# Patient Record
Sex: Female | Born: 1955
Health system: Southern US, Community
[De-identification: ages and names within clinical notes are randomized; demographics above are authoritative.]

## PROBLEM LIST (undated history)

## (undated) DIAGNOSIS — R0600 Dyspnea, unspecified: Secondary | ICD-10-CM

## (undated) DIAGNOSIS — T7840XA Allergy, unspecified, initial encounter: Secondary | ICD-10-CM

## (undated) DIAGNOSIS — Z5189 Encounter for other specified aftercare: Secondary | ICD-10-CM

## (undated) DIAGNOSIS — K219 Gastro-esophageal reflux disease without esophagitis: Secondary | ICD-10-CM

## (undated) DIAGNOSIS — J189 Pneumonia, unspecified organism: Secondary | ICD-10-CM

## (undated) DIAGNOSIS — H269 Unspecified cataract: Secondary | ICD-10-CM

## (undated) DIAGNOSIS — K529 Noninfective gastroenteritis and colitis, unspecified: Secondary | ICD-10-CM

## (undated) DIAGNOSIS — Z932 Ileostomy status: Secondary | ICD-10-CM

## (undated) DIAGNOSIS — L719 Rosacea, unspecified: Secondary | ICD-10-CM

## (undated) DIAGNOSIS — E119 Type 2 diabetes mellitus without complications: Secondary | ICD-10-CM

## (undated) DIAGNOSIS — E669 Obesity, unspecified: Secondary | ICD-10-CM

## (undated) DIAGNOSIS — I340 Nonrheumatic mitral (valve) insufficiency: Secondary | ICD-10-CM

## (undated) DIAGNOSIS — G4730 Sleep apnea, unspecified: Secondary | ICD-10-CM

## (undated) DIAGNOSIS — G4733 Obstructive sleep apnea (adult) (pediatric): Secondary | ICD-10-CM

## (undated) DIAGNOSIS — D649 Anemia, unspecified: Secondary | ICD-10-CM

## (undated) DIAGNOSIS — R739 Hyperglycemia, unspecified: Secondary | ICD-10-CM

## (undated) DIAGNOSIS — K7460 Unspecified cirrhosis of liver: Secondary | ICD-10-CM

## (undated) DIAGNOSIS — M1990 Unspecified osteoarthritis, unspecified site: Secondary | ICD-10-CM

## (undated) DIAGNOSIS — I10 Essential (primary) hypertension: Secondary | ICD-10-CM

## (undated) DIAGNOSIS — K8020 Calculus of gallbladder without cholecystitis without obstruction: Secondary | ICD-10-CM

## (undated) DIAGNOSIS — K56609 Unspecified intestinal obstruction, unspecified as to partial versus complete obstruction: Secondary | ICD-10-CM

## (undated) DIAGNOSIS — N189 Chronic kidney disease, unspecified: Secondary | ICD-10-CM

## (undated) DIAGNOSIS — Z87442 Personal history of urinary calculi: Secondary | ICD-10-CM

## (undated) HISTORY — DX: Essential (primary) hypertension: I10

## (undated) HISTORY — DX: Obesity, unspecified: E66.9

## (undated) HISTORY — DX: Noninfective gastroenteritis and colitis, unspecified: K52.9

## (undated) HISTORY — DX: Allergy, unspecified, initial encounter: T78.40XA

## (undated) HISTORY — DX: Rosacea, unspecified: L71.9

## (undated) HISTORY — DX: Encounter for other specified aftercare: Z51.89

## (undated) HISTORY — PX: APPENDECTOMY: SHX54

## (undated) HISTORY — DX: Calculus of gallbladder without cholecystitis without obstruction: K80.20

## (undated) HISTORY — DX: Hyperglycemia, unspecified: R73.9

## (undated) HISTORY — DX: Unspecified intestinal obstruction, unspecified as to partial versus complete obstruction: K56.609

## (undated) HISTORY — PX: CHOLECYSTECTOMY: SHX55

## (undated) HISTORY — DX: Unspecified osteoarthritis, unspecified site: M19.90

## (undated) HISTORY — DX: Unspecified cataract: H26.9

## (undated) HISTORY — PX: SPINE SURGERY: SHX786

---

## 1991-01-24 HISTORY — PX: COLECTOMY: SHX59

## 1991-08-24 HISTORY — PX: COLONOSCOPY: SHX174

## 1999-01-11 ENCOUNTER — Other Ambulatory Visit: Admission: RE | Admit: 1999-01-11 | Discharge: 1999-01-11 | Payer: Self-pay | Admitting: Family Medicine

## 1999-02-17 ENCOUNTER — Encounter: Payer: Self-pay | Admitting: Family Medicine

## 1999-02-17 ENCOUNTER — Encounter: Admission: RE | Admit: 1999-02-17 | Discharge: 1999-02-17 | Payer: Self-pay | Admitting: Family Medicine

## 2000-08-07 ENCOUNTER — Encounter: Admission: RE | Admit: 2000-08-07 | Discharge: 2000-08-07 | Payer: Self-pay | Admitting: Family Medicine

## 2000-08-07 ENCOUNTER — Encounter: Payer: Self-pay | Admitting: Family Medicine

## 2000-09-06 ENCOUNTER — Other Ambulatory Visit: Admission: RE | Admit: 2000-09-06 | Discharge: 2000-09-06 | Payer: Self-pay | Admitting: Family Medicine

## 2003-03-12 ENCOUNTER — Other Ambulatory Visit: Admission: RE | Admit: 2003-03-12 | Discharge: 2003-03-12 | Payer: Self-pay | Admitting: Family Medicine

## 2003-06-11 ENCOUNTER — Other Ambulatory Visit: Admission: RE | Admit: 2003-06-11 | Discharge: 2003-06-11 | Payer: Self-pay | Admitting: Family Medicine

## 2004-01-22 ENCOUNTER — Ambulatory Visit: Payer: Self-pay | Admitting: Internal Medicine

## 2004-06-30 ENCOUNTER — Ambulatory Visit: Payer: Self-pay | Admitting: Family Medicine

## 2004-06-30 ENCOUNTER — Other Ambulatory Visit: Admission: RE | Admit: 2004-06-30 | Discharge: 2004-06-30 | Payer: Self-pay | Admitting: Family Medicine

## 2005-04-27 ENCOUNTER — Other Ambulatory Visit: Admission: RE | Admit: 2005-04-27 | Discharge: 2005-04-27 | Payer: Self-pay | Admitting: Family Medicine

## 2005-04-27 ENCOUNTER — Encounter (INDEPENDENT_AMBULATORY_CARE_PROVIDER_SITE_OTHER): Payer: Self-pay | Admitting: Specialist

## 2005-04-27 ENCOUNTER — Ambulatory Visit: Payer: Self-pay | Admitting: Family Medicine

## 2005-05-11 ENCOUNTER — Ambulatory Visit: Payer: Self-pay | Admitting: Family Medicine

## 2006-01-23 DIAGNOSIS — K8020 Calculus of gallbladder without cholecystitis without obstruction: Secondary | ICD-10-CM

## 2006-01-23 HISTORY — DX: Calculus of gallbladder without cholecystitis without obstruction: K80.20

## 2006-02-06 ENCOUNTER — Ambulatory Visit: Payer: Self-pay | Admitting: Family Medicine

## 2006-02-06 LAB — CONVERTED CEMR LAB
Albumin: 3.7 g/dL (ref 3.5–5.2)
BUN: 8 mg/dL (ref 6–23)
Basophils Absolute: 0.2 10*3/uL — ABNORMAL HIGH (ref 0.0–0.1)
Basophils Relative: 2 % — ABNORMAL HIGH (ref 0.0–1.0)
CO2: 26 meq/L (ref 19–32)
Calcium: 9.4 mg/dL (ref 8.4–10.5)
Chloride: 106 meq/L (ref 96–112)
Creatinine, Ser: 1.2 mg/dL (ref 0.4–1.2)
Eosinophils Relative: 5.5 % — ABNORMAL HIGH (ref 0.0–5.0)
GFR calc Af Amer: 61 mL/min
GFR calc non Af Amer: 50 mL/min
Glucose, Bld: 120 mg/dL — ABNORMAL HIGH (ref 70–99)
HCT: 41.6 % (ref 36.0–46.0)
Hemoglobin: 14.3 g/dL (ref 12.0–15.0)
Lymphocytes Relative: 17.6 % (ref 12.0–46.0)
MCHC: 34.5 g/dL (ref 30.0–36.0)
MCV: 92.5 fL (ref 78.0–100.0)
Monocytes Absolute: 0.3 10*3/uL (ref 0.2–0.7)
Monocytes Relative: 3.7 % (ref 3.0–11.0)
Neutro Abs: 5.8 10*3/uL (ref 1.4–7.7)
Neutrophils Relative %: 71.2 % (ref 43.0–77.0)
Phosphorus: 4.4 mg/dL (ref 2.3–4.6)
Platelets: 311 10*3/uL (ref 150–400)
Potassium: 4.2 meq/L (ref 3.5–5.1)
RBC: 4.5 M/uL (ref 3.87–5.11)
RDW: 12.2 % (ref 11.5–14.6)
Sodium: 139 meq/L (ref 135–145)
TSH: 1.65 microintl units/mL (ref 0.35–5.50)
WBC: 8.1 10*3/uL (ref 4.5–10.5)

## 2006-04-06 ENCOUNTER — Other Ambulatory Visit: Admission: RE | Admit: 2006-04-06 | Discharge: 2006-04-06 | Payer: Self-pay | Admitting: Family Medicine

## 2006-04-06 ENCOUNTER — Ambulatory Visit: Payer: Self-pay | Admitting: Family Medicine

## 2006-04-06 ENCOUNTER — Encounter (INDEPENDENT_AMBULATORY_CARE_PROVIDER_SITE_OTHER): Payer: Self-pay | Admitting: Specialist

## 2006-04-06 LAB — CONVERTED CEMR LAB: Pap Smear: NORMAL

## 2006-05-24 HISTORY — PX: TOTAL ABDOMINAL HYSTERECTOMY: SHX209

## 2006-06-02 ENCOUNTER — Emergency Department: Payer: Self-pay | Admitting: Emergency Medicine

## 2006-06-04 ENCOUNTER — Observation Stay: Payer: Self-pay

## 2006-06-04 ENCOUNTER — Encounter: Payer: Self-pay | Admitting: Family Medicine

## 2006-07-10 ENCOUNTER — Encounter: Payer: Self-pay | Admitting: Family Medicine

## 2006-07-12 ENCOUNTER — Telehealth: Payer: Self-pay | Admitting: Family Medicine

## 2006-07-12 DIAGNOSIS — K5190 Ulcerative colitis, unspecified, without complications: Secondary | ICD-10-CM | POA: Insufficient documentation

## 2006-08-06 ENCOUNTER — Ambulatory Visit: Payer: Self-pay | Admitting: Gastroenterology

## 2006-08-10 ENCOUNTER — Ambulatory Visit (HOSPITAL_COMMUNITY): Admission: RE | Admit: 2006-08-10 | Discharge: 2006-08-10 | Payer: Self-pay | Admitting: Gastroenterology

## 2006-11-30 ENCOUNTER — Telehealth: Payer: Self-pay | Admitting: Family Medicine

## 2006-12-12 ENCOUNTER — Encounter: Payer: Self-pay | Admitting: Family Medicine

## 2006-12-12 DIAGNOSIS — N9489 Other specified conditions associated with female genital organs and menstrual cycle: Secondary | ICD-10-CM

## 2006-12-13 ENCOUNTER — Ambulatory Visit: Payer: Self-pay | Admitting: Family Medicine

## 2006-12-13 DIAGNOSIS — G4733 Obstructive sleep apnea (adult) (pediatric): Secondary | ICD-10-CM | POA: Insufficient documentation

## 2006-12-13 DIAGNOSIS — E669 Obesity, unspecified: Secondary | ICD-10-CM

## 2007-02-18 ENCOUNTER — Ambulatory Visit: Payer: Self-pay | Admitting: Gastroenterology

## 2007-03-08 ENCOUNTER — Telehealth: Payer: Self-pay | Admitting: Family Medicine

## 2007-05-15 DIAGNOSIS — I10 Essential (primary) hypertension: Secondary | ICD-10-CM

## 2007-07-29 ENCOUNTER — Telehealth: Payer: Self-pay | Admitting: Family Medicine

## 2007-08-13 ENCOUNTER — Ambulatory Visit: Payer: Self-pay | Admitting: Gastroenterology

## 2007-08-20 ENCOUNTER — Ambulatory Visit: Payer: Self-pay | Admitting: Gastroenterology

## 2007-08-21 ENCOUNTER — Encounter: Payer: Self-pay | Admitting: Family Medicine

## 2007-09-12 ENCOUNTER — Ambulatory Visit: Payer: Self-pay | Admitting: Family Medicine

## 2007-09-12 DIAGNOSIS — L719 Rosacea, unspecified: Secondary | ICD-10-CM | POA: Insufficient documentation

## 2008-09-08 ENCOUNTER — Ambulatory Visit: Payer: Self-pay | Admitting: Family Medicine

## 2008-09-08 DIAGNOSIS — Z9189 Other specified personal risk factors, not elsewhere classified: Secondary | ICD-10-CM

## 2008-09-08 LAB — CONVERTED CEMR LAB
Bilirubin Urine: NEGATIVE
Casts: 0 /lpf
Glucose, Urine, Semiquant: NEGATIVE
Protein, U semiquant: NEGATIVE
Specific Gravity, Urine: 1.01
Urobilinogen, UA: 0.2
WBC, UA: 0 cells/hpf
Yeast, UA: 0
pH: 6

## 2008-09-09 ENCOUNTER — Encounter: Payer: Self-pay | Admitting: Family Medicine

## 2008-09-11 ENCOUNTER — Ambulatory Visit: Payer: Self-pay | Admitting: Cardiology

## 2008-09-14 ENCOUNTER — Telehealth: Payer: Self-pay | Admitting: Family Medicine

## 2008-10-12 ENCOUNTER — Ambulatory Visit: Payer: Self-pay | Admitting: Family Medicine

## 2008-10-13 LAB — CONVERTED CEMR LAB
Albumin: 3.4 g/dL — ABNORMAL LOW (ref 3.5–5.2)
Alkaline Phosphatase: 65 units/L (ref 39–117)
BUN: 9 mg/dL (ref 6–23)
Basophils Absolute: 0 10*3/uL (ref 0.0–0.1)
CO2: 27 meq/L (ref 19–32)
Calcium: 8.7 mg/dL (ref 8.4–10.5)
Cholesterol: 168 mg/dL (ref 0–200)
Eosinophils Relative: 10.4 % — ABNORMAL HIGH (ref 0.0–5.0)
GFR calc non Af Amer: 61.33 mL/min (ref >60)
Glucose, Bld: 112 mg/dL — ABNORMAL HIGH (ref 70–99)
HCT: 44 % (ref 36.0–46.0)
HDL: 35.7 mg/dL — ABNORMAL LOW (ref >39.00)
Hemoglobin: 14.8 g/dL (ref 12.0–15.0)
Lymphocytes Relative: 21.8 % (ref 12.0–46.0)
Lymphs Abs: 1.5 10*3/uL (ref 0.7–4.0)
Monocytes Relative: 7 % (ref 3.0–12.0)
Neutro Abs: 4.1 10*3/uL (ref 1.4–7.7)
Platelets: 245 10*3/uL (ref 150.0–400.0)
RDW: 12.9 % (ref 11.5–14.6)
TSH: 1.64 microintl units/mL (ref 0.35–5.50)
Total Protein: 6.7 g/dL (ref 6.0–8.3)
Triglycerides: 159 mg/dL — ABNORMAL HIGH (ref 0.0–149.0)
VLDL: 31.8 mg/dL (ref 0.0–40.0)
WBC: 6.8 10*3/uL (ref 4.5–10.5)

## 2008-10-16 ENCOUNTER — Ambulatory Visit: Payer: Self-pay | Admitting: Family Medicine

## 2008-10-29 ENCOUNTER — Encounter: Payer: Self-pay | Admitting: Family Medicine

## 2008-11-16 ENCOUNTER — Encounter: Payer: Self-pay | Admitting: Family Medicine

## 2008-11-16 LAB — HM MAMMOGRAPHY: HM Mammogram: NORMAL

## 2009-04-16 ENCOUNTER — Ambulatory Visit: Payer: Self-pay | Admitting: Family Medicine

## 2009-04-23 ENCOUNTER — Ambulatory Visit: Payer: Self-pay | Admitting: Family Medicine

## 2009-04-23 DIAGNOSIS — E1129 Type 2 diabetes mellitus with other diabetic kidney complication: Secondary | ICD-10-CM | POA: Insufficient documentation

## 2009-04-23 DIAGNOSIS — E119 Type 2 diabetes mellitus without complications: Secondary | ICD-10-CM

## 2009-05-28 ENCOUNTER — Ambulatory Visit: Payer: Self-pay | Admitting: Family Medicine

## 2009-05-28 ENCOUNTER — Encounter: Payer: Self-pay | Admitting: Family Medicine

## 2009-06-07 ENCOUNTER — Encounter: Payer: Self-pay | Admitting: Family Medicine

## 2009-07-07 ENCOUNTER — Encounter: Payer: Self-pay | Admitting: Family Medicine

## 2009-07-13 ENCOUNTER — Ambulatory Visit: Payer: Self-pay | Admitting: Family Medicine

## 2009-07-14 LAB — CONVERTED CEMR LAB
Chloride: 109 meq/L (ref 96–112)
Creatinine,U: 288 mg/dL
Glucose, Bld: 126 mg/dL — ABNORMAL HIGH (ref 70–99)
Hgb A1c MFr Bld: 6.1 % (ref 4.6–6.5)
Microalb, Ur: 1 mg/dL (ref 0.0–1.9)
Phosphorus: 4.2 mg/dL (ref 2.3–4.6)
Potassium: 4.8 meq/L (ref 3.5–5.1)
Sodium: 142 meq/L (ref 135–145)

## 2009-07-21 ENCOUNTER — Ambulatory Visit: Payer: Self-pay | Admitting: Family Medicine

## 2009-07-21 DIAGNOSIS — R490 Dysphonia: Secondary | ICD-10-CM | POA: Insufficient documentation

## 2009-07-27 ENCOUNTER — Telehealth: Payer: Self-pay | Admitting: Family Medicine

## 2009-07-30 ENCOUNTER — Telehealth: Payer: Self-pay | Admitting: Family Medicine

## 2009-09-02 ENCOUNTER — Telehealth: Payer: Self-pay | Admitting: Family Medicine

## 2009-09-02 ENCOUNTER — Encounter: Payer: Self-pay | Admitting: Family Medicine

## 2009-09-23 ENCOUNTER — Telehealth: Payer: Self-pay | Admitting: Family Medicine

## 2009-10-07 ENCOUNTER — Ambulatory Visit: Payer: Self-pay | Admitting: Family Medicine

## 2009-10-08 LAB — CONVERTED CEMR LAB
BUN: 14 mg/dL (ref 6–23)
Calcium: 9 mg/dL (ref 8.4–10.5)
Creatinine, Ser: 0.9 mg/dL (ref 0.4–1.2)
GFR calc non Af Amer: 73.71 mL/min (ref >60)
Glucose, Bld: 111 mg/dL — ABNORMAL HIGH (ref 70–99)
Phosphorus: 3.7 mg/dL (ref 2.3–4.6)

## 2009-10-14 ENCOUNTER — Ambulatory Visit: Payer: Self-pay | Admitting: Family Medicine

## 2009-10-14 DIAGNOSIS — M25569 Pain in unspecified knee: Secondary | ICD-10-CM | POA: Insufficient documentation

## 2009-11-11 ENCOUNTER — Encounter: Payer: Self-pay | Admitting: Family Medicine

## 2010-01-07 ENCOUNTER — Telehealth: Payer: Self-pay | Admitting: Family Medicine

## 2010-02-22 NOTE — Assessment & Plan Note (Signed)
Summary: ROA FOR 3 MONTH FOLLOW-UP/JRR   Vital Signs:  Patient profile:   55 year old female Height:      66 inches Weight:      239.25 pounds BMI:     38.76 Temp:     97.9 degrees F oral Pulse rate:   76 / minute Pulse rhythm:   regular BP sitting:   136 / 78  (left arm) Cuff size:   large  Vitals Entered By: Ozzie Hoyle LPN (July 22, 2438 10:27 AM) CC: follow-up visit   History of Present Illness: here for f/u of mild dm/ hyperglycemia   wt is down 5 lb has cut out a lot of sugar  changed to sugar free hard candies , no sweets at all (doing well with that )  watching portions carefully  is hard to get rid of salty and crunchy things , has switched to whole grain items and watching portions  has some work to do still   daily sugars fluctuate fasting - usually low 100s under 120  pm- pp sugars fluctuate more -- but for the most part under 140  learning what foods make it higher   bp 136/78 today is not on ace yet   this lab draw gluc 126 and AIC is 6.1  microalb neg  [facility]   has not started exercise yet  is more active in general but has not started a program  wants to walk and has a wi fit   takes hyomax -- for signs of blockage  this helps with lying down when needed  needs refil of this - and does not see GI on any particular schedule  Last Lipid ProfileCholesterol: 168 (10/12/2008 10:03:48 AM)HDL:  35.70 (10/12/2008 10:03:48 AM)LDL:  101 (10/12/2008 10:03:48 AM)Triglycerides:  Last Liver profileSGOT:  35 (10/12/2008 10:03:48 AM)SPGT:  37 (10/12/2008 10:03:48 AM)T. Bili:  1.1 (10/12/2008 10:03:48 AM)Alk Phos:  65 (10/12/2008 10:03:48 AM)  overall not bad lipids at last check  some recent hoarse voice  has gerd -- takes prilosec as needed -- not daily  occ post nasal drip  does sing in a choir - but not this season   Allergies (verified): No Known Drug Allergies  Past History:  Past Medical History: Last updated:  10/16/2008 Arthritis Gallstones hx of colitis with colectomy Hypertension Obesity repeated bowel obstruction (? poss from adhesions)- neg EGD rosacea mild hyperglycemia   Past Surgical History: Last updated: 10/16/2008 Dexa- normal 901/2001) Admit- gyn, ovarian cysts (05/2006) CT- incidenatal lesion right breast Hysterectomy (05/2006)- total for abcessed ovaries  Colectomy 1993 8/10 ct abd /pelvis -- kidney stone in L kidney, gallstones EGD 09 - normal  Family History: Last updated: 10/16/2008 Father: colon cancer, liver resection secondary to mets, chol, HTN, allergies, chol  Mother: HTN, chol, rare liver problem- elevated enzymes, died of NASH cirrhosis in 03-29-06, DM, chol  Siblings: 1 brother with allergies  Social History: Last updated: 09/12/2007 Marital Status: Married Children: 3 sons 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  Alcohol Use - no Daily Caffeine Use- one soda at lunch  Illicit Drug Use - no Patient does not get regular exercise.   Risk Factors: Exercise: no (08/13/2007)  Risk Factors: Smoking Status: never (08/13/2007)  Review of Systems General:  Denies fatigue, loss of appetite, sleep disorder, and sweats. Eyes:  Denies blurring and eye irritation. ENT:  Complains of hoarseness. CV:  Denies chest pain or discomfort and palpitations. Resp:  Denies cough and wheezing.  GI:  Denies abdominal pain, bloody stools, change in bowel habits, and indigestion. MS:  Denies joint pain and cramps. Derm:  Denies itching, lesion(s), poor wound healing, and rash. Neuro:  Denies numbness and tingling. Psych:  Denies anxiety and depression. Endo:  Denies cold intolerance, excessive thirst, excessive urination, and heat intolerance. Heme:  Denies abnormal bruising and bleeding.  Physical Exam  General:  overweight but generally well appearing  Head:  normocephalic, atraumatic, and no abnormalities observed.   Eyes:  vision grossly intact,  pupils equal, pupils round, and pupils reactive to light.   Nose:  no nasal discharge.   Mouth:  pharynx pink and moist.   mild post nasal drip Neck:  supple with full rom and no masses or thyromegally, no JVD or carotid bruit  Lungs:  Normal respiratory effort, chest expands symmetrically. Lungs are clear to auscultation, no crackles or wheezes. Heart:  Normal rate and regular rhythm. S1 and S2 normal without gallop, murmur, click, rub or other extra sounds. Abdomen:  Bowel sounds positive,abdomen soft and non-tender without masses, organomegaly or hernias noted. no renal bruits  Msk:  No deformity or scoliosis noted of thoracic or lumbar spine.  no acute joint changes  Pulses:  R and L carotid,radial,femoral,dorsalis pedis and posterior tibial pulses are full and equal bilaterally Extremities:  No clubbing, cyanosis, edema, or deformity noted with normal full range of motion of all joints.   Neurologic:  sensation intact to light touch, gait normal, and DTRs symmetrical and normal.   Skin:  Intact without suspicious lesions or rashes Cervical Nodes:  No lymphadenopathy noted Psych:  normal affect, talkative and pleasant   Diabetes Management Exam:    Foot Exam (with socks and/or shoes not present):       Sensory-Pinprick/Light touch:          Left medial foot (L-4): normal          Left dorsal foot (L-5): normal          Left lateral foot (S-1): normal          Right medial foot (L-4): normal          Right dorsal foot (L-5): normal          Right lateral foot (S-1): normal       Sensory-Monofilament:          Left foot: normal          Right foot: normal       Inspection:          Left foot: normal          Right foot: normal       Nails:          Left foot: normal          Right foot: normal   Impression & Recommendations:  Problem # 1:  DIABETES MELLITUS, TYPE II, MILD (ICD-250.00) Assessment Improved  improving with wt loss and better diet  rev sugars as well as  records from DM classes  disc plan now for exercise  will keep up the good work and re check in 3 mo with labs and f/u add ace today for renal protection Her updated medication list for this problem includes:    Lisinopril 5 Mg Tabs (Lisinopril) .Marland Kitchen... 1 by mouth once daily in am  Labs Reviewed: Creat: 1.0 (07/13/2009)     Last Eye Exam: normal (11/23/2008) Reviewed HgBA1c results: 6.1 (07/13/2009)  6.2 (04/16/2009)  Orders: Prescription Created  Electronically [phone])  Problem # 2:  HYPERTENSION (ICD-401.9) Assessment: Unchanged  still a bit high - disc minimizing otc meds that raise bp (ie : D products and nsaids)  will add ace low dose  lab and f/u in 3 mo adv to keep working on wt loss and start exercise  Her updated medication list for this problem includes:    Metoprolol Tartrate 25 Mg Tabs (Metoprolol tartrate) .Marland Kitchen... Take one by mouth daily    Lisinopril 5 Mg Tabs (Lisinopril) .Marland Kitchen... 1 by mouth once daily in am  BP today: 136/78 Prior BP: 135/80 (04/23/2009)  Labs Reviewed: K+: 4.8 (07/13/2009) Creat: : 1.0 (07/13/2009)   Chol: 168 (10/12/2008)   HDL: 35.70 (10/12/2008)   LDL: 101 (10/12/2008)   TG: 159.0 (10/12/2008)  Orders: Prescription Created Electronically [phone])  Problem # 3:  COLITIS, ULCERATIVE NOS (ICD-556.9) Assessment: Unchanged  with occ cramps and bowel obst  refil hyomax today- update if worse   Orders: Prescription Created Electronically [phone])  Problem # 4:  HOARSENESS (TIR-443.15) Assessment: New  disc diff dx of this incl post nasal drip /' voice overuse and vocal polyps  most likely rel to gerd  asked pt to inc prilosec to 20 mg daily if not imp in 2 weeks needs to call back for ENT consult for further eval   Orders: Prescription Created Electronically [phone])  Complete Medication List: 1)  Ambien 10 Mg Tabs (Zolpidem tartrate) .... 1/2 to 1 by mouth at bedtime as needed 2)  Flovent Hfa 220 Mcg/act Aero (Fluticasone propionate   hfa) .... As directed 3)  Metoprolol Tartrate 25 Mg Tabs (Metoprolol tartrate) .... Take one by mouth daily 4)  Aleve 220 Mg Tabs (Naproxen sodium) .... One by mouth once daily 5)  Hyomax-sl 0.125 Mg Subl (Hyoscyamine sulfate) .... Take as needed 1 sublingual for abdominal cramps 6)  Mucinex D 60-600 Mg Xr12h-tab (Pseudoephedrine-guaifenesin) .... Otc as directed. 7)  Glucometer  .... To check sugar once daily and as needed for dm2   250.0 8)  Glucose Test Strips  .... To check sugar once daily and as needed for dm2   250.0 9)  Cinnamon 500 Mg Tabs (Cinnamon) .... Take 2 tablets  by mouth once a day 10)  Lisinopril 5 Mg Tabs (Lisinopril) .Marland Kitchen.. 1 by mouth once daily in am 11)  Prilosec Otc 20 Mg Tbec (Omeprazole magnesium) .Marland Kitchen.. 1 by mouth once daily  Patient Instructions: 1)  start taking prilosec 20 mg every day -- and if hoarseness is not better in 2 weeks call for ENT consult  2)  start lisinopril- if cough or any other side effect- stop it and call  3)  keep working hard on diet and exercise  4)  schedule lab and follow up in 3 months -- AIC and renal 250.0 then follow up  5)  I sent px to your pharmacy Prescriptions: LISINOPRIL 5 MG TABS (LISINOPRIL) 1 by mouth once daily in am  #30 x 11   Entered and Authorized by:   Allena Earing MD   Signed by:   Allena Earing MD on 07/21/2009   Method used:   Electronically to        [facility]. # [phone]* (retail)       [address]       [place], [place]  [address]       Ph: [phone]       Fax:  [phone]   RxID:   [id number] HYOMAX-SL 0.125 MG  SUBL (HYOSCYAMINE SULFATE) take as needed 1 sublingual for abdominal cramps  #30 x 3   Entered and Authorized by:   Allena Earing MD   Signed by:   Allena Earing MD on 07/21/2009   Method used:   Electronically to        [facility]. # [id number]* (retail)       [address]       [place], [place]  [address]       Ph: [phone]       Fax:  [phone]   RxID:   [id number]   Current Allergies (reviewed today): No known allergies

## 2010-02-22 NOTE — Consult Note (Signed)
Summary: [place]   Imported By: Edmonia James 06/04/2009 12:33:06  _____________________________________________________________________  External Attachment:    Type:   Image     Comment:   External Document

## 2010-02-22 NOTE — Progress Notes (Signed)
Summary: ? About BP med  Phone Note Call from Patient Call back at [phone]   Caller: Patient Call For: Allena Earing MD Summary of Call: Patient started Lisinopril last week and is having symtoms. Patient states that she took it for 2 days and she was completely wiped out and stomach was hurting. Patient took her BP Friday night and it was 101/52. Patient stated that she started cutting the pill in half on Saturday and is doing a little better, but not a lot. Patient took her BP yesterday and it was 124/77. Patient states that she is still wiped out and stomach hurts with being on 1/2 of the pill. Please advise. Pharmacy/[facility]. Initial call taken by: Emelia Salisbury LPN,  July 27, 9572 7:34 PM  Follow-up for Phone Call        stop med for 3 days then call back with how she is feeling- then will make a plan Follow-up by: Allena Earing MD,  July 27, 2009 4:38 PM  Additional Follow-up for Phone Call Additional follow up Details #1::        Patient notified as instructed by telephone. Ozzie Hoyle LPN  July 27, 368 9:64 PM     New/Updated Medications: LISINOPRIL 5 MG TABS (LISINOPRIL) 1 by mouth once daily in am (holding for stomach ache and fatigue july 5)

## 2010-02-22 NOTE — Progress Notes (Signed)
Summary: lisinopril  Phone Note Call from Patient Call back at [phone]   Caller: Patient Call For: Allena Earing MD Summary of Call: Patient called to let you know that after stopping the Lisinopril for 3 days, she feels great and all the symptoms that she was having are gone. She is asking what she should do now?  Initial call taken by: Lacretia Nicks,  July 30, 2009 2:53 PM  Follow-up for Phone Call        I'm going to hold off on any more med until I see her for next f/u I'm glad she is feeling better  Follow-up by: Allena Earing MD,  July 30, 2009 3:17 PM  Additional Follow-up for Phone Call Additional follow up Details #1::        Patient notified as instructed by telephone. Ozzie Hoyle LPN  July 31, 9526 4:13 PM    New Allergies: ! * LISINOPRIL New Allergies: ! * LISINOPRIL

## 2010-02-22 NOTE — Miscellaneous (Signed)
Summary: Flu vaccine   Clinical Lists Changes  Observations: Added new observation of FLU VAX: Historical by [facility] (11/04/2009 15:59)      Immunization History:  Influenza Immunization History:    Influenza:  historical by [facility] (11/04/2009)

## 2010-02-22 NOTE — Assessment & Plan Note (Signed)
Summary: ROA 3 MTHS F/ U ON LABS CYD   Vital Signs:  Patient profile:   55 year old female Height:      66 inches Weight:      239.50 pounds BMI:     38.80 Temp:     98 degrees F oral Pulse rate:   72 / minute Pulse rhythm:   regular BP sitting:   128 / 82  (left arm) Cuff size:   large  Vitals Entered By: Ozzie Hoyle LPN (October 15, 7670 8:15 AM) CC: three month f/u   History of Present Illness: here for f/u of hyperglycemia and HTN and hoarseness/ gerd  has been feeling ok overall  stomach still does not feel good 30 min or so after she eats -- is a heavy feeling  no cramping or signs of obstruction  no acid reflux  still take prilosec -- but not regularly she plans to watch this  no hx of celiac or lactose intolerance and no change from diff foods   renal panel shows gluc111 AIC is 6.2 up from 6.1 fasting avg 111 and 138 2 hours pp -- overall fairly good  did not monitor eating as carefully-- is eating less sweets / none  eats too many salty snacks , crunchy  occ peanuts and cheerios together   exercise - has not done  plan would be to walk  could walk in evenings -- 7 pm - for 1/2 hour 4 days per week   wt is stable   bp is better at 128/82- staying away from decongestants  still takes aleve once per day  knees and ankles hurt with stairs  ? if aleve bothers stomach  could she try something else   hoarseness got better then worse with a cold  sounds fine today    Allergies: 1)  ! * Lisinopril  Past History:  Past Medical History: Last updated: 10/16/2008 Arthritis Gallstones hx of colitis with colectomy Hypertension Obesity repeated bowel obstruction (? poss from adhesions)- neg EGD rosacea mild hyperglycemia   Past Surgical History: Last updated: 10/16/2008 Dexa- normal 901/2001) Admit- gyn, ovarian cysts (05/2006) CT- incidenatal lesion right breast Hysterectomy (05/2006)- total for abcessed ovaries  Colectomy 1993 8/10 ct abd  /pelvis -- kidney stone in L kidney, gallstones EGD 09 - normal  Family History: Last updated: 10/16/2008 Father: colon cancer, liver resection secondary to mets, chol, HTN, allergies, chol  Mother: HTN, chol, rare liver problem- elevated enzymes, died of NASH cirrhosis in 2006/03/24, DM, chol  Siblings: 1 brother with allergies  Social History: Last updated: 09/12/2007 Marital Status: Married Children: 3 sons 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  Alcohol Use - no Daily Caffeine Use- one soda at lunch  Illicit Drug Use - no Patient does not get regular exercise.   Risk Factors: Exercise: no (08/13/2007)  Risk Factors: Smoking Status: never (08/13/2007)  Review of Systems General:  Denies fatigue, loss of appetite, and malaise. Eyes:  Denies blurring and eye irritation. ENT:  Complains of hoarseness. CV:  Denies chest pain or discomfort, lightheadness, and palpitations. Resp:  Complains of cough. GI:  Complains of indigestion; denies change in bowel habits and nausea. GU:  Denies dysuria. Derm:  Denies itching, lesion(s), poor wound healing, and rash. Neuro:  Denies numbness and tingling. Psych:  Denies anxiety and depression. Endo:  Denies cold intolerance, excessive thirst, excessive urination, and heat intolerance.  Physical Exam  General:  Well-developed,well-nourished,in no acute distress; alert,appropriate and cooperative throughout  examination Head:  normocephalic, atraumatic, and no abnormalities observed.   Eyes:  vision grossly intact, pupils equal, pupils round, and pupils reactive to light.   Mouth:  pharynx pink and moist.   Neck:  supple with full rom and no masses or thyromegally, no JVD or carotid bruit  Chest Wall:  No deformities, masses, or tenderness noted. Lungs:  Normal respiratory effort, chest expands symmetrically. Lungs are clear to auscultation, no crackles or wheezes. Heart:  Normal rate and regular rhythm. S1 and S2 normal  without gallop, murmur, click, rub or other extra sounds. Abdomen:  Bowel sounds positive,abdomen soft and non-tender without masses, organomegaly or hernias noted. no renal bruits  Msk:  some pain on full rom of knees -- no swelling or eff or crepitice Pulses:  R and L carotid,radial,femoral,dorsalis pedis and posterior tibial pulses are full and equal bilaterally Extremities:  No clubbing, cyanosis, edema, or deformity noted with normal full range of motion of all joints.   Neurologic:  sensation intact to light touch, gait normal, and DTRs symmetrical and normal.   Skin:  Intact without suspicious lesions or rashes Cervical Nodes:  No lymphadenopathy noted Psych:  normal affect, talkative and pleasant   Diabetes Management Exam:    Foot Exam (with socks and/or shoes not present):       Sensory-Pinprick/Light touch:          Left medial foot (L-4): normal          Left dorsal foot (L-5): normal          Left lateral foot (S-1): normal          Right medial foot (L-4): normal          Right dorsal foot (L-5): normal          Right lateral foot (S-1): normal       Sensory-Monofilament:          Left foot: normal          Right foot: normal       Inspection:          Left foot: normal          Right foot: normal       Nails:          Left foot: normal          Right foot: normal   Impression & Recommendations:  Problem # 1:  HOARSENESS (KPQ-244.97) Assessment Improved  this is better with prilosec - urged her to take it daily and update me   Orders: Prescription Created Electronically [id number])  Problem # 2:  DIABETES MELLITUS, TYPE II, MILD (ICD-250.00) Assessment: Unchanged  this is stable -- hyperglycemia disc healthy diet (low simple sugar/ choose complex carbs/ low sat fat) diet and exercise in detail  plan for exercise made  lab 6 mo and f/u  Labs Reviewed: Creat: 0.9 (10/07/2009)     Last Eye Exam: normal (11/23/2008) Reviewed HgBA1c results: 6.2 (10/07/2009)   6.1 (07/13/2009)  Problem # 3:  OTHER SCREENING MAMMOGRAM (ICD-V76.12) Assessment: Comment Only schedule annual mam Orders: Radiology Referral (Radiology)  Problem # 4:  HYPERTENSION (ICD-401.9) Assessment: Improved  this is improved  no change in med ace intol Her updated medication list for this problem includes:    Metoprolol Tartrate 25 Mg Tabs (Metoprolol tartrate) .Marland Kitchen... Take one by mouth daily  BP today: 128/82 Prior BP: 136/78 (07/21/2009)  Labs Reviewed: K+: 4.5 (10/07/2009) Creat: : 0.9 (10/07/2009)   Chol: 168 (  10/12/2008)   HDL: 35.70 (10/12/2008)   LDL: 101 (10/12/2008)   TG: 159.0 (10/12/2008)  Orders: Prescription Created Electronically [phone])  Problem # 5:  KNEE PAIN (ZWC-585.27) Assessment: New  poss rel to obesity - disc wt loss trial of low dose mobic instead of aleve to see if easier on GI system The following medications were removed from the medication list:    Aleve 220 Mg Tabs (Naproxen sodium) ..... One by mouth once daily Her updated medication list for this problem includes:    Mobic 7.5 Mg Tabs (Meloxicam) .Marland Kitchen... 1 by mouth once daily with food as needed pain  Orders: Prescription Created Electronically [phone])  Complete Medication List: 1)  Ambien 10 Mg Tabs (Zolpidem tartrate) .... 1/2 to 1 by mouth at bedtime as needed 2)  Flovent Hfa 220 Mcg/act Aero (Fluticasone propionate  hfa) .... 2 sprays to colostomy site once daily as directed 3)  Metoprolol Tartrate 25 Mg Tabs (Metoprolol tartrate) .... Take one by mouth daily 4)  Hyomax-sl 0.125 Mg Subl (Hyoscyamine sulfate) .... Take as needed 1 sublingual for abdominal cramps 5)  Mucinex D 60-600 Mg Xr12h-tab (Pseudoephedrine-guaifenesin) .... Otc as directed. 6)  Glucometer  .... To check sugar once daily and as needed for dm2   250.0 7)  Glucose Test Strips  .... To check sugar once daily and as needed for dm2   250.0 8)  Cinnamon 500 Mg Tabs (Cinnamon) .... Take 2 tablets  by mouth once a  day 9)  Prilosec Otc 20 Mg Tbec (Omeprazole magnesium) .Marland Kitchen.. 1 by mouth once daily 10)  Mobic 7.5 Mg Tabs (Meloxicam) .Marland Kitchen.. 1 by mouth once daily with food as needed pain  Patient Instructions: 1)  sugar and blood pressure are stable  2)  work on weight loss- cut snacks and also add 30 minutes of walking 4 days per week in evening  3)  no change in medicine except try mobic instead of aleve  4)  prilosec daily -- keep track of stomach symptoms 5)  update me if hoarseness returns  6)  get flu shot at work  7)  schedule fasting labs in 6 months lipid/ast/alt/ AIC / renal hyperglycemia and 272 and 401.1 and then f/u  Prescriptions: MOBIC 7.5 MG TABS (MELOXICAM) 1 by mouth once daily with food as needed pain  #30 x 11   Entered and Authorized by:   Allena Earing MD   Signed by:   Allena Earing MD on 10/14/2009   Method used:   Electronically to        [facility]. # [phone]* (retail)       [address]       [place], [place]  [address]       Ph: [phone]       Fax: [phone]   RxID:   [id number]   Current Allergies (reviewed today): ! * LISINOPRIL

## 2010-02-22 NOTE — Assessment & Plan Note (Signed)
Summary: FOLLOWUP AFTER LABS/RI   Vital Signs:  Patient profile:   55 year old female Height:      66 inches Weight:      244.25 pounds BMI:     39.57 Temp:     98.4 degrees F oral Pulse rate:   80 / minute Pulse rhythm:   regular BP sitting:   135 / 80  (left arm) Cuff size:   large  Vitals Entered By: Ozzie Hoyle LPN (April 23, 3152 0:08 PM)  Serial Vital Signs/Assessments:  Time      Position  BP       Pulse  Resp  Temp     By 31                  135/80                         Allena Earing MD  CC: followup after labs   History of Present Illness: here for f/u after checking AIc  is feeling ok overall   hx of hyperglycemia and AIc is 6.2  wt is up 5 lb  doing miserably with diet  eating everything and not watching sugar got very stressed at work   since getting her labs -- is better   thinks the emotional eating is something she has control over    bp up today  146/94-- re check is better after rest -- 135/80  was on wt watchers   no numbness no frequencey of urination or inc thirst   opthy in nov - Dr Precious Bard -  no probs on dilated eye exam   Allergies (verified): No Known Drug Allergies  Past History:  Past Medical History: Last updated: 10/16/2008 Arthritis Gallstones hx of colitis with colectomy Hypertension Obesity repeated bowel obstruction (? poss from adhesions)- neg EGD rosacea mild hyperglycemia   Past Surgical History: Last updated: 10/16/2008 Dexa- normal 901/2001) Admit- gyn, ovarian cysts (05/2006) CT- incidenatal lesion right breast Hysterectomy (05/2006)- total for abcessed ovaries  Colectomy 1993 8/10 ct abd /pelvis -- kidney stone in L kidney, gallstones EGD 09 - normal  Family History: Last updated: 10/16/2008 Father: colon cancer, liver resection secondary to mets, chol, HTN, allergies, chol  Mother: HTN, chol, rare liver problem- elevated enzymes, died of NASH cirrhosis in 04/09/2006, DM, chol  Siblings: 1 brother with  allergies  Social History: Last updated: 09/12/2007 Marital Status: Married Children: 3 sons 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  Alcohol Use - no Daily Caffeine Use- one soda at lunch  Illicit Drug Use - no Patient does not get regular exercise.   Risk Factors: Exercise: no (08/13/2007)  Risk Factors: Smoking Status: never (08/13/2007)  Review of Systems General:  Denies fatigue, fever, loss of appetite, and malaise. Eyes:  Denies blurring and eye irritation. CV:  Denies chest pain or discomfort, lightheadness, palpitations, and shortness of breath with exertion. Resp:  Denies cough, shortness of breath, and wheezing. GI:  Denies change in bowel habits, indigestion, and nausea. GU:  Denies urinary frequency. MS:  Denies muscle aches. Derm:  Denies itching, lesion(s), poor wound healing, and rash. Neuro:  Denies numbness and tingling. Endo:  Denies cold intolerance, excessive thirst, excessive urination, and heat intolerance. Heme:  Denies abnormal bruising and bleeding.  Physical Exam  General:  overweight but generally well appearing  Head:  normocephalic, atraumatic, and no abnormalities observed.   Eyes:  vision grossly intact, pupils  equal, pupils round, and pupils reactive to light.  no conjunctival pallor, injection or icterus  Mouth:  pharynx pink and moist.   Neck:  supple with full rom and no masses or thyromegally, no JVD or carotid bruit  Lungs:  Normal respiratory effort, chest expands symmetrically. Lungs are clear to auscultation, no crackles or wheezes. Heart:  Normal rate and regular rhythm. S1 and S2 normal without gallop, murmur, click, rub or other extra sounds. Abdomen:  Bowel sounds positive,abdomen soft and non-tender without masses, organomegaly or hernias noted. no renal bruits  Msk:  No deformity or scoliosis noted of thoracic or lumbar spine.  no acute joint changes  Pulses:  R and L carotid,radial,femoral,dorsalis pedis and  posterior tibial pulses are full and equal bilaterally Extremities:  No clubbing, cyanosis, edema, or deformity noted with normal full range of motion of all joints.   Neurologic:  sensation intact to light touch, gait normal, and DTRs symmetrical and normal.   Skin:  Intact without suspicious lesions or rashes Cervical Nodes:  No lymphadenopathy noted Axillary Nodes:  No palpable lymphadenopathy Psych:  normal affect, talkative and pleasant   Diabetes Management Exam:    Foot Exam (with socks and/or shoes not present):       Sensory-Pinprick/Light touch:          Left medial foot (L-4): normal          Left dorsal foot (L-5): normal          Left lateral foot (S-1): normal          Right medial foot (L-4): normal          Right dorsal foot (L-5): normal          Right lateral foot (S-1): normal       Sensory-Monofilament:          Left foot: normal          Right foot: normal       Inspection:          Left foot: normal          Right foot: normal       Nails:          Left foot: normal          Right foot: normal    Eye Exam:       Eye Exam done elsewhere          Date: 11/23/2008          Results: normal          Done by: Dr Chip Boer   Impression & Recommendations:  Problem # 1:  DIABETES MELLITUS, TYPE II, MILD (ICD-250.00) Assessment New disc imp of diet/exercise/wt loss handouts given from [facility] on DM  ref to Dm teaching px for equiptment to check glucose once daily f/u after labs 3 mo  opthy up to date disc foot care  will need to work on tighter bp control if not imp next time- disc goals for HTN and lipids Orders: Diabetic Clinic Referral (Diabetic)  Problem # 2:  HYPERTENSION (ICD-401.9) Assessment: Unchanged bp ok but not at goal for DM will likely add ace at next visit for this and renal protection Her updated medication list for this problem includes:    Metoprolol Tartrate 25 Mg Tabs (Metoprolol tartrate) .Marland Kitchen... Take one by mouth daily  BP today:  135/80 Prior BP: 120/82 (10/16/2008)  Labs Reviewed: K+: 4.3 (10/12/2008) Creat: : 1.0 (10/12/2008)   Chol:  168 (10/12/2008)   HDL: 35.70 (10/12/2008)   LDL: 101 (10/12/2008)   TG: 159.0 (10/12/2008)  Complete Medication List: 1)  Ambien 10 Mg Tabs (Zolpidem tartrate) .... 1/2 to 1 by mouth at bedtime prn 2)  Flovent Hfa 220 Mcg/act Aero (Fluticasone propionate  hfa) .... As directed 3)  Metoprolol Tartrate 25 Mg Tabs (Metoprolol tartrate) .... Take one by mouth daily 4)  Aleve 220 Mg Tabs (Naproxen sodium) .... One by mouth once daily 5)  Hyomax-sl 0.125 Mg Subl (Hyoscyamine sulfate) .... As needed 6)  Mucinex D 60-600 Mg Xr12h-tab (Pseudoephedrine-guaifenesin) .... Otc as directed. 7)  Glucometer  .... To check sugar once daily and as needed for dm2   250.0 8)  Glucose Test Strips  .... To check sugar once daily and as needed for dm2   250.0  Patient Instructions: 1)  we will refer you to diabetec teaching at check out  2)  hold on to your px for test equiptment until your first class  3)  please watch diet closely for simple sugars and sweet drinks  4)  eat brown high fiber items instead of white ones  5)  small carbohydrate portions with lots of green vegetables and lean protien  6)  exercise as you can  7)  schedule non fasting labs for AIC , microalbumin and renal 250.0 and then follow up  Prescriptions: GLUCOSE TEST STRIPS to check sugar once daily and as needed for DM2   250.0  #100 each x 3   Entered and Authorized by:   Allena Earing MD   Signed by:   Allena Earing MD on 04/23/2009   Method used:   Print then Give to Patient   RxID:   [id number] GLUCOMETER to check sugar once daily and as needed for DM2   250.0  #1 x 0    Entered and Authorized by:   Allena Earing MD   Signed by:   Allena Earing MD on 04/23/2009   Method used:   Print then Give to Patient   RxID:   [id number]   Current Allergies (reviewed today): No known allergies

## 2010-02-22 NOTE — Progress Notes (Signed)
Summary: Flovent inhaler  Phone Note Refill Request Call back at [phone] Message from:  Robinson on September 23, 2009 12:56 PM  Refills Requested: Medication #1:  FLOVENT HFA 220 MCG/ACT AERO as directed   Last Refilled: 08/24/2009 [facility] [address] [phone] electronically request refill for flovent. Med list said last refill was 2008 by Billie bean? Please advise.    Method Requested: Telephone to Pharmacy Initial call taken by: Ozzie Hoyle LPN,  September 23, 8099 12:57 PM  Follow-up for Phone Call        px written on EMR for call in instructions were vague -- so I changed to 1 inhal two times a day  let me know if that is not correct  Follow-up by: Allena Earing MD,  September 23, 2009 1:04 PM  Additional Follow-up for Phone Call Additional follow up Details #1::        Spoke with pt about insturctions of use and pt said the instructions are OK to be called in but [facility] had suggested pt use flovent with when itching at colostomy site. Pt does 2 sprays on skin when changes colostomy bag. Pt said she had discussed with you before. Medication phoned [place] [phone] pharmacy as instructed. Ozzie Hoyle LPN  September 24, 5850 2:56 PM     Additional Follow-up for Phone Call Additional follow up Details #2::    thank you!- I will change instructions please disregard previous px  Follow-up by: Allena Earing MD,  September 24, 2009 8:01 AM  Additional Follow-up for Phone Call Additional follow up Details #3:: Details for Additional Follow-up Action Taken: Corrected script sent to  pharmacy.           Marty Heck CMA  September 24, 2009 8:58 AM   New/Updated Medications: FLOVENT HFA 220 MCG/ACT AERO (FLUTICASONE PROPIONATE  HFA) 1 inhalation two times a day FLOVENT HFA 220 MCG/ACT AERO (FLUTICASONE PROPIONATE  HFA) 2 sprays to colostomy site once daily as directed Prescriptions: FLOVENT HFA 220 MCG/ACT AERO (FLUTICASONE PROPIONATE  HFA) 2 sprays to  colostomy site once daily as directed  #1 mdi x 11   Entered by:   Marty Heck CMA   Authorized by:   Allena Earing MD   Signed by:   Marty Heck CMA on 09/24/2009   Method used:   Electronically to        [facility]. [address] [phone]* (retail)       [place], [place]  [id number]       Ph: [phone]       Fax: [phone]   RxID:   [id number] FLOVENT HFA 220 MCG/ACT AERO (FLUTICASONE PROPIONATE  HFA) 1 inhalation two times a day  #1 mdi x 5   Entered and Authorized by:   Allena Earing MD   Signed by:   Ozzie Hoyle LPN on 76/73/4193   Method used:   Telephoned to ...       [facility] [address] [phone]* (retail)       [address] [place], [place]  [id number]       Ph: [phone]       Fax: [phone]   [place]:   [phone]

## 2010-02-22 NOTE — Progress Notes (Signed)
Summary: form for gauze  Phone Note From Pharmacy   Caller: [facility] Summary of Call: Form for gauze, for ostomy care, is on your shelf.  Please sign. Initial call taken by: Marty Heck CMA,  September 02, 2009 10:49 AM  Follow-up for Phone Call        done in IN box Follow-up by: Allena Earing MD,  September 02, 2009 12:56 PM  Additional Follow-up for Phone Call Additional follow up Details #1::        completed form faxed to [phone]. Form to be scanned.Ozzie Hoyle LPN  September 02, 9100 3:43 PM

## 2010-02-22 NOTE — Letter (Signed)
Summary: [facility]   Imported By: Edmonia James 07/16/2009 10:35:37  _____________________________________________________________________  External Attachment:    Type:   Image     Comment:   External Document

## 2010-02-22 NOTE — Letter (Signed)
Summary: [facility] for [facility] Supplies/[facility]  CMN for Ostomy Supplies/[facility]   Imported By: Edmonia James 09/09/2009 08:27:27  _____________________________________________________________________  External Attachment:    Type:   Image     Comment:   External Document

## 2010-02-22 NOTE — Letter (Signed)
Summary: [facility]  [facility]   Imported By: Edmonia James 06/11/2009 13:16:55  _____________________________________________________________________  External Attachment:    Type:   Image     Comment:   External Document

## 2010-02-24 NOTE — Progress Notes (Signed)
Summary: sinus infection   Phone Note Call from Patient Call back at Home Phone [phone] Call back at [phone]   Caller: Patient Call For: Allena Earing MD Summary of Call: Patient went to an Urgent Care a couple of weeks ago with a sinus infection. She has been taking augmentin for this and is on her 7th day now. She says that she now has a yeast infection, has alot of burning and itching. She is asking if you could call something in for this. Uses [facility] in [place].  Initial call taken by: Lacretia Nicks,  January 07, 2010 11:08 AM  Follow-up for Phone Call        px written on EMR for call in diflucan f/u if not imp Follow-up by: Allena Earing MD,  January 07, 2010 12:12 PM  Additional Follow-up for Phone Call Additional follow up Details #1::        Med sent electronically to [facility] in [place] .Patient notified as instructed by telephone. Ozzie Hoyle LPN  January 08, 7543 12:54 PM     New/Updated Medications: FLUCONAZOLE 150 MG TABS (FLUCONAZOLE) 1 by mouth once for yeast infection Prescriptions: FLUCONAZOLE 150 MG TABS (FLUCONAZOLE) 1 by mouth once for yeast infection  #1 x 0   Entered by:   Ozzie Hoyle LPN   Authorized by:   Allena Earing MD   Signed by:   Ozzie Hoyle LPN on 92/01/69   Method used:   Electronically to        [facility]. # [phone]* (retail)       [name]       [place], [place]  [address]       Ph: [phone]       Fax: [phone]   RxID:   [id number]

## 2010-03-02 ENCOUNTER — Encounter: Payer: Self-pay | Admitting: Family Medicine

## 2010-03-02 DIAGNOSIS — K8020 Calculus of gallbladder without cholecystitis without obstruction: Secondary | ICD-10-CM

## 2010-03-02 DIAGNOSIS — I10 Essential (primary) hypertension: Secondary | ICD-10-CM

## 2010-03-02 DIAGNOSIS — E669 Obesity, unspecified: Secondary | ICD-10-CM

## 2010-03-02 DIAGNOSIS — Z8719 Personal history of other diseases of the digestive system: Secondary | ICD-10-CM | POA: Insufficient documentation

## 2010-03-02 DIAGNOSIS — R739 Hyperglycemia, unspecified: Secondary | ICD-10-CM

## 2010-03-02 DIAGNOSIS — M1990 Unspecified osteoarthritis, unspecified site: Secondary | ICD-10-CM | POA: Insufficient documentation

## 2010-03-02 DIAGNOSIS — K529 Noninfective gastroenteritis and colitis, unspecified: Secondary | ICD-10-CM

## 2010-03-02 DIAGNOSIS — K56609 Unspecified intestinal obstruction, unspecified as to partial versus complete obstruction: Secondary | ICD-10-CM

## 2010-04-14 ENCOUNTER — Other Ambulatory Visit: Payer: Self-pay

## 2010-04-21 ENCOUNTER — Ambulatory Visit: Payer: Self-pay | Admitting: Family Medicine

## 2010-04-22 ENCOUNTER — Other Ambulatory Visit: Payer: Self-pay | Admitting: Family Medicine

## 2010-04-22 DIAGNOSIS — E7800 Pure hypercholesterolemia, unspecified: Secondary | ICD-10-CM

## 2010-04-22 DIAGNOSIS — I10 Essential (primary) hypertension: Secondary | ICD-10-CM

## 2010-04-25 ENCOUNTER — Other Ambulatory Visit (INDEPENDENT_AMBULATORY_CARE_PROVIDER_SITE_OTHER): Payer: BC Managed Care – PPO | Admitting: Family Medicine

## 2010-04-25 DIAGNOSIS — E785 Hyperlipidemia, unspecified: Secondary | ICD-10-CM

## 2010-04-25 DIAGNOSIS — E7800 Pure hypercholesterolemia, unspecified: Secondary | ICD-10-CM

## 2010-04-25 DIAGNOSIS — I10 Essential (primary) hypertension: Secondary | ICD-10-CM

## 2010-04-25 LAB — RENAL FUNCTION PANEL
CO2: 27 mEq/L (ref 19–32)
Calcium: 8.9 mg/dL (ref 8.4–10.5)
Chloride: 99 mEq/L (ref 96–112)
Creatinine, Ser: 0.7 mg/dL (ref 0.4–1.2)
Sodium: 138 mEq/L (ref 135–145)

## 2010-04-25 LAB — LIPID PANEL
Cholesterol: 185 mg/dL (ref 0–200)
Total CHOL/HDL Ratio: 4
VLDL: 42.2 mg/dL — ABNORMAL HIGH (ref 0.0–40.0)

## 2010-04-25 LAB — ALT: ALT: 33 U/L (ref 0–35)

## 2010-04-25 LAB — LDL CHOLESTEROL, DIRECT: Direct LDL: 110.3 mg/dL

## 2010-04-26 ENCOUNTER — Ambulatory Visit: Payer: Self-pay | Admitting: Family Medicine

## 2010-05-02 ENCOUNTER — Ambulatory Visit (INDEPENDENT_AMBULATORY_CARE_PROVIDER_SITE_OTHER): Payer: BC Managed Care – PPO | Admitting: Family Medicine

## 2010-05-02 ENCOUNTER — Encounter: Payer: Self-pay | Admitting: Family Medicine

## 2010-05-02 DIAGNOSIS — F502 Bulimia nervosa: Secondary | ICD-10-CM

## 2010-05-02 DIAGNOSIS — R7309 Other abnormal glucose: Secondary | ICD-10-CM

## 2010-05-02 DIAGNOSIS — I10 Essential (primary) hypertension: Secondary | ICD-10-CM

## 2010-05-02 DIAGNOSIS — L708 Other acne: Secondary | ICD-10-CM

## 2010-05-02 DIAGNOSIS — R4589 Other symptoms and signs involving emotional state: Secondary | ICD-10-CM

## 2010-05-02 DIAGNOSIS — F5089 Other specified eating disorder: Secondary | ICD-10-CM | POA: Insufficient documentation

## 2010-05-02 DIAGNOSIS — L719 Rosacea, unspecified: Secondary | ICD-10-CM

## 2010-05-02 DIAGNOSIS — E669 Obesity, unspecified: Secondary | ICD-10-CM

## 2010-05-02 DIAGNOSIS — L700 Acne vulgaris: Secondary | ICD-10-CM

## 2010-05-02 DIAGNOSIS — F439 Reaction to severe stress, unspecified: Secondary | ICD-10-CM | POA: Insufficient documentation

## 2010-05-02 MED ORDER — METRONIDAZOLE 1 % EX GEL
Freq: Every day | CUTANEOUS | Status: DC
Start: 2010-05-02 — End: 2011-01-02

## 2010-05-02 MED ORDER — ZOLPIDEM TARTRATE 10 MG PO TABS: 10.0000 mg | ORAL_TABLET | Freq: Every evening | ORAL | Status: DC | PRN

## 2010-05-02 NOTE — Assessment & Plan Note (Signed)
Good control- no changes Lab rev Made plan for exercise

## 2010-05-02 NOTE — Progress Notes (Signed)
Subjective:    Patient ID: Jamie [name], female    DOB: Sep 09, 1955, 55 y.o.   MRN: [id number]  HPI Here for f/u of HTN and lipids and hyperglycemia and rosacea and to check spot on her back   Has been feeling ok overall  Has had a difficult 6 months --her father died - was in hosp -- ulcerative colitis , with 2 bowel resections  That has caused emotional eating and quit caring about her diet  Is interested in counseling   HTN in fair control with 138/80 today On beta blocker  No headaches or sid eff  Hyperglycemia is a bit worse A1c is 6.4 up from 6.2 Diet  -- bad with stress  Exercise -- no exercise   Lipids are fair  HD 47 and LDL 110 Lab Results  Component Value Date   CHOL 185 04/25/2010   CHOL 168 10/12/2008   Lab Results  Component Value Date   HDL 47.10 04/25/2010   HDL 35.70* 10/12/2008   Lab Results  Component Value Date   LDLCALC 101* 10/12/2008   Lab Results  Component Value Date   TRIG 211.0* 04/25/2010   TRIG 159.0* 10/12/2008   Lab Results  Component Value Date   CHOLHDL 4 04/25/2010   CHOLHDL 5 10/12/2008    Wt is stable with bmi of 70   Mole under bra on back- ? Clogged pore   Rosacea - wants to try something for that ? Metro lotion in the past  Gets worse with ? Stress  Almost like acne Redness also   Past Medical History  Diagnosis Date  . Arthritis   . Gall stones   . Colitis     with colectomy  . HTN (hypertension)   . Obesity   . Bowel obstruction     repeated (? possible adhesions) neg EGD  . Rosacea   . Hyperglycemia     mild    Past Surgical History  Procedure Date  . Total abdominal hysterectomy 05/2006    for abscessed ovaries  . Colectomy 1993    History   Social History  . Marital Status: Married    Spouse Name: N/A    Number of Children: 3  . Years of Education: N/A   Occupational History  . Guardian Ad Litem    Social History Main Topics  . Smoking status: Never Smoker   . Smokeless tobacco: Not on file  .  Alcohol Use: No  . Drug Use: No  . Sexually Active: Not on file   Other Topics Concern  . Not on file   Social History Narrative  . No narrative on file    Family History  Problem Relation Age of Onset  . Cancer Father     colon  . Liver cancer Father     resection secondary to mets  . Hyperlipidemia Father   . Hypertension Father   . Allergies Father   . Ulcerative colitis Father   . Hypertension Mother   . Hyperlipidemia Mother   . Cirrhosis Mother     NASH  . Diabetes Mother   . Allergies Brother        Review of Systems  Constitutional: Positive for fatigue. Negative for fever and unexpected weight change.  Eyes: Negative for pain and visual disturbance.  Respiratory: Negative for cough and shortness of breath.   Cardiovascular: Negative for chest pain, palpitations and leg swelling.  Gastrointestinal: Negative for nausea, vomiting and abdominal pain.  Genitourinary:  Negative for frequency.  Skin: Negative for color change, pallor and rash.  Neurological: Negative for light-headedness, numbness and headaches.  Hematological: Negative for adenopathy. Does not bruise/bleed easily.  Psychiatric/Behavioral: Positive for dysphoric mood. The patient is nervous/anxious.        Objective:   Physical Exam  Constitutional: She appears well-developed and well-nourished.       overwt and well appearing   HENT:  Head: Normocephalic and atraumatic.  Eyes: Conjunctivae and EOM are normal. Pupils are equal, round, and reactive to light.  Neck: Normal range of motion. Neck supple. No JVD present. No thyromegaly present.  Cardiovascular: Normal rate, regular rhythm and normal heart sounds.   Pulmonary/Chest: Effort normal and breath sounds normal.  Abdominal: Soft. Bowel sounds are normal. There is no tenderness.       Colostomy intact   Musculoskeletal: She exhibits no edema and no tenderness.  Lymphadenopathy:    She has no cervical adenopathy.  Neurological: She is  alert. She has normal reflexes. Coordination normal.  Skin: Skin is warm and dry. No pallor.       2-3 mm lesion on mid back- comedone with waxy material easily expressed  Cleaned with alcohol  No signs of infection  Rosacea on face with some flushing  No acne   Psychiatric: She has a normal mood and affect.          Assessment & Plan:

## 2010-05-02 NOTE — Assessment & Plan Note (Signed)
Ref made for compulsive/emotional overeating with psychologist

## 2010-05-02 NOTE — Assessment & Plan Note (Signed)
Ref to counseling for this  Wt loss impt to health

## 2010-05-02 NOTE — Patient Instructions (Signed)
We will do counseling referral at check out  Try the metrogel on face daily for rosacea and use good sunscreen too  Work on exercise Work on low fat low sugar diet  Schedule fasting lab and follow up in 6 months

## 2010-05-02 NOTE — Assessment & Plan Note (Signed)
Worse with compulsive overeating and lack of exercise Disc this in detail  Counseling ref made

## 2010-05-02 NOTE — Assessment & Plan Note (Signed)
Grief with loss of father Overall fairly good coping skills except for eating  Ref to counselor

## 2010-05-03 DIAGNOSIS — L700 Acne vulgaris: Secondary | ICD-10-CM | POA: Insufficient documentation

## 2010-05-03 NOTE — Assessment & Plan Note (Signed)
On back - contents easily expressed and no signs of infection inst to keep clean - abx oint if necessary Alert me if problems

## 2010-05-03 NOTE — Assessment & Plan Note (Signed)
Trial of metrogel for rosacea on face Disc imp of sun protection and avoidance of hot drinks and caffiene/ spicy foods Pt will keep me updated

## 2010-05-06 ENCOUNTER — Other Ambulatory Visit: Payer: Self-pay | Admitting: Family Medicine

## 2010-05-10 ENCOUNTER — Ambulatory Visit (INDEPENDENT_AMBULATORY_CARE_PROVIDER_SITE_OTHER): Payer: BC Managed Care – PPO | Admitting: Psychology

## 2010-05-10 DIAGNOSIS — F4323 Adjustment disorder with mixed anxiety and depressed mood: Secondary | ICD-10-CM

## 2010-05-31 ENCOUNTER — Ambulatory Visit (INDEPENDENT_AMBULATORY_CARE_PROVIDER_SITE_OTHER): Payer: BC Managed Care – PPO | Admitting: Psychology

## 2010-05-31 DIAGNOSIS — F4323 Adjustment disorder with mixed anxiety and depressed mood: Secondary | ICD-10-CM

## 2010-06-07 NOTE — Assessment & Plan Note (Signed)
[place] OFFICE NOTE   NAME:Petraglia, HIRA TRENT                      MRN:          [id number]  DATE:08/06/2006                            DOB:          09/27/1955    REASON FOR CONSULTATION:  Abdominal pain.   [name] is a pleasant 55 year old white female referred through the  courtesy of Dr. Glori Bickers for evaluation.  Over the last 6 to 8 years, she  has been suffering from intermittent abdominal pain.  This consists of  moderately severe, sharp left upper quadrant pain.  With pain, she will  have nausea and occasional vomiting.  The pain may last up to 8 hours.  During these episodes, her stools from her ileostomy become watery.  She  has some abdominal distension.  In every case, symptoms have  spontaneously resolved.  She is status post total colonic resection with  an ileoanal pull-through and now has a permanent ileal pouch.  She  underwent a CT scan of the abdomen and pelvis at [facility] that demonstrated  a single enlarged portacaval lymph node and gallbladder stones.   PAST MEDICAL HISTORY:  1. Hypertension.  2. Status post hysterectomy in May of 2008.   FAMILY HISTORY:  Noncontributory.   MEDICATIONS:  Toprol-XL and a multivitamin.   ALLERGIES:  NONE.   SOCIAL HISTORY:  She neither smokes nor drinks.  She is married and  works as a Technical brewer at [employer].   REVIEW OF SYSTEMS:  Unremarkable.   PHYSICAL EXAMINATION:  VITAL SIGNS:  Pulse 80, blood pressure 142/90.  Weight 232.  HEENT:  EOMI. PERRLA. Sclerae are anicteric.  Conjunctivae are pink.  NECK:  Supple without thyromegaly, adenopathy or carotid bruits.  CHEST:  Clear to auscultation and percussion without adventitious  sounds.  CARDIAC:  Regular rhythm; normal S1 S2.  There are no murmurs, gallops  or rubs.  ABDOMEN:  An ileostomy is in place.  Bowel sounds are normoactive.  Abdomen is soft, non-tender and  non-distended.  There are no abdominal  masses, tenderness, splenic enlargement or hepatomegaly.  EXTREMITIES:  Full range of motion.  No cyanosis, clubbing or edema.  RECTAL:  Deferred.   IMPRESSION:  Periodic abdominal pain suggestive of transient small-bowel  obstruction.  I suspect she could have adhesions.  A permanent area of  stenosis is less likely.   RECOMMENDATION:  Small bowel enteroscopy.     Sandy Salaam. Deatra Ina, MD,FACG  Electronically Signed    RDK/MedQ  DD: 08/06/2006  DT: 08/07/2006  Job #: [id number]   cc:   Marne A. Glori Bickers, MD  Lucien Mons, M.D.

## 2010-06-07 NOTE — Assessment & Plan Note (Signed)
[place] OFFICE NOTE   NAME:[name], Jamie WANN                      MRN:          [id number]  DATE:02/18/2007                            DOB:          December 16, 1955    PROBLEM:  Abdominal pain.   [name] has returned for reevaluation.  She continues to complain  of intermittent sharp upper abdominal pain that happens within 20  minutes postprandially.  It tends to occur with raw vegetables.  A small  bowel enterography was entirely normal.  Symptoms occur about once a  month.  She is status post total colectomy for ulcerative colitis.   EXAM:  Pulse 88, blood pressure 120/64, weight 236 pounds.   IMPRESSION:  On the basis of her symptoms, I suspect that she developed  a partial small bowel obstruction with certain foods, secondary to  adhesions.   RECOMMENDATIONS:  NuLev 0.25 mg sublingual after the onset of any pain.     Sandy Salaam. Deatra Ina, MD,FACG  Electronically Signed    RDK/MedQ  DD: 02/18/2007  DT: 02/18/2007  Job #: [id number]   cc:   Marne A. Glori Bickers, MD

## 2010-06-07 NOTE — Letter (Signed)
August 06, 2006    [name]   RE:  Jamie [name], Jamie [name]  MRN:  [id number]  /  DOB:  [birth year]   Dear Santiago Glad:   It is my pleasure to have treated you recently as a new patient in my  office.  I appreciate your confidence and the opportunity to participate  in your care.   Since I do have a busy inpatient endoscopy schedule and office schedule,  my office hours vary weekly.  I am, however, available for emergency  calls every day through my office.  If I cannot promptly meet an urgent  office appointment, another one of our gastroenterologists will be able  to assist you.   My well-trained staff are prepared to help you at all times.  For  emergencies after office hours, a physician from our gastroenterology  section is always available through my 24-hour answering service.   While you are under my care, I encourage discussion of your questions  and concerns, and I will be happy to return your calls as soon as I am  available.   Once again, I welcome you as a new patient and I look forward to a happy  and healthy relationship.    Sincerely,      Sandy Salaam. Deatra Ina, MD,FACG  Electronically Signed   RDK/MedQ  DD: 08/06/2006  DT: 08/07/2006  Job #: [id number]

## 2010-06-07 NOTE — Letter (Signed)
August 06, 2006    McKinney A. Tower, MD  [address] [place], [place] [address]   RE:  [name], Jamie  MRN:  [id number]  /  DOB:  1955-09-27   Dear Dr. Glori Bickers:   Upon your kind referral, I had the pleasure of evaluating your patient  and I am pleased to offer my findings.  I saw [name] in the  office today.  Enclosed is a copy of my progress note that details my  findings and recommendations.   Thank you for the opportunity to participate in your patient's care.    Sincerely,      Sandy Salaam. Deatra Ina, MD,FACG  Electronically Signed    RDK/MedQ  DD: 08/06/2006  DT: 08/07/2006  Job #: [id number]

## 2010-06-21 ENCOUNTER — Ambulatory Visit (INDEPENDENT_AMBULATORY_CARE_PROVIDER_SITE_OTHER): Payer: BC Managed Care – PPO | Admitting: Psychology

## 2010-06-21 DIAGNOSIS — F4323 Adjustment disorder with mixed anxiety and depressed mood: Secondary | ICD-10-CM

## 2010-07-12 ENCOUNTER — Ambulatory Visit (INDEPENDENT_AMBULATORY_CARE_PROVIDER_SITE_OTHER): Payer: BC Managed Care – PPO | Admitting: Psychology

## 2010-07-12 DIAGNOSIS — F4323 Adjustment disorder with mixed anxiety and depressed mood: Secondary | ICD-10-CM

## 2010-09-01 ENCOUNTER — Other Ambulatory Visit: Payer: Self-pay | Admitting: Family Medicine

## 2010-09-01 MED ORDER — FLUTICASONE PROPIONATE HFA 220 MCG/ACT IN AERO: INHALATION_SPRAY | RESPIRATORY_TRACT | Status: DC

## 2010-09-01 NOTE — Telephone Encounter (Signed)
It is to colostomy site  Will send electronically

## 2010-09-01 NOTE — Telephone Encounter (Signed)
[facility] in [place] sent refill request electronically for Flovent with instructions inhale one puff twice daily as directed. The med list instructions on pt's 05/02/10 visit were 2 sprays to colostomy site. Unable to reach pt by phone.Please advise.

## 2010-09-08 ENCOUNTER — Ambulatory Visit (INDEPENDENT_AMBULATORY_CARE_PROVIDER_SITE_OTHER): Payer: BC Managed Care – PPO | Admitting: Family Medicine

## 2010-09-08 ENCOUNTER — Encounter: Payer: Self-pay | Admitting: Family Medicine

## 2010-09-08 VITALS — BP 138/80 | HR 68 | Temp 98.3°F | Wt 239.8 lb

## 2010-09-08 DIAGNOSIS — R109 Unspecified abdominal pain: Secondary | ICD-10-CM | POA: Insufficient documentation

## 2010-09-08 DIAGNOSIS — R1012 Left upper quadrant pain: Secondary | ICD-10-CM

## 2010-09-08 LAB — CBC WITH DIFFERENTIAL/PLATELET
Basophils Absolute: 0 10*3/uL (ref 0.0–0.1)
Basophils Relative: 0.4 % (ref 0.0–3.0)
Eosinophils Absolute: 0.5 10*3/uL (ref 0.0–0.7)
Lymphocytes Relative: 21.1 % (ref 12.0–46.0)
MCHC: 34.9 g/dL (ref 30.0–36.0)
Neutrophils Relative %: 65.4 % (ref 43.0–77.0)
RBC: 4.55 Mil/uL (ref 3.87–5.11)

## 2010-09-08 LAB — COMPREHENSIVE METABOLIC PANEL
AST: 41 U/L — ABNORMAL HIGH (ref 0–37)
Albumin: 3.8 g/dL (ref 3.5–5.2)
BUN: 11 mg/dL (ref 6–23)
Calcium: 8.8 mg/dL (ref 8.4–10.5)
Chloride: 103 mEq/L (ref 96–112)
Glucose, Bld: 97 mg/dL (ref 70–99)
Potassium: 4.1 mEq/L (ref 3.5–5.1)

## 2010-09-08 MED ORDER — HYOSCYAMINE SULFATE 0.125 MG SL SUBL: 0.1250 mg | SUBLINGUAL_TABLET | SUBLINGUAL | Status: DC | PRN

## 2010-09-08 NOTE — Assessment & Plan Note (Addendum)
Unclear etiology. Pt endorses LUQ pain however on exam more LLQ pain.  Not like obstruction pain she has had in past. Not consistent with MSK etiology.  Colon absent except sigmoid. Nontoxic on exam today, no fevers/chills, BM changes. Check lipase, CMP and CBC today. Trial of hyoscyamine as well as ensuring staying hydrated.  Will update her with results of blood work. If continued pain, consider referral to GI vs abd [place].

## 2010-09-08 NOTE — Patient Instructions (Addendum)
I'm not quite sure where this pain is coming from. Ensure staying well hydrated. Try hyomax for discomfort to see if will help. Blood work today. Watch for fever >101, change at ileostomy, or bowel movement change.  If that happens, let us know. If not getting better with time, let us know as well and we may send you to GI doctor for further evaluation.

## 2010-09-08 NOTE — Progress Notes (Signed)
  Subjective:    Patient ID: Jamie [name], female    DOB: November 06, 1955, 55 y.o.   MRN: [id number]  HPI CC: abd pain  2d h/o sharp pain under left ribcage, lasted a minute.  Going on intermittently throughout day.  Then yesterday started becoming dull discomfort, still left side pain under ribcage.  No association with food/drink or position changes.  Hasn't tried anything for this pain yet.  Able to sleep fine.  No fevers/chills, nausea/vomiting.  No cough.  No BM changes.  No blood in stool.  No urinary change, dysuria, urgency.  No reflux sxs.  Takes prilosec daily for reflux.  H/o ulcerative colitis with ileostomy since 1993 as well as ? bowel obstruction in past.  No colon.  Has seen GI in [place] in past Deatra Ina).  H/o gallstones, gallbladder still present.    Past Medical History  Diagnosis Date  . Arthritis   . Gall stones   . Colitis     with colectomy  . HTN (hypertension)   . Obesity   . Bowel obstruction     repeated (? possible adhesions) neg EGD  . Rosacea   . Hyperglycemia     mild   Medications and allergies reviewed and updated in chart. Review of Systems Per HPI    Objective:   Physical Exam  Nursing note and vitals reviewed. Constitutional: She appears well-developed and well-nourished. No distress.  HENT:  Head: Normocephalic and atraumatic.  Mouth/Throat: Oropharynx is clear and moist. No oropharyngeal exudate.  Eyes: Conjunctivae and EOM are normal. Pupils are equal, round, and reactive to light.  Neck: Normal range of motion. Neck supple.  Cardiovascular: Normal rate, regular rhythm, normal heart sounds and intact distal pulses.   No murmur heard. Pulmonary/Chest: Effort normal and breath sounds normal. No respiratory distress. She has no wheezes. She has no rales.  Abdominal: Soft. Bowel sounds are normal. She exhibits no distension and no mass. There is no hepatosplenomegaly. There is tenderness (mild) in the left upper quadrant and left lower quadrant.  There is no rebound, no guarding and no CVA tenderness.       Ileostomy on R side of abdomen c/d/i, no surrounding erythema or induration, no drainage.  Musculoskeletal: She exhibits no edema.  Skin: Skin is warm and dry. No rash noted.  Psychiatric: She has a normal mood and affect.          Assessment & Plan:

## 2010-10-31 ENCOUNTER — Telehealth: Payer: Self-pay | Admitting: *Deleted

## 2010-10-31 ENCOUNTER — Other Ambulatory Visit (INDEPENDENT_AMBULATORY_CARE_PROVIDER_SITE_OTHER): Payer: BC Managed Care – PPO

## 2010-10-31 DIAGNOSIS — R7309 Other abnormal glucose: Secondary | ICD-10-CM

## 2010-10-31 DIAGNOSIS — I10 Essential (primary) hypertension: Secondary | ICD-10-CM

## 2010-10-31 DIAGNOSIS — Z1231 Encounter for screening mammogram for malignant neoplasm of breast: Secondary | ICD-10-CM | POA: Insufficient documentation

## 2010-10-31 LAB — HEMOGLOBIN A1C: Hgb A1c MFr Bld: 6.1 % (ref 4.6–6.5)

## 2010-10-31 LAB — RENAL FUNCTION PANEL
Calcium: 8.4 mg/dL (ref 8.4–10.5)
GFR: 56.91 mL/min — ABNORMAL LOW (ref >60.00)
Glucose, Bld: 123 mg/dL — ABNORMAL HIGH (ref 70–99)
Phosphorus: 2.8 mg/dL (ref 2.3–4.6)
Potassium: 4.2 mEq/L (ref 3.5–5.1)
Sodium: 142 mEq/L (ref 135–145)

## 2010-10-31 NOTE — Telephone Encounter (Signed)
Pt is asking for referral for mammogram.  She normally goes to [facility] but is ok with switching to [facility].

## 2010-10-31 NOTE — Telephone Encounter (Signed)
Will order mam

## 2010-11-02 ENCOUNTER — Ambulatory Visit: Payer: BC Managed Care – PPO | Admitting: Family Medicine

## 2010-11-04 ENCOUNTER — Ambulatory Visit: Payer: BC Managed Care – PPO | Admitting: Family Medicine

## 2010-11-05 ENCOUNTER — Other Ambulatory Visit: Payer: Self-pay | Admitting: Family Medicine

## 2010-11-07 ENCOUNTER — Ambulatory Visit: Payer: BC Managed Care – PPO | Admitting: Family Medicine

## 2010-11-14 ENCOUNTER — Encounter: Payer: Self-pay | Admitting: Family Medicine

## 2010-11-14 ENCOUNTER — Ambulatory Visit (INDEPENDENT_AMBULATORY_CARE_PROVIDER_SITE_OTHER): Payer: BC Managed Care – PPO | Admitting: Family Medicine

## 2010-11-14 VITALS — BP 124/78 | HR 80 | Temp 97.7°F | Ht 66.0 in | Wt 242.0 lb

## 2010-11-14 DIAGNOSIS — R0681 Apnea, not elsewhere classified: Secondary | ICD-10-CM

## 2010-11-14 DIAGNOSIS — D2270 Melanocytic nevi of unspecified lower limb, including hip: Secondary | ICD-10-CM | POA: Insufficient documentation

## 2010-11-14 DIAGNOSIS — I10 Essential (primary) hypertension: Secondary | ICD-10-CM

## 2010-11-14 DIAGNOSIS — M542 Cervicalgia: Secondary | ICD-10-CM

## 2010-11-14 DIAGNOSIS — R7309 Other abnormal glucose: Secondary | ICD-10-CM

## 2010-11-14 DIAGNOSIS — D2370 Other benign neoplasm of skin of unspecified lower limb, including hip: Secondary | ICD-10-CM

## 2010-11-14 NOTE — Patient Instructions (Addendum)
We will do referral for neck ultrasound and also sleep disorder clinic at check out  Please schedule follow up for mole removal on your leg  Keep working on weight watchers -- and get exercise if you can  Sugar is better controlled

## 2010-11-14 NOTE — Progress Notes (Signed)
Subjective:    Patient ID: Jamie [name], female    DOB: 1955-01-26, 55 y.o.   MRN: [id number]  HPI Here for f/u of HTN and hyperglycemia and stress reaction/ overeating and a new sore spot on her ant neck Also sleep problem Also mole on her R lower medial leg  Is red around it - been there for years but more irritated   Neck area - there for months - feels it from time to time  No trouble swallowing  occ wakes up in middle of a dream - heart is racing and hard to catch her breath  Wondered if she could be having apnea  She does snore  She does not really wake up tired , however  She also had apnea event in recovery after her endoscopy   Wt is up 2 lb with bmi of 39 Did go back to weight watchers -- had lost 10 lbs (gained some back)  Does like weight watchers and meeting  Is in a better mind frame with that   HTN is in good control 124/78 No cp or sob or palpitations or ha  Hyperglycemia is imp with a1c of 6.1 Diet-- is better overall - avoiding the sweets   Stress issues - came to see Dr Rexene Edison a few times - she did not find it very helpful  In the meantime she has decided to retire- a good decision  Something to look forward to  She thinks her own attitude was a problem  She did some reflection on work and grief   Patient Active Problem List  Diagnoses  . OBESITY  . HYPERTENSION  . COLITIS, ULCERATIVE NOS  . OVARIAN MASS  . ROSACEA  . KNEE PAIN  . SLEEP DISORDER  . HOARSENESS  . HYPERGLYCEMIA  . ABDOMINAL PAIN, LEFT LOWER QUADRANT, HX OF  . Arthritis  . Gall stones  . Bowel obstruction  . Hyperglycemia  . Stress reaction, emotional  . Compulsive overeater  . [place]  . Left sided abdominal pain  . Other screening mammogram  . Apnea  . Neck pain  . Nevus of lower leg   Past Medical History  Diagnosis Date  . Arthritis   . Gall stones   . Colitis     with colectomy  . HTN (hypertension)   . Obesity   . Bowel obstruction     repeated (?  possible adhesions) neg EGD  . Rosacea   . Hyperglycemia     mild   Past Surgical History  Procedure Date  . Total abdominal hysterectomy 05/2006    for abscessed ovaries  . Colectomy 1993   History  Substance Use Topics  . Smoking status: Never Smoker   . Smokeless tobacco: Not on file  . Alcohol Use: No   Family History  Problem Relation Age of Onset  . Cancer Father     colon  . Liver cancer Father     resection secondary to mets  . Hyperlipidemia Father   . Hypertension Father   . Allergies Father   . Ulcerative colitis Father   . Hypertension Mother   . Hyperlipidemia Mother   . Cirrhosis Mother     NASH  . Diabetes Mother   . Allergies Brother    Allergies  Allergen Reactions  . Lisinopril     REACTION: felt bad/ stomach hurt/ weak and tired   Current Outpatient Prescriptions on File Prior to Visit  Medication Sig Dispense Refill  . Blood  Glucose Monitoring Suppl (GLUCOMETER ELITE CLASSIC) KIT by Does not apply route daily. Check sugar once daily and as needed for DM2 250.0       . Cinnamon ([facility] CINNAMON) 500 MG capsule Take 1,000 mg by mouth daily.        . fluticasone (FLOVENT HFA) 220 MCG/ACT inhaler 2 puffs topically to colostomy site once daily  1 Inhaler  11  . glucose blood test strip 1 each by Other route daily. Use daily and as needed for DM@@ 250.0       . meloxicam (MOBIC) 7.5 MG tablet Take 7.5 mg by mouth daily as needed. With meal for pain       . Multiple Vitamin (MULTIVITAMIN) tablet Take 1 tablet by mouth daily.        Marland Kitchen omeprazole (PRILOSEC) 20 MG capsule Take 20 mg by mouth daily.        Marland Kitchen zolpidem (AMBIEN) 10 MG tablet Take 1 tablet (10 mg total) by mouth at bedtime as needed.  30 tablet  0  . hyoscyamine (LEVSIN SL) 0.125 MG SL tablet Take 1 tablet (0.125 mg total) by mouth every 4 (four) hours as needed. For abdominal cramps  30 tablet  1  . metroNIDAZOLE (METROGEL) 1 % gel Apply topically daily.  45 g  11      Review of Systems Review  of Systems  Constitutional: Negative for fever, appetite change, fatigue and unexpected weight change.  Eyes: Negative for pain and visual disturbance.  ENT no problems swallowing / pos for discomfort in neck  Respiratory: Negative for cough and shortness of breath.  pos for gasping at night  Cardiovascular: Negative for cp or palpitations    Gastrointestinal: Negative for nausea, diarrhea and constipation.  Genitourinary: Negative for urgency and frequency.  Skin: Negative for pallor or rash  pos for lesion on leg  Neurological: Negative for weakness, light-headedness, numbness and headaches.  Hematological: Negative for adenopathy. Does not bruise/bleed easily.  Psychiatric/Behavioral: Negative for dysphoric mood. The patient is not nervous/anxious.          Objective:   Physical Exam  Constitutional: She appears well-developed and well-nourished. No distress.       Obese and well appearing   HENT:  Head: Normocephalic and atraumatic.  Right Ear: External ear normal.  Left Ear: External ear normal.  Nose: Nose normal.  Mouth/Throat: Oropharynx is clear and moist. No oropharyngeal exudate.  Eyes: Conjunctivae and EOM are normal. Pupils are equal, round, and reactive to light. Right eye exhibits no discharge. Left eye exhibits no discharge.  Neck: Normal range of motion. Neck supple. No JVD present. Carotid bruit is not present. No tracheal deviation present. Thyromegaly present.       Thyroid feels mildly prominent with some tenderness on L side  Cardiovascular: Normal rate, regular rhythm, normal heart sounds and intact distal pulses.  Exam reveals no gallop.   Pulmonary/Chest: Breath sounds normal. No respiratory distress. She has no wheezes. She has no rales.  Abdominal: Soft. Bowel sounds are normal. She exhibits no distension, no abdominal bruit and no mass. There is no tenderness.  Musculoskeletal: Normal range of motion. She exhibits no edema and no tenderness.    Lymphadenopathy:    She has no cervical adenopathy.  Neurological: She has normal reflexes. No cranial nerve deficit. She exhibits normal muscle tone. Coordination normal.       No tremor   Skin: Skin is warm and dry. No rash noted. No erythema. No pallor.  Skin lesion R medial lower leg with redness and scale  Psychiatric: She has a normal mood and affect.          Assessment & Plan:

## 2010-11-15 ENCOUNTER — Ambulatory Visit: Payer: Self-pay | Admitting: Family Medicine

## 2010-11-16 ENCOUNTER — Encounter: Payer: Self-pay | Admitting: Family Medicine

## 2010-11-17 NOTE — Assessment & Plan Note (Signed)
Discomfort in ant neck with prominent feeling thyroid  Will check thyroid ultrasound and and adv

## 2010-11-17 NOTE — Assessment & Plan Note (Signed)
Some witnessed apnea in overwt pt with snoring  Ref to sleep clinic for eval for likely sleep apnea

## 2010-11-17 NOTE — Assessment & Plan Note (Signed)
Suspicious app mole- sched f/u for removal

## 2010-11-17 NOTE — Assessment & Plan Note (Signed)
Improved Lab Results  Component Value Date   HGBA1C 6.1 10/31/2010   Disc need for low glycemic diet and exercise and to keep working on wt loss

## 2010-11-17 NOTE — Assessment & Plan Note (Signed)
Controlled fairly with metoprolol No changes  Enc to keep working on lifestyle change

## 2010-11-20 ENCOUNTER — Telehealth: Payer: Self-pay | Admitting: Family Medicine

## 2010-11-20 DIAGNOSIS — M542 Cervicalgia: Secondary | ICD-10-CM

## 2010-11-20 NOTE — Telephone Encounter (Signed)
Will ref to ENT for neck symptoms

## 2010-11-20 NOTE — Telephone Encounter (Signed)
Message copied by Abner Greenspan on Sun Nov 20, 2010 10:16 PM ------      Message from: Helene Shoe      Created: Fri Nov 18, 2010  5:20 PM      Regarding: ENT referral       Please see result note.      ----- Message -----         From: Loura Pardon, MD         Sent: 11/17/2010   3:37 PM           To: Jess Barters, LPN            Normal neck/ thyroid ultrasound- which is reassuring       If problem still bothers her I would like to refer her to ENT for further eval       Let me know please

## 2010-11-28 ENCOUNTER — Telehealth: Payer: Self-pay | Admitting: Family Medicine

## 2010-11-28 NOTE — Telephone Encounter (Signed)
Patient does not want referral to ENT at all. She will be retiring soon and said her neck isnt bothering her anymore. I will cancel the referral in Epic.

## 2010-11-28 NOTE — Telephone Encounter (Signed)
Thanks for the update

## 2010-12-13 ENCOUNTER — Institutional Professional Consult (permissible substitution): Payer: BC Managed Care – PPO | Admitting: Pulmonary Disease

## 2011-01-02 ENCOUNTER — Encounter: Payer: Self-pay | Admitting: Pulmonary Disease

## 2011-01-02 ENCOUNTER — Ambulatory Visit (INDEPENDENT_AMBULATORY_CARE_PROVIDER_SITE_OTHER): Payer: BC Managed Care – PPO | Admitting: Pulmonary Disease

## 2011-01-02 ENCOUNTER — Ambulatory Visit: Payer: Self-pay | Admitting: Family Medicine

## 2011-01-02 VITALS — BP 154/82 | HR 85 | Temp 98.6°F | Ht 66.0 in | Wt 244.0 lb

## 2011-01-02 DIAGNOSIS — G479 Sleep disorder, unspecified: Secondary | ICD-10-CM

## 2011-01-02 DIAGNOSIS — G4733 Obstructive sleep apnea (adult) (pediatric): Secondary | ICD-10-CM

## 2011-01-02 NOTE — Progress Notes (Signed)
  Subjective:    Patient ID: Jamie [name], female    DOB: April 20, 1955, 55 y.o.   MRN: [id number]  HPI The patient is a 55 year old female who been asked to see for possible obstructive sleep apnea.  She has been noted to have loud snoring, and admits to occasional choking arousals.  Her husband wears a CPAP device, and she is unsure if he is aware enough to witness an abnormal breathing pattern during sleep.  She did have a GI procedure a few years ago where she was found to have apnea after conscious sedation.  The patient feels that she is a restless sleeper, and will often move to the sofa to finish out the night if she awakens.  However, she feels that she is rested most mornings.  She denies any significant sleep pressure during the day, and is not overly sleepy in the evenings watching television.  But she admits that she stays active with knitting.  She denies any sleepiness issues with driving.  The patient states that her weight is neutral over the last 2 years, and her Epworth score is only 7 today.  Sleep Questionnaire: What time do you typically go to bed?( Between what hours) 10:30 to 11:00 pm How long does it take you to fall asleep? 10 to 15 mins How many times during the night do you wake up? 2 What time do you get out of bed to start your day? 0700 Do you drive or operate heavy machinery in your occupation? No How much has your weight changed (up or down) over the past two years? (In pounds) 10 lb (4.536 kg) Have you ever had a sleep study before? No Do you currently use CPAP? No Do you wear oxygen at any time? No     Review of Systems  Constitutional: Negative for fever and unexpected weight change.  HENT: Negative for ear pain, nosebleeds, congestion, sore throat, rhinorrhea, sneezing, trouble swallowing, dental problem, postnasal drip and sinus pressure.   Eyes: Negative for redness and itching.  Respiratory: Negative for cough, chest tightness, shortness of breath and wheezing.     Cardiovascular: Negative for palpitations and leg swelling.  Gastrointestinal: Negative for nausea and vomiting.  Genitourinary: Negative for dysuria.  Musculoskeletal: Negative for joint swelling.  Skin: Negative for rash.  Neurological: Negative for headaches.  Hematological: Does not bruise/bleed easily.  Psychiatric/Behavioral: Negative for dysphoric mood. The patient is not nervous/anxious.        Objective:   Physical Exam Constitutional:  Obese female, no acute distress  HENT:  Nares patent without discharge  Oropharynx without exudate, palate and uvula are elongated with side wall narrowing.   Eyes:  Perrla, eomi, no scleral icterus  Neck:  No JVD, no TMG  Cardiovascular:  Normal rate, regular rhythm, no rubs or gallops.  No murmurs        Intact distal pulses  Pulmonary :  Normal breath sounds, no stridor or respiratory distress   No rales, rhonchi, or wheezing  Abdominal:  Soft, nondistended, bowel sounds present.  No tenderness noted.   Musculoskeletal:  No lower extremity edema noted.  Lymph Nodes:  No cervical lymphadenopathy noted  Skin:  No cyanosis noted  Neurologic:  Alert, appropriate, moves all 4 extremities without obvious deficit.         Assessment & Plan:

## 2011-01-02 NOTE — Patient Instructions (Signed)
Will try to get home sleep testing to evaluate for possible sleep apnea Work on weight loss

## 2011-01-02 NOTE — Assessment & Plan Note (Signed)
The patient gives a history for loud snoring, occasional choking arousals, but is not overly tired in the mornings upon arising.  She also notes fairly good alertness and energy levels during the day even with periods of inactivity.  I certainly think she is at risk for sleep disordered breathing, and may be more symptomatic than she is aware.  I think it would be reasonable to do a home sleep testing for evaluation, and the patient is agreeable to this approach.  I also encouraged her to work aggressively on weight loss.

## 2011-01-03 ENCOUNTER — Encounter: Payer: Self-pay | Admitting: Family Medicine

## 2011-01-06 ENCOUNTER — Encounter: Payer: Self-pay | Admitting: *Deleted

## 2011-01-16 ENCOUNTER — Telehealth: Payer: Self-pay | Admitting: Pulmonary Disease

## 2011-01-16 NOTE — Telephone Encounter (Signed)
Lm for pt TCB

## 2011-01-16 NOTE — Telephone Encounter (Signed)
Pt needs ov with me to discuss sleep study results.

## 2011-01-23 ENCOUNTER — Ambulatory Visit (INDEPENDENT_AMBULATORY_CARE_PROVIDER_SITE_OTHER): Payer: BC Managed Care – PPO | Admitting: Pulmonary Disease

## 2011-01-23 DIAGNOSIS — G4733 Obstructive sleep apnea (adult) (pediatric): Secondary | ICD-10-CM

## 2011-01-25 NOTE — Telephone Encounter (Signed)
Pt called back and is scheduled to see [facility] 02/03/11 at 9: 45 am

## 2011-02-01 ENCOUNTER — Ambulatory Visit: Payer: BC Managed Care – PPO | Admitting: Family Medicine

## 2011-02-03 ENCOUNTER — Encounter: Payer: Self-pay | Admitting: Pulmonary Disease

## 2011-02-03 ENCOUNTER — Ambulatory Visit (INDEPENDENT_AMBULATORY_CARE_PROVIDER_SITE_OTHER): Payer: BC Managed Care – PPO | Admitting: Pulmonary Disease

## 2011-02-03 VITALS — BP 138/84 | HR 78 | Temp 97.7°F | Ht 66.0 in | Wt 247.4 lb

## 2011-02-03 DIAGNOSIS — G4733 Obstructive sleep apnea (adult) (pediatric): Secondary | ICD-10-CM

## 2011-02-03 NOTE — Progress Notes (Signed)
  Subjective:    Patient ID: Jamie [name], female    DOB: 1955/03/31, 56 y.o.   MRN: [id number]  HPI The patient comes in today for follow up of her recent home sleep testing.  She was found to have moderate to severe obstructive sleep apnea, with an AHI of 37 events per hour.  I have reviewed the study with her in detail, and answered all of her questions.   Review of Systems  Constitutional: Negative for fever and unexpected weight change.  HENT: Negative for ear pain, nosebleeds, congestion, sore throat, rhinorrhea, sneezing, trouble swallowing, dental problem, postnasal drip and sinus pressure.   Eyes: Negative for redness and itching.  Respiratory: Negative for cough, chest tightness, shortness of breath and wheezing.   Cardiovascular: Negative for palpitations and leg swelling.  Gastrointestinal: Negative for nausea and vomiting.  Genitourinary: Negative for dysuria.  Musculoskeletal: Negative for joint swelling.  Skin: Negative for rash.  Neurological: Negative for headaches.  Hematological: Does not bruise/bleed easily.  Psychiatric/Behavioral: Negative for dysphoric mood. The patient is not nervous/anxious.        Objective:   Physical Exam Obese female in no acute distress Nose without purulence or discharge noted Lower extremities without significant edema Alert and oriented, moves all 4 extremities.       Assessment & Plan:

## 2011-02-03 NOTE — Patient Instructions (Signed)
Will start on cpap.  Please call if having tolerance issues. Work on weight loss followup with me in 5 weeks.

## 2011-02-03 NOTE — Assessment & Plan Note (Signed)
The patient has moderate to severe obstructive sleep apnea by her recent sleep testing.  I have reviewed the various treatment options for this, and feel that CPAP while working on weight loss would be optimal.  Since she is not overly symptomatic during the day, and does not have significant comorbid medical issues, she could take the next 6 months to work on weight loss alone.  After a long discussion, she has decided to try the CPAP while trying to decrease her weight. I will set the patient up on cpap at a moderate pressure level to allow for desensitization, and will troubleshoot the device over the next 4-6weeks if needed.  The pt is to call me if having issues with tolerance.  Will then optimize the pressure once patient is able to wear cpap on a consistent basis.

## 2011-02-16 ENCOUNTER — Other Ambulatory Visit: Payer: Self-pay | Admitting: *Deleted

## 2011-02-16 MED ORDER — MELOXICAM 7.5 MG PO TABS: 7.5000 mg | ORAL_TABLET | Freq: Every day | ORAL | Status: DC | PRN

## 2011-02-20 ENCOUNTER — Encounter: Payer: Self-pay | Admitting: Pulmonary Disease

## 2011-03-10 ENCOUNTER — Encounter: Payer: Self-pay | Admitting: Pulmonary Disease

## 2011-03-10 ENCOUNTER — Ambulatory Visit (INDEPENDENT_AMBULATORY_CARE_PROVIDER_SITE_OTHER): Payer: BC Managed Care – PPO | Admitting: Pulmonary Disease

## 2011-03-10 VITALS — BP 126/80 | HR 77 | Temp 97.9°F | Ht 66.0 in | Wt 245.2 lb

## 2011-03-10 DIAGNOSIS — G4733 Obstructive sleep apnea (adult) (pediatric): Secondary | ICD-10-CM

## 2011-03-10 NOTE — Patient Instructions (Signed)
Will have your machine put on auto mode to optimize your pressure.  Will let you know the results.  Work on weight loss followup with me in 39mo, but call if having tolerance issues.

## 2011-03-10 NOTE — Progress Notes (Signed)
  Subjective:    Patient ID: Jamie [name], female    DOB: March 01, 1955, 56 y.o.   MRN: [id number]  HPI Patient comes in today for followup of her obstructive sleep apnea.  She was started on CPAP at the last visit with a moderate pressure, and returns to check on her progress.  She is wearing the CPAP compliance, and overall is doing fairly well with the device.  She does feel that she needs more pressure at times, and will occasionally awaken with the mask off.  Despite wearing compliantly, she has not seen a big difference in her sleep or daytime alertness, but I have reminded her that we have yet to optimize her pressure.   Review of Systems  Constitutional: Negative for fever and unexpected weight change.  HENT: Negative for ear pain, nosebleeds, congestion, sore throat, rhinorrhea, sneezing, trouble swallowing, dental problem, postnasal drip and sinus pressure.   Eyes: Negative for redness and itching.  Respiratory: Negative for cough, chest tightness, shortness of breath and wheezing.   Cardiovascular: Negative for palpitations and leg swelling.  Gastrointestinal: Negative for nausea and vomiting.  Genitourinary: Negative for dysuria.  Musculoskeletal: Negative for joint swelling.  Skin: Negative for rash.  Neurological: Negative for headaches.  Hematological: Does not bruise/bleed easily.  Psychiatric/Behavioral: Negative for dysphoric mood. The patient is not nervous/anxious.        Objective:   Physical Exam Overweight female in no acute distress no skin breakdown or pressure necrosis from the CPAP mask Extremities without edema, cyanosis The patient is alert, does not appear overly sleepy, moves all 4 extremities.       Assessment & Plan:

## 2011-03-10 NOTE — Assessment & Plan Note (Signed)
The patient is wearing CPAP compliantly, but has yet to see a big change in her sleep or daytime alertness.  I have told her that we need to optimize her pressure at this point, and we'll do this on the auto set setting.  I have also encouraged her to work aggressively on weight loss.

## 2011-03-13 ENCOUNTER — Telehealth: Payer: Self-pay | Admitting: Pulmonary Disease

## 2011-03-13 DIAGNOSIS — G4733 Obstructive sleep apnea (adult) (pediatric): Secondary | ICD-10-CM

## 2011-03-13 NOTE — Telephone Encounter (Signed)
I spoke with pt and she states she is wanting her cpap machine but back on setting of 10. Pt states the auto ramps up to high and she can't sleep on the auto mode. She states the pressure would get so high she couldn't keep the seal on mask, is hard for her to exhale, and she was having pain in her rib cage. Please advise Dr. Gwenette Greet, thanks

## 2011-03-13 NOTE — Telephone Encounter (Signed)
Right now her pressure can fluctuate between 5-20 of pressure.  The fact that she is ramping up high tells me she needs high pressures to treat her worst events.  We can change auto to 5-14 and see if she can tolerate it better.  Would like to get some idea as to her current pressure needs.  See if she is willing to do this.

## 2011-03-13 NOTE — Telephone Encounter (Signed)
Patient calling back about previous message.

## 2011-03-14 NOTE — Telephone Encounter (Signed)
Spoke with pt and notified of recs per [facility]. She is okay with pressure change to 5-14.  Will send order to [facility].

## 2011-03-16 ENCOUNTER — Ambulatory Visit: Payer: BC Managed Care – PPO | Admitting: Pulmonary Disease

## 2011-04-05 ENCOUNTER — Ambulatory Visit: Payer: Self-pay | Admitting: Orthopedic Surgery

## 2011-05-04 ENCOUNTER — Other Ambulatory Visit: Payer: Self-pay | Admitting: *Deleted

## 2011-05-04 MED ORDER — METOPROLOL TARTRATE 25 MG PO TABS: 25.0000 mg | ORAL_TABLET | Freq: Every day | ORAL | Status: DC

## 2011-05-04 NOTE — Telephone Encounter (Signed)
Patient last cpx was 05-02-2010. No other appt scheduled please advise

## 2011-05-04 NOTE — Telephone Encounter (Signed)
Please go ahead and schedule PE with labs prior Will refill electronically

## 2011-05-05 NOTE — Telephone Encounter (Signed)
Patient advised as instructed via telephone, she stated that she will call back next week to scheduled CPX with labs prior.

## 2011-05-14 ENCOUNTER — Other Ambulatory Visit: Payer: Self-pay | Admitting: Pulmonary Disease

## 2011-05-14 DIAGNOSIS — G4733 Obstructive sleep apnea (adult) (pediatric): Secondary | ICD-10-CM

## 2011-05-22 ENCOUNTER — Encounter: Payer: Self-pay | Admitting: Pulmonary Disease

## 2011-06-05 ENCOUNTER — Other Ambulatory Visit: Payer: Self-pay | Admitting: Family Medicine

## 2011-06-05 NOTE — Telephone Encounter (Signed)
Rx called to the pharmacy.

## 2011-06-05 NOTE — Telephone Encounter (Signed)
Px written for call in   

## 2011-06-06 ENCOUNTER — Telehealth: Payer: Self-pay

## 2011-06-06 NOTE — Telephone Encounter (Signed)
Received prior auth form for Zolpidem  from [facility] and is on shelf in Dr Alba Cory in box for completion and then fax back to [phone].

## 2011-06-06 NOTE — Telephone Encounter (Signed)
Jamie [name] with [facility] left v/m for PA for Zolpidem 10 mg.[employer] [phone]. pt ID# [id number]. Called [employer] requested PA form faxed to office.Spoke with Carlyon Shadow and she will fax PA form. Pts case # is [id number].

## 2011-06-06 NOTE — Telephone Encounter (Signed)
Done and in IN box 

## 2011-06-07 NOTE — Telephone Encounter (Signed)
Letter of approval received and faxed to [place] [phone]. Placed on shelf for Dr Alba Cory signature and then PA form and approval letter to go for scanning.

## 2011-06-07 NOTE — Telephone Encounter (Signed)
Completed form faxed to [employer] at [phone] and form given to Dale.

## 2011-06-09 ENCOUNTER — Telehealth: Payer: Self-pay | Admitting: *Deleted

## 2011-06-09 NOTE — Telephone Encounter (Signed)
Received fax from [facility] for ostomy supplies, spoke with patient and she does use this company.  Form in your IN box.

## 2011-06-09 NOTE — Telephone Encounter (Signed)
Completed form faxed to [phone].

## 2011-06-09 NOTE — Telephone Encounter (Signed)
Done and in IN box 

## 2011-08-29 ENCOUNTER — Other Ambulatory Visit: Payer: Self-pay | Admitting: Family Medicine

## 2011-09-07 ENCOUNTER — Ambulatory Visit: Payer: BC Managed Care – PPO | Admitting: Pulmonary Disease

## 2011-09-20 ENCOUNTER — Encounter: Payer: Self-pay | Admitting: Pulmonary Disease

## 2011-09-20 ENCOUNTER — Ambulatory Visit (INDEPENDENT_AMBULATORY_CARE_PROVIDER_SITE_OTHER): Payer: BC Managed Care – PPO | Admitting: Pulmonary Disease

## 2011-09-20 VITALS — BP 146/92 | HR 86 | Temp 98.3°F | Ht 66.0 in | Wt 245.2 lb

## 2011-09-20 DIAGNOSIS — G4733 Obstructive sleep apnea (adult) (pediatric): Secondary | ICD-10-CM

## 2011-09-20 NOTE — Patient Instructions (Addendum)
Will set your machine on fixed pressure of 14cm, and will get dme to show you how to set ramp Increase heat on humidifier if you feel you are not getting enough moisture. Work on weight loss followup with me in one year if doing well, but call me if the above changes are not helping.

## 2011-09-20 NOTE — Assessment & Plan Note (Signed)
The patient is wearing CPAP nightly, but is pulling her mask off during sleep.  I suspect this is because of inadequate pressure at times, and the patient admits that she does feel the pressure is not enough at times during the night.  She is having no issues with her mask fit, and therefore I would like to put her on a fixed pressure as a trial to see if this improves.  She is to give me feedback on this.  I've also encouraged her to work aggressively on weight loss.

## 2011-09-20 NOTE — Progress Notes (Signed)
  Subjective:    Patient ID: Jamie [name], female    DOB: February 08, 1955, 56 y.o.   MRN: [id number]  HPI The patient comes in today for followup of her known obstructive sleep apnea.  She is wearing CPAP compliantly, but feels that her pressure may be too low at times.  She is currently on the auto setting.  She does awaken on occasions with the CPAP mask lying in the floor, and is unsure why.  She is having no issues with mask comfort or fit.  When she wears cpap for a period of time, she does feel that it helps her sleep.    Review of Systems  Constitutional: Negative for fever and unexpected weight change.  HENT: Negative for ear pain, nosebleeds, congestion, sore throat, rhinorrhea, sneezing, trouble swallowing, dental problem, postnasal drip and sinus pressure.   Eyes: Negative for redness and itching.  Respiratory: Negative for cough, chest tightness, shortness of breath and wheezing.   Cardiovascular: Negative for palpitations and leg swelling.  Gastrointestinal: Negative for nausea and vomiting.  Genitourinary: Negative for dysuria.  Musculoskeletal: Negative for joint swelling.  Skin: Negative for rash.  Neurological: Negative for headaches.  Hematological: Does not bruise/bleed easily.  Psychiatric/Behavioral: Negative for dysphoric mood. The patient is not nervous/anxious.        Objective:   Physical Exam Obese female in no acute distress Nose without purulence or discharge noted No skin breakdown or pressure necrosis from the CPAP mask Lower extremities with mild edema, cyanosis Alert and oriented, moves all 4 extremities.       Assessment & Plan:

## 2011-09-24 ENCOUNTER — Telehealth: Payer: Self-pay | Admitting: Family Medicine

## 2011-09-24 DIAGNOSIS — R7309 Other abnormal glucose: Secondary | ICD-10-CM

## 2011-09-24 DIAGNOSIS — Z0000 Encounter for general adult medical examination without abnormal findings: Secondary | ICD-10-CM | POA: Insufficient documentation

## 2011-09-24 NOTE — Telephone Encounter (Signed)
Message copied by Abner Greenspan on Sun Sep 24, 2011  1:03 PM ------      Message from: Marchia Bond      Created: Mon Sep 18, 2011  9:42 AM      Regarding: Cpx labs Tues 9/3       Please order  future cpx labs for pt's upcomming lab appt.      Thanks      Aniceto Boss

## 2011-09-26 ENCOUNTER — Other Ambulatory Visit (INDEPENDENT_AMBULATORY_CARE_PROVIDER_SITE_OTHER): Payer: BC Managed Care – PPO

## 2011-09-26 DIAGNOSIS — R7309 Other abnormal glucose: Secondary | ICD-10-CM

## 2011-09-26 DIAGNOSIS — Z0000 Encounter for general adult medical examination without abnormal findings: Secondary | ICD-10-CM

## 2011-09-26 LAB — CBC WITH DIFFERENTIAL/PLATELET
Basophils Relative: 0.4 % (ref 0.0–3.0)
Eosinophils Absolute: 0.4 10*3/uL (ref 0.0–0.7)
Eosinophils Relative: 5.5 % — ABNORMAL HIGH (ref 0.0–5.0)
HCT: 44.5 % (ref 36.0–46.0)
Lymphs Abs: 1.7 10*3/uL (ref 0.7–4.0)
MCHC: 34 g/dL (ref 30.0–36.0)
MCV: 92.1 fl (ref 78.0–100.0)
Monocytes Absolute: 0.5 10*3/uL (ref 0.1–1.0)
RBC: 4.84 Mil/uL (ref 3.87–5.11)
WBC: 7.5 10*3/uL (ref 4.5–10.5)

## 2011-09-26 LAB — LIPID PANEL
Cholesterol: 178 mg/dL (ref 0–200)
HDL: 48.6 mg/dL (ref >39.00)
Triglycerides: 195 mg/dL — ABNORMAL HIGH (ref 0.0–149.0)
VLDL: 39 mg/dL (ref 0.0–40.0)

## 2011-09-26 LAB — COMPREHENSIVE METABOLIC PANEL
AST: 36 U/L (ref 0–37)
Alkaline Phosphatase: 64 U/L (ref 39–117)
BUN: 11 mg/dL (ref 6–23)
Creatinine, Ser: 1 mg/dL (ref 0.4–1.2)
Glucose, Bld: 124 mg/dL — ABNORMAL HIGH (ref 70–99)
Total Bilirubin: 1 mg/dL (ref 0.3–1.2)

## 2011-09-26 LAB — HEMOGLOBIN A1C: Hgb A1c MFr Bld: 6.1 % (ref 4.6–6.5)

## 2011-10-02 ENCOUNTER — Ambulatory Visit (INDEPENDENT_AMBULATORY_CARE_PROVIDER_SITE_OTHER): Payer: BC Managed Care – PPO | Admitting: Family Medicine

## 2011-10-02 ENCOUNTER — Encounter: Payer: Self-pay | Admitting: Family Medicine

## 2011-10-02 VITALS — BP 123/80 | HR 66 | Temp 98.0°F | Ht 66.0 in | Wt 247.5 lb

## 2011-10-02 DIAGNOSIS — E669 Obesity, unspecified: Secondary | ICD-10-CM

## 2011-10-02 DIAGNOSIS — R198 Other specified symptoms and signs involving the digestive system and abdomen: Secondary | ICD-10-CM | POA: Insufficient documentation

## 2011-10-02 DIAGNOSIS — Z23 Encounter for immunization: Secondary | ICD-10-CM

## 2011-10-02 DIAGNOSIS — R7309 Other abnormal glucose: Secondary | ICD-10-CM

## 2011-10-02 DIAGNOSIS — Z1231 Encounter for screening mammogram for malignant neoplasm of breast: Secondary | ICD-10-CM

## 2011-10-02 DIAGNOSIS — I10 Essential (primary) hypertension: Secondary | ICD-10-CM

## 2011-10-02 DIAGNOSIS — Z0000 Encounter for general adult medical examination without abnormal findings: Secondary | ICD-10-CM

## 2011-10-02 MED ORDER — METOPROLOL TARTRATE 25 MG PO TABS: 25.0000 mg | ORAL_TABLET | Freq: Every day | ORAL | Status: DC

## 2011-10-02 NOTE — Assessment & Plan Note (Signed)
bp in fair control at this time  No changes needed  Disc lifstyle change with low sodium diet and exercise   Reviewed labs with pt

## 2011-10-02 NOTE — Assessment & Plan Note (Signed)
Discussed how this problem influences overall health and the risks it imposes  Reviewed plan for weight loss with lower calorie diet (via better food choices and also portion control or program like weight watchers) and exercise building up to or more than 30 minutes 5 days per week including some aerobic activity    Disc plan for low impact exercise for knee and foot problems

## 2011-10-02 NOTE — Assessment & Plan Note (Signed)
Reviewed health habits including diet and exercise and skin cancer prevention Also reviewed health mt list, fam hx and immunizations  Rev wellness labs in detail

## 2011-10-02 NOTE — Assessment & Plan Note (Signed)
Due in dec at [facility] Pt will schedule this herself Nl breast exam  Enc self exams monthly

## 2011-10-02 NOTE — Assessment & Plan Note (Signed)
Pt has a rectal pouch constructed of small intestine after her colectomy- having problematic mucous discharge Will ref to general surg for eval

## 2011-10-02 NOTE — Assessment & Plan Note (Signed)
This is stable with a1c 6.1 Disc imp of low glycemic diet and exercise in detail

## 2011-10-02 NOTE — Patient Instructions (Addendum)
Do not forget to schedule your mammogram at [facility] for december We will refer you to gen surgeon for the problem you are having Start going to weight watchers meetings Consider low impact exercise like recumbant bike or water aerobics / swimming  Labs look pretty good

## 2011-10-02 NOTE — Progress Notes (Signed)
Subjective:    Patient ID: Jamie [name], female    DOB: 1955-12-13, 56 y.o.   MRN: [id number]  HPI Here for health maintenance exam and to review chronic medical problems    Has had a good summer overall  Feeling ok   Got started on cpap for sleep apnea  Is going well so far - increased pressure 2 years ago  Also retired from her job- happy about that   UC- has small intestine pouch Now having rectal discharge - without muscle control Tried kegel exercises  She needs to go back to GI  Did injure her leg also - knee problems -went to PT  Also has problem with plantar fasciitis -walking is problematic - tried different shoes / nsaid / stretches  Wt is up 2 lb with bmi of 39 Obese- aware she needs to loose wt Diet- same , ==still eats more than she should- lost and then gained again , following wt watchers but needs to get back to meetings  Exercise- is a challenge. Needs low impact now , has considered water aerobics   Hyperglycemia Lab Results  Component Value Date   HGBA1C 6.1 09/26/2011    This is stable  bp is stable today  No cp or palpitations or headaches or edema  No side effects to medicines  BP Readings from Last 3 Encounters:  10/02/11 134/72  09/20/11 146/92  03/10/11 126/80      Colon-- colectomy due to UC in the past   mammo 12/12 Self exam-no lumps or changes  Had total hyst  No gyn problems No hx of abnormal paps  Lab Results  Component Value Date   CHOL 178 09/26/2011   CHOL 185 04/25/2010   CHOL 168 10/12/2008   Lab Results  Component Value Date   HDL 48.60 09/26/2011   HDL 47.10 04/25/2010   HDL 35.70* 10/12/2008   Lab Results  Component Value Date   LDLCALC 90 09/26/2011   LDLCALC 101* 10/12/2008   Lab Results  Component Value Date   TRIG 195.0* 09/26/2011   TRIG 211.0* 04/25/2010   TRIG 159.0* 10/12/2008   Lab Results  Component Value Date   CHOLHDL 4 09/26/2011   CHOLHDL 4 04/25/2010   CHOLHDL 5 10/12/2008   Lab Results  Component Value  Date   LDLDIRECT 110.3 04/25/2010   cholesterol is overall improved    Review of Systems Review of Systems  Constitutional: Negative for fever, appetite change, fatigue and unexpected weight change.  Eyes: Negative for pain and visual disturbance.  Respiratory: Negative for cough and shortness of breath.   Cardiovascular: Negative for cp or palpitations    Gastrointestinal: Negative for nausea, diarrhea and constipation.  Genitourinary: Negative for urgency and frequency. pos for mucous discharge from her rectal pouch, neg for problems with her stoma/colostomy bag Skin: Negative for pallor or rash   Neurological: Negative for weakness, light-headedness, numbness and headaches.  Hematological: Negative for adenopathy. Does not bruise/bleed easily.  Psychiatric/Behavioral: Negative for dysphoric mood. The patient is not nervous/anxious.         Objective:   Physical Exam  Constitutional: She appears well-developed and well-nourished. No distress.       obese and well appearing   HENT:  Head: Normocephalic and atraumatic.  Right Ear: External ear normal.  Left Ear: External ear normal.  Nose: Nose normal.  Mouth/Throat: Oropharynx is clear and moist.  Eyes: Conjunctivae normal and EOM are normal. Pupils are equal, round, and reactive to  light. No scleral icterus.  Neck: Normal range of motion. Neck supple. No JVD present. Carotid bruit is not present. No thyromegaly present.  Cardiovascular: Normal rate, regular rhythm, normal heart sounds and intact distal pulses.  Exam reveals no gallop.   Pulmonary/Chest: Effort normal and breath sounds normal. No respiratory distress. She has no wheezes.  Abdominal: Soft. Bowel sounds are normal. She exhibits no distension, no abdominal bruit and no mass. There is no tenderness.       Colostomy/stoma appear to be normal   Genitourinary: No breast swelling, tenderness, discharge or bleeding.       Breast exam: No mass, nodules, thickening,  tenderness, bulging, retraction, inflamation, nipple discharge or skin changes noted.  No axillary or clavicular LA.  Chaperoned exam.    Musculoskeletal: Normal range of motion. She exhibits no edema and no tenderness.  Lymphadenopathy:    She has no cervical adenopathy.  Neurological: She is alert. She has normal reflexes. No cranial nerve deficit. She exhibits normal muscle tone. Coordination normal.  Skin: Skin is warm and dry. No rash noted. No erythema. No pallor.  Psychiatric: She has a normal mood and affect.          Assessment & Plan:

## 2011-10-26 ENCOUNTER — Other Ambulatory Visit: Payer: Self-pay | Admitting: Family Medicine

## 2011-10-26 NOTE — Telephone Encounter (Signed)
Px written for call in   

## 2011-10-26 NOTE — Telephone Encounter (Signed)
Called in Rx as prescribed

## 2011-10-26 NOTE — Telephone Encounter (Signed)
Ok to refill 

## 2011-11-10 ENCOUNTER — Ambulatory Visit (INDEPENDENT_AMBULATORY_CARE_PROVIDER_SITE_OTHER): Payer: BC Managed Care – PPO | Admitting: General Surgery

## 2011-11-13 ENCOUNTER — Encounter (INDEPENDENT_AMBULATORY_CARE_PROVIDER_SITE_OTHER): Payer: Self-pay | Admitting: General Surgery

## 2011-11-13 ENCOUNTER — Ambulatory Visit (INDEPENDENT_AMBULATORY_CARE_PROVIDER_SITE_OTHER): Payer: BC Managed Care – PPO | Admitting: General Surgery

## 2011-11-13 VITALS — BP 162/70 | HR 96 | Temp 97.6°F | Resp 18 | Ht 66.0 in | Wt 239.4 lb

## 2011-11-13 DIAGNOSIS — K5190 Ulcerative colitis, unspecified, without complications: Secondary | ICD-10-CM

## 2011-11-13 NOTE — Progress Notes (Signed)
Subjective:     Patient ID: Jamie [name], female   DOB: Oct 04, 1955, 56 y.o.   MRN: [id number]  HPI Pt having significant mucous per rectum s/p TAC with IPAA 20 years ago.  She has always had a small amount, but it has increased recently.  She is unable to sense it and cannot control it.  Much of it occurs at night.  She denies rectal pain and bleeding.    Review of Systems  All other systems reviewed and are negative.      Objective:   Physical Exam  Constitutional: She is oriented to person, place, and time. She appears well-developed and well-nourished. No distress.  HENT:  Head: Normocephalic and atraumatic.  Eyes: Pupils are equal, round, and reactive to light. No scleral icterus.  Cardiovascular: Normal rate and regular rhythm.   Pulmonary/Chest: Effort normal.  Abdominal: Soft. She exhibits no distension. There is no tenderness.       Ileostomy pink with pasty stool in place.    Neurological: She is alert and oriented to person, place, and time.  Skin: Skin is warm and dry. She is not diaphoretic.  Psychiatric: She has a normal mood and affect. Her behavior is normal. Judgment and thought content normal.       Assessment/Plan:     COLITIS, ULCERATIVE NOS Think mucous is likely physiologic.  Without pain and/or bleeding, think pouchitis is unlikely and therefore would not do empiric antibiotics.   Would refer to Dr. Marcello Moores (colorectal surgery) for evaluation.    She will likely scope patient to eval pouch.

## 2011-11-13 NOTE — Patient Instructions (Addendum)
Once I hear from Dr. Marcello Moores, we may call in some antibiotics.    We will make appointment with her.

## 2011-11-13 NOTE — Assessment & Plan Note (Signed)
Think mucous is likely physiologic.  Without pain and/or bleeding, think pouchitis is unlikely and therefore would not do empiric antibiotics.   Would refer to Dr. Marcello Moores (colorectal surgery) for evaluation.    She will likely scope patient to eval pouch.

## 2011-11-21 ENCOUNTER — Encounter (INDEPENDENT_AMBULATORY_CARE_PROVIDER_SITE_OTHER): Payer: Self-pay | Admitting: General Surgery

## 2011-11-21 ENCOUNTER — Encounter (INDEPENDENT_AMBULATORY_CARE_PROVIDER_SITE_OTHER): Payer: Self-pay

## 2011-11-21 ENCOUNTER — Ambulatory Visit (INDEPENDENT_AMBULATORY_CARE_PROVIDER_SITE_OTHER): Payer: BC Managed Care – PPO | Admitting: General Surgery

## 2011-11-21 VITALS — BP 138/78 | HR 76 | Temp 97.8°F | Ht 66.0 in | Wt 242.6 lb

## 2011-11-21 DIAGNOSIS — K624 Stenosis of anus and rectum: Secondary | ICD-10-CM | POA: Insufficient documentation

## 2011-11-21 NOTE — Progress Notes (Signed)
Chief Complaint  Patient presents with  . Pre-op Exam    eval rectal discharge  . Rectal Problems    HISTORY: Jamie [name] is a 56 y.o. female who presents to the office with increasing blockage of her j pouch.  She is s/p total proctocolectomy and j pouch creation with mucosectomy at [facility] in 1993.  She never had her loop ileostomy reversed because her quality of life was so much better with her ileostomy.  She states that she occasionally has mucus that passes rectally but over the past few months, she has noticed that she gets pelvic cramping and massive discharge.  She denies any fevers and only pink tinged mucus drainage with no frank bleeding.  She has done well with her ostomy and denies any pouching problems.  Past Medical History  Diagnosis Date  . Arthritis   . Gall stones   . Colitis     with colectomy  . HTN (hypertension)   . Obesity   . Bowel obstruction     repeated (? possible adhesions) neg EGD  . Rosacea   . Hyperglycemia     mild      Past Surgical History  Procedure Date  . Total abdominal hysterectomy 05/2006    for abscessed ovaries  . Colectomy 1993      Current Outpatient Prescriptions  Medication Sig Dispense Refill  . Blood Glucose Monitoring Suppl (GLUCOMETER ELITE CLASSIC) KIT by Does not apply route daily. Check sugar once daily and as needed for DM2 250.0       . Cinnamon ([facility] CINNAMON) 500 MG capsule Take 1,000 mg by mouth daily.        . fluticasone (FLOVENT HFA) 220 MCG/ACT inhaler 2 puffs topically to colostomy site once every 3 days.       Marland Kitchen glucose blood test strip 1 each by Other route daily. Use daily and as needed for DM@@ 250.0       . hyoscyamine (LEVSIN SL) 0.125 MG SL tablet Take 1 tablet (0.125 mg total) by mouth every 4 (four) hours as needed. For abdominal cramps  30 tablet  1  . metoprolol tartrate (LOPRESSOR) 25 MG tablet Take 1 tablet (25 mg total) by mouth daily.  90 tablet  3  . Multiple Vitamin (MULTIVITAMIN) tablet Take 1  tablet by mouth daily.        . naproxen sodium (ANAPROX) 220 MG tablet Take 220 mg by mouth as needed.      Marland Kitchen omeprazole (PRILOSEC) 20 MG capsule Take 20 mg by mouth daily.        Marland Kitchen zolpidem (AMBIEN) 10 MG tablet TAKE 1 TABLET BY MOUTH AT BEDTIME AS NEEDED  30 tablet  0      Allergies  Allergen Reactions  . Lisinopril     REACTION: felt bad/ stomach hurt/ weak and tired      Family History  Problem Relation Age of Onset  . Cancer Father     colon  . Liver cancer Father     resection secondary to mets  . Hyperlipidemia Father   . Hypertension Father   . Allergies Father   . Ulcerative colitis Father   . Hypertension Mother   . Hyperlipidemia Mother   . Cirrhosis Mother     NASH  . Diabetes Mother   . Allergies Brother       History   Social History  . Marital Status: Married    Spouse Name: N/A    Number  of Children: 3  . Years of Education: N/A   Occupational History  . Guardian Ad Litem    Social History Main Topics  . Smoking status: Never Smoker   . Smokeless tobacco: None  . Alcohol Use: No  . Drug Use: No  . Sexually Active: None   Other Topics Concern  . None   Social History Narrative  . None       REVIEW OF SYSTEMS - PERTINENT POSITIVES ONLY: Review of Systems - General ROS: negative for - chills or fever Hematological and Lymphatic ROS: negative for - bleeding problems or blood clots Respiratory ROS: no cough, shortness of breath, or wheezing Cardiovascular ROS: no chest pain or dyspnea on exertion Gastrointestinal ROS: no abdominal pain, change in bowel habits, or black or bloody stools Genito-Urinary ROS: no dysuria, trouble voiding, or hematuria  EXAM: Filed Vitals:   11/21/11 0942  BP: 138/78  Pulse: 76  Temp: 97.8 F (36.6 C)      Gen:  No acute distress.  Well nourished and well groomed.   Neurological: Alert and oriented to person, place, and time. Coordination normal.  Head: Normocephalic and atraumatic.  Eyes:  Conjunctivae are normal. Pupils are equal, round, and reactive to light. No scleral icterus.  Neck: Normal range of motion. Neck supple. No tracheal deviation or thyromegaly present.  No cervical lymphadenopathy. Cardiovascular: Normal rate, regular rhythm, normal heart sounds and intact distal pulses Respiratory: Effort normal.  No respiratory distress. Breath sounds normal.  No wheezes, rales or rhonchi.  GI: Soft. Bowel sounds are normal. The abdomen is soft and nontender.  There is no rebound and no guarding. There are no obvious hernias. Musculoskeletal: Normal range of motion. Extremities are nontender.  Skin: Skin is warm and dry. No rash noted. No diaphoresis. No erythema. No pallor. No clubbing, cyanosis, or edema.   Psychiatric: Normal mood and affect. Behavior is normal. Judgment and thought content normal.   Anorectal Exam: anal stricture, unable to perform DRE     ASSESSMENT AND PLAN:  Jamie [name] is a 56 y.o. female who presents to my office with an anorectal stricture.  This is most likely due to disuse and anal scarring.  She is not interested in reversing her ostomy at this time, so our goal is only to make it easier for her j pouch to empty mucus.  I would like to perform a dilation and pouchoscopy in the OR.  We have discussed the risks and benefits of this and she has agreed to proceed.   Rosario Adie, MD Colon and Rectal Surgery / [facility]      Visit Diagnoses: 1. Stricture of anal canal     Primary Care Physician: Loura Pardon, MD

## 2011-11-21 NOTE — Patient Instructions (Signed)
I believe you have an anal stricture.  I would like to perform an anal exam under anesthesia with pouchoscopy.  I will dilate your anal stricture at that time.

## 2011-11-21 NOTE — Progress Notes (Signed)
No bowel prep is needed for the planned EUA, Anal dilatation, and Pouchoscopy per Dr Marcello Moores.

## 2011-11-22 ENCOUNTER — Other Ambulatory Visit: Payer: Self-pay | Admitting: Family Medicine

## 2011-11-24 ENCOUNTER — Encounter (HOSPITAL_BASED_OUTPATIENT_CLINIC_OR_DEPARTMENT_OTHER): Payer: Self-pay | Admitting: *Deleted

## 2011-11-24 NOTE — Progress Notes (Signed)
Orders pending from Dr Debbe Bales arrive at [facility] [place] ,Ekg on arrival-Npo after Mn -will take prilosec,metoprolol with sip water only-instructed to bring CPAP and mask and ileostomy supplies.

## 2011-12-01 ENCOUNTER — Other Ambulatory Visit (INDEPENDENT_AMBULATORY_CARE_PROVIDER_SITE_OTHER): Payer: Self-pay | Admitting: General Surgery

## 2011-12-01 ENCOUNTER — Telehealth (INDEPENDENT_AMBULATORY_CARE_PROVIDER_SITE_OTHER): Payer: Self-pay | Admitting: General Surgery

## 2011-12-01 NOTE — Telephone Encounter (Signed)
Jamie [name] called to request orders placed in epic for upcoming surgery 12-06-11/ gy

## 2011-12-06 ENCOUNTER — Encounter (HOSPITAL_BASED_OUTPATIENT_CLINIC_OR_DEPARTMENT_OTHER): Payer: Self-pay | Admitting: Anesthesiology

## 2011-12-06 ENCOUNTER — Encounter (HOSPITAL_BASED_OUTPATIENT_CLINIC_OR_DEPARTMENT_OTHER): Payer: Self-pay | Admitting: *Deleted

## 2011-12-06 ENCOUNTER — Encounter (HOSPITAL_BASED_OUTPATIENT_CLINIC_OR_DEPARTMENT_OTHER)
Admission: RE | Disposition: A | Discharge status: Home / Self Care | Payer: Self-pay | Source: Ambulatory Visit | Attending: General Surgery

## 2011-12-06 ENCOUNTER — Other Ambulatory Visit: Payer: Self-pay

## 2011-12-06 ENCOUNTER — Ambulatory Visit (HOSPITAL_BASED_OUTPATIENT_CLINIC_OR_DEPARTMENT_OTHER): Payer: BC Managed Care – PPO | Admitting: Anesthesiology

## 2011-12-06 ENCOUNTER — Ambulatory Visit (HOSPITAL_BASED_OUTPATIENT_CLINIC_OR_DEPARTMENT_OTHER)
Admission: RE | Admit: 2011-12-06 | Discharge: 2011-12-06 | Disposition: A | Discharge status: Home / Self Care | Payer: BC Managed Care – PPO | Source: Ambulatory Visit | Attending: General Surgery | Admitting: General Surgery

## 2011-12-06 DIAGNOSIS — G4730 Sleep apnea, unspecified: Secondary | ICD-10-CM | POA: Insufficient documentation

## 2011-12-06 DIAGNOSIS — K9413 Enterostomy malfunction: Secondary | ICD-10-CM | POA: Insufficient documentation

## 2011-12-06 DIAGNOSIS — K5669 Other intestinal obstruction: Secondary | ICD-10-CM | POA: Insufficient documentation

## 2011-12-06 DIAGNOSIS — K624 Stenosis of anus and rectum: Secondary | ICD-10-CM | POA: Insufficient documentation

## 2011-12-06 DIAGNOSIS — K219 Gastro-esophageal reflux disease without esophagitis: Secondary | ICD-10-CM | POA: Insufficient documentation

## 2011-12-06 DIAGNOSIS — IMO0002 Reserved for concepts with insufficient information to code with codable children: Secondary | ICD-10-CM

## 2011-12-06 DIAGNOSIS — Z79899 Other long term (current) drug therapy: Secondary | ICD-10-CM | POA: Insufficient documentation

## 2011-12-06 DIAGNOSIS — I10 Essential (primary) hypertension: Secondary | ICD-10-CM | POA: Insufficient documentation

## 2011-12-06 DIAGNOSIS — K9403 Colostomy malfunction: Secondary | ICD-10-CM | POA: Insufficient documentation

## 2011-12-06 DIAGNOSIS — Y832 Surgical operation with anastomosis, bypass or graft as the cause of abnormal reaction of the patient, or of later complication, without mention of misadventure at the time of the procedure: Secondary | ICD-10-CM | POA: Insufficient documentation

## 2011-12-06 HISTORY — DX: Sleep apnea, unspecified: G47.30

## 2011-12-06 HISTORY — PX: EXAMINATION UNDER ANESTHESIA: SHX1540

## 2011-12-06 HISTORY — PX: COLOSTOMY REVISION: SHX5232

## 2011-12-06 SURGERY — EXAM UNDER ANESTHESIA
Anesthesia: General | Site: Rectum | Laterality: Right | Wound class: Clean Contaminated

## 2011-12-06 MED ORDER — OXYCODONE-ACETAMINOPHEN 5-325 MG PO TABS: 1.0000 | ORAL_TABLET | ORAL | Status: DC | PRN

## 2011-12-06 MED ORDER — PROMETHAZINE HCL 25 MG/ML IJ SOLN
6.2500 mg | INTRAMUSCULAR | Status: DC | PRN
  Filled 2011-12-06: qty 1

## 2011-12-06 MED ORDER — SODIUM CHLORIDE 0.9 % IJ SOLN
3.0000 mL | Freq: Two times a day (BID) | INTRAMUSCULAR | Status: DC
  Filled 2011-12-06: qty 3

## 2011-12-06 MED ORDER — SODIUM CHLORIDE 0.9 % IJ SOLN
3.0000 mL | INTRAMUSCULAR | Status: DC | PRN
  Filled 2011-12-06: qty 3

## 2011-12-06 MED ORDER — LACTATED RINGERS IV SOLN
INTRAVENOUS | Status: DC
  Administered 2011-12-06: 09:00:00 via INTRAVENOUS
  Filled 2011-12-06: qty 1000

## 2011-12-06 MED ORDER — ONDANSETRON HCL 4 MG/2ML IJ SOLN
INTRAMUSCULAR | Status: DC | PRN
  Administered 2011-12-06: 4 mg via INTRAVENOUS

## 2011-12-06 MED ORDER — SODIUM CHLORIDE 0.9 % IV SOLN
250.0000 mL | INTRAVENOUS | Status: DC | PRN
  Filled 2011-12-06: qty 250

## 2011-12-06 MED ORDER — ACETAMINOPHEN 650 MG RE SUPP
650.0000 mg | RECTAL | Status: DC | PRN
  Filled 2011-12-06: qty 1

## 2011-12-06 MED ORDER — ACETAMINOPHEN 325 MG PO TABS
650.0000 mg | ORAL_TABLET | ORAL | Status: DC | PRN
  Filled 2011-12-06: qty 2

## 2011-12-06 MED ORDER — MIDAZOLAM HCL 5 MG/5ML IJ SOLN
INTRAMUSCULAR | Status: DC | PRN
  Administered 2011-12-06: 2 mg via INTRAVENOUS

## 2011-12-06 MED ORDER — BUPIVACAINE-EPINEPHRINE 0.5% -1:200000 IJ SOLN
INTRAMUSCULAR | Status: DC | PRN
  Administered 2011-12-06: 10 mL

## 2011-12-06 MED ORDER — LACTATED RINGERS IV SOLN
INTRAVENOUS | Status: DC
  Filled 2011-12-06: qty 1000

## 2011-12-06 MED ORDER — LACTATED RINGERS IV SOLN
INTRAVENOUS | Status: DC | PRN
  Administered 2011-12-06 (×2): via INTRAVENOUS

## 2011-12-06 MED ORDER — LIDOCAINE HCL 1 % IJ SOLN
INTRAMUSCULAR | Status: DC | PRN
  Administered 2011-12-06: 10 mL

## 2011-12-06 MED ORDER — OXYCODONE-ACETAMINOPHEN 5-325 MG PO TABS
1.0000 | ORAL_TABLET | ORAL | Status: DC | PRN
  Administered 2011-12-06: 1 via ORAL
  Filled 2011-12-06: qty 1

## 2011-12-06 MED ORDER — KETOROLAC TROMETHAMINE 30 MG/ML IJ SOLN
INTRAMUSCULAR | Status: DC | PRN
  Administered 2011-12-06: 30 mg via INTRAVENOUS

## 2011-12-06 MED ORDER — ONDANSETRON HCL 4 MG/2ML IJ SOLN
4.0000 mg | Freq: Four times a day (QID) | INTRAMUSCULAR | Status: DC | PRN
  Filled 2011-12-06: qty 2

## 2011-12-06 MED ORDER — CEFAZOLIN SODIUM-DEXTROSE 2-3 GM-% IV SOLR
INTRAVENOUS | Status: DC | PRN
  Administered 2011-12-06: 2 g via INTRAVENOUS

## 2011-12-06 MED ORDER — LIDOCAINE HCL (CARDIAC) 20 MG/ML IV SOLN
INTRAVENOUS | Status: DC | PRN
  Administered 2011-12-06: 75 mg via INTRAVENOUS

## 2011-12-06 MED ORDER — DEXAMETHASONE SODIUM PHOSPHATE 4 MG/ML IJ SOLN
INTRAMUSCULAR | Status: DC | PRN
  Administered 2011-12-06: 10 mg via INTRAVENOUS

## 2011-12-06 MED ORDER — OXYCODONE HCL 5 MG PO TABS
5.0000 mg | ORAL_TABLET | ORAL | Status: DC | PRN
  Filled 2011-12-06: qty 2

## 2011-12-06 MED ORDER — FENTANYL CITRATE 0.05 MG/ML IJ SOLN
INTRAMUSCULAR | Status: DC | PRN
  Administered 2011-12-06: 25 ug via INTRAVENOUS
  Administered 2011-12-06: 100 ug via INTRAVENOUS
  Administered 2011-12-06 (×3): 25 ug via INTRAVENOUS
  Administered 2011-12-06 (×2): 50 ug via INTRAVENOUS

## 2011-12-06 MED ORDER — MORPHINE SULFATE 2 MG/ML IJ SOLN
1.0000 mg | INTRAMUSCULAR | Status: DC | PRN
  Filled 2011-12-06: qty 1

## 2011-12-06 MED ORDER — PROPOFOL 10 MG/ML IV BOLUS
INTRAVENOUS | Status: DC | PRN
  Administered 2011-12-06: 250 mg via INTRAVENOUS

## 2011-12-06 MED ORDER — 0.9 % SODIUM CHLORIDE (POUR BTL) OPTIME
TOPICAL | Status: DC | PRN
  Administered 2011-12-06: 500 mL

## 2011-12-06 SURGICAL SUPPLY — 31 items
CANISTER SUCTION 2500CC (MISCELLANEOUS) ×3 IMPLANT
CATH ROBINSON RED A/P 14FR (CATHETERS) ×3 IMPLANT
CATH ROBINSON RED A/P 16FR (CATHETERS) ×3 IMPLANT
CLOTH BEACON ORANGE TIMEOUT ST (SAFETY) ×3 IMPLANT
COVER TABLE BACK 60X90 (DRAPES) ×3 IMPLANT
DRAPE LAPAROTOMY TRNSV 102X78 (DRAPE) ×3 IMPLANT
ELECT BLADE 6.5 .24CM SHAFT (ELECTRODE) ×3 IMPLANT
ELECT REM PT RETURN 9FT ADLT (ELECTROSURGICAL) ×3
ELECTRODE REM PT RTRN 9FT ADLT (ELECTROSURGICAL) ×2 IMPLANT
GAUZE SPONGE 4X4 12PLY STRL LF (GAUZE/BANDAGES/DRESSINGS) ×3 IMPLANT
GLOVE BIO SURGEON STRL SZ 6.5 (GLOVE) ×9 IMPLANT
GLOVE ECLIPSE 6.0 STRL STRAW (GLOVE) ×3 IMPLANT
GOWN STRL REIN 2XL LVL4 (GOWN DISPOSABLE) ×3 IMPLANT
GOWN STRL REIN XL XLG (GOWN DISPOSABLE) ×6 IMPLANT
LUBRICANT JELLY K Y 4OZ (MISCELLANEOUS) ×3 IMPLANT
NDL SAFETY ECLIPSE 18X1.5 (NEEDLE) ×2 IMPLANT
NEEDLE HYPO 18GX1.5 SHARP (NEEDLE) ×1
NEEDLE HYPO 22GX1.5 SAFETY (NEEDLE) ×3 IMPLANT
NS IRRIG 500ML POUR BTL (IV SOLUTION) ×3 IMPLANT
PACK BASIN DAY SURGERY FS (CUSTOM PROCEDURE TRAY) ×3 IMPLANT
PENCIL BUTTON HOLSTER BLD 10FT (ELECTRODE) ×6 IMPLANT
SPONGE LAP 18X18 X RAY DECT (DISPOSABLE) ×6 IMPLANT
SUT SILK 3 0 SH CR/8 (SUTURE) ×6 IMPLANT
SUT VIC AB 2-0 SH 18 (SUTURE) ×9 IMPLANT
SYR BULB IRRIGATION 50ML (SYRINGE) ×3 IMPLANT
SYR CONTROL 10ML LL (SYRINGE) ×3 IMPLANT
TAPE CLOTH SURG 4X10 WHT LF (GAUZE/BANDAGES/DRESSINGS) ×3 IMPLANT
TOWEL NATURAL 6PK STERILE (DISPOSABLE) ×6 IMPLANT
TRAY DSU PREP LF (CUSTOM PROCEDURE TRAY) ×3 IMPLANT
TUBE CONNECTING 12X1/4 (SUCTIONS) ×6 IMPLANT
YANKAUER SUCT BULB TIP NO VENT (SUCTIONS) ×3 IMPLANT

## 2011-12-06 NOTE — Op Note (Signed)
12/06/2011  1:17 PM  PATIENT:  Jamie [name]  56 y.o. female  Patient Care Team: Abner Greenspan, MD as PCP - General  PRE-OPERATIVE DIAGNOSIS:  stricture anal canal  POST-OPERATIVE DIAGNOSIS:  stricture anal canal, stricture at ostomy site  PROCEDURE:  Procedure(s): EXAM UNDER ANESTHESIA, anal stricture dilation ILEOSTOMY REVISION  SURGEON:  Surgeon(s): Leighton Ruff, MD  ASSISTANT: none   ANESTHESIA:   MAC  EBL: 22m  Total I/O In: 2040 [P.O.:240; I.V.:1800] Out: 700 [Urine:700]  Delay start of Pharmacological VTE agent (>24hrs) due to surgical blood loss or risk of bleeding:  NA  DRAINS: none   SPECIMEN:  No Specimen  DISPOSITION OF SPECIMEN:  N/A  COUNTS:  YES  PLAN OF CARE: Discharge to home after PACU  PATIENT DISPOSITION:  PACU - hemodynamically stable.  INDICATION: this is a 56year old female who presented to my office with a anal stricture. She had had a previous J-pouch and diverting loop ileostomy after a total colectomy for colitis. She is having increasing drainage per rectum. I am concerned that she has an anal stricture and have recommended dilation.  The risk and benefits of this procedure were 20 the patient prior to the OR consent was signed and placed on chart.  OR FINDINGS: there appeared to be a blockage in her distal ileostomy limb. I was unable to pass a red rubber catheter. Patient also had a anal canal stricture with a high J-pouch anastomosis. There was a large amount of scarring which was palpated to feel smooth.  DESCRIPTION: the patient was identified in the preoperative holding area and taken to the OR where she was laid supine on the operating room table. Anesthesia was smoothly induced.  The patient was placed in lithotomy position. A surgical timeout was performed indicating the correct patient positioning and procedure.  After this was completed I began by performing a digital exam. I was unable to palpate the anastomosis and everything  appeared to be completely blocked. After multiple attempts with my finger I decided to try to approach the J. Pouch from above through the distal limb of the loop ileostomy. I placed a EGD scope into the distal limb but was unable to pass this past the fascia. At this point I was concerned that she had a closed loop obstruction. I broke scrub and went out and talked to the patient's husband. I explained the situation and recommended that we try to revise her colostomy to get rid of this stricture. I explained to him the risk and benefits of this including need for additional operations and restenosis. He appeared to understand these risks and agreed to allow me to proceed with the above plan. A new consent was signed by the husband.  I returned to the operating room and the patient's ostomy site was prepped and draped in the usual sterile fashion. An incision was made in the mucocutaneous junction using Bovie electrocautery. The ostomy was dissected free from the surrounding tissues 360 at down to the level of the fascia. The distal loop was separated from the proximal loop using Bovie electrocautery. The distal loop was mobilized upwards out of the abdomen. There was a small tear in the distal loop area. I resected the distal portion of this using Bovie artery the tear was repaired using 3-0 interrupted silk sutures. I attempted to place a red rubber catheter down the distal limb but was unable to advance this much past the fascia. I decided to abort this plan at this  time. The ostomy was then matured in a standard Brooke fashion for the proximal end and the distal ileum was sutured flush to the skin using interrupted 2-0 Vicryl sutures. After this was completed I turned my attention back to the patient's anal canal. I once again attempted dilation this time using a Kelly clamp. I was able to get the Ashton to pass into the J. Pouch. I then dilated the J. Pouch with a Kelly clamp. At this point I was able to get  my index finger tip into the J. Pouch. I passed a red rubber catheter into the J. Pouch and irrigated with normal saline. The irrigation freely flowed out of the J. Pouch at this time. I was confident that the anal obstruction was resolved. I still feel that she has a stricture or obstruction at her distal loop ostomy. The scar palpated around the J. Pouch felt smooth. Due to difficulty with exposure I was unable to get a biopsy of this.  At this point since her anal stricture had been resolved I elected to complete the case at this time. She was awakened from anesthesia and return to the postanesthesia care unit in stable condition. All counts were correct operating room staff.

## 2011-12-06 NOTE — Interval H&P Note (Signed)
History and Physical Interval Note:  12/06/2011 8:43 AM  Jamie [name]  has presented today for surgery, with the diagnosis of stricture anal canal  The various methods of treatment have been discussed with the patient and family. After consideration of risks, benefits and other options for treatment, the patient has consented to  Procedure(s) (LRB) with comments: EXAM UNDER ANESTHESIA (N/A) - rectal exam under anesthesia, anal dilation, pouchoscopy  Tech and Cart From Endoscopy as a surgical intervention .  The patient's history has been reviewed, patient examined, no change in status, stable for surgery.  I have reviewed the patient's chart and labs.  Questions were answered to the patient's satisfaction.  The risks of the procedure were discussed again.  These include pain, bleeding and recurrent stricture.     Rosario Adie, MD  Colorectal and [facility]

## 2011-12-06 NOTE — H&P (View-Only) (Signed)
Chief Complaint  Patient presents with  . Pre-op Exam    eval rectal discharge  . Rectal Problems    HISTORY: Jamie [name] is a 56 y.o. female who presents to the office with increasing blockage of her j pouch.  She is s/p total proctocolectomy and j pouch creation with mucosectomy at [facility] in 1993.  She never had her loop ileostomy reversed because her quality of life was so much better with her ileostomy.  She states that she occasionally has mucus that passes rectally but over the past few months, she has noticed that she gets pelvic cramping and massive discharge.  She denies any fevers and only pink tinged mucus drainage with no frank bleeding.  She has done well with her ostomy and denies any pouching problems.  Past Medical History  Diagnosis Date  . Arthritis   . Gall stones   . Colitis     with colectomy  . HTN (hypertension)   . Obesity   . Bowel obstruction     repeated (? possible adhesions) neg EGD  . Rosacea   . Hyperglycemia     mild      Past Surgical History  Procedure Date  . Total abdominal hysterectomy 05/2006    for abscessed ovaries  . Colectomy 1993      Current Outpatient Prescriptions  Medication Sig Dispense Refill  . Blood Glucose Monitoring Suppl (GLUCOMETER ELITE CLASSIC) KIT by Does not apply route daily. Check sugar once daily and as needed for DM2 250.0       . Cinnamon ([employer] CINNAMON) 500 MG capsule Take 1,000 mg by mouth daily.        . fluticasone (FLOVENT HFA) 220 MCG/ACT inhaler 2 puffs topically to colostomy site once every 3 days.       Marland Kitchen glucose blood test strip 1 each by Other route daily. Use daily and as needed for DM@@ 250.0       . hyoscyamine (LEVSIN SL) 0.125 MG SL tablet Take 1 tablet (0.125 mg total) by mouth every 4 (four) hours as needed. For abdominal cramps  30 tablet  1  . metoprolol tartrate (LOPRESSOR) 25 MG tablet Take 1 tablet (25 mg total) by mouth daily.  90 tablet  3  . Multiple Vitamin (MULTIVITAMIN) tablet Take 1  tablet by mouth daily.        . naproxen sodium (ANAPROX) 220 MG tablet Take 220 mg by mouth as needed.      Marland Kitchen omeprazole (PRILOSEC) 20 MG capsule Take 20 mg by mouth daily.        Marland Kitchen zolpidem (AMBIEN) 10 MG tablet TAKE 1 TABLET BY MOUTH AT BEDTIME AS NEEDED  30 tablet  0      Allergies  Allergen Reactions  . Lisinopril     REACTION: felt bad/ stomach hurt/ weak and tired      Family History  Problem Relation Age of Onset  . Cancer Father     colon  . Liver cancer Father     resection secondary to mets  . Hyperlipidemia Father   . Hypertension Father   . Allergies Father   . Ulcerative colitis Father   . Hypertension Mother   . Hyperlipidemia Mother   . Cirrhosis Mother     NASH  . Diabetes Mother   . Allergies Brother       History   Social History  . Marital Status: Married    Spouse Name: N/A    Number  of Children: 3  . Years of Education: N/A   Occupational History  . Guardian Ad Litem    Social History Main Topics  . Smoking status: Never Smoker   . Smokeless tobacco: None  . Alcohol Use: No  . Drug Use: No  . Sexually Active: None   Other Topics Concern  . None   Social History Narrative  . None       REVIEW OF SYSTEMS - PERTINENT POSITIVES ONLY: Review of Systems - General ROS: negative for - chills or fever Hematological and Lymphatic ROS: negative for - bleeding problems or blood clots Respiratory ROS: no cough, shortness of breath, or wheezing Cardiovascular ROS: no chest pain or dyspnea on exertion Gastrointestinal ROS: no abdominal pain, change in bowel habits, or black or bloody stools Genito-Urinary ROS: no dysuria, trouble voiding, or hematuria  EXAM: Filed Vitals:   11/21/11 0942  BP: 138/78  Pulse: 76  Temp: 97.8 F (36.6 C)      Gen:  No acute distress.  Well nourished and well groomed.   Neurological: Alert and oriented to person, place, and time. Coordination normal.  Head: Normocephalic and atraumatic.  Eyes:  Conjunctivae are normal. Pupils are equal, round, and reactive to light. No scleral icterus.  Neck: Normal range of motion. Neck supple. No tracheal deviation or thyromegaly present.  No cervical lymphadenopathy. Cardiovascular: Normal rate, regular rhythm, normal heart sounds and intact distal pulses Respiratory: Effort normal.  No respiratory distress. Breath sounds normal.  No wheezes, rales or rhonchi.  GI: Soft. Bowel sounds are normal. The abdomen is soft and nontender.  There is no rebound and no guarding. There are no obvious hernias. Musculoskeletal: Normal range of motion. Extremities are nontender.  Skin: Skin is warm and dry. No rash noted. No diaphoresis. No erythema. No pallor. No clubbing, cyanosis, or edema.   Psychiatric: Normal mood and affect. Behavior is normal. Judgment and thought content normal.   Anorectal Exam: anal stricture, unable to perform DRE     ASSESSMENT AND PLAN:  Jamie [name] is a 56 y.o. female who presents to my office with an anorectal stricture.  This is most likely due to disuse and anal scarring.  She is not interested in reversing her ostomy at this time, so our goal is only to make it easier for her j pouch to empty mucus.  I would like to perform a dilation and pouchoscopy in the OR.  We have discussed the risks and benefits of this and she has agreed to proceed.   Rosario Adie, MD Colon and Rectal Surgery / [facility]      Visit Diagnoses: 1. Stricture of anal canal     Primary Care Physician: Loura Pardon, MD

## 2011-12-06 NOTE — Anesthesia Preprocedure Evaluation (Addendum)
Anesthesia Evaluation  Patient identified by MRN, date of birth, ID band Patient awake    Reviewed: Allergy & Precautions, H&P , NPO status , Patient's Chart, lab work & pertinent test results  Airway Mallampati: II TM Distance: >3 FB Neck ROM: Full    Dental  (+) Teeth Intact,    Pulmonary neg pulmonary ROS, sleep apnea and Continuous Positive Airway Pressure Ventilation ,  breath sounds clear to auscultation  Pulmonary exam normal       Cardiovascular hypertension, Pt. on medications negative cardio ROS  Rhythm:Regular Rate:Normal     Neuro/Psych PSYCHIATRIC DISORDERS negative neurological ROS  negative psych ROS   GI/Hepatic negative GI ROS, Neg liver ROS, PUD, GERD-  Medicated,Hx Colitis   Endo/Other  negative endocrine ROSMorbid obesity  Renal/GU negative Renal ROS  negative genitourinary   Musculoskeletal negative musculoskeletal ROS (+)   Abdominal   Peds  Hematology negative hematology ROS (+)   Anesthesia Other Findings   Reproductive/Obstetrics negative OB ROS                           Anesthesia Physical Anesthesia Plan  ASA: II  Anesthesia Plan: General   Post-op Pain Management:    Induction: Intravenous  Airway Management Planned: LMA  Additional Equipment:   Intra-op Plan:   Post-operative Plan: Extubation in OR  Informed Consent: I have reviewed the patients History and Physical, chart, labs and discussed the procedure including the risks, benefits and alternatives for the proposed anesthesia with the patient or authorized representative who has indicated his/her understanding and acceptance.   Dental advisory given  Plan Discussed with: CRNA  Anesthesia Plan Comments:         Anesthesia Quick Evaluation

## 2011-12-06 NOTE — Transfer of Care (Signed)
Immediate Anesthesia Transfer of Care Note  Patient: Jamie [name]  Procedure(s) Performed: Procedure(s) (LRB): EXAM UNDER ANESTHESIA (N/A) COLOSTOMY REVISION (Right)  Patient Location: PACU  Anesthesia Type: General  Level of Consciousness: awake, sedated, patient cooperative and responds to stimulation  Airway & Oxygen Therapy: Patient Spontanous Breathing and Patient connected to face mask oxygen  Post-op Assessment: Report given to PACU RN, Post -op Vital signs reviewed and stable and Patient moving all extremities  Post vital signs: Reviewed and stable  Complications: No apparent anesthesia complications

## 2011-12-06 NOTE — Anesthesia Procedure Notes (Signed)
Procedure Name: LMA Insertion Date/Time: 12/06/2011 9:00 AM Performed by: Justice Rocher Pre-anesthesia Checklist: Patient identified, Emergency Drugs available, Suction available and Patient being monitored Patient Re-evaluated:Patient Re-evaluated prior to inductionOxygen Delivery Method: Circle System Utilized Preoxygenation: Pre-oxygenation with 100% oxygen Intubation Type: IV induction Ventilation: Mask ventilation without difficulty LMA: LMA inserted LMA Size: 4.0 Number of attempts: 1 Airway Equipment and Method: bite block Placement Confirmation: positive ETCO2 Tube secured with: Tape Dental Injury: Teeth and Oropharynx as per pre-operative assessment

## 2011-12-07 ENCOUNTER — Encounter (HOSPITAL_BASED_OUTPATIENT_CLINIC_OR_DEPARTMENT_OTHER): Payer: Self-pay | Admitting: General Surgery

## 2011-12-07 LAB — POCT I-STAT 4, (NA,K, GLUC, HGB,HCT): Sodium: 143 mEq/L (ref 135–145)

## 2011-12-07 NOTE — Anesthesia Postprocedure Evaluation (Signed)
Anesthesia Post Note  Patient: Jamie [name]  Procedure(s) Performed: Procedure(s) (LRB): EXAM UNDER ANESTHESIA (N/A) COLOSTOMY REVISION (Right)  Anesthesia type: General  Patient location: PACU  Post pain: Pain level controlled  Post assessment: Post-op Vital signs reviewed  Last Vitals:  Filed Vitals:   12/06/11 1243  BP: 152/78  Pulse: 75  Temp: 36.2 C  Resp: 16    Post vital signs: Reviewed  Level of consciousness: sedated  Complications: No apparent anesthesia complications

## 2011-12-08 ENCOUNTER — Telehealth (INDEPENDENT_AMBULATORY_CARE_PROVIDER_SITE_OTHER): Payer: Self-pay

## 2011-12-08 NOTE — Telephone Encounter (Signed)
The patient called concerned after her surgery Wednesday.  She is up 7 lbs and her abdomen is bloated.  She has bloody rectal drainage.  She has no fever.  She is urinating ok and having ostomy output.  She is eating ok but gets full quick.  I called Dr Marcello Moores and she said the abdomen probably has some irritation from surgery and fluid accumulation.  It sounds ok as long as the pt has good output, no vomiting and no fever.  She will be on call this weekend if any further problems.  I notified the pt and told her to call back if she has decreased ostomy output, fever and vomiting.  She can call the main # and the answering svc will page Dr Marcello Moores to her.  The pt feels better and will comply.

## 2011-12-12 ENCOUNTER — Telehealth (INDEPENDENT_AMBULATORY_CARE_PROVIDER_SITE_OTHER): Payer: Self-pay

## 2011-12-12 NOTE — Telephone Encounter (Signed)
I discussed the operation with the patient.  I told her to continue her current ostomy care.  We will evaluate the ostomy at her apt next Tues.  Her bloating is getting better.  He drainage is decreasing

## 2011-12-12 NOTE — Telephone Encounter (Signed)
The pt called concerned about her ostomy.  She wants to know what all was done during surgery.  She has a 1 in semi circle sized area under her stoma with raw skin that is very sore.  She has trouble protecting the skin.  I asked her if she had seen the ostomy nurse Juliann Pulse at [facility] and she has not.  I told her maybe Dr Marcello Moores can speak to her this morning about what she did/saw in surgery.  I said we may need to refer her to Marice Potter the ostomy nurse to evaluate her skin.  I moved her appointment from 11/27 to 11/26 because she needs a morning appointment.  She states her bloating is much better from last week.

## 2011-12-14 ENCOUNTER — Telehealth (INDEPENDENT_AMBULATORY_CARE_PROVIDER_SITE_OTHER): Payer: Self-pay | Admitting: General Surgery

## 2011-12-14 NOTE — Telephone Encounter (Signed)
Pt called because she could not remember who she was to contact for ostomy teaching.  Per notes in chart, she was referred to Marice Potter at [facility].  Gave her that phone number as well.

## 2011-12-19 ENCOUNTER — Ambulatory Visit (INDEPENDENT_AMBULATORY_CARE_PROVIDER_SITE_OTHER): Payer: BC Managed Care – PPO | Admitting: General Surgery

## 2011-12-19 ENCOUNTER — Encounter (INDEPENDENT_AMBULATORY_CARE_PROVIDER_SITE_OTHER): Payer: Self-pay | Admitting: General Surgery

## 2011-12-19 VITALS — BP 134/72 | HR 79 | Temp 97.7°F | Ht 66.0 in | Wt 243.0 lb

## 2011-12-19 DIAGNOSIS — K624 Stenosis of anus and rectum: Secondary | ICD-10-CM

## 2011-12-19 NOTE — Progress Notes (Signed)
Jamie [name] is a 56 y.o. female who is here for a follow up visit regarding her recent surgery.  She is having some itching, burning and discomfort around her ostomy.  This is slowly getting better.  She is having clear drainage from her rectum.  Objective: Filed Vitals:   12/19/11 0929  BP: 134/72  Pulse: 79  Temp: 97.7 F (36.5 C)    General appearance: alert and cooperative Abd: soft Mucocutaneous separation of ostomy, mild skin irritation  Assessment and Plan: Pt with ~30% separation of j pouch from anal canal on EUA.  No distal ostomy connection could be established at operation.  Was able to dilate J pouch opening manually.  I discussed possible options if the pouch were to stricture again.  Currently we will work on getting her ostomy better.  I will see her back in about 3 weeks.    Rosario Adie, [facility] [place]

## 2011-12-19 NOTE — Patient Instructions (Signed)
I will see you back in 3 weeks.  Continue current care of your ostomy.

## 2011-12-20 ENCOUNTER — Encounter (INDEPENDENT_AMBULATORY_CARE_PROVIDER_SITE_OTHER): Payer: BC Managed Care – PPO | Admitting: General Surgery

## 2012-01-03 ENCOUNTER — Other Ambulatory Visit: Payer: Self-pay | Admitting: Family Medicine

## 2012-01-03 NOTE — Telephone Encounter (Signed)
Px written for call in   

## 2012-01-03 NOTE — Telephone Encounter (Signed)
Ok to refill 

## 2012-01-03 NOTE — Telephone Encounter (Signed)
Rx called in as prescribed 

## 2012-01-16 ENCOUNTER — Encounter (INDEPENDENT_AMBULATORY_CARE_PROVIDER_SITE_OTHER): Payer: BC Managed Care – PPO | Admitting: General Surgery

## 2012-01-18 ENCOUNTER — Ambulatory Visit: Payer: Self-pay | Admitting: Family Medicine

## 2012-01-25 ENCOUNTER — Encounter: Payer: Self-pay | Admitting: *Deleted

## 2012-02-05 ENCOUNTER — Encounter: Payer: Self-pay | Admitting: Family Medicine

## 2012-02-07 ENCOUNTER — Telehealth: Payer: Self-pay | Admitting: Family Medicine

## 2012-02-08 ENCOUNTER — Telehealth: Payer: Self-pay | Admitting: Family Medicine

## 2012-02-08 NOTE — Telephone Encounter (Signed)
[facility] Triage Call Report Triage Record Num: [id number] Operator: Wynonia Lawman Patient Name: Jamie [name] Call Date & Time: 02/07/2012 5:10:21PM Patient Phone: [phone] PCP: Wynelle Fanny. [place] Patient Gender: Female PCP Fax : Patient DOB: [birth year] Practice Name: [facility] Reason for Call: Caller: [name]/Patient; PCP: Loura Pardon [facility]); CB#: [phone]; Call regarding Cough/Congestion; Has had a cold that has gone into a cough that is not going away. Onset about 01/07/12. Cough is productive but can't get it up. Has been taking Mucinex D; Coughs all day, but is able to sleep at night. Uses Delsym at night. Triaged using Cough with a disposition to be seen within 72 hours due to cough lasting 14 day or more. Care advice given with caller demonstrating understanding. Attempted to schedule appointment with patient 's provider but she is off tomorrow. Patient unable to take first available with another due to a morning committment. Patient will call office to schedule appointment tomorrow at a later date. Protocol(s) Used: Cough - Adult Recommended Outcome per Protocol: See Provider within 72 Hours Reason for Outcome: Cough or hoarseness lasting 14 days or more Care Advice: Suck on hard candy: Life Savers, lemon drops, etc. (sugar free if diabetic) or herbal throat lozenges may provide relief. ~ Limit or avoid exposure to irritants and allergens (e.g. air pollution, smoke/smoking, chemicals, dust, pollen, pet dander, etc.) ~ ~ Avoid any activity that produces symptoms until evaluated by provider. Increase fluids to 8-12 eight oz (1.6 to 2.4 liters) glasses per day, half of them to be water. Soups, popsicles, fruit juices, non-caffeinated sodas (unless restricting sodium intake), jello, broths, decaf teas, etc. are all okay. Warm fluids can be soothing. ~ 02/07/2012 5:27:34PM Page 1 of 1 [id number]

## 2012-02-09 ENCOUNTER — Encounter: Payer: Self-pay | Admitting: Family Medicine

## 2012-02-09 ENCOUNTER — Ambulatory Visit (INDEPENDENT_AMBULATORY_CARE_PROVIDER_SITE_OTHER): Payer: BC Managed Care – PPO | Admitting: Family Medicine

## 2012-02-09 VITALS — BP 138/76 | HR 81 | Temp 98.0°F | Ht 66.0 in | Wt 242.0 lb

## 2012-02-09 DIAGNOSIS — B9689 Other specified bacterial agents as the cause of diseases classified elsewhere: Secondary | ICD-10-CM | POA: Insufficient documentation

## 2012-02-09 DIAGNOSIS — J019 Acute sinusitis, unspecified: Secondary | ICD-10-CM

## 2012-02-09 MED ORDER — AMOXICILLIN-POT CLAVULANATE 875-125 MG PO TABS: 1.0000 | ORAL_TABLET | Freq: Two times a day (BID) | ORAL | Status: DC

## 2012-02-09 MED ORDER — GUAIFENESIN-CODEINE 100-10 MG/5ML PO SYRP: 5.0000 mL | ORAL_SOLUTION | Freq: Four times a day (QID) | ORAL | Status: DC | PRN

## 2012-02-09 NOTE — Assessment & Plan Note (Signed)
After 1 mo of uri symptoms with facial pain / tenderness on L and persistent cough Px augmentin and robitussin ac Disc symptomatic care - see instructions on AVS  Update if not starting to improve in a week or if worsening

## 2012-02-09 NOTE — Patient Instructions (Addendum)
Take augmentin as directed for sinus infection  Drink lots of fluids  Nasal saline spray is helpful  Try robitussin ac for cough - watch out for sedation  Update if not starting to improve in a week or if worsening

## 2012-02-09 NOTE — Progress Notes (Signed)
Subjective:    Patient ID: Jamie [name], female    DOB: 12-26-1955, 57 y.o.   MRN: [id number]  HPI Her with uri symptoms  Is at a month and not getting better Coughing - is prod at times - ? Color mucous  Got better and then worse -- sinus pain now in face - nasal drainage is clear most of the time but very thick No fever  Ears and throat are ok -- early on had a ST  Is taking mucinex D and delsym DM and tussin   Patient Active Problem List  Diagnosis  . OBESITY  . HYPERTENSION  . COLITIS, ULCERATIVE NOS  . OVARIAN MASS  . ROSACEA  . KNEE PAIN  . OSA (obstructive sleep apnea)  . HOARSENESS  . HYPERGLYCEMIA  . ABDOMINAL PAIN, LEFT LOWER QUADRANT, HX OF  . Arthritis  . Gall stones  . Bowel obstruction  . Hyperglycemia  . Stress reaction, emotional  . Compulsive overeater  . [place]  . Left sided abdominal pain  . Other screening mammogram  . Apnea  . Neck pain  . Nevus of lower leg  . Routine general medical examination at a health care facility  . Rectal discharge  . Stricture of anal canal   Past Medical History  Diagnosis Date  . Gall stones 2008  . Colitis     with colectomy  . HTN (hypertension)   . Obesity   . Bowel obstruction     repeated (? possible adhesions) neg EGD  . Rosacea   . Hyperglycemia     mild-monitors A1C  . Sleep apnea     CPAP everynight  . Arthritis     generalized   Past Surgical History  Procedure Date  . Total abdominal hysterectomy 05/2006    for abscessed ovaries  . Colectomy 1993    has ileostomy  . Examination under anesthesia 12/06/2011    Procedure: EXAM UNDER ANESTHESIA;  Surgeon: Leighton Ruff, MD;  Location: [facility];  Service: General;  Laterality: N/A;  rectal exam under anesthesia, anal dilation, pouchoscopy    . Colostomy revision 12/06/2011    Procedure: COLOSTOMY REVISION;  Surgeon: Leighton Ruff, MD;  Location: [facility];  Service: General;  Laterality: Right;  OSTOMY  revision   History  Substance Use Topics  . Smoking status: Never Smoker   . Smokeless tobacco: Not on file  . Alcohol Use: No   Family History  Problem Relation Age of Onset  . Cancer Father     colon  . Liver cancer Father     resection secondary to mets  . Hyperlipidemia Father   . Hypertension Father   . Allergies Father   . Ulcerative colitis Father   . Hypertension Mother   . Hyperlipidemia Mother   . Cirrhosis Mother     NASH  . Diabetes Mother   . Allergies Brother    Allergies  Allergen Reactions  . Lisinopril     REACTION: felt bad/ stomach hurt/ weak and tired   Current Outpatient Prescriptions on File Prior to Visit  Medication Sig Dispense Refill  . Blood Glucose Monitoring Suppl (GLUCOMETER ELITE CLASSIC) KIT by Does not apply route daily. Check sugar once daily and as needed for DM2 250.0       . Cinnamon ([employer] CINNAMON) 500 MG capsule Take 1,000 mg by mouth daily.        Marland Kitchen FLOVENT HFA 220 MCG/ACT inhaler APPLY 2 PUFFS TOPICALLY TO  CHOLOSTOMY SITE EVERY DAY AS DIRECTED  12 g  5  . glucose blood test strip 1 each by Other route daily. Use daily and as needed for DM@@ 250.0       . hyoscyamine (LEVSIN SL) 0.125 MG SL tablet Take 1 tablet (0.125 mg total) by mouth every 4 (four) hours as needed. For abdominal cramps  30 tablet  1  . ibuprofen (ADVIL,MOTRIN) 200 MG tablet Take 200 mg by mouth every 6 (six) hours as needed.      . metoprolol tartrate (LOPRESSOR) 25 MG tablet Take 1 tablet (25 mg total) by mouth daily.  90 tablet  3  . Multiple Vitamin (MULTIVITAMIN) tablet Take 1 tablet by mouth daily.        . naproxen sodium (ANAPROX) 220 MG tablet Take 220 mg by mouth as needed.      Marland Kitchen omeprazole (PRILOSEC) 20 MG capsule Take 20 mg by mouth daily.        Marland Kitchen oxyCODONE-acetaminophen (ROXICET) 5-325 MG per tablet Take 1 tablet by mouth every 4 (four) hours as needed for pain.  60 tablet  0  . zolpidem (AMBIEN) 10 MG tablet TAKE 1 TABLET BY MOUTH AT BEDTIME AS NEEDED   30 tablet  3      Review of Systems    Review of Systems  Constitutional: Negative for fever, appetite change, and unexpected weight change.  ENT pos for cong and sinus pain  Eyes: Negative for pain and visual disturbance.  Respiratory: Negative for wheeze  and shortness of breath.   Cardiovascular: Negative for cp or palpitations    Gastrointestinal: Negative for nausea, diarrhea and constipation.  Genitourinary: Negative for urgency and frequency.  Skin: Negative for pallor or rash   Neurological: Negative for weakness, light-headedness, numbness and headaches.  Hematological: Negative for adenopathy. Does not bruise/bleed easily.  Psychiatric/Behavioral: Negative for dysphoric mood. The patient is not nervous/anxious.      Objective:   Physical Exam  Constitutional: She appears well-developed and well-nourished. No distress.  HENT:  Head: Normocephalic and atraumatic.  Right Ear: External ear normal.  Left Ear: External ear normal.  Mouth/Throat: Oropharynx is clear and moist. No oropharyngeal exudate.       Nares are injected and congested  bilat maxillary and frontal sinus tenderness  Eyes: Conjunctivae normal and EOM are normal. Pupils are equal, round, and reactive to light. Right eye exhibits no discharge. Left eye exhibits no discharge.  Neck: Normal range of motion. Neck supple.  Cardiovascular: Normal rate, regular rhythm, normal heart sounds and intact distal pulses.   Pulmonary/Chest: Effort normal and breath sounds normal. No respiratory distress. She has no wheezes. She has no rales.  Lymphadenopathy:    She has no cervical adenopathy.  Neurological: She is alert. No cranial nerve deficit.  Skin: Skin is warm and dry. No rash noted.  Psychiatric: She has a normal mood and affect.          Assessment & Plan:

## 2012-04-22 ENCOUNTER — Telehealth: Payer: Self-pay

## 2012-04-22 DIAGNOSIS — R198 Other specified symptoms and signs involving the digestive system and abdomen: Secondary | ICD-10-CM

## 2012-04-22 DIAGNOSIS — K5190 Ulcerative colitis, unspecified, without complications: Secondary | ICD-10-CM

## 2012-04-22 DIAGNOSIS — K624 Stenosis of anus and rectum: Secondary | ICD-10-CM

## 2012-04-22 NOTE — Telephone Encounter (Signed)
Pt notified referral made and Rosaria Ferries will call to set appt up

## 2012-04-22 NOTE — Telephone Encounter (Signed)
I will refer - let her know Rosaria Ferries will call her

## 2012-04-22 NOTE — Telephone Encounter (Signed)
Pt left v/m that she has previously discussed with Dr Glori Bickers about getting a GI referral or colorectal surgeon referral as f/u. Pt request referral made to a doctor at [facility].Please advise.

## 2012-05-10 ENCOUNTER — Telehealth: Payer: Self-pay | Admitting: Family Medicine

## 2012-05-10 NOTE — Telephone Encounter (Signed)
Caller: Maddeline/Patient; Phone: [phone]; Reason for Call: Patient states Dr.  Glori Bickers referred her to [facility] physician for stomach ileostomy.  Stoma issues and rectal discharge.  The physician made recommendations on lab work- "mildly abnormal Urinalysis and Mildely elevated inflammatory parameter".  Patient is wanting Dr.  Glori Bickers to be aware and follow up.  She states she was dehydrated when she went to the appt.  Patient is trying to follow up with Dr.  Glori Bickers and does she need to follow up with her?  Please contact [phone].

## 2012-05-13 ENCOUNTER — Encounter: Payer: Self-pay | Admitting: Family Medicine

## 2012-05-13 NOTE — Telephone Encounter (Signed)
Follow up when able - will re check urine and discuss results  thanks

## 2012-05-14 NOTE — Telephone Encounter (Signed)
F/u appt scheduled for tomorrow

## 2012-05-15 ENCOUNTER — Encounter: Payer: Self-pay | Admitting: Family Medicine

## 2012-05-15 ENCOUNTER — Ambulatory Visit (INDEPENDENT_AMBULATORY_CARE_PROVIDER_SITE_OTHER): Payer: BC Managed Care – PPO | Admitting: Family Medicine

## 2012-05-15 VITALS — BP 134/84 | HR 79 | Temp 98.4°F | Ht 66.0 in | Wt 249.2 lb

## 2012-05-15 DIAGNOSIS — R8290 Unspecified abnormal findings in urine: Secondary | ICD-10-CM

## 2012-05-15 DIAGNOSIS — R82998 Other abnormal findings in urine: Secondary | ICD-10-CM

## 2012-05-15 DIAGNOSIS — R7982 Elevated C-reactive protein (CRP): Secondary | ICD-10-CM | POA: Insufficient documentation

## 2012-05-15 LAB — POCT URINALYSIS DIPSTICK
Bilirubin, UA: NEGATIVE
Blood, UA: NEGATIVE
Glucose, UA: NEGATIVE
Nitrite, UA: NEGATIVE
Spec Grav, UA: 1.01
Urobilinogen, UA: 0.2

## 2012-05-15 NOTE — Progress Notes (Signed)
Subjective:    Patient ID: Jamie [name], female    DOB: 01/20/1956, 57 y.o.   MRN: [id number]  HPI Here for f/u of some labs at [facility]  Is feeling pretty well  Was seen at [facility] to review her GI issues - (with rectal pouch)- and then saw rectal surgeon and ostomy nurse Ostomy pain has calmed down a lot (changed some products) Surgeon did not see any reason to remove internal bad She is imp from original procedure   ua was abn with some leukocytes and rbc She was dehydrated  Here UA micro is totally clear  No urine symptoms at all   CRP  was elevated at 1.10  No cp or sob -feels fine She does have arthritis  Hx of colitis   Lab Results  Component Value Date   CHOL 178 09/26/2011   HDL 48.60 09/26/2011   LDLCALC 90 09/26/2011   LDLDIRECT 110.3 04/25/2010   TRIG 195.0* 09/26/2011   CHOLHDL 4 09/26/2011    Patient Active Problem List  Diagnosis  . OBESITY  . HYPERTENSION  . COLITIS, ULCERATIVE NOS  . OVARIAN MASS  . ROSACEA  . KNEE PAIN  . OSA (obstructive sleep apnea)  . HOARSENESS  . HYPERGLYCEMIA  . ABDOMINAL PAIN, LEFT LOWER QUADRANT, HX OF  . Arthritis  . Gall stones  . Bowel obstruction  . Hyperglycemia  . Stress reaction, emotional  . Compulsive overeater  . Comedone  . Left sided abdominal pain  . Other screening mammogram  . Apnea  . Neck pain  . Nevus of lower leg  . Routine general medical examination at a health care facility  . Rectal discharge  . Stricture of anal canal  . Acute bacterial sinusitis  . Abnormal urine   Past Medical History  Diagnosis Date  . Gall stones 2008  . Colitis     with colectomy  . HTN (hypertension)   . Obesity   . Bowel obstruction     repeated (? possible adhesions) neg EGD  . Rosacea   . Hyperglycemia     mild-monitors A1C  . Sleep apnea     CPAP everynight  . Arthritis     generalized   Past Surgical History  Procedure Laterality Date  . Total abdominal hysterectomy  05/2006    for abscessed ovaries  .  Colectomy  1993    has ileostomy  . Examination under anesthesia  12/06/2011    Procedure: EXAM UNDER ANESTHESIA;  Surgeon: Leighton Ruff, MD;  Location: [facility];  Service: General;  Laterality: N/A;  rectal exam under anesthesia, anal dilation, pouchoscopy    . Colostomy revision  12/06/2011    Procedure: COLOSTOMY REVISION;  Surgeon: Leighton Ruff, MD;  Location: [facility];  Service: General;  Laterality: Right;  OSTOMY revision   History  Substance Use Topics  . Smoking status: Never Smoker   . Smokeless tobacco: Not on file  . Alcohol Use: No   Family History  Problem Relation Age of Onset  . Cancer Father     colon  . Liver cancer Father     resection secondary to mets  . Hyperlipidemia Father   . Hypertension Father   . Allergies Father   . Ulcerative colitis Father   . Hypertension Mother   . Hyperlipidemia Mother   . Cirrhosis Mother     NASH  . Diabetes Mother   . Allergies Brother    Allergies  Allergen Reactions  .  Lisinopril     REACTION: felt bad/ stomach hurt/ weak and tired   Current Outpatient Prescriptions on File Prior to Visit  Medication Sig Dispense Refill  . Blood Glucose Monitoring Suppl (GLUCOMETER ELITE CLASSIC) KIT by Does not apply route daily. Check sugar once daily and as needed for DM2 250.0       . Cinnamon ([facility] CINNAMON) 500 MG capsule Take 1,000 mg by mouth daily.        Marland Kitchen glucose blood test strip 1 each by Other route daily. Use daily and as needed for DM@@ 250.0       . hyoscyamine (LEVSIN SL) 0.125 MG SL tablet Take 1 tablet (0.125 mg total) by mouth every 4 (four) hours as needed. For abdominal cramps  30 tablet  1  . ibuprofen (ADVIL,MOTRIN) 200 MG tablet Take 200 mg by mouth every 6 (six) hours as needed.      . metoprolol tartrate (LOPRESSOR) 25 MG tablet Take 1 tablet (25 mg total) by mouth daily.  90 tablet  3  . Multiple Vitamin (MULTIVITAMIN) tablet Take 1 tablet by mouth daily.        .  naproxen sodium (ANAPROX) 220 MG tablet Take 220 mg by mouth as needed.      Marland Kitchen omeprazole (PRILOSEC) 20 MG capsule Take 20 mg by mouth daily.        Marland Kitchen zolpidem (AMBIEN) 10 MG tablet TAKE 1 TABLET BY MOUTH AT BEDTIME AS NEEDED  30 tablet  3   No current facility-administered medications on file prior to visit.    Review of Systems Review of Systems  Constitutional: Negative for fever, appetite change, fatigue and unexpected weight change.  Eyes: Negative for pain and visual disturbance.  Respiratory: Negative for cough and shortness of breath.   Cardiovascular: Negative for cp or palpitations    Gastrointestinal: Negative for nausea, diarrhea and constipation.  Genitourinary: Negative for urgency and frequency. neg for dysuria or hematuria Skin: Negative for pallor or rash    Neurological: Negative for weakness, light-headedness, numbness and headaches.  Hematological: Negative for adenopathy. Does not bruise/bleed easily.  Psychiatric/Behavioral: Negative for dysphoric mood. The patient is not nervous/anxious.         Objective:   Physical Exam  Constitutional: She appears well-developed and well-nourished. No distress.  obese and well appearing   HENT:  Head: Normocephalic and atraumatic.  Mouth/Throat: Oropharynx is clear and moist.  Eyes: Conjunctivae and EOM are normal. Pupils are equal, round, and reactive to light. No scleral icterus.  Neck: Normal range of motion. Neck supple. No JVD present. Carotid bruit is not present. No thyromegaly present.  Cardiovascular: Normal rate, regular rhythm, normal heart sounds and intact distal pulses.  Exam reveals no gallop.   Pulmonary/Chest: Effort normal and breath sounds normal. No respiratory distress. She has no wheezes.  Abdominal: Soft. Bowel sounds are normal. She exhibits no distension, no abdominal bruit and no mass. There is no tenderness.  No suprapubic tenderness or fullness    Ostomy looks to be intact  Musculoskeletal: She  exhibits no edema.  No cva tenderness  Lymphadenopathy:    She has no cervical adenopathy.  Neurological: She is alert. She has normal reflexes. No cranial nerve deficit. She exhibits normal muscle tone. Coordination normal.  Skin: Skin is warm and dry. No rash noted. No pallor.  Psychiatric: She has a normal mood and affect.          Assessment & Plan:

## 2012-05-15 NOTE — Patient Instructions (Addendum)
Urine is clear - drink lots of water and let me know if any urinary symptoms  CRP- is mildly elevated- this is vague and unless other cardiac risk factors or problems develop - not very concerning at this time  Work on healthy diet and exercise  Schedule PE in the fall with labs prior

## 2012-05-15 NOTE — Assessment & Plan Note (Signed)
Mild-and no cardiac symptoms (rev risk factors) She does have inflammatory conditions- arthritis/ colitis/ problems with stoma- that could cause this  Will follow over time

## 2012-05-15 NOTE — Assessment & Plan Note (Signed)
Suspect wbc and rbc were in part due to dehydration at [facility] appt Better today-normal micro Will watch for symptoms at any time

## 2012-05-16 LAB — POCT UA - MICROSCOPIC ONLY
Bacteria, U Microscopic: 0
RBC, urine, microscopic: 0

## 2012-08-27 ENCOUNTER — Other Ambulatory Visit: Payer: Self-pay

## 2012-08-27 MED ORDER — ZOLPIDEM TARTRATE 10 MG PO TABS: ORAL_TABLET | ORAL | Status: DC

## 2012-08-27 NOTE — Telephone Encounter (Signed)
Val with [facility] left v/m requesting refill zolpidem.Please advise.

## 2012-08-27 NOTE — Telephone Encounter (Signed)
Px written for call in   

## 2012-08-27 NOTE — Telephone Encounter (Signed)
Rx called in as prescribed 

## 2012-08-29 NOTE — Telephone Encounter (Addendum)
Pt said needs PA for zolpidem, advised would call [facility] to get PA request and PA procedure reviewed with pt;Megan with [facility] sending PA for Zolpidem now.

## 2012-08-30 NOTE — Telephone Encounter (Signed)
PA form faxed

## 2012-08-30 NOTE — Telephone Encounter (Signed)
PA fax forms have been received. I will place in Dr Marliss Coots inbox for signing.

## 2012-09-05 NOTE — Telephone Encounter (Signed)
Approval letter received and sent to Dr Glori Bickers for signature and scanning; [facility] got approval also and pt has already picked up med.

## 2012-09-19 ENCOUNTER — Ambulatory Visit: Payer: BC Managed Care – PPO | Admitting: Pulmonary Disease

## 2012-09-24 ENCOUNTER — Encounter: Payer: Self-pay | Admitting: Radiology

## 2012-09-24 ENCOUNTER — Telehealth: Payer: Self-pay | Admitting: Family Medicine

## 2012-09-24 DIAGNOSIS — R7309 Other abnormal glucose: Secondary | ICD-10-CM

## 2012-09-24 DIAGNOSIS — Z0000 Encounter for general adult medical examination without abnormal findings: Secondary | ICD-10-CM

## 2012-09-24 NOTE — Telephone Encounter (Signed)
Message copied by Abner Greenspan on Tue Sep 24, 2012 10:01 PM ------      Message from: Ellamae Sia      Created: Tue Sep 17, 2012  3:27 PM      Regarding: Lab orders for Wednesday, 9.3.14       Patient is scheduled for CPX labs, please order future labs, Thanks , Terri       ------

## 2012-09-25 ENCOUNTER — Other Ambulatory Visit (INDEPENDENT_AMBULATORY_CARE_PROVIDER_SITE_OTHER): Payer: BC Managed Care – PPO

## 2012-09-25 DIAGNOSIS — I10 Essential (primary) hypertension: Secondary | ICD-10-CM

## 2012-09-25 DIAGNOSIS — R739 Hyperglycemia, unspecified: Secondary | ICD-10-CM

## 2012-09-25 DIAGNOSIS — R7309 Other abnormal glucose: Secondary | ICD-10-CM

## 2012-09-25 DIAGNOSIS — E669 Obesity, unspecified: Secondary | ICD-10-CM

## 2012-09-25 DIAGNOSIS — Z0000 Encounter for general adult medical examination without abnormal findings: Secondary | ICD-10-CM

## 2012-09-25 LAB — COMPREHENSIVE METABOLIC PANEL
Albumin: 3.1 g/dL — ABNORMAL LOW (ref 3.5–5.2)
BUN: 7 mg/dL (ref 6–23)
CO2: 28 mEq/L (ref 19–32)
Calcium: 9 mg/dL (ref 8.4–10.5)
Chloride: 105 mEq/L (ref 96–112)
Creatinine, Ser: 0.9 mg/dL (ref 0.4–1.2)
GFR: 67.41 mL/min (ref >60.00)
Glucose, Bld: 94 mg/dL (ref 70–99)
Potassium: 4.2 mEq/L (ref 3.5–5.1)

## 2012-09-25 LAB — CBC WITH DIFFERENTIAL/PLATELET
Basophils Relative: 0.3 % (ref 0.0–3.0)
Eosinophils Relative: 5.2 % — ABNORMAL HIGH (ref 0.0–5.0)
Hemoglobin: 15.1 g/dL — ABNORMAL HIGH (ref 12.0–15.0)
Lymphocytes Relative: 19.1 % (ref 12.0–46.0)
Neutro Abs: 6.8 10*3/uL (ref 1.4–7.7)
Neutrophils Relative %: 68.8 % (ref 43.0–77.0)
RBC: 4.79 Mil/uL (ref 3.87–5.11)
WBC: 9.9 10*3/uL (ref 4.5–10.5)

## 2012-09-25 LAB — HEMOGLOBIN A1C: Hgb A1c MFr Bld: 6.5 % (ref 4.6–6.5)

## 2012-09-25 LAB — LIPID PANEL
Cholesterol: 140 mg/dL (ref 0–200)
LDL Cholesterol: 65 mg/dL (ref 0–99)

## 2012-10-02 ENCOUNTER — Ambulatory Visit (INDEPENDENT_AMBULATORY_CARE_PROVIDER_SITE_OTHER): Payer: BC Managed Care – PPO | Admitting: Family Medicine

## 2012-10-02 ENCOUNTER — Encounter: Payer: Self-pay | Admitting: Family Medicine

## 2012-10-02 ENCOUNTER — Encounter: Payer: BC Managed Care – PPO | Admitting: Family Medicine

## 2012-10-02 VITALS — BP 112/60 | HR 78 | Temp 98.6°F | Ht 65.75 in | Wt 241.5 lb

## 2012-10-02 DIAGNOSIS — Z23 Encounter for immunization: Secondary | ICD-10-CM

## 2012-10-02 DIAGNOSIS — I10 Essential (primary) hypertension: Secondary | ICD-10-CM

## 2012-10-02 DIAGNOSIS — Z0000 Encounter for general adult medical examination without abnormal findings: Secondary | ICD-10-CM

## 2012-10-02 DIAGNOSIS — E669 Obesity, unspecified: Secondary | ICD-10-CM

## 2012-10-02 MED ORDER — METOPROLOL TARTRATE 25 MG PO TABS: 25.0000 mg | ORAL_TABLET | Freq: Every day | ORAL | Status: DC

## 2012-10-02 NOTE — Patient Instructions (Addendum)
Tetanus (Tdap) and flu shots today  Keep working on healthy diet/ exercise and weight loss  Schedule f/u in 6 months with labs prior

## 2012-10-02 NOTE — Progress Notes (Signed)
Subjective:    Patient ID: Jamie [name], female    DOB: Feb 11, 1955, 57 y.o.   MRN: [id number]  HPI Reviewed health habits including diet and exercise and skin cancer prevention Also reviewed health mt list, fam hx and immunizations    Had her gallbladder removed at the end of august  Ercp and then followed by the ccy  Is sore but healing well  Feeling better now - still has some issues when she eats - getting a feeling for what bothers her - if she overeats it is the worst  No stool changes    Wt is down 8 lb with bmi of 39 Is eating less now  Had lost 12 lb before surgery- then had lots of fluids in the hospital  Is eating smaller portions Eating more lean protein and lots of vegetables Doing some walking and bought a bicycle also   Hx of colitis with colectomy-does not get colon cancer screen Pap 3/08- then had hysterectomy-no gyn or female issues  Td 1/04 this is due - wants to get that today Flu vaccine- wants to get that today  Mammogram 12/13  Self exam -no lumps or changes   Mood-- is very positive no depression   Hyperlipidemia Lab Results  Component Value Date   CHOL 140 09/25/2012   CHOL 178 09/26/2011   CHOL 185 04/25/2010   Lab Results  Component Value Date   HDL 35.20* 09/25/2012   HDL 48.60 09/26/2011   HDL 47.10 04/25/2010   Lab Results  Component Value Date   LDLCALC 65 09/25/2012   LDLCALC 90 09/26/2011   LDLCALC 101* 10/12/2008   Lab Results  Component Value Date   TRIG 198.0* 09/25/2012   TRIG 195.0* 09/26/2011   TRIG 211.0* 04/25/2010   Lab Results  Component Value Date   CHOLHDL 4 09/25/2012   CHOLHDL 4 09/26/2011   CHOLHDL 4 04/25/2010   Lab Results  Component Value Date   LDLDIRECT 110.3 04/25/2010   overall bad chol is better with diet   Dm Lab Results  Component Value Date   HGBA1C 6.5 09/25/2012   this is up from 6.1 Loosing weight and eating better  Stress of ccy and illness may have elevated her A1C Just before surgery- her sugars were coming  down 115-125 fasting , and does not check sugar later in the day  Eye exam - had that in July- was ok   Patient Active Problem List   Diagnosis Date Noted  . Abnormal urine 05/15/2012  . Elevated C-reactive protein (CRP) 05/15/2012  . Stricture of anal canal 11/21/2011  . Rectal discharge 10/02/2011  . Routine general medical examination at a health care facility 09/24/2011  . Apnea 11/14/2010  . Neck pain 11/14/2010  . Nevus of lower leg 11/14/2010  . Other screening mammogram 10/31/2010  . Left sided abdominal pain 09/08/2010  . Comedone 05/03/2010  . Stress reaction, emotional 05/02/2010  . Compulsive overeater 05/02/2010  . Arthritis   . Gall stones   . Bowel obstruction   . KNEE PAIN 10/14/2009  . HOARSENESS 07/21/2009  . Diabetes type 2, controlled 04/23/2009  . ABDOMINAL PAIN, LEFT LOWER QUADRANT, HX OF 09/08/2008  . ROSACEA 09/12/2007  . HYPERTENSION 05/15/2007  . OBESITY 12/13/2006  . OSA (obstructive sleep apnea) 12/13/2006  . OVARIAN MASS 12/12/2006  . COLITIS, ULCERATIVE NOS 07/12/2006   Past Medical History  Diagnosis Date  . Gall stones 2008  . Colitis     with colectomy  .  HTN (hypertension)   . Obesity   . Bowel obstruction     repeated (? possible adhesions) neg EGD  . Rosacea   . Hyperglycemia     mild-monitors A1C  . Sleep apnea     CPAP everynight  . Arthritis     generalized   Past Surgical History  Procedure Laterality Date  . Total abdominal hysterectomy  05/2006    for abscessed ovaries  . Colectomy  1993    has ileostomy  . Examination under anesthesia  12/06/2011    Procedure: EXAM UNDER ANESTHESIA;  Surgeon: Leighton Ruff, MD;  Location: [facility];  Service: General;  Laterality: N/A;  rectal exam under anesthesia, anal dilation, pouchoscopy    . Colostomy revision  12/06/2011    Procedure: COLOSTOMY REVISION;  Surgeon: Leighton Ruff, MD;  Location: [facility];  Service: General;  Laterality: Right;   OSTOMY revision   History  Substance Use Topics  . Smoking status: Never Smoker   . Smokeless tobacco: Not on file  . Alcohol Use: No   Family History  Problem Relation Age of Onset  . Cancer Father     colon  . Liver cancer Father     resection secondary to mets  . Hyperlipidemia Father   . Hypertension Father   . Allergies Father   . Ulcerative colitis Father   . Hypertension Mother   . Hyperlipidemia Mother   . Cirrhosis Mother     NASH  . Diabetes Mother   . Allergies Brother    Allergies  Allergen Reactions  . Lisinopril     REACTION: felt bad/ stomach hurt/ weak and tired  . Ciprofloxacin Rash    Erythema and pruritis around IV site, erythema and rash along vein   Current Outpatient Prescriptions on File Prior to Visit  Medication Sig Dispense Refill  . Blood Glucose Monitoring Suppl (GLUCOMETER ELITE CLASSIC) KIT by Does not apply route daily. Check sugar once daily and as needed for DM2 250.0       . glucose blood test strip 1 each by Other route daily. Use daily and as needed for DM@@ 250.0       . hyoscyamine (LEVSIN SL) 0.125 MG SL tablet Take 1 tablet (0.125 mg total) by mouth every 4 (four) hours as needed. For abdominal cramps  30 tablet  1  . ibuprofen (ADVIL,MOTRIN) 200 MG tablet Take 200 mg by mouth every 6 (six) hours as needed.      . metoprolol tartrate (LOPRESSOR) 25 MG tablet Take 1 tablet (25 mg total) by mouth daily.  90 tablet  3  . Multiple Vitamin (MULTIVITAMIN) tablet Take 1 tablet by mouth daily.        . naproxen sodium (ANAPROX) 220 MG tablet Take 220 mg by mouth as needed.      . NON FORMULARY SLEEPS WITH C-PAP      . omeprazole (PRILOSEC) 20 MG capsule Take 20 mg by mouth daily.        Marland Kitchen zolpidem (AMBIEN) 10 MG tablet TAKE 1 TABLET BY MOUTH AT BEDTIME AS NEEDED  30 tablet  3   No current facility-administered medications on file prior to visit.    Review of Systems    Review of Systems  Constitutional: Negative for fever, appetite  change, fatigue and unexpected weight change.  Eyes: Negative for pain and visual disturbance.  Respiratory: Negative for cough and shortness of breath.   Cardiovascular: Negative for cp or palpitations  Gastrointestinal: Negative for nausea, diarrhea and constipation. pos for continued rectal discharge (has had surgery) Pos for soreness under her incision from recent ccy Genitourinary: Negative for urgency and frequency.  Skin: Negative for pallor or rash   Neurological: Negative for weakness, light-headedness, numbness and headaches.  Hematological: Negative for adenopathy. Does not bruise/bleed easily.  Psychiatric/Behavioral: Negative for dysphoric mood. The patient is not nervous/anxious.       Objective:   Physical Exam  Constitutional: She appears well-developed and well-nourished. No distress.  obese and well appearing   HENT:  Head: Normocephalic and atraumatic.  Right Ear: External ear normal.  Left Ear: External ear normal.  Nose: Nose normal.  Mouth/Throat: Oropharynx is clear and moist.  Eyes: Conjunctivae and EOM are normal. Pupils are equal, round, and reactive to light. Right eye exhibits no discharge. Left eye exhibits no discharge. No scleral icterus.  Neck: Normal range of motion. Neck supple. No JVD present. Carotid bruit is not present. No thyromegaly present.  Cardiovascular: Normal rate, regular rhythm, normal heart sounds and intact distal pulses.  Exam reveals no gallop.   Pulmonary/Chest: Effort normal and breath sounds normal. No respiratory distress. She has no wheezes. She has no rales.  Abdominal: Soft. Bowel sounds are normal. She exhibits no distension, no abdominal bruit and no mass. There is tenderness.  Tender over ccy incisions that are healing well  Genitourinary: No breast swelling, tenderness, discharge or bleeding.  Breast exam: No mass, nodules, thickening, tenderness, bulging, retraction, inflamation, nipple discharge or skin changes noted.   No axillary or clavicular LA.  Chaperoned exam.    Musculoskeletal: She exhibits no edema and no tenderness.  Lymphadenopathy:    She has no cervical adenopathy.  Neurological: She is alert. She has normal reflexes. No cranial nerve deficit. She exhibits normal muscle tone. Coordination normal.  Skin: Skin is warm and dry. No rash noted. No erythema. No pallor.  Bruising on R arm from recent IV  Psychiatric: She has a normal mood and affect.          Assessment & Plan:

## 2012-10-03 NOTE — Assessment & Plan Note (Signed)
bp in fair control at this time  No changes needed  Disc lifstyle change with low sodium diet and exercise  Labs reviewed  

## 2012-10-03 NOTE — Assessment & Plan Note (Signed)
Reviewed health habits including diet and exercise and skin cancer prevention Also reviewed health mt list, fam hx and immunizations   Wellness labs reviewed

## 2012-10-03 NOTE — Assessment & Plan Note (Signed)
Discussed how this problem influences overall health and the risks it imposes  Reviewed plan for weight loss with lower calorie diet (via better food choices and also portion control or program like weight watchers) and exercise building up to or more than 30 minutes 5 days per week including some aerobic activity    

## 2012-10-23 ENCOUNTER — Encounter: Payer: Self-pay | Admitting: Pulmonary Disease

## 2012-10-23 ENCOUNTER — Ambulatory Visit (INDEPENDENT_AMBULATORY_CARE_PROVIDER_SITE_OTHER): Payer: BC Managed Care – PPO | Admitting: Pulmonary Disease

## 2012-10-23 VITALS — BP 134/72 | HR 77 | Temp 98.0°F | Ht 66.0 in | Wt 244.2 lb

## 2012-10-23 DIAGNOSIS — G4733 Obstructive sleep apnea (adult) (pediatric): Secondary | ICD-10-CM

## 2012-10-23 NOTE — Progress Notes (Signed)
  Subjective:    Patient ID: Jamie [name], female    DOB: 09-21-1955, 57 y.o.   MRN: [id number]  HPI The patient comes in today for followup of her obstructive sleep apnea.  She is having difficulties with CPAP tolerance, and will sleep in a recliner in the place of wearing her CPAP.  She has been able to wear about 3-4 hours a night, and is having issues with her mask hurting the bridge of her nose.  She also admits this is simply the inconvenience of the device.  She thinks she is having some breakthrough events, but is not having mask leaks.   Review of Systems  Constitutional: Negative for fever and unexpected weight change.  HENT: Negative for ear pain, nosebleeds, congestion, sore throat, rhinorrhea, sneezing, trouble swallowing, dental problem, postnasal drip and sinus pressure.   Eyes: Negative for redness and itching.  Respiratory: Negative for cough, chest tightness, shortness of breath and wheezing.   Cardiovascular: Negative for palpitations and leg swelling.  Gastrointestinal: Negative for nausea and vomiting.  Genitourinary: Negative for dysuria.  Musculoskeletal: Negative for joint swelling.  Skin: Negative for rash.  Neurological: Negative for headaches.  Hematological: Does not bruise/bleed easily.  Psychiatric/Behavioral: Negative for dysphoric mood. The patient is not nervous/anxious.        Objective:   Physical Exam Overweight female in no acute distress Nose without purulence or discharge noted No skin breakdown or pressure necrosis from the CPAP mask, but she does have a mild area of erythema on the bridge of her nose. Neck without lymphadenopathy or thyromegaly Lower extremities without significant edema, no cyanosis Alert and oriented, moves all 4 extremities       Assessment & Plan:

## 2012-10-23 NOTE — Patient Instructions (Addendum)
Will have your homecare company get a download off your machine to see if we are adequately controlling your sleep apnea. Will have them also show you different full face mask Read over the handout for a dental appliance, and do some research on line.  It may not totally eliminate your sleep apnea, but may reduce it enough that you are not symptomatic. Work on weight loss. Will call you once I get your download. Make a followup apptm in one year.

## 2012-10-23 NOTE — Assessment & Plan Note (Signed)
The patient is trying to her CPAP as much is possible, but has poor tolerance because of the mask.  Because she has some irritation on the bridge of her nose, she needs to look at some different types.  She is also describing possible breakthrough apneas, and therefore will get a download off her device to see if this is the case.  Overall, her tolerance of the device is not very good, and therefore I discussed with her the possibility of a dental appliance.  She understands this will cure her sleep apnea, but may improve it and not that she is no longer symptomatic.  I have also encouraged her to work aggressively on weight loss.

## 2012-11-28 ENCOUNTER — Other Ambulatory Visit: Payer: Self-pay

## 2013-02-05 ENCOUNTER — Ambulatory Visit: Payer: Self-pay | Admitting: Family Medicine

## 2013-02-06 ENCOUNTER — Encounter: Payer: Self-pay | Admitting: Family Medicine

## 2013-03-26 ENCOUNTER — Telehealth: Payer: Self-pay | Admitting: Family Medicine

## 2013-03-26 ENCOUNTER — Other Ambulatory Visit (INDEPENDENT_AMBULATORY_CARE_PROVIDER_SITE_OTHER): Payer: BC Managed Care – PPO

## 2013-03-26 DIAGNOSIS — E119 Type 2 diabetes mellitus without complications: Secondary | ICD-10-CM

## 2013-03-26 DIAGNOSIS — I10 Essential (primary) hypertension: Secondary | ICD-10-CM

## 2013-03-26 LAB — HEMOGLOBIN A1C: Hgb A1c MFr Bld: 7.1 % — ABNORMAL HIGH (ref 4.6–6.5)

## 2013-03-26 LAB — MICROALBUMIN / CREATININE URINE RATIO
CREATININE, U: 178.2 mg/dL
Microalb Creat Ratio: 2.1 mg/g (ref 0.0–30.0)
Microalb, Ur: 3.7 mg/dL — ABNORMAL HIGH (ref 0.0–1.9)

## 2013-03-26 NOTE — Telephone Encounter (Signed)
Relevant patient education assigned to patient using Emmi. ° °

## 2013-03-26 NOTE — Telephone Encounter (Signed)
Message copied by Abner Greenspan on Wed Mar 26, 2013  6:15 AM ------      Message from: Ellamae Sia      Created: Fri Mar 21, 2013 12:40 PM      Regarding: Lab orders for Wednesday, 3.4.15       Lab orders for a 6 month f/u ------

## 2013-03-28 ENCOUNTER — Telehealth: Payer: Self-pay

## 2013-03-28 NOTE — Telephone Encounter (Signed)
Relevant patient education assigned to patient using Emmi. ° °

## 2013-04-02 ENCOUNTER — Encounter: Payer: Self-pay | Admitting: Family Medicine

## 2013-04-02 ENCOUNTER — Ambulatory Visit (INDEPENDENT_AMBULATORY_CARE_PROVIDER_SITE_OTHER): Payer: BC Managed Care – PPO | Admitting: Family Medicine

## 2013-04-02 VITALS — BP 118/76 | HR 86 | Temp 98.1°F | Ht 66.0 in | Wt 237.5 lb

## 2013-04-02 DIAGNOSIS — E119 Type 2 diabetes mellitus without complications: Secondary | ICD-10-CM

## 2013-04-02 DIAGNOSIS — E669 Obesity, unspecified: Secondary | ICD-10-CM

## 2013-04-02 DIAGNOSIS — I10 Essential (primary) hypertension: Secondary | ICD-10-CM

## 2013-04-02 MED ORDER — HYOSCYAMINE SULFATE 0.125 MG SL SUBL: 0.1250 mg | SUBLINGUAL_TABLET | SUBLINGUAL | Status: DC | PRN

## 2013-04-02 NOTE — Patient Instructions (Signed)
Return to a diabetic diet  Work on weight loss Work up to 5 days of exercise per week 30 or more minutes indoors or out  Follow up in 3 months with labs prior

## 2013-04-02 NOTE — Progress Notes (Signed)
Subjective:    Patient ID: Jamie [name], female    DOB: 1955-06-28, 58 y.o.   MRN: [id number]  HPI  Here for f/u of DM and HTN   Lost wt 7 lb -bmi of 73  Lost it due to a stomach bug - still slowly adv diet   bp is stable today  No cp or palpitations or headaches or edema  No side effects to medicines  BP Readings from Last 3 Encounters:  04/02/13 118/76  10/23/12 134/72  10/02/12 112/60      Lab Results  Component Value Date   HGBA1C 7.1* 03/26/2013   that is up from 6.7  She has not been motivated to do what she needs to do lifestyle wise  Went on a cruise  Holidays were bad also   microalb test is pos as well  Pt cannot take ace inhibitors   She really wants to work on healthy diet and exercise and wt loss now  She pulled out her DM ed materials  Is ready to start watching what she does  Plans to walk outdoors  Has a fitbit  For indoors she has a Building control surveyor fit    Patient Active Problem List   Diagnosis Date Noted  . Abnormal urine 05/15/2012  . Elevated C-reactive protein (CRP) 05/15/2012  . Stricture of anal canal 11/21/2011  . Rectal discharge 10/02/2011  . Routine general medical examination at a health care facility 09/24/2011  . Apnea 11/14/2010  . Neck pain 11/14/2010  . Nevus of lower leg 11/14/2010  . Other screening mammogram 10/31/2010  . Left sided abdominal pain 09/08/2010  . Comedone 05/03/2010  . Stress reaction, emotional 05/02/2010  . Compulsive overeater 05/02/2010  . Arthritis   . Gall stones   . Bowel obstruction   . KNEE PAIN 10/14/2009  . HOARSENESS 07/21/2009  . Diabetes type 2, controlled 04/23/2009  . ABDOMINAL PAIN, LEFT LOWER QUADRANT, HX OF 09/08/2008  . ROSACEA 09/12/2007  . HYPERTENSION 05/15/2007  . OBESITY 12/13/2006  . OSA (obstructive sleep apnea) 12/13/2006  . OVARIAN MASS 12/12/2006  . COLITIS, ULCERATIVE NOS 07/12/2006   Past Medical History  Diagnosis Date  . Gall stones 2008  . Colitis     with colectomy  .  HTN (hypertension)   . Obesity   . Bowel obstruction     repeated (? possible adhesions) neg EGD  . Rosacea   . Hyperglycemia     mild-monitors A1C  . Sleep apnea     CPAP everynight  . Arthritis     generalized   Past Surgical History  Procedure Laterality Date  . Total abdominal hysterectomy  05/2006    for abscessed ovaries  . Colectomy  1993    has ileostomy  . Examination under anesthesia  12/06/2011    Procedure: EXAM UNDER ANESTHESIA;  Surgeon: Leighton Ruff, MD;  Location: [facility];  Service: General;  Laterality: N/A;  rectal exam under anesthesia, anal dilation, pouchoscopy    . Colostomy revision  12/06/2011    Procedure: COLOSTOMY REVISION;  Surgeon: Leighton Ruff, MD;  Location: [facility];  Service: General;  Laterality: Right;  OSTOMY revision   History  Substance Use Topics  . Smoking status: Never Smoker   . Smokeless tobacco: Not on file  . Alcohol Use: No   Family History  Problem Relation Age of Onset  . Cancer Father     colon  . Liver cancer Father  resection secondary to mets  . Hyperlipidemia Father   . Hypertension Father   . Allergies Father   . Ulcerative colitis Father   . Hypertension Mother   . Hyperlipidemia Mother   . Cirrhosis Mother     NASH  . Diabetes Mother   . Allergies Brother    Allergies  Allergen Reactions  . Lisinopril     REACTION: felt bad/ stomach hurt/ weak and tired  . Ciprofloxacin Rash    Erythema and pruritis around IV site, erythema and rash along vein   Current Outpatient Prescriptions on File Prior to Visit  Medication Sig Dispense Refill  . Blood Glucose Monitoring Suppl (GLUCOMETER ELITE CLASSIC) KIT by Does not apply route daily. Check sugar once daily and as needed for DM2 250.0       . glucose blood test strip 1 each by Other route daily. Use daily and as needed for DM@@ 250.0       . hyoscyamine (LEVSIN SL) 0.125 MG SL tablet Take 1 tablet (0.125 mg total) by mouth  every 4 (four) hours as needed. For abdominal cramps  30 tablet  1  . ibuprofen (ADVIL,MOTRIN) 200 MG tablet Take 200 mg by mouth every 6 (six) hours as needed.      . metoprolol tartrate (LOPRESSOR) 25 MG tablet Take 1 tablet (25 mg total) by mouth daily.  90 tablet  3  . Multiple Vitamin (MULTIVITAMIN) tablet Take 1 tablet by mouth daily.        . naproxen sodium (ANAPROX) 220 MG tablet Take 220 mg by mouth as needed.      . NON FORMULARY SLEEPS WITH C-PAP      . omeprazole (PRILOSEC) 20 MG capsule Take 20 mg by mouth daily.        Marland Kitchen zolpidem (AMBIEN) 10 MG tablet TAKE 1 TABLET BY MOUTH AT BEDTIME AS NEEDED  30 tablet  3   No current facility-administered medications on file prior to visit.    Review of Systems Review of Systems  Constitutional: Negative for fever, appetite change, fatigue and unexpected weight change.  Eyes: Negative for pain and visual disturbance.  Respiratory: Negative for cough and shortness of breath.   Cardiovascular: Negative for cp or palpitations    Gastrointestinal: Negative for nausea, diarrhea and constipation.  Genitourinary: Negative for urgency and frequency.  Skin: Negative for pallor or rash   Neurological: Negative for weakness, light-headedness, numbness and headaches.  Hematological: Negative for adenopathy. Does not bruise/bleed easily.  Psychiatric/Behavioral: Negative for dysphoric mood. The patient is not nervous/anxious.         Objective:   Physical Exam  Constitutional: She appears well-developed and well-nourished. No distress.  obese and well appearing   HENT:  Head: Normocephalic and atraumatic.  Mouth/Throat: Oropharynx is clear and moist.  Eyes: Conjunctivae and EOM are normal. Pupils are equal, round, and reactive to light. No scleral icterus.  Neck: Normal range of motion. Neck supple. No JVD present. Carotid bruit is not present. No thyromegaly present.  Cardiovascular: Normal rate, regular rhythm, normal heart sounds and  intact distal pulses.  Exam reveals no gallop.   No murmur heard. Pulmonary/Chest: Effort normal and breath sounds normal.  Abdominal: Soft. Normal appearance. She exhibits no abdominal bruit.  Colostomy site is normal appearing   Musculoskeletal: She exhibits no edema.  Lymphadenopathy:    She has no cervical adenopathy.  Neurological: She is alert. She has normal reflexes.  Skin: Skin is warm and dry. No rash  noted. No pallor.  Psychiatric: She has a normal mood and affect.          Assessment & Plan:

## 2013-04-03 NOTE — Assessment & Plan Note (Signed)
bp in fair control at this time  BP Readings from Last 1 Encounters:  04/02/13 118/76   No changes needed Disc lifstyle change with low sodium diet and exercise  Labs reviewed

## 2013-04-03 NOTE — Assessment & Plan Note (Signed)
Lab Results  Component Value Date   HGBA1C 7.1* 03/26/2013    Worse control Long disc re: lifestyle change and obesity Pt declines med  She will work on above  Lab and f/u 3 mo

## 2013-04-03 NOTE — Assessment & Plan Note (Signed)
Discussed how this problem influences overall health and the risks it imposes  Reviewed plan for weight loss with lower calorie diet (via better food choices and also portion control or program like weight watchers) and exercise building up to or more than 30 minutes 5 days per week including some aerobic activity    

## 2013-06-12 ENCOUNTER — Telehealth: Payer: Self-pay | Admitting: Pulmonary Disease

## 2013-06-12 DIAGNOSIS — G4733 Obstructive sleep apnea (adult) (pediatric): Secondary | ICD-10-CM

## 2013-06-12 NOTE — Telephone Encounter (Signed)
Pt is asking for an order for cpap supplies be sent to [facility]. Order placed. Harrells Bing, CMA

## 2013-06-25 ENCOUNTER — Telehealth: Payer: Self-pay | Admitting: Family Medicine

## 2013-06-25 ENCOUNTER — Other Ambulatory Visit (INDEPENDENT_AMBULATORY_CARE_PROVIDER_SITE_OTHER): Payer: BC Managed Care – PPO

## 2013-06-25 DIAGNOSIS — E119 Type 2 diabetes mellitus without complications: Secondary | ICD-10-CM

## 2013-06-25 LAB — HEMOGLOBIN A1C: Hgb A1c MFr Bld: 6.6 % — ABNORMAL HIGH (ref 4.6–6.5)

## 2013-06-25 NOTE — Telephone Encounter (Signed)
Message copied by Abner Greenspan on Wed Jun 25, 2013  6:10 AM ------      Message from: Marchia Bond      Created: Fri Jun 20, 2013 10:00 AM      Regarding: 3 month f/u labs       Please order  future f/u labs for pt's upcomming lab appt.      Thanks      Tasha             ------

## 2013-06-25 NOTE — Telephone Encounter (Signed)
It was for an A1C and I think I ordered it at her last visit- it lists it as being ordered

## 2013-07-02 ENCOUNTER — Ambulatory Visit (INDEPENDENT_AMBULATORY_CARE_PROVIDER_SITE_OTHER): Payer: BC Managed Care – PPO | Admitting: Family Medicine

## 2013-07-02 ENCOUNTER — Encounter: Payer: Self-pay | Admitting: Family Medicine

## 2013-07-02 VITALS — BP 140/78 | HR 77 | Temp 98.4°F | Ht 66.0 in | Wt 246.0 lb

## 2013-07-02 DIAGNOSIS — R894 Abnormal immunological findings in specimens from other organs, systems and tissues: Secondary | ICD-10-CM

## 2013-07-02 DIAGNOSIS — R768 Other specified abnormal immunological findings in serum: Secondary | ICD-10-CM

## 2013-07-02 DIAGNOSIS — E119 Type 2 diabetes mellitus without complications: Secondary | ICD-10-CM

## 2013-07-02 DIAGNOSIS — I10 Essential (primary) hypertension: Secondary | ICD-10-CM

## 2013-07-02 DIAGNOSIS — E669 Obesity, unspecified: Secondary | ICD-10-CM

## 2013-07-02 NOTE — Progress Notes (Signed)
Subjective:    Patient ID: Jamie [name], female    DOB: November 16, 1955, 58 y.o.   MRN: [id number]  HPI Here for f/u of DM and HTN   Working hard on DM Has increased her activity- walking / more exercise for the sake of exercise  Eating is not as good as it has been - but working on it  Watching sugar in diet more carefully - little changes (accountability) Lab Results  Component Value Date   HGBA1C 6.6* 06/25/2013   down form 7.1  opthy due in July   Gained some of  Wt is up 9lb with bmi of 39   bp is up a little today -was outside painting this am - just finished a project -just got tone  No cp or palpitations or headaches or edema  No side effects to medicines  BP Readings from Last 3 Encounters:  07/02/13 140/78  04/02/13 118/76  10/23/12 134/72      Was told red blood cell antibody was pos in the past - [facility] found this when she was type and cross matched for blood in the past  Noted in her chart today  Lab Results  Component Value Date   WBC 9.9 09/25/2012   HGB 15.1* 09/25/2012   HCT 44.0 09/25/2012   MCV 91.9 09/25/2012   PLT 187.0 09/25/2012    Patient Active Problem List   Diagnosis Date Noted  . Abnormal urine 05/15/2012  . Elevated C-reactive protein (CRP) 05/15/2012  . Stricture of anal canal 11/21/2011  . Rectal discharge 10/02/2011  . Routine general medical examination at a health care facility 09/24/2011  . Apnea 11/14/2010  . Neck pain 11/14/2010  . Nevus of lower leg 11/14/2010  . Other screening mammogram 10/31/2010  . Left sided abdominal pain 09/08/2010  . Comedone 05/03/2010  . Stress reaction, emotional 05/02/2010  . Compulsive overeater 05/02/2010  . Arthritis   . Gall stones   . Bowel obstruction   . KNEE PAIN 10/14/2009  . HOARSENESS 07/21/2009  . Diabetes type 2, controlled 04/23/2009  . ABDOMINAL PAIN, LEFT LOWER QUADRANT, HX OF 09/08/2008  . ROSACEA 09/12/2007  . HYPERTENSION 05/15/2007  . OBESITY 12/13/2006  . OSA (obstructive sleep  apnea) 12/13/2006  . OVARIAN MASS 12/12/2006  . COLITIS, ULCERATIVE NOS 07/12/2006   Past Medical History  Diagnosis Date  . Gall stones 2008  . Colitis     with colectomy  . HTN (hypertension)   . Obesity   . Bowel obstruction     repeated (? possible adhesions) neg EGD  . Rosacea   . Hyperglycemia     mild-monitors A1C  . Sleep apnea     CPAP everynight  . Arthritis     generalized   Past Surgical History  Procedure Laterality Date  . Total abdominal hysterectomy  05/2006    for abscessed ovaries  . Colectomy  1993    has ileostomy  . Examination under anesthesia  12/06/2011    Procedure: EXAM UNDER ANESTHESIA;  Surgeon: Leighton Ruff, MD;  Location[facility];  Service: General;  Laterality: N/A;  rectal exam under anesthesia, anal dilation, pouchoscopy    . Colostomy revision  12/06/2011    Procedure: COLOSTOMY REVISION;  Surgeon: Leighton Ruff, MD;  Location: [facility];  Service: General;  Laterality: Right;  OSTOMY revision   History  Substance Use Topics  . Smoking status: Never Smoker   . Smokeless tobacco: Not on file  .  Alcohol Use: No   Family History  Problem Relation Age of Onset  . Cancer Father     colon  . Liver cancer Father     resection secondary to mets  . Hyperlipidemia Father   . Hypertension Father   . Allergies Father   . Ulcerative colitis Father   . Hypertension Mother   . Hyperlipidemia Mother   . Cirrhosis Mother     NASH  . Diabetes Mother   . Allergies Brother    Allergies  Allergen Reactions  . Lisinopril     REACTION: felt bad/ stomach hurt/ weak and tired  . Ciprofloxacin Rash    Erythema and pruritis around IV site, erythema and rash along vein   Current Outpatient Prescriptions on File Prior to Visit  Medication Sig Dispense Refill  . Blood Glucose Monitoring Suppl (GLUCOMETER ELITE CLASSIC) KIT by Does not apply route daily. Check sugar once daily and as needed for DM2 250.0       .  glucose blood test strip 1 each by Other route daily. Use daily and as needed for DM@@ 250.0       . hyoscyamine (LEVSIN SL) 0.125 MG SL tablet Take 1 tablet (0.125 mg total) by mouth every 4 (four) hours as needed. For abdominal cramps  30 tablet  5  . ibuprofen (ADVIL,MOTRIN) 200 MG tablet Take 200 mg by mouth every 6 (six) hours as needed.      . metoprolol tartrate (LOPRESSOR) 25 MG tablet Take 1 tablet (25 mg total) by mouth daily.  90 tablet  3  . Multiple Vitamin (MULTIVITAMIN) tablet Take 1 tablet by mouth daily.        . naproxen sodium (ANAPROX) 220 MG tablet Take 220 mg by mouth as needed.      . NON FORMULARY SLEEPS WITH C-PAP      . omeprazole (PRILOSEC) 20 MG capsule Take 20 mg by mouth daily.        Marland Kitchen zolpidem (AMBIEN) 10 MG tablet TAKE 1 TABLET BY MOUTH AT BEDTIME AS NEEDED  30 tablet  3   No current facility-administered medications on file prior to visit.     Review of Systems Review of Systems  Constitutional: Negative for fever, appetite change, fatigue and unexpected weight change.  Eyes: Negative for pain and visual disturbance.  Respiratory: Negative for cough and shortness of breath.   Cardiovascular: Negative for cp or palpitations    Gastrointestinal: Negative for nausea, diarrhea and constipation.  Genitourinary: Negative for urgency and frequency.  Skin: Negative for pallor or rash   Neurological: Negative for weakness, light-headedness, numbness and headaches.  Hematological: Negative for adenopathy. Does not bruise/bleed easily.  Psychiatric/Behavioral: Negative for dysphoric mood. The patient is not nervous/anxious.         Objective:   Physical Exam  Constitutional: She appears well-developed and well-nourished. No distress.  obese and well appearing   HENT:  Head: Normocephalic and atraumatic.  Mouth/Throat: Oropharynx is clear and moist.  Eyes: Conjunctivae and EOM are normal. Pupils are equal, round, and reactive to light. No scleral icterus.    Neck: Normal range of motion. Neck supple. No JVD present. Carotid bruit is not present. No thyromegaly present.  Cardiovascular: Normal rate, regular rhythm, normal heart sounds and intact distal pulses.  Exam reveals no gallop.   Pulmonary/Chest: Effort normal and breath sounds normal. No respiratory distress. She has no wheezes. She has no rales.  Abdominal: She exhibits no abdominal bruit.  Musculoskeletal: She  exhibits no edema.  Lymphadenopathy:    She has no cervical adenopathy.  Neurological: She is alert. She has normal reflexes. No cranial nerve deficit. She exhibits normal muscle tone. Coordination normal.  Skin: Skin is warm and dry. No pallor.  Psychiatric: She has a normal mood and affect.          Assessment & Plan:   Problem List Items Addressed This Visit     Cardiovascular and Mediastinum   HYPERTENSION - Primary      bp in fair control at this time (up a bit since she just exercised) BP Readings from Last 1 Encounters:  07/02/13 140/78   No changes needed Disc lifstyle change with low sodium diet and exercise   F/u sept       Endocrine   Diabetes type 2, controlled      Lab Results  Component Value Date   HGBA1C 6.6* 06/25/2013    Improved with diet and exercise Will work on wt loss opthy due in July F/u sept       Other   OBESITY     Discussed how this problem influences overall health and the risks it imposes  Reviewed plan for weight loss with lower calorie diet (via better food choices and also portion control or program like weight watchers) and exercise building up to or more than 30 minutes 5 days per week including some aerobic activity   Will keep working on it and red portions    Red blood cell antibody positive

## 2013-07-02 NOTE — Progress Notes (Signed)
Pre visit review using our clinic review tool, if applicable. No additional management support is needed unless otherwise documented below in the visit note. 

## 2013-07-02 NOTE — Assessment & Plan Note (Signed)
Discussed how this problem influences overall health and the risks it imposes  Reviewed plan for weight loss with lower calorie diet (via better food choices and also portion control or program like weight watchers) and exercise building up to or more than 30 minutes 5 days per week including some aerobic activity   Will keep working on it and red portions

## 2013-07-02 NOTE — Assessment & Plan Note (Signed)
bp in fair control at this time (up a bit since she just exercised) BP Readings from Last 1 Encounters:  07/02/13 140/78   No changes needed Disc lifstyle change with low sodium diet and exercise   F/u sept

## 2013-07-02 NOTE — Assessment & Plan Note (Signed)
Lab Results  Component Value Date   HGBA1C 6.6* 06/25/2013    Improved with diet and exercise Will work on wt loss opthy due in July F/u sept

## 2013-07-02 NOTE — Patient Instructions (Signed)
I'm glad you are doing better with diabetes  Keep up the good work with diet and exercise  A1C is better  Follow up for annual exam with labs prior after Sept 10 th  Take care of yourself

## 2013-10-27 ENCOUNTER — Other Ambulatory Visit: Payer: Self-pay | Admitting: Family Medicine

## 2013-10-29 ENCOUNTER — Ambulatory Visit (INDEPENDENT_AMBULATORY_CARE_PROVIDER_SITE_OTHER): Payer: BC Managed Care – PPO | Admitting: Pulmonary Disease

## 2013-10-29 ENCOUNTER — Encounter: Payer: Self-pay | Admitting: Pulmonary Disease

## 2013-10-29 VITALS — BP 108/62 | HR 70 | Temp 98.2°F | Ht 66.0 in | Wt 254.0 lb

## 2013-10-29 DIAGNOSIS — G4733 Obstructive sleep apnea (adult) (pediatric): Secondary | ICD-10-CM

## 2013-10-29 NOTE — Progress Notes (Signed)
   Subjective:    Patient ID: Jamie [name], female    DOB: 1955-05-02, 58 y.o.   MRN: [id number]  HPI The patient comes in today for followup of her obstructive sleep apnea. She continues to have issues with: The mask off at night, and she has no idea why. We were supposed to get a download off her device last year, but never received this. She is unsure if the pressure is too high or too low, whether it is simply a claustrophobia issue.   Review of Systems  Constitutional: Negative for fever and unexpected weight change.  HENT: Negative for congestion, dental problem, ear pain, nosebleeds, postnasal drip, rhinorrhea, sinus pressure, sneezing, sore throat and trouble swallowing.   Eyes: Negative for redness and itching.  Respiratory: Negative for cough, chest tightness, shortness of breath and wheezing.   Cardiovascular: Negative for palpitations and leg swelling.  Gastrointestinal: Negative for nausea and vomiting.  Genitourinary: Negative for dysuria.  Musculoskeletal: Negative for joint swelling.  Skin: Negative for rash.  Neurological: Positive for dizziness. Negative for headaches.  Hematological: Does not bruise/bleed easily.  Psychiatric/Behavioral: Negative for dysphoric mood. The patient is not nervous/anxious.        Objective:   Physical Exam Obese female in no acute distress Nose without purulence or discharge noted Neck without lymphadenopathy or thyromegaly No skin breakdown or pressure necrosis from the CPAP mask Lower extremities with mild edema, no cyanosis Alert and oriented, moves all 4 extremities.       Assessment & Plan:

## 2013-10-29 NOTE — Assessment & Plan Note (Signed)
The patient is wearing CPAP, but continues to struggle with wearing the device all night. I will get another download off her machine to make sure that we are adequately treating her OSA. If her pressure is adequate, I would consider changing her to bilevel because of her poor tolerance of CPAP. I've also encouraged her to work aggressively on weight loss.

## 2013-10-29 NOTE — Patient Instructions (Signed)
Will get another download off your machine to troubleshoot the issues.  If you do not hear from [place] within one week after your download, please call us. Keep working on weight loss followup with me again in one year if doing well.

## 2013-11-19 ENCOUNTER — Telehealth: Payer: Self-pay | Admitting: Pulmonary Disease

## 2013-11-19 DIAGNOSIS — G4733 Obstructive sleep apnea (adult) (pediatric): Secondary | ICD-10-CM

## 2013-11-19 NOTE — Telephone Encounter (Signed)
I do not have her download.  My folders are empty

## 2013-11-19 NOTE — Telephone Encounter (Signed)
Pt calling wanting to know if CPAP D/L ever received. Please advise if this is in your look at to be reviewed. Patient Instructions     Will get another download off your machine to troubleshoot the issues. If you do not hear from [place] within one week after your download, please call us.  Keep working on weight loss  followup with me again in one year if doing well.    Please advise Dr Gwenette Greet. Thanks.

## 2013-11-19 NOTE — Telephone Encounter (Signed)
Order was placed on 10/29/13 for the following:  Need download off cpap for last 3 mos   [id number] on [facility] VM with [facility] (ATC Melissa's # but was auto directed to [place])

## 2013-11-20 NOTE — Telephone Encounter (Signed)
Spoke with Jamie [name] with [facility] - she will fax download to traige.  Will await fax.

## 2013-11-20 NOTE — Telephone Encounter (Signed)
Melissa from [place] called & she is sending fax now to [phone].

## 2013-11-21 ENCOUNTER — Encounter: Payer: Self-pay | Admitting: Pulmonary Disease

## 2013-11-21 NOTE — Telephone Encounter (Signed)
DL received and placed on Mindy's desk to have [place] review

## 2013-11-21 NOTE — Telephone Encounter (Signed)
Spoke with Melissa  I advised that we never received her fax  She will hand deliver to triage today  Will await results

## 2013-11-24 NOTE — Telephone Encounter (Signed)
Spoke with patient-she is aware of results and okay with change in pressure. I have placed order for change. Pt will call us in 4 weeks to give [place] updates.

## 2013-11-24 NOTE — Telephone Encounter (Signed)
Let pt know that her download shows that she is having no significant mask leak, but she is having breakthru apnea at 8 events/hr.  Since she did not tolerate the auto setting well, would like to increase her cpap pressure to 16cm.  Please send an order for this.  Please have her call us in 4 weeks to give update with how things are going.

## 2013-11-27 ENCOUNTER — Telehealth: Payer: Self-pay | Admitting: Family Medicine

## 2013-11-27 DIAGNOSIS — E119 Type 2 diabetes mellitus without complications: Secondary | ICD-10-CM

## 2013-11-27 DIAGNOSIS — Z0000 Encounter for general adult medical examination without abnormal findings: Secondary | ICD-10-CM

## 2013-11-27 NOTE — Telephone Encounter (Signed)
Future labs

## 2013-11-28 ENCOUNTER — Other Ambulatory Visit (INDEPENDENT_AMBULATORY_CARE_PROVIDER_SITE_OTHER): Payer: BC Managed Care – PPO

## 2013-11-28 DIAGNOSIS — E119 Type 2 diabetes mellitus without complications: Secondary | ICD-10-CM

## 2013-11-28 DIAGNOSIS — Z0000 Encounter for general adult medical examination without abnormal findings: Secondary | ICD-10-CM

## 2013-11-28 LAB — CBC WITH DIFFERENTIAL/PLATELET
BASOS ABS: 0 10*3/uL (ref 0.0–0.1)
Basophils Relative: 0.2 % (ref 0.0–3.0)
EOS ABS: 0.5 10*3/uL (ref 0.0–0.7)
Eosinophils Relative: 5.7 % — ABNORMAL HIGH (ref 0.0–5.0)
HCT: 43.1 % (ref 36.0–46.0)
Hemoglobin: 14.2 g/dL (ref 12.0–15.0)
LYMPHS PCT: 22.4 % (ref 12.0–46.0)
Lymphs Abs: 2 10*3/uL (ref 0.7–4.0)
MCHC: 32.9 g/dL (ref 30.0–36.0)
MCV: 92.6 fl (ref 78.0–100.0)
Monocytes Absolute: 0.5 10*3/uL (ref 0.1–1.0)
Monocytes Relative: 5.5 % (ref 3.0–12.0)
NEUTROS PCT: 66.2 % (ref 43.0–77.0)
Neutro Abs: 6.1 10*3/uL (ref 1.4–7.7)
PLATELETS: 285 10*3/uL (ref 150.0–400.0)
RBC: 4.66 Mil/uL (ref 3.87–5.11)
RDW: 13.1 % (ref 11.5–15.5)
WBC: 9.1 10*3/uL (ref 4.0–10.5)

## 2013-11-28 LAB — COMPREHENSIVE METABOLIC PANEL
ALBUMIN: 3.2 g/dL — AB (ref 3.5–5.2)
ALT: 43 U/L — AB (ref 0–35)
AST: 54 U/L — AB (ref 0–37)
Alkaline Phosphatase: 73 U/L (ref 39–117)
BUN: 12 mg/dL (ref 6–23)
CALCIUM: 9.3 mg/dL (ref 8.4–10.5)
CHLORIDE: 105 meq/L (ref 96–112)
CO2: 25 mEq/L (ref 19–32)
Creatinine, Ser: 0.9 mg/dL (ref 0.4–1.2)
GFR: 64.67 mL/min (ref >60.00)
Glucose, Bld: 127 mg/dL — ABNORMAL HIGH (ref 70–99)
POTASSIUM: 4.2 meq/L (ref 3.5–5.1)
SODIUM: 138 meq/L (ref 135–145)
TOTAL PROTEIN: 7.3 g/dL (ref 6.0–8.3)
Total Bilirubin: 0.9 mg/dL (ref 0.2–1.2)

## 2013-11-28 LAB — LIPID PANEL
CHOL/HDL RATIO: 5
Cholesterol: 180 mg/dL (ref 0–200)
HDL: 39.7 mg/dL (ref >39.00)
NONHDL: 140.3
Triglycerides: 219 mg/dL — ABNORMAL HIGH (ref 0.0–149.0)
VLDL: 43.8 mg/dL — ABNORMAL HIGH (ref 0.0–40.0)

## 2013-11-28 LAB — HEMOGLOBIN A1C: Hgb A1c MFr Bld: 6.8 % — ABNORMAL HIGH (ref 4.6–6.5)

## 2013-11-28 LAB — LDL CHOLESTEROL, DIRECT: Direct LDL: 100.1 mg/dL

## 2013-11-28 LAB — TSH: TSH: 1.91 u[IU]/mL (ref 0.35–4.50)

## 2013-12-09 ENCOUNTER — Encounter: Payer: Self-pay | Admitting: Family Medicine

## 2013-12-09 ENCOUNTER — Ambulatory Visit (INDEPENDENT_AMBULATORY_CARE_PROVIDER_SITE_OTHER): Payer: BC Managed Care – PPO | Admitting: Family Medicine

## 2013-12-09 VITALS — BP 130/72 | HR 75 | Temp 98.3°F | Ht 65.5 in | Wt 247.5 lb

## 2013-12-09 DIAGNOSIS — R7401 Elevation of levels of liver transaminase levels: Secondary | ICD-10-CM

## 2013-12-09 DIAGNOSIS — Z23 Encounter for immunization: Secondary | ICD-10-CM

## 2013-12-09 DIAGNOSIS — R740 Nonspecific elevation of levels of transaminase and lactic acid dehydrogenase [LDH]: Secondary | ICD-10-CM

## 2013-12-09 DIAGNOSIS — I10 Essential (primary) hypertension: Secondary | ICD-10-CM

## 2013-12-09 DIAGNOSIS — Z0000 Encounter for general adult medical examination without abnormal findings: Secondary | ICD-10-CM

## 2013-12-09 DIAGNOSIS — E119 Type 2 diabetes mellitus without complications: Secondary | ICD-10-CM

## 2013-12-09 MED ORDER — METOPROLOL TARTRATE 25 MG PO TABS: 25.0000 mg | ORAL_TABLET | Freq: Every day | ORAL | Status: DC

## 2013-12-09 MED ORDER — HYOSCYAMINE SULFATE 0.125 MG SL SUBL: 0.1250 mg | SUBLINGUAL_TABLET | SUBLINGUAL | Status: DC | PRN

## 2013-12-09 NOTE — Patient Instructions (Addendum)
Pneumonia vaccine today  If you are interested in a shingles/zoster vaccine - call your insurance to check on coverage,( you should not get it within 1 month of other vaccines) , then call us for a prescription  for it to take to a pharmacy that gives the shot , or make a nurse visit to get it here depending on your coverage For your achilles pain - make an appointment with Dr Lorelei Pont (sports med) when you are ready  Take care of yourself  Schedule your own mammogram  Eat healthy and get back to exercise when you can  Follow up in 6 months with labs prior

## 2013-12-09 NOTE — Assessment & Plan Note (Signed)
Mild Lab Results  Component Value Date   ALT 43* 11/28/2013   AST 54* 11/28/2013   ALKPHOS 73 11/28/2013   BILITOT 0.9 11/28/2013   Had ccy in aug 2014 Suspect fatty liver Low fat diet and wt loss adv  Will watch this-re check 6 mo

## 2013-12-09 NOTE — Assessment & Plan Note (Signed)
Lab Results  Component Value Date   HGBA1C 6.8* 11/28/2013   Up a bit  Disc need for wt loss and better (low glycemic low fat) diet and exercise  F/u 6 mo and A1C prior

## 2013-12-09 NOTE — Progress Notes (Signed)
Subjective:    Patient ID: Jamie [name], female    DOB: Feb 22, 1955, 58 y.o.   MRN: [id number]  HPI Here for health maintenance exam and to review chronic medical problems    Doing fairly well overall  Very busy - bought and sold a house and moved (very happy) Son got married and new grand baby   Wt is down 7lb, obese Has been fair with diet -wants to work on more wt loss  Has a heel that hurts- achilles L ankle/foot   She did have one fall - mowing grass -fell backwards / landed on a rock - took a long time for buttock bruise to stop hurting (ibuprofen and ice helped)   Pneumovax-has not had (had mycoplasma   Pap 3/08 and then had a hystectomy  No gyn problems   Mammogram 1/15 nl  Self exam -no lumps or changes   opthy exam- was in July/ normal   Flu shot 10/15 Tdap 9/14  ast /alt were up this draw Lab Results  Component Value Date   ALT 43* 11/28/2013   AST 54* 11/28/2013   ALKPHOS 73 11/28/2013   BILITOT 0.9 11/28/2013  had ccy aug 2014  ? Fatty liver  Her mother had NASH cirrhosis  She takes some tylenol -esp after fall in august  occ alcohol -not often at all     DM controlled Lab Results  Component Value Date   HGBA1C 6.8* 11/28/2013   this is up from 6.6  Need to watch this  Is eating more sugar and carbs    Patient Active Problem List   Diagnosis Date Noted  . Elevated transaminase level 12/09/2013  . Red blood cell antibody positive 07/02/2013  . Abnormal urine 05/15/2012  . Elevated C-reactive protein (CRP) 05/15/2012  . Stricture of anal canal 11/21/2011  . Rectal discharge 10/02/2011  . Routine general medical examination at a health care facility 09/24/2011  . Apnea 11/14/2010  . Neck pain 11/14/2010  . Nevus of lower leg 11/14/2010  . Other screening mammogram 10/31/2010  . Left sided abdominal pain 09/08/2010  . Stress reaction, emotional 05/02/2010  . Compulsive overeater 05/02/2010  . Arthritis   . Bowel obstruction   . KNEE  PAIN 10/14/2009  . HOARSENESS 07/21/2009  . Diabetes type 2, controlled 04/23/2009  . ROSACEA 09/12/2007  . Essential hypertension 05/15/2007  . Obesity 12/13/2006  . OSA (obstructive sleep apnea) 12/13/2006  . COLITIS, ULCERATIVE NOS 07/12/2006   Past Medical History  Diagnosis Date  . Gall stones 2008  . Colitis     with colectomy  . HTN (hypertension)   . Obesity   . Bowel obstruction     repeated (? possible adhesions) neg EGD  . Rosacea   . Hyperglycemia     mild-monitors A1C  . Sleep apnea     CPAP everynight  . Arthritis     generalized   Past Surgical History  Procedure Laterality Date  . Total abdominal hysterectomy  05/2006    for abscessed ovaries  . Colectomy  1993    has ileostomy  . Examination under anesthesia  12/06/2011    Procedure: EXAM UNDER ANESTHESIA;  Surgeon: Leighton Ruff, MD;  [facility];  Service: General;  Laterality: N/A;  rectal exam under anesthesia, anal dilation, pouchoscopy    . Colostomy revision  12/06/2011    Procedure: COLOSTOMY REVISION;  Surgeon: Leighton Ruff, MD;  Location: [facility];  Service: General;  Laterality: Right;  OSTOMY revision  . Cholecystectomy     History  Substance Use Topics  . Smoking status: Never Smoker   . Smokeless tobacco: Not on file  . Alcohol Use: No   Family History  Problem Relation Age of Onset  . Cancer Father     colon  . Liver cancer Father     resection secondary to mets  . Hyperlipidemia Father   . Hypertension Father   . Allergies Father   . Ulcerative colitis Father   . Hypertension Mother   . Hyperlipidemia Mother   . Cirrhosis Mother     NASH  . Diabetes Mother   . Allergies Brother    Allergies  Allergen Reactions  . Lisinopril     REACTION: felt bad/ stomach hurt/ weak and tired  . Ciprofloxacin Rash    Erythema and pruritis around IV site, erythema and rash along vein   Current Outpatient Prescriptions on File Prior to Visit    Medication Sig Dispense Refill  . Blood Glucose Monitoring Suppl (GLUCOMETER ELITE CLASSIC) KIT by Does not apply route daily. Check sugar once daily and as needed for DM2 250.0     . CINNAMON PO Take 1,000 mg by mouth daily.    Marland Kitchen glucose blood test strip 1 each by Other route daily. Use daily and as needed for DM@@ 250.0     . ibuprofen (ADVIL,MOTRIN) 200 MG tablet Take 200 mg by mouth every 6 (six) hours as needed.    . Multiple Vitamin (MULTIVITAMIN) tablet Take 1 tablet by mouth daily.      . naproxen sodium (ANAPROX) 220 MG tablet Take 220 mg by mouth as needed.    . NON FORMULARY SLEEPS WITH C-PAP    . omeprazole (PRILOSEC) 20 MG capsule Take 20 mg by mouth daily.       No current facility-administered medications on file prior to visit.     Review of Systems    Review of Systems  Constitutional: Negative for fever, appetite change, fatigue and unexpected weight change.  Eyes: Negative for pain and visual disturbance.  Respiratory: Negative for cough and shortness of breath.   Cardiovascular: Negative for cp or palpitations    Gastrointestinal: Negative for nausea, diarrhea and constipation. pos for occ trouble with stoma, pos for chronic discharge of rectal pouch (has been to surgeon for this)  Genitourinary: Negative for urgency and frequency.  Skin: Negative for pallor or rash  pos for rosacea  Neurological: Negative for weakness, light-headedness, numbness and headaches.  Hematological: Negative for adenopathy. Does not bruise/bleed easily.  Psychiatric/Behavioral: Negative for dysphoric mood. The patient is not nervous/anxious.      Objective:   Physical Exam  Constitutional: She appears well-developed and well-nourished. No distress.  obese and well appearing   HENT:  Head: Normocephalic and atraumatic.  Right Ear: External ear normal.  Left Ear: External ear normal.  Mouth/Throat: Oropharynx is clear and moist.  Eyes: Conjunctivae and EOM are normal. Pupils are  equal, round, and reactive to light. No scleral icterus.  Neck: Normal range of motion. Neck supple. No JVD present. Carotid bruit is not present. No thyromegaly present.  Cardiovascular: Normal rate, regular rhythm, normal heart sounds and intact distal pulses.  Exam reveals no gallop.   Pulmonary/Chest: Effort normal and breath sounds normal. No respiratory distress. She has no wheezes. She exhibits no tenderness.  Abdominal: Soft. Bowel sounds are normal. She exhibits no distension, no abdominal bruit and no mass. There is  no tenderness.  Ostomy bag intact   Genitourinary: No breast swelling, tenderness, discharge or bleeding.  Breast exam: No mass, nodules, thickening, tenderness, bulging, retraction, inflamation, nipple discharge or skin changes noted.  No axillary or clavicular LA.      Musculoskeletal: Normal range of motion. She exhibits no edema or tenderness.  Lymphadenopathy:    She has no cervical adenopathy.  Neurological: She is alert. She has normal reflexes. No cranial nerve deficit. She exhibits normal muscle tone. Coordination normal.  Skin: Skin is warm and dry. No rash noted. No erythema. No pallor.  Fair complexion   Few SKs  Psychiatric: She has a normal mood and affect.          Assessment & Plan:   Problem List Items Addressed This Visit      Cardiovascular and Mediastinum   Essential hypertension    bp in fair control at this time  (better on 2nd check)  BP Readings from Last 1 Encounters:  12/09/13 130/72   No changes needed Disc lifstyle change with low sodium diet and exercise  Labs reviewed      Relevant Medications      metoprolol tartrate (LOPRESSOR) tablet     Endocrine   Diabetes type 2, controlled    Lab Results  Component Value Date   HGBA1C 6.8* 11/28/2013   Up a bit  Disc need for wt loss and better (low glycemic low fat) diet and exercise  F/u 6 mo and A1C prior     Relevant Orders      Hemoglobin A1c     Other   Elevated  transaminase level    Mild Lab Results  Component Value Date   ALT 43* 11/28/2013   AST 54* 11/28/2013   ALKPHOS 73 11/28/2013   BILITOT 0.9 11/28/2013   Had [facility]cy in aug 2014 Suspect fatty liver Low fat diet and wt loss adv  Will watch this-re check 6 mo     Routine general medical examination at a health care facility    Reviewed health habits including diet and exercise and skin cancer prevention Reviewed appropriate screening tests for age  Also reviewed health mt list, fam hx and immunization status , as well as social and family history   Pneumonia vaccine today  Labs reviewed in detail  Disc need for wt loss Pt will schedule her own mammogram for January    Relevant Orders      ALT      AST

## 2013-12-09 NOTE — Assessment & Plan Note (Signed)
Discussed how this problem influences overall health and the risks it imposes  Reviewed plan for weight loss with lower calorie diet (via better food choices and also portion control or program like weight watchers) and exercise building up to or more than 30 minutes 5 days per week including some aerobic activity    

## 2013-12-09 NOTE — Progress Notes (Signed)
Pre visit review using our clinic review tool, if applicable. No additional management support is needed unless otherwise documented below in the visit note. 

## 2013-12-09 NOTE — Assessment & Plan Note (Signed)
Reviewed health habits including diet and exercise and skin cancer prevention Reviewed appropriate screening tests for age  Also reviewed health mt list, fam hx and immunization status , as well as social and family history   Pneumonia vaccine today  Labs reviewed in detail  Disc need for wt loss Pt will schedule her own mammogram for January

## 2013-12-09 NOTE — Assessment & Plan Note (Signed)
bp in fair control at this time  (better on 2nd check)  BP Readings from Last 1 Encounters:  12/09/13 130/72   No changes needed Disc lifstyle change with low sodium diet and exercise  Labs reviewed

## 2013-12-19 ENCOUNTER — Encounter: Payer: Self-pay | Admitting: Family Medicine

## 2013-12-19 ENCOUNTER — Ambulatory Visit (INDEPENDENT_AMBULATORY_CARE_PROVIDER_SITE_OTHER): Payer: BC Managed Care – PPO | Admitting: Family Medicine

## 2013-12-19 VITALS — BP 138/78 | HR 79 | Temp 98.4°F | Ht 66.0 in | Wt 248.5 lb

## 2013-12-19 DIAGNOSIS — M7672 Peroneal tendinitis, left leg: Secondary | ICD-10-CM

## 2013-12-19 DIAGNOSIS — M7662 Achilles tendinitis, left leg: Secondary | ICD-10-CM

## 2013-12-19 NOTE — Progress Notes (Signed)
Pre visit review using our clinic review tool, if applicable. No additional management support is needed unless otherwise documented below in the visit note. 

## 2013-12-19 NOTE — Progress Notes (Signed)
Dr. Frederico Hamman T. Hale Chalfin, MD, [facility] [place], [address] Phone: [phone] Fax: [phone]  12/19/2013  Patient: Jamie [name], MRN: [id number], DOB: 12/23/1955, 58 y.o.  Primary Physician:  Loura Pardon, MD  Chief Complaint: Achilles Pain; Leg Pain; and Knee Pain  Subjective:   Dear Dr. Glori Bickers,  Thank you for having me see Jamie [name] in consultation today at [facility] at [facility] for her problem with LEFT foot and ankle pain.  As you may recall, she is a 58 y.o. year old female with a history of    LEFT heel pain and ankle pain that began 3 or 4 weeks ago, and she primarily was having pain in the posterior aspect of her heel.  No occult injury.  In the last 1-2 days over Thanksgiving, the patient has started to develop even more pain in the posterior aspect of her knee.  She had a similar episode a couple of years ago, and did some physical therapy and had relief of her knee symptoms.  She already takes Aleve daily, and she has had an ostomy placed for ulcerative colitis.  She was on prednisone for greater than 20 years per her report to me.  The posterior aspect of her heel on the LEFT has improved some over the last week.  Already taking alleve once a day   Past Medical History  Diagnosis Date  . Gall stones 2008  . Colitis     with colectomy  . HTN (hypertension)   . Obesity   . Bowel obstruction     repeated (? possible adhesions) neg EGD  . Rosacea   . Hyperglycemia     mild-monitors A1C  . Sleep apnea     CPAP everynight  . Arthritis     generalized   Past Surgical History  Procedure Laterality Date  . Total abdominal hysterectomy  05/2006    for abscessed ovaries  . Colectomy  1993    has ileostomy  . Examination under anesthesia  12/06/2011    Procedure: EXAM UNDER ANESTHESIA;  Surgeon: Leighton Ruff, MD;  Location: [facility];  Service: General;   Laterality: N/A;  rectal exam under anesthesia, anal dilation, pouchoscopy    . Colostomy revision  12/06/2011    Procedure: COLOSTOMY REVISION;  Surgeon: Leighton Ruff, MD;  Location: [facility];  Service: General;  Laterality: Right;  OSTOMY revision  . Cholecystectomy     History   Social History  . Marital Status: Married    Spouse Name: N/A    Number of Children: 3  . Years of Education: N/A   Occupational History  . Guardian Ad Litem    Social History Main Topics  . Smoking status: Never Smoker   . Smokeless tobacco: None  . Alcohol Use: No  . Drug Use: No  . Sexual Activity: None   Other Topics Concern  . None   Social History Narrative   Family History  Problem Relation Age of Onset  . Cancer Father     colon  . Liver cancer Father     resection secondary to mets  . Hyperlipidemia Father   . Hypertension Father   . Allergies Father   . Ulcerative colitis Father   . Hypertension Mother   . Hyperlipidemia Mother   . Cirrhosis Mother     NASH  . Diabetes Mother   . Allergies Brother  Medications and Allergies reviewed   GEN: No fevers, chills. Nontoxic. Primarily MSK c/o today. MSK: Detailed in the HPI GI: tolerating PO intake without difficulty Neuro: No numbness, parasthesias, or tingling associated. Otherwise the pertinent positives of the ROS are noted above.   Objective:   BP 138/78 mmHg  Pulse 79  Temp(Src) 98.4 F (36.9 C) (Oral)  Ht 5' 6"  (1.676 m)  Wt 248 lb 8 oz (112.719 kg)  BMI 40.13 kg/m2  SpO2 97%    GEN: Well-developed,well-nourished,in no acute distress; alert,appropriate and cooperative throughout examination HEENT: Normocephalic and atraumatic without obvious abnormalities. Ears, externally no deformities PULM: Breathing comfortably in no respiratory distress EXT: No clubbing, cyanosis, or edema PSYCH: Normally interactive. Cooperative during the interview. Pleasant. Friendly and conversant. Not anxious  or depressed appearing. Normal, full affect.  ANKLE: L Echymosis: no Edema: no ROM: Full dorsi and plantar flexion, inversion, eversion Gait: heel toe, non-antalgic Lateral Mall: NT Medial Mall: NT Talus: NT Navicular: NT Cuboid: NT Calcaneous: NT Metatarsals: NT 5th MT: NT Phalanges: NT Achilles: TTP no nodule Plantar Fascia: NT Fat Pad: NT Peroneals: TTP Post Tib: NT Great Toe: Nml motion Ant Drawer: neg Talar Tilt: neg ATFL: NT CFL: NT Deltoid: NT Str: 5/5 Other Special tests: none Sensation: intact   LEFT knee: Full extension.  Minimal movement at the patella.  Flexion to 120.  Stable medial collateral ligament, lateral collateral ligament, PCL, and anterior cruciate ligament.  The patient is notably tender along both the medial and lateral joint lines.  Flexion pinch testing does cause some pain.  McMurray's test also causes some pain, but it does not cause mechanical symptoms.  All strength testing at the knee is 5/5.  Neurovascularly intact.  Assessment and Plan:   Achilles tendinitis of left lower extremity  Peroneal tendinitis, left  The patient likely has knee arthritis, and I suspect that this is secondarily been flared up.  Continue with low-dose Aleve given her ostomy, and she has relatively mild Achilles tendinopathy and peroneal tendinopathy.  I think that they will improve with basic rehabilitation.  Classic rehabilitation reviewed with the patient.  Follow-up: in 1 mo if not improved    Thank you for having [place] see [name] in consultation.  Feel free to contact me with any questions.  Signed,  Maud Deed. Aadil Sur, MD  Patient Instructions  Achilles Rehab  Calf raises on a step First lower and then raise on 1 foot If this is painful lower on 1 foot but do the heel raise on both feet  Begin with 3 sets of 10 repetitions  Increase by 5 repetitions every 3 days  Goal is 3 sets of 30 repetitions  Do with both knee straight and knee at  20 degrees of flexion  If pain persists at 3 sets of 30 - add backpack with 5 lbs Increase by 5 lbs per week to max of 30 lbs   Peroneal rehab Begin with easy walking, heel, toe and backwards * Try to pick an easy location like a hallway or a room in your house and do one of these each time that you go through this area.  Towel "Scrunch Ups" Use a hand towel or a moderate size towel Foot flat down on the towel Use toes to "scrunch up the towel" straight up and down, and going to the right and left.  3 sets of 20 * Can be done watching TV, reading, or sitting and relaxing.  Patient's Medications  New Prescriptions   No medications on file  Previous Medications   BLOOD GLUCOSE MONITORING SUPPL (GLUCOMETER ELITE CLASSIC) KIT    by Does not apply route daily. Check sugar once daily and as needed for DM2 250.0    CINNAMON PO    Take 1,000 mg by mouth daily.   GLUCOSE BLOOD TEST STRIP    1 each by Other route daily. Use daily and as needed for DM@@ 250.0    HYOSCYAMINE (LEVSIN SL) 0.125 MG SL TABLET    Take 1 tablet (0.125 mg total) by mouth every 4 (four) hours as needed. For abdominal cramps   IBUPROFEN (ADVIL,MOTRIN) 200 MG TABLET    Take 200 mg by mouth every 6 (six) hours as needed.   METOPROLOL TARTRATE (LOPRESSOR) 25 MG TABLET    Take 1 tablet (25 mg total) by mouth daily.   MULTIPLE VITAMIN (MULTIVITAMIN) TABLET    Take 1 tablet by mouth daily.     NAPROXEN SODIUM (ANAPROX) 220 MG TABLET    Take 220 mg by mouth as needed.   NON FORMULARY    SLEEPS WITH C-PAP   OMEPRAZOLE (PRILOSEC) 20 MG CAPSULE    Take 20 mg by mouth daily.    Modified Medications   No medications on file  Discontinued Medications   No medications on file

## 2013-12-19 NOTE — Patient Instructions (Signed)
Achilles Rehab  Calf raises on a step First lower and then raise on 1 foot If this is painful lower on 1 foot but do the heel raise on both feet  Begin with 3 sets of 10 repetitions  Increase by 5 repetitions every 3 days  Goal is 3 sets of 30 repetitions  Do with both knee straight and knee at 20 degrees of flexion  If pain persists at 3 sets of 30 - add backpack with 5 lbs Increase by 5 lbs per week to max of 30 lbs   Peroneal rehab Begin with easy walking, heel, toe and backwards * Try to pick an easy location like a hallway or a room in your house and do one of these each time that you go through this area.  Towel "Scrunch Ups" Use a hand towel or a moderate size towel Foot flat down on the towel Use toes to "scrunch up the towel" straight up and down, and going to the right and left.  3 sets of 20 * Can be done watching TV, reading, or sitting and relaxing.

## 2014-01-09 ENCOUNTER — Emergency Department: Payer: Self-pay | Admitting: Emergency Medicine

## 2014-01-09 LAB — SEDIMENTATION RATE: Erythrocyte Sed Rate: 16 mm/hr (ref 0–30)

## 2014-01-09 LAB — CBC WITH DIFFERENTIAL/PLATELET
BASOS PCT: 0.5 %
Basophil #: 0 10*3/uL (ref 0.0–0.1)
Eosinophil #: 0.5 10*3/uL (ref 0.0–0.7)
Eosinophil %: 4.7 %
HCT: 46 % (ref 35.0–47.0)
HGB: 15.4 g/dL (ref 12.0–16.0)
LYMPHS ABS: 2.4 10*3/uL (ref 1.0–3.6)
LYMPHS PCT: 24.4 %
MCH: 31.3 pg (ref 26.0–34.0)
MCHC: 33.5 g/dL (ref 32.0–36.0)
MCV: 94 fL (ref 80–100)
Monocyte #: 0.6 x10 3/mm (ref 0.2–0.9)
Monocyte %: 6.3 %
NEUTROS PCT: 64.1 %
Neutrophil #: 6.3 10*3/uL (ref 1.4–6.5)
Platelet: 293 10*3/uL (ref 150–440)
RBC: 4.91 10*6/uL (ref 3.80–5.20)
RDW: 13.2 % (ref 11.5–14.5)
WBC: 9.8 10*3/uL (ref 3.6–11.0)

## 2014-01-09 LAB — PROTIME-INR
INR: 0.9
Prothrombin Time: 11.9 secs (ref 11.5–14.7)

## 2014-01-09 LAB — COMPREHENSIVE METABOLIC PANEL
ALBUMIN: 3.7 g/dL (ref 3.4–5.0)
ALK PHOS: 100 U/L
ANION GAP: 10 (ref 7–16)
BUN: 13 mg/dL (ref 7–18)
Bilirubin,Total: 0.4 mg/dL (ref 0.2–1.0)
CO2: 22 mmol/L (ref 21–32)
Calcium, Total: 9.4 mg/dL (ref 8.5–10.1)
Chloride: 105 mmol/L (ref 98–107)
Creatinine: 1.08 mg/dL (ref 0.60–1.30)
EGFR (African American): >60
EGFR (Non-African Amer.): 55 — ABNORMAL LOW
Glucose: 142 mg/dL — ABNORMAL HIGH (ref 65–99)
Osmolality: 276 (ref 275–301)
Potassium: 4.3 mmol/L (ref 3.5–5.1)
SGOT(AST): 43 U/L — ABNORMAL HIGH (ref 15–37)
SGPT (ALT): 55 U/L
Sodium: 137 mmol/L (ref 136–145)
TOTAL PROTEIN: 8.1 g/dL (ref 6.4–8.2)

## 2014-01-09 LAB — TROPONIN I: Troponin-I: <0.02 ng/mL

## 2014-02-06 ENCOUNTER — Ambulatory Visit: Payer: Self-pay | Admitting: Family Medicine

## 2014-02-09 ENCOUNTER — Encounter: Payer: Self-pay | Admitting: Family Medicine

## 2014-06-02 ENCOUNTER — Other Ambulatory Visit (INDEPENDENT_AMBULATORY_CARE_PROVIDER_SITE_OTHER): Payer: BC Managed Care – PPO

## 2014-06-02 DIAGNOSIS — E119 Type 2 diabetes mellitus without complications: Secondary | ICD-10-CM | POA: Diagnosis not present

## 2014-06-02 DIAGNOSIS — Z0000 Encounter for general adult medical examination without abnormal findings: Secondary | ICD-10-CM

## 2014-06-02 LAB — AST: AST: 21 U/L (ref 0–37)

## 2014-06-02 LAB — ALT: ALT: 25 U/L (ref 0–35)

## 2014-06-02 LAB — HEMOGLOBIN A1C: Hgb A1c MFr Bld: 6.8 % — ABNORMAL HIGH (ref 4.6–6.5)

## 2014-06-09 ENCOUNTER — Ambulatory Visit (INDEPENDENT_AMBULATORY_CARE_PROVIDER_SITE_OTHER): Payer: BC Managed Care – PPO | Admitting: Family Medicine

## 2014-06-09 ENCOUNTER — Encounter: Payer: Self-pay | Admitting: Family Medicine

## 2014-06-09 VITALS — BP 122/80 | HR 69 | Temp 98.1°F | Ht 65.5 in | Wt 253.0 lb

## 2014-06-09 DIAGNOSIS — I10 Essential (primary) hypertension: Secondary | ICD-10-CM

## 2014-06-09 DIAGNOSIS — R7401 Elevation of levels of liver transaminase levels: Secondary | ICD-10-CM

## 2014-06-09 DIAGNOSIS — E669 Obesity, unspecified: Secondary | ICD-10-CM | POA: Diagnosis not present

## 2014-06-09 DIAGNOSIS — R740 Nonspecific elevation of levels of transaminase and lactic acid dehydrogenase [LDH]: Secondary | ICD-10-CM | POA: Diagnosis not present

## 2014-06-09 DIAGNOSIS — E119 Type 2 diabetes mellitus without complications: Secondary | ICD-10-CM

## 2014-06-09 NOTE — Assessment & Plan Note (Signed)
Unsure of cause Now normalized No symptoms  Will continue to monitor

## 2014-06-09 NOTE — Assessment & Plan Note (Signed)
Lab Results  Component Value Date   HGBA1C 6.8* 06/02/2014   Diet controlled -pt admits she could do better  Rev guidelines for DM diet and also exercise - she has a hard time with motivation  Suggested working with her husband on it  F/u 6 mo

## 2014-06-09 NOTE — Assessment & Plan Note (Signed)
bp in fair control at this time  BP Readings from Last 1 Encounters:  06/09/14 122/80   No changes needed Disc lifstyle change with low sodium diet and exercise  Enc to start walking for exercise and work on wt loss

## 2014-06-09 NOTE — Patient Instructions (Signed)
A1C is stable at 6.8  Work on diet/exercise and weight loss  Liver tests are better Blood pressure is good /stable  Take care of yourself  If you are interested in a shingles/zoster vaccine - call your insurance to check on coverage,( you should not get it within 1 month of other vaccines) , then call us for a prescription  for it to take to a pharmacy that gives the shot , or make a nurse visit to get it here depending on your coverage   Follow up in 6 months for annual exam with labs prior

## 2014-06-09 NOTE — Progress Notes (Signed)
Subjective:    Patient ID: Jamie [name], female    DOB: January 01, 1956, 59 y.o.   MRN: [id number]  HPI Here for f/u of chronic medical problems   Doing well overall - just had a birthday!    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  Obese  bp is stable today  No cp or palpitations or headaches or edema  No side effects to medicines  BP Readings from Last 3 Encounters:  06/09/14 138/74  12/19/13 138/78  12/09/13 130/72     Had elevated ast/alt in the past Lab Results  Component Value Date   ALT 25 06/02/2014   AST 21 06/02/2014   ALKPHOS 100 01/09/2014   BILITOT 0.9 11/28/2013    Improved now  As a rule does not drink alcohol or take tylenol   Diabetes Home sugar results - does not check often  DM diet -not good  Exercise - not optimal-wants to work on it  [employer] at all  A1C last  Lab Results  Component Value Date   HGBA1C 6.8* 06/02/2014   Stable from last time  No medication- is diet controlled  Renal protection-cannot have ace due to allergy Last eye exam 7/15     Patient Active Problem List   Diagnosis Date Noted  . Elevated transaminase level 12/09/2013  . Red blood cell antibody positive 07/02/2013  . Abnormal urine 05/15/2012  . Elevated C-reactive protein (CRP) 05/15/2012  . Stricture of anal canal 11/21/2011  . Rectal discharge 10/02/2011  . Routine general medical examination at a health care facility 09/24/2011  . Apnea 11/14/2010  . Neck pain 11/14/2010  . Nevus of lower leg 11/14/2010  . Other screening mammogram 10/31/2010  . Left sided abdominal pain 09/08/2010  . Stress reaction, emotional 05/02/2010  . Compulsive overeater 05/02/2010  . Arthritis   .  Bowel obstruction   . KNEE PAIN 10/14/2009  . HOARSENESS 07/21/2009  . Diabetes type 2, controlled 04/23/2009  . ROSACEA 09/12/2007  . Essential hypertension 05/15/2007  . Obesity 12/13/2006  . OSA (obstructive sleep apnea) 12/13/2006  . COLITIS, ULCERATIVE NOS 07/12/2006   Past Medical History  Diagnosis Date  . Gall stones 2008  . Colitis     with colectomy  . HTN (hypertension)   . Obesity   . Bowel obstruction     repeated (? possible adhesions) neg EGD  . Rosacea   . Hyperglycemia     mild-monitors A1C  . Sleep apnea     CPAP everynight  . Arthritis     generalized   Past Surgical History  Procedure Laterality Date  . Total abdominal hysterectomy  05/2006    for abscessed ovaries  . Colectomy  1993    has ileostomy  . Examination under anesthesia  12/06/2011    Procedure: EXAM UNDER ANESTHESIA;  Surgeon: Leighton Ruff, MD;  Location: [facility];  Service: General;  Laterality: N/A;  rectal exam under anesthesia, anal dilation, pouchoscopy    . Colostomy revision  12/06/2011    Procedure: COLOSTOMY REVISION;  Surgeon: Leighton Ruff, MD;  Location: [facility];  Service: General;  Laterality: Right;  OSTOMY revision  . Cholecystectomy     History  Substance Use Topics  . Smoking status: Never Smoker   . Smokeless tobacco: Not on file  . Alcohol Use: No   Family History  Problem Relation Age of Onset  . Cancer Father     colon  . Liver cancer Father     resection secondary to mets  . Hyperlipidemia Father   . Hypertension Father   . Allergies Father   . Ulcerative colitis Father   . Hypertension Mother   . Hyperlipidemia Mother   . Cirrhosis Mother     NASH  . Diabetes Mother   . Allergies Brother    Allergies  Allergen Reactions  . Lisinopril     REACTION: felt bad/ stomach hurt/ weak and tired  . Ciprofloxacin Rash    Erythema and pruritis around IV site, erythema and rash along vein   Current Outpatient  Prescriptions on File Prior to Visit  Medication Sig Dispense Refill  . Blood Glucose Monitoring Suppl (GLUCOMETER ELITE CLASSIC) KIT by Does not apply route daily. Check sugar once daily and as needed for DM2 250.0     . CINNAMON PO Take 1,000 mg by mouth daily.    Marland Kitchen glucose blood test strip 1 each by Other route daily. Use daily and as needed for DM@@ 250.0     . hyoscyamine (LEVSIN SL) 0.125 MG SL tablet Take 1 tablet (0.125 mg total) by mouth every 4 (four) hours as needed. For abdominal cramps 30 tablet 11  . ibuprofen (ADVIL,MOTRIN) 200 MG tablet Take 200 mg by mouth every 6 (six) hours as needed.    . metoprolol tartrate (LOPRESSOR) 25 MG tablet Take 1 tablet (25 mg total) by mouth daily. 90 tablet 3  . Multiple Vitamin (MULTIVITAMIN) tablet Take 1 tablet by mouth daily.      . naproxen sodium (ANAPROX) 220 MG tablet Take 220 mg by mouth as needed.    . NON FORMULARY SLEEPS WITH C-PAP    . omeprazole (PRILOSEC) 20 MG capsule Take 20 mg by mouth every other day.      No current facility-administered medications on file prior to visit.    Review of Systems Review of Systems  Constitutional: Negative for fever, appetite change, fatigue and unexpected weight change.  Eyes: Negative for pain and visual disturbance.  Respiratory: Negative for cough and shortness of breath.   Cardiovascular: Negative for cp or palpitations    Gastrointestinal: Negative for nausea, diarrhea and constipation.  Genitourinary: Negative for urgency and frequency.  Skin: Negative for pallor or rash   Neurological: Negative for weakness, light-headedness, numbness and headaches.  Hematological: Negative for adenopathy. Does not bruise/bleed easily.  Psychiatric/Behavioral: Negative for dysphoric mood. The patient is not nervous/anxious.         Objective:   Physical Exam  Constitutional: She appears well-developed and well-nourished. No distress.  obese and well appearing   HENT:  Head: Normocephalic and  atraumatic.  Mouth/Throat: Oropharynx is clear and moist.  Eyes: Conjunctivae and EOM are normal. Pupils are equal, round, and reactive to light.  Neck: Normal range of motion. Neck supple. No JVD present. Carotid bruit is not present. No thyromegaly present.  Cardiovascular: Normal rate, regular rhythm, normal heart sounds and intact distal pulses.  Exam reveals no gallop.   Pulmonary/Chest: Effort normal and breath sounds normal. No respiratory distress. She has no wheezes. She has no rales.  No crackles  Abdominal: Soft. Bowel sounds are normal. She  exhibits no distension, no abdominal bruit and no mass. There is no tenderness.  Healthy appearing stoma   Musculoskeletal: She exhibits no edema or tenderness.  Lymphadenopathy:    She has no cervical adenopathy.  Neurological: She is alert. She has normal reflexes.  Skin: Skin is warm and dry. No rash noted.  Rosacea noted on face   Psychiatric: She has a normal mood and affect.          Assessment & Plan:   Problem List Items Addressed This Visit    Diabetes type 2, controlled    Lab Results  Component Value Date   HGBA1C 6.8* 06/02/2014   Diet controlled -pt admits she could do better  Rev guidelines for DM diet and also exercise - she has a hard time with motivation  Suggested working with her husband on it  F/u 6 mo       Elevated transaminase level    Unsure of cause Now normalized No symptoms  Will continue to monitor       Essential hypertension - Primary    bp in fair control at this time  BP Readings from Last 1 Encounters:  06/09/14 122/80   No changes needed Disc lifstyle change with low sodium diet and exercise  Enc to start walking for exercise and work on wt loss       Obesity    Discussed how this problem influences overall health and the risks it imposes  Reviewed plan for weight loss with lower calorie diet (via better food choices and also portion control or program like weight watchers) and  exercise building up to or more than 30 minutes 5 days per week including some aerobic activity   We reviewed barriers to lifestyle change and motivation today

## 2014-06-09 NOTE — Assessment & Plan Note (Signed)
Discussed how this problem influences overall health and the risks it imposes  Reviewed plan for weight loss with lower calorie diet (via better food choices and also portion control or program like weight watchers) and exercise building up to or more than 30 minutes 5 days per week including some aerobic activity   We reviewed barriers to lifestyle change and motivation today

## 2014-06-09 NOTE — Progress Notes (Signed)
Pre visit review using our clinic review tool, if applicable. No additional management support is needed unless otherwise documented below in the visit note. 

## 2014-06-25 ENCOUNTER — Telehealth: Payer: Self-pay

## 2014-06-25 NOTE — Telephone Encounter (Signed)
Para med for [employer] left v/m requesting cb about med record request sent on 06/24/14; wanting to make sure received. Left v/m that received on 06/24/14 and was emailed to [facility]; takes 14-16 business days for records to be ready.

## 2014-07-25 ENCOUNTER — Emergency Department
Admission: EM | Admit: 2014-07-25 | Discharge: 2014-07-25 | Disposition: A | Discharge status: Home / Self Care | Payer: BC Managed Care – PPO | Attending: Emergency Medicine | Admitting: Emergency Medicine

## 2014-07-25 DIAGNOSIS — I10 Essential (primary) hypertension: Secondary | ICD-10-CM | POA: Insufficient documentation

## 2014-07-25 DIAGNOSIS — E119 Type 2 diabetes mellitus without complications: Secondary | ICD-10-CM | POA: Insufficient documentation

## 2014-07-25 DIAGNOSIS — R1110 Vomiting, unspecified: Secondary | ICD-10-CM | POA: Insufficient documentation

## 2014-07-25 DIAGNOSIS — R197 Diarrhea, unspecified: Secondary | ICD-10-CM

## 2014-07-25 DIAGNOSIS — K529 Noninfective gastroenteritis and colitis, unspecified: Secondary | ICD-10-CM

## 2014-07-25 DIAGNOSIS — Z79899 Other long term (current) drug therapy: Secondary | ICD-10-CM | POA: Insufficient documentation

## 2014-07-25 HISTORY — DX: Noninfective gastroenteritis and colitis, unspecified: K52.9

## 2014-07-25 LAB — COMPREHENSIVE METABOLIC PANEL
ALK PHOS: 77 U/L (ref 38–126)
ALT: 49 U/L (ref 14–54)
ANION GAP: 15 (ref 5–15)
AST: 60 U/L — ABNORMAL HIGH (ref 15–41)
Albumin: 4.3 g/dL (ref 3.5–5.0)
BILIRUBIN TOTAL: 1.3 mg/dL — AB (ref 0.3–1.2)
BUN: 18 mg/dL (ref 6–20)
CO2: 21 mmol/L — ABNORMAL LOW (ref 22–32)
Calcium: 9.9 mg/dL (ref 8.9–10.3)
Chloride: 102 mmol/L (ref 101–111)
Creatinine, Ser: 1.55 mg/dL — ABNORMAL HIGH (ref 0.44–1.00)
GFR calc non Af Amer: 35 mL/min — ABNORMAL LOW (ref >60)
GFR, EST AFRICAN AMERICAN: 41 mL/min — AB (ref >60)
GLUCOSE: 247 mg/dL — AB (ref 65–99)
POTASSIUM: 4.6 mmol/L (ref 3.5–5.1)
Sodium: 138 mmol/L (ref 135–145)
Total Protein: 8.3 g/dL — ABNORMAL HIGH (ref 6.5–8.1)

## 2014-07-25 LAB — CBC WITH DIFFERENTIAL/PLATELET
BASOS ABS: 0.3 10*3/uL — AB (ref 0–0.1)
Basophils Relative: 2 %
EOS ABS: 0 10*3/uL (ref 0–0.7)
Eosinophils Relative: 0 %
HEMATOCRIT: 48.9 % — AB (ref 35.0–47.0)
Hemoglobin: 16.1 g/dL — ABNORMAL HIGH (ref 12.0–16.0)
LYMPHS ABS: 0.3 10*3/uL — AB (ref 1.0–3.6)
Lymphocytes Relative: 2 %
MCH: 30.8 pg (ref 26.0–34.0)
MCHC: 32.8 g/dL (ref 32.0–36.0)
MCV: 93.9 fL (ref 80.0–100.0)
MONO ABS: 0.4 10*3/uL (ref 0.2–0.9)
MONOS PCT: 3 %
Neutro Abs: 14.1 10*3/uL — ABNORMAL HIGH (ref 1.4–6.5)
Neutrophils Relative %: 93 %
Platelets: 253 10*3/uL (ref 150–440)
RBC: 5.21 MIL/uL — ABNORMAL HIGH (ref 3.80–5.20)
RDW: 13.5 % (ref 11.5–14.5)
WBC: 15.2 10*3/uL — AB (ref 3.6–11.0)

## 2014-07-25 LAB — LIPASE, BLOOD: Lipase: 54 U/L — ABNORMAL HIGH (ref 22–51)

## 2014-07-25 MED ORDER — SODIUM CHLORIDE 0.9 % IV BOLUS (SEPSIS)
1000.0000 mL | Freq: Once | INTRAVENOUS | Status: AC
  Administered 2014-07-25: 1000 mL via INTRAVENOUS

## 2014-07-25 MED ORDER — LOPERAMIDE HCL 2 MG PO CAPS
4.0000 mg | ORAL_CAPSULE | Freq: Once | ORAL | Status: AC
Start: 2014-07-25 — End: 2014-07-25
  Administered 2014-07-25: 4 mg via ORAL

## 2014-07-25 MED ORDER — ONDANSETRON HCL 4 MG/2ML IJ SOLN
INTRAMUSCULAR | Status: AC
  Administered 2014-07-25: 4 mg via INTRAVENOUS
  Filled 2014-07-25: qty 2

## 2014-07-25 MED ORDER — ONDANSETRON 4 MG PO TBDP: 4.0000 mg | ORAL_TABLET | ORAL | Status: DC | PRN

## 2014-07-25 MED ORDER — LOPERAMIDE HCL 2 MG PO CAPS
ORAL_CAPSULE | ORAL | Status: AC
  Administered 2014-07-25: 4 mg via ORAL
  Filled 2014-07-25: qty 2

## 2014-07-25 MED ORDER — ONDANSETRON HCL 4 MG/2ML IJ SOLN
4.0000 mg | Freq: Once | INTRAMUSCULAR | Status: AC
  Administered 2014-07-25: 4 mg via INTRAVENOUS

## 2014-07-25 NOTE — ED Provider Notes (Signed)
[facility] Emergency Department Provider Note  ____________________________________________  Time seen: 5:45 AM  I have reviewed the triage vital signs and the nursing notes.   HISTORY  Chief Complaint Diarrhea; Nausea; and Emesis      HPI Jamie [name] is a 59 y.o. female presents with multiple episodes of vomiting and nonbloody diarrhea from ileostomy onset 3-4 hours prior to presentation. Patient states she has a sick contact at home with the same symptoms. Of note patient had a total colectomy performed 23 years ago with ileostomy since that point.     Past Medical History  Diagnosis Date  . Gall stones 2008  . Colitis     with colectomy  . HTN (hypertension)   . Obesity   . Bowel obstruction     repeated (? possible adhesions) neg EGD  . Rosacea   . Hyperglycemia     mild-monitors A1C  . Sleep apnea     CPAP everynight  . Arthritis     generalized    Patient Active Problem List   Diagnosis Date Noted  . Elevated transaminase level 12/09/2013  . Red blood cell antibody positive 07/02/2013  . Abnormal urine 05/15/2012  . Elevated C-reactive protein (CRP) 05/15/2012  . Stricture of anal canal 11/21/2011  . Rectal discharge 10/02/2011  . Routine general medical examination at a health care facility 09/24/2011  . Apnea 11/14/2010  . Neck pain 11/14/2010  . Nevus of lower leg 11/14/2010  . Other screening mammogram 10/31/2010  . Left sided abdominal pain 09/08/2010  . Stress reaction, emotional 05/02/2010  . Compulsive overeater 05/02/2010  . Arthritis   . Bowel obstruction   . KNEE PAIN 10/14/2009  . HOARSENESS 07/21/2009  . Diabetes type 2, controlled 04/23/2009  . ROSACEA 09/12/2007  . Essential hypertension 05/15/2007  . Obesity 12/13/2006  . OSA (obstructive sleep apnea) 12/13/2006  . COLITIS, ULCERATIVE NOS 07/12/2006    Past Surgical History  Procedure Laterality Date  . Total abdominal hysterectomy  05/2006    for  abscessed ovaries  . Colectomy  1993    has ileostomy  . Examination under anesthesia  12/06/2011    Procedure: EXAM UNDER ANESTHESIA;  Surgeon: Leighton Ruff, MD;  Location: [facility];  Service: General;  Laterality: N/A;  rectal exam under anesthesia, anal dilation, pouchoscopy    . Colostomy revision  12/06/2011    Procedure: COLOSTOMY REVISION;  Surgeon: Leighton Ruff, MD;  Location: [facility];  Service: General;  Laterality: Right;  OSTOMY revision  . Cholecystectomy      Current Outpatient Rx  Name  Route  Sig  Dispense  Refill  . Blood Glucose Monitoring Suppl (GLUCOMETER ELITE CLASSIC) KIT   Does not apply   by Does not apply route daily. Check sugar once daily and as needed for DM2 250.0          . CINNAMON PO   Oral   Take 1,000 mg by mouth daily.         Marland Kitchen glucose blood test strip   Other   1 each by Other route daily. Use daily and as needed for DM@@ 250.0          . hyoscyamine (LEVSIN SL) 0.125 MG SL tablet   Oral   Take 1 tablet (0.125 mg total) by mouth every 4 (four) hours as needed. For abdominal cramps   30 tablet   11   . ibuprofen (ADVIL,MOTRIN) 200 MG tablet  Oral   Take 200 mg by mouth every 6 (six) hours as needed.         . metoprolol tartrate (LOPRESSOR) 25 MG tablet   Oral   Take 1 tablet (25 mg total) by mouth daily.   90 tablet   3   . Multiple Vitamin (MULTIVITAMIN) tablet   Oral   Take 1 tablet by mouth daily.           . naproxen sodium (ANAPROX) 220 MG tablet   Oral   Take 220 mg by mouth as needed.         . NON FORMULARY      SLEEPS WITH C-PAP         . omeprazole (PRILOSEC) 20 MG capsule   Oral   Take 20 mg by mouth every other day.            Allergies Lisinopril and Ciprofloxacin  Family History  Problem Relation Age of Onset  . Cancer Father     colon  . Liver cancer Father     resection secondary to mets  . Hyperlipidemia Father   . Hypertension Father   .  Allergies Father   . Ulcerative colitis Father   . Hypertension Mother   . Hyperlipidemia Mother   . Cirrhosis Mother     NASH  . Diabetes Mother   . Allergies Brother     Social History History  Substance Use Topics  . Smoking status: Never Smoker   . Smokeless tobacco: Not on file  . Alcohol Use: No    Review of Systems  Constitutional: Negative for fever. Eyes: Negative for visual changes. ENT: Negative for sore throat. Cardiovascular: Negative for chest pain. Respiratory: Negative for shortness of breath. Gastrointestinal: Negative for abdominal pain, vomiting.Positive for diarrhea. Genitourinary: Negative for dysuria. Musculoskeletal: Negative for back pain. Skin: Negative for rash. Neurological: Negative for headaches, focal weakness or numbness.   10-point ROS otherwise negative.  ____________________________________________   PHYSICAL EXAM:  VITAL SIGNS: ED Triage Vitals  Enc Vitals Group     BP 07/25/14 0241 123/67 mmHg     Pulse Rate 07/25/14 0241 108     Resp 07/25/14 0241 18     Temp 07/25/14 0241 98.7 F (37.1 C)     Temp Source 07/25/14 0241 Oral     SpO2 07/25/14 0241 97 %     Weight 07/25/14 0241 253 lb (114.76 kg)     Height 07/25/14 0241 5' 6"  (1.676 m)     Head Cir --      Peak Flow --      Pain Score 07/25/14 0243 0     Pain Loc --      Pain Edu? --      Excl. in [place]? --      Constitutional: Alert and oriented. Well appearing and in no distress. Eyes: Conjunctivae are normal. PERRL. Normal extraocular movements. ENT   Head: Normocephalic and atraumatic.   Nose: No congestion/rhinnorhea.   Mouth/Throat: Mucous membranes are moist.   Neck: No stridor. Hematological/Lymphatic/Immunilogical: No cervical lymphadenopathy. Cardiovascular: Normal rate, regular rhythm. Normal and symmetric distal pulses are present in all extremities. No murmurs, rubs, or gallops. Respiratory: Normal respiratory effort without tachypnea nor  retractions. Breath sounds are clear and equal bilaterally. No wheezes/rales/rhonchi. Gastrointestinal: Soft and nontender. No distention. There is no CVA tenderness. Genitourinary: deferred Musculoskeletal: Nontender with normal range of motion in all extremities. No joint effusions.  No lower extremity tenderness nor edema.  Neurologic:  Normal speech and language. No gross focal neurologic deficits are appreciated. Speech is normal.  Skin:  Skin is warm, dry and intact. No rash noted. Psychiatric: Mood and affect are normal. Speech and behavior are normal. Patient exhibits appropriate insight and judgment.  ____________________________________________    LABS (pertinent positives/negatives)  Labs Reviewed  CBC WITH DIFFERENTIAL/PLATELET - Abnormal; Notable for the following:    WBC 15.2 (*)    RBC 5.21 (*)    Hemoglobin 16.1 (*)    HCT 48.9 (*)    Neutro Abs 14.1 (*)    Lymphs Abs 0.3 (*)    Basophils Absolute 0.3 (*)    All other components within normal limits  COMPREHENSIVE METABOLIC PANEL - Abnormal; Notable for the following:    CO2 21 (*)    Glucose, Bld 247 (*)    Creatinine, Ser 1.55 (*)    Total Protein 8.3 (*)    AST 60 (*)    Total Bilirubin 1.3 (*)    GFR calc non Af Amer 35 (*)    GFR calc Af Amer 41 (*)    All other components within normal limits  LIPASE, BLOOD - Abnormal; Notable for the following:    Lipase 54 (*)    All other components within normal limits       RADIOLOGY      INITIAL IMPRESSION / ASSESSMENT AND PLAN / ED COURSE  Pertinent labs & imaging results that were available during my care of the patient were reviewed by me and considered in my medical decision making (see chart for details).  History of physical exam consistent with infectious diarrhea. Patient received ODT Zofran and Imodium emergency Department with improvement of symptoms. We'll prescribe the same for home in addition patient received 2 L IV normal  saline.  ____________________________________________   FINAL CLINICAL IMPRESSION(S) / ED DIAGNOSES  Final diagnoses:  Acute diarrhea      Gregor Hams, MD 07/25/14 [phone]

## 2014-07-25 NOTE — ED Notes (Signed)
Pt reports she has nausea, vomiting and diarrhea that started about 3 to 4 hours ago. Pt reports she has a colostomy and dehydrates easily when she developed these symptoms. Pt reports other family member with similar sx.

## 2014-07-25 NOTE — ED Notes (Signed)
Pt. Also states cramping in legs intermittently.

## 2014-07-25 NOTE — Discharge Instructions (Signed)

## 2014-07-25 NOTE — ED Notes (Signed)
Pt. States having illiostomy for the past 23 years.  Pt. States she gets dehydrated easy.  Pt. States multiple times vomiting, diarrhea, and nausea.  Pt. States family member had same symptoms the other day.

## 2014-07-26 ENCOUNTER — Inpatient Hospital Stay
Admission: EM | Admit: 2014-07-26 | Discharge: 2014-07-28 | DRG: 683 | Disposition: A | Discharge status: Home / Self Care | Payer: BC Managed Care – PPO | Attending: Internal Medicine | Admitting: Internal Medicine

## 2014-07-26 ENCOUNTER — Encounter: Payer: Self-pay | Admitting: Emergency Medicine

## 2014-07-26 DIAGNOSIS — E669 Obesity, unspecified: Secondary | ICD-10-CM | POA: Diagnosis present

## 2014-07-26 DIAGNOSIS — Z932 Ileostomy status: Secondary | ICD-10-CM

## 2014-07-26 DIAGNOSIS — M62838 Other muscle spasm: Secondary | ICD-10-CM | POA: Diagnosis present

## 2014-07-26 DIAGNOSIS — Z8249 Family history of ischemic heart disease and other diseases of the circulatory system: Secondary | ICD-10-CM

## 2014-07-26 DIAGNOSIS — I10 Essential (primary) hypertension: Secondary | ICD-10-CM | POA: Diagnosis present

## 2014-07-26 DIAGNOSIS — E119 Type 2 diabetes mellitus without complications: Secondary | ICD-10-CM | POA: Diagnosis present

## 2014-07-26 DIAGNOSIS — G4730 Sleep apnea, unspecified: Secondary | ICD-10-CM | POA: Diagnosis present

## 2014-07-26 DIAGNOSIS — K5190 Ulcerative colitis, unspecified, without complications: Secondary | ICD-10-CM | POA: Diagnosis present

## 2014-07-26 DIAGNOSIS — Z9049 Acquired absence of other specified parts of digestive tract: Secondary | ICD-10-CM

## 2014-07-26 DIAGNOSIS — M1990 Unspecified osteoarthritis, unspecified site: Secondary | ICD-10-CM | POA: Diagnosis present

## 2014-07-26 DIAGNOSIS — Z800 Family history of malignant neoplasm of digestive organs: Secondary | ICD-10-CM | POA: Diagnosis not present

## 2014-07-26 DIAGNOSIS — N179 Acute kidney failure, unspecified: Principal | ICD-10-CM | POA: Diagnosis present

## 2014-07-26 DIAGNOSIS — E860 Dehydration: Secondary | ICD-10-CM | POA: Diagnosis present

## 2014-07-26 DIAGNOSIS — E871 Hypo-osmolality and hyponatremia: Secondary | ICD-10-CM | POA: Diagnosis present

## 2014-07-26 DIAGNOSIS — R7309 Other abnormal glucose: Secondary | ICD-10-CM | POA: Diagnosis present

## 2014-07-26 DIAGNOSIS — Z6839 Body mass index (BMI) 39.0-39.9, adult: Secondary | ICD-10-CM

## 2014-07-26 DIAGNOSIS — E1129 Type 2 diabetes mellitus with other diabetic kidney complication: Secondary | ICD-10-CM

## 2014-07-26 DIAGNOSIS — Z833 Family history of diabetes mellitus: Secondary | ICD-10-CM | POA: Diagnosis not present

## 2014-07-26 DIAGNOSIS — L719 Rosacea, unspecified: Secondary | ICD-10-CM | POA: Diagnosis present

## 2014-07-26 DIAGNOSIS — R197 Diarrhea, unspecified: Secondary | ICD-10-CM

## 2014-07-26 LAB — BASIC METABOLIC PANEL
ANION GAP: 13 (ref 5–15)
BUN: 25 mg/dL — AB (ref 6–20)
CO2: 17 mmol/L — AB (ref 22–32)
CREATININE: 1.86 mg/dL — AB (ref 0.44–1.00)
Calcium: 7.9 mg/dL — ABNORMAL LOW (ref 8.9–10.3)
Chloride: 98 mmol/L — ABNORMAL LOW (ref 101–111)
GFR calc Af Amer: 33 mL/min — ABNORMAL LOW (ref >60)
GFR, EST NON AFRICAN AMERICAN: 28 mL/min — AB (ref >60)
Glucose, Bld: 147 mg/dL — ABNORMAL HIGH (ref 65–99)
Potassium: 3.6 mmol/L (ref 3.5–5.1)
Sodium: 128 mmol/L — ABNORMAL LOW (ref 135–145)

## 2014-07-26 LAB — COMPREHENSIVE METABOLIC PANEL
ALT: 60 U/L — ABNORMAL HIGH (ref 14–54)
ANION GAP: 15 (ref 5–15)
AST: 70 U/L — AB (ref 15–41)
Albumin: 4.4 g/dL (ref 3.5–5.0)
Alkaline Phosphatase: 86 U/L (ref 38–126)
BUN: 29 mg/dL — AB (ref 6–20)
CALCIUM: 9.1 mg/dL (ref 8.9–10.3)
CO2: 17 mmol/L — AB (ref 22–32)
Chloride: 91 mmol/L — ABNORMAL LOW (ref 101–111)
Creatinine, Ser: 2.47 mg/dL — ABNORMAL HIGH (ref 0.44–1.00)
GFR calc non Af Amer: 20 mL/min — ABNORMAL LOW (ref >60)
GFR, EST AFRICAN AMERICAN: 23 mL/min — AB (ref >60)
Glucose, Bld: 192 mg/dL — ABNORMAL HIGH (ref 65–99)
Potassium: 4.2 mmol/L (ref 3.5–5.1)
Sodium: 123 mmol/L — ABNORMAL LOW (ref 135–145)
Total Bilirubin: 1 mg/dL (ref 0.3–1.2)
Total Protein: 9.4 g/dL — ABNORMAL HIGH (ref 6.5–8.1)

## 2014-07-26 LAB — C DIFFICILE QUICK SCREEN W PCR REFLEX
C Diff antigen: NEGATIVE
C Diff interpretation: NEGATIVE
C Diff toxin: NEGATIVE

## 2014-07-26 LAB — LIPASE, BLOOD: LIPASE: 32 U/L (ref 22–51)

## 2014-07-26 LAB — PHOSPHORUS: PHOSPHORUS: 3.3 mg/dL (ref 2.5–4.6)

## 2014-07-26 LAB — CK: Total CK: 301 U/L — ABNORMAL HIGH (ref 38–234)

## 2014-07-26 LAB — GLUCOSE, CAPILLARY: Glucose-Capillary: 146 mg/dL — ABNORMAL HIGH (ref 65–99)

## 2014-07-26 LAB — CBC
HCT: 51.3 % — ABNORMAL HIGH (ref 35.0–47.0)
Hemoglobin: 16.9 g/dL — ABNORMAL HIGH (ref 12.0–16.0)
MCH: 30.5 pg (ref 26.0–34.0)
MCHC: 32.9 g/dL (ref 32.0–36.0)
MCV: 92.9 fL (ref 80.0–100.0)
Platelets: 323 10*3/uL (ref 150–440)
RBC: 5.53 MIL/uL — AB (ref 3.80–5.20)
RDW: 13.9 % (ref 11.5–14.5)
WBC: 12.2 10*3/uL — ABNORMAL HIGH (ref 3.6–11.0)

## 2014-07-26 LAB — MAGNESIUM: MAGNESIUM: 1.6 mg/dL — AB (ref 1.7–2.4)

## 2014-07-26 MED ORDER — SODIUM CHLORIDE 0.9 % IV BOLUS (SEPSIS): 1000.0000 mL | Freq: Once | INTRAVENOUS | Status: DC

## 2014-07-26 MED ORDER — INSULIN ASPART 100 UNIT/ML ~~LOC~~ SOLN: 0.0000 [IU] | Freq: Every day | SUBCUTANEOUS | Status: DC

## 2014-07-26 MED ORDER — SODIUM CHLORIDE 0.9 % IV BOLUS (SEPSIS)
1000.0000 mL | Freq: Once | INTRAVENOUS | Status: AC
  Administered 2014-07-26: 1000 mL via INTRAVENOUS

## 2014-07-26 MED ORDER — ONDANSETRON HCL 4 MG PO TABS: 4.0000 mg | ORAL_TABLET | Freq: Four times a day (QID) | ORAL | Status: DC | PRN

## 2014-07-26 MED ORDER — ONDANSETRON HCL 4 MG/2ML IJ SOLN: 4.0000 mg | Freq: Four times a day (QID) | INTRAMUSCULAR | Status: DC | PRN

## 2014-07-26 MED ORDER — HEPARIN SODIUM (PORCINE) 5000 UNIT/ML IJ SOLN
5000.0000 [IU] | Freq: Three times a day (TID) | INTRAMUSCULAR | Status: DC
  Administered 2014-07-26 – 2014-07-27 (×3): 5000 [IU] via SUBCUTANEOUS
  Filled 2014-07-26 (×4): qty 1

## 2014-07-26 MED ORDER — PANTOPRAZOLE SODIUM 40 MG PO TBEC
40.0000 mg | DELAYED_RELEASE_TABLET | Freq: Every day | ORAL | Status: DC
  Administered 2014-07-27 – 2014-07-28 (×2): 40 mg via ORAL
  Filled 2014-07-26 (×2): qty 1

## 2014-07-26 MED ORDER — ACETAMINOPHEN 650 MG RE SUPP: 650.0000 mg | Freq: Four times a day (QID) | RECTAL | Status: DC | PRN

## 2014-07-26 MED ORDER — LORAZEPAM 2 MG/ML IJ SOLN
INTRAMUSCULAR | Status: AC
  Administered 2014-07-26: 0.5 mg via INTRAVENOUS
  Filled 2014-07-26: qty 1

## 2014-07-26 MED ORDER — HYOSCYAMINE SULFATE 0.125 MG SL SUBL
0.1250 mg | SUBLINGUAL_TABLET | SUBLINGUAL | Status: DC | PRN
  Filled 2014-07-26: qty 1

## 2014-07-26 MED ORDER — LORAZEPAM 2 MG/ML IJ SOLN
0.5000 mg | Freq: Once | INTRAMUSCULAR | Status: AC
Start: 2014-07-26 — End: 2014-07-26
  Administered 2014-07-26: 0.5 mg via INTRAVENOUS

## 2014-07-26 MED ORDER — LOPERAMIDE HCL 2 MG PO CAPS
2.0000 mg | ORAL_CAPSULE | Freq: Four times a day (QID) | ORAL | Status: DC | PRN
  Administered 2014-07-27: 2 mg via ORAL
  Filled 2014-07-26: qty 1

## 2014-07-26 MED ORDER — INSULIN ASPART 100 UNIT/ML ~~LOC~~ SOLN
0.0000 [IU] | Freq: Three times a day (TID) | SUBCUTANEOUS | Status: DC
  Administered 2014-07-27 (×2): 1 [IU] via SUBCUTANEOUS
  Administered 2014-07-27: 5 [IU] via SUBCUTANEOUS
  Administered 2014-07-28: 1 [IU] via SUBCUTANEOUS
  Filled 2014-07-26: qty 5
  Filled 2014-07-26 (×3): qty 1

## 2014-07-26 MED ORDER — SODIUM CHLORIDE 0.9 % IV SOLN
INTRAVENOUS | Status: AC
  Administered 2014-07-26 – 2014-07-27 (×2): via INTRAVENOUS

## 2014-07-26 MED ORDER — ACETAMINOPHEN 325 MG PO TABS: 650.0000 mg | ORAL_TABLET | Freq: Four times a day (QID) | ORAL | Status: DC | PRN

## 2014-07-26 MED ORDER — KETOROLAC TROMETHAMINE 30 MG/ML IJ SOLN: 30.0000 mg | Freq: Once | INTRAMUSCULAR | Status: DC

## 2014-07-26 MED ORDER — KETOROLAC TROMETHAMINE 30 MG/ML IJ SOLN
INTRAMUSCULAR | Status: AC
  Filled 2014-07-26: qty 1

## 2014-07-26 MED ORDER — SODIUM CHLORIDE 0.9 % IJ SOLN
3.0000 mL | Freq: Two times a day (BID) | INTRAMUSCULAR | Status: DC
  Administered 2014-07-26 – 2014-07-27 (×2): 3 mL via INTRAVENOUS

## 2014-07-26 NOTE — ED Notes (Signed)
Attempted x2 to stick for IV access.  Was able to get blood work, but not able to use as an IV.

## 2014-07-26 NOTE — H&P (Signed)
[facility] NAME: Jamie [name]    MR#:  [id number]  DATE OF BIRTH:  1955-04-04  DATE OF ADMISSION:  07/26/2014  PRIMARY CARE PHYSICIAN: Loura Pardon, MD   REQUESTING/REFERRING PHYSICIAN: Thomasene Lot  CHIEF COMPLAINT:   Chief Complaint  Patient presents with  . Diarrhea    HISTORY OF PRESENT ILLNESS:  Jamie [name]  is a 59 y.o. female who presents with significant hyponatremia and acute renal failure. Patient states that 3 days ago she began having nausea and vomiting. She came to the ED for the same, and her labs were relatively okay. She responded well to Imodium and antiemetics, and was discharged home. Around that time she began having high volume output from her ileostomy. She has a history of ulcerative colitis with colectomy and ileostomy. She states that she has not noted any blood in the output from her ileostomy, but that has been significantly watery. She states that she had sick contact with a family member early last week who had similar symptoms. About 6 days ago this family member began having nausea and vomiting and GI upset, and then 4-5 days ago she was around his children, caring for them while he was recovering. She states that her contact was limited. She denies any fever or chills, cough or shortness of breath, abdominal pain, rashes or joint pains. She does state that she's had some diffuse muscle cramping for the last 24 hours. On evaluation in the ED her sodium had dropped significantly from yesterday to today from 138-123. Her bicarbonate was also low at 17. And her creatinine rose from 1.5-2.47, and the prior value of 1.5 was up from her baseline of 0.8. Hospitalists were called for admission for acute renal failure, dehydration, hyponatremia.  PAST MEDICAL HISTORY:   Past Medical History  Diagnosis Date  . Gall stones 2008  . Colitis     with colectomy  . HTN (hypertension)   . Obesity   . Bowel  obstruction     repeated (? possible adhesions) neg EGD  . Rosacea   . Hyperglycemia     mild-monitors A1C  . Sleep apnea     CPAP everynight  . Arthritis     generalized    PAST SURGICAL HISTORY:   Past Surgical History  Procedure Laterality Date  . Total abdominal hysterectomy  05/2006    for abscessed ovaries  . Colectomy  1993    has ileostomy  . Examination under anesthesia  12/06/2011    Procedure: EXAM UNDER ANESTHESIA;  Surgeon: Leighton Ruff, MD;  Location: [facility];  Service: General;  Laterality: N/A;  rectal exam under anesthesia, anal dilation, pouchoscopy    . Colostomy revision  12/06/2011    Procedure: COLOSTOMY REVISION;  Surgeon: Leighton Ruff, MD;  Location: [facility];  Service: General;  Laterality: Right;  OSTOMY revision  . Cholecystectomy      SOCIAL HISTORY:   History  Substance Use Topics  . Smoking status: Never Smoker   . Smokeless tobacco: Not on file  . Alcohol Use: No    FAMILY HISTORY:   Family History  Problem Relation Age of Onset  . Cancer Father     colon  . Liver cancer Father     resection secondary to mets  . Hyperlipidemia Father   . Hypertension Father   . Allergies Father   . Ulcerative colitis Father   . Hypertension Mother   .  Hyperlipidemia Mother   . Cirrhosis Mother     NASH  . Diabetes Mother   . Allergies Brother     DRUG ALLERGIES:   Allergies  Allergen Reactions  . Lisinopril     REACTION: felt bad/ stomach hurt/ weak and tired  . Ciprofloxacin Rash    Erythema and pruritis around IV site, erythema and rash along vein    MEDICATIONS AT HOME:   Prior to Admission medications   Medication Sig Start Date End Date Taking? Authorizing Provider  Blood Glucose Monitoring Suppl (GLUCOMETER ELITE CLASSIC) KIT by Does not apply route daily. Check sugar once daily and as needed for DM2 250.0     Historical Provider, MD  CINNAMON PO Take 1,000 mg by mouth daily.    Historical  Provider, MD  glucose blood test strip 1 each by Other route daily. Use daily and as needed for DM@@ 250.0     Historical Provider, MD  hyoscyamine (LEVSIN SL) 0.125 MG SL tablet Take 1 tablet (0.125 mg total) by mouth every 4 (four) hours as needed. For abdominal cramps 12/09/13   Abner Greenspan, MD  ibuprofen (ADVIL,MOTRIN) 200 MG tablet Take 200 mg by mouth every 6 (six) hours as needed.    Historical Provider, MD  metoprolol tartrate (LOPRESSOR) 25 MG tablet Take 1 tablet (25 mg total) by mouth daily. 12/09/13   Abner Greenspan, MD  Multiple Vitamin (MULTIVITAMIN) tablet Take 1 tablet by mouth daily.      Historical Provider, MD  naproxen sodium (ANAPROX) 220 MG tablet Take 220 mg by mouth as needed.    Historical Provider, MD  NON FORMULARY SLEEPS WITH C-PAP    Historical Provider, MD  omeprazole (PRILOSEC) 20 MG capsule Take 20 mg by mouth every other day.     Historical Provider, MD  ondansetron (ZOFRAN ODT) 4 MG disintegrating tablet Take 1 tablet (4 mg total) by mouth every 4 (four) hours as needed for nausea or vomiting. 07/25/14   Gregor Hams, MD    REVIEW OF SYSTEMS:  Review of Systems  Constitutional: Negative for fever, chills, weight loss and malaise/fatigue.  HENT: Negative for ear pain, hearing loss and tinnitus.   Eyes: Negative for blurred vision, double vision, pain and redness.  Respiratory: Negative for cough, hemoptysis and shortness of breath.   Cardiovascular: Negative for chest pain, palpitations, orthopnea and leg swelling.  Gastrointestinal: Positive for nausea, vomiting and diarrhea. Negative for abdominal pain and constipation.  Genitourinary: Negative for dysuria, frequency and hematuria.  Musculoskeletal: Negative for back pain, joint pain and neck pain.       Diffuse cramping, patient describes this as "deep cramping"  Skin:       No acne, rash, or lesions  Neurological: Negative for dizziness, tremors, focal weakness and weakness.  Endo/Heme/Allergies:  Negative for polydipsia. Does not bruise/bleed easily.  Psychiatric/Behavioral: Negative for depression. The patient is not nervous/anxious and does not have insomnia.      VITAL SIGNS:   Filed Vitals:   07/26/14 1326 07/26/14 1600 07/26/14 1630 07/26/14 1700  BP: 104/49 124/52 124/52 101/50  Pulse: 114 85 88 88  Temp: 97.6 F (36.4 C)     TempSrc: Oral     Resp: _0 Height: _1  (1.676 m)     Weight: 110.224 kg (243 lb)     SpO2: 92% 98% 98% 99%   Wt Readings from Last 3 Encounters:  07/26/14 110.224 kg (243 lb)  07/25/14 114.76  kg (253 lb)  06/09/14 114.76 kg (253 lb)    PHYSICAL EXAMINATION:  Physical Exam  Constitutional: She is oriented to person, place, and time. She appears well-developed and well-nourished. No distress.  HENT:  Head: Normocephalic and atraumatic.  Dry mucous membranes  Eyes: Conjunctivae and EOM are normal. Pupils are equal, round, and reactive to light. No scleral icterus.  Neck: Normal range of motion. Neck supple. No JVD present. No thyromegaly present.  Cardiovascular: Normal rate, regular rhythm and intact distal pulses.  Exam reveals no gallop and no friction rub.   No murmur heard. Respiratory: Effort normal and breath sounds normal. No respiratory distress. She has no wheezes. She has no rales.  GI: Soft. Bowel sounds are normal. She exhibits no distension. There is no tenderness.  Ostomy with very watery output  Musculoskeletal: Normal range of motion. She exhibits no edema.  No arthritis, no gout  Lymphadenopathy:    She has no cervical adenopathy.  Neurological: She is alert and oriented to person, place, and time. No cranial nerve deficit.  No dysarthria, no aphasia  Skin: Skin is warm and dry. No rash noted. No erythema.  Psychiatric: She has a normal mood and affect. Her behavior is normal. Judgment and thought content normal.    LABORATORY PANEL:   CBC  Recent Labs Lab 07/26/14 1431  WBC 12.2*  HGB 16.9*  HCT  51.3*  PLT 323   ------------------------------------------------------------------------------------------------------------------  Chemistries   Recent Labs Lab 07/26/14 1431  NA 123*  K 4.2  CL 91*  CO2 17*  GLUCOSE 192*  BUN 29*  CREATININE 2.47*  CALCIUM 9.1  AST 70*  ALT 60*  ALKPHOS 86  BILITOT 1.0   ------------------------------------------------------------------------------------------------------------------  Cardiac Enzymes No results for input(s): TROPONINI in the last 168 hours. ------------------------------------------------------------------------------------------------------------------  RADIOLOGY:  No results found.  EKG:   Orders placed or performed in visit on 01/09/14  . EKG 12-Lead    IMPRESSION AND PLAN:  Principal Problem:   Acute renal failure - due to her significant dehydration from her vomiting and, probably more so, from her significant diarrhea. No recent antibiotic use, C. difficile here was negative. We'll start with aggressive rehydration, and monitor serial labs to watch for improvement in her renal function, avoid nephrotoxins, replace electrolytes appropriately. Active Problems:   Ulcerative colitis - unclear if this is somehow related to her history of ulcerative colitis. She did have a colectomy, and there is been no blood in her stool, though she states she never had blood in her stool in the past year. It is perhaps more likely that this is a viral gastroenteritis, given her recent contact with a sick family member. We'll monitor closely, and consider GI consult if she is not improving.   Hyponatremia - acute, occurring over the last 24 hours. We will rehydrate aggressively, and follow serial labs. We can consider a higher concentration saline if it becomes necessary, meaning of her labs are not correcting.   Dehydration - hydrate aggressively with normal saline for now, follow closely.   Essential hypertension - her blood  pressure is borderline low at this time, which is probably significantly low for her. We will hold her antihypertensives, and hydrate aggressively.   Diabetes type 2, controlled - patient states she is "prediabetic". She is not on any anti-glycemic medication. We will start her off with a clear liquid diet, to be transitioned to a carb controlled diet when she is able eat more solid food. We will also order  sliding scale insulin coverage with routine fingersticks, as her blood sugar is currently elevated.  All the records are reviewed and case discussed with ED provider. Management plans discussed with the patient and/or family.  DVT PROPHYLAXIS: SubQ heparin  ADMISSION STATUS: Inpatient  CODE STATUS: Full  TOTAL TIME TAKING CARE OF THIS PATIENT: 45 minutes.    Nasir Bright FIELDING 07/26/2014, 5:41 PM  [facility]  Office  [phone]  CC: Primary care physician; Loura Pardon, MD

## 2014-07-26 NOTE — ED Notes (Signed)
MD at bedside. 

## 2014-07-26 NOTE — ED Provider Notes (Signed)
[facility] Emergency Department Provider Note  ____________________________________________  Time seen: Approximately 210 PM  I have reviewed the triage vital signs and the nursing notes.   HISTORY  Chief Complaint Diarrhea    HPI Jamie [name] is a 59 y.o. female with a history of ulcerative colitis status post colectomy with ileostomy who presents presenting today with 3 days of worsening diarrhea. Starting Friday she had vomiting with diarrhea. She was seen in the emergency department yesterday and discharged with symptomatically treatment with Imodium. She has continued to have worsening diarrhea, obtaining her bag every one half hours. Denies any blood in the stool. Denies any recent antibiotics or recent hospitalizations. No history of C. difficile in the past. Denies any abdominal pain at this time. Says has not vomited in over 24 hours. Says that since being discharged yesterday she has developed diffuse muscle cramping in her bilateral upper and lower extremities as well as her back.   Past Medical History  Diagnosis Date  . Gall stones 2008  . Colitis     with colectomy  . HTN (hypertension)   . Obesity   . Bowel obstruction     repeated (? possible adhesions) neg EGD  . Rosacea   . Hyperglycemia     mild-monitors A1C  . Sleep apnea     CPAP everynight  . Arthritis     generalized    Patient Active Problem List   Diagnosis Date Noted  . Elevated transaminase level 12/09/2013  . Red blood cell antibody positive 07/02/2013  . Abnormal urine 05/15/2012  . Elevated C-reactive protein (CRP) 05/15/2012  . Stricture of anal canal 11/21/2011  . Rectal discharge 10/02/2011  . Routine general medical examination at a health care facility 09/24/2011  . Apnea 11/14/2010  . Neck pain 11/14/2010  . Nevus of lower leg 11/14/2010  . Other screening mammogram 10/31/2010  . Left sided abdominal pain 09/08/2010  . Stress reaction, emotional  05/02/2010  . Compulsive overeater 05/02/2010  . Arthritis   . Bowel obstruction   . KNEE PAIN 10/14/2009  . HOARSENESS 07/21/2009  . Diabetes type 2, controlled 04/23/2009  . ROSACEA 09/12/2007  . Essential hypertension 05/15/2007  . Obesity 12/13/2006  . OSA (obstructive sleep apnea) 12/13/2006  . COLITIS, ULCERATIVE NOS 07/12/2006    Past Surgical History  Procedure Laterality Date  . Total abdominal hysterectomy  05/2006    for abscessed ovaries  . Colectomy  1993    has ileostomy  . Examination under anesthesia  12/06/2011    Procedure: EXAM UNDER ANESTHESIA;  Surgeon: Leighton Ruff, MD;  [facility];  Service: General;  Laterality: N/A;  rectal exam under anesthesia, anal dilation, pouchoscopy    . Colostomy revision  12/06/2011    Procedure: COLOSTOMY REVISION;  Surgeon: Leighton Ruff, MD;  Location: [facility];  Service: General;  Laterality: Right;  OSTOMY revision  . Cholecystectomy      Current Outpatient Rx  Name  Route  Sig  Dispense  Refill  . Blood Glucose Monitoring Suppl (GLUCOMETER ELITE CLASSIC) KIT   Does not apply   by Does not apply route daily. Check sugar once daily and as needed for DM2 250.0          . CINNAMON PO   Oral   Take 1,000 mg by mouth daily.         Marland Kitchen glucose blood test strip   Other   1 each by Other route  daily. Use daily and as needed for DM@@ 250.0          . hyoscyamine (LEVSIN SL) 0.125 MG SL tablet   Oral   Take 1 tablet (0.125 mg total) by mouth every 4 (four) hours as needed. For abdominal cramps   30 tablet   11   . ibuprofen (ADVIL,MOTRIN) 200 MG tablet   Oral   Take 200 mg by mouth every 6 (six) hours as needed.         . metoprolol tartrate (LOPRESSOR) 25 MG tablet   Oral   Take 1 tablet (25 mg total) by mouth daily.   90 tablet   3   . Multiple Vitamin (MULTIVITAMIN) tablet   Oral   Take 1 tablet by mouth daily.           . naproxen sodium (ANAPROX) 220 MG  tablet   Oral   Take 220 mg by mouth as needed.         . NON FORMULARY      SLEEPS WITH C-PAP         . omeprazole (PRILOSEC) 20 MG capsule   Oral   Take 20 mg by mouth every other day.          . ondansetron (ZOFRAN ODT) 4 MG disintegrating tablet   Oral   Take 1 tablet (4 mg total) by mouth every 4 (four) hours as needed for nausea or vomiting.   20 tablet   0     Allergies Lisinopril and Ciprofloxacin  Family History  Problem Relation Age of Onset  . Cancer Father     colon  . Liver cancer Father     resection secondary to mets  . Hyperlipidemia Father   . Hypertension Father   . Allergies Father   . Ulcerative colitis Father   . Hypertension Mother   . Hyperlipidemia Mother   . Cirrhosis Mother     NASH  . Diabetes Mother   . Allergies Brother     Social History History  Substance Use Topics  . Smoking status: Never Smoker   . Smokeless tobacco: Not on file  . Alcohol Use: No    Review of Systems Constitutional: No fever/chills Eyes: No visual changes. ENT: No sore throat. Cardiovascular: Denies chest pain. Respiratory: Denies shortness of breath. Gastrointestinal: No abdominal pain.  No nausea, no vomiting.   No constipation. Genitourinary: Negative for dysuria. Musculoskeletal: Negative for back pain. Skin: Negative for rash. Neurological: Negative for headaches, focal weakness or numbness.  10-point ROS otherwise negative.  ____________________________________________   PHYSICAL EXAM:  VITAL SIGNS: ED Triage Vitals  Enc Vitals Group     BP 07/26/14 1326 104/49 mmHg     Pulse Rate 07/26/14 1326 114     Resp 07/26/14 1326 20     Temp 07/26/14 1326 97.6 F (36.4 C)     Temp Source 07/26/14 1326 Oral     SpO2 07/26/14 1326 92 %     Weight 07/26/14 1326 243 lb (110.224 kg)     Height 07/26/14 1326 5' 6" (1.676 m)     Head Cir --      Peak Flow --      Pain Score 07/26/14 1330 2     Pain Loc --      Pain Edu? --      Excl.  in [place]? --     Constitutional: Alert and oriented. Mild distress with difficulty finding a position of comfort. Eyes:  Conjunctivae are normal. PERRL. EOMI. Head: Atraumatic. Nose: No congestion/rhinnorhea. Mouth/Throat: Mucous membranes are moist.  Oropharynx non-erythematous. Neck: No stridor.   Cardiovascular: Normal rate, regular rhythm. Grossly normal heart sounds.  Good peripheral circulation. Respiratory: Normal respiratory effort.  No retractions. Lungs CTAB. Gastrointestinal: Soft and nontender. No distention. No CVA tenderness. Ileostomy bag filled with brown, liquid stool. Musculoskeletal: No lower extremity tenderness nor edema.  No joint effusions. Neurologic:  Normal speech and language. No gross focal neurologic deficits are appreciated. Speech is normal. No gait instability. Skin:  Skin is warm, dry and intact. No rash noted. Psychiatric: Mood and affect are normal. Speech and behavior are normal.  ____________________________________________   LABS (all labs ordered are listed, but only abnormal results are displayed)  Labs Reviewed  CBC - Abnormal; Notable for the following:    WBC 12.2 (*)    RBC 5.53 (*)    Hemoglobin 16.9 (*)    HCT 51.3 (*)    All other components within normal limits  COMPREHENSIVE METABOLIC PANEL - Abnormal; Notable for the following:    Sodium 123 (*)    Chloride 91 (*)    CO2 17 (*)    Glucose, Bld 192 (*)    BUN 29 (*)    Creatinine, Ser 2.47 (*)    Total Protein 9.4 (*)    AST 70 (*)    ALT 60 (*)    GFR calc non Af Amer 20 (*)    GFR calc Af Amer 23 (*)    All other components within normal limits  STOOL CULTURE  C DIFFICILE QUICK SCAN W PCR REFLEX ([facility] ONLY)  LIPASE, BLOOD  CK   ____________________________________________  EKG   ____________________________________________  RADIOLOGY   ____________________________________________   PROCEDURES    ____________________________________________   INITIAL  IMPRESSION / ASSESSMENT AND PLAN / ED COURSE  Pertinent labs & imaging results that were available during my care of the patient were reviewed by me and considered in my medical decision making (see chart for details).  ----------------------------------------- 3:28 PM on 07/26/2014 -----------------------------------------  Patient signed out to Dr. Thomasene Lot for further evaluation. Will follow-up with patient's lab results including stool studies. Likely to admit patient. ____________________________________________   FINAL CLINICAL IMPRESSION(S) / ED DIAGNOSES  Acute diarrhea. Acute hyponatremia. Acute renal failure. Return visit.    Orbie Pyo, MD 07/26/14 [phone]

## 2014-07-26 NOTE — ED Notes (Signed)
Family at bedside. 

## 2014-07-26 NOTE — ED Notes (Addendum)
Husband reports pt has had continued diarrhea since Friday. Vomiting stopped Friday and continued diarrhea. Pt with illeostomy  Denies fever. Pt now reports body cramps. Last dose of Immodium at 1am today.

## 2014-07-26 NOTE — ED Provider Notes (Signed)
-----------------------------------------   3:42 PM on 07/26/2014 -----------------------------------------  I accepted signout this patient from Dr. Dineen Kid. Please see his initial H&P for further background in detail.  She has a history of ulcerative colitis and has now had significant diarrhea, with increased output into her ostomy. She is having muscle cramps and spasms, probably due to dehydration.  Blood had been drawn but an IV was not able to be established.  Patient is alert, pleasant, but is having spasms in her feet and some in her hands.  After discussing the options with the patient, she agreed to a ultrasound-guided peripheral IV.  Angiocath insertion Performed by: Ahmed Prima  Consent: Verbal consent obtained. Risks and benefits: risks, benefits and alternatives were discussed Time out: Immediately prior to procedure a "time out" was called to verify the correct patient, procedure, equipment, support staff and site/side marked as required.  Preparation: Patient was prepped and draped in the usual sterile fashion.  Vein Location: Left anterior forearm  Ultrasound Guided  Gauge: 20  Normal blood return and flush without difficulty Patient tolerance: Patient tolerated the procedure well with no immediate complications.   ----------------------------------------- 5:03 PM on 07/26/2014 -----------------------------------------  CBC was reviewed earlier, with white blood cell count of 12 Thousand  and a hemoglobin of 62.9  Metabolic panel shows hyponatremia at 123 with a low CO2 level, 17, glucose of 192, BUN of 29 creatinine of 2.47, LFTs mildly elevated at 70 and 60  AST and ALT respectively.    Reassessment of the patient finds her more comfortable after IV fluids and Ativan. We will admit her to the hospital due to the acute hyponatremia and suspected dehydration with elevated creatinine at 2.47.  Diagnosis: Hyponatremia Dehydration Acute renal  failure Muscle spasms due to hyponatremia and dehydration Diarrhea, acute Ulcerative colitis, chronic    Ahmed Prima, MD 07/26/14 1705

## 2014-07-27 LAB — BASIC METABOLIC PANEL
ANION GAP: 7 (ref 5–15)
Anion gap: 9 (ref 5–15)
BUN: 22 mg/dL — AB (ref 6–20)
BUN: 19 mg/dL (ref 6–20)
CHLORIDE: 104 mmol/L (ref 101–111)
CHLORIDE: 106 mmol/L (ref 101–111)
CO2: 22 mmol/L (ref 22–32)
CO2: 22 mmol/L (ref 22–32)
CREATININE: 1.35 mg/dL — AB (ref 0.44–1.00)
CREATININE: 1.21 mg/dL — AB (ref 0.44–1.00)
Calcium: 8.2 mg/dL — ABNORMAL LOW (ref 8.9–10.3)
Calcium: 8.4 mg/dL — ABNORMAL LOW (ref 8.9–10.3)
GFR calc Af Amer: 55 mL/min — ABNORMAL LOW (ref >60)
GFR calc non Af Amer: 48 mL/min — ABNORMAL LOW (ref >60)
GFR, EST AFRICAN AMERICAN: 48 mL/min — AB (ref >60)
GFR, EST NON AFRICAN AMERICAN: 42 mL/min — AB (ref >60)
Glucose, Bld: 141 mg/dL — ABNORMAL HIGH (ref 65–99)
Glucose, Bld: 147 mg/dL — ABNORMAL HIGH (ref 65–99)
Potassium: 3.7 mmol/L (ref 3.5–5.1)
Potassium: 3.9 mmol/L (ref 3.5–5.1)
SODIUM: 135 mmol/L (ref 135–145)
Sodium: 135 mmol/L (ref 135–145)

## 2014-07-27 LAB — GLUCOSE, CAPILLARY
GLUCOSE-CAPILLARY: 122 mg/dL — AB (ref 65–99)
GLUCOSE-CAPILLARY: 143 mg/dL — AB (ref 65–99)
GLUCOSE-CAPILLARY: 265 mg/dL — AB (ref 65–99)
GLUCOSE-CAPILLARY: 153 mg/dL — AB (ref 65–99)

## 2014-07-27 LAB — CBC
HCT: 45.4 % (ref 35.0–47.0)
Hemoglobin: 14.9 g/dL (ref 12.0–16.0)
MCH: 30.4 pg (ref 26.0–34.0)
MCHC: 32.8 g/dL (ref 32.0–36.0)
MCV: 92.5 fL (ref 80.0–100.0)
Platelets: 229 10*3/uL (ref 150–440)
RBC: 4.91 MIL/uL (ref 3.80–5.20)
RDW: 13.6 % (ref 11.5–14.5)
WBC: 8.7 10*3/uL (ref 3.6–11.0)

## 2014-07-27 LAB — MAGNESIUM: Magnesium: 1.7 mg/dL (ref 1.7–2.4)

## 2014-07-27 NOTE — Progress Notes (Signed)
[place] [facility] [place] NAME: Jamie [name]    MR#:  [id number]  DATE OF BIRTH:  1956-01-22  SUBJECTIVE:  CHIEF COMPLAINT:   Chief Complaint  Patient presents with  . Diarrhea   still diarrhea. But no nausea or vomiting.  REVIEW OF SYSTEMS:  CONSTITUTIONAL: No fever, generalized weakness.  EYES: No blurred or double vision.  EARS, NOSE, AND THROAT: No tinnitus or ear pain.  RESPIRATORY: No cough, shortness of breath, wheezing or hemoptysis.  CARDIOVASCULAR: No chest pain, orthopnea, edema.  GASTROINTESTINAL: No nausea, vomiting, but has diarrhea, no abdominal pain.  GENITOURINARY: No dysuria, hematuria.  ENDOCRINE: No polyuria, nocturia,  HEMATOLOGY: No anemia, easy bruising or bleeding SKIN: No rash or lesion. MUSCULOSKELETAL: No joint pain or arthritis.   NEUROLOGIC: No tingling, numbness, weakness.  PSYCHIATRY: No anxiety or depression.   DRUG ALLERGIES:   Allergies  Allergen Reactions  . Lisinopril     REACTION: felt bad/ stomach hurt/ weak and tired  . Ciprofloxacin Rash    Erythema and pruritis around IV site, erythema and rash along vein    VITALS:  Blood pressure 111/42, pulse 80, temperature 98.2 F (36.8 C), temperature source Oral, resp. rate 18, height 5' 6"  (1.676 m), weight 110.224 kg (243 lb), SpO2 97 %.  PHYSICAL EXAMINATION:  GENERAL:  59 y.o.-year-old patient lying in the bed with no acute distress.  EYES: Pupils equal, round, reactive to light and accommodation. No scleral icterus. Extraocular muscles intact.  HEENT: Head atraumatic, normocephalic. Oropharynx and nasopharynx clear.  NECK:  Supple, no jugular venous distention. No thyroid enlargement, no tenderness.  LUNGS: Normal breath sounds bilaterally, no wheezing, rales,rhonchi or crepitation. No use of accessory muscles of respiration.  CARDIOVASCULAR: S1, S2 normal. No murmurs, rubs, or gallops.  ABDOMEN: Soft, nontender, nondistended. Bowel  sounds present. No organomegaly or mass. Watery stool in colectomy bag.  EXTREMITIES: No pedal edema, cyanosis, or clubbing.  NEUROLOGIC: Cranial nerves II through XII are intact. Muscle strength 5/5 in all extremities. Sensation intact. Gait not checked.  PSYCHIATRIC: The patient is alert and oriented x 3.  SKIN: No obvious rash, lesion, or ulcer.    LABORATORY PANEL:   CBC  Recent Labs Lab 07/27/14 0216  WBC 8.7  HGB 14.9  HCT 45.4  PLT 229   ------------------------------------------------------------------------------------------------------------------  Chemistries   Recent Labs Lab 07/26/14 1431 07/26/14 2000  07/27/14 0840  NA 123* 128*  < > 135  K 4.2 3.6  < > 3.9  CL 91* 98*  < > 106  CO2 17* 17*  < > 22  GLUCOSE 192* 147*  < > 147*  BUN 29* 25*  < > 19  CREATININE 2.47* 1.86*  < > 1.21*  CALCIUM 9.1 7.9*  < > 8.4*  MG  --  1.6*  --   --   AST 70*  --   --   --   ALT 60*  --   --   --   ALKPHOS 86  --   --   --   BILITOT 1.0  --   --   --   < > = values in this interval not displayed. ------------------------------------------------------------------------------------------------------------------  Cardiac Enzymes No results for input(s): TROPONINI in the last 168 hours. ------------------------------------------------------------------------------------------------------------------  RADIOLOGY:  No results found.  EKG:   Orders placed or performed in visit on 01/09/14  . EKG 12-Lead    ASSESSMENT AND PLAN:   Acute renal failure with dehydration -  due to her significant dehydration. Improving on IVF. F/u BMP in am.  Hyponatremia. Improved.  Acute gastroenteritis.  Still diarrhea, but no nausea or vomiting.  C. difficile was negative. Imodium prn.  Ulcerative colitis. Stable.   Essential hypertension - her blood pressure is borderline low at this time, hold her HTN meds.   Diabetes type 2, controlled      All the records are  reviewed and case discussed with Care Management/Social Workerr. Management plans discussed with the patient, family and they are in agreement.  CODE STATUS: Full code.  TOTAL TIME TAKING CARE OF THIS PATIENT: 36 minutes.   POSSIBLE D/C IN 1-2 DAYS, DEPENDING ON CLINICAL CONDITION.   Demetrios Loll M.D on 07/27/2014 at 10:38 AM  Between 7am to 6pm - Pager - [phone]  After 6pm go to [email or web address] - password [email or web address] [facility]  [facility]  Office  [phone]  CC: Primary care physician; Loura Pardon, MD

## 2014-07-27 NOTE — Progress Notes (Signed)
Patient rested during evening. Denies pain on assessment. No nausea/vomiting noted. Tele-SR. No acute distress, will cont to monitor.

## 2014-07-27 NOTE — Progress Notes (Signed)
Initial Nutrition Assessment  INTERVENTION:  Meals and Snacks: Cater to patient preferences as pt diet order just advanced    NUTRITION DIAGNOSIS:  Inadequate oral intake related to altered GI function as evidenced by  (Pt on CL since admission).  GOAL:  Patient will meet greater than or equal to 90% of their needs  MONITOR:   (Energy Intake, Digestive system, Electrolyte and renal Profile)  REASON FOR ASSESSMENT:   (RD Screen, diagnosis)    ASSESSMENT:  Pt admitted with diarrhea and acute renal failure with hyponatremia. Pt with h/o ulcerative colitis with colectomy and ileostomy. Pt on CL this am PMHx:  Past Medical History  Diagnosis Date  . Gall stones 2008  . Colitis     with colectomy  . HTN (hypertension)   . Obesity   . Bowel obstruction     repeated (? possible adhesions) neg EGD  . Rosacea   . Hyperglycemia     mild-monitors A1C  . Sleep apnea     CPAP everynight  . Arthritis     generalized    Diet Order:  Diet heart healthy/carb modified Room service appropriate?: Yes; Fluid consistency:: Thin  Current Nutrition: Pt ate 100% of CL this am and was sipping on Gatorade on visit this am.  Food/Nutrition-Related History: Pt reports healthy appetite PTA until Friday night when altered GI function began. Pt reports tolerating baked potato and applesauce yesterday and tolerated well.   Medications: Novolog, Protonix  Electrolyte/Renal Profile and Glucose Profile:   Recent Labs Lab 07/26/14 2000 07/27/14 0216 07/27/14 0840 07/27/14 1129  NA 128* 135 135  --   K 3.6 3.7 3.9  --   CL 98* 104 106  --   CO2 17* 22 22  --   BUN 25* 22* 19  --   CREATININE 1.86* 1.35* 1.21*  --   CALCIUM 7.9* 8.2* 8.4*  --   MG 1.6*  --   --  1.7  PHOS 3.3  --   --   --   GLUCOSE 147* 141* 147*  --    Protein Profile:  Recent Labs Lab 07/25/14 0301 07/26/14 1431  ALBUMIN 4.3 4.4    Gastrointestinal Profile: Last BM: loose stool via ileostomy this am,  c.diff negative   Weight Change: Pt reports 10lbs weight discrepancy from her scale at home on Friday compared to scale here on admission. Anthropometrics:   Height:  Ht Readings from Last 1 Encounters:  07/26/14 5' 6"  (1.676 m)    Weight:  Wt Readings from Last 1 Encounters:  07/26/14 243 lb (110.224 kg)    Wt Readings from Last 10 Encounters:  07/26/14 243 lb (110.224 kg)  07/25/14 253 lb (114.76 kg)  06/09/14 253 lb (114.76 kg)  12/19/13 248 lb 8 oz (112.719 kg)  12/09/13 247 lb 8 oz (112.265 kg)  10/29/13 254 lb (115.214 kg)  07/02/13 246 lb (111.585 kg)  04/02/13 237 lb 8 oz (107.729 kg)  10/23/12 244 lb 3.2 oz (110.768 kg)  10/02/12 241 lb 8 oz (109.544 kg)    BMI:  Body mass index is 39.24 kg/(m^2).  Skin:  Reviewed, no issues  EDUCATION NEEDS:  No education needs identified at this time    [place], RD, LDN Pager [phone]

## 2014-07-28 LAB — BASIC METABOLIC PANEL
Anion gap: 9 (ref 5–15)
BUN: 18 mg/dL (ref 6–20)
CHLORIDE: 104 mmol/L (ref 101–111)
CO2: 21 mmol/L — ABNORMAL LOW (ref 22–32)
Calcium: 9 mg/dL (ref 8.9–10.3)
Creatinine, Ser: 1.17 mg/dL — ABNORMAL HIGH (ref 0.44–1.00)
GFR calc Af Amer: 57 mL/min — ABNORMAL LOW (ref >60)
GFR calc non Af Amer: 50 mL/min — ABNORMAL LOW (ref >60)
GLUCOSE: 160 mg/dL — AB (ref 65–99)
POTASSIUM: 3.6 mmol/L (ref 3.5–5.1)
Sodium: 134 mmol/L — ABNORMAL LOW (ref 135–145)

## 2014-07-28 LAB — GLUCOSE, CAPILLARY: Glucose-Capillary: 150 mg/dL — ABNORMAL HIGH (ref 65–99)

## 2014-07-28 NOTE — Progress Notes (Signed)
Discharge instructions reviewed with the pt.  Pt did not have any questions.  Pt sent out via wheelchair to her waiting car

## 2014-07-28 NOTE — Discharge Summary (Signed)
[place] at [place] NAME: Jamie [name]    MR#:  [id number]  DATE OF BIRTH:  03/08/1955  DATE OF ADMISSION:  07/26/2014 ADMITTING PHYSICIAN: Lance Coon, MD  DATE OF DISCHARGE: 07/28/2014  9:49 AM  PRIMARY CARE PHYSICIAN: Loura Pardon, MD    ADMISSION DIAGNOSIS:  Diarrhea [R19.7] Hyponatremia [E87.1] Acute renal failure, unspecified acute renal failure type [N17.9]   DISCHARGE DIAGNOSIS:  Acute renal failure with dehydration Hyponatremia. Acute gastroenteritis.   SECONDARY DIAGNOSIS:   Past Medical History  Diagnosis Date  . Gall stones 2008  . Colitis     with colectomy  . HTN (hypertension)   . Obesity   . Bowel obstruction     repeated (? possible adhesions) neg EGD  . Rosacea   . Hyperglycemia     mild-monitors A1C  . Sleep apnea     CPAP everynight  . Arthritis     generalized    HOSPITAL COURSE:   Acute renal failure with dehydration - due to her significant dehydration. Improved with treatment of IVF.  Hyponatremia. Improved.  Acute gastroenteritis.  Better diarrhea. C. difficile was negative. Continue Imodium prn.  Ulcerative colitis. Stable.  Essential hypertension. Her HTN medications are on hold due to low side blood pressure, may resume after discharge if BP allow.  DISCHARGE CONDITIONS:   Stable.  CONSULTS OBTAINED:     DRUG ALLERGIES:   Allergies  Allergen Reactions  . Lisinopril     REACTION: felt bad/ stomach hurt/ weak and tired  . Ciprofloxacin Rash    Erythema and pruritis around IV site, erythema and rash along vein    DISCHARGE MEDICATIONS:   Discharge Medication List as of 07/28/2014  8:10 AM    CONTINUE these medications which have NOT CHANGED   Details  Blood Glucose Monitoring Suppl (GLUCOMETER ELITE CLASSIC) KIT by Does not apply route daily. Check sugar once daily and as needed for DM2 250.0 , Until Discontinued, Historical Med    CINNAMON PO Take 1,000 mg by  mouth daily., Until Discontinued, Historical Med    glucose blood test strip 1 each by Other route daily. Use daily and as needed for DM@@ 250.0 , Historical Med    hyoscyamine (LEVSIN SL) 0.125 MG SL tablet Take 1 tablet (0.125 mg total) by mouth every 4 (four) hours as needed. For abdominal cramps, Starting 12/09/2013, Until Discontinued, Normal    ibuprofen (ADVIL,MOTRIN) 200 MG tablet Take 200 mg by mouth every 6 (six) hours as needed (for arthritis). , Until Discontinued, Historical Med    metoprolol tartrate (LOPRESSOR) 25 MG tablet Take 1 tablet (25 mg total) by mouth daily., Starting 12/09/2013, Until Discontinued, Normal    Multiple Vitamin (MULTIVITAMIN) tablet Take 1 tablet by mouth daily.  , Until Discontinued, Historical Med    naproxen sodium (ANAPROX) 220 MG tablet Take 220 mg by mouth daily. , Until Discontinued, Historical Med    NON FORMULARY SLEEPS WITH C-PAP, Until Discontinued, Historical Med    omeprazole (PRILOSEC) 20 MG capsule Take 20 mg by mouth every other day. , Until Discontinued, Historical Med    ondansetron (ZOFRAN ODT) 4 MG disintegrating tablet Take 1 tablet (4 mg total) by mouth every 4 (four) hours as needed for nausea or vomiting., Starting 07/25/2014, Until Discontinued, Print         DISCHARGE INSTRUCTIONS:    If you experience worsening of your admission symptoms, develop shortness of breath, life threatening emergency, suicidal or homicidal  thoughts you must seek medical attention immediately by calling 911 or calling your MD immediately  if symptoms less severe.  You Must read complete instructions/literature along with all the possible adverse reactions/side effects for all the Medicines you take and that have been prescribed to you. Take any new Medicines after you have completely understood and accept all the possible adverse reactions/side effects.   Please note  You were cared for by a hospitalist during your hospital stay. If you have any  questions about your discharge medications or the care you received while you were in the hospital after you are discharged, you can call the unit and asked to speak with the hospitalist on call if the hospitalist that took care of you is not available. Once you are discharged, your primary care physician will handle any further medical issues. Please note that NO REFILLS for any discharge medications will be authorized once you are discharged, as it is imperative that you return to your primary care physician (or establish a relationship with a primary care physician if you do not have one) for your aftercare needs so that they can reassess your need for medications and monitor your lab values.    Today   SUBJECTIVE    No complaint.  VITAL SIGNS:  Blood pressure 111/40, pulse 81, temperature 98.1 F (36.7 C), temperature source Oral, resp. rate 18, height 5' 6"  (1.676 m), weight 110.224 kg (243 lb), SpO2 96 %.  I/O:   Intake/Output Summary (Last 24 hours) at 07/28/14 1200 Last data filed at 07/28/14 0719  Gross per 24 hour  Intake    240 ml  Output   2300 ml  Net  -2060 ml    PHYSICAL EXAMINATION:  GENERAL:  59 y.o.-year-old patient lying in the bed with no acute distress.  EYES: Pupils equal, round, reactive to light and accommodation. No scleral icterus. Extraocular muscles intact.  HEENT: Head atraumatic, normocephalic. Oropharynx and nasopharynx clear.  NECK:  Supple, no jugular venous distention. No thyroid enlargement, no tenderness.  LUNGS: Normal breath sounds bilaterally, no wheezing, rales,rhonchi or crepitation. No use of accessory muscles of respiration.  CARDIOVASCULAR: S1, S2 normal. No murmurs, rubs, or gallops.  ABDOMEN: Soft, non-tender, non-distended. Bowel sounds present. No organomegaly or mass. Watery stool in colectomy bag.  EXTREMITIES: No pedal edema, cyanosis, or clubbing.  NEUROLOGIC: Cranial nerves II through XII are intact. Muscle strength 5/5 in all  extremities. Sensation intact. Gait not checked.  PSYCHIATRIC: The patient is alert and oriented x 3.  SKIN: No obvious rash, lesion, or ulcer.   DATA REVIEW:   CBC  Recent Labs Lab 07/27/14 0216  WBC 8.7  HGB 14.9  HCT 45.4  PLT 229    Chemistries   Recent Labs Lab 07/26/14 1431  07/27/14 1129 07/28/14 0653  NA 123*  < >  --  134*  K 4.2  < >  --  3.6  CL 91*  < >  --  104  CO2 17*  < >  --  21*  GLUCOSE 192*  < >  --  160*  BUN 29*  < >  --  18  CREATININE 2.47*  < >  --  1.17*  CALCIUM 9.1  < >  --  9.0  MG  --   < > 1.7  --   AST 70*  --   --   --   ALT 60*  --   --   --   ALKPHOS 86  --   --   --  BILITOT 1.0  --   --   --   < > = values in this interval not displayed.  Cardiac Enzymes No results for input(s): TROPONINI in the last 168 hours.  Microbiology Results  Results for orders placed or performed during the hospital encounter of 07/26/14  C difficile quick scan w PCR reflex ([place] only)     Status: None   Collection Time: 07/26/14  2:32 PM  Result Value Ref Range Status   C Diff antigen NEGATIVE  Final   C Diff toxin NEGATIVE  Final   C Diff interpretation Negative for C. difficile  Final    RADIOLOGY:  No results found.      Management plans discussed with the patient, family and they are in agreement.  CODE STATUS:     Code Status Orders        Start     Ordered   07/26/14 1827  Full code   Continuous     07/26/14 1826    Advance Directive Documentation        Most Recent Value   Type of Advance Directive  Living will, Healthcare Power of Attorney   Pre-existing out of facility DNR order (yellow form or pink MOST form)     "MOST" Form in Place?        TOTAL TIME TAKING CARE OF THIS PATIENT: 33 minutes.    Demetrios Loll M.D on 07/28/2014 at 12:00 PM  Between 7am to 6pm - Pager - [phone]  After 6pm go to [email or web address] - password [email or web address] [facility]  [facility]  Office  [phone]  CC: Primary care  physician; Loura Pardon, MD

## 2014-07-28 NOTE — Progress Notes (Signed)
MD making rounds.  Pt is on telemetry.  Stool culture pending.  MD said to discontinue both

## 2014-07-28 NOTE — Progress Notes (Signed)
Pt rested well during evening. Denied pain on assessment. No n/v noted. Tele-SR. Ileostomy care by patient. No acute distress noted, will cont to monitor.

## 2014-07-28 NOTE — Discharge Instructions (Signed)
Low sodium, low fat and ADA diet. Activity as tolerated.

## 2014-08-03 ENCOUNTER — Encounter: Payer: Self-pay | Admitting: Family Medicine

## 2014-08-03 ENCOUNTER — Ambulatory Visit (INDEPENDENT_AMBULATORY_CARE_PROVIDER_SITE_OTHER): Payer: BC Managed Care – PPO | Admitting: Family Medicine

## 2014-08-03 VITALS — BP 128/70 | HR 78 | Temp 98.1°F | Ht 65.5 in | Wt 244.8 lb

## 2014-08-03 DIAGNOSIS — I10 Essential (primary) hypertension: Secondary | ICD-10-CM

## 2014-08-03 DIAGNOSIS — E860 Dehydration: Secondary | ICD-10-CM

## 2014-08-03 DIAGNOSIS — E871 Hypo-osmolality and hyponatremia: Secondary | ICD-10-CM

## 2014-08-03 LAB — COMPREHENSIVE METABOLIC PANEL
ALT: 39 U/L — ABNORMAL HIGH (ref 0–35)
AST: 53 U/L — AB (ref 0–37)
Albumin: 3.7 g/dL (ref 3.5–5.2)
Alkaline Phosphatase: 77 U/L (ref 39–117)
BILIRUBIN TOTAL: 0.4 mg/dL (ref 0.2–1.2)
BUN: 15 mg/dL (ref 6–23)
CO2: 21 mEq/L (ref 19–32)
Calcium: 9.6 mg/dL (ref 8.4–10.5)
Chloride: 102 mEq/L (ref 96–112)
Creatinine, Ser: 1.1 mg/dL (ref 0.40–1.20)
GFR: 53.82 mL/min — AB (ref >60.00)
Glucose, Bld: 105 mg/dL — ABNORMAL HIGH (ref 70–99)
Potassium: 4.7 mEq/L (ref 3.5–5.1)
SODIUM: 134 meq/L — AB (ref 135–145)
Total Protein: 7.2 g/dL (ref 6.0–8.3)

## 2014-08-03 MED ORDER — ZOLPIDEM TARTRATE 10 MG PO TABS: 5.0000 mg | ORAL_TABLET | Freq: Every evening | ORAL | Status: DC | PRN

## 2014-08-03 NOTE — Assessment & Plan Note (Signed)
When dehydrated from gastroenteritis Imp with IVF in hosp  Re check today  No longer cramping

## 2014-08-03 NOTE — Assessment & Plan Note (Signed)
From gastroenteritis in hospital  Now clinically better  Rev lab from d/c  Re check renal prof Did have renal failure that corrected with fluid repletion  Reassuring exam today  Rev hosp records and studies in detail with pt and family today  F/u if any symptoms return

## 2014-08-03 NOTE — Progress Notes (Signed)
Subjective:    Patient ID: Jamie [name], female    DOB: 08/20/1955, 59 y.o.   MRN: [id number]  HPI Here for f/u   hosp 7/3-7/5 with gastroenteritis causing diarrhea/vomiting  and dehydration and then renal failure  C diff neg  Dx with viral gastroenteritis , tx with zofran  Low Na - improved with IVF  Renal failure-imp with IVF as well   Admission on 07/26/2014, Discharged on 07/28/2014  Component Date Value Ref Range Status  . WBC 07/26/2014 12.2* 3.6 - 11.0 K/uL Final  . RBC 07/26/2014 5.53* 3.80 - 5.20 MIL/uL Final  . Hemoglobin 07/26/2014 16.9* 12.0 - 16.0 g/dL Final  . HCT 07/26/2014 51.3* 35.0 - 47.0 % Final  . MCV 07/26/2014 92.9  80.0 - 100.0 fL Final  . MCH 07/26/2014 30.5  26.0 - 34.0 pg Final  . MCHC 07/26/2014 32.9  32.0 - 36.0 g/dL Final  . RDW 07/26/2014 13.9  11.5 - 14.5 % Final  . Platelets 07/26/2014 323  150 - 440 K/uL Final  . Sodium 07/26/2014 123* 135 - 145 mmol/L Final  . Potassium 07/26/2014 4.2  3.5 - 5.1 mmol/L Final   HEMOLYSIS AT THIS LEVEL MAY AFFECT RESULT  . Chloride 07/26/2014 91* 101 - 111 mmol/L Final  . CO2 07/26/2014 17* 22 - 32 mmol/L Final  . Glucose, Bld 07/26/2014 192* 65 - 99 mg/dL Final  . BUN 07/26/2014 29* 6 - 20 mg/dL Final  . Creatinine, Ser 07/26/2014 2.47* 0.44 - 1.00 mg/dL Final  . Calcium 07/26/2014 9.1  8.9 - 10.3 mg/dL Final  . Total Protein 07/26/2014 9.4* 6.5 - 8.1 g/dL Final  . Albumin 07/26/2014 4.4  3.5 - 5.0 g/dL Final  . AST 07/26/2014 70* 15 - 41 U/L Final  . ALT 07/26/2014 60* 14 - 54 U/L Final  . Alkaline Phosphatase 07/26/2014 86  38 - 126 U/L Final  . Total Bilirubin 07/26/2014 1.0  0.3 - 1.2 mg/dL Final  . GFR calc non Af Amer 07/26/2014 20* >60 mL/min Final  . GFR calc Af Amer 07/26/2014 23* >60 mL/min Final   Comment: (NOTE) The eGFR has been calculated using the CKD EPI equation. This calculation has not been validated in all clinical situations. eGFR's persistently <60 mL/min signify possible Chronic  Kidney Disease.   . Anion gap 07/26/2014 15  5 - 15 Final  . Lipase 07/26/2014 32  22 - 51 U/L Final  . C Diff antigen 07/26/2014 NEGATIVE   Final  . C Diff toxin 07/26/2014 NEGATIVE   Final  . C Diff interpretation 07/26/2014 Negative for C. difficile   Final  . Total CK 07/26/2014 301* 38 - 234 U/L Final  . Sodium 07/26/2014 128* 135 - 145 mmol/L Final  . Potassium 07/26/2014 3.6  3.5 - 5.1 mmol/L Final  . Chloride 07/26/2014 98* 101 - 111 mmol/L Final  . CO2 07/26/2014 17* 22 - 32 mmol/L Final  . Glucose, Bld 07/26/2014 147* 65 - 99 mg/dL Final  . BUN 07/26/2014 25* 6 - 20 mg/dL Final  . Creatinine, Ser 07/26/2014 1.86* 0.44 - 1.00 mg/dL Final  . Calcium 07/26/2014 7.9* 8.9 - 10.3 mg/dL Final  . GFR calc non Af Amer 07/26/2014 28* >60 mL/min Final  . GFR calc Af Amer 07/26/2014 33* >60 mL/min Final   Comment: (NOTE) The eGFR has been calculated using the CKD EPI equation. This calculation has not been validated in all clinical situations. eGFR's persistently <60 mL/min signify possible Chronic Kidney  Disease.   . Anion gap 07/26/2014 13  5 - 15 Final  . Sodium 07/27/2014 135  135 - 145 mmol/L Final  . Potassium 07/27/2014 3.7  3.5 - 5.1 mmol/L Final  . Chloride 07/27/2014 104  101 - 111 mmol/L Final  . CO2 07/27/2014 22  22 - 32 mmol/L Final  . Glucose, Bld 07/27/2014 141* 65 - 99 mg/dL Final  . BUN 07/27/2014 22* 6 - 20 mg/dL Final  . Creatinine, Ser 07/27/2014 1.35* 0.44 - 1.00 mg/dL Final  . Calcium 07/27/2014 8.2* 8.9 - 10.3 mg/dL Final  . GFR calc non Af Amer 07/27/2014 42* >60 mL/min Final  . GFR calc Af Amer 07/27/2014 48* >60 mL/min Final   Comment: (NOTE) The eGFR has been calculated using the CKD EPI equation. This calculation has not been validated in all clinical situations. eGFR's persistently <60 mL/min signify possible Chronic Kidney Disease.   . Anion gap 07/27/2014 9  5 - 15 Final  . Magnesium 07/26/2014 1.6* 1.7 - 2.4 mg/dL Final  . Phosphorus  07/26/2014 3.3  2.5 - 4.6 mg/dL Final  . WBC 07/27/2014 8.7  3.6 - 11.0 K/uL Final  . RBC 07/27/2014 4.91  3.80 - 5.20 MIL/uL Final  . Hemoglobin 07/27/2014 14.9  12.0 - 16.0 g/dL Final  . HCT 07/27/2014 45.4  35.0 - 47.0 % Final  . MCV 07/27/2014 92.5  80.0 - 100.0 fL Final  . MCH 07/27/2014 30.4  26.0 - 34.0 pg Final  . MCHC 07/27/2014 32.8  32.0 - 36.0 g/dL Final  . RDW 07/27/2014 13.6  11.5 - 14.5 % Final  . Platelets 07/27/2014 229  150 - 440 K/uL Final  . Sodium 07/27/2014 135  135 - 145 mmol/L Final  . Potassium 07/27/2014 3.9  3.5 - 5.1 mmol/L Final  . Chloride 07/27/2014 106  101 - 111 mmol/L Final  . CO2 07/27/2014 22  22 - 32 mmol/L Final  . Glucose, Bld 07/27/2014 147* 65 - 99 mg/dL Final  . BUN 07/27/2014 19  6 - 20 mg/dL Final  . Creatinine, Ser 07/27/2014 1.21* 0.44 - 1.00 mg/dL Final  . Calcium 07/27/2014 8.4* 8.9 - 10.3 mg/dL Final  . GFR calc non Af Amer 07/27/2014 48* >60 mL/min Final  . GFR calc Af Amer 07/27/2014 55* >60 mL/min Final   Comment: (NOTE) The eGFR has been calculated using the CKD EPI equation. This calculation has not been validated in all clinical situations. eGFR's persistently <60 mL/min signify possible Chronic Kidney Disease.   . Anion gap 07/27/2014 7  5 - 15 Final  . Glucose-Capillary 07/26/2014 146* 65 - 99 mg/dL Final  . Glucose-Capillary 07/27/2014 122* 65 - 99 mg/dL Final  . Comment 1 07/27/2014 Notify RN   Final  . Magnesium 07/27/2014 1.7  1.7 - 2.4 mg/dL Final  . Glucose-Capillary 07/27/2014 143* 65 - 99 mg/dL Final  . Comment 1 07/27/2014 Notify RN   Final  . Sodium 07/28/2014 134* 135 - 145 mmol/L Final  . Potassium 07/28/2014 3.6  3.5 - 5.1 mmol/L Final  . Chloride 07/28/2014 104  101 - 111 mmol/L Final  . CO2 07/28/2014 21* 22 - 32 mmol/L Final  . Glucose, Bld 07/28/2014 160* 65 - 99 mg/dL Final  . BUN 07/28/2014 18  6 - 20 mg/dL Final  . Creatinine, Ser 07/28/2014 1.17* 0.44 - 1.00 mg/dL Final  . Calcium 07/28/2014 9.0  8.9  - 10.3 mg/dL Final  . GFR calc non Af Amer 07/28/2014 50* >  60 mL/min Final  . GFR calc Af Amer 07/28/2014 57* >60 mL/min Final   Comment: (NOTE) The eGFR has been calculated using the CKD EPI equation. This calculation has not been validated in all clinical situations. eGFR's persistently <60 mL/min signify possible Chronic Kidney Disease.   . Anion gap 07/28/2014 9  5 - 15 Final  . Glucose-Capillary 07/27/2014 265* 65 - 99 mg/dL Final  . Comment 1 07/27/2014 Notify RN   Final  . Glucose-Capillary 07/27/2014 153* 65 - 99 mg/dL Final  . Comment 1 07/27/2014 Notify RN   Final  . Glucose-Capillary 07/28/2014 150* 65 - 99 mg/dL Final  . Comment 1 07/28/2014 Notify RN   Final     She is improved now - still a little tired  bp is back up  BP Readings from Last 3 Encounters:  08/03/14 140/78  07/28/14 111/40  07/25/14 139/75     Muscle cramps are better  Appetite is back  No more diarrhea or nausea       Review of Systems Review of Systems  Constitutional: Negative for fever, appetite change,  and unexpected weight change. Pos for some fatigue that is improving  Eyes: Negative for pain and visual disturbance.  Respiratory: Negative for cough and shortness of breath.   Cardiovascular: Negative for cp or palpitations    Gastrointestinal: Negative for nausea, diarrhea and constipation.  Genitourinary: Negative for urgency and frequency.  Skin: Negative for pallor or rash   Neurological: Negative for weakness, light-headedness, numbness and headaches.  Hematological: Negative for adenopathy. Does not bruise/bleed easily.  Psychiatric/Behavioral: Negative for dysphoric mood. The patient is not nervous/anxious.         Objective:   Physical Exam  Constitutional: She appears well-developed and well-nourished. No distress.  obese and well appearing   HENT:  Head: Normocephalic and atraumatic.  Mouth/Throat: Oropharynx is clear and moist.  Eyes: Conjunctivae and EOM are  normal. Pupils are equal, round, and reactive to light.  Neck: Normal range of motion. Neck supple. No JVD present. Carotid bruit is not present. No thyromegaly present.  Cardiovascular: Normal rate, regular rhythm, normal heart sounds and intact distal pulses.  Exam reveals no gallop.   Pulmonary/Chest: Effort normal and breath sounds normal. No respiratory distress. She has no wheezes. She has no rales.  No crackles  Abdominal: Soft. Bowel sounds are normal. She exhibits no distension, no abdominal bruit and no mass. There is no hepatosplenomegaly. There is no tenderness. There is no rigidity, no guarding, no CVA tenderness, no tenderness at McBurney's point and negative Murphy's sign.  Nl appearing colostomy site    Musculoskeletal: She exhibits no edema.  Lymphadenopathy:    She has no cervical adenopathy.  Neurological: She is alert. She has normal reflexes.  Skin: Skin is warm and dry. No rash noted.  Nl skin color and turgor  Brisk capillary refill   Psychiatric: She has a normal mood and affect.          Assessment & Plan:   Problem List Items Addressed This Visit    Dehydration    From gastroenteritis in hospital  Now clinically better  Rev lab from d/c  Re check renal prof Did have renal failure that corrected with fluid repletion  Reassuring exam today  Rev hosp records and studies in detail with pt and family today  F/u if any symptoms return        Relevant Orders   Comprehensive metabolic panel (Completed)   Essential hypertension -  Primary    Pt had hypotension when dehydrated in hospital-now back to normal  Will start back on her lopressor and update [place] if any problems or symptoms of low bp      Relevant Orders   Comprehensive metabolic panel (Completed)   Hyponatremia    When dehydrated from gastroenteritis Imp with IVF in hosp  Re check today  No longer cramping        Relevant Orders   Comprehensive metabolic panel (Completed)

## 2014-08-03 NOTE — Patient Instructions (Signed)
Lab today for electrolytes and kidney function  Gradually get back to regular activity  Drink fluids  Start back on BP medicine - update me if you get light headed

## 2014-08-03 NOTE — Progress Notes (Signed)
Pre visit review using our clinic review tool, if applicable. No additional management support is needed unless otherwise documented below in the visit note. 

## 2014-08-03 NOTE — Assessment & Plan Note (Signed)
Pt had hypotension when dehydrated in hospital-now back to normal  Will start back on her lopressor and update [place] if any problems or symptoms of low bp

## 2014-08-04 ENCOUNTER — Ambulatory Visit: Payer: BC Managed Care – PPO | Admitting: Family Medicine

## 2014-08-07 LAB — HM DIABETES EYE EXAM

## 2014-10-02 ENCOUNTER — Ambulatory Visit (INDEPENDENT_AMBULATORY_CARE_PROVIDER_SITE_OTHER): Payer: BC Managed Care – PPO | Admitting: Family Medicine

## 2014-10-02 ENCOUNTER — Encounter: Payer: Self-pay | Admitting: Family Medicine

## 2014-10-02 VITALS — BP 140/78 | HR 84 | Temp 98.2°F | Ht 65.5 in | Wt 247.5 lb

## 2014-10-02 DIAGNOSIS — N3001 Acute cystitis with hematuria: Secondary | ICD-10-CM

## 2014-10-02 DIAGNOSIS — N390 Urinary tract infection, site not specified: Secondary | ICD-10-CM | POA: Insufficient documentation

## 2014-10-02 DIAGNOSIS — R300 Dysuria: Secondary | ICD-10-CM

## 2014-10-02 LAB — POCT URINALYSIS DIPSTICK
Bilirubin, UA: NEGATIVE
Glucose, UA: NEGATIVE
Ketones, UA: NEGATIVE
NITRITE UA: NEGATIVE
PH UA: 6
Protein, UA: 30
Spec Grav, UA: 1.015
UROBILINOGEN UA: 0.2

## 2014-10-02 MED ORDER — CEPHALEXIN 500 MG PO CAPS: 500.0000 mg | ORAL_CAPSULE | Freq: Two times a day (BID) | ORAL | Status: DC

## 2014-10-02 NOTE — Progress Notes (Signed)
Pre visit review using our clinic review tool, if applicable. No additional management support is needed unless otherwise documented below in the visit note. 

## 2014-10-02 NOTE — Patient Instructions (Signed)
Drink lots of water  If symptoms suddenly worsen-call and seek care  Take the keflex as directed  We will update you with culture result when it comes back

## 2014-10-02 NOTE — Progress Notes (Signed)
Subjective:    Patient ID: Jamie [name], female    DOB: 01-25-1955, 59 y.o.   MRN: [id number]  HPI Here with urinary symptoms   Just came home from [place] and started feeling bad   Has had chills in the middle of the night -monday Vomited once the next day  Felt exhausted and under the weather -slept all day Tuesday  Flew home on Wed  Felt a little better thurs -but then had freqent urination at night  (thought it was from drinking lots of water on the plane)  Today urgency and bladder spasm at the end of urination   Of note-also rectal discharge is worse than nl (it has blood in it also)    Results for orders placed or performed in visit on 10/02/14  Urinalysis Dipstick  Result Value Ref Range   Color, UA Red    Clarity, UA Cloudy    Glucose, UA Neg.    Bilirubin, UA Neg.    Ketones, UA Neg.    Spec Grav, UA 1.015    Blood, UA Large 200 Ery/uL    pH, UA 6.0    Protein, UA 30 mg/dL    Urobilinogen, UA 0.2    Nitrite, UA Neg.    Leukocytes, UA large (3+) (A) Negative    Patient Active Problem List   Diagnosis Date Noted  . Hyponatremia 07/26/2014  . Acute renal failure 07/26/2014  . Dehydration 07/26/2014  . Elevated transaminase level 12/09/2013  . Red blood cell antibody positive 07/02/2013  . Abnormal urine 05/15/2012  . Elevated C-reactive protein (CRP) 05/15/2012  . Stricture of anal canal 11/21/2011  . Rectal discharge 10/02/2011  . Routine general medical examination at a health care facility 09/24/2011  . Apnea 11/14/2010  . Neck pain 11/14/2010  . Nevus of lower leg 11/14/2010  . Other screening mammogram 10/31/2010  . Left sided abdominal pain 09/08/2010  . Stress reaction, emotional 05/02/2010  . Compulsive overeater 05/02/2010  . Arthritis   . Bowel obstruction   . KNEE PAIN 10/14/2009  . HOARSENESS 07/21/2009  . Diabetes type 2, controlled 04/23/2009  . ROSACEA 09/12/2007  . Essential hypertension 05/15/2007  . Obesity 12/13/2006  . OSA  (obstructive sleep apnea) 12/13/2006  . Ulcerative colitis 07/12/2006   Past Medical History  Diagnosis Date  . Gall stones 2008  . Colitis     with colectomy  . HTN (hypertension)   . Obesity   . Bowel obstruction     repeated (? possible adhesions) neg EGD  . Rosacea   . Hyperglycemia     mild-monitors A1C  . Sleep apnea     CPAP everynight  . Arthritis     generalized  . Gastroenteritis 07/25/14    hosp for IVF    Past Surgical History  Procedure Laterality Date  . Total abdominal hysterectomy  05/2006    for abscessed ovaries  . Colectomy  1993    has ileostomy  . Examination under anesthesia  12/06/2011    Procedure: EXAM UNDER ANESTHESIA;  Surgeon: Leighton Ruff, MD;  Location: [facility];  Service: General;  Laterality: N/A;  rectal exam under anesthesia, anal dilation, pouchoscopy    . Colostomy revision  12/06/2011    Procedure: COLOSTOMY REVISION;  Surgeon: Leighton Ruff, MD;  Location: [facility];  Service: General;  Laterality: Right;  OSTOMY revision  . Cholecystectomy     Social History  Substance Use Topics  . Smoking status: Never  Smoker   . Smokeless tobacco: None  . Alcohol Use: No   Family History  Problem Relation Age of Onset  . Cancer Father     colon  . Liver cancer Father     resection secondary to mets  . Hyperlipidemia Father   . Hypertension Father   . Allergies Father   . Ulcerative colitis Father   . Hypertension Mother   . Hyperlipidemia Mother   . Cirrhosis Mother     NASH  . Diabetes Mother   . Allergies Brother    Allergies  Allergen Reactions  . Lisinopril     REACTION: felt bad/ stomach hurt/ weak and tired  . Ciprofloxacin Rash    Erythema and pruritis around IV site, erythema and rash along vein   Current Outpatient Prescriptions on File Prior to Visit  Medication Sig Dispense Refill  . Blood Glucose Monitoring Suppl (GLUCOMETER ELITE CLASSIC) KIT by Does not apply route daily. Check  sugar once daily and as needed for DM2 250.0     . CINNAMON PO Take 1,000 mg by mouth daily.    Marland Kitchen glucose blood test strip 1 each by Other route daily. Use daily and as needed for DM@@ 250.0     . hyoscyamine (LEVSIN SL) 0.125 MG SL tablet Take 1 tablet (0.125 mg total) by mouth every 4 (four) hours as needed. For abdominal cramps 30 tablet 11  . ibuprofen (ADVIL,MOTRIN) 200 MG tablet Take 200 mg by mouth every 6 (six) hours as needed (for arthritis).     . metoprolol tartrate (LOPRESSOR) 25 MG tablet Take 1 tablet (25 mg total) by mouth daily. 90 tablet 3  . Multiple Vitamin (MULTIVITAMIN) tablet Take 1 tablet by mouth daily.      . naproxen sodium (ANAPROX) 220 MG tablet Take 220 mg by mouth daily.     . NON FORMULARY SLEEPS WITH C-PAP    . omeprazole (PRILOSEC) 20 MG capsule Take 20 mg by mouth every other day.     . ondansetron (ZOFRAN ODT) 4 MG disintegrating tablet Take 1 tablet (4 mg total) by mouth every 4 (four) hours as needed for nausea or vomiting. 20 tablet 0  . zolpidem (AMBIEN) 10 MG tablet Take 0.5-1 tablets (5-10 mg total) by mouth at bedtime as needed for sleep. TAKE 1 TABLET BY MOUTH AT BEDTIME AS NEEDED 30 tablet 0   No current facility-administered medications on file prior to visit.      Review of Systems    Review of Systems  Constitutional: Negative for , appetite change, fatigue and unexpected weight change. pos for chills (? Fever) Eyes: Negative for pain and visual disturbance.  Respiratory: Negative for cough and shortness of breath.   Cardiovascular: Negative for cp or palpitations    Gastrointestinal: Negative for , diarrhea and constipation.  Genitourinary: posfor urgency and frequency. neg for flank pain  Skin: Negative for pallor or rash   Neurological: Negative for weakness, light-headedness, numbness and headaches.  Hematological: Negative for adenopathy. Does not bruise/bleed easily.  Psychiatric/Behavioral: Negative for dysphoric mood. The patient is  not nervous/anxious.      Objective:   Physical Exam  Constitutional: She appears well-developed and well-nourished. No distress.  obese and well appearing   HENT:  Head: Normocephalic and atraumatic.  Eyes: Conjunctivae and EOM are normal. Pupils are equal, round, and reactive to light.  Neck: Normal range of motion. Neck supple.  Cardiovascular: Normal rate, regular rhythm and normal heart sounds.   Pulmonary/Chest:  Effort normal and breath sounds normal.  Abdominal: Soft. Bowel sounds are normal. She exhibits no distension. There is tenderness. There is no rebound.  No cva tenderness  Mild suprapubic tenderness   Nl app ostomy pouch   Musculoskeletal: She exhibits no edema.  Lymphadenopathy:    She has no cervical adenopathy.  Neurological: She is alert.  Skin: No rash noted.  Psychiatric: She has a normal mood and affect.          Assessment & Plan:   Problem List Items Addressed This Visit      Genitourinary   UTI (urinary tract infection) - Primary    With hematuria and also hx of chills (? Pm fever) while [place] country Cover with keflex (has has renal failure in the past with dehydration)  Fluids - push water / rest  cx pending inst if worse to seek care immediately- rev s/s of pyelonephritis       Relevant Medications   cephALEXin (KEFLEX) 500 MG capsule   Other Relevant Orders   Urine culture    Other Visit Diagnoses    Dysuria        Relevant Orders    Urinalysis Dipstick (Completed)

## 2014-10-02 NOTE — Assessment & Plan Note (Signed)
With hematuria and also hx of chills (? Pm fever) while [place] Cover with keflex (has has renal failure in the past with dehydration)  Fluids - push water / rest  cx pending inst if worse to seek care immediately- rev s/s of pyelonephritis

## 2014-10-05 LAB — URINE CULTURE

## 2014-10-21 ENCOUNTER — Telehealth: Payer: Self-pay

## 2014-10-21 MED ORDER — CEPHALEXIN 500 MG PO CAPS: 500.0000 mg | ORAL_CAPSULE | Freq: Two times a day (BID) | ORAL | Status: DC

## 2014-10-21 NOTE — Telephone Encounter (Signed)
Please call in keflex If no improvement update me  We need to consider urol eval if this continues to happen frequently

## 2014-10-21 NOTE — Telephone Encounter (Signed)
Ms. Bartle notified as instructed by telephone.

## 2014-10-21 NOTE — Telephone Encounter (Signed)
Pt left v/m; pt is in [place] now and will be returning home on 10/22/14. Pt is starting to get uncomfortable and wants to start on med before traveling for UTI; pt seen 10/02/14 for UTI. Pt will have pharmacy info when receives cb that med can be sent. Please advise.

## 2014-10-29 ENCOUNTER — Encounter: Payer: Self-pay | Admitting: Family Medicine

## 2014-10-29 ENCOUNTER — Ambulatory Visit (INDEPENDENT_AMBULATORY_CARE_PROVIDER_SITE_OTHER): Payer: BC Managed Care – PPO | Admitting: Family Medicine

## 2014-10-29 VITALS — BP 138/78 | HR 94 | Temp 98.4°F | Wt 242.5 lb

## 2014-10-29 DIAGNOSIS — R1030 Lower abdominal pain, unspecified: Secondary | ICD-10-CM

## 2014-10-29 DIAGNOSIS — N3001 Acute cystitis with hematuria: Secondary | ICD-10-CM

## 2014-10-29 LAB — POCT URINALYSIS DIPSTICK
Bilirubin, UA: NEGATIVE
Glucose, UA: NEGATIVE
Ketones, UA: NEGATIVE
NITRITE UA: POSITIVE
PH UA: 6
Spec Grav, UA: >=1.03
Urobilinogen, UA: 1

## 2014-10-29 MED ORDER — SULFAMETHOXAZOLE-TRIMETHOPRIM 800-160 MG PO TABS: 1.0000 | ORAL_TABLET | Freq: Two times a day (BID) | ORAL | Status: DC

## 2014-10-29 NOTE — Patient Instructions (Signed)
Try get a zofran down and then start the septra.  Drink plenty of water when the nausea is better.   We'll contact you with your lab report.  Take care.   Update [place] as needed.

## 2014-10-29 NOTE — Assessment & Plan Note (Signed)
D/w pt.  Would change off keflex, since had been used last month.   Reculture.  Start septra.  Likely with UTI causing her sx.   She doesn't have colon or uterus to cause her LLQ sx.  Okay for outpatient f/u, update [place] as needed.  Can use zofran prn in meantime.

## 2014-10-29 NOTE — Progress Notes (Signed)
Pre visit review using our clinic review tool, if applicable. No additional management support is needed unless otherwise documented below in the visit note.  ucx pos and treated about 1 month ago.  She got better but then had return of sx.  She got better again w/o need for repeat keflex course.  In the last day, had more lower abd pain.  Lower left abd pain and lower back pain.  Nausea and vomited 5-6 times today.  H/o ileostomy.  No known fevers.  Not lightheaded.  abd pain is less bad now compared to the middle of the night.   Her looks clearly looked abnormal but she hasn't had frank dysuria today.   Meds, vitals, and allergies reviewed.   ROS: See HPI.  Otherwise, noncontributory.  nad ncat rrr ctab abd soft LLQ slightly ttp but no rebound.  Normal BS Urostomy wnl on R side of abd No CVA pain.  Skin well perfused.

## 2014-10-30 ENCOUNTER — Ambulatory Visit: Payer: BC Managed Care – PPO | Admitting: Pulmonary Disease

## 2014-10-31 LAB — URINE CULTURE: Colony Count: 10000

## 2014-11-04 ENCOUNTER — Other Ambulatory Visit: Payer: Self-pay | Admitting: Family Medicine

## 2014-11-04 ENCOUNTER — Encounter: Payer: Self-pay | Admitting: Family Medicine

## 2014-11-04 MED ORDER — SULFAMETHOXAZOLE-TRIMETHOPRIM 800-160 MG PO TABS: 1.0000 | ORAL_TABLET | Freq: Two times a day (BID) | ORAL | Status: DC

## 2014-11-05 ENCOUNTER — Telehealth: Payer: Self-pay | Admitting: Family Medicine

## 2014-11-05 NOTE — Telephone Encounter (Signed)
Patient returned Barnett Applebaum and Regina's call.

## 2014-11-05 NOTE — Telephone Encounter (Signed)
See lab result note.

## 2014-11-07 ENCOUNTER — Encounter: Payer: Self-pay | Admitting: Family Medicine

## 2014-11-08 ENCOUNTER — Telehealth: Payer: Self-pay | Admitting: Family Medicine

## 2014-11-08 NOTE — Telephone Encounter (Signed)
Adding sulfa allergy

## 2014-11-10 ENCOUNTER — Ambulatory Visit (INDEPENDENT_AMBULATORY_CARE_PROVIDER_SITE_OTHER): Payer: BC Managed Care – PPO | Admitting: Internal Medicine

## 2014-11-10 ENCOUNTER — Encounter: Payer: Self-pay | Admitting: Internal Medicine

## 2014-11-10 VITALS — BP 110/72 | HR 86 | Ht 65.0 in | Wt 241.0 lb

## 2014-11-10 DIAGNOSIS — R0600 Dyspnea, unspecified: Secondary | ICD-10-CM | POA: Diagnosis not present

## 2014-11-10 DIAGNOSIS — G4733 Obstructive sleep apnea (adult) (pediatric): Secondary | ICD-10-CM

## 2014-11-10 NOTE — Addendum Note (Signed)
Addended by: Oscar La R on: 11/10/2014 09:55 AM   Modules accepted: Orders

## 2014-11-10 NOTE — Patient Instructions (Addendum)
--  Will send for an in-lab bipap titration.  --Do not sleep in recliner. Keep your PAP on all night.  --Will send for echocardiography. Will call with results.  --Follow up in 1 year or sooner if not doing better.

## 2014-11-10 NOTE — Progress Notes (Addendum)
[facility] Consultation      Assessment and Plan:  Severe obstructive sleep apnea. -Patient was initially diagnosed with sleep apnea on a home titration test, and underwent an in lab bipap titration. She has tried an auto-CPAP titration but per previous notes did not tolerate CPAP at that time.   Excessive daytime sleepiness.  Essential hypertension.  Nocturnal dyspnea.   ADDENDUM 11/25/14: Pt underwent bipap titration and required a pressure of 19/14, though did not not have very long sleep time at that level. Will start this level empirically and see if it is tolerated.   Date: 11/10/2014  MRN# [id number] Jamie [name] Jul 04, 1955  Referring Physician:   MAESON LOURENCO is a 59 y.o. old female seen in consultation for chief complaint of:    Chief Complaint  Patient presents with  . Follow-up    former [facility] pt; OSA; feels she is having breakthrough apnea; takes mask off during night;     HPI:  She has been using cpap but feels that she has continued sleepiness. She put the mask on at 11 pm, her husband also wears a cpap. She falls asleep within about 10 min. She sleeps until 2 to 3 am spontaneously and notes that she is panting. She then goes to recliner and sleeps there. She is not using cpap there. She notes that she sleeps well there.  She also occasionally notes that she take the mask off because she feels that she can not breathe. Other nights she sleeps the entire night without issues. She wears the mask the entire night about one night per week.  She thinks her weight has increased about 15 lbs since starting cpap.  When waking in the am feels that she is doing ok but can be tired during the day.   Per review of previous office notes. The patient had a download showed she is having continued breakthrough apnea events per hour, and did not tolerate the auto setting. She subsequently had her CPAP pressure increased to 16 cm.  -Review home sleep study  from the December 20th 2012: Apnea popping index was 37.  No flowsheet data found.  Pulmonary Functions Testing Results:  No results found for: FEV1, FVC, FEV1FVC, TLC, DLCO   PMHX:   Past Medical History  Diagnosis Date  . Gall stones 2008  . Colitis     with colectomy  . HTN (hypertension)   . Obesity   . Bowel obstruction (HCC)     repeated (? possible adhesions) neg EGD  . Rosacea   . Hyperglycemia     mild-monitors A1C  . Sleep apnea     CPAP everynight  . Arthritis     generalized  . Gastroenteritis 07/25/14    hosp for IVF    Surgical Hx:  Past Surgical History  Procedure Laterality Date  . Total abdominal hysterectomy  05/2006    for abscessed ovaries  . Colectomy  1993    has ileostomy  . Examination under anesthesia  12/06/2011    Procedure: EXAM UNDER ANESTHESIA;  Surgeon: Leighton Ruff, MD;  Location: [facility];  Service: General;  Laterality: N/A;  rectal exam under anesthesia, anal dilation, pouchoscopy    . Colostomy revision  12/06/2011    Procedure: COLOSTOMY REVISION;  Surgeon: Leighton Ruff, MD;  Location: [facility];  Service: General;  Laterality: Right;  OSTOMY revision  . Cholecystectomy     Family Hx:  Family History  Problem Relation  Age of Onset  . Cancer Father     colon  . Liver cancer Father     resection secondary to mets  . Hyperlipidemia Father   . Hypertension Father   . Allergies Father   . Ulcerative colitis Father   . Hypertension Mother   . Hyperlipidemia Mother   . Cirrhosis Mother     NASH  . Diabetes Mother   . Allergies Brother    Social Hx:   Social History  Substance Use Topics  . Smoking status: Never Smoker   . Smokeless tobacco: Not on file  . Alcohol Use: No   Medication:   Current Outpatient Rx  Name  Route  Sig  Dispense  Refill  . Blood Glucose Monitoring Suppl (GLUCOMETER ELITE CLASSIC) KIT   Does not apply   by Does not apply route daily. Check sugar once daily  and as needed for DM2 250.0          . CINNAMON PO   Oral   Take 1,000 mg by mouth daily.         . glucose blood test strip   Other   1 each by Other route daily. Use daily and as needed for DM@@ 250.0          . hyoscyamine (LEVSIN SL) 0.125 MG SL tablet   Oral   Take 1 tablet (0.125 mg total) by mouth every 4 (four) hours as needed. For abdominal cramps   30 tablet   11   . ibuprofen (ADVIL,MOTRIN) 200 MG tablet   Oral   Take 200 mg by mouth every 6 (six) hours as needed (for arthritis).          . metoprolol tartrate (LOPRESSOR) 25 MG tablet   Oral   Take 1 tablet (25 mg total) by mouth daily.   90 tablet   3   . Multiple Vitamin (MULTIVITAMIN) tablet   Oral   Take 1 tablet by mouth daily.           . naproxen sodium (ANAPROX) 220 MG tablet   Oral   Take 220 mg by mouth daily.          . NON FORMULARY      SLEEPS WITH C-PAP         . omeprazole (PRILOSEC) 20 MG capsule   Oral   Take 20 mg by mouth every other day.          . ondansetron (ZOFRAN ODT) 4 MG disintegrating tablet   Oral   Take 1 tablet (4 mg total) by mouth every 4 (four) hours as needed for nausea or vomiting.   20 tablet   0   . zolpidem (AMBIEN) 10 MG tablet   Oral   Take 0.5-1 tablets (5-10 mg total) by mouth at bedtime as needed for sleep. TAKE 1 TABLET BY MOUTH AT BEDTIME AS NEEDED   30 tablet   0       Allergies:  Lisinopril; Ciprofloxacin; and Sulfa antibiotics  Review of Systems: Gen:  Denies  fever, sweats, chills HEENT: Denies blurred vision, double vision. bleeds, sore throat Cvc:  No dizziness, chest pain. Resp:   Denies cough or sputum porduction, shortness of breath Gi: Denies swallowing difficulty, stomach pain. Gu:  Denies bladder incontinence, burning urine Ext:   No Joint pain, stiffness. Skin: No skin rash,  hives Endoc:  No polyuria, polydipsia. Psych: No depression, insomnia. Other:  All other systems were reviewed with the patient   and were  negative other that what is mentioned in the HPI.   Physical Examination:   VS: BP 110/72 mmHg  Pulse 86  Ht 5' 5" (1.651 m)  Wt 241 lb (109.317 kg)  BMI 40.10 kg/m2  SpO2 95%  General Appearance: No distress  Neuro:without focal findings,  speech normal,  HEENT: PERRLA, EOM intact.   Pulmonary: normal breath sounds, No wheezing.  CardiovascularNormal S1,S2.  No m/r/g.   Abdomen: Benign, Soft, non-tender. Ostomy in place in right side.  Renal:  No costovertebral tenderness  GU:  No performed at this time. Endoc: No evident thyromegaly, no signs of acromegaly. Skin:   warm, no rashes, no ecchymosis  Extremities: normal, no cyanosis, clubbing.  Other findings:    LABORATORY PANEL:   CBC No results for input(s): WBC, HGB, HCT, PLT in the last 168 hours. ------------------------------------------------------------------------------------------------------------------  Chemistries  No results for input(s): NA, K, CL, CO2, GLUCOSE, BUN, CREATININE, CALCIUM, MG, AST, ALT, ALKPHOS, BILITOT in the last 168 hours.  Invalid input(s): GFRCGP ------------------------------------------------------------------------------------------------------------------  Cardiac Enzymes No results for input(s): TROPONINI in the last 168 hours. ------------------------------------------------------------  RADIOLOGY:  No results found.     Thank  you for the consultation and for allowing [facility] to assist in the care of your patient. Our recommendations are noted above.  Please contact us if we can be of further service.   Marda Stalker, MD.  Board Certified in Internal Medicine, Pulmonary Medicine, [place], and Sleep Medicine.  [facility] Office Number: [phone]  Patricia Pesa, M.D.  Vilinda Boehringer, M.D.  Merton Border, M.D

## 2014-11-11 ENCOUNTER — Ambulatory Visit: Payer: BC Managed Care – PPO | Attending: Pulmonary Disease

## 2014-11-11 DIAGNOSIS — G4733 Obstructive sleep apnea (adult) (pediatric): Secondary | ICD-10-CM | POA: Diagnosis present

## 2014-11-18 ENCOUNTER — Encounter: Payer: Self-pay | Admitting: Internal Medicine

## 2014-11-18 DIAGNOSIS — G4730 Sleep apnea, unspecified: Secondary | ICD-10-CM | POA: Diagnosis not present

## 2014-11-20 ENCOUNTER — Telehealth: Payer: Self-pay | Admitting: *Deleted

## 2014-11-20 DIAGNOSIS — G4733 Obstructive sleep apnea (adult) (pediatric): Secondary | ICD-10-CM

## 2014-11-20 NOTE — Telephone Encounter (Signed)
Pt is calling asking for sleep study results. Informed pt you are out of office until 11/23/14. Her results are in your folder. Thanks.

## 2014-11-23 ENCOUNTER — Encounter: Payer: Self-pay | Admitting: Internal Medicine

## 2014-11-24 NOTE — Telephone Encounter (Signed)
Called and spoke to pt. Pt aware that we are awaiting Dr. Mathis Fare response regarding the pt's sleep study. Pt aware we will call her back once we have results.   Dr. Ashby Dawes please advise. Thanks.

## 2014-11-24 NOTE — Telephone Encounter (Signed)
Pt cb about sleep results, [phone]

## 2014-11-25 ENCOUNTER — Other Ambulatory Visit: Payer: Self-pay | Admitting: *Deleted

## 2014-11-25 DIAGNOSIS — G4733 Obstructive sleep apnea (adult) (pediatric): Secondary | ICD-10-CM

## 2014-11-25 NOTE — Telephone Encounter (Signed)
Pt informed order will be placed for BiPAP. Pt asking for copy of sleep study to be mailed.

## 2014-11-25 NOTE — Telephone Encounter (Signed)
Reviewed titration study, will need bipap at 19/14.

## 2014-12-14 ENCOUNTER — Other Ambulatory Visit: Payer: Self-pay

## 2014-12-14 ENCOUNTER — Ambulatory Visit (INDEPENDENT_AMBULATORY_CARE_PROVIDER_SITE_OTHER): Payer: BC Managed Care – PPO

## 2014-12-14 DIAGNOSIS — R0600 Dyspnea, unspecified: Secondary | ICD-10-CM | POA: Diagnosis not present

## 2014-12-14 DIAGNOSIS — G4733 Obstructive sleep apnea (adult) (pediatric): Secondary | ICD-10-CM | POA: Diagnosis not present

## 2015-01-03 ENCOUNTER — Telehealth: Payer: Self-pay | Admitting: Family Medicine

## 2015-01-03 DIAGNOSIS — E119 Type 2 diabetes mellitus without complications: Secondary | ICD-10-CM

## 2015-01-03 DIAGNOSIS — Z0000 Encounter for general adult medical examination without abnormal findings: Secondary | ICD-10-CM

## 2015-01-03 NOTE — Telephone Encounter (Signed)
-----   Message from Ellamae Sia sent at 12/29/2014  4:07 PM EST ----- Regarding: Lab orders for Monday,12.12.16 Patient is scheduled for CPX labs, please order future labs, Thanks , Karna Christmas

## 2015-01-04 ENCOUNTER — Other Ambulatory Visit (INDEPENDENT_AMBULATORY_CARE_PROVIDER_SITE_OTHER): Payer: BC Managed Care – PPO | Admitting: Radiology

## 2015-01-04 DIAGNOSIS — Z1159 Encounter for screening for other viral diseases: Secondary | ICD-10-CM

## 2015-01-05 ENCOUNTER — Other Ambulatory Visit: Payer: Self-pay | Admitting: Family Medicine

## 2015-01-05 ENCOUNTER — Other Ambulatory Visit (INDEPENDENT_AMBULATORY_CARE_PROVIDER_SITE_OTHER): Payer: BC Managed Care – PPO

## 2015-01-05 DIAGNOSIS — Z0000 Encounter for general adult medical examination without abnormal findings: Secondary | ICD-10-CM

## 2015-01-05 DIAGNOSIS — E119 Type 2 diabetes mellitus without complications: Secondary | ICD-10-CM

## 2015-01-05 DIAGNOSIS — Z1159 Encounter for screening for other viral diseases: Secondary | ICD-10-CM

## 2015-01-05 LAB — COMPREHENSIVE METABOLIC PANEL
ALK PHOS: 74 U/L (ref 39–117)
ALT: 39 U/L — AB (ref 0–35)
AST: 45 U/L — ABNORMAL HIGH (ref 0–37)
Albumin: 3.8 g/dL (ref 3.5–5.2)
BUN: 12 mg/dL (ref 6–23)
CALCIUM: 9.5 mg/dL (ref 8.4–10.5)
CO2: 23 mEq/L (ref 19–32)
Chloride: 103 mEq/L (ref 96–112)
Creatinine, Ser: 1.05 mg/dL (ref 0.40–1.20)
GFR: 56.71 mL/min — AB (ref >60.00)
Glucose, Bld: 139 mg/dL — ABNORMAL HIGH (ref 70–99)
Potassium: 4.3 mEq/L (ref 3.5–5.1)
Sodium: 135 mEq/L (ref 135–145)
TOTAL PROTEIN: 7.2 g/dL (ref 6.0–8.3)
Total Bilirubin: 0.7 mg/dL (ref 0.2–1.2)

## 2015-01-05 LAB — LIPID PANEL
CHOLESTEROL: 167 mg/dL (ref 0–200)
HDL: 52.1 mg/dL (ref >39.00)
LDL Cholesterol: 76 mg/dL (ref 0–99)
NonHDL: 114.54
TRIGLYCERIDES: 191 mg/dL — AB (ref 0.0–149.0)
Total CHOL/HDL Ratio: 3
VLDL: 38.2 mg/dL (ref 0.0–40.0)

## 2015-01-05 LAB — CBC WITH DIFFERENTIAL/PLATELET
Basophils Absolute: 0 10*3/uL (ref 0.0–0.1)
Basophils Relative: 0.5 % (ref 0.0–3.0)
EOS PCT: 4 % (ref 0.0–5.0)
Eosinophils Absolute: 0.3 10*3/uL (ref 0.0–0.7)
HCT: 42.8 % (ref 36.0–46.0)
HEMOGLOBIN: 14.3 g/dL (ref 12.0–15.0)
Lymphocytes Relative: 24.6 % (ref 12.0–46.0)
Lymphs Abs: 1.8 10*3/uL (ref 0.7–4.0)
MCHC: 33.4 g/dL (ref 30.0–36.0)
MCV: 92.3 fl (ref 78.0–100.0)
MONOS PCT: 6.5 % (ref 3.0–12.0)
Monocytes Absolute: 0.5 10*3/uL (ref 0.1–1.0)
Neutro Abs: 4.8 10*3/uL (ref 1.4–7.7)
Neutrophils Relative %: 64.4 % (ref 43.0–77.0)
Platelets: 289 10*3/uL (ref 150.0–400.0)
RBC: 4.63 Mil/uL (ref 3.87–5.11)
RDW: 13.9 % (ref 11.5–15.5)
WBC: 7.4 10*3/uL (ref 4.0–10.5)

## 2015-01-05 LAB — HEMOGLOBIN A1C: Hgb A1c MFr Bld: 6.8 % — ABNORMAL HIGH (ref 4.6–6.5)

## 2015-01-05 LAB — TSH: TSH: 1.5 u[IU]/mL (ref 0.35–4.50)

## 2015-01-06 LAB — HEPATITIS C ANTIBODY: HCV Ab: NEGATIVE

## 2015-01-08 ENCOUNTER — Ambulatory Visit (INDEPENDENT_AMBULATORY_CARE_PROVIDER_SITE_OTHER): Payer: BC Managed Care – PPO | Admitting: Family Medicine

## 2015-01-08 ENCOUNTER — Encounter: Payer: Self-pay | Admitting: Family Medicine

## 2015-01-08 VITALS — BP 128/80 | HR 74 | Temp 97.6°F | Ht 65.0 in | Wt 247.8 lb

## 2015-01-08 DIAGNOSIS — E669 Obesity, unspecified: Secondary | ICD-10-CM

## 2015-01-08 DIAGNOSIS — E119 Type 2 diabetes mellitus without complications: Secondary | ICD-10-CM | POA: Diagnosis not present

## 2015-01-08 DIAGNOSIS — R7401 Elevation of levels of liver transaminase levels: Secondary | ICD-10-CM

## 2015-01-08 DIAGNOSIS — K51019 Ulcerative (chronic) pancolitis with unspecified complications: Secondary | ICD-10-CM

## 2015-01-08 DIAGNOSIS — Z0000 Encounter for general adult medical examination without abnormal findings: Secondary | ICD-10-CM

## 2015-01-08 DIAGNOSIS — I10 Essential (primary) hypertension: Secondary | ICD-10-CM | POA: Diagnosis not present

## 2015-01-08 DIAGNOSIS — Z23 Encounter for immunization: Secondary | ICD-10-CM | POA: Diagnosis not present

## 2015-01-08 DIAGNOSIS — R740 Nonspecific elevation of levels of transaminase and lactic acid dehydrogenase [LDH]: Secondary | ICD-10-CM

## 2015-01-08 LAB — MICROALBUMIN / CREATININE URINE RATIO
Creatinine,U: 352.7 mg/dL
Microalb Creat Ratio: 0.4 mg/g (ref 0.0–30.0)
Microalb, Ur: 1.4 mg/dL (ref 0.0–1.9)

## 2015-01-08 NOTE — Progress Notes (Signed)
Pre visit review using our clinic review tool, if applicable. No additional management support is needed unless otherwise documented below in the visit note. 

## 2015-01-08 NOTE — Progress Notes (Signed)
Subjective:    Patient ID: Jamie [name], female    DOB: 1955-04-26, 59 y.o.   MRN: [id number]  HPI Here for health maintenance exam and to review chronic medical problems    Has been busy   Feeling pretty good  Nothing new going on  Aggravating -stiffness - esp after inactivity  Some exercise - she was going to dance aerobics for a while - has not been back  Was dx with lichen planus by her dentist   Had a new sleep study -on bipap instead of cpap- is sleeping better and longer  Also had an echocardiogram -norma   Wt is up 6 lb with bmi of 41 Eating is not great / went on a cruise in Nov -but diet was bad (ate a lot)   HIV screen - has had and it was neg at the health dept (approx late 1997)  Pap 3/08 nl - had hysterectomy 2008  No gyn problems   Zoster vaccine - insurance co said she could have it here =-would like to get it today  Has had a colectomy due to UC  No change with her colostomy - has some chronic pain around stoma (no longer uses steroid spray)  Has rectal problems with d/c that is not treatable   Mm nl 1/16 Self breast exam-no lumps or changes   Flu shot 10/16  PNA 11/15  Td 9/14   Hep C screen 12/16   bp is stable today  No cp or palpitations or headaches or edema  No side effects to medicines  BP Readings from Last 3 Encounters:  01/08/15 138/52  11/10/14 110/72  10/29/14 138/78    No problems at home    DM2 Lab Results  Component Value Date   HGBA1C 6.8* 01/05/2015  same as last time  Eating is the same  Last eye exam was July - no retinopathy  Cholesterol Lab Results  Component Value Date   CHOL 167 01/05/2015   CHOL 180 11/28/2013   CHOL 140 09/25/2012   Lab Results  Component Value Date   HDL 52.10 01/05/2015   HDL 39.70 11/28/2013   HDL 35.20* 09/25/2012   Lab Results  Component Value Date   LDLCALC 76 01/05/2015   LDLCALC 65 09/25/2012   [place] 90 09/26/2011   Lab Results  Component Value Date   TRIG 191.0*  01/05/2015   TRIG 219.0* 11/28/2013   TRIG 198.0* 09/25/2012   Lab Results  Component Value Date   CHOLHDL 3 01/05/2015   CHOLHDL 5 11/28/2013   CHOLHDL 4 09/25/2012   Lab Results  Component Value Date   LDLDIRECT 100.1 11/28/2013   LDLDIRECT 110.3 04/25/2010   good - improved risk ratio  Unsure what she is doing differently - exercise helpig HDL   Liver fxn  Lab Results  Component Value Date   ALT 39* 01/05/2015   AST 45* 01/05/2015   ALKPHOS 74 01/05/2015   BILITOT 0.7 01/05/2015    Down form 68 and 39 for ALT fam hx NASH  No symptoms   Patient Active Problem List   Diagnosis Date Noted  . Morbid obesity ([place]) 01/08/2015  . UTI (urinary tract infection) 10/02/2014  . Hyponatremia 07/26/2014  . Acute renal failure ([place]) 07/26/2014  . Dehydration 07/26/2014  . Elevated transaminase level 12/09/2013  . Red blood cell antibody positive 07/02/2013  . Abnormal urine 05/15/2012  . Elevated C-reactive protein (CRP) 05/15/2012  . Stricture of anal canal 11/21/2011  .  Rectal discharge 10/02/2011  . Routine general medical examination at a health care facility 09/24/2011  . Apnea 11/14/2010  . Neck pain 11/14/2010  . Nevus of lower leg 11/14/2010  . Other screening mammogram 10/31/2010  . Left sided abdominal pain 09/08/2010  . Stress reaction, emotional 05/02/2010  . Compulsive overeater 05/02/2010  . Arthritis   . Bowel obstruction ([place])   . KNEE PAIN 10/14/2009  . HOARSENESS 07/21/2009  . Diabetes type 2, controlled ([place]) 04/23/2009  . ROSACEA 09/12/2007  . Essential hypertension 05/15/2007  . OSA (obstructive sleep apnea) 12/13/2006  . Ulcerative colitis ([place]) 07/12/2006   Past Medical History  Diagnosis Date  . Gall stones 2008  . Colitis     with colectomy  . HTN (hypertension)   . Obesity   . Bowel obstruction (HCC)     repeated (? possible adhesions) neg EGD  . Rosacea   . Hyperglycemia     mild-monitors A1C  . Sleep apnea     CPAP everynight    . Arthritis     generalized  . Gastroenteritis 07/25/14    hosp for IVF    Past Surgical History  Procedure Laterality Date  . Total abdominal hysterectomy  05/2006    for abscessed ovaries  . Colectomy  1993    has ileostomy  . Examination under anesthesia  12/06/2011    Procedure: EXAM UNDER ANESTHESIA;  Surgeon: Leighton Ruff, MD;  Location: [facility];  Service: General;  Laterality: N/A;  rectal exam under anesthesia, anal dilation, pouchoscopy    . Colostomy revision  12/06/2011    Procedure: COLOSTOMY REVISION;  Surgeon: Leighton Ruff, MD;  Location: [facility];  Service: General;  Laterality: Right;  OSTOMY revision  . Cholecystectomy     Social History  Substance Use Topics  . Smoking status: Never Smoker   . Smokeless tobacco: None  . Alcohol Use: No   Family History  Problem Relation Age of Onset  . Cancer Father     colon  . Liver cancer Father     resection secondary to mets  . Hyperlipidemia Father   . Hypertension Father   . Allergies Father   . Ulcerative colitis Father   . Hypertension Mother   . Hyperlipidemia Mother   . Cirrhosis Mother     NASH  . Diabetes Mother   . Allergies Brother    Allergies  Allergen Reactions  . Lisinopril     REACTION: felt bad/ stomach hurt/ weak and tired  . Ciprofloxacin Rash    Erythema and pruritis around IV site, erythema and rash along vein  . Sulfa Antibiotics Rash   Current Outpatient Prescriptions on File Prior to Visit  Medication Sig Dispense Refill  . Blood Glucose Monitoring Suppl (GLUCOMETER ELITE CLASSIC) KIT by Does not apply route daily. Check sugar once daily and as needed for DM2 250.0     . CINNAMON PO Take 1,000 mg by mouth daily.    Marland Kitchen glucose blood test strip 1 each by Other route daily. Use daily and as needed for DM@@ 250.0     . hyoscyamine (LEVSIN SL) 0.125 MG SL tablet Take 1 tablet (0.125 mg total) by mouth every 4 (four) hours as needed. For abdominal cramps  30 tablet 11  . ibuprofen (ADVIL,MOTRIN) 200 MG tablet Take 200 mg by mouth every 6 (six) hours as needed (for arthritis).     . metoprolol tartrate (LOPRESSOR) 25 MG tablet Take 1 tablet (25 mg  total) by mouth daily. 90 tablet 3  . Multiple Vitamin (MULTIVITAMIN) tablet Take 1 tablet by mouth daily.      . naproxen sodium (ANAPROX) 220 MG tablet Take 220 mg by mouth daily.     . NON FORMULARY SLEEPS WITH C-PAP    . omeprazole (PRILOSEC) 20 MG capsule Take 20 mg by mouth every other day.     . ondansetron (ZOFRAN ODT) 4 MG disintegrating tablet Take 1 tablet (4 mg total) by mouth every 4 (four) hours as needed for nausea or vomiting. 20 tablet 0  . zolpidem (AMBIEN) 10 MG tablet Take 0.5-1 tablets (5-10 mg total) by mouth at bedtime as needed for sleep. TAKE 1 TABLET BY MOUTH AT BEDTIME AS NEEDED 30 tablet 0   No current facility-administered medications on file prior to visit.     Review of Systems Review of Systems  Constitutional: Negative for fever, appetite change, fatigue and unexpected weight change.  Eyes: Negative for pain and visual disturbance.  Respiratory: Negative for cough and shortness of breath.   Cardiovascular: Negative for cp or palpitations    Gastrointestinal: Negative for nausea, diarrhea and constipation.  Genitourinary: Negative for urgency and frequency.  Skin: Negative for pallor or rash   MSK pos for aches/pains and joint stiffness Neurological: Negative for weakness, light-headedness, numbness and headaches.  Hematological: Negative for adenopathy. Does not bruise/bleed easily.  Psychiatric/Behavioral: Negative for dysphoric mood. The patient is not nervous/anxious.         Objective:   Physical Exam  Constitutional: She appears well-developed and well-nourished. No distress.  Morbidly obese and well app  HENT:  Head: Normocephalic and atraumatic.  Right Ear: External ear normal.  Left Ear: External ear normal.  Mouth/Throat: Oropharynx is clear and  moist.  Eyes: Conjunctivae and EOM are normal. Pupils are equal, round, and reactive to light. No scleral icterus.  Neck: Normal range of motion. Neck supple. No JVD present. Carotid bruit is not present. No thyromegaly present.  Cardiovascular: Normal rate, regular rhythm, normal heart sounds and intact distal pulses.  Exam reveals no gallop.   Pulmonary/Chest: Effort normal and breath sounds normal. No respiratory distress. She has no wheezes. She exhibits no tenderness.  Abdominal: Soft. Bowel sounds are normal. She exhibits no distension, no abdominal bruit and no mass. There is no tenderness.  Colostomy site is stable without skin irritation today  Genitourinary: No breast swelling, tenderness, discharge or bleeding.  Breast exam: No mass, nodules, thickening, tenderness, bulging, retraction, inflamation, nipple discharge or skin changes noted.  No axillary or clavicular LA.      Musculoskeletal: Normal range of motion. She exhibits no edema or tenderness.  No kyphosis   Lymphadenopathy:    She has no cervical adenopathy.  Neurological: She is alert. She has normal reflexes. No cranial nerve deficit. She exhibits normal muscle tone. Coordination normal.  Skin: Skin is warm and dry. No rash noted. No erythema. No pallor.  Psychiatric: She has a normal mood and affect.          Assessment & Plan:   Problem List Items Addressed This Visit      Cardiovascular and Mediastinum   Essential hypertension - Primary    bp in fair control at this time  BP Readings from Last 1 Encounters:  01/08/15 128/80   No changes needed Disc lifstyle change with low sodium diet and exercise   Labs reviewed         Digestive   Ulcerative colitis ([place])  S/p colectomy and colostomy- some chronic problems with colostomy site /under control now (skin issues)  Also chronic rectal drainage         Endocrine   Diabetes type 2, controlled ([place])    Lab Results  Component Value Date   HGBA1C 6.8*  01/05/2015   Fairly stable Enc low glycemic diet and wt loss         Other   Elevated transaminase level    Suspect due to obesity/fatty liver  NASH runs in her family No etoh or acetaminophen  No pain or other symptoms slt imp now  Will continue to follow      Morbid obesity ([place])    Discussed how this problem influences overall health and the risks it imposes  Reviewed plan for weight loss with lower calorie diet (via better food choices and also portion control or program like weight watchers) and exercise building up to or more than 30 minutes 5 days per week including some aerobic activity    Pt has problems with emotional overeating to address  Is working on this       RESOLVED: Obesity   Routine general medical examination at a health care facility    Reviewed health habits including diet and exercise and skin cancer prevention Reviewed appropriate screening tests for age  Also reviewed health mt list, fam hx and immunization status , as well as social and family history   See HPI Labs reviewed Blood pressure was better on the 2nd check  Shingles vaccine today  Don't forget to schedule you mammogram next month  Eat a healthy balanced diet  Exercise for weight loss  Try to get 1200-1500 mg of calcium per day with at least 1000 iu of vitamin D - for bone health        Other Visit Diagnoses    Need for shingles vaccine        Relevant Orders    Varicella-zoster vaccine subcutaneous (Completed)

## 2015-01-08 NOTE — Patient Instructions (Addendum)
Blood pressure was better on the 2nd check  Shingles vaccine today  Don't forget to schedule you mammogram next month  Eat a healthy balanced diet  Exercise for weight loss  Try to get 1200-1500 mg of calcium per day with at least 1000 iu of vitamin D - for bone health

## 2015-01-10 NOTE — Assessment & Plan Note (Signed)
bp in fair control at this time  BP Readings from Last 1 Encounters:  01/08/15 128/80   No changes needed Disc lifstyle change with low sodium diet and exercise   Labs reviewed

## 2015-01-10 NOTE — Assessment & Plan Note (Signed)
Reviewed health habits including diet and exercise and skin cancer prevention Reviewed appropriate screening tests for age  Also reviewed health mt list, fam hx and immunization status , as well as social and family history   See HPI Labs reviewed Blood pressure was better on the 2nd check  Shingles vaccine today  Don't forget to schedule you mammogram next month  Eat a healthy balanced diet  Exercise for weight loss  Try to get 1200-1500 mg of calcium per day with at least 1000 iu of vitamin D - for bone health

## 2015-01-10 NOTE — Assessment & Plan Note (Signed)
Discussed how this problem influences overall health and the risks it imposes  Reviewed plan for weight loss with lower calorie diet (via better food choices and also portion control or program like weight watchers) and exercise building up to or more than 30 minutes 5 days per week including some aerobic activity    Pt has problems with emotional overeating to address  Is working on this

## 2015-01-10 NOTE — Assessment & Plan Note (Signed)
Lab Results  Component Value Date   HGBA1C 6.8* 01/05/2015   Fairly stable Enc low glycemic diet and wt loss

## 2015-01-10 NOTE — Assessment & Plan Note (Signed)
S/p colectomy and colostomy- some chronic problems with colostomy site /under control now (skin issues)  Also chronic rectal drainage

## 2015-01-10 NOTE — Assessment & Plan Note (Signed)
Suspect due to obesity/fatty liver  NASH runs in her family No etoh or acetaminophen  No pain or other symptoms slt imp now  Will continue to follow

## 2015-02-05 ENCOUNTER — Other Ambulatory Visit: Payer: Self-pay | Admitting: Family Medicine

## 2015-02-05 DIAGNOSIS — Z1231 Encounter for screening mammogram for malignant neoplasm of breast: Secondary | ICD-10-CM

## 2015-02-10 ENCOUNTER — Ambulatory Visit
Admission: RE | Admit: 2015-02-10 | Discharge: 2015-02-10 | Disposition: A | Discharge status: Home / Self Care | Payer: BC Managed Care – PPO | Source: Ambulatory Visit | Attending: Family Medicine | Admitting: Family Medicine

## 2015-02-10 DIAGNOSIS — Z1231 Encounter for screening mammogram for malignant neoplasm of breast: Secondary | ICD-10-CM | POA: Diagnosis present

## 2015-02-19 ENCOUNTER — Other Ambulatory Visit: Payer: Self-pay | Admitting: Family Medicine

## 2015-06-10 ENCOUNTER — Telehealth: Payer: Self-pay | Admitting: Family Medicine

## 2015-06-10 MED ORDER — FLUTICASONE PROPIONATE HFA 220 MCG/ACT IN AERO: INHALATION_SPRAY | RESPIRATORY_TRACT | Status: DC

## 2015-06-10 NOTE — Telephone Encounter (Signed)
Patient left message on triage line, she has an ileostomy and appears to have a yeast infection under the adhesive.  This area is itching profusely and in the past she has gotten Flovent to help with the discomfort.  Please advise.  Pharmacy of choice is [facility] [address].  Best number to call pt is [phone]

## 2015-06-10 NOTE — Telephone Encounter (Signed)
I sent the flovent-this has helped with the itch in the past  It will not cure the yeast infection however- what has she used for yeast in the past?

## 2015-06-11 NOTE — Telephone Encounter (Signed)
Pt notified of Dr. Marliss Coots comments and verbalized understanding

## 2015-06-11 NOTE — Telephone Encounter (Signed)
Pt said in the past she has only used flovent, the flovent helped the itching enough for it to clear up since she can't use any topical ointment/creams there but she wanted me to ask you if there is something else you can recommend to help

## 2015-06-11 NOTE — Telephone Encounter (Signed)
If it does not go away with the flovent -we could try an oral diflucan pill for yeast -only if we need to , so keep me posted please

## 2015-06-28 ENCOUNTER — Telehealth: Payer: Self-pay | Admitting: Family Medicine

## 2015-06-28 DIAGNOSIS — E119 Type 2 diabetes mellitus without complications: Secondary | ICD-10-CM

## 2015-06-28 DIAGNOSIS — R740 Nonspecific elevation of levels of transaminase and lactic acid dehydrogenase [LDH]: Secondary | ICD-10-CM

## 2015-06-28 DIAGNOSIS — I10 Essential (primary) hypertension: Secondary | ICD-10-CM

## 2015-06-28 DIAGNOSIS — R7401 Elevation of levels of liver transaminase levels: Secondary | ICD-10-CM

## 2015-06-28 NOTE — Telephone Encounter (Signed)
-----   Message from Marchia Bond sent at 06/28/2015  1:41 PM EDT ----- Regarding: 6 mo f/u labs Fri 6/9, need orders. Thanks! :-) Please order future 6 mo f/u labs for pt's upcoming lab appt. Thanks Aniceto Boss

## 2015-07-02 ENCOUNTER — Other Ambulatory Visit (INDEPENDENT_AMBULATORY_CARE_PROVIDER_SITE_OTHER): Payer: BC Managed Care – PPO

## 2015-07-02 DIAGNOSIS — R740 Nonspecific elevation of levels of transaminase and lactic acid dehydrogenase [LDH]: Secondary | ICD-10-CM

## 2015-07-02 DIAGNOSIS — E119 Type 2 diabetes mellitus without complications: Secondary | ICD-10-CM | POA: Diagnosis not present

## 2015-07-02 DIAGNOSIS — R7401 Elevation of levels of liver transaminase levels: Secondary | ICD-10-CM

## 2015-07-02 LAB — HEMOGLOBIN A1C: Hgb A1c MFr Bld: 6.2 % (ref 4.6–6.5)

## 2015-07-02 LAB — HEPATIC FUNCTION PANEL
ALBUMIN: 3.7 g/dL (ref 3.5–5.2)
ALK PHOS: 65 U/L (ref 39–117)
ALT: 20 U/L (ref 0–35)
AST: 23 U/L (ref 0–37)
Bilirubin, Direct: 0.1 mg/dL (ref 0.0–0.3)
TOTAL PROTEIN: 7 g/dL (ref 6.0–8.3)
Total Bilirubin: 0.8 mg/dL (ref 0.2–1.2)

## 2015-07-08 LAB — HM DIABETES EYE EXAM

## 2015-07-09 ENCOUNTER — Ambulatory Visit: Payer: BC Managed Care – PPO | Admitting: Family Medicine

## 2015-07-16 ENCOUNTER — Encounter: Payer: Self-pay | Admitting: Family Medicine

## 2015-07-16 ENCOUNTER — Ambulatory Visit (INDEPENDENT_AMBULATORY_CARE_PROVIDER_SITE_OTHER): Payer: BC Managed Care – PPO | Admitting: Family Medicine

## 2015-07-16 VITALS — BP 128/76 | HR 71 | Temp 97.4°F | Ht 65.0 in | Wt 234.8 lb

## 2015-07-16 DIAGNOSIS — E119 Type 2 diabetes mellitus without complications: Secondary | ICD-10-CM

## 2015-07-16 DIAGNOSIS — I10 Essential (primary) hypertension: Secondary | ICD-10-CM | POA: Diagnosis not present

## 2015-07-16 DIAGNOSIS — S90121A Contusion of right lesser toe(s) without damage to nail, initial encounter: Secondary | ICD-10-CM

## 2015-07-16 DIAGNOSIS — S90129A Contusion of unspecified lesser toe(s) without damage to nail, initial encounter: Secondary | ICD-10-CM | POA: Insufficient documentation

## 2015-07-16 MED ORDER — HYOSCYAMINE SULFATE 0.125 MG SL SUBL: 0.1250 mg | SUBLINGUAL_TABLET | SUBLINGUAL | Status: DC | PRN

## 2015-07-16 NOTE — Progress Notes (Signed)
Pre visit review using our clinic review tool, if applicable. No additional management support is needed unless otherwise documented below in the visit note. 

## 2015-07-16 NOTE — Assessment & Plan Note (Signed)
Lab Results  Component Value Date   HGBA1C 6.2 07/02/2015   Improved with 15 lb wt loss Enc to keep going to wt watchers and plan an exercise program  Will call if she needs new monitoring supplies microalb and opthy appts are up to date

## 2015-07-16 NOTE — Patient Instructions (Addendum)
Keep up the great job with weight loss  Labs are improved  Plan a regular exercise program now that yard work has slowed down  Continue weight watchers  Follow up for annual exam in mid Dec Use a cold compress on your toe Buddy tape it to the next toe if necessary- let me know if no improved   Let me know if you need px for diabetic supplies

## 2015-07-16 NOTE — Assessment & Plan Note (Signed)
R 2nd toe- after stubbing it  Swelling and ecchymosis are improving with time Disc buddy taping to 1st toe  Alert if inc in pain  Likely xray would not change management and pt would like to avoid it

## 2015-07-16 NOTE — Progress Notes (Signed)
Subjective:    Patient ID: Jamie [name], female    DOB: 08-27-1955, 60 y.o.   MRN: [id number]  HPI Here for f/u of chronic medical   Morbid obesity-now bmi is 39 (under 40)-excellent  Wt Readings from Last 3 Encounters:  07/16/15 234 lb 12 oz (106.482 kg)  01/08/15 247 lb 12 oz (112.379 kg)  11/10/14 241 lb (109.317 kg)   lost 13 lb since last visit   (15 lb by her scales) Went back to [employer]  Eating a lot better  Exercise- a lot of yard work/ heavy duty shoveling and weeding   bp is stable today  No cp or palpitations or headaches or edema  No side effects to medicines  BP Readings from Last 3 Encounters:  01/08/15 128/80  11/10/14 110/72  10/29/14 138/78       Lab Results  Component Value Date   HGBA1C 6.2 07/02/2015  eating better  This is down from 6.8 Lab Results  Component Value Date   MICROALBUR 1.4 01/08/2015    Eye exam - is due in July    Liver numbers are normal Lab Results  Component Value Date   ALT 20 07/02/2015   AST 23 07/02/2015   ALKPHOS 65 07/02/2015   BILITOT 0.8 07/02/2015    Hurt her toe - stubbed it on the wheel of suitcase  R 2nd toe Was very bruised    Patient Active Problem List   Diagnosis Date Noted  . Toe contusion 07/16/2015  . Morbid obesity ([place]) 01/08/2015  . Elevated transaminase level 12/09/2013  . Red blood cell antibody positive 07/02/2013  . Abnormal urine 05/15/2012  . Elevated C-reactive protein (CRP) 05/15/2012  . Stricture of anal canal 11/21/2011  . Rectal discharge 10/02/2011  . Routine general medical examination at a health care facility 09/24/2011  . Apnea 11/14/2010  . Neck pain 11/14/2010  . Nevus of lower leg 11/14/2010  . Other screening mammogram 10/31/2010  . Left sided abdominal pain 09/08/2010  . Stress reaction, emotional 05/02/2010  . Compulsive overeater 05/02/2010  . Arthritis   . Bowel obstruction ([place])   . KNEE PAIN 10/14/2009  . HOARSENESS 07/21/2009  . Diabetes type  2, controlled ([place]) 04/23/2009  . ROSACEA 09/12/2007  . Essential hypertension 05/15/2007  . OSA (obstructive sleep apnea) 12/13/2006  . Ulcerative colitis ([place]) 07/12/2006   Past Medical History  Diagnosis Date  . Gall stones 2008  . Colitis     with colectomy  . HTN (hypertension)   . Obesity   . Bowel obstruction ([facility])     repeated (? possible adhesions) neg EGD  . Rosacea   . Hyperglycemia     mild-monitors A1C  . Sleep apnea     CPAP everynight  . Arthritis     generalized  . Gastroenteritis 07/25/14    hosp for IVF    Past Surgical History  Procedure Laterality Date  . Total abdominal hysterectomy  05/2006    for abscessed ovaries  . Colectomy  1993    has ileostomy  . Examination under anesthesia  12/06/2011    Procedure: EXAM UNDER ANESTHESIA;  Surgeon: Leighton Ruff, MD;  Location: [facility];  Service: General;  Laterality: N/A;  rectal exam under anesthesia, anal dilation, pouchoscopy    . Colostomy revision  12/06/2011    Procedure: COLOSTOMY REVISION;  Surgeon: Leighton Ruff, MD;  Location: [facility];  Service: General;  Laterality: Right;  OSTOMY revision  .  Cholecystectomy     Social History  Substance Use Topics  . Smoking status: Never Smoker   . Smokeless tobacco: None  . Alcohol Use: No   Family History  Problem Relation Age of Onset  . Cancer Father     colon  . Liver cancer Father     resection secondary to mets  . Hyperlipidemia Father   . Hypertension Father   . Allergies Father   . Ulcerative colitis Father   . Hypertension Mother   . Hyperlipidemia Mother   . Cirrhosis Mother     NASH  . Diabetes Mother   . Allergies Brother   . Breast cancer Paternal Grandmother   . Breast cancer Cousin 65    paternal cousins   Allergies  Allergen Reactions  . Lisinopril     REACTION: felt bad/ stomach hurt/ weak and tired  . Ciprofloxacin Rash    Erythema and pruritis around IV site, erythema and rash along  vein  . Sulfa Antibiotics Rash   Current Outpatient Prescriptions on File Prior to Visit  Medication Sig Dispense Refill  . Blood Glucose Monitoring Suppl (GLUCOMETER ELITE CLASSIC) KIT by Does not apply route daily. Check sugar once daily and as needed for DM2 250.0     . CINNAMON PO Take 1,000 mg by mouth daily.    . fluticasone (FLOVENT HFA) 220 MCG/ACT inhaler APPLY 2 PUFFS TOPICALLY TO CHOLOSTOMY SITE EVERY DAY AS DIRECTED 12 g 5  . ibuprofen (ADVIL,MOTRIN) 200 MG tablet Take 200 mg by mouth every 6 (six) hours as needed (for arthritis).     . metoprolol tartrate (LOPRESSOR) 25 MG tablet TAKE 1 TABLET (25 MG TOTAL) BY MOUTH DAILY. 90 tablet 1  . Multiple Vitamin (MULTIVITAMIN) tablet Take 1 tablet by mouth daily.      . naproxen sodium (ANAPROX) 220 MG tablet Take 220 mg by mouth daily.     . NON FORMULARY SLEEPS WITH C-PAP    . omeprazole (PRILOSEC) 20 MG capsule Take 20 mg by mouth every other day.     . ondansetron (ZOFRAN ODT) 4 MG disintegrating tablet Take 1 tablet (4 mg total) by mouth every 4 (four) hours as needed for nausea or vomiting. 20 tablet 0  . zolpidem (AMBIEN) 10 MG tablet Take 0.5-1 tablets (5-10 mg total) by mouth at bedtime as needed for sleep. TAKE 1 TABLET BY MOUTH AT BEDTIME AS NEEDED 30 tablet 0  . glucose blood test strip 1 each by Other route daily. Reported on 07/16/2015     No current facility-administered medications on file prior to visit.    Review of Systems    Review of Systems  Constitutional: Negative for fever, appetite change, fatigue and unexpected weight change.  Eyes: Negative for pain and visual disturbance.  Respiratory: Negative for cough and shortness of breath.   Cardiovascular: Negative for cp or palpitations    Gastrointestinal: Negative for nausea,  and constipation. pos for intermittent diarrhea from her colostomy  Genitourinary: Negative for urgency and frequency.  Skin: Negative for pallor or rash   Neurological: Negative for  weakness, light-headedness, numbness and headaches.  MSK pos for toe pain after injury that is much improved now  Hematological: Negative for adenopathy. Does not bruise/bleed easily.  Psychiatric/Behavioral: Negative for dysphoric mood. The patient is not nervous/anxious.      Objective:   Physical Exam  Constitutional: She appears well-developed and well-nourished. No distress.  Obese and well appearing   HENT:  Head: Normocephalic and  atraumatic.  Mouth/Throat: Oropharynx is clear and moist.  Eyes: Conjunctivae and EOM are normal. Pupils are equal, round, and reactive to light.  Neck: Normal range of motion. Neck supple. No JVD present. Carotid bruit is not present. No thyromegaly present.  Cardiovascular: Normal rate, regular rhythm, normal heart sounds and intact distal pulses.  Exam reveals no gallop.   Pulmonary/Chest: Effort normal and breath sounds normal. No respiratory distress. She has no wheezes. She has no rales.  No crackles  Abdominal: Soft. Bowel sounds are normal. She exhibits no distension, no abdominal bruit and no mass. There is no tenderness.  Nl app colostomy bag and no rash on abdomen  Musculoskeletal: She exhibits no edema.  R 2nd toe is swollen distally with some evolving ecchymosis Nl rom, mildly tender and nl gait Nl sens and perfusion   Lymphadenopathy:    She has no cervical adenopathy.  Neurological: She is alert. She has normal reflexes. She displays no atrophy. No sensory deficit.  Skin: Skin is warm and dry. No rash noted.  Psychiatric: She has a normal mood and affect.          Assessment & Plan:   Problem List Items Addressed This Visit      Cardiovascular and Mediastinum   Essential hypertension - Primary    bp in fair control at this time  BP Readings from Last 1 Encounters:  07/16/15 128/76   No changes needed Disc lifstyle change with low sodium diet and exercise  Labs are improved  Enc further wt loss and commended         Endocrine   Diabetes type 2, controlled ([place])    Lab Results  Component Value Date   HGBA1C 6.2 07/02/2015   Improved with 15 lb wt loss Enc to keep going to wt watchers and plan an exercise program  Will call if she needs new monitoring supplies microalb and opthy appts are up to date        Other   Toe contusion    R 2nd toe- after stubbing it  Swelling and ecchymosis are improving with time Disc buddy taping to 1st toe  Alert if inc in pain  Likely xray would not change management and pt would like to avoid it      Morbid obesity ([place])    BMI is now down to 77! - commended  Enc her to continue wt watchers Discussed how this problem influences overall health and the risks it imposes  Reviewed plan for weight loss with lower calorie diet (via better food choices and also portion control or program like weight watchers) and exercise building up to or more than 30 minutes 5 days per week including some aerobic activity   Down 15 lb so far!

## 2015-07-16 NOTE — Assessment & Plan Note (Signed)
bp in fair control at this time  BP Readings from Last 1 Encounters:  07/16/15 128/76   No changes needed Disc lifstyle change with low sodium diet and exercise  Labs are improved  Enc further wt loss and commended

## 2015-07-16 NOTE — Assessment & Plan Note (Signed)
BMI is now down to 13! - commended  Enc her to continue wt watchers Discussed how this problem influences overall health and the risks it imposes  Reviewed plan for weight loss with lower calorie diet (via better food choices and also portion control or program like weight watchers) and exercise building up to or more than 30 minutes 5 days per week including some aerobic activity   Down 15 lb so far!

## 2015-08-19 ENCOUNTER — Other Ambulatory Visit: Payer: Self-pay | Admitting: Family Medicine

## 2015-12-23 NOTE — Progress Notes (Signed)
[facility] Consultation      Assessment and Plan:  Severe obstructive sleep apnea. -Patient was initially diagnosed with sleep apnea on a home titration test, and underwent an in lab bipap titration. She has tried an auto-CPAP titration per previous notes--did not tolerate CPAP at that time.  --On Bipap 19/14;  her BiPAP titration was suboptimal, therefore she was started on his BiPAP level empirically. She appears to be doing well on it, she used to wake up gasping in the middle the night while on her auto CPAP, she no longer does this on current BiPAP level.  -patient continues to move to the recliner every night, I encouraged her to place her old CPAP next to her recliner, and use that while she is sleeping.  Ulcerative colitis. -Patient has to empty her colostomy once per evening, this is time consuming, and interrupts with her sleep.  Referring Physician:   [name] is a 60 y.o. old female seen in consultation for chief complaint of:    Chief Complaint  Patient presents with  . Follow-up    1 yr f/u for sleep: feels she does ok with BiPAP:     HPI:  She has been using cpap but feels that she has continued sleepiness. She put the mask on at 11 pm, her husband also wears a cpap. She falls asleep within about 10 min. She sleeps until 2 to 3 am spontaneously.  She still is not sleeping in the bed for the night, she spends about 4 hours in bed before she moves to her recliner. She wakes up at night to go to the bathroom to empty her ileostomy, this is somewhat time-consuming. She then moved to her recliner where she does not use Pap.   Review of download data shows residual AHI of 4.6; on a pressure of 19/10.   -home sleep study from the December 20th 2012:AHI 37.  No flowsheet data found.  Pulmonary Functions Testing Results:  No results found for: FEV1, FVC, FEV1FVC, TLC, DLCO Medication:       Allergies:  Lisinopril; Ciprofloxacin; and Sulfa  antibiotics  Review of Systems: Gen:  Denies  fever, sweats, chills HEENT: Denies blurred vision, double vision. bleeds, sore throat Cvc:  No dizziness, chest pain. Resp:   Denies cough or sputum porduction, shortness of breath Gi: Denies swallowing difficulty, stomach pain. Gu:  Denies bladder incontinence, burning urine Ext:   No Joint pain, stiffness. Skin: No skin rash,  hives Endoc:  No polyuria, polydipsia. Psych: No depression, insomnia. Other:  All other systems were reviewed with the patient and were negative other that what is mentioned in the HPI.   Physical Examination:   VS: BP 132/78 (BP Location: Left Arm, Cuff Size: Normal)   Pulse 78   Wt 245 lb (111.1 kg)   SpO2 96%   BMI 40.77 kg/m   General Appearance: No distress  Neuro:without focal findings,  speech normal,  HEENT: PERRLA, EOM intact.   Pulmonary: normal breath sounds, No wheezing.  CardiovascularNormal S1,S2.  No m/r/g.   Abdomen: Benign, Soft, non-tender. Ostomy in place in right side.  Renal:  No costovertebral tenderness  GU:  No performed at this time. Endoc: No evident thyromegaly, no signs of acromegaly. Skin:   warm, no rashes, no ecchymosis  Extremities: normal, no cyanosis, clubbing.  Other findings:    LABORATORY PANEL:   CBC No results for input(s): WBC, HGB, HCT, PLT in the last 168 hours. ------------------------------------------------------------------------------------------------------------------  Chemistries  No results for input(s): NA, K, CL, CO2, GLUCOSE, BUN, CREATININE, CALCIUM, MG, AST, ALT, ALKPHOS, BILITOT in the last 168 hours.  Invalid input(s): GFRCGP ------------------------------------------------------------------------------------------------------------------  Cardiac Enzymes No results for input(s): TROPONINI in the last 168 hours. ------------------------------------------------------------  RADIOLOGY:  No results found.     Thank  you for the  consultation and for allowing [facility] to assist in the care of your patient. Our recommendations are noted above.  Please contact us if we can be of further service.   Jamie Stalker, MD.  Board Certified in Internal Medicine, Pulmonary Medicine, [place], and Sleep Medicine.  [facility] Office Number: [phone]  Jamie [name], M.D.  Jamie [name], M.D.  Jamie [name], M.D

## 2015-12-24 ENCOUNTER — Encounter: Payer: Self-pay | Admitting: Internal Medicine

## 2015-12-24 ENCOUNTER — Ambulatory Visit (INDEPENDENT_AMBULATORY_CARE_PROVIDER_SITE_OTHER): Payer: BC Managed Care – PPO | Admitting: Internal Medicine

## 2015-12-24 VITALS — BP 132/78 | HR 78 | Wt 245.0 lb

## 2015-12-24 DIAGNOSIS — K51919 Ulcerative colitis, unspecified with unspecified complications: Secondary | ICD-10-CM | POA: Diagnosis not present

## 2015-12-24 DIAGNOSIS — G4733 Obstructive sleep apnea (adult) (pediatric): Secondary | ICD-10-CM

## 2015-12-24 NOTE — Patient Instructions (Addendum)
--  Continue to use your BiPAP every night, for as long as possible during the night.  --Try putting your old CPAP next to your recliner and sleep with it on.

## 2016-01-02 ENCOUNTER — Telehealth: Payer: Self-pay | Admitting: Family Medicine

## 2016-01-02 DIAGNOSIS — Z0000 Encounter for general adult medical examination without abnormal findings: Secondary | ICD-10-CM

## 2016-01-02 DIAGNOSIS — E119 Type 2 diabetes mellitus without complications: Secondary | ICD-10-CM

## 2016-01-02 NOTE — Telephone Encounter (Signed)
-----   Message from Ellamae Sia sent at 12/30/2015  3:49 PM EST ----- Regarding: Lab orders for Thursday, 12.14.17 Patient is scheduled for CPX labs, please order future labs, Thanks , Karna Christmas

## 2016-01-06 ENCOUNTER — Other Ambulatory Visit (INDEPENDENT_AMBULATORY_CARE_PROVIDER_SITE_OTHER): Payer: BC Managed Care – PPO

## 2016-01-06 DIAGNOSIS — E119 Type 2 diabetes mellitus without complications: Secondary | ICD-10-CM | POA: Diagnosis not present

## 2016-01-06 DIAGNOSIS — R7989 Other specified abnormal findings of blood chemistry: Secondary | ICD-10-CM

## 2016-01-06 DIAGNOSIS — Z0000 Encounter for general adult medical examination without abnormal findings: Secondary | ICD-10-CM

## 2016-01-06 LAB — COMPREHENSIVE METABOLIC PANEL
ALK PHOS: 76 U/L (ref 39–117)
ALT: 26 U/L (ref 0–35)
AST: 33 U/L (ref 0–37)
Albumin: 3.9 g/dL (ref 3.5–5.2)
BILIRUBIN TOTAL: 0.7 mg/dL (ref 0.2–1.2)
BUN: 14 mg/dL (ref 6–23)
CALCIUM: 9.4 mg/dL (ref 8.4–10.5)
CO2: 26 mEq/L (ref 19–32)
CREATININE: 1.11 mg/dL (ref 0.40–1.20)
Chloride: 104 mEq/L (ref 96–112)
GFR: 53.01 mL/min — AB (ref >60.00)
GLUCOSE: 153 mg/dL — AB (ref 70–99)
Potassium: 4.4 mEq/L (ref 3.5–5.1)
Sodium: 136 mEq/L (ref 135–145)
TOTAL PROTEIN: 7.1 g/dL (ref 6.0–8.3)

## 2016-01-06 LAB — LIPID PANEL
CHOLESTEROL: 159 mg/dL (ref 0–200)
HDL: 50 mg/dL (ref >39.00)
NONHDL: 108.97
TRIGLYCERIDES: 218 mg/dL — AB (ref 0.0–149.0)
Total CHOL/HDL Ratio: 3
VLDL: 43.6 mg/dL — ABNORMAL HIGH (ref 0.0–40.0)

## 2016-01-06 LAB — TSH: TSH: 2.92 u[IU]/mL (ref 0.35–4.50)

## 2016-01-06 LAB — HEMOGLOBIN A1C: Hgb A1c MFr Bld: 6.7 % — ABNORMAL HIGH (ref 4.6–6.5)

## 2016-01-06 LAB — LDL CHOLESTEROL, DIRECT: LDL DIRECT: 88 mg/dL

## 2016-01-07 LAB — CBC WITH DIFFERENTIAL/PLATELET
BASOS ABS: 0 10*3/uL (ref 0.0–0.1)
Basophils Relative: 0.5 % (ref 0.0–3.0)
Eosinophils Absolute: 0.4 10*3/uL (ref 0.0–0.7)
Eosinophils Relative: 5.3 % — ABNORMAL HIGH (ref 0.0–5.0)
HEMATOCRIT: 42.2 % (ref 36.0–46.0)
HEMOGLOBIN: 14.4 g/dL (ref 12.0–15.0)
LYMPHS PCT: 22.1 % (ref 12.0–46.0)
Lymphs Abs: 1.9 10*3/uL (ref 0.7–4.0)
MCHC: 34.1 g/dL (ref 30.0–36.0)
MCV: 92.4 fl (ref 78.0–100.0)
Monocytes Absolute: 0.4 10*3/uL (ref 0.1–1.0)
Monocytes Relative: 4.7 % (ref 3.0–12.0)
Neutro Abs: 5.7 10*3/uL (ref 1.4–7.7)
Neutrophils Relative %: 67.4 % (ref 43.0–77.0)
Platelets: 299 10*3/uL (ref 150.0–400.0)
RBC: 4.57 Mil/uL (ref 3.87–5.11)
RDW: 13.2 % (ref 11.5–15.5)
WBC: 8.4 10*3/uL (ref 4.0–10.5)

## 2016-01-10 ENCOUNTER — Ambulatory Visit (INDEPENDENT_AMBULATORY_CARE_PROVIDER_SITE_OTHER): Payer: BC Managed Care – PPO | Admitting: Family Medicine

## 2016-01-10 ENCOUNTER — Encounter: Payer: Self-pay | Admitting: Family Medicine

## 2016-01-10 VITALS — BP 136/78 | HR 65 | Temp 97.8°F | Ht 65.5 in | Wt 243.2 lb

## 2016-01-10 DIAGNOSIS — Z0000 Encounter for general adult medical examination without abnormal findings: Secondary | ICD-10-CM

## 2016-01-10 DIAGNOSIS — E119 Type 2 diabetes mellitus without complications: Secondary | ICD-10-CM

## 2016-01-10 DIAGNOSIS — G4733 Obstructive sleep apnea (adult) (pediatric): Secondary | ICD-10-CM

## 2016-01-10 DIAGNOSIS — K51019 Ulcerative (chronic) pancolitis with unspecified complications: Secondary | ICD-10-CM | POA: Diagnosis not present

## 2016-01-10 DIAGNOSIS — I10 Essential (primary) hypertension: Secondary | ICD-10-CM | POA: Diagnosis not present

## 2016-01-10 LAB — MICROALBUMIN / CREATININE URINE RATIO
CREATININE, U: 167.8 mg/dL
MICROALB/CREAT RATIO: 0.4 mg/g (ref 0.0–30.0)

## 2016-01-10 MED ORDER — METOPROLOL TARTRATE 25 MG PO TABS: ORAL_TABLET | ORAL | 3 refills | Status: DC

## 2016-01-10 NOTE — Assessment & Plan Note (Signed)
Discussed how this problem influences overall health and the risks it imposes  Reviewed plan for weight loss with lower calorie diet (via better food choices and also portion control or program like weight watchers) and exercise building up to or more than 30 minutes 5 days per week including some aerobic activity   Glad to hear she is getting back on track after travel

## 2016-01-10 NOTE — Assessment & Plan Note (Signed)
bp in fair control at this time  BP Readings from Last 1 Encounters:  01/10/16 136/78   No changes needed Disc lifstyle change with low sodium diet and exercise  Labs reviewed  Wt loss enc

## 2016-01-10 NOTE — Assessment & Plan Note (Signed)
Reviewed health habits including diet and exercise and skin cancer prevention Reviewed appropriate screening tests for age  Also reviewed health mt list, fam hx and immunization status , as well as social and family history   See HPI Labs reviewed Enc wt loss  microalb today imms utd Eye exam utd  Nl foot exam  She will schedule own mammogram for next mo

## 2016-01-10 NOTE — Progress Notes (Signed)
Subjective:    Patient ID: Jamie [name], female    DOB: 1955-08-10, 60 y.o.   MRN: [id number]  HPI Here for health maintenance exam and to review chronic medical problems    Very busy lately  Feeling good overall  Taking fair care of herself   Wt Readings from Last 3 Encounters:  01/10/16 243 lb 4 oz (110.3 kg)  12/24/15 245 lb (111.1 kg)  07/16/15 234 lb 12 oz (106.5 kg)  went to [place] and [place] in the fall- gained some weight back  Getting back to her regular diet  Walked/exercised more when traveling  No for exercise she uses elliptical machine 30 min almost every day  bmi is 39.8  Eye exam -6/17 No retinopathy   Flu shot 9/17  Mammogram 1/17-normal Self breast exam no lumps   PNA vaccine 11/15  Tetanus shot 9/14  Zoster vaccine 12/16  Has had a colectomy-no need for colonoscopy Hx of colitis -no problems  Keeping skin healthy under colostomy bag is a challenge (does not take much to get it worse)  Has also had a total hysterectomy in the past   bp is stable today  No cp or palpitations or headaches or edema  No side effects to medicines  BP Readings from Last 3 Encounters:  01/10/16 136/78  12/24/15 132/78  07/16/15 128/76     DM2 Lab Results  Component Value Date   HGBA1C 6.7 (H) 01/06/2016   This is up from 6.2 Pos due to vacation eating and out of routine Getting back on track  Going to the beach and plans to walk a lot as well after the holidays   Glucose fasting 153   Chemistry      Component Value Date/Time   NA 136 01/06/2016 0923   NA 137 01/09/2014 1613   K 4.4 01/06/2016 0923   K 4.3 01/09/2014 1613   CL 104 01/06/2016 0923   CL 105 01/09/2014 1613   CO2 26 01/06/2016 0923   CO2 22 01/09/2014 1613   BUN 14 01/06/2016 0923   BUN 13 01/09/2014 1613   CREATININE 1.11 01/06/2016 0923   CREATININE 1.08 01/09/2014 1613      Component Value Date/Time   CALCIUM 9.4 01/06/2016 0923   CALCIUM 9.4 01/09/2014 1613   ALKPHOS 76  01/06/2016 0923   ALKPHOS 100 01/09/2014 1613   AST 33 01/06/2016 0923   AST 43 (H) 01/09/2014 1613   ALT 26 01/06/2016 0923   ALT 55 01/09/2014 1613   BILITOT 0.7 01/06/2016 0923   BILITOT 0.4 01/09/2014 1613      Cholesterol  Lab Results  Component Value Date   CHOL 159 01/06/2016   CHOL 167 01/05/2015   CHOL 180 11/28/2013   Lab Results  Component Value Date   HDL 50.00 01/06/2016   HDL 52.10 01/05/2015   HDL 39.70 11/28/2013   Lab Results  Component Value Date   LDLCALC 76 01/05/2015   LDLCALC 65 09/25/2012   [place] 90 09/26/2011   Lab Results  Component Value Date   TRIG 218.0 (H) 01/06/2016   TRIG 191.0 (H) 01/05/2015   TRIG 219.0 (H) 11/28/2013   Lab Results  Component Value Date   CHOLHDL 3 01/06/2016   CHOLHDL 3 01/05/2015   CHOLHDL 5 11/28/2013   Lab Results  Component Value Date   LDLDIRECT 88.0 01/06/2016   LDLDIRECT 100.1 11/28/2013   LDLDIRECT 110.3 04/25/2010   Glucose is up so trig are as well  Patient Active Problem List   Diagnosis Date Noted  . Toe contusion 07/16/2015  . Morbid obesity ([place]) 01/08/2015  . Elevated transaminase level 12/09/2013  . Red blood cell antibody positive 07/02/2013  . Abnormal urine 05/15/2012  . Elevated C-reactive protein (CRP) 05/15/2012  . Stricture of anal canal 11/21/2011  . Rectal discharge 10/02/2011  . Routine general medical examination at a health care facility 09/24/2011  . Apnea 11/14/2010  . Neck pain 11/14/2010  . Nevus of lower leg 11/14/2010  . Other screening mammogram 10/31/2010  . Left sided abdominal pain 09/08/2010  . Stress reaction, emotional 05/02/2010  . Compulsive overeater 05/02/2010  . Arthritis   . Bowel obstruction   . KNEE PAIN 10/14/2009  . HOARSENESS 07/21/2009  . Diabetes type 2, controlled ([place]) 04/23/2009  . ROSACEA 09/12/2007  . Essential hypertension 05/15/2007  . OSA (obstructive sleep apnea) 12/13/2006  . Ulcerative colitis ([place]) 07/12/2006   Past  Medical History:  Diagnosis Date  . Arthritis    generalized  . Bowel obstruction    repeated (? possible adhesions) neg EGD  . Colitis    with colectomy  . Gall stones 2008  . Gastroenteritis 07/25/14   hosp for IVF   . HTN (hypertension)   . Hyperglycemia    mild-monitors A1C  . Obesity   . Rosacea   . Sleep apnea    CPAP everynight   Past Surgical History:  Procedure Laterality Date  . CHOLECYSTECTOMY    . COLECTOMY  1993   has ileostomy  . COLOSTOMY REVISION  12/06/2011   Procedure: COLOSTOMY REVISION;  Surgeon: Leighton Ruff, MD;  Location: [facility];  Service: General;  Laterality: Right;  OSTOMY revision  . EXAMINATION UNDER ANESTHESIA  12/06/2011   Procedure: EXAM UNDER ANESTHESIA;  Surgeon: Leighton Ruff, MD;  Location: [facility];  Service: General;  Laterality: N/A;  rectal exam under anesthesia, anal dilation, pouchoscopy    . TOTAL ABDOMINAL HYSTERECTOMY  05/2006   for abscessed ovaries   Social History  Substance Use Topics  . Smoking status: Never Smoker  . Smokeless tobacco: Never Used  . Alcohol use No   Family History  Problem Relation Age of Onset  . Cancer Father     colon  . Liver cancer Father     resection secondary to mets  . Hyperlipidemia Father   . Hypertension Father   . Allergies Father   . Ulcerative colitis Father   . Hypertension Mother   . Hyperlipidemia Mother   . Cirrhosis Mother     NASH  . Diabetes Mother   . Allergies Brother   . Breast cancer Paternal Grandmother   . Breast cancer Cousin 67    paternal cousins   Allergies  Allergen Reactions  . Lisinopril     REACTION: felt bad/ stomach hurt/ weak and tired  . Ciprofloxacin Rash    Erythema and pruritis around IV site, erythema and rash along vein  . Sulfa Antibiotics Rash   Current Outpatient Prescriptions on File Prior to Visit  Medication Sig Dispense Refill  . Blood Glucose Monitoring Suppl (GLUCOMETER ELITE CLASSIC) KIT by  Does not apply route daily. Check sugar once daily and as needed for DM2 250.0     . CINNAMON PO Take 1,000 mg by mouth daily.    . fluticasone (FLOVENT HFA) 220 MCG/ACT inhaler APPLY 2 PUFFS TOPICALLY TO CHOLOSTOMY SITE EVERY DAY AS DIRECTED 12 g 5  . glucose blood test strip  1 each by Other route daily. Reported on 07/16/2015    . hyoscyamine (LEVSIN SL) 0.125 MG SL tablet Take 1 tablet (0.125 mg total) by mouth every 4 (four) hours as needed. For abdominal cramps 30 tablet 11  . ibuprofen (ADVIL,MOTRIN) 200 MG tablet Take 200 mg by mouth every 6 (six) hours as needed (for arthritis).     . Multiple Vitamin (MULTIVITAMIN) tablet Take 1 tablet by mouth daily.      . naproxen sodium (ANAPROX) 220 MG tablet Take 220 mg by mouth daily.     . NON FORMULARY SLEEPS WITH BI-PAP    . omeprazole (PRILOSEC) 20 MG capsule Take 20 mg by mouth every other day.     . ondansetron (ZOFRAN ODT) 4 MG disintegrating tablet Take 1 tablet (4 mg total) by mouth every 4 (four) hours as needed for nausea or vomiting. 20 tablet 0  . zolpidem (AMBIEN) 10 MG tablet Take 0.5-1 tablets (5-10 mg total) by mouth at bedtime as needed for sleep. TAKE 1 TABLET BY MOUTH AT BEDTIME AS NEEDED 30 tablet 0   No current facility-administered medications on file prior to visit.      Review of Systems Review of Systems  Constitutional: Negative for fever, appetite change, fatigue and unexpected weight change.  Eyes: Negative for pain and visual disturbance.  Respiratory: Negative for cough and shortness of breath.   Cardiovascular: Negative for cp or palpitations    Gastrointestinal: Negative for nausea, diarrhea and constipation.  Genitourinary: Negative for urgency and frequency.  Skin: Negative for pallor or rash  pos for occ skin irritation under ostomy Neurological: Negative for weakness, light-headedness, numbness and headaches.  Hematological: Negative for adenopathy. Does not bruise/bleed easily.  Psychiatric/Behavioral:  Negative for dysphoric mood. The patient is not nervous/anxious.         Objective:   Physical Exam  Constitutional: She appears well-developed and well-nourished. No distress.  obese and well appearing   HENT:  Head: Normocephalic and atraumatic.  Right Ear: External ear normal.  Left Ear: External ear normal.  Mouth/Throat: Oropharynx is clear and moist.  Eyes: Conjunctivae and EOM are normal. Pupils are equal, round, and reactive to light. No scleral icterus.  Neck: Normal range of motion. Neck supple. No JVD present. Carotid bruit is not present. No thyromegaly present.  Cardiovascular: Normal rate, regular rhythm, normal heart sounds and intact distal pulses.  Exam reveals no gallop.   Pulmonary/Chest: Effort normal and breath sounds normal. No respiratory distress. She has no wheezes. She exhibits no tenderness.  Abdominal: Soft. Bowel sounds are normal. She exhibits no distension, no abdominal bruit and no mass. There is no tenderness. There is no rebound and no guarding.  Ostomy site is clean w/o irritation today  Genitourinary: No breast swelling, tenderness, discharge or bleeding.  Genitourinary Comments: Breast exam: No mass, nodules, thickening, tenderness, bulging, retraction, inflamation, nipple discharge or skin changes noted.  No axillary or clavicular LA.      Musculoskeletal: Normal range of motion. She exhibits no edema or tenderness.  Lymphadenopathy:    She has no cervical adenopathy.  Neurological: She is alert. She has normal reflexes. No cranial nerve deficit. She exhibits normal muscle tone. Coordination normal.  Skin: Skin is warm and dry. No rash noted. No erythema. No pallor.  Psychiatric: She has a normal mood and affect.          Assessment & Plan:   Problem List Items Addressed This Visit      Cardiovascular  and Mediastinum   Essential hypertension    bp in fair control at this time  BP Readings from Last 1 Encounters:  01/10/16 136/78   No  changes needed Disc lifstyle change with low sodium diet and exercise  Labs reviewed  Wt loss enc      Relevant Medications   metoprolol tartrate (LOPRESSOR) 25 MG tablet     Respiratory   OSA (obstructive sleep apnea)    Doing well with current pressures         Digestive   Ulcerative colitis ([place])    occ skin problems with the colostomy bag-otherwise doing well         Endocrine   Diabetes type 2, controlled ([place])    Lab Results  Component Value Date   HGBA1C 6.7 (H) 01/06/2016   Up with traveling Disc getting back on track with diet and exercise and wt loss F/u 3 mo with lab prior  microalbumin today      Relevant Orders   Microalbumin / creatinine urine ratio (Completed)     Other   Routine general medical examination at a health care facility - Primary    Reviewed health habits including diet and exercise and skin cancer prevention Reviewed appropriate screening tests for age  Also reviewed health mt list, fam hx and immunization status , as well as social and family history   See HPI Labs reviewed Enc wt loss  microalb today imms utd Eye exam utd  Nl foot exam  She will schedule own mammogram for next mo       Morbid obesity ([place])    Discussed how this problem influences overall health and the risks it imposes  Reviewed plan for weight loss with lower calorie diet (via better food choices and also portion control or program like weight watchers) and exercise building up to or more than 30 minutes 5 days per week including some aerobic activity   Glad to hear she is getting back on track after travel

## 2016-01-10 NOTE — Patient Instructions (Addendum)
Your mammogram is due next month- don't forget to schedule it  Take care of yourself  Get back to regular diet and exercise  Keep working on weight loss   Follow up in 3 months for visit and labs prior

## 2016-01-10 NOTE — Progress Notes (Signed)
Pre visit review using our clinic review tool, if applicable. No additional management support is needed unless otherwise documented below in the visit note. 

## 2016-01-10 NOTE — Assessment & Plan Note (Signed)
Lab Results  Component Value Date   HGBA1C 6.7 (H) 01/06/2016   Up with traveling Disc getting back on track with diet and exercise and wt loss F/u 3 mo with lab prior  microalbumin today

## 2016-01-10 NOTE — Assessment & Plan Note (Signed)
occ skin problems with the colostomy bag-otherwise doing well

## 2016-01-10 NOTE — Assessment & Plan Note (Signed)
Doing well with current pressures

## 2016-01-11 ENCOUNTER — Other Ambulatory Visit: Payer: Self-pay | Admitting: Family Medicine

## 2016-01-11 DIAGNOSIS — Z1231 Encounter for screening mammogram for malignant neoplasm of breast: Secondary | ICD-10-CM

## 2016-02-11 ENCOUNTER — Ambulatory Visit
Admission: RE | Admit: 2016-02-11 | Discharge: 2016-02-11 | Disposition: A | Discharge status: Home / Self Care | Payer: BC Managed Care – PPO | Source: Ambulatory Visit | Attending: Family Medicine | Admitting: Family Medicine

## 2016-02-11 DIAGNOSIS — Z1231 Encounter for screening mammogram for malignant neoplasm of breast: Secondary | ICD-10-CM | POA: Insufficient documentation

## 2016-03-13 ENCOUNTER — Encounter: Payer: Self-pay | Admitting: Family Medicine

## 2016-04-04 ENCOUNTER — Other Ambulatory Visit: Payer: BC Managed Care – PPO

## 2016-04-05 ENCOUNTER — Other Ambulatory Visit (INDEPENDENT_AMBULATORY_CARE_PROVIDER_SITE_OTHER): Payer: BC Managed Care – PPO

## 2016-04-05 DIAGNOSIS — E119 Type 2 diabetes mellitus without complications: Secondary | ICD-10-CM

## 2016-04-05 DIAGNOSIS — I10 Essential (primary) hypertension: Secondary | ICD-10-CM | POA: Diagnosis not present

## 2016-04-05 LAB — MICROALBUMIN / CREATININE URINE RATIO
Creatinine,U: 118.4 mg/dL
Microalb Creat Ratio: 0.6 mg/g (ref 0.0–30.0)
Microalb, Ur: <0.7 mg/dL (ref 0.0–1.9)

## 2016-04-05 LAB — LIPID PANEL
CHOL/HDL RATIO: 4
Cholesterol: 179 mg/dL (ref 0–200)
HDL: 48.9 mg/dL (ref >39.00)
NONHDL: 129.8
TRIGLYCERIDES: 247 mg/dL — AB (ref 0.0–149.0)
VLDL: 49.4 mg/dL — ABNORMAL HIGH (ref 0.0–40.0)

## 2016-04-05 LAB — LDL CHOLESTEROL, DIRECT: LDL DIRECT: 89 mg/dL

## 2016-04-05 LAB — HEMOGLOBIN A1C: Hgb A1c MFr Bld: 7 % — ABNORMAL HIGH (ref 4.6–6.5)

## 2016-04-10 ENCOUNTER — Encounter: Payer: Self-pay | Admitting: Family Medicine

## 2016-04-10 ENCOUNTER — Ambulatory Visit (INDEPENDENT_AMBULATORY_CARE_PROVIDER_SITE_OTHER): Payer: BC Managed Care – PPO | Admitting: Family Medicine

## 2016-04-10 VITALS — BP 122/78 | HR 80 | Temp 98.5°F | Ht 65.5 in | Wt 248.5 lb

## 2016-04-10 DIAGNOSIS — E781 Pure hyperglyceridemia: Secondary | ICD-10-CM | POA: Diagnosis not present

## 2016-04-10 DIAGNOSIS — E1169 Type 2 diabetes mellitus with other specified complication: Secondary | ICD-10-CM | POA: Insufficient documentation

## 2016-04-10 DIAGNOSIS — I10 Essential (primary) hypertension: Secondary | ICD-10-CM | POA: Diagnosis not present

## 2016-04-10 DIAGNOSIS — E785 Hyperlipidemia, unspecified: Secondary | ICD-10-CM

## 2016-04-10 DIAGNOSIS — E119 Type 2 diabetes mellitus without complications: Secondary | ICD-10-CM

## 2016-04-10 MED ORDER — METFORMIN HCL 500 MG PO TABS: 500.0000 mg | ORAL_TABLET | Freq: Two times a day (BID) | ORAL | 11 refills | Status: DC

## 2016-04-10 NOTE — Assessment & Plan Note (Signed)
Lab Results  Component Value Date   HGBA1C 7.0 (H) 04/05/2016   This is up  Will begin metformin for glucose control and also to help loose wt Rev diet and exercise plan in detail  Better glucose control would also help trig levels Disc eye/foot care f/u 3 mo

## 2016-04-10 NOTE — Patient Instructions (Addendum)
Aim for at least 30 minutes of exercise per day (5 days per week or more)  Start metformin twice daily  If any problems or side effects please alert me  Check glucose once daily and different times   Try to limit refined carbohydrates /sugar/and also fatty foods   Follow up in 3 months with lab prior

## 2016-04-10 NOTE — Assessment & Plan Note (Signed)
Enc lower carb diet for this Disc goals for lipids and reasons to control them Rev labs with pt Rev low sat fat diet in detail

## 2016-04-10 NOTE — Assessment & Plan Note (Signed)
Discussed how this problem influences overall health and the risks it imposes  Reviewed plan for weight loss with lower calorie diet (via better food choices and also portion control or program like weight watchers) and exercise building up to or more than 30 minutes 5 days per week including some aerobic activity    

## 2016-04-10 NOTE — Progress Notes (Signed)
Subjective:    Patient ID: Jamie [name], female    DOB: Jan 10, 1956, 61 y.o.   MRN: [id number]  HPI Here for f/u of chronic medical problems   Wt Readings from Last 3 Encounters:  04/10/16 248 lb 8 oz (112.7 kg)  01/10/16 243 lb 4 oz (110.3 kg)  12/24/15 245 lb (111.1 kg)  up 5 lb  She started back to weight watchers again  She lost her motivation after the holidays  Some exercise-not regular - likes to walk and use gazelle machine at home  bmi 40.7  bp is stable today  No cp or palpitations or headaches or edema  No side effects to medicines  BP Readings from Last 3 Encounters:  04/10/16 122/78  01/10/16 136/78  12/24/15 132/78     Diabetes Home sugar results - she can see what makes her blood sugar go up  Checks once daily - different times Lowest 111  Controlling amt of the foods she likes- this is a problem  DM diet -not good prior to now  Exercise -not regular  Symptoms-none  A1C last  Lab Results  Component Value Date   HGBA1C 7.0 (H) 04/05/2016  this is up from 6.7  No problems with medications  Renal protection  Lab Results  Component Value Date   MICROALBUR <0.7 04/05/2016   Last eye exam 6/17 She is open to taking medication    Cholesterol  Lab Results  Component Value Date   CHOL 179 04/05/2016   CHOL 159 01/06/2016   CHOL 167 01/05/2015   Lab Results  Component Value Date   HDL 48.90 04/05/2016   HDL 50.00 01/06/2016   HDL 52.10 01/05/2015   Lab Results  Component Value Date   LDLCALC 76 01/05/2015   LDLCALC 65 09/25/2012   [place] 90 09/26/2011   Lab Results  Component Value Date   TRIG 247.0 (H) 04/05/2016   TRIG 218.0 (H) 01/06/2016   TRIG 191.0 (H) 01/05/2015   Lab Results  Component Value Date   CHOLHDL 4 04/05/2016   CHOLHDL 3 01/06/2016   CHOLHDL 3 01/05/2015   Lab Results  Component Value Date   LDLDIRECT 89.0 04/05/2016   LDLDIRECT 88.0 01/06/2016   LDLDIRECT 100.1 11/28/2013   Eating poorly  Trig up  likely from her diabetes   Patient Active Problem List   Diagnosis Date Noted  . Hypertriglyceridemia 04/10/2016  . Toe contusion 07/16/2015  . Morbid obesity ([place]) 01/08/2015  . Elevated transaminase level 12/09/2013  . Red blood cell antibody positive 07/02/2013  . Abnormal urine 05/15/2012  . Elevated C-reactive protein (CRP) 05/15/2012  . Stricture of anal canal 11/21/2011  . Rectal discharge 10/02/2011  . Routine general medical examination at a health care facility 09/24/2011  . Apnea 11/14/2010  . Neck pain 11/14/2010  . Nevus of lower leg 11/14/2010  . Other screening mammogram 10/31/2010  . Stress reaction, emotional 05/02/2010  . Compulsive overeater 05/02/2010  . Arthritis   . Bowel obstruction   . KNEE PAIN 10/14/2009  . HOARSENESS 07/21/2009  . Diabetes type 2, controlled ([place]) 04/23/2009  . ROSACEA 09/12/2007  . Essential hypertension 05/15/2007  . OSA (obstructive sleep apnea) 12/13/2006  . Ulcerative colitis ([place]) 07/12/2006   Past Medical History:  Diagnosis Date  . Arthritis    generalized  . Bowel obstruction    repeated (? possible adhesions) neg EGD  . Colitis    with colectomy  . Gall stones 2008  . Gastroenteritis 07/25/14  hosp for IVF   . HTN (hypertension)   . Hyperglycemia    mild-monitors A1C  . Obesity   . Rosacea   . Sleep apnea    CPAP everynight   Past Surgical History:  Procedure Laterality Date  . CHOLECYSTECTOMY    . COLECTOMY  1993   has ileostomy  . COLOSTOMY REVISION  12/06/2011   Procedure: COLOSTOMY REVISION;  Surgeon: Leighton Ruff, MD;  Location: [facility];  Service: General;  Laterality: Right;  OSTOMY revision  . EXAMINATION UNDER ANESTHESIA  12/06/2011   Procedure: EXAM UNDER ANESTHESIA;  Surgeon: Leighton Ruff, MD;  Location: [facility];  Service: General;  Laterality: N/A;  rectal exam under anesthesia, anal dilation, pouchoscopy    . TOTAL ABDOMINAL HYSTERECTOMY  05/2006   for  abscessed ovaries   Social History  Substance Use Topics  . Smoking status: Never Smoker  . Smokeless tobacco: Never Used  . Alcohol use No   Family History  Problem Relation Age of Onset  . Cancer Father     colon  . Liver cancer Father     resection secondary to mets  . Hyperlipidemia Father   . Hypertension Father   . Allergies Father   . Ulcerative colitis Father   . Hypertension Mother   . Hyperlipidemia Mother   . Cirrhosis Mother     NASH  . Diabetes Mother   . Allergies Brother   . Breast cancer Paternal Grandmother   . Breast cancer Cousin 53    paternal cousins   Allergies  Allergen Reactions  . Lisinopril     REACTION: felt bad/ stomach hurt/ weak and tired  . Ciprofloxacin Rash    Erythema and pruritis around IV site, erythema and rash along vein  . Sulfa Antibiotics Rash   Current Outpatient Prescriptions on File Prior to Visit  Medication Sig Dispense Refill  . AZO-CRANBERRY PO Take 2 capsules by mouth daily.    . Blood Glucose Monitoring Suppl (GLUCOMETER ELITE CLASSIC) KIT by Does not apply route daily. Check sugar once daily and as needed for DM2 250.0     . CINNAMON PO Take 1,000 mg by mouth daily.    . fluticasone (FLOVENT HFA) 220 MCG/ACT inhaler APPLY 2 PUFFS TOPICALLY TO CHOLOSTOMY SITE EVERY DAY AS DIRECTED 12 g 5  . glucose blood test strip 1 each by Other route daily. Reported on 07/16/2015    . hyoscyamine (LEVSIN SL) 0.125 MG SL tablet Take 1 tablet (0.125 mg total) by mouth every 4 (four) hours as needed. For abdominal cramps 30 tablet 11  . ibuprofen (ADVIL,MOTRIN) 200 MG tablet Take 200 mg by mouth every 6 (six) hours as needed (for arthritis).     . metoprolol tartrate (LOPRESSOR) 25 MG tablet TAKE 1 TABLET (25 MG TOTAL) BY MOUTH DAILY. 90 tablet 3  . Multiple Vitamin (MULTIVITAMIN) tablet Take 1 tablet by mouth daily.      . naproxen sodium (ANAPROX) 220 MG tablet Take 220 mg by mouth daily.     . NON FORMULARY SLEEPS WITH BI-PAP    .  omeprazole (PRILOSEC) 20 MG capsule Take 20 mg by mouth every other day.     . ondansetron (ZOFRAN ODT) 4 MG disintegrating tablet Take 1 tablet (4 mg total) by mouth every 4 (four) hours as needed for nausea or vomiting. 20 tablet 0  . zolpidem (AMBIEN) 10 MG tablet Take 0.5-1 tablets (5-10 mg total) by mouth at bedtime as needed for  sleep. TAKE 1 TABLET BY MOUTH AT BEDTIME AS NEEDED 30 tablet 0   No current facility-administered medications on file prior to visit.     Review of Systems Review of Systems  Constitutional: Negative for fever, appetite change, fatigue and unexpected weight change.  Eyes: Negative for pain and visual disturbance.  Respiratory: Negative for cough and shortness of breath.   Cardiovascular: Negative for cp or palpitations    Gastrointestinal: Negative for nausea, diarrhea and constipation.  Genitourinary: Negative for urgency and frequency.  Skin: Negative for pallor or rash   Neurological: Negative for weakness, light-headedness, numbness and headaches.  Hematological: Negative for adenopathy. Does not bruise/bleed easily.  Psychiatric/Behavioral: Negative for dysphoric mood. The patient is not nervous/anxious.         Objective:   Physical Exam  Constitutional: She appears well-developed and well-nourished. No distress.  obese and well appearing   HENT:  Head: Normocephalic and atraumatic.  Mouth/Throat: Oropharynx is clear and moist.  Eyes: Conjunctivae and EOM are normal. Pupils are equal, round, and reactive to light.  Neck: Normal range of motion. Neck supple. No JVD present. Carotid bruit is not present. No thyromegaly present.  Cardiovascular: Normal rate, regular rhythm, normal heart sounds and intact distal pulses.  Exam reveals no gallop.   Pulmonary/Chest: Effort normal and breath sounds normal. No respiratory distress. She has no wheezes. She has no rales.  No crackles  Abdominal: Soft. Bowel sounds are normal. She exhibits no distension, no  abdominal bruit and no mass. There is no tenderness.  Ostomy site appears nl   Musculoskeletal: She exhibits no edema.  Lymphadenopathy:    She has no cervical adenopathy.  Neurological: She is alert. She has normal reflexes.  Skin: Skin is warm and dry. No rash noted. No pallor.  Psychiatric: She has a normal mood and affect.          Assessment & Plan:   Problem List Items Addressed This Visit      Cardiovascular and Mediastinum   Essential hypertension - Primary    bp in fair control at this time  BP Readings from Last 1 Encounters:  04/10/16 122/78   No changes needed Disc lifstyle change with low sodium diet and exercise  Labs reviewed Wt loss enc         Endocrine   Diabetes type 2, controlled ([place])    Lab Results  Component Value Date   HGBA1C 7.0 (H) 04/05/2016   This is up  Will begin metformin for glucose control and also to help loose wt Rev diet and exercise plan in detail  Better glucose control would also help trig levels Disc eye/foot care f/u 3 mo       Relevant Medications   metFORMIN (GLUCOPHAGE) 500 MG tablet     Other   Hypertriglyceridemia    Enc lower carb diet for this Disc goals for lipids and reasons to control them Rev labs with pt Rev low sat fat diet in detail       Morbid obesity ([place])    Discussed how this problem influences overall health and the risks it imposes  Reviewed plan for weight loss with lower calorie diet (via better food choices and also portion control or program like weight watchers) and exercise building up to or more than 30 minutes 5 days per week including some aerobic activity         Relevant Medications   metFORMIN (GLUCOPHAGE) 500 MG tablet

## 2016-04-10 NOTE — Assessment & Plan Note (Signed)
bp in fair control at this time  BP Readings from Last 1 Encounters:  04/10/16 122/78   No changes needed Disc lifstyle change with low sodium diet and exercise  Labs reviewed Wt loss enc

## 2016-04-10 NOTE — Progress Notes (Signed)
Pre visit review using our clinic review tool, if applicable. No additional management support is needed unless otherwise documented below in the visit note. 

## 2016-06-13 ENCOUNTER — Ambulatory Visit (INDEPENDENT_AMBULATORY_CARE_PROVIDER_SITE_OTHER): Payer: BC Managed Care – PPO

## 2016-06-13 ENCOUNTER — Encounter: Payer: Self-pay | Admitting: Podiatry

## 2016-06-13 ENCOUNTER — Ambulatory Visit (INDEPENDENT_AMBULATORY_CARE_PROVIDER_SITE_OTHER): Payer: BC Managed Care – PPO | Admitting: Podiatry

## 2016-06-13 DIAGNOSIS — S91209A Unspecified open wound of unspecified toe(s) with damage to nail, initial encounter: Secondary | ICD-10-CM | POA: Diagnosis not present

## 2016-06-13 DIAGNOSIS — S99922A Unspecified injury of left foot, initial encounter: Secondary | ICD-10-CM

## 2016-06-13 MED ORDER — MUPIROCIN 2 % EX OINT
1.0000 | TOPICAL_OINTMENT | Freq: Two times a day (BID) | CUTANEOUS | 0 refills | Status: DC
Start: 2016-06-13 — End: 2016-07-25

## 2016-06-13 NOTE — Progress Notes (Signed)
   HPI: Patient is a 61 year old female with PMHx of T2DM presenting today with a complaint of trauma to the left second toenail. She states she stubbed the toe on a shopping cart earlier today. She reports associated pain of the area.    Physical Exam: General: The patient is alert and oriented x3 in no acute distress.  Dermatology: 98% detached nail of the left second toe. Negative for underlying nail bed laceration. Skin is warm, dry and supple bilateral lower extremities. Negative for open lesions or macerations.  Vascular: Palpable pedal pulses bilaterally. No edema or erythema noted. Capillary refill within normal limits.  Neurological: Epicritic and protective threshold grossly intact bilaterally.   Musculoskeletal Exam: Range of motion within normal limits to all pedal and ankle joints bilateral. Muscle strength 5/5 in all groups bilateral.   Radiographic Exam:  Normal osseous mineralization. Joint spaces preserved. No fracture/dislocation/boney destruction.    Assessment: 1. Traumatic nail avulsion left second toe   Plan of Care:  1. Patient was evaluated. X-rays reviewed. 2. Removed nail with nail nipper. 3. Dry sterile dressing applied. 4. Prescription for mupirocin ointment provided to patient. 5. Prescription for doxycycline 100 mg 6. Return to clinic when necessary   Edrick Kins, DPM [facility]  Dr. Edrick Kins, [address]                                        [place], [place] [address]                Office [phone]  Fax [phone]

## 2016-06-16 MED ORDER — DOXYCYCLINE HYCLATE 100 MG PO TABS: 100.0000 mg | ORAL_TABLET | Freq: Two times a day (BID) | ORAL | 0 refills | Status: DC

## 2016-06-20 ENCOUNTER — Ambulatory Visit: Payer: BC Managed Care – PPO | Admitting: Podiatry

## 2016-07-21 ENCOUNTER — Other Ambulatory Visit (INDEPENDENT_AMBULATORY_CARE_PROVIDER_SITE_OTHER): Payer: BC Managed Care – PPO

## 2016-07-21 DIAGNOSIS — I10 Essential (primary) hypertension: Secondary | ICD-10-CM | POA: Diagnosis not present

## 2016-07-21 DIAGNOSIS — E781 Pure hyperglyceridemia: Secondary | ICD-10-CM | POA: Diagnosis not present

## 2016-07-21 DIAGNOSIS — E119 Type 2 diabetes mellitus without complications: Secondary | ICD-10-CM

## 2016-07-21 LAB — COMPREHENSIVE METABOLIC PANEL
ALBUMIN: 3.8 g/dL (ref 3.5–5.2)
ALK PHOS: 61 U/L (ref 39–117)
ALT: 26 U/L (ref 0–35)
AST: 29 U/L (ref 0–37)
BILIRUBIN TOTAL: 0.4 mg/dL (ref 0.2–1.2)
BUN: 17 mg/dL (ref 6–23)
CO2: 23 mEq/L (ref 19–32)
CREATININE: 1.13 mg/dL (ref 0.40–1.20)
Calcium: 9.9 mg/dL (ref 8.4–10.5)
Chloride: 103 mEq/L (ref 96–112)
GFR: 51.84 mL/min — ABNORMAL LOW (ref >60.00)
GLUCOSE: 116 mg/dL — AB (ref 70–99)
Potassium: 4.6 mEq/L (ref 3.5–5.1)
SODIUM: 134 meq/L — AB (ref 135–145)
TOTAL PROTEIN: 7.3 g/dL (ref 6.0–8.3)

## 2016-07-21 LAB — LIPID PANEL
Cholesterol: 163 mg/dL (ref 0–200)
HDL: 47.5 mg/dL (ref >39.00)
NonHDL: 115.02
Total CHOL/HDL Ratio: 3
Triglycerides: 215 mg/dL — ABNORMAL HIGH (ref 0.0–149.0)
VLDL: 43 mg/dL — AB (ref 0.0–40.0)

## 2016-07-21 LAB — HEMOGLOBIN A1C: HEMOGLOBIN A1C: 6.6 % — AB (ref 4.6–6.5)

## 2016-07-21 LAB — LDL CHOLESTEROL, DIRECT: LDL DIRECT: 79 mg/dL

## 2016-07-25 ENCOUNTER — Encounter: Payer: Self-pay | Admitting: Family Medicine

## 2016-07-25 ENCOUNTER — Ambulatory Visit (INDEPENDENT_AMBULATORY_CARE_PROVIDER_SITE_OTHER): Payer: BC Managed Care – PPO | Admitting: Family Medicine

## 2016-07-25 VITALS — BP 122/78 | HR 65 | Temp 98.2°F | Ht 65.5 in | Wt 237.8 lb

## 2016-07-25 DIAGNOSIS — I10 Essential (primary) hypertension: Secondary | ICD-10-CM | POA: Diagnosis not present

## 2016-07-25 DIAGNOSIS — E781 Pure hyperglyceridemia: Secondary | ICD-10-CM | POA: Diagnosis not present

## 2016-07-25 DIAGNOSIS — E119 Type 2 diabetes mellitus without complications: Secondary | ICD-10-CM | POA: Diagnosis not present

## 2016-07-25 NOTE — Patient Instructions (Addendum)
When you are interested in diabetic teaching later let me know so we can do a referral  Keep working on healthy diet and exercise  Continue metformin- set a phone alarm for the 2nd dose    Follow up in 6 months for annual visit

## 2016-07-25 NOTE — Progress Notes (Signed)
Subjective:    Patient ID: Jamie [name], female    DOB: 08-21-1955, 61 y.o.   MRN: [id number]  HPI Here for f/u of chronic health problems   Wt Readings from Last 3 Encounters:  07/25/16 237 lb 12 oz (107.8 kg)  04/10/16 248 lb 8 oz (112.7 kg)  01/10/16 243 lb 4 oz (110.3 kg)   bmi 38.9 She has been walking (was in [place] for a while)-lost 14 lb and gained some back  No that she is back-uses exercise machine daily 20=30 minutes    bp is stable today  No cp or palpitations or headaches or edema  No side effects to medicines  BP Readings from Last 3 Encounters:  07/25/16 122/78  04/10/16 122/78  01/10/16 136/78       Diabetes Home sugar results  DM diet - better  Exercise - better  Symptoms-none  A1C last  Lab Results  Component Value Date   HGBA1C 6.6 (H) 07/21/2016  this is down from 7.0  No problems with medications -metformin  (she tends to forget her dinner dose of metformin)- working on that  Lab Results  Component Value Date   MICROALBUR <0.7 04/05/2016    Last eye exam 6/17  Hx of high triglycerides Lab Results  Component Value Date   CHOL 163 07/21/2016   CHOL 179 04/05/2016   CHOL 159 01/06/2016   Lab Results  Component Value Date   HDL 47.50 07/21/2016   HDL 48.90 04/05/2016   HDL 50.00 01/06/2016   Lab Results  Component Value Date   LDLCALC 76 01/05/2015   LDLCALC 65 09/25/2012   LDLCALC 90 09/26/2011   Lab Results  Component Value Date   TRIG 215.0 (H) 07/21/2016   TRIG 247.0 (H) 04/05/2016   TRIG 218.0 (H) 01/06/2016   Lab Results  Component Value Date   CHOLHDL 3 07/21/2016   CHOLHDL 4 04/05/2016   CHOLHDL 3 01/06/2016   Lab Results  Component Value Date   LDLDIRECT 79.0 07/21/2016   LDLDIRECT 89.0 04/05/2016   LDLDIRECT 88.0 01/06/2016  LDL is close to goal  Trig are improved with better DM control  Was eating more fatty food when in [place]    Patient Active Problem List   Diagnosis Date Noted  .  Hypertriglyceridemia 04/10/2016  . Toe contusion 07/16/2015  . Morbid obesity ([place]) 01/08/2015  . Elevated transaminase level 12/09/2013  . Red blood cell antibody positive 07/02/2013  . Abnormal urine 05/15/2012  . Elevated C-reactive protein (CRP) 05/15/2012  . Stricture of anal canal 11/21/2011  . Rectal discharge 10/02/2011  . Routine general medical examination at a health care facility 09/24/2011  . Apnea 11/14/2010  . Neck pain 11/14/2010  . Nevus of lower leg 11/14/2010  . Other screening mammogram 10/31/2010  . Stress reaction, emotional 05/02/2010  . Compulsive overeater 05/02/2010  . Arthritis   . Bowel obstruction ([place])   . KNEE PAIN 10/14/2009  . HOARSENESS 07/21/2009  . Diabetes type 2, controlled ([place]) 04/23/2009  . ROSACEA 09/12/2007  . Essential hypertension 05/15/2007  . OSA (obstructive sleep apnea) 12/13/2006  . Ulcerative colitis ([place]) 07/12/2006   Past Medical History:  Diagnosis Date  . Arthritis    generalized  . Bowel obstruction ([facility])    repeated (? possible adhesions) neg EGD  . Colitis    with colectomy  . Gall stones 2008  . Gastroenteritis 07/25/14   hosp for IVF   . HTN (hypertension)   . Hyperglycemia  mild-monitors A1C  . Obesity   . Rosacea   . Sleep apnea    CPAP everynight   Past Surgical History:  Procedure Laterality Date  . CHOLECYSTECTOMY    . COLECTOMY  1993   has ileostomy  . COLOSTOMY REVISION  12/06/2011   Procedure: COLOSTOMY REVISION;  Surgeon: Leighton Ruff, MD;  Location: [facility];  Service: General;  Laterality: Right;  OSTOMY revision  . EXAMINATION UNDER ANESTHESIA  12/06/2011   Procedure: EXAM UNDER ANESTHESIA;  Surgeon: Leighton Ruff, MD;  Location: [facility];  Service: General;  Laterality: N/A;  rectal exam under anesthesia, anal dilation, pouchoscopy    . TOTAL ABDOMINAL HYSTERECTOMY  05/2006   for abscessed ovaries   Social History  Substance Use Topics  . Smoking  status: Never Smoker  . Smokeless tobacco: Never Used  . Alcohol use No   Family History  Problem Relation Age of Onset  . Cancer Father        colon  . Liver cancer Father        resection secondary to mets  . Hyperlipidemia Father   . Hypertension Father   . Allergies Father   . Ulcerative colitis Father   . Hypertension Mother   . Hyperlipidemia Mother   . Cirrhosis Mother        NASH  . Diabetes Mother   . Allergies Brother   . Breast cancer Paternal Grandmother   . Breast cancer Cousin 60       paternal cousins   Allergies  Allergen Reactions  . Lisinopril     REACTION: felt bad/ stomach hurt/ weak and tired  . Ciprofloxacin Rash    Erythema and pruritis around IV site, erythema and rash along vein  . Sulfa Antibiotics Rash   Current Outpatient Prescriptions on File Prior to Visit  Medication Sig Dispense Refill  . AZO-CRANBERRY PO Take 2 capsules by mouth daily.    . Blood Glucose Monitoring Suppl (GLUCOMETER ELITE CLASSIC) KIT by Does not apply route daily. Check sugar once daily and as needed for DM2 250.0     . CINNAMON PO Take 1,000 mg by mouth daily.    . fluticasone (FLOVENT HFA) 220 MCG/ACT inhaler APPLY 2 PUFFS TOPICALLY TO CHOLOSTOMY SITE EVERY DAY AS DIRECTED 12 g 5  . glucose blood test strip 1 each by Other route daily. Reported on 07/16/2015    . hyoscyamine (LEVSIN SL) 0.125 MG SL tablet Take 1 tablet (0.125 mg total) by mouth every 4 (four) hours as needed. For abdominal cramps 30 tablet 11  . ibuprofen (ADVIL,MOTRIN) 200 MG tablet Take 200 mg by mouth every 6 (six) hours as needed (for arthritis).     . metFORMIN (GLUCOPHAGE) 500 MG tablet Take 1 tablet (500 mg total) by mouth 2 (two) times daily with a meal. 60 tablet 11  . metoprolol tartrate (LOPRESSOR) 25 MG tablet TAKE 1 TABLET (25 MG TOTAL) BY MOUTH DAILY. 90 tablet 3  . Multiple Vitamin (MULTIVITAMIN) tablet Take 1 tablet by mouth daily.      . naproxen sodium (ANAPROX) 220 MG tablet Take 220 mg  by mouth daily.     . NON FORMULARY SLEEPS WITH BI-PAP    . omeprazole (PRILOSEC) 20 MG capsule Take 20 mg by mouth every other day.     . ondansetron (ZOFRAN ODT) 4 MG disintegrating tablet Take 1 tablet (4 mg total) by mouth every 4 (four) hours as needed for nausea or vomiting. [place]  tablet 0  . zolpidem (AMBIEN) 10 MG tablet Take 0.5-1 tablets (5-10 mg total) by mouth at bedtime as needed for sleep. TAKE 1 TABLET BY MOUTH AT BEDTIME AS NEEDED 30 tablet 0   No current facility-administered medications on file prior to visit.     Review of Systems Review of Systems  Constitutional: Negative for fever, appetite change, fatigue and unexpected weight change.  Eyes: Negative for pain and visual disturbance.  Respiratory: Negative for cough and shortness of breath.   Cardiovascular: Negative for cp or palpitations    Gastrointestinal: Negative for nausea, diarrhea and constipation.  Genitourinary: Negative for urgency and frequency.  Skin: Negative for pallor or rash   Neurological: Negative for weakness, light-headedness, numbness and headaches.  Hematological: Negative for adenopathy. Does not bruise/bleed easily.  Psychiatric/Behavioral: Negative for dysphoric mood. The patient is not nervous/anxious.         Objective:   Physical Exam  Constitutional: She appears well-developed and well-nourished. No distress.  obese and well appearing   HENT:  Head: Normocephalic and atraumatic.  Mouth/Throat: Oropharynx is clear and moist.  Eyes: Conjunctivae and EOM are normal. Pupils are equal, round, and reactive to light.  Neck: Normal range of motion. Neck supple. No JVD present. Carotid bruit is not present. No thyromegaly present.  Cardiovascular: Normal rate, regular rhythm, normal heart sounds and intact distal pulses.  Exam reveals no gallop.   Pulmonary/Chest: Effort normal and breath sounds normal. No respiratory distress. She has no wheezes. She has no rales.  No crackles  Abdominal:  Soft. Bowel sounds are normal. She exhibits no distension, no abdominal bruit and no mass. There is no tenderness.  Musculoskeletal: She exhibits no edema.  Lymphadenopathy:    She has no cervical adenopathy.  Neurological: She is alert. She has normal reflexes.  Skin: Skin is warm and dry. No rash noted.  Psychiatric: She has a normal mood and affect.          Assessment & Plan:   Problem List Items Addressed This Visit      Cardiovascular and Mediastinum   Essential hypertension - Primary    bp in fair control at this time  BP Readings from Last 1 Encounters:  07/25/16 122/78   No changes needed Disc lifstyle change with low sodium diet and exercise  Labs reviewed         Endocrine   Diabetes type 2, controlled ([place])    Lab Results  Component Value Date   HGBA1C 6.6 (H) 07/21/2016   Improved from 7.0 Enc compliance with metformin (she does not want to change to xr)- but that is an option  Disc diet/exercise  F/u 6 mo         Other   Hypertriglyceridemia    Improved with better glucose control  Diet discussed

## 2016-07-26 NOTE — Assessment & Plan Note (Signed)
Lab Results  Component Value Date   HGBA1C 6.6 (H) 07/21/2016   Improved from 7.0 Enc compliance with metformin (she does not want to change to xr)- but that is an option  Disc diet/exercise  F/u 6 mo

## 2016-07-26 NOTE — Assessment & Plan Note (Signed)
Improved with better glucose control  Diet discussed

## 2016-07-26 NOTE — Assessment & Plan Note (Signed)
bp in fair control at this time  BP Readings from Last 1 Encounters:  07/25/16 122/78   No changes needed Disc lifstyle change with low sodium diet and exercise  Labs reviewed

## 2016-09-19 LAB — HM DIABETES EYE EXAM

## 2016-10-12 ENCOUNTER — Encounter: Payer: Self-pay | Admitting: Family Medicine

## 2016-12-21 ENCOUNTER — Encounter: Payer: Self-pay | Admitting: Internal Medicine

## 2016-12-25 ENCOUNTER — Encounter: Payer: Self-pay | Admitting: Internal Medicine

## 2016-12-25 ENCOUNTER — Ambulatory Visit: Payer: BC Managed Care – PPO | Admitting: Internal Medicine

## 2016-12-25 VITALS — BP 138/84 | HR 84 | Ht 65.5 in | Wt 236.0 lb

## 2016-12-25 DIAGNOSIS — G4733 Obstructive sleep apnea (adult) (pediatric): Secondary | ICD-10-CM | POA: Diagnosis not present

## 2016-12-25 DIAGNOSIS — K51919 Ulcerative colitis, unspecified with unspecified complications: Secondary | ICD-10-CM

## 2016-12-25 NOTE — Patient Instructions (Signed)
Continue to use your bipap every night.  Continue to try to do as much time as bed in possible before moving to recliner.

## 2016-12-25 NOTE — Progress Notes (Signed)
[facility] Consultation      Assessment and Plan:  Severe obstructive sleep apnea. -Patient was initially diagnosed with sleep apnea on a home titration test, and underwent an in lab bipap titration. She has tried an auto-CPAP titration per previous notes--did not tolerate CPAP at that time.  --On Bipap 19/14; AHI well-controlled, no residual sleepiness. -patient continues to move to the recliner every night, though she is now getting nearly 6 hours in bed before moving.  I encouraged her to try to maximize her time in bed, now that she is getting about 6 hours, we talked about trying not to "slide backwards" by not increasing her time in the recliner.   Ulcerative colitis. -Patient has to empty her colostomy once per evening, this is time consuming, and interrupts with her sleep.    Jamie [name] is a 61 y.o. old female seen in consultation for chief complaint of:    Chief Complaint  Patient presents with  . Follow-up    1 yr f/u for sleep: feels she does ok with BiPAP:     HPI:  She has been using bipap every night and she is not sleep during the day. She put the mask on at 11 pm, her husband also wears a cpap.  She still is not sleeping in the bed for the night, she spends about 6 hours in bed before she moves to her recliner.  She wakes up at night to go to the bathroom to empty her ileostomy, this is somewhat time-consuming. She then moves to her recliner where she does not use Pap.  **Review of 30 days of download data as of 12/21/16; usage is 30/30 days.  Average usage on days used is 5 hours 51 minutes.  Current settings are her curve 10 V auto; BiPAP 19/10 residual AHI is 3.2.  -home sleep study from the December 20th 2012:AHI 37.  No flowsheet data found.  Pulmonary Functions Testing Results:  No results found for: FEV1, FVC, FEV1FVC, TLC, DLCO Medication:       Allergies:  Lisinopril; Ciprofloxacin; and Sulfa antibiotics  Review of  Systems: Gen:  Denies  fever, sweats, chills HEENT: Denies blurred vision, double vision. bleeds, sore throat Cvc:  No dizziness, chest pain. Resp:   Denies cough or sputum porduction, shortness of breath Gi: Denies swallowing difficulty, stomach pain. Gu:  Denies bladder incontinence, burning urine Ext:   No Joint pain, stiffness. Skin: No skin rash,  hives Endoc:  No polyuria, polydipsia. Psych: No depression, insomnia. Other:  All other systems were reviewed with the patient and were negative other that what is mentioned in the HPI.   Physical Examination:   VS: BP 132/78 (BP Location: Left Arm, Cuff Size: Normal)   Pulse 78   Wt 245 lb (111.1 kg)   SpO2 96%   BMI 40.77 kg/m   General Appearance: No distress  Neuro:without focal findings,  speech normal,  HEENT: PERRLA, EOM intact.   Pulmonary: normal breath sounds, No wheezing.  CardiovascularNormal S1,S2.  No m/r/g.   Abdomen: Benign, Soft, non-tender. Ostomy in place in right side.  Renal:  No costovertebral tenderness  GU:  No performed at this time. Endoc: No evident thyromegaly, no signs of acromegaly. Skin:   warm, no rashes, no ecchymosis  Extremities: normal, no cyanosis, clubbing.  Other findings:    LABORATORY PANEL:   CBC No results for input(s): WBC, HGB, HCT, PLT in the last 168 hours. ------------------------------------------------------------------------------------------------------------------  Chemistries  No  results for input(s): NA, K, CL, CO2, GLUCOSE, BUN, CREATININE, CALCIUM, MG, AST, ALT, ALKPHOS, BILITOT in the last 168 hours.  Invalid input(s): GFRCGP ------------------------------------------------------------------------------------------------------------------  Cardiac Enzymes No results for input(s): TROPONINI in the last 168 hours. ------------------------------------------------------------  RADIOLOGY:  No results found.     Thank  you for the consultation and for  allowing [facility] to assist in the care of your patient. Our recommendations are noted above.  Please contact us if we can be of further service.   Marda Stalker, MD.  Board Certified in Internal Medicine, Pulmonary Medicine, [place], and Sleep Medicine.  [facility] Office Number: [phone]  Patricia Pesa, M.D.  Merton Border, M.D

## 2017-01-04 ENCOUNTER — Telehealth: Payer: Self-pay | Admitting: Family Medicine

## 2017-01-04 DIAGNOSIS — I10 Essential (primary) hypertension: Secondary | ICD-10-CM

## 2017-01-04 DIAGNOSIS — E119 Type 2 diabetes mellitus without complications: Secondary | ICD-10-CM

## 2017-01-04 DIAGNOSIS — E781 Pure hyperglyceridemia: Secondary | ICD-10-CM

## 2017-01-04 NOTE — Telephone Encounter (Signed)
-----   Message from Ellamae Sia sent at 01/04/2017 11:15 AM EST ----- Regarding: Lab orders for Monday, 12.17.18 Patient is scheduled for CPX labs, please order future labs, Thanks , Karna Christmas

## 2017-01-08 ENCOUNTER — Other Ambulatory Visit (INDEPENDENT_AMBULATORY_CARE_PROVIDER_SITE_OTHER): Payer: BC Managed Care – PPO

## 2017-01-08 DIAGNOSIS — I10 Essential (primary) hypertension: Secondary | ICD-10-CM

## 2017-01-08 DIAGNOSIS — E781 Pure hyperglyceridemia: Secondary | ICD-10-CM | POA: Diagnosis not present

## 2017-01-08 DIAGNOSIS — E119 Type 2 diabetes mellitus without complications: Secondary | ICD-10-CM | POA: Diagnosis not present

## 2017-01-08 LAB — CBC WITH DIFFERENTIAL/PLATELET
Basophils Absolute: 0 10*3/uL (ref 0.0–0.1)
Basophils Relative: 0.3 % (ref 0.0–3.0)
EOS PCT: 5 % (ref 0.0–5.0)
Eosinophils Absolute: 0.5 10*3/uL (ref 0.0–0.7)
HEMATOCRIT: 40.3 % (ref 36.0–46.0)
HEMOGLOBIN: 13.5 g/dL (ref 12.0–15.0)
LYMPHS PCT: 14.4 % (ref 12.0–46.0)
Lymphs Abs: 1.5 10*3/uL (ref 0.7–4.0)
MCHC: 33.6 g/dL (ref 30.0–36.0)
MCV: 95.3 fl (ref 78.0–100.0)
MONOS PCT: 7.1 % (ref 3.0–12.0)
Monocytes Absolute: 0.7 10*3/uL (ref 0.1–1.0)
Neutro Abs: 7.5 10*3/uL (ref 1.4–7.7)
Neutrophils Relative %: 73.2 % (ref 43.0–77.0)
Platelets: 267 10*3/uL (ref 150.0–400.0)
RBC: 4.23 Mil/uL (ref 3.87–5.11)
RDW: 13.1 % (ref 11.5–15.5)
WBC: 10.2 10*3/uL (ref 4.0–10.5)

## 2017-01-08 LAB — COMPREHENSIVE METABOLIC PANEL
ALBUMIN: 3.8 g/dL (ref 3.5–5.2)
ALK PHOS: 65 U/L (ref 39–117)
ALT: 23 U/L (ref 0–35)
AST: 27 U/L (ref 0–37)
BUN: 15 mg/dL (ref 6–23)
CALCIUM: 9.4 mg/dL (ref 8.4–10.5)
CHLORIDE: 103 meq/L (ref 96–112)
CO2: 24 mEq/L (ref 19–32)
Creatinine, Ser: 1.07 mg/dL (ref 0.40–1.20)
GFR: 55.12 mL/min — AB (ref >60.00)
Glucose, Bld: 119 mg/dL — ABNORMAL HIGH (ref 70–99)
POTASSIUM: 4.3 meq/L (ref 3.5–5.1)
SODIUM: 134 meq/L — AB (ref 135–145)
TOTAL PROTEIN: 7.1 g/dL (ref 6.0–8.3)
Total Bilirubin: 0.8 mg/dL (ref 0.2–1.2)

## 2017-01-08 LAB — LIPID PANEL
CHOL/HDL RATIO: 3
CHOLESTEROL: 137 mg/dL (ref 0–200)
HDL: 43.5 mg/dL (ref >39.00)
NonHDL: 93.89
TRIGLYCERIDES: 201 mg/dL — AB (ref 0.0–149.0)
VLDL: 40.2 mg/dL — AB (ref 0.0–40.0)

## 2017-01-08 LAB — HEMOGLOBIN A1C: Hgb A1c MFr Bld: 6.4 % (ref 4.6–6.5)

## 2017-01-08 LAB — TSH: TSH: 1.84 u[IU]/mL (ref 0.35–4.50)

## 2017-01-08 LAB — LDL CHOLESTEROL, DIRECT: LDL DIRECT: 83 mg/dL

## 2017-01-08 NOTE — Addendum Note (Signed)
Addended by: Lurlean Nanny on: 01/08/2017 08:42 AM   Modules accepted: Orders

## 2017-01-10 ENCOUNTER — Ambulatory Visit (INDEPENDENT_AMBULATORY_CARE_PROVIDER_SITE_OTHER): Payer: BC Managed Care – PPO | Admitting: Family Medicine

## 2017-01-10 ENCOUNTER — Encounter: Payer: Self-pay | Admitting: Family Medicine

## 2017-01-10 VITALS — BP 130/80 | HR 73 | Temp 97.8°F | Ht 65.33 in | Wt 234.0 lb

## 2017-01-10 DIAGNOSIS — E781 Pure hyperglyceridemia: Secondary | ICD-10-CM | POA: Diagnosis not present

## 2017-01-10 DIAGNOSIS — K51019 Ulcerative (chronic) pancolitis with unspecified complications: Secondary | ICD-10-CM | POA: Diagnosis not present

## 2017-01-10 DIAGNOSIS — E119 Type 2 diabetes mellitus without complications: Secondary | ICD-10-CM | POA: Diagnosis not present

## 2017-01-10 DIAGNOSIS — I10 Essential (primary) hypertension: Secondary | ICD-10-CM | POA: Diagnosis not present

## 2017-01-10 DIAGNOSIS — Z8719 Personal history of other diseases of the digestive system: Secondary | ICD-10-CM

## 2017-01-10 DIAGNOSIS — Z0000 Encounter for general adult medical examination without abnormal findings: Secondary | ICD-10-CM

## 2017-01-10 MED ORDER — HYOSCYAMINE SULFATE 0.125 MG SL SUBL: 0.1250 mg | SUBLINGUAL_TABLET | SUBLINGUAL | 11 refills | Status: DC | PRN

## 2017-01-10 MED ORDER — METOPROLOL TARTRATE 25 MG PO TABS: ORAL_TABLET | ORAL | 3 refills | Status: DC

## 2017-01-10 MED ORDER — ATORVASTATIN CALCIUM 10 MG PO TABS: 10.0000 mg | ORAL_TABLET | Freq: Every day | ORAL | 11 refills | Status: DC

## 2017-01-10 NOTE — Progress Notes (Signed)
Subjective:    Patient ID: Jamie [name], female    DOB: 15-Sep-1955, 61 y.o.   MRN: [id number]  HPI Here for health maintenance exam and to review chronic medical problems    Feeling ok overall  Has been getting over a cold   Wt Readings from Last 3 Encounters:  01/10/17 234 lb (106.1 kg)  12/25/16 236 lb (107 kg)  07/25/16 237 lb 12 oz (107.8 kg)  is working on it - going to [employer]  13 lb since sept -happy with this  More active but no exercise program   (plans to start using her glider after holidays) 38.55 kg/m   Colonoscopy/colon screening -not needed due to colectomy for colitis   Mammogram 1/18-nl  Self breast exam -no changes   Eye exam 8/18  PNA 11/15 Tetanus shot 9/14 Flu shot 9/18  zostavax 12/16 May be interested in the shingrix later    bp is stable today  No cp or palpitations or headaches or edema  No side effects to medicines  BP Readings from Last 3 Encounters:  12/25/16 138/84  07/25/16 122/78  04/10/16 122/78     DM Lab Results  Component Value Date   HGBA1C 6.4 01/08/2017  down from 6.6  Made some progress with weight loss  More veg and fruit than processed carbs   Lab Results  Component Value Date   MICROALBUR <0.7 04/05/2016     Hyperlipidemia Lab Results  Component Value Date   CHOL 137 01/08/2017   CHOL 163 07/21/2016   CHOL 179 04/05/2016   Lab Results  Component Value Date   HDL 43.50 01/08/2017   HDL 47.50 07/21/2016   HDL 48.90 04/05/2016   Lab Results  Component Value Date   LDLCALC 76 01/05/2015   LDLCALC 65 09/25/2012   LDLCALC 90 09/26/2011   Lab Results  Component Value Date   TRIG 201.0 (H) 01/08/2017   TRIG 215.0 (H) 07/21/2016   TRIG 247.0 (H) 04/05/2016   Lab Results  Component Value Date   CHOLHDL 3 01/08/2017   CHOLHDL 3 07/21/2016   CHOLHDL 4 04/05/2016   Lab Results  Component Value Date   LDLDIRECT 83.0 01/08/2017   LDLDIRECT 79.0 07/21/2016   LDLDIRECT 89.0 04/05/2016    Lab Results  Component Value Date   CREATININE 1.07 01/08/2017   BUN 15 01/08/2017   NA 134 (L) 01/08/2017   K 4.3 01/08/2017   CL 103 01/08/2017   CO2 24 01/08/2017   Lab Results  Component Value Date   ALT 23 01/08/2017   AST 27 01/08/2017   ALKPHOS 65 01/08/2017   BILITOT 0.8 01/08/2017   Lab Results  Component Value Date   WBC 10.2 01/08/2017   HGB 13.5 01/08/2017   HCT 40.3 01/08/2017   MCV 95.3 01/08/2017   PLT 267.0 01/08/2017    Lab Results  Component Value Date   TSH 1.84 01/08/2017     Needs hyoscamine prn to prevent bowel blockage if she gets cramping  (h/o sm b obst in the past)  Patient Active Problem List   Diagnosis Date Noted  . Hypertriglyceridemia 04/10/2016  . Toe contusion 07/16/2015  . Obesity (BMI 30-39.9) 01/08/2015  . Elevated transaminase level 12/09/2013  . Red blood cell antibody positive 07/02/2013  . Abnormal urine 05/15/2012  . Elevated C-reactive protein (CRP) 05/15/2012  . Stricture of anal canal 11/21/2011  . Rectal discharge 10/02/2011  . Routine general medical examination at a health care facility  09/24/2011  . Apnea 11/14/2010  . Neck pain 11/14/2010  . Nevus of lower leg 11/14/2010  . Other screening mammogram 10/31/2010  . Stress reaction, emotional 05/02/2010  . Compulsive overeater 05/02/2010  . Arthritis   . H/O small bowel obstruction   . KNEE PAIN 10/14/2009  . HOARSENESS 07/21/2009  . Diabetes type 2, controlled ([place]) 04/23/2009  . ROSACEA 09/12/2007  . Essential hypertension 05/15/2007  . OSA (obstructive sleep apnea) 12/13/2006  . Ulcerative colitis ([place]) 07/12/2006   Past Medical History:  Diagnosis Date  . Arthritis    generalized  . Bowel obstruction (H[facility])    repeated (? possible adhesions) neg EGD  . Colitis    with colectomy  . Gall stones 2008  . Gastroenteritis 07/25/14   hosp for IVF   . HTN (hypertension)   . Hyperglycemia    mild-monitors A1C  . Obesity   . Rosacea   . Sleep apnea     CPAP everynight   Past Surgical History:  Procedure Laterality Date  . CHOLECYSTECTOMY    . COLECTOMY  1993   has ileostomy  . COLOSTOMY REVISION  12/06/2011   Procedure: COLOSTOMY REVISION;  Surgeon: Leighton Ruff, MD;  [facility];  Service: General;  Laterality: Right;  OSTOMY revision  . EXAMINATION UNDER ANESTHESIA  12/06/2011   Procedure: EXAM UNDER ANESTHESIA;  Surgeon: Leighton Ruff, MD;  Location: [facility];  Service: General;  Laterality: N/A;  rectal exam under anesthesia, anal dilation, pouchoscopy    . TOTAL ABDOMINAL HYSTERECTOMY  05/2006   for abscessed ovaries   Social History   Tobacco Use  . Smoking status: Never Smoker  . Smokeless tobacco: Never Used  Substance Use Topics  . Alcohol use: No    Alcohol/week: 0.0 oz  . Drug use: No   Family History  Problem Relation Age of Onset  . Cancer Father        colon  . Liver cancer Father        resection secondary to mets  . Hyperlipidemia Father   . Hypertension Father   . Allergies Father   . Ulcerative colitis Father   . Hypertension Mother   . Hyperlipidemia Mother   . Cirrhosis Mother        NASH  . Diabetes Mother   . Allergies Brother   . Breast cancer Paternal Grandmother   . Breast cancer Cousin 71       paternal cousins   Allergies  Allergen Reactions  . Lisinopril     REACTION: felt bad/ stomach hurt/ weak and tired  . Ciprofloxacin Rash    Erythema and pruritis around IV site, erythema and rash along vein  . Sulfa Antibiotics Rash   Current Outpatient Medications on File Prior to Visit  Medication Sig Dispense Refill  . AZO-CRANBERRY PO Take 2 capsules by mouth daily.    . Blood Glucose Monitoring Suppl (GLUCOMETER ELITE CLASSIC) KIT by Does not apply route daily. Check sugar once daily and as needed for DM2 250.0     . CINNAMON PO Take 1,000 mg by mouth daily.    . fluticasone (FLOVENT HFA) 220 MCG/ACT inhaler APPLY 2 PUFFS TOPICALLY TO  CHOLOSTOMY SITE EVERY DAY AS DIRECTED 12 g 5  . glucose blood test strip 1 each by Other route daily. Reported on 07/16/2015    . ibuprofen (ADVIL,MOTRIN) 200 MG tablet Take 200 mg by mouth every 6 (six) hours as needed (for arthritis).     Marland Kitchen  metFORMIN (GLUCOPHAGE) 500 MG tablet Take 1 tablet (500 mg total) by mouth 2 (two) times daily with a meal. 60 tablet 11  . Multiple Vitamin (MULTIVITAMIN) tablet Take 1 tablet by mouth daily.      . naproxen sodium (ANAPROX) 220 MG tablet Take 220 mg by mouth daily.     . NON FORMULARY SLEEPS WITH BI-PAP    . omeprazole (PRILOSEC) 20 MG capsule Take 20 mg by mouth every other day.     . ondansetron (ZOFRAN ODT) 4 MG disintegrating tablet Take 1 tablet (4 mg total) by mouth every 4 (four) hours as needed for nausea or vomiting. 20 tablet 0  . zolpidem (AMBIEN) 10 MG tablet Take 0.5-1 tablets (5-10 mg total) by mouth at bedtime as needed for sleep. TAKE 1 TABLET BY MOUTH AT BEDTIME AS NEEDED 30 tablet 0   No current facility-administered medications on file prior to visit.      Review of Systems  Constitutional: Negative for activity change, appetite change, fatigue, fever and unexpected weight change.  HENT: Positive for rhinorrhea. Negative for congestion, ear pain, sinus pressure and sore throat.   Eyes: Negative for pain, redness and visual disturbance.  Respiratory: Positive for cough. Negative for shortness of breath and wheezing.        Cough is improved from a recent cold  Cardiovascular: Negative for chest pain and palpitations.  Gastrointestinal: Negative for abdominal pain, blood in stool, constipation and diarrhea.       Pos for occ abd cramping -not today  Endocrine: Negative for polydipsia and polyuria.  Genitourinary: Negative for dysuria, frequency and urgency.  Musculoskeletal: Negative for arthralgias, back pain and myalgias.  Skin: Negative for pallor and rash.  Allergic/Immunologic: Negative for environmental allergies.    Neurological: Negative for dizziness, syncope and headaches.  Hematological: Negative for adenopathy. Does not bruise/bleed easily.  Psychiatric/Behavioral: Negative for decreased concentration and dysphoric mood. The patient is not nervous/anxious.        Objective:   Physical Exam  Constitutional: She appears well-developed and well-nourished. No distress.  obese and well appearing   HENT:  Head: Normocephalic and atraumatic.  Right Ear: External ear normal.  Left Ear: External ear normal.  Mouth/Throat: Oropharynx is clear and moist.  Eyes: Conjunctivae and EOM are normal. Pupils are equal, round, and reactive to light. No scleral icterus.  Neck: Normal range of motion. Neck supple. No JVD present. Carotid bruit is not present. No thyromegaly present.  Cardiovascular: Normal rate, regular rhythm, normal heart sounds and intact distal pulses. Exam reveals no gallop.  Pulmonary/Chest: Effort normal and breath sounds normal. No respiratory distress. She has no wheezes. She exhibits no tenderness.  Abdominal: Soft. Bowel sounds are normal. She exhibits no distension, no abdominal bruit and no mass. There is no tenderness.  Ostomy site looks clean and uninflammed No tenderness   Genitourinary: No breast swelling, tenderness, discharge or bleeding.  Genitourinary Comments: Breast exam: No mass, nodules, thickening, tenderness, bulging, retraction, inflamation, nipple discharge or skin changes noted.  No axillary or clavicular LA.      Musculoskeletal: Normal range of motion. She exhibits no edema or tenderness.  Lymphadenopathy:    She has no cervical adenopathy.  Neurological: She is alert. She has normal reflexes. No cranial nerve deficit. She exhibits normal muscle tone. Coordination normal.  Skin: Skin is warm and dry. No rash noted. No erythema. No pallor.  Solar lentigines diffusely   Psychiatric: She has a normal mood and affect.  Assessment & Plan:   Problem  List Items Addressed This Visit      Cardiovascular and Mediastinum   Essential hypertension   Relevant Medications   atorvastatin (LIPITOR) 10 MG tablet   metoprolol tartrate (LOPRESSOR) 25 MG tablet     Digestive   Ulcerative colitis (HCC)     Endocrine   Diabetes type 2, controlled ([place])   Relevant Medications   atorvastatin (LIPITOR) 10 MG tablet     Other   H/O small bowel obstruction    levsin refilled as this helps cramping pt gets that sometimes leads to bowel obst        Hypertriglyceridemia    In setting of DM given new guidelines-will start very low dose statin  Atorvastatin 5 mg QOD Update of side eff Disc goals for lipids and reasons to control them Rev labs with pt Rev low sat fat diet in detail       Relevant Medications   atorvastatin (LIPITOR) 10 MG tablet   metoprolol tartrate (LOPRESSOR) 25 MG tablet   Routine general medical examination at a health care facility - Primary    Reviewed health habits including diet and exercise and skin cancer prevention Reviewed appropriate screening tests for age  Also reviewed health mt list, fam hx and immunization status , as well as social and family history   See HPI Wt loss commended-will start exercise Pt will schedule her own mammogram  Labs rev

## 2017-01-10 NOTE — Assessment & Plan Note (Signed)
In setting of DM given new guidelines-will start very low dose statin  Atorvastatin 5 mg QOD Update of side eff Disc goals for lipids and reasons to control them Rev labs with pt Rev low sat fat diet in detail

## 2017-01-10 NOTE — Patient Instructions (Addendum)
Don't forget to schedule your mammogram next month   If you have any issues or side effects from atorvastatin please let me know This is to prevent vascular disease   Follow up in 6 months

## 2017-01-10 NOTE — Assessment & Plan Note (Signed)
Reviewed health habits including diet and exercise and skin cancer prevention Reviewed appropriate screening tests for age  Also reviewed health mt list, fam hx and immunization status , as well as social and family history   See HPI Wt loss commended-will start exercise Pt will schedule her own mammogram  Labs rev

## 2017-01-10 NOTE — Assessment & Plan Note (Signed)
levsin refilled as this helps cramping pt gets that sometimes leads to bowel obst

## 2017-02-07 ENCOUNTER — Other Ambulatory Visit: Payer: Self-pay | Admitting: Family Medicine

## 2017-02-07 DIAGNOSIS — Z1231 Encounter for screening mammogram for malignant neoplasm of breast: Secondary | ICD-10-CM

## 2017-02-22 ENCOUNTER — Ambulatory Visit
Admission: RE | Admit: 2017-02-22 | Discharge: 2017-02-22 | Disposition: A | Discharge status: Home / Self Care | Payer: BC Managed Care – PPO | Source: Ambulatory Visit | Attending: Family Medicine | Admitting: Family Medicine

## 2017-02-22 DIAGNOSIS — Z1231 Encounter for screening mammogram for malignant neoplasm of breast: Secondary | ICD-10-CM | POA: Insufficient documentation

## 2017-04-09 ENCOUNTER — Encounter: Payer: Self-pay | Admitting: Family Medicine

## 2017-04-09 DIAGNOSIS — L439 Lichen planus, unspecified: Secondary | ICD-10-CM | POA: Insufficient documentation

## 2017-05-15 ENCOUNTER — Other Ambulatory Visit: Payer: Self-pay | Admitting: Family Medicine

## 2017-06-04 ENCOUNTER — Encounter: Payer: Self-pay | Admitting: Family Medicine

## 2017-06-04 DIAGNOSIS — Z7189 Other specified counseling: Secondary | ICD-10-CM

## 2017-06-05 DIAGNOSIS — Z7189 Other specified counseling: Secondary | ICD-10-CM | POA: Insufficient documentation

## 2017-06-05 NOTE — Telephone Encounter (Signed)
Ref to ostomy nurse  See email Will send to [facility] Thanks

## 2017-06-17 ENCOUNTER — Other Ambulatory Visit: Payer: Self-pay | Admitting: Family Medicine

## 2017-06-26 ENCOUNTER — Telehealth: Payer: Self-pay | Admitting: Family Medicine

## 2017-06-26 NOTE — Telephone Encounter (Signed)
Copied from [place] [phone]. Topic: Quick Communication - Rx Refill/Question >> Jun 26, 2017  8:47 AM Boyd Kerbs wrote: Medication:  supplies a medical for diabetes from [facility]  Strips testing    glucose blood test strip Lansets - delica Controlled liquid(unsure of name)  - put on test strips and then puts in monitors (she has not used before but it is recommended)   Has the patient contacted their pharmacy? Yes.   (Agent: If no, request that the patient contact the pharmacy for the refill.) (Agent: If yes, when and what did the pharmacy advise?)  Preferred Pharmacy (with phone number or street name):   [facility] [address] [place] [address] [phone]     Agent: Please be advised that RX refills may take up to 3 business days. We ask that you follow-up with your pharmacy.

## 2017-06-27 ENCOUNTER — Other Ambulatory Visit (INDEPENDENT_AMBULATORY_CARE_PROVIDER_SITE_OTHER): Payer: BC Managed Care – PPO

## 2017-06-27 DIAGNOSIS — E119 Type 2 diabetes mellitus without complications: Secondary | ICD-10-CM | POA: Diagnosis not present

## 2017-06-27 DIAGNOSIS — E781 Pure hyperglyceridemia: Secondary | ICD-10-CM | POA: Diagnosis not present

## 2017-06-27 LAB — LIPID PANEL
Cholesterol: 133 mg/dL (ref 0–200)
HDL: 49.8 mg/dL (ref >39.00)
LDL Cholesterol: 49 mg/dL (ref 0–99)
NONHDL: 83.23
Total CHOL/HDL Ratio: 3
Triglycerides: 169 mg/dL — ABNORMAL HIGH (ref 0.0–149.0)
VLDL: 33.8 mg/dL (ref 0.0–40.0)

## 2017-06-27 LAB — ALT: ALT: 19 U/L (ref 0–35)

## 2017-06-27 LAB — HEMOGLOBIN A1C: HEMOGLOBIN A1C: 6.6 % — AB (ref 4.6–6.5)

## 2017-06-27 NOTE — Telephone Encounter (Signed)
[facility] [employer] requesting Glucose Monitoring Supplies. See previous note for all supplies requested.   LOV: 01/10/17 Dr. Garry Heater [facility]

## 2017-06-27 NOTE — Telephone Encounter (Signed)
Received an order and it's in Dr. Marliss Coots inbox

## 2017-06-29 LAB — ANA: Anti Nuclear Antibody(ANA): NEGATIVE

## 2017-07-03 ENCOUNTER — Encounter: Payer: Self-pay | Admitting: Family Medicine

## 2017-07-03 ENCOUNTER — Ambulatory Visit: Payer: BC Managed Care – PPO | Admitting: Family Medicine

## 2017-07-03 VITALS — BP 130/74 | HR 80 | Temp 98.2°F | Ht 65.5 in | Wt 238.2 lb

## 2017-07-03 DIAGNOSIS — E669 Obesity, unspecified: Secondary | ICD-10-CM

## 2017-07-03 DIAGNOSIS — I10 Essential (primary) hypertension: Secondary | ICD-10-CM | POA: Diagnosis not present

## 2017-07-03 DIAGNOSIS — R0602 Shortness of breath: Secondary | ICD-10-CM | POA: Diagnosis not present

## 2017-07-03 DIAGNOSIS — E119 Type 2 diabetes mellitus without complications: Secondary | ICD-10-CM | POA: Diagnosis not present

## 2017-07-03 DIAGNOSIS — K51019 Ulcerative (chronic) pancolitis with unspecified complications: Secondary | ICD-10-CM

## 2017-07-03 DIAGNOSIS — E781 Pure hyperglyceridemia: Secondary | ICD-10-CM | POA: Diagnosis not present

## 2017-07-03 DIAGNOSIS — R0789 Other chest pain: Secondary | ICD-10-CM | POA: Insufficient documentation

## 2017-07-03 LAB — CBC WITH DIFFERENTIAL/PLATELET
BASOS PCT: 0.4 % (ref 0.0–3.0)
Basophils Absolute: 0 10*3/uL (ref 0.0–0.1)
Eosinophils Absolute: 0.3 10*3/uL (ref 0.0–0.7)
Eosinophils Relative: 4 % (ref 0.0–5.0)
HEMATOCRIT: 42.3 % (ref 36.0–46.0)
HEMOGLOBIN: 14.3 g/dL (ref 12.0–15.0)
LYMPHS PCT: 18.6 % (ref 12.0–46.0)
Lymphs Abs: 1.6 10*3/uL (ref 0.7–4.0)
MCHC: 33.8 g/dL (ref 30.0–36.0)
MCV: 94.2 fl (ref 78.0–100.0)
Monocytes Absolute: 0.6 10*3/uL (ref 0.1–1.0)
Monocytes Relative: 6.7 % (ref 3.0–12.0)
NEUTROS ABS: 6 10*3/uL (ref 1.4–7.7)
Neutrophils Relative %: 70.3 % (ref 43.0–77.0)
PLATELETS: 284 10*3/uL (ref 150.0–400.0)
RBC: 4.49 Mil/uL (ref 3.87–5.11)
RDW: 13.2 % (ref 11.5–15.5)
WBC: 8.5 10*3/uL (ref 4.0–10.5)

## 2017-07-03 LAB — TSH: TSH: 2.08 u[IU]/mL (ref 0.35–4.50)

## 2017-07-03 NOTE — Patient Instructions (Addendum)
We will refer you to cardiology for chest discomfort  If it worsens - get to the ER    Labs today   Keep working on healthy diet and exercise

## 2017-07-03 NOTE — Assessment & Plan Note (Signed)
Improved LDL and trig with atorvastatin  Disc goals for lipids and reasons to control them Rev last labs with pt Rev low sat fat diet in detail

## 2017-07-03 NOTE — Assessment & Plan Note (Signed)
With chest discomfort/funny feeling  Has vascular risk factors  EKG reassuring  Ref to cardiol for further eval  If worse- will go to ED and alert [place]

## 2017-07-03 NOTE — Progress Notes (Signed)
Subjective:    Patient ID: Jamie [name], female    DOB: 12/21/1955, 62 y.o.   MRN: [id number]  HPI Here for f/u of chronic medical problems   Taking care of herself   Wt Readings from Last 3 Encounters:  07/03/17 238 lb 4 oz (108.1 kg)  01/10/17 234 lb (106.1 kg)  12/25/16 236 lb (107 kg)  wt is up 4 lb  She went back to weight watchers in may / cannot keep up with it -not very compliant  Craves salty/crunchy stuff   (careful with veg) -with colostomy  A challenge  Not as much exercise - likes walking  39.04 kg/m   Some breathing changes Funny feeling in her chest -with exertion  Notices it walking up steps No radiation to arm or neck  Hollow feeling- little sob   Nl echo 2016 Nl ekg also   EKG today is NSR rate of 68  And no acute changes    bp is stable today  No cp or palpitations or headaches or edema  No side effects to medicines  BP Readings from Last 3 Encounters:  07/03/17 130/74  01/10/17 130/80  12/25/16 138/84      Pulse Readings from Last 3 Encounters:  07/03/17 80  01/10/17 73  12/25/16 84     DM2 Lab Results  Component Value Date   HGBA1C 6.6 (H) 06/27/2017  stable  Up from 6.4  Eye exam 8/18  120s fasting  No hypoglycemia   Hyperlipidemia Lab Results  Component Value Date   CHOL 133 06/27/2017   CHOL 137 01/08/2017   CHOL 163 07/21/2016   Lab Results  Component Value Date   HDL 49.80 06/27/2017   HDL 43.50 01/08/2017   HDL 47.50 07/21/2016   Lab Results  Component Value Date   LDLCALC 49 06/27/2017   LDLCALC 76 01/05/2015   LDLCALC 65 09/25/2012   Lab Results  Component Value Date   TRIG 169.0 (H) 06/27/2017   TRIG 201.0 (H) 01/08/2017   TRIG 215.0 (H) 07/21/2016   Lab Results  Component Value Date   CHOLHDL 3 06/27/2017   CHOLHDL 3 01/08/2017   CHOLHDL 3 07/21/2016   Lab Results  Component Value Date   LDLDIRECT 83.0 01/08/2017   LDLDIRECT 79.0 07/21/2016   LDLDIRECT 89.0 04/05/2016   atorvastatin 5 mg  every other day   Lab Results  Component Value Date   WBC 10.2 01/08/2017   HGB 13.5 01/08/2017   HCT 40.3 01/08/2017   MCV 95.3 01/08/2017   PLT 267.0 01/08/2017   Lab Results  Component Value Date   TSH 1.84 01/08/2017   Patient Active Problem List   Diagnosis Date Noted  . Chest discomfort 07/03/2017  . SOB (shortness of breath) on exertion 07/03/2017  . Ostomy nurse consultation 06/05/2017  . Lichen planus 63/87/5643  . Hypertriglyceridemia 04/10/2016  . Toe contusion 07/16/2015  . Obesity (BMI 30-39.9) 01/08/2015  . Red blood cell antibody positive 07/02/2013  . Elevated C-reactive protein (CRP) 05/15/2012  . Stricture of anal canal 11/21/2011  . Rectal discharge 10/02/2011  . Routine general medical examination at a health care facility 09/24/2011  . Apnea 11/14/2010  . Neck pain 11/14/2010  . Nevus of lower leg 11/14/2010  . Other screening mammogram 10/31/2010  . Stress reaction, emotional 05/02/2010  . Compulsive overeater 05/02/2010  . Arthritis   . H/O small bowel obstruction   . KNEE PAIN 10/14/2009  . HOARSENESS 07/21/2009  . Diabetes type  2, controlled ([place]) 04/23/2009  . ROSACEA 09/12/2007  . Essential hypertension 05/15/2007  . OSA (obstructive sleep apnea) 12/13/2006  . Ulcerative colitis ([place]) 07/12/2006   Past Medical History:  Diagnosis Date  . Arthritis    generalized  . Bowel obstruction (H[facility])    repeated (? possible adhesions) neg EGD  . Colitis    with colectomy  . Gall stones 2008  . Gastroenteritis 07/25/14   hosp for IVF   . HTN (hypertension)   . Hyperglycemia    mild-monitors A1C  . Obesity   . Rosacea   . Sleep apnea    CPAP everynight   Past Surgical History:  Procedure Laterality Date  . CHOLECYSTECTOMY    . COLECTOMY  1993   has ileostomy  . COLOSTOMY REVISION  12/06/2011   Procedure: COLOSTOMY REVISION;  Surgeon: Leighton Ruff, MD;  Location: [facility];  Service: General;  Laterality: Right;  OSTOMY  revision  . EXAMINATION UNDER ANESTHESIA  12/06/2011   Procedure: EXAM UNDER ANESTHESIA;  Surgeon: Leighton Ruff, MD;  Location[facility];  Service: General;  Laterality: N/A;  rectal exam under anesthesia, anal dilation, pouchoscopy    . TOTAL ABDOMINAL HYSTERECTOMY  05/2006   for abscessed ovaries   Social History   Tobacco Use  . Smoking status: Never Smoker  . Smokeless tobacco: Never Used  Substance Use Topics  . Alcohol use: No    Alcohol/week: 0.0 oz  . Drug use: No   Family History  Problem Relation Age of Onset  . Cancer Father        colon  . Liver cancer Father        resection secondary to mets  . Hyperlipidemia Father   . Hypertension Father   . Allergies Father   . Ulcerative colitis Father   . Hypertension Mother   . Hyperlipidemia Mother   . Cirrhosis Mother        NASH  . Diabetes Mother   . Allergies Brother   . Breast cancer Paternal Grandmother   . Breast cancer Cousin 68       paternal cousins   Allergies  Allergen Reactions  . Lisinopril     REACTION: felt bad/ stomach hurt/ weak and tired  . Ciprofloxacin Rash    Erythema and pruritis around IV site, erythema and rash along vein  . Sulfa Antibiotics Rash   Current Outpatient Medications on File Prior to Visit  Medication Sig Dispense Refill  . atorvastatin (LIPITOR) 10 MG tablet Take 1 tablet (10 mg total) by mouth daily. Take 1/2 pill by mouth every other day 16 tablet 11  . AZO-CRANBERRY PO Take 2 capsules by mouth daily.    . Blood Glucose Monitoring Suppl (GLUCOMETER ELITE CLASSIC) KIT by Does not apply route daily. Check sugar once daily and as needed for DM2 250.0     . CINNAMON PO Take 1,000 mg by mouth daily.    . fluticasone (FLOVENT HFA) 220 MCG/ACT inhaler APPLY 2 PUFFS TOPICALLY TO CHOLOSTOMY SITE EVERY DAY AS DIRECTED 12 g 5  . glucose blood test strip 1 each by Other route daily. Reported on 07/16/2015    . hyoscyamine (LEVSIN SL) 0.125 MG SL tablet Take 1  tablet (0.125 mg total) by mouth every 4 (four) hours as needed. For abdominal cramps 30 tablet 11  . ibuprofen (ADVIL,MOTRIN) 200 MG tablet Take 200 mg by mouth every 6 (six) hours as needed (for arthritis).     Marland Kitchen  metFORMIN (GLUCOPHAGE) 500 MG tablet TAKE 1 TABLET (500 MG TOTAL) BY MOUTH 2 (TWO) TIMES DAILY WITH A MEAL. 60 tablet 0  . metoprolol tartrate (LOPRESSOR) 25 MG tablet TAKE 1 TABLET (25 MG TOTAL) BY MOUTH DAILY. 90 tablet 3  . Multiple Vitamin (MULTIVITAMIN) tablet Take 1 tablet by mouth daily.      . naproxen sodium (ANAPROX) 220 MG tablet Take 220 mg by mouth daily.     . NON FORMULARY SLEEPS WITH BI-PAP    . omeprazole (PRILOSEC) 20 MG capsule Take 20 mg by mouth every other day.     . ondansetron (ZOFRAN ODT) 4 MG disintegrating tablet Take 1 tablet (4 mg total) by mouth every 4 (four) hours as needed for nausea or vomiting. 20 tablet 0  . zolpidem (AMBIEN) 10 MG tablet Take 0.5-1 tablets (5-10 mg total) by mouth at bedtime as needed for sleep. TAKE 1 TABLET BY MOUTH AT BEDTIME AS NEEDED 30 tablet 0   No current facility-administered medications on file prior to visit.     Review of Systems  Constitutional: Negative for activity change, appetite change, fatigue, fever and unexpected weight change.  HENT: Negative for congestion, ear pain, rhinorrhea, sinus pressure and sore throat.   Eyes: Negative for pain, redness and visual disturbance.  Respiratory: Positive for shortness of breath. Negative for cough and wheezing.   Cardiovascular: Negative for chest pain, palpitations and leg swelling.       Funny feeling in chest on exertion   Gastrointestinal: Positive for diarrhea. Negative for abdominal pain, blood in stool and constipation.  Endocrine: Negative for polydipsia and polyuria.  Genitourinary: Negative for dysuria, frequency and urgency.  Musculoskeletal: Negative for arthralgias, back pain and myalgias.  Skin: Negative for pallor and rash.  Allergic/Immunologic:  Negative for environmental allergies.  Neurological: Negative for dizziness, syncope and headaches.  Hematological: Negative for adenopathy. Does not bruise/bleed easily.  Psychiatric/Behavioral: Negative for decreased concentration and dysphoric mood. The patient is not nervous/anxious.        Objective:   Physical Exam  Constitutional: She appears well-developed and well-nourished. No distress.  obese and well appearing   HENT:  Head: Normocephalic and atraumatic.  Mouth/Throat: Oropharynx is clear and moist.  Eyes: Pupils are equal, round, and reactive to light. Conjunctivae and EOM are normal.  Neck: Normal range of motion. Neck supple. No JVD present. Carotid bruit is not present. No thyromegaly present.  Cardiovascular: Normal rate, regular rhythm, normal heart sounds and intact distal pulses. Exam reveals no gallop.  Pulmonary/Chest: Effort normal and breath sounds normal. No stridor. No respiratory distress. She has no wheezes. She has no rales. She exhibits no tenderness.  No crackles  Abdominal: Soft. Bowel sounds are normal. She exhibits no distension, no abdominal bruit and no mass. There is no tenderness.  Baseline ostomy-site looks healthy  Musculoskeletal: She exhibits no edema or tenderness.  Lymphadenopathy:    She has no cervical adenopathy.  Neurological: She is alert. She has normal reflexes. She displays normal reflexes. No cranial nerve deficit. She exhibits normal muscle tone. Coordination normal.  Skin: Skin is warm and dry. No rash noted. No erythema. No pallor.  Psychiatric: She has a normal mood and affect.  Pleasant           Assessment & Plan:   Problem List Items Addressed This Visit      Cardiovascular and Mediastinum   Essential hypertension    bp in fair control at this time  BP Readings from  Last 1 Encounters:  07/03/17 130/74   No changes needed Most recent labs reviewed  Disc lifstyle change with low sodium diet and exercise         Relevant Orders   CBC with Differential/Platelet (Completed)   TSH (Completed)     Digestive   Ulcerative colitis ([place])    No clinical changes With colostomy -has seen nurse that was helpful        Endocrine   Diabetes type 2, controlled ([place])    Lab Results  Component Value Date   HGBA1C 6.6 (H) 06/27/2017   Well controlled Disc eye and foot care  No change in tx          Other   Chest discomfort - Primary    Pt c/o of exertional chest discomfort (funny feeling) w/o radiation / but with some sob -esp with stairs  Improves with rest Disc cardiac risk factors Reassuring exam and EKG today  Ref to cardiology for further eval and likely stress testing Lab for tsh/cbc today also      Relevant Orders   EKG 12-Lead (Completed)   Ambulatory referral to Cardiology   Hypertriglyceridemia    Improved LDL and trig with atorvastatin  Disc goals for lipids and reasons to control them Rev last labs with pt Rev low sat fat diet in detail       Obesity (BMI 30-39.9)    Discussed how this problem influences overall health and the risks it imposes  Reviewed plan for weight loss with lower calorie diet (via better food choices and also portion control or program like weight watchers) and exercise building up to or more than 30 minutes 5 days per week including some aerobic activity         SOB (shortness of breath) on exertion    With chest discomfort/funny feeling  Has vascular risk factors  EKG reassuring  Ref to cardiol for further eval  If worse- will go to ED and alert [place]       Relevant Orders   CBC with Differential/Platelet (Completed)    Other Visit Diagnoses    Morbid obesity ([place])   (Chronic)

## 2017-07-03 NOTE — Assessment & Plan Note (Signed)
Lab Results  Component Value Date   HGBA1C 6.6 (H) 06/27/2017   Well controlled Disc eye and foot care  No change in tx

## 2017-07-03 NOTE — Assessment & Plan Note (Signed)
bp in fair control at this time  BP Readings from Last 1 Encounters:  07/03/17 130/74   No changes needed Most recent labs reviewed  Disc lifstyle change with low sodium diet and exercise

## 2017-07-03 NOTE — Assessment & Plan Note (Signed)
Pt c/o of exertional chest discomfort (funny feeling) w/o radiation / but with some sob -esp with stairs  Improves with rest Disc cardiac risk factors Reassuring exam and EKG today  Ref to cardiology for further eval and likely stress testing Lab for tsh/cbc today also

## 2017-07-03 NOTE — Assessment & Plan Note (Signed)
Discussed how this problem influences overall health and the risks it imposes  Reviewed plan for weight loss with lower calorie diet (via better food choices and also portion control or program like weight watchers) and exercise building up to or more than 30 minutes 5 days per week including some aerobic activity    

## 2017-07-03 NOTE — Assessment & Plan Note (Signed)
No clinical changes With colostomy -has seen nurse that was helpful

## 2017-07-05 ENCOUNTER — Ambulatory Visit: Payer: BC Managed Care – PPO | Admitting: Cardiovascular Disease

## 2017-07-05 ENCOUNTER — Encounter: Payer: Self-pay | Admitting: Cardiovascular Disease

## 2017-07-05 VITALS — BP 123/67 | HR 73 | Ht 65.0 in | Wt 237.5 lb

## 2017-07-05 DIAGNOSIS — R002 Palpitations: Secondary | ICD-10-CM | POA: Insufficient documentation

## 2017-07-05 DIAGNOSIS — K51019 Ulcerative (chronic) pancolitis with unspecified complications: Secondary | ICD-10-CM

## 2017-07-05 DIAGNOSIS — E119 Type 2 diabetes mellitus without complications: Secondary | ICD-10-CM

## 2017-07-05 DIAGNOSIS — R0789 Other chest pain: Secondary | ICD-10-CM | POA: Diagnosis not present

## 2017-07-05 DIAGNOSIS — I10 Essential (primary) hypertension: Secondary | ICD-10-CM

## 2017-07-05 NOTE — Patient Instructions (Addendum)
Medication Instructions:   No medication changes made  Labwork:  No new labs needed  Testing/Procedures:  Your physician has requested that you have a stress echocardiogram. For further information please visit [email or web address]. Please follow instruction sheet as given.   Do not drink or eat foods with caffeine for 24 hours before the test. (Chocolate, coffee, tea, or energy drinks)  If you use an inhaler, bring it with you to the test.  Do not smoke for 4 hours before the test.  Wear comfortable shoes and clothing.  HOLD Metoprolol the night before and the morning of procedure  Follow-Up: It was a pleasure seeing you in the office today. Please call us if you have new issues that need to be addressed before your next appt.  [phone]  Your physician wants you to follow-up in:  As needed  If you need a refill on your cardiac medications before your next appointment, please call your pharmacy.  For educational health videos Log in to : [email or web address] Or : [email or web address], password : [email or web address]

## 2017-07-05 NOTE — Progress Notes (Signed)
Cardiology Office Note  Date:  07/05/2017   ID:  Jamie [name], DOB [birth year], MRN [id number]  PCP:  Abner Greenspan, MD   Chief Complaint  Patient presents with  . other    Chest discomfort c/o sob with exertion. Refused EKG. Meds reviewed verbally with pt.    HPI:  [name] is a 62 year old woman with past medical history of obesity Hypertension Diabetes type 2, hemoglobin A1c 6.6 Sleep apnea Hyperlipidemia Nonsmoker,  Hx of UC, coloectomy 1993 Who presents by referral from Dr. Rica Mote for consultation of her chest discomfort shortness of breath palpitations  She reports that over the past several weeks to months she has appreciated a  funny feeling in her chest typically presenting with exertion, especially with stairs Sometimes walking on flat surface, always with exertion No radiation to arm or neck  Hollow feeling,  little sob  Slight fluttering in her upper chest  Denies any significant symptoms at rest  Previous echocardiogram 2016, normal study ejection fraction 60%  Lab work reviewed Total cholesterol 133 LDL 49 Hemoglobin A1c 6.6  EKG personally reviewed by myself on todays visit Reviewed from 07/03/2017 showing normal sinus rhythm with no significant ST or T-wave changes   PMH:   has a past medical history of Arthritis, Bowel obstruction ([place]), Colitis, Gall stones (2008), Gastroenteritis (07/25/14), HTN (hypertension), Hyperglycemia, Obesity, Rosacea, and Sleep apnea.  PSH:    Past Surgical History:  Procedure Laterality Date  . CHOLECYSTECTOMY    . COLECTOMY  1993   has ileostomy  . COLOSTOMY REVISION  12/06/2011   Procedure: COLOSTOMY REVISION;  Surgeon: Leighton Ruff, MD;  [facility];  Service: General;  Laterality: Right;  OSTOMY revision  . EXAMINATION UNDER ANESTHESIA  12/06/2011   Procedure: EXAM UNDER ANESTHESIA;  Surgeon: Leighton Ruff, MD;  [facility];  Service: General;   Laterality: N/A;  rectal exam under anesthesia, anal dilation, pouchoscopy    . TOTAL ABDOMINAL HYSTERECTOMY  05/2006   for abscessed ovaries    Current Outpatient Medications  Medication Sig Dispense Refill  . atorvastatin (LIPITOR) 10 MG tablet Take 1 tablet (10 mg total) by mouth daily. Take 1/2 pill by mouth every other day (Patient taking differently: Take 1/2 pill by mouth every other day) 16 tablet 11  . Blood Glucose Monitoring Suppl (GLUCOMETER ELITE CLASSIC) KIT by Does not apply route daily. Check sugar once daily and as needed for DM2 250.0     . CINNAMON PO Take 1,000 mg by mouth daily.    Marland Kitchen glucose blood test strip 1 each by Other route daily. Reported on 07/16/2015    . hyoscyamine (LEVSIN SL) 0.125 MG SL tablet Take 1 tablet (0.125 mg total) by mouth every 4 (four) hours as needed. For abdominal cramps 30 tablet 11  . ibuprofen (ADVIL,MOTRIN) 200 MG tablet Take 200 mg by mouth every 6 (six) hours as needed (for arthritis).     . metFORMIN (GLUCOPHAGE) 500 MG tablet TAKE 1 TABLET (500 MG TOTAL) BY MOUTH 2 (TWO) TIMES DAILY WITH A MEAL. 60 tablet 0  . metoprolol tartrate (LOPRESSOR) 25 MG tablet TAKE 1 TABLET (25 MG TOTAL) BY MOUTH DAILY. 90 tablet 3  . Multiple Vitamin (MULTIVITAMIN) tablet Take 1 tablet by mouth daily.      . naproxen sodium (ANAPROX) 220 MG tablet Take 220 mg by mouth daily.     . NON FORMULARY SLEEPS WITH BI-PAP    . omeprazole (PRILOSEC)  20 MG capsule Take 20 mg by mouth every other day.     . ondansetron (ZOFRAN ODT) 4 MG disintegrating tablet Take 1 tablet (4 mg total) by mouth every 4 (four) hours as needed for nausea or vomiting. 20 tablet 0  . zolpidem (AMBIEN) 10 MG tablet Take 0.5-1 tablets (5-10 mg total) by mouth at bedtime as needed for sleep. TAKE 1 TABLET BY MOUTH AT BEDTIME AS NEEDED 30 tablet 0   No current facility-administered medications for this visit.      Allergies:   Lisinopril; Ciprofloxacin; and Sulfa antibiotics   Social History:   The patient  reports that she has never smoked. She has never used smokeless tobacco. She reports that she does not drink alcohol or use drugs.   Family History:   family history includes Allergies in her brother and father; Breast cancer in her paternal grandmother; Breast cancer (age of onset: 35) in her cousin; Cancer in her father; Cirrhosis in her mother; Diabetes in her mother; Hyperlipidemia in her father and mother; Hypertension in her father and mother; Liver cancer in her father; Ulcerative colitis in her father.    Review of Systems: Review of Systems  Constitutional: Negative.   Respiratory: Negative.   Cardiovascular: Negative.   Gastrointestinal: Negative.   Musculoskeletal: Negative.   Neurological: Negative.   Psychiatric/Behavioral: Negative.   All other systems reviewed and are negative.    PHYSICAL EXAM: VS:  BP 123/67 (BP Location: Left Arm, Patient Position: Sitting, Cuff Size: Large)   Pulse 73   Ht 5' 5"  (1.651 m)   Wt 237 lb 8 oz (107.7 kg)   BMI 39.52 kg/m  , BMI Body mass index is 39.52 kg/m. GEN: Well nourished, well developed, in no acute distress  HEENT: normal  Neck: no JVD, carotid bruits, or masses Cardiac: RRR; no murmurs, rubs, or gallops,no edema  Respiratory:  clear to auscultation bilaterally, normal work of breathing GI: soft, nontender, nondistended, + BS MS: no deformity or atrophy  Skin: warm and dry, no rash Neuro:  Strength and sensation are intact Psych: euthymic mood, full affect   Recent Labs: 01/08/2017: BUN 15; Creatinine, Ser 1.07; Potassium 4.3; Sodium 134 06/27/2017: ALT 19 07/03/2017: Hemoglobin 14.3; Platelets 284.0; TSH 2.08    Lipid Panel Lab Results  Component Value Date   CHOL 133 06/27/2017   HDL 49.80 06/27/2017   LDLCALC 49 06/27/2017   TRIG 169.0 (H) 06/27/2017      Wt Readings from Last 3 Encounters:  07/05/17 237 lb 8 oz (107.7 kg)  07/03/17 238 lb 4 oz (108.1 kg)  01/10/17 234 lb (106.1 kg)        ASSESSMENT AND PLAN:  Controlled type 2 diabetes mellitus without complication, without long-term current use of insulin ([place]) We have encouraged continued exercise, careful diet management in an effort to lose weight.  Ulcerative pancolitis with complication ([place]) Prior colectomy, difficulty staying hydrated Normal BMP several months ago, reviewed with her  Essential hypertension Blood pressure is well controlled on today's visit. No changes made to the medications.  Chest tightness Some risk factors including diabetes,  No significant family history Normal clinical exam and EKG unable to exclude ischemia or arrhythmia as a cause of her symptoms Recommended treadmill stress echo to rule out ischemia If normal study would start a regular exercise program, with aerobic activity for conditioning  Palpitations Etiology unclear, unable to exclude arrhythmia We have ordered a treadmill stress echo as above to reproduce her symptoms Unable  to exclude ectopy or other arrhythmia If symptoms get worse, long-term monitor could be ordered Currently on beta blocker  Disposition:   F/U as needed  Patient was seen in consultation for Dr. Rica Mote and will be referred back to her office for ongoing care of issues detailed above   Total encounter time more than 60 minutes  Greater than 50% was spent in counseling and coordination of care with the patient   No orders of the defined types were placed in this encounter.    Signed, Esmond Plants, M.D., Ph.D. 07/05/2017  [place], [place]

## 2017-07-10 NOTE — Addendum Note (Signed)
Addended by: Valora Corporal on: 07/10/2017 02:17 PM   Modules accepted: Orders

## 2017-07-17 ENCOUNTER — Telehealth: Payer: Self-pay | Admitting: *Deleted

## 2017-07-17 ENCOUNTER — Ambulatory Visit (INDEPENDENT_AMBULATORY_CARE_PROVIDER_SITE_OTHER): Payer: BC Managed Care – PPO

## 2017-07-17 DIAGNOSIS — R002 Palpitations: Secondary | ICD-10-CM | POA: Diagnosis not present

## 2017-07-17 DIAGNOSIS — R0789 Other chest pain: Secondary | ICD-10-CM | POA: Diagnosis not present

## 2017-07-17 NOTE — Telephone Encounter (Signed)
Called patient and she confirmed she will be here today at 1pm for her stress echo. She confirmed she has not taken her metoprolol last night or this morning. She verbalized understanding of pre-procedural instructions.

## 2017-07-18 ENCOUNTER — Other Ambulatory Visit: Payer: Self-pay | Admitting: *Deleted

## 2017-07-18 LAB — ECHOCARDIOGRAM STRESS TEST
CHL CUP MPHR: 157 {beats}/min
CHL CUP RESTING HR STRESS: 105 {beats}/min
CHL RATE OF PERCEIVED EXERTION: 16
CSEPEDS: 0 s
Estimated workload: 5.8 METS
Exercise duration (min): 4 min
Peak HR: 162 {beats}/min
Percent HR: 103 %

## 2017-07-18 MED ORDER — METFORMIN HCL 500 MG PO TABS: 500.0000 mg | ORAL_TABLET | Freq: Two times a day (BID) | ORAL | 5 refills | Status: DC

## 2017-08-10 ENCOUNTER — Telehealth: Payer: Self-pay | Admitting: *Deleted

## 2017-08-10 NOTE — Telephone Encounter (Signed)
Faxed form received from [facility] for ostomy supplies.  Form is placed in Dr. Marliss Coots In [place].

## 2017-08-10 NOTE — Telephone Encounter (Signed)
Signed and in IN box

## 2017-08-13 NOTE — Telephone Encounter (Addendum)
Signed form faxed to [facility].

## 2017-11-02 LAB — HM DIABETES EYE EXAM

## 2017-11-15 ENCOUNTER — Encounter: Payer: Self-pay | Admitting: Family Medicine

## 2017-11-30 ENCOUNTER — Encounter: Payer: Self-pay | Admitting: Family Medicine

## 2017-11-30 ENCOUNTER — Ambulatory Visit: Payer: BC Managed Care – PPO | Admitting: Family Medicine

## 2017-11-30 VITALS — BP 134/72 | HR 79 | Temp 97.5°F | Ht 65.0 in | Wt 249.2 lb

## 2017-11-30 DIAGNOSIS — H9201 Otalgia, right ear: Secondary | ICD-10-CM | POA: Diagnosis not present

## 2017-11-30 DIAGNOSIS — M25512 Pain in left shoulder: Secondary | ICD-10-CM | POA: Diagnosis not present

## 2017-11-30 MED ORDER — AMOXICILLIN-POT CLAVULANATE 875-125 MG PO TABS: 1.0000 | ORAL_TABLET | Freq: Two times a day (BID) | ORAL | 0 refills | Status: DC

## 2017-11-30 NOTE — Patient Instructions (Addendum)
Your exam looks good - I don't think you have a sinus infection or an ear infection  ? Perhaps this is dental  Follow up with your dentist  If pain gets severe -fill and start augmentin   If you feel congested or ears feel full-try flonase otc daily   Take aleve twice daily with food for inflammation (shoulder and also ear )   We will see if we can schedule you with sport medicine

## 2017-11-30 NOTE — Progress Notes (Signed)
Subjective:    Patient ID: Jamie [name], female    DOB: July 30, 1955, 62 y.o.   MRN: [id number]  HPI Here for ear and sinus problem and L shoulder pain   R ear aches - pain rad into throat/jaw area  Also problem with upper molar on that side (was told to make sure she does not have a sinus infection)  When she bites down - it sends pain to that side   No fever  Has pnd -worse in the last day  Not a lot of nasal d/c -and it is clear  Tried mucinex - made nose runny- also clear Not sneezing  Throat is fine   Cough-none   Feels like her head is full   husb has a sore throat currently    Wt Readings from Last 3 Encounters:  11/30/17 249 lb 4 oz (113.1 kg)  07/05/17 237 lb 8 oz (107.7 kg)  07/03/17 238 lb 4 oz (108.1 kg)   41.48 kg/m   L shoulder pain  Ache Almost 2 months  Hurts to abduct arm and reach above head or dry hair  Used asper cream  Takes 1 aleve once daily    Patient Active Problem List   Diagnosis Date Noted  . Otalgia, right ear 11/30/2017  . Left shoulder pain 11/30/2017  . Chest tightness 07/05/2017  . Palpitations 07/05/2017  . Chest discomfort 07/03/2017  . Shortness of breath 07/03/2017  . Ostomy nurse consultation 06/05/2017  . Lichen planus 01/31/3233  . Hypertriglyceridemia 04/10/2016  . Toe contusion 07/16/2015  . Obesity (BMI 30-39.9) 01/08/2015  . Red blood cell antibody positive 07/02/2013  . Elevated C-reactive protein (CRP) 05/15/2012  . Stricture of anal canal 11/21/2011  . Rectal discharge 10/02/2011  . Routine general medical examination at a health care facility 09/24/2011  . Apnea 11/14/2010  . Neck pain 11/14/2010  . Nevus of lower leg 11/14/2010  . Other screening mammogram 10/31/2010  . Stress reaction, emotional 05/02/2010  . Compulsive overeater 05/02/2010  . Arthritis   . H/O small bowel obstruction   . KNEE PAIN 10/14/2009  . HOARSENESS 07/21/2009  . Diabetes type 2, controlled ([place]) 04/23/2009  . ROSACEA  09/12/2007  . Essential hypertension 05/15/2007  . OSA (obstructive sleep apnea) 12/13/2006  . Ulcerative colitis ([place]) 07/12/2006   Past Medical History:  Diagnosis Date  . Arthritis    generalized  . Bowel obstruction (H[facility])    repeated (? possible adhesions) neg EGD  . Colitis    with colectomy  . Gall stones 2008  . Gastroenteritis 07/25/14   hosp for IVF   . HTN (hypertension)   . Hyperglycemia    mild-monitors A1C  . Obesity   . Rosacea   . Sleep apnea    CPAP everynight   Past Surgical History:  Procedure Laterality Date  . CHOLECYSTECTOMY    . COLECTOMY  1993   has ileostomy  . COLOSTOMY REVISION  12/06/2011   Procedure: COLOSTOMY REVISION;  Surgeon: Leighton Ruff, MD;  Location: [facility];  Service: General;  Laterality: Right;  OSTOMY revision  . EXAMINATION UNDER ANESTHESIA  12/06/2011   Procedure: EXAM UNDER ANESTHESIA;  Surgeon: Leighton Ruff, MD;  Location: [place];  Service: General;  Laterality: N/A;  rectal exam under anesthesia, anal dilation, pouchoscopy    . TOTAL ABDOMINAL HYSTERECTOMY  05/2006   for abscessed ovaries   Social History   Tobacco Use  . Smoking status: Never Smoker  .  Smokeless tobacco: Never Used  Substance Use Topics  . Alcohol use: No    Alcohol/week: 0.0 standard drinks  . Drug use: No   Family History  Problem Relation Age of Onset  . Cancer Father        colon  . Liver cancer Father        resection secondary to mets  . Hyperlipidemia Father   . Hypertension Father   . Allergies Father   . Ulcerative colitis Father   . Hypertension Mother   . Hyperlipidemia Mother   . Cirrhosis Mother        NASH  . Diabetes Mother   . Allergies Brother   . Breast cancer Paternal Grandmother   . Breast cancer Cousin 81       paternal cousins   Allergies  Allergen Reactions  . Lisinopril     REACTION: felt bad/ stomach hurt/ weak and tired  . Ciprofloxacin Rash    Erythema and pruritis  around IV site, erythema and rash along vein  . Sulfa Antibiotics Rash   Current Outpatient Medications on File Prior to Visit  Medication Sig Dispense Refill  . atorvastatin (LIPITOR) 10 MG tablet Take 1 tablet (10 mg total) by mouth daily. Take 1/2 pill by mouth every other day (Patient taking differently: Take 1/2 pill by mouth every other day) 16 tablet 11  . Blood Glucose Monitoring Suppl (GLUCOMETER ELITE CLASSIC) KIT by Does not apply route daily. Check sugar once daily and as needed for DM2 250.0     . CINNAMON PO Take 1,000 mg by mouth daily.    . Esomeprazole Magnesium (NEXIUM PO) Take 1 tablet by mouth every other day.    Marland Kitchen glucose blood test strip 1 each by Other route daily. Reported on 07/16/2015    . hyoscyamine (LEVSIN SL) 0.125 MG SL tablet Take 1 tablet (0.125 mg total) by mouth every 4 (four) hours as needed. For abdominal cramps 30 tablet 11  . ibuprofen (ADVIL,MOTRIN) 200 MG tablet Take 200 mg by mouth every 6 (six) hours as needed (for arthritis).     . metFORMIN (GLUCOPHAGE) 500 MG tablet Take 1 tablet (500 mg total) by mouth 2 (two) times daily with a meal. 60 tablet 5  . metoprolol tartrate (LOPRESSOR) 25 MG tablet TAKE 1 TABLET (25 MG TOTAL) BY MOUTH DAILY. 90 tablet 3  . Multiple Vitamin (MULTIVITAMIN) tablet Take 1 tablet by mouth daily.      . naproxen sodium (ANAPROX) 220 MG tablet Take 220 mg by mouth daily.     . NON FORMULARY SLEEPS WITH BI-PAP    . ondansetron (ZOFRAN ODT) 4 MG disintegrating tablet Take 1 tablet (4 mg total) by mouth every 4 (four) hours as needed for nausea or vomiting. 20 tablet 0  . zolpidem (AMBIEN) 10 MG tablet Take 0.5-1 tablets (5-10 mg total) by mouth at bedtime as needed for sleep. TAKE 1 TABLET BY MOUTH AT BEDTIME AS NEEDED 30 tablet 0   No current facility-administered medications on file prior to visit.     Review of Systems  Constitutional: Negative for activity change, appetite change, fatigue, fever and unexpected weight change.   HENT: Positive for congestion, dental problem, ear pain, postnasal drip and rhinorrhea. Negative for ear discharge, hearing loss, sinus pressure, sinus pain and sore throat.   Eyes: Negative for pain, redness and visual disturbance.  Respiratory: Negative for cough, shortness of breath and wheezing.   Cardiovascular: Negative for chest pain and palpitations.  Gastrointestinal: Negative for abdominal pain, blood in stool, constipation and diarrhea.  Endocrine: Negative for polydipsia and polyuria.  Genitourinary: Negative for dysuria, frequency and urgency.  Musculoskeletal: Negative for arthralgias, back pain and myalgias.  Skin: Negative for pallor and rash.  Allergic/Immunologic: Negative for environmental allergies.  Neurological: Negative for dizziness, syncope, light-headedness, numbness and headaches.       No pain in dental area   Hematological: Negative for adenopathy. Does not bruise/bleed easily.  Psychiatric/Behavioral: Negative for decreased concentration and dysphoric mood. The patient is not nervous/anxious.        Objective:   Physical Exam  Constitutional: She appears well-developed and well-nourished. No distress.  obese and well appearing   HENT:  Head: Normocephalic and atraumatic.  Right Ear: External ear normal.  Left Ear: External ear normal.  Mouth/Throat: Oropharynx is clear and moist.  Nares are boggy   TMs- nl appearing with slight retraction bilat-no eff or erythema   Sensitive upper molar with crown - R upper jaw No TMJ tenderness No temple tenderness   Eyes: Pupils are equal, round, and reactive to light. Conjunctivae and EOM are normal. Right eye exhibits no discharge. Left eye exhibits no discharge. No scleral icterus.  Neck: Normal range of motion. Neck supple.  Cardiovascular: Normal rate, regular rhythm and normal heart sounds.  Pulmonary/Chest: Effort normal and breath sounds normal. No stridor. No respiratory distress. She has no wheezes.    Musculoskeletal:       Left shoulder: She exhibits decreased range of motion and tenderness. She exhibits no bony tenderness, no swelling, no effusion and no crepitus.  L shoulder Tender over bicep tendon  equivocal Hawking test Pos Neer for lateral shoulder pain  Some pain with ext rotation Nl int rotation  Pain with full abduction   Lymphadenopathy:    She has no cervical adenopathy.  Neurological: She is alert. No cranial nerve deficit.  Skin: Skin is warm and dry. No rash noted. No erythema.  Psychiatric: She has a normal mood and affect.          Assessment & Plan:   Problem List Items Addressed This Visit      Other   Left shoulder pain    Suspect tendonitis Bicep tendon findings on exam  Recommend ice Relative rest and rom exercises  Will schedule with sport med       Otalgia, right ear - Primary    Nl ear exam  Dose have nasal congestion - ? If ETD Also dental issues- sensitivity in R upper molar  Recommend f/u dentist  Warm/cool compress Watch for fever or other symptoms Px augmentin = to hold in case symptoms worsen over wkend ( ? If poss dental abscess)  Will update Update if not starting to improve in a week or if worsening

## 2017-12-02 NOTE — Assessment & Plan Note (Signed)
Suspect tendonitis Bicep tendon findings on exam  Recommend ice Relative rest and rom exercises  Will schedule with sport med

## 2017-12-02 NOTE — Assessment & Plan Note (Signed)
Nl ear exam  Dose have nasal congestion - ? If ETD Also dental issues- sensitivity in R upper molar  Recommend f/u dentist  Warm/cool compress Watch for fever or other symptoms Px augmentin = to hold in case symptoms worsen over wkend ( ? If poss dental abscess)  Will update Update if not starting to improve in a week or if worsening

## 2017-12-05 ENCOUNTER — Ambulatory Visit: Payer: BC Managed Care – PPO | Admitting: Sports Medicine

## 2017-12-05 ENCOUNTER — Ambulatory Visit: Payer: Self-pay

## 2017-12-05 ENCOUNTER — Ambulatory Visit (INDEPENDENT_AMBULATORY_CARE_PROVIDER_SITE_OTHER): Payer: BC Managed Care – PPO

## 2017-12-05 ENCOUNTER — Encounter: Payer: Self-pay | Admitting: Sports Medicine

## 2017-12-05 VITALS — BP 124/82 | HR 78 | Ht 65.0 in | Wt 245.6 lb

## 2017-12-05 DIAGNOSIS — M25512 Pain in left shoulder: Secondary | ICD-10-CM

## 2017-12-05 DIAGNOSIS — M7552 Bursitis of left shoulder: Secondary | ICD-10-CM

## 2017-12-05 NOTE — Procedures (Signed)
PROCEDURE NOTE:  Ultrasound Guided: Injection: Left shoulder Images were obtained and interpreted by myself, Teresa Coombs, DO  Images have been saved and stored to PACS system. Images obtained on: GE S7 Ultrasound machine    ULTRASOUND FINDINGS:  Biceps tendon, subscap, supraspinatus tendons are intact but supraspinatus and some scab are slightly thickened with no evidence of overt tearing but likely interstitial tear of the supraspinatus with chronic Tendinopathic thickening.  There is a subacromial bursa appreciated and this was the target of the injection today.  DESCRIPTION OF PROCEDURE:  The patient's clinical condition is marked by substantial pain and/or significant functional disability. Other conservative therapy has not provided relief, is contraindicated, or not appropriate. There is a reasonable likelihood that injection will significantly improve the patient's pain and/or functional impairment.   After discussing the risks, benefits and expected outcomes of the injection and all questions were reviewed and answered, the patient wished to undergo the above named procedure.  Verbal consent was obtained.  The ultrasound was used to identify the target structure and adjacent neurovascular structures. The skin was then prepped in sterile fashion and the target structure was injected under direct visualization using sterile technique as below:  Single injection performed as below: PREP: Alcohol and Ethel Chloride APPROACH:posterior, single injection, 21g 2 in. INJECTATE: 2 cc 0.5% Marcaine and 2 cc 55m/mL DepoMedrol ASPIRATE: None DRESSING: Band-Aid  Post procedural instructions including recommending icing and warning signs for infection were reviewed.    This procedure was well tolerated and there were no complications.   IMPRESSION: Succesful Ultrasound Guided: Injection

## 2017-12-05 NOTE — Progress Notes (Signed)
Jamie [name]. Candler Ginsberg, [facility] at [facility]  Jamie [name] - 62 y.o. female MRN [id number]  Date of birth: [birth year]  Visit Date: 12/05/2017  PCP: Abner Greenspan, MD   Referred by: Tower, Wynelle Fanny, MD   Scribe(s) for today's visit: Josepha Pigg, CMA  SUBJECTIVE:  Jamie [name] is here for Initial Assessment (L shoulder pain)   HPI: 12/05/2017: Her L shoulder pain symptoms INITIALLY: Began about 6 weeks ago and MOI is unknown. She injured her neck 12-15 yrs ago after falling in the courtroom, she was seen by chiropractor for this in the past. She is R hand dominant.  Described as mild constant dull ache at rest, sharp pain when raising the arm, radiating to the L arm.  Worsened with raising overhead, reaching across her body. Minimal pain when reaching down and back.  Improved with rest.  Additional associated symptoms include: She denies clicking, popping, catching. Pain is mostly lateral. She denies n/t in the arm. She c/o decreased ROM in the shoulder and neck. Sx flare up when she is crocheting. Pt is diabetic.     At this time symptoms are worsening compared to onset. She has been taking Aleve BID with some relief. Heat from the shower seems to help. She has been trying to rest as much as possible.   No recent XR of the neck or shoulder.   REVIEW OF SYSTEMS: Reports night time disturbances, not as bad when taking Aleve BID. Denies fevers, chills, or night sweats. Denies unexplained weight loss. Denies personal history of cancer. Denies changes in bowel or bladder habits. Denies recent unreported falls. Denies new or worsening dyspnea or wheezing. Denies headaches or dizziness.  Denies numbness, tingling or weakness in the extremities.  Denies dizziness or presyncopal episodes Denies lower extremity edema    HISTORY:  Prior history reviewed and updated per electronic medical record.  Social History    Occupational History  . Occupation: Guardian [employer]  Tobacco Use  . Smoking status: Never Smoker  . Smokeless tobacco: Never Used  Substance and Sexual Activity  . Alcohol use: No    Alcohol/week: 0.0 standard drinks  . Drug use: No  . Sexual activity: Not on file   Social History   Social History Narrative  . Not on file    Past Medical History:  Diagnosis Date  . Arthritis    generalized  . Bowel obstruction (HCC)    repeated (? possible adhesions) neg EGD  . Colitis    with colectomy  . Gall stones 2008  . Gastroenteritis 07/25/14   hosp for IVF   . HTN (hypertension)   . Hyperglycemia    mild-monitors A1C  . Obesity   . Rosacea   . Sleep apnea    CPAP everynight   Past Surgical History:  Procedure Laterality Date  . CHOLECYSTECTOMY    . COLECTOMY  1993   has ileostomy  . COLOSTOMY REVISION  12/06/2011   Procedure: COLOSTOMY REVISION;  Surgeon: Leighton Ruff, MD;  Location[facility];  Service: General;  Laterality: Right;  OSTOMY revision  . EXAMINATION UNDER ANESTHESIA  12/06/2011   Procedure: EXAM UNDER ANESTHESIA;  Surgeon: Leighton Ruff, MD;  Location: [facility];  Service: General;  Laterality: N/A;  rectal exam under anesthesia, anal dilation, pouchoscopy    . TOTAL ABDOMINAL HYSTERECTOMY  05/2006   for abscessed ovaries   family history includes Allergies in  her brother and father; Breast cancer in her paternal grandmother; Breast cancer (age of onset: 30) in her cousin; Cancer in her father; Cirrhosis in her mother; Diabetes in her mother; Hyperlipidemia in her father and mother; Hypertension in her father and mother; Liver cancer in her father; Ulcerative colitis in her father.  DATA OBTAINED & REVIEWED:   Recent Labs    01/08/17 0843 06/27/17 0843  HGBA1C 6.4 6.6*   No problems updated. . 12/05/2017: Three-view x-ray left shoulder: Negative for significant osteoarthritic change.  OBJECTIVE:  VS:  HT:5' 5"   (165.1 cm)   WT:245 lb 9.6 oz (111.4 kg)  BMI:40.87    BP:124/82  HR:78bpm  TEMP: ( )  RESP:97 %   PHYSICAL EXAM: CONSTITUTIONAL: Well-developed, Well-nourished and In no acute distress PSYCHIATRIC: Alert & appropriately interactive. and Not depressed or anxious appearing. RESPIRATORY: No increased work of breathing and Trachea Midline EYES: Pupils are equal., EOM intact without nystagmus. and No scleral icterus.  VASCULAR EXAM: Warm and well perfused NEURO: unremarkable  MSK Exam: Left shoulder  Well aligned, no significant deformity. No overlying skin changes. No focal bony tenderness   RANGE OF MOTION & STRENGTH  Marked limitations in overhead range of motion with limited internal and external rotation by 10 to 15 degrees compared to the right.  She does have most focal pain with overhead abduction and forward flexion with 30 to 40 degrees of limitation in these planes.   SPECIALITY TESTING:  Positive Hawkins and Neer's.  Pain with empty can testing, speeds and O'Brien's testing.       ASSESSMENT   1. Acute pain of left shoulder     PLAN:  Pertinent additional documentation may be included in corresponding procedure notes, imaging studies, problem based documentation and patient instructions.  Procedures:  . US Guided Injection per procedure note  Medications:  No orders of the defined types were placed in this encounter.  Discussion/Instructions: No problem-specific Assessment & Plan notes found for this encounter.  . Fairly classic impingement symptoms.  Subacromial injection performed today. .  . Discussed red flag symptoms that warrant earlier emergent evaluation and patient voices understanding. . Activity modifications and the importance of avoiding exacerbating activities (limiting pain to no more than a 4 / 10 during or following activity) recommended and discussed.  Follow-up:  . Return in about 2 weeks (around 12/19/2017).  . If any lack of  improvement: consider referral to Physical Therapy . At follow up will plan: To initiate home exercise protocol at that time but to symptom medic at this time.     CMA/ATC served as Education administrator during this visit. History, Physical, and Plan performed by medical provider. Documentation and orders reviewed and attested to.      Gerda Diss, [place] [facility] Physician

## 2017-12-05 NOTE — Patient Instructions (Addendum)

## 2017-12-19 ENCOUNTER — Ambulatory Visit: Payer: BC Managed Care – PPO | Admitting: Sports Medicine

## 2017-12-27 ENCOUNTER — Ambulatory Visit: Payer: BC Managed Care – PPO | Admitting: Sports Medicine

## 2017-12-27 ENCOUNTER — Encounter: Payer: Self-pay | Admitting: Sports Medicine

## 2017-12-27 VITALS — BP 136/86 | HR 74 | Ht 65.0 in | Wt 247.4 lb

## 2017-12-27 DIAGNOSIS — G2589 Other specified extrapyramidal and movement disorders: Secondary | ICD-10-CM

## 2017-12-27 DIAGNOSIS — M75102 Unspecified rotator cuff tear or rupture of left shoulder, not specified as traumatic: Secondary | ICD-10-CM

## 2017-12-27 DIAGNOSIS — M25512 Pain in left shoulder: Secondary | ICD-10-CM

## 2017-12-27 DIAGNOSIS — M7552 Bursitis of left shoulder: Secondary | ICD-10-CM

## 2017-12-27 MED ORDER — NITROGLYCERIN 0.2 MG/HR TD PT24: MEDICATED_PATCH | TRANSDERMAL | 1 refills | Status: DC

## 2017-12-27 NOTE — Progress Notes (Signed)
Jamie [name]. , [place] [facility] [place]  Jamie [name] - 62 y.o. female MRN [id number]  Date of birth: 1955-08-17  Visit Date:12/27/2017    PCP: Abner Greenspan, MD   Referred by: Tower, Wynelle Fanny, MD   SUBJECTIVE:  Chief Complaint  Patient presents with  . f/u L shoulder pain    Sx are improving but have not resolved. She continues to have aching in the lateral aspect of the shoulder. She responded well to steroid injection, more mobility, less pain. She still can't raise arm overhead comfortably. She continues taking Aleve BID. She is no longer using heat.     HPI: Patient reports the above symptoms.  REVIEW OF SYSTEMS: Denies night time disturbances. Denies fevers, chills, or night sweats. Denies unexplained weight loss. Denies personal history of cancer. Denies changes in bowel or bladder habits. Denies recent unreported falls. Denies new or worsening dyspnea or wheezing. Denies headaches or dizziness.  Denies numbness, tingling or weakness  In the extremities.  Denies dizziness or presyncopal episodes Denies lower extremity edema   HISTORY:  Prior history reviewed and updated per electronic medical record.  Social History   Occupational History  . Occupation: Guardian Ad [employer]  Tobacco Use  . Smoking status: Never Smoker  . Smokeless tobacco: Never Used  Substance and Sexual Activity  . Alcohol use: No    Alcohol/week: 0.0 standard drinks  . Drug use: No  . Sexual activity: Not on file   Social History   Social History Narrative  . Not on file     DATA OBTAINED & REVIEWED:  Recent Labs    06/27/17 0843 07/03/17 1012 01/30/18 0809  HGBA1C 6.6*  --  7.0*  CALCIUM  --   --  9.6  AST  --   --  29  ALT 19  --  22  TSH  --  2.08 3.14   No problems updated. No specialty comments available.  OBJECTIVE:  VS:  HT:5' 5"  (165.1 cm)   WT:247 lb 6.4 oz (112.2 kg)  BMI:41.17    BP:136/86   HR:74bpm  TEMP: ( )  RESP:98 %   PHYSICAL EXAM: CONSTITUTIONAL: Well-developed, Well-nourished and In no acute distress PSYCHIATRIC : Alert & appropriately interactive. and Not depressed or anxious appearing. RESPIRATORY : No increased work of breathing and Trachea Midline EYES : Pupils are equal., EOM intact without nystagmus. and No scleral icterus.  VASCULAR EXAM : Warm and well perfused NEURO: unremarkable  MSK Exam: Left shoulder is well aligned. She does have an anterior chain dominant position with scapular dyskinesis. Intrinsic rotator cuff strength is intact. Negative Hawkins and Neer's   ASSESSMENT   1. Acute pain of left shoulder   2. Subacromial bursitis of left shoulder joint   3. Scapular dyskinesis   4. Rotator cuff syndrome of left shoulder     PLAN:  Pertinent additional documentation may be included in corresponding procedure notes, imaging studies, problem based documentation and patient instructions.  Procedures:  Discussed the foundation of treatment for this condition is physical therapy and/or daily (5-6 days/week) therapeutic exercises, focusing on core strengthening, coordination, neuromuscular control/reeducation.  Therapeutic exercises prescribed per procedure note.  Medications:  Meds ordered this encounter  Medications  . nitroGLYCERIN (NITRODUR - DOSED IN MG/24 HR) 0.2 mg/hr patch    Sig: Place 1/4 to 1/2 of a patch over affected region. Remove and replace once daily.  Slightly alter skin placement daily  Dispense:  30 patch    Refill:  1    For musculoskeletal purposes.  Okay to cut patch.    Discussion/Instructions: No problem-specific Assessment & Plan notes found for this encounter.  Discussed red flag symptoms that warrant earlier emergent evaluation and patient voices understanding. Activity modifications and the importance of avoiding exacerbating activities (limiting pain to no more than a 4 / 10 during or following activity)  recommended and discussed. She is doing significantly better but continuing to have pain.  We will have her start on home therapeutic exercises per procedure note and have her also start on nitroglycerin protocol.  It is closer for her to follow-up with Dr. Sheila Oats at [facility] so I will defer further management to him but I am always happy to see her back at any time.  Recommend 6-week follow-up  If any lack of improvement: consider further diagnostic evaluation with MR arthrogram and consider referral to Physical Therapy   Return in about 6 weeks (around 02/07/2018) for With Dr. Edilia Bo.          Gerda Diss, [place] [facility] Physician

## 2017-12-27 NOTE — Progress Notes (Signed)

## 2017-12-27 NOTE — Patient Instructions (Addendum)
Nitroglycerin Protocol   Apply 1/4 nitroglycerin patch to affected area daily.  Change position of patch within the affected area every 24 hours.  You may experience a headache during the first 1-2 weeks of using the patch, these should subside.  If you experience headaches after beginning nitroglycerin patch treatment, you may take your preferred over the counter pain reliever.  Another side effect of the nitroglycerin patch is skin irritation or rash related to patch adhesive.  Please notify our office if you develop more severe headaches or rash, and stop the patch.  Tendon healing with nitroglycerin patch may require 12 to 24 weeks depending on the extent of injury.  Men should not use if taking Viagra, Cialis, or Levitra.   Do not use if you have migraines or rosacea.    Please perform the exercise program that we have prepared for you and gone over in detail on a daily basis.  In addition to the handout you were provided you can access your program through: [email or web address]   Your unique program code is:  [email or web address]

## 2017-12-28 NOTE — Progress Notes (Signed)
[facility] Consultation      Assessment and Plan:  Severe obstructive sleep apnea. -Patient was initially diagnosed with sleep apnea on a home titration test, and underwent an in lab bipap titration. She has tried an auto-CPAP titration per previous notes--did not tolerate CPAP at that time.  --On Bipap 19/14; AHI well-controlled, no residual sleepiness. - Patient waking up after 3 to 4 hours and moving to recliner, where she takes about an hour to fall back to sleep and at that point stays in a recliner for the night and does not use CPAP on those nights.  She is therefore encouraged to either avoid her recliner, or if she notices she is dozing off to move back to bed and put herself back on Pap.   Ulcerative colitis. -Patient has to empty her colostomy once per evening, this is time consuming, and interrupts with her sleep.    Jamie [name] is a 62 y.o. old female seen in consultation for chief complaint of:    Chief Complaint  Patient presents with  . Follow-up    1 yr f/u for sleep: feels she does ok with BiPAP:     HPI:  The patient is a 62 year old female with severe obstructive sleep apnea, maintained on BiPAP at 19/14.  Last visit it was noted that she was moving from the bed to the recliner and not using Pap there.  She was encouraged to try to maximize her time in bed.   She wakes up at night to go to the bathroom to empty her ileostomy, this is somewhat time-consuming. She then moves to her recliner where she does not use Pap.  Currently she notes that she is tolerating her BiPAP well.  However she continues to wake up to urinate, but then has trouble falling back to sleep.  She then goes to the recliner, she will eventually fall back to sleep after about an hour or so and then sleep for another 4 hours or so. She has [place] which she does not take regularly.  She is cleaning out her supplies about 3 times per week.    **CPAP download data  11/28/2017-12/28/2018>> raw data personally reviewed, she greater than 4 hours is 19/30 days (63%).  Average usage on days used for 33 minutes.  Pressure is 19/10.  Residual AHI is 5.9 leaks are minimal. **Review of 30 days of download data as of 12/21/16; usage is 30/30 days.  Average usage on days used is 5 hours 51 minutes.  Current settings are her curve 10 V auto; BiPAP 19/10 residual AHI is 3.2.  -home sleep study from the December 20th 2012:AHI 37.  No flowsheet data found.  Pulmonary Functions Testing Results:  No results found for: FEV1, FVC, FEV1FVC, TLC, DLCO Medication:       Allergies:  Lisinopril; Ciprofloxacin; and Sulfa antibiotics  Review of Systems:  Constitutional: Feels well. Cardiovascular: Denies chest pain, exertional chest pain.  Pulmonary: Denies hemoptysis, pleuritic chest pain.   The remainder of systems were reviewed and were found to be negative other than what is documented in the HPI.    Physical Examination:   VS: BP 126/68 (BP Location: Left Arm, Cuff Size: Large)   Pulse 89   Resp 16   Ht 5' 5"  (1.651 m)   Wt 247 lb (112 kg)   SpO2 96%   BMI 41.10 kg/m   General Appearance: No distress  Neuro:without focal findings, mental status, speech normal, alert and oriented  HEENT: PERRLA, EOM intact Pulmonary: No wheezing, No rales  CardiovascularNormal S1,S2.  No m/r/g.  Abdomen: Benign, Soft, non-tender, No masses Renal:  No costovertebral tenderness  GU:  No performed at this time. Endoc: No evident thyromegaly, no signs of acromegaly or Cushing features Skin:   warm, no rashes, no ecchymosis  Extremities: normal, no cyanosis, clubbing.      LABORATORY PANEL:   CBC No results for input(s): WBC, HGB, HCT, PLT in the last 168 hours. ------------------------------------------------------------------------------------------------------------------  Chemistries  No results for input(s): NA, K, CL, CO2, GLUCOSE, BUN, CREATININE, CALCIUM, MG,  AST, ALT, ALKPHOS, BILITOT in the last 168 hours.  Invalid input(s): GFRCGP ------------------------------------------------------------------------------------------------------------------  Cardiac Enzymes No results for input(s): TROPONINI in the last 168 hours. ------------------------------------------------------------  RADIOLOGY:  No results found.     Thank  you for the consultation and for allowing [facility] to assist in the care of your patient. Our recommendations are noted above.  Please contact us if we can be of further service.  Marda Stalker, M.D., F.C.C.P.  Board Certified in Internal Medicine, Pulmonary Medicine, [place], and Sleep Medicine.  [facility] Office Number: [phone]

## 2017-12-31 ENCOUNTER — Ambulatory Visit: Payer: BC Managed Care – PPO | Admitting: Internal Medicine

## 2017-12-31 VITALS — BP 126/68 | HR 89 | Resp 16 | Ht 65.0 in | Wt 247.0 lb

## 2017-12-31 DIAGNOSIS — G4733 Obstructive sleep apnea (adult) (pediatric): Secondary | ICD-10-CM

## 2017-12-31 NOTE — Patient Instructions (Signed)
Remember to go back to your bed to go back to sleep.  Alternatively you could take ambien to help you fall asleep faster (but make sure that you go back to bed instead of sleeping in your recliner).

## 2018-01-28 ENCOUNTER — Other Ambulatory Visit: Payer: BC Managed Care – PPO

## 2018-01-29 ENCOUNTER — Telehealth: Payer: Self-pay | Admitting: Family Medicine

## 2018-01-29 ENCOUNTER — Encounter: Payer: BC Managed Care – PPO | Admitting: Family Medicine

## 2018-01-29 DIAGNOSIS — E119 Type 2 diabetes mellitus without complications: Secondary | ICD-10-CM

## 2018-01-29 DIAGNOSIS — Z0000 Encounter for general adult medical examination without abnormal findings: Secondary | ICD-10-CM

## 2018-01-29 DIAGNOSIS — I10 Essential (primary) hypertension: Secondary | ICD-10-CM

## 2018-01-29 DIAGNOSIS — E781 Pure hyperglyceridemia: Secondary | ICD-10-CM

## 2018-01-29 NOTE — Telephone Encounter (Signed)
-----   Message from Ellamae Sia sent at 01/21/2018 10:59 AM EST ----- Regarding: Lab orders for Wednesday, 1.8.20 Patient is scheduled for CPX labs, please order future labs, Thanks , Karna Christmas

## 2018-01-30 ENCOUNTER — Other Ambulatory Visit (INDEPENDENT_AMBULATORY_CARE_PROVIDER_SITE_OTHER): Payer: BC Managed Care – PPO

## 2018-01-30 ENCOUNTER — Other Ambulatory Visit: Payer: Self-pay | Admitting: *Deleted

## 2018-01-30 DIAGNOSIS — E119 Type 2 diabetes mellitus without complications: Secondary | ICD-10-CM | POA: Diagnosis not present

## 2018-01-30 DIAGNOSIS — E781 Pure hyperglyceridemia: Secondary | ICD-10-CM | POA: Diagnosis not present

## 2018-01-30 DIAGNOSIS — I10 Essential (primary) hypertension: Secondary | ICD-10-CM

## 2018-01-30 LAB — LDL CHOLESTEROL, DIRECT: Direct LDL: 62 mg/dL

## 2018-01-30 LAB — COMPREHENSIVE METABOLIC PANEL
ALT: 22 U/L (ref 0–35)
AST: 29 U/L (ref 0–37)
Albumin: 3.9 g/dL (ref 3.5–5.2)
Alkaline Phosphatase: 60 U/L (ref 39–117)
BUN: 15 mg/dL (ref 6–23)
CO2: 23 mEq/L (ref 19–32)
Calcium: 9.6 mg/dL (ref 8.4–10.5)
Chloride: 101 mEq/L (ref 96–112)
Creatinine, Ser: 1.06 mg/dL (ref 0.40–1.20)
GFR: 55.53 mL/min — AB (ref >60.00)
GLUCOSE: 146 mg/dL — AB (ref 70–99)
Potassium: 4.5 mEq/L (ref 3.5–5.1)
Sodium: 134 mEq/L — ABNORMAL LOW (ref 135–145)
Total Bilirubin: 0.7 mg/dL (ref 0.2–1.2)
Total Protein: 7 g/dL (ref 6.0–8.3)

## 2018-01-30 LAB — HEMOGLOBIN A1C: HEMOGLOBIN A1C: 7 % — AB (ref 4.6–6.5)

## 2018-01-30 LAB — TSH: TSH: 3.14 u[IU]/mL (ref 0.35–4.50)

## 2018-01-30 LAB — CBC WITH DIFFERENTIAL/PLATELET
BASOS ABS: 0 10*3/uL (ref 0.0–0.1)
Basophils Relative: 0.6 % (ref 0.0–3.0)
Eosinophils Absolute: 0.5 10*3/uL (ref 0.0–0.7)
Eosinophils Relative: 5.9 % — ABNORMAL HIGH (ref 0.0–5.0)
HCT: 42.3 % (ref 36.0–46.0)
Hemoglobin: 14.3 g/dL (ref 12.0–15.0)
Lymphocytes Relative: 18.7 % (ref 12.0–46.0)
Lymphs Abs: 1.5 10*3/uL (ref 0.7–4.0)
MCHC: 33.7 g/dL (ref 30.0–36.0)
MCV: 95.1 fl (ref 78.0–100.0)
MONOS PCT: 7 % (ref 3.0–12.0)
Monocytes Absolute: 0.6 10*3/uL (ref 0.1–1.0)
NEUTROS ABS: 5.4 10*3/uL (ref 1.4–7.7)
Neutrophils Relative %: 67.8 % (ref 43.0–77.0)
Platelets: 250 10*3/uL (ref 150.0–400.0)
RBC: 4.45 Mil/uL (ref 3.87–5.11)
RDW: 13.6 % (ref 11.5–15.5)
WBC: 7.9 10*3/uL (ref 4.0–10.5)

## 2018-01-30 LAB — MICROALBUMIN / CREATININE URINE RATIO
Creatinine,U: 369.7 mg/dL
Microalb Creat Ratio: 0.6 mg/g (ref 0.0–30.0)
Microalb, Ur: 2.1 mg/dL — ABNORMAL HIGH (ref 0.0–1.9)

## 2018-01-30 LAB — LIPID PANEL
Cholesterol: 132 mg/dL (ref 0–200)
HDL: 50.7 mg/dL (ref >39.00)
NONHDL: 81.26
Total CHOL/HDL Ratio: 3
Triglycerides: 202 mg/dL — ABNORMAL HIGH (ref 0.0–149.0)
VLDL: 40.4 mg/dL — ABNORMAL HIGH (ref 0.0–40.0)

## 2018-01-30 MED ORDER — ATORVASTATIN CALCIUM 10 MG PO TABS: ORAL_TABLET | ORAL | 5 refills | Status: DC

## 2018-01-31 ENCOUNTER — Ambulatory Visit (INDEPENDENT_AMBULATORY_CARE_PROVIDER_SITE_OTHER): Payer: BC Managed Care – PPO | Admitting: Family Medicine

## 2018-01-31 ENCOUNTER — Encounter: Payer: Self-pay | Admitting: Family Medicine

## 2018-01-31 VITALS — BP 110/62 | HR 85 | Temp 98.1°F | Ht 65.5 in | Wt 247.8 lb

## 2018-01-31 DIAGNOSIS — I10 Essential (primary) hypertension: Secondary | ICD-10-CM | POA: Diagnosis not present

## 2018-01-31 DIAGNOSIS — E785 Hyperlipidemia, unspecified: Secondary | ICD-10-CM

## 2018-01-31 DIAGNOSIS — Z0000 Encounter for general adult medical examination without abnormal findings: Secondary | ICD-10-CM

## 2018-01-31 DIAGNOSIS — L719 Rosacea, unspecified: Secondary | ICD-10-CM

## 2018-01-31 DIAGNOSIS — E1169 Type 2 diabetes mellitus with other specified complication: Secondary | ICD-10-CM

## 2018-01-31 DIAGNOSIS — E119 Type 2 diabetes mellitus without complications: Secondary | ICD-10-CM

## 2018-01-31 DIAGNOSIS — K51019 Ulcerative (chronic) pancolitis with unspecified complications: Secondary | ICD-10-CM

## 2018-01-31 DIAGNOSIS — E669 Obesity, unspecified: Secondary | ICD-10-CM

## 2018-01-31 MED ORDER — ZOLPIDEM TARTRATE 10 MG PO TABS: 5.0000 mg | ORAL_TABLET | Freq: Every evening | ORAL | 0 refills | Status: DC | PRN

## 2018-01-31 MED ORDER — METFORMIN HCL 500 MG PO TABS: 500.0000 mg | ORAL_TABLET | Freq: Two times a day (BID) | ORAL | 3 refills | Status: DC

## 2018-01-31 MED ORDER — METRONIDAZOLE 0.75 % EX LOTN: TOPICAL_LOTION | CUTANEOUS | 1 refills | Status: DC

## 2018-01-31 MED ORDER — METOPROLOL TARTRATE 25 MG PO TABS: ORAL_TABLET | ORAL | 3 refills | Status: DC

## 2018-01-31 MED ORDER — HYOSCYAMINE SULFATE 0.125 MG SL SUBL: 0.1250 mg | SUBLINGUAL_TABLET | SUBLINGUAL | 3 refills | Status: DC | PRN

## 2018-01-31 MED ORDER — ATORVASTATIN CALCIUM 10 MG PO TABS: ORAL_TABLET | ORAL | 5 refills | Status: DC

## 2018-01-31 NOTE — Assessment & Plan Note (Signed)
Lab Results  Component Value Date   HGBA1C 7.0 (H) 01/30/2018   Worse  Bad eating over holidays Plans to return to Dm diet and exercise Good eye and foot care microalb up -cannot take ace  F/u 3 mo

## 2018-01-31 NOTE — Assessment & Plan Note (Signed)
More issues with papules and redness Trial of metro lotion and update

## 2018-01-31 NOTE — Progress Notes (Signed)
Subjective:    Patient ID: Jamie [name], female    DOB: 11/06/1955, 63 y.o.   MRN: [id number]  HPI Here for health maintenance exam and to review chronic medical problems   At the start of a cold - since yesterday Cough and congestion   Has been doing ok overall   Wt Readings from Last 3 Encounters:  01/31/18 247 lb 12 oz (112.4 kg)  12/31/17 247 lb (112 kg)  12/27/17 247 lb 6.4 oz (112.2 kg)  wt is up  Not doing well with diet and exercise - s/p holidays and vacation  Walking for exercise -not enough  40.60 kg/m   Mammogram 1/19 - due for /she will set it up  Self breast exam -no lumps   Eye exam 10/19- vision is getting worse  Needs cataract surgery -plans to get surgery planned    Tetanus shot 9/14 PNA vaccine 11/15 Flu shot 9/19  zostavax 12/16  Colitis-s/p colectomy  No changes clinically  Always has frustrations over her bag   Harder time staying asleep   Using nitro patch on arm for tendonitis-arm is better   bp is stable today  No cp or palpitations or headaches or edema  No side effects to medicines  BP Readings from Last 3 Encounters:  01/31/18 110/62  12/31/17 126/68  12/27/17 136/86     Lab Results  Component Value Date   CREATININE 1.06 01/30/2018   BUN 15 01/30/2018   NA 134 (L) 01/30/2018   K 4.5 01/30/2018   CL 101 01/30/2018   CO2 23 01/30/2018   Lab Results  Component Value Date   ALT 22 01/30/2018   AST 29 01/30/2018   ALKPHOS 60 01/30/2018   BILITOT 0.7 01/30/2018   Lab Results  Component Value Date   WBC 7.9 01/30/2018   HGB 14.3 01/30/2018   HCT 42.3 01/30/2018   MCV 95.1 01/30/2018   PLT 250.0 01/30/2018      DM2 Lab Results  Component Value Date   HGBA1C 7.0 (H) 01/30/2018  up from 6.6 On metformin  Good eye and foot care Needs to limit sugar and simple carbs (xmas was bad)  Lab Results  Component Value Date   MICROALBUR 2.1 (H) 01/30/2018     Cholesterol Lab Results  Component Value Date   CHOL  132 01/30/2018   CHOL 133 06/27/2017   CHOL 137 01/08/2017   Lab Results  Component Value Date   HDL 50.70 01/30/2018   HDL 49.80 06/27/2017   HDL 43.50 01/08/2017   Lab Results  Component Value Date   LDLCALC 49 06/27/2017   LDLCALC 76 01/05/2015   LDLCALC 65 09/25/2012   Lab Results  Component Value Date   TRIG 202.0 (H) 01/30/2018   TRIG 169.0 (H) 06/27/2017   TRIG 201.0 (H) 01/08/2017   Lab Results  Component Value Date   CHOLHDL 3 01/30/2018   CHOLHDL 3 06/27/2017   CHOLHDL 3 01/08/2017   Lab Results  Component Value Date   LDLDIRECT 62.0 01/30/2018   LDLDIRECT 83.0 01/08/2017   LDLDIRECT 79.0 07/21/2016  atorvastatin - LDL is 49   Lab Results  Component Value Date   TSH 3.14 01/30/2018     Patient Active Problem List   Diagnosis Date Noted  . Otalgia, right ear 11/30/2017  . Left shoulder pain 11/30/2017  . Chest tightness 07/05/2017  . Palpitations 07/05/2017  . Chest discomfort 07/03/2017  . Shortness of breath 07/03/2017  . Ostomy nurse consultation  06/05/2017  . Lichen planus 27/03/5007  . Hypertriglyceridemia 04/10/2016  . Toe contusion 07/16/2015  . Obesity (BMI 30-39.9) 01/08/2015  . Red blood cell antibody positive 07/02/2013  . Elevated C-reactive protein (CRP) 05/15/2012  . Stricture of anal canal 11/21/2011  . Rectal discharge 10/02/2011  . Routine general medical examination at a health care facility 09/24/2011  . Apnea 11/14/2010  . Neck pain 11/14/2010  . Nevus of lower leg 11/14/2010  . Other screening mammogram 10/31/2010  . Stress reaction, emotional 05/02/2010  . Compulsive overeater 05/02/2010  . Arthritis   . H/O small bowel obstruction   . KNEE PAIN 10/14/2009  . HOARSENESS 07/21/2009  . Diabetes type 2, controlled ([place]) 04/23/2009  . ROSACEA 09/12/2007  . Essential hypertension 05/15/2007  . OSA (obstructive sleep apnea) 12/13/2006  . Ulcerative colitis ([place]) 07/12/2006   Past Medical History:  Diagnosis Date  .  Arthritis    generalized  . Bowel obstruction ([facility])    repeated (? possible adhesions) neg EGD  . Colitis    with colectomy  . Gall stones 2008  . Gastroenteritis 07/25/14   hosp for IVF   . HTN (hypertension)   . Hyperglycemia    mild-monitors A1C  . Obesity   . Rosacea   . Sleep apnea    CPAP everynight   Past Surgical History:  Procedure Laterality Date  . CHOLECYSTECTOMY    . COLECTOMY  1993   has ileostomy  . COLOSTOMY REVISION  12/06/2011   Procedure: COLOSTOMY REVISION;  Surgeon: Leighton Ruff, MD;  [facility]: [facility];  Service: General;  Laterality: Right;  OSTOMY revision  . EXAMINATION UNDER ANESTHESIA  12/06/2011   Procedure: EXAM UNDER ANESTHESIA;  Surgeon: Leighton Ruff, MD;  Location[facility];  Service: General;  Laterality: N/A;  rectal exam under anesthesia, anal dilation, pouchoscopy    . TOTAL ABDOMINAL HYSTERECTOMY  05/2006   for abscessed ovaries   Social History   Tobacco Use  . Smoking status: Never Smoker  . Smokeless tobacco: Never Used  Substance Use Topics  . Alcohol use: No    Alcohol/week: 0.0 standard drinks  . Drug use: No   Family History  Problem Relation Age of Onset  . Cancer Father        colon  . Liver cancer Father        resection secondary to mets  . Hyperlipidemia Father   . Hypertension Father   . Allergies Father   . Ulcerative colitis Father   . Hypertension Mother   . Hyperlipidemia Mother   . Cirrhosis Mother        NASH  . Diabetes Mother   . Allergies Brother   . Breast cancer Paternal Grandmother   . Breast cancer Cousin 52       paternal cousins   Allergies  Allergen Reactions  . Lisinopril     REACTION: felt bad/ stomach hurt/ weak and tired  . Ciprofloxacin Rash    Erythema and pruritis around IV site, erythema and rash along vein  . Sulfa Antibiotics Rash   Current Outpatient Medications on File Prior to Visit  Medication Sig Dispense Refill  . Acetaminophen  (TYLENOL) 325 MG CAPS Take 2 capsules by mouth daily as needed.    . Blood Glucose Monitoring Suppl (GLUCOMETER ELITE CLASSIC) KIT by Does not apply route daily. Check sugar once daily and as needed for DM2 250.0     . CINNAMON PO Take 1,000 mg by mouth  daily.    . Esomeprazole Magnesium (NEXIUM PO) Take 1 tablet by mouth every other day.    [place] Kitchen glucose blood test strip 1 each by Other route daily. Reported on 07/16/2015    . Multiple Vitamin (MULTIVITAMIN) tablet Take 1 tablet by mouth daily.      . naproxen sodium (ANAPROX) 220 MG tablet Take 220 mg by mouth 2 (two) times daily as needed.     . nitroGLYCERIN (NITRODUR - DOSED IN MG/24 HR) 0.2 mg/hr patch Place 1/4 to 1/2 of a patch over affected region. Remove and replace once daily.  Slightly alter skin placement daily 30 patch 1  . NON FORMULARY SLEEPS WITH BI-PAP    . ondansetron (ZOFRAN ODT) 4 MG disintegrating tablet Take 1 tablet (4 mg total) by mouth every 4 (four) hours as needed for nausea or vomiting. 20 tablet 0   No current facility-administered medications on file prior to visit.     Review of Systems  Constitutional: Negative for activity change, appetite change, fatigue, fever and unexpected weight change.  HENT: Positive for postnasal drip and rhinorrhea. Negative for congestion, ear pain, sinus pressure and sore throat.        Getting a cold  Eyes: Positive for visual disturbance. Negative for pain and redness.       Cataracts  Respiratory: Negative for cough, shortness of breath and wheezing.        Generally poor stamina   Cardiovascular: Negative for chest pain, palpitations and leg swelling.  Gastrointestinal: Negative for abdominal pain, blood in stool, constipation and diarrhea.  Endocrine: Negative for polydipsia and polyuria.  Genitourinary: Negative for dysuria, frequency and urgency.  Musculoskeletal: Positive for arthralgias. Negative for back pain and myalgias.  Skin: Negative for pallor and rash.    Allergic/Immunologic: Negative for environmental allergies and immunocompromised state.  Neurological: Negative for dizziness, syncope and headaches.  Hematological: Negative for adenopathy. Does not bruise/bleed easily.  Psychiatric/Behavioral: Negative for decreased concentration and dysphoric mood. The patient is not nervous/anxious.        More emotional lately -tears up easily        Objective:   Physical Exam Constitutional:      General: She is not in acute distress.    Appearance: She is well-developed. She is obese. She is not ill-appearing.  HENT:     Head: Normocephalic and atraumatic.     Right Ear: Tympanic membrane, ear canal and external ear normal.     Left Ear: Tympanic membrane, ear canal and external ear normal.     Nose: Rhinorrhea present.     Mouth/Throat:     Mouth: Mucous membranes are moist.     Pharynx: Oropharynx is clear.  Eyes:     General: No scleral icterus.    Conjunctiva/sclera: Conjunctivae normal.     Pupils: Pupils are equal, round, and reactive to light.  Neck:     Musculoskeletal: Normal range of motion and neck supple.     Thyroid: No thyromegaly.     Vascular: No carotid bruit or JVD.  Cardiovascular:     Rate and Rhythm: Normal rate and regular rhythm.     Heart sounds: Normal heart sounds. No gallop.   Pulmonary:     Effort: Pulmonary effort is normal. No respiratory distress.     Breath sounds: Normal breath sounds. No stridor. No wheezing or rhonchi.     Comments: Good air exch  Chest:     Chest wall: No tenderness.  Abdominal:  General: Bowel sounds are normal. There is no distension or abdominal bruit.     Palpations: Abdomen is soft. There is no mass.     Tenderness: There is no abdominal tenderness.  Musculoskeletal: Normal range of motion.        General: No tenderness.     Right lower leg: No edema.     Left lower leg: No edema.  Lymphadenopathy:     Cervical: No cervical adenopathy.  Skin:    General: Skin is  warm and dry.     Capillary Refill: Capillary refill takes less than 2 seconds.     Coloration: Skin is not pale.     Findings: No erythema or rash.  Neurological:     Mental Status: She is alert. Mental status is at baseline.     Cranial Nerves: No cranial nerve deficit.     Motor: No abnormal muscle tone.     Coordination: Coordination normal.     Deep Tendon Reflexes: Reflexes are normal and symmetric. Reflexes normal.  Psychiatric:        Mood and Affect: Mood normal.     Comments: Pleasant  occ tearful when discussing her cataracts and need for surgery           Assessment & Plan:   Problem List Items Addressed This Visit      Cardiovascular and Mediastinum   Essential hypertension    bp in fair control at this time  BP Readings from Last 1 Encounters:  01/31/18 110/62   No changes needed Most recent labs reviewed  Disc lifstyle change with low sodium diet and exercise        Relevant Medications   metoprolol tartrate (LOPRESSOR) 25 MG tablet   atorvastatin (LIPITOR) 10 MG tablet     Digestive   Ulcerative colitis ([place])    Pt still struggles with skin problems at ostomy site  Otherwise stable         Endocrine   Hyperlipidemia associated with type 2 diabetes mellitus ([place])    Disc goals for lipids and reasons to control them Rev last labs with pt Rev low sat fat diet in detail Trig are up with worse DM LDL well controlled with statin       Relevant Medications   metFORMIN (GLUCOPHAGE) 500 MG tablet   atorvastatin (LIPITOR) 10 MG tablet   Diabetes type 2, controlled ([place])    Lab Results  Component Value Date   HGBA1C 7.0 (H) 01/30/2018   Worse  Bad eating over holidays Plans to return to Dm diet and exercise Good eye and foot care microalb up -cannot take ace  F/u 3 mo      Relevant Medications   metFORMIN (GLUCOPHAGE) 500 MG tablet   atorvastatin (LIPITOR) 10 MG tablet     Musculoskeletal and Integument   ROSACEA    More issues with  papules and redness Trial of metro lotion and update         Other   Routine general medical examination at a health care facility - Primary    Reviewed health habits including diet and exercise and skin cancer prevention Reviewed appropriate screening tests for age  Also reviewed health mt list, fam hx and immunization status , as well as social and family history   Pt plans to schedule her own mammogram due this mo  Stressed imp of diet and exercise        RESOLVED: Obesity (BMI 30-39.9)   Relevant Medications  metFORMIN (GLUCOPHAGE) 500 MG tablet   Morbid obesity ([place])    Discussed how this problem influences overall health and the risks it imposes  Reviewed plan for weight loss with lower calorie diet (via better food choices and also portion control or program like weight watchers) and exercise building up to or more than 30 minutes 5 days per week including some aerobic activity   Pt suffers from compulsive eating patterns      Relevant Medications   metFORMIN (GLUCOPHAGE) 500 MG tablet

## 2018-01-31 NOTE — Assessment & Plan Note (Signed)
bp in fair control at this time  BP Readings from Last 1 Encounters:  01/31/18 110/62   No changes needed Most recent labs reviewed  Disc lifstyle change with low sodium diet and exercise

## 2018-01-31 NOTE — Assessment & Plan Note (Signed)
Reviewed health habits including diet and exercise and skin cancer prevention Reviewed appropriate screening tests for age  Also reviewed health mt list, fam hx and immunization status , as well as social and family history   Pt plans to schedule her own mammogram due this mo  Stressed imp of diet and exercise

## 2018-01-31 NOTE — Assessment & Plan Note (Signed)
Disc goals for lipids and reasons to control them Rev last labs with pt Rev low sat fat diet in detail Trig are up with worse DM LDL well controlled with statin

## 2018-01-31 NOTE — Assessment & Plan Note (Signed)
Pt still struggles with skin problems at ostomy site  Otherwise stable

## 2018-01-31 NOTE — Assessment & Plan Note (Signed)
Discussed how this problem influences overall health and the risks it imposes  Reviewed plan for weight loss with lower calorie diet (via better food choices and also portion control or program like weight watchers) and exercise building up to or more than 30 minutes 5 days per week including some aerobic activity   Pt suffers from compulsive eating patterns

## 2018-01-31 NOTE — Patient Instructions (Addendum)
Don't forget to schedule your mammogram at [facility] back on track with diet and exercise   Try the metro lotion for rosacea   Follow up 3 months

## 2018-02-06 ENCOUNTER — Other Ambulatory Visit: Payer: Self-pay | Admitting: Family Medicine

## 2018-02-06 DIAGNOSIS — Z1231 Encounter for screening mammogram for malignant neoplasm of breast: Secondary | ICD-10-CM

## 2018-02-11 ENCOUNTER — Ambulatory Visit: Payer: BC Managed Care – PPO | Admitting: Family Medicine

## 2018-02-25 ENCOUNTER — Ambulatory Visit
Admission: RE | Admit: 2018-02-25 | Discharge: 2018-02-25 | Disposition: A | Discharge status: Home / Self Care | Payer: BC Managed Care – PPO | Source: Ambulatory Visit | Attending: Family Medicine | Admitting: Family Medicine

## 2018-02-25 DIAGNOSIS — Z1231 Encounter for screening mammogram for malignant neoplasm of breast: Secondary | ICD-10-CM | POA: Insufficient documentation

## 2018-02-28 ENCOUNTER — Encounter: Payer: Self-pay | Admitting: Sports Medicine

## 2018-03-04 ENCOUNTER — Telehealth: Payer: Self-pay | Admitting: Family Medicine

## 2018-03-04 NOTE — Telephone Encounter (Signed)
Left message asking pt to call office please r/s 4/10 appointment with dr tower

## 2018-03-08 ENCOUNTER — Other Ambulatory Visit: Payer: Self-pay | Admitting: Family Medicine

## 2018-05-03 ENCOUNTER — Ambulatory Visit: Payer: BC Managed Care – PPO | Admitting: Family Medicine

## 2018-05-06 ENCOUNTER — Ambulatory Visit (INDEPENDENT_AMBULATORY_CARE_PROVIDER_SITE_OTHER): Payer: BC Managed Care – PPO | Admitting: Family Medicine

## 2018-05-06 ENCOUNTER — Encounter: Payer: Self-pay | Admitting: Family Medicine

## 2018-05-06 VITALS — Wt 240.0 lb

## 2018-05-06 DIAGNOSIS — E119 Type 2 diabetes mellitus without complications: Secondary | ICD-10-CM

## 2018-05-06 DIAGNOSIS — E785 Hyperlipidemia, unspecified: Secondary | ICD-10-CM

## 2018-05-06 DIAGNOSIS — I10 Essential (primary) hypertension: Secondary | ICD-10-CM

## 2018-05-06 DIAGNOSIS — E1169 Type 2 diabetes mellitus with other specified complication: Secondary | ICD-10-CM | POA: Diagnosis not present

## 2018-05-06 NOTE — Assessment & Plan Note (Signed)
Pt is not checking bp at home but no symptoms of high or low bp Has been well controlled with metoprolol

## 2018-05-06 NOTE — Assessment & Plan Note (Signed)
Per pt- not eating as well for meals (at home) but she is not snacking and has lost 7 lb from her last weight here  Not as active-enc her to use her elliptical machine and get out and walk  Rev microalb (cannot take ace)  Foot/eye care utd  Will order POCT A1c to come by and [facility]  Continues metformin Enc further weight loss

## 2018-05-06 NOTE — Assessment & Plan Note (Signed)
Disc goals for lipids and reasons to control them Rev last labs with pt Rev low sat fat diet in detail Taking her atorvastatin-has been well controlled

## 2018-05-06 NOTE — Assessment & Plan Note (Signed)
Per pt 7 lb lost w/o snacking Enc more activity  Also better meals/ DM diet  Discussed how this problem influences overall health and the risks it imposes  Reviewed plan for weight loss with lower calorie diet (via better food choices and also portion control or program like weight watchers) and exercise building up to or more than 30 minutes 5 days per week including some aerobic activity

## 2018-05-06 NOTE — Progress Notes (Signed)
Virtual Visit via Video Note  I connected with Jamie [name] on 05/06/18 at  9:00 AM EDT by a video enabled telemedicine application and verified that I am speaking with the correct person using two identifiers. The patient is at her house today  I am in my office    I discussed the limitations of evaluation and management by telemedicine and the availability of in person appointments. The patient expressed understanding and agreed to proceed.  History of Present Illness: Here for f/u of chronic health problems  Staying home during the  Weight / h/o morbid obesity  Trying to take care of herself  She weighs herself , last was 240 (down 7 lb)  No trouble fining things to do  Outside and inside projects  She has an elliptical machine in the house-needs to use it more  Getting used to having her husband at home    bp is stable today  No cp or palpitations or headaches or edema  No side effects to medicines  BP Readings from Last 3 Encounters:  01/31/18 110/62  12/31/17 126/68  12/27/17 136/86    Metoprolol Not checking at home- but no symptoms   DM2 Last visit a1c was up to 7.0 Lab Results  Component Value Date   HGBA1C 7.0 (H) 01/30/2018   Needs A1C done Cannot take ace Lab Results  Component Value Date   MICROALBUR 2.1 (H) 01/30/2018  taking metformin - tolerating well  Occasionally misses a dose in the evening  Has not changed her diet significantly  Has struggled with food limitations from the store  Is avoiding snacking  Eye exam 10/19  utd foot exam  She checks her glucose first thing in am   (120s to 150s in general) -no big ups or downs     hyperlipidemia  Lab Results  Component Value Date   CHOL 132 01/30/2018   HDL 50.70 01/30/2018   LDLCALC 49 06/27/2017   LDLDIRECT 62.0 01/30/2018   TRIG 202.0 (H) 01/30/2018   CHOLHDL 3 01/30/2018  taking atorvastatin  LDL is well controlled Trig may be high due to DM2  Patient Active Problem List   Diagnosis Date Noted  . Left shoulder pain 11/30/2017  . Palpitations 07/05/2017  . Ostomy nurse consultation 06/05/2017  . Lichen planus [id number]/[id number]  . Hyperlipidemia associated with type 2 diabetes mellitus ([place]) 04/10/2016  . Red blood cell antibody positive 07/02/2013  . Elevated C-reactive protein (CRP) 05/15/2012  . Stricture of anal canal 11/21/2011  . Rectal discharge 10/02/2011  . Routine general medical examination at a health care facility 09/24/2011  . Apnea 11/14/2010  . Nevus of lower leg 11/14/2010  . Other screening mammogram 10/31/2010  . Stress reaction, emotional 05/02/2010  . Compulsive overeater 05/02/2010  . Arthritis   . Morbid obesity ([place])   . H/O small bowel obstruction   . KNEE PAIN 10/14/2009  . HOARSENESS 07/21/2009  . Diabetes type 2, controlled ([place]) 04/23/2009  . ROSACEA 09/12/2007  . Essential hypertension 05/15/2007  . OSA (obstructive sleep apnea) 12/13/2006  . Ulcerative colitis ([place]) 07/12/2006   Past Medical History:  Diagnosis Date  . Arthritis    generalized  . Bowel obstruction ([facility])    repeated (? possible adhesions) neg EGD  . Colitis    with colectomy  . Gall stones 2008  . Gastroenteritis 07/25/14   hosp for IVF   . HTN (hypertension)   . Hyperglycemia    mild-monitors A1C  . Obesity   .  Rosacea   . Sleep apnea    CPAP everynight   Past Surgical History:  Procedure Laterality Date  . CHOLECYSTECTOMY    . COLECTOMY  1993   has ileostomy  . COLOSTOMY REVISION  12/06/2011   Procedure: COLOSTOMY REVISION;  Surgeon: Leighton Ruff, MD;  [facility]: [facility];  Service: General;  Laterality: Right;  OSTOMY revision  . EXAMINATION UNDER ANESTHESIA  12/06/2011   Procedure: EXAM UNDER ANESTHESIA;  Surgeon: Leighton Ruff, MD;  Location: [facility];  Service: General;  Laterality: N/A;  rectal exam under anesthesia, anal dilation, pouchoscopy    . TOTAL ABDOMINAL HYSTERECTOMY  05/2006   for abscessed  ovaries   Social History   Tobacco Use  . Smoking status: Never Smoker  . Smokeless tobacco: Never Used  Substance Use Topics  . Alcohol use: No    Alcohol/week: 0.0 standard drinks  . Drug use: No   Family History  Problem Relation Age of Onset  . Cancer Father        colon  . Liver cancer Father        resection secondary to mets  . Hyperlipidemia Father   . Hypertension Father   . Allergies Father   . Ulcerative colitis Father   . Hypertension Mother   . Hyperlipidemia Mother   . Cirrhosis Mother        NASH  . Diabetes Mother   . Allergies Brother   . Breast cancer Paternal Grandmother   . Breast cancer Cousin 67       paternal cousins   Allergies  Allergen Reactions  . Lisinopril     REACTION: felt bad/ stomach hurt/ weak and tired  . Ciprofloxacin Rash    Erythema and pruritis around IV site, erythema and rash along vein  . Sulfa Antibiotics Rash   Current Outpatient Medications on File Prior to Visit  Medication Sig Dispense Refill  . Acetaminophen (TYLENOL) 325 MG CAPS Take 2 capsules by mouth daily as needed.    Marland Kitchen atorvastatin (LIPITOR) 10 MG tablet Take 1/2 pill by mouth every other day 16 tablet 5  . Blood Glucose Monitoring Suppl (GLUCOMETER ELITE CLASSIC) KIT by Does not apply route daily. Check sugar once daily and as needed for DM2 250.0     . CINNAMON PO Take 1,000 mg by mouth daily.    . Esomeprazole Magnesium (NEXIUM PO) Take 1 tablet by mouth every other day.    Marland Kitchen glucose blood test strip 1 each by Other route daily. Reported on 07/16/2015    . hyoscyamine (LEVSIN SL) 0.125 MG SL tablet Take 1 tablet (0.125 mg total) by mouth every 4 (four) hours as needed. For abdominal cramps 90 tablet 3  . metFORMIN (GLUCOPHAGE) 500 MG tablet Take 1 tablet (500 mg total) by mouth 2 (two) times daily with a meal. 180 tablet 3  . metoprolol tartrate (LOPRESSOR) 25 MG tablet TAKE 1 TABLET BY MOUTH EVERY DAY 90 tablet 0  . METRONIDAZOLE, TOPICAL, 0.75 % LOTN Apply  topically twice daily to rosacea areas 59 mL 1  . Multiple Vitamin (MULTIVITAMIN) tablet Take 1 tablet by mouth daily.      . naproxen sodium (ANAPROX) 220 MG tablet Take 220 mg by mouth 2 (two) times daily as needed.     . nitroGLYCERIN (NITRODUR - DOSED IN MG/24 HR) 0.2 mg/hr patch Place 1/4 to 1/2 of a patch over affected region. Remove and replace once daily.  Slightly alter skin placement daily  30 patch 1  . NON FORMULARY SLEEPS WITH BI-PAP    . ondansetron (ZOFRAN ODT) 4 MG disintegrating tablet Take 1 tablet (4 mg total) by mouth every 4 (four) hours as needed for nausea or vomiting. 20 tablet 0  . zolpidem (AMBIEN) 10 MG tablet Take 0.5-1 tablets (5-10 mg total) by mouth at bedtime as needed for sleep. TAKE 1 TABLET BY MOUTH AT BEDTIME AS NEEDED 30 tablet 0   No current facility-administered medications on file prior to visit.      Review of Systems  Constitutional: Negative for chills, fever, malaise/fatigue and weight loss.  Eyes: Negative for blurred vision.  Respiratory: Negative for cough and shortness of breath.   Cardiovascular: Negative for chest pain and palpitations.  Gastrointestinal: Negative for nausea.  Musculoskeletal: Negative for myalgias.  Skin: Negative for rash.  Neurological: Negative for dizziness and headaches.  Psychiatric/Behavioral: Negative for depression. The patient is not nervous/anxious.        Mood is positive Enjoying some home time and projects        Observations/Objective: Pt appears well - her baseline Obese  No cough or hoarse voice No facial or eye swelling  No rash or skin change  Good mood- cheerful and talkative  Answers questions appropriately  Mentally sharp   Assessment and Plan: Problem List Items Addressed This Visit      Cardiovascular and Mediastinum   Essential hypertension    Pt is not checking bp at home but no symptoms of high or low bp Has been well controlled with metoprolol          Endocrine   Diabetes  type 2, controlled ([place]) - Primary    Per pt- not eating as well for meals (at home) but she is not snacking and has lost 7 lb from her last weight here  Not as active-enc her to use her elliptical machine and get out and walk  Rev microalb (cannot take ace)  Foot/eye care utd  Will order POCT A1c to come by and check at the outdoor drive through lab  Continues metformin Enc further weight loss      Hyperlipidemia associated with type 2 diabetes mellitus ([place])    Disc goals for lipids and reasons to control them Rev last labs with pt Rev low sat fat diet in detail Taking her atorvastatin-has been well controlled         Other   Morbid obesity ([place])    Per pt 7 lb lost w/o snacking Enc more activity  Also better meals/ DM diet  Discussed how this problem influences overall health and the risks it imposes  Reviewed plan for weight loss with lower calorie diet (via better food choices and also portion control or program like weight watchers) and exercise building up to or more than 30 minutes 5 days per week including some aerobic activity             Follow Up Instructions:    I discussed the assessment and treatment plan with the patient. The patient was provided an opportunity to ask questions and all were answered. The patient agreed with the plan and demonstrated an understanding of the instructions.   The patient was advised to call back or seek an in-person evaluation if the symptoms worsen or if the condition fails to improve as anticipated.     Loura Pardon, MD

## 2018-05-07 ENCOUNTER — Other Ambulatory Visit (INDEPENDENT_AMBULATORY_CARE_PROVIDER_SITE_OTHER): Payer: BC Managed Care – PPO

## 2018-05-07 ENCOUNTER — Other Ambulatory Visit: Payer: Self-pay

## 2018-05-07 DIAGNOSIS — E119 Type 2 diabetes mellitus without complications: Secondary | ICD-10-CM

## 2018-05-07 LAB — POCT GLYCOSYLATED HEMOGLOBIN (HGB A1C): Hemoglobin A1C: 6.7 % — AB (ref 4.0–5.6)

## 2018-06-05 ENCOUNTER — Other Ambulatory Visit: Payer: Self-pay | Admitting: Family Medicine

## 2018-06-13 ENCOUNTER — Other Ambulatory Visit: Payer: BC Managed Care – PPO

## 2018-06-13 ENCOUNTER — Ambulatory Visit (INDEPENDENT_AMBULATORY_CARE_PROVIDER_SITE_OTHER): Payer: BC Managed Care – PPO | Admitting: Family Medicine

## 2018-06-13 ENCOUNTER — Telehealth: Payer: BC Managed Care – PPO | Admitting: Physician Assistant

## 2018-06-13 ENCOUNTER — Encounter: Payer: Self-pay | Admitting: Family Medicine

## 2018-06-13 VITALS — Temp 95.9°F | Ht 65.5 in

## 2018-06-13 DIAGNOSIS — N3090 Cystitis, unspecified without hematuria: Secondary | ICD-10-CM

## 2018-06-13 DIAGNOSIS — R3915 Urgency of urination: Secondary | ICD-10-CM | POA: Diagnosis not present

## 2018-06-13 DIAGNOSIS — R300 Dysuria: Secondary | ICD-10-CM

## 2018-06-13 LAB — POC URINALSYSI DIPSTICK (AUTOMATED)
Bilirubin, UA: NEGATIVE
Blood, UA: NEGATIVE
Glucose, UA: NEGATIVE
Ketones, UA: NEGATIVE
Nitrite, UA: NEGATIVE
Protein, UA: NEGATIVE
Spec Grav, UA: 1.015 (ref 1.010–1.025)
Urobilinogen, UA: 0.2 E.U./dL
pH, UA: 6 (ref 5.0–8.0)

## 2018-06-13 MED ORDER — CEPHALEXIN 500 MG PO CAPS: 500.0000 mg | ORAL_CAPSULE | Freq: Two times a day (BID) | ORAL | 0 refills | Status: AC

## 2018-06-13 NOTE — Progress Notes (Signed)
Jamie [name], [name] [place], [address] Phone: [phone]  FAX: [phone]  Jamie [name] - 63 y.o. female  MRN [id number]  Date of Birth: [birth year]  Visit Date: 06/13/2018  PCP: Abner Greenspan, Jamie [name]  Referred by: Tower, Wynelle Fanny, Jamie [name] Chief Complaint  Patient presents with  . Urinary Frequency  . Urinary Urgency   Virtual Visit via Video Note:  I connected with  Jamie [name] on 06/13/2018  3:20 PM EDT by a video enabled telemedicine application and verified that I am speaking with the correct person using two identifiers.   Location patient: home computer, tablet, or smartphone Location provider: work or home office Consent: Verbal consent directly obtained from Jamie [name]. Persons participating in the virtual visit: patient, provider  I discussed the limitations of evaluation and management by telemedicine and the availability of in person appointments. The patient expressed understanding and agreed to proceed.  History of Present Illness:  She is a very pleasant lady, and she has some pain over the last 2 or 3 days with some dysuria and urgency.  She is not having any fever or any other systemic symptoms.  It actually looks like she had an E-visit earlier in the day, but she does not think she had any response from this and did not know about the antibiotics that is Artie been sent to her pharmacy.  Review of Systems as above: See pertinent positives and pertinent negatives per HPI No acute distress verbally  Past Medical History, Surgical History, Social History, Family History, Problem List, Medications, and Allergies have been reviewed and updated if relevant.   Observations/Objective/Exam:  An attempt was made to discern vital signs over the phone and per patient if applicable and possible.   General:    Alert, Oriented, appears well and in no acute  distress HEENT:     Atraumatic, conjunctiva clear, no obvious abnormalities on inspection of external nose and ears.  Neck:    Normal movements of the head and neck Pulmonary:     On inspection no signs of respiratory distress, breathing rate appears normal, no obvious gross SOB, gasping or wheezing Cardiovascular:    No obvious cyanosis Musculoskeletal:    Moves all visible extremities without noticeable abnormality Psych / Neurological:     Pleasant and cooperative, no obvious depression or anxiety, speech and thought processing grossly intact  Results for orders placed or performed in visit on 06/13/18  POCT Urinalysis Dipstick (Automated)  Result Value Ref Range   Color, UA Yellow    Clarity, UA Clear    Glucose, UA Negative Negative   Bilirubin, UA Negative    Ketones, UA Negative    Spec Grav, UA 1.015 1.010 - 1.025   Blood, UA Negative    pH, UA 6.0 5.0 - 8.0   Protein, UA Negative Negative   Urobilinogen, UA 0.2 0.2 or 1.0 E.U./dL   Nitrite, UA Negative    Leukocytes, UA Small (1+) (A) Negative     Assessment and Plan:  Urinary urgency - Plan: POCT Urinalysis Dipstick (Automated), Urine Culture  Culture, use abx in pharm  I discussed the assessment and treatment plan with the patient. The patient was provided an opportunity to ask questions and all were answered. The patient agreed with the plan and demonstrated an understanding of the instructions.   The patient was advised to call back or seek  an in-person evaluation if the symptoms worsen or if the condition fails to improve as anticipated.  Follow-up: prn unless noted otherwise below No follow-ups on file.  No orders of the defined types were placed in this encounter.  Orders Placed This Encounter  Procedures  . Urine Culture  . POCT Urinalysis Dipstick (Automated)    Signed,  Krosby Ritchie T. Gabryella Murfin, Jamie [name]

## 2018-06-13 NOTE — Progress Notes (Signed)
I have spent 5 minutes in review of e-visit questionnaire, review and updating patient chart, medical decision making and response to patient.   Axel Meas Cody Allana Shrestha, PA-C    

## 2018-06-13 NOTE — Progress Notes (Signed)

## 2018-06-14 LAB — URINE CULTURE
MICRO NUMBER:: 496208
SPECIMEN QUALITY:: ADEQUATE

## 2018-07-01 ENCOUNTER — Ambulatory Visit: Payer: BC Managed Care – PPO | Admitting: Family Medicine

## 2018-07-01 ENCOUNTER — Other Ambulatory Visit: Payer: Self-pay

## 2018-07-01 ENCOUNTER — Encounter: Payer: Self-pay | Admitting: Family Medicine

## 2018-07-01 VITALS — BP 122/86 | HR 84 | Temp 98.1°F | Ht 65.5 in | Wt 248.0 lb

## 2018-07-01 DIAGNOSIS — L309 Dermatitis, unspecified: Secondary | ICD-10-CM

## 2018-07-01 DIAGNOSIS — L03211 Cellulitis of face: Secondary | ICD-10-CM | POA: Diagnosis not present

## 2018-07-01 DIAGNOSIS — B373 Candidiasis of vulva and vagina: Secondary | ICD-10-CM | POA: Insufficient documentation

## 2018-07-01 DIAGNOSIS — L0390 Cellulitis, unspecified: Secondary | ICD-10-CM | POA: Insufficient documentation

## 2018-07-01 DIAGNOSIS — B3731 Acute candidiasis of vulva and vagina: Secondary | ICD-10-CM | POA: Insufficient documentation

## 2018-07-01 MED ORDER — AMOXICILLIN-POT CLAVULANATE 875-125 MG PO TABS: 1.0000 | ORAL_TABLET | Freq: Two times a day (BID) | ORAL | 0 refills | Status: DC

## 2018-07-01 MED ORDER — ZOLPIDEM TARTRATE 10 MG PO TABS: 5.0000 mg | ORAL_TABLET | Freq: Every evening | ORAL | 0 refills | Status: DC | PRN

## 2018-07-01 MED ORDER — FLUCONAZOLE 150 MG PO TABS: ORAL_TABLET | ORAL | 0 refills | Status: DC

## 2018-07-01 NOTE — Progress Notes (Signed)
Subjective:    Patient ID: Jamie [name], female    DOB: 27-Apr-1955, 63 y.o.   MRN: [id number]  HPI Pt is here for R ear discomfort with swelling around it   Having discomfort in front of her ear  Started Saturday There is a swollen lump she can feel  Her ear lobe is sore as well as the skin in back of it  She can feel it with opening mouth wide (not prone to TMJ in the past)  Soreness has kept her from wearing her cpap mask (behind ear)  Now she had been breathing through her mouth open   Inside ear does not feel full   No rash or blisters   No fever or cough   She does have a pierced ear -does not think it is infected   She has a R upper tooth - with root canal/ she is scheduled for a new crown  That is not bothering her   She does have a yeast infection from last abx-using otc cream  Not quite gone   Cannot take cipro or sulfa Keflex caused an upset stomach  Does fine with PCN   Patient Active Problem List   Diagnosis Date Noted  . Cellulitis 07/01/2018  . Dermatitis 07/01/2018  . Yeast vaginitis 07/01/2018  . Left shoulder pain 11/30/2017  . Palpitations 07/05/2017  . Ostomy nurse consultation 06/05/2017  . Lichen planus [id number]/[id number]  . Hyperlipidemia associated with type 2 diabetes mellitus ([place]) 04/10/2016  . Red blood cell antibody positive 07/02/2013  . Elevated C-reactive protein (CRP) 05/15/2012  . Stricture of anal canal 11/21/2011  . Rectal discharge 10/02/2011  . Routine general medical examination at a health care facility 09/24/2011  . Apnea 11/14/2010  . Nevus of lower leg 11/14/2010  . Other screening mammogram 10/31/2010  . Stress reaction, emotional 05/02/2010  . Compulsive overeater 05/02/2010  . Arthritis   . Morbid obesity ([place])   . H/O small bowel obstruction   . KNEE PAIN 10/14/2009  . HOARSENESS 07/21/2009  . Diabetes type 2, controlled ([place]) 04/23/2009  . ROSACEA 09/12/2007  . Essential hypertension 05/15/2007  . OSA  (obstructive sleep apnea) 12/13/2006  . Ulcerative colitis ([place]) 07/12/2006   Past Medical History:  Diagnosis Date  . Arthritis    generalized  . Bowel obstruction (H[facility])    repeated (? possible adhesions) neg EGD  . Colitis    with colectomy  . Gall stones 2008  . Gastroenteritis 07/25/14   hosp for IVF   . HTN (hypertension)   . Hyperglycemia    mild-monitors A1C  . Obesity   . Rosacea   . Sleep apnea    CPAP everynight   Past Surgical History:  Procedure Laterality Date  . CHOLECYSTECTOMY    . COLECTOMY  1993   has ileostomy  . COLOSTOMY REVISION  12/06/2011   Procedure: COLOSTOMY REVISION;  Surgeon: Leighton Ruff, MD;  Location: [facility];  Service: General;  Laterality: Right;  OSTOMY revision  . EXAMINATION UNDER ANESTHESIA  12/06/2011   Procedure: EXAM UNDER ANESTHESIA;  Surgeon: Leighton Ruff, MD;  Location: [facility];  Service: General;  Laterality: N/A;  rectal exam under anesthesia, anal dilation, pouchoscopy    . TOTAL ABDOMINAL HYSTERECTOMY  05/2006   for abscessed ovaries   Social History   Tobacco Use  . Smoking status: Never Smoker  . Smokeless tobacco: Never Used  Substance Use Topics  . Alcohol use: No  Alcohol/week: 0.0 standard drinks  . Drug use: No   Family History  Problem Relation Age of Onset  . Cancer Father        colon  . Liver cancer Father        resection secondary to mets  . Hyperlipidemia Father   . Hypertension Father   . Allergies Father   . Ulcerative colitis Father   . Hypertension Mother   . Hyperlipidemia Mother   . Cirrhosis Mother        NASH  . Diabetes Mother   . Allergies Brother   . Breast cancer Paternal Grandmother   . Breast cancer Cousin 76       paternal cousins   Allergies  Allergen Reactions  . Lisinopril     REACTION: felt bad/ stomach hurt/ weak and tired  . Ciprofloxacin Rash    Erythema and pruritis around IV site, erythema and rash along vein  . Sulfa  Antibiotics Rash   Current Outpatient Medications on File Prior to Visit  Medication Sig Dispense Refill  . Acetaminophen (TYLENOL) 325 MG CAPS Take 2 capsules by mouth daily as needed.    Marland Kitchen atorvastatin (LIPITOR) 10 MG tablet Take 1/2 pill by mouth every other day 16 tablet 5  . Blood Glucose Monitoring Suppl (GLUCOMETER ELITE CLASSIC) KIT by Does not apply route daily. Check sugar once daily and as needed for DM2 250.0     . CINNAMON PO Take 1,000 mg by mouth daily.    . Esomeprazole Magnesium (NEXIUM PO) Take 1 tablet by mouth every other day.    Marland Kitchen glucose blood test strip 1 each by Other route daily. Reported on 07/16/2015    . hyoscyamine (LEVSIN SL) 0.125 MG SL tablet Take 1 tablet (0.125 mg total) by mouth every 4 (four) hours as needed. For abdominal cramps 90 tablet 3  . metFORMIN (GLUCOPHAGE) 500 MG tablet Take 1 tablet (500 mg total) by mouth 2 (two) times daily with a meal. 180 tablet 3  . metoprolol tartrate (LOPRESSOR) 25 MG tablet TAKE 1 TABLET BY MOUTH EVERY DAY 90 tablet 1  . METRONIDAZOLE, TOPICAL, 0.75 % LOTN Apply topically twice daily to rosacea areas 59 mL 1  . Multiple Vitamin (MULTIVITAMIN) tablet Take 1 tablet by mouth daily.      . naproxen sodium (ANAPROX) 220 MG tablet Take 220 mg by mouth 2 (two) times daily as needed.     . nitroGLYCERIN (NITRODUR - DOSED IN MG/24 HR) 0.2 mg/hr patch Place 1/4 to 1/2 of a patch over affected region. Remove and replace once daily.  Slightly alter skin placement daily 30 patch 1  . NON FORMULARY SLEEPS WITH BI-PAP    . ondansetron (ZOFRAN ODT) 4 MG disintegrating tablet Take 1 tablet (4 mg total) by mouth every 4 (four) hours as needed for nausea or vomiting. 20 tablet 0   No current facility-administered medications on file prior to visit.     Review of Systems  Constitutional: Negative for activity change, appetite change, fatigue, fever and unexpected weight change.  HENT: Positive for facial swelling. Negative for congestion,  ear discharge, ear pain, rhinorrhea, sinus pressure and sore throat.   Eyes: Negative for pain, redness and visual disturbance.  Respiratory: Negative for cough, shortness of breath and wheezing.   Cardiovascular: Negative for chest pain and palpitations.  Gastrointestinal: Negative for abdominal pain, blood in stool, constipation and diarrhea.  Endocrine: Negative for polydipsia and polyuria.  Genitourinary: Negative for dysuria, frequency and urgency.  Vaginal itching  Musculoskeletal: Negative for arthralgias, back pain and myalgias.  Skin: Positive for rash. Negative for pallor.  Allergic/Immunologic: Negative for environmental allergies.  Neurological: Negative for dizziness, syncope and headaches.  Hematological: Negative for adenopathy. Does not bruise/bleed easily.  Psychiatric/Behavioral: Negative for decreased concentration and dysphoric mood. The patient is not nervous/anxious.        Objective:   Physical Exam Constitutional:      General: She is not in acute distress.    Appearance: Normal appearance. She is obese. She is not ill-appearing or diaphoretic.  HENT:     Head: Normocephalic and atraumatic.     Right Ear: Tympanic membrane and ear canal normal. There is no impacted cerumen.     Left Ear: Tympanic membrane and ear canal normal. There is no impacted cerumen.     Ears:     Comments: Skin behind R ear is red with some scale Soft tissue in front is mildly swollen and warm    Nose: No congestion or rhinorrhea.     Mouth/Throat:     Mouth: Mucous membranes are moist.     Pharynx: Oropharynx is clear. No oropharyngeal exudate or posterior oropharyngeal erythema.     Comments: No dental abn noted Eyes:     General: No scleral icterus.       Right eye: No discharge.        Left eye: No discharge.     Extraocular Movements: Extraocular movements intact.     Conjunctiva/sclera: Conjunctivae normal.     Pupils: Pupils are equal, round, and reactive to light.   Neck:     Musculoskeletal: Normal range of motion and neck supple. No muscular tenderness.  Cardiovascular:     Rate and Rhythm: Normal rate and regular rhythm.  Pulmonary:     Effort: Pulmonary effort is normal. No respiratory distress.     Breath sounds: No wheezing.  Lymphadenopathy:     Head:     Right side of head: No submental, submandibular, preauricular, posterior auricular or occipital adenopathy.     Left side of head: No submental, submandibular, preauricular, posterior auricular or occipital adenopathy.     Cervical: No cervical adenopathy.     Right cervical: No superficial cervical adenopathy.    Left cervical: No superficial cervical adenopathy.  Skin:    General: Skin is warm and dry.     Findings: Erythema present.     Comments: Dermatitis noted behind R ear   Skin in front of R ear is warm with mild soft tissue swelling  Neurological:     Mental Status: She is alert.     Cranial Nerves: No cranial nerve deficit.     Sensory: No sensory deficit.  Psychiatric:        Mood and Affect: Mood normal.           Assessment & Plan:   Problem List Items Addressed This Visit      Musculoskeletal and Integument   Dermatitis    Flaky erythematous skin behind R ear  ? If from her mask/cpap mask material Will try otc cortisone ? If related to the tender area in front of ear (tx with abx)  Update if not starting to improve in a week or if worsening          Genitourinary   Yeast vaginitis    From last treated uti  Also starting abx today for suspected cellulitis  Will tx with one diflucan 150 now and one  at end of augmentin course Update if not starting to improve in a week or if worsening        Relevant Medications   fluconazole (DIFLUCAN) 150 MG tablet     Other   Cellulitis - Primary    Skin/soft tissue mild swelling and warmth in front of R ear and lower mandible  ? If related to dermatitis behind ear  Will tx with augmentin  Warm/cool compress  Watch for worsening and rash  Disc poss of parotiditis or dental cause (less likely) and what to watch for Update if not starting to improve in a week or if worsening

## 2018-07-01 NOTE — Assessment & Plan Note (Signed)
Skin/soft tissue mild swelling and warmth in front of R ear and lower mandible  ? If related to dermatitis behind ear  Will tx with augmentin  Warm/cool compress Watch for worsening and rash  Disc poss of parotiditis or dental cause (less likely) and what to watch for Update if not starting to improve in a week or if worsening

## 2018-07-01 NOTE — Assessment & Plan Note (Signed)
From last treated uti  Also starting abx today for suspected cellulitis  Will tx with one diflucan 150 now and one at end of augmentin course Update if not starting to improve in a week or if worsening

## 2018-07-01 NOTE — Assessment & Plan Note (Signed)
Flaky erythematous skin behind R ear  ? If from her mask/cpap mask material Will try otc cortisone ? If related to the tender area in front of ear (tx with abx)  Update if not starting to improve in a week or if worsening

## 2018-07-01 NOTE — Patient Instructions (Addendum)
Alternate warm/cool compresses on face where the swelling is  Take the augmentin as directed for possible skin/soft tissue infection  See dentist as planned also   Over the counter analgesics are ok for pain   Call if you develop increased swelling or redness or streaks of redness or rash or other symptoms  Also fever   Update if not starting to improve in a week or if worsening    I sent in diflucan to take now and in a week after antibiotics as well  Call if this does not help yeast infection

## 2018-09-09 ENCOUNTER — Encounter: Payer: Self-pay | Admitting: Family Medicine

## 2018-09-09 DIAGNOSIS — K9409 Other complications of colostomy: Secondary | ICD-10-CM

## 2018-09-10 DIAGNOSIS — K9409 Other complications of colostomy: Secondary | ICD-10-CM | POA: Insufficient documentation

## 2018-09-10 NOTE — Telephone Encounter (Signed)
Please call pt re: ref to [facility] Thanks

## 2018-10-04 ENCOUNTER — Other Ambulatory Visit: Payer: Self-pay | Admitting: Family Medicine

## 2018-10-04 NOTE — Telephone Encounter (Signed)
Name of Medication: Ambien Name of Pharmacy: [facility] [address] Dine or Written Date and Quantity: 07/01/18 #30 tabs with 0 refills  Last Office Visit and Type: ear problems on 07/01/18 Next Office Visit and Type: none scheduled  Last Controlled Substance Agreement Date: n/a Last UDS: n/a

## 2018-10-08 ENCOUNTER — Other Ambulatory Visit: Payer: Self-pay | Admitting: Family Medicine

## 2018-10-10 ENCOUNTER — Encounter: Payer: Self-pay | Admitting: Family Medicine

## 2018-10-10 MED ORDER — FLUCONAZOLE 150 MG PO TABS: 150.0000 mg | ORAL_TABLET | Freq: Once | ORAL | 0 refills | Status: AC

## 2018-12-05 ENCOUNTER — Other Ambulatory Visit: Payer: Self-pay | Admitting: Family Medicine

## 2018-12-13 LAB — HM DIABETES EYE EXAM

## 2019-01-02 ENCOUNTER — Encounter: Payer: Self-pay | Admitting: Internal Medicine

## 2019-01-02 ENCOUNTER — Ambulatory Visit (INDEPENDENT_AMBULATORY_CARE_PROVIDER_SITE_OTHER): Payer: BC Managed Care – PPO | Admitting: Internal Medicine

## 2019-01-02 DIAGNOSIS — G4733 Obstructive sleep apnea (adult) (pediatric): Secondary | ICD-10-CM | POA: Diagnosis not present

## 2019-01-02 NOTE — Progress Notes (Signed)
[facility] Consultation      I connected with the patient by telephone enabled telemedicine visit and verified that I am speaking with the correct person using two identifiers.    I discussed the limitations, risks, security and privacy concerns of performing an evaluation and management service by telemedicine and the availability of in-person appointments. I also discussed with the patient that there may be a patient responsible charge related to this service. The patient expressed understanding and agreed to proceed.  PATIENT AGREES AND CONFIRMS -YES   Other persons participating in the visit and their role in the encounter: Patient, nursing  This visit type was conducted due to national recommendations for restrictions regarding the COVID-19 Pandemic (e.g. social distancing).  This format is felt to be most appropriate for this patient at this time.  All issues noted in this document were discussed and addressed.         Jamie [name] is a 63 y.o. old female seen in consultation for chief complaint of:    CC FOLLOW UP OSA  HPI:  ON BIPAP FULL FACE MASK AMBIEN AS NEEDED EXCELLENT COMPLIANCE   No evidence of heart failure at this time No evidence or signs of infection at this time No respiratory distress No fevers, chills, nausea, vomiting, diarrhea No evidence of lower extremity edema No evidence hemoptysis     **CPAP download data 11/28/2017-12/28/2018>> raw data personally reviewed, she greater than 4 hours is 19/30 days (63%).  Average usage on days used for 33 minutes.  Pressure is 19/10.  Residual AHI is 5.9 leaks are minimal. **Review of 30 days of download data as of 12/21/16; usage is 30/30 days.  Average usage on days used is 5 hours 51 minutes.  Current settings are her curve 10 V auto; BiPAP 19/10 residual AHI is 3.2.  -home sleep study from the December 20th 2012:AHI 37.  No flowsheet data found.   Medication:       Allergies:   Lisinopril; Ciprofloxacin; and Sulfa antibiotics   Review of Systems:  Gen:  Denies  fever, sweats, chills weight loss  HEENT: Denies blurred vision, double vision, ear pain, eye pain, hearing loss, nose bleeds, sore throat Cardiac:  No dizziness, chest pain or heaviness, chest tightness,edema, No JVD Resp:   No cough, -sputum production, -shortness of breath,-wheezing, -hemoptysis,  Gi: Denies swallowing difficulty, stomach pain, nausea or vomiting, diarrhea, constipation, bowel incontinence Gu:  Denies bladder incontinence, burning urine Ext:   Denies Joint pain, stiffness or swelling Skin: Denies  skin rash, easy bruising or bleeding or hives Endoc:  Denies polyuria, polydipsia , polyphagia or weight change Psych:   Denies depression, insomnia or hallucinations  Other:  All other systems negative      Assessment and Plan:  SEVERE OSA ON BIPAP AND WORKING WELL BENEFITS FROM THERAPY AHI IS WELL CONTROLLED   COVID-19 EDUCATION: The signs and symptoms of COVID-19 were discussed with the patient and how to seek care for testing.  The importance of social distancing was discussed today. Hand Washing Techniques and avoid touching face was advised.   MEDICATION ADJUSTMENTS/LABS AND TESTS ORDERED: CONTINUE BIPAP AS PRESCRIBED AMBIEN AS NEEDED UP TO DATE FLU SHOT 9/920  CURRENT MEDICATIONS REVIEWED AT LENGTH WITH PATIENT TODAY   Patient satisfied with Plan of action and management. All questions answered  Follow up in [place], M.D.  [facility]  Medical Director  [facility]

## 2019-01-02 NOTE — Patient Instructions (Signed)
  MEDICATION ADJUSTMENTS/LABS AND TESTS ORDERED: CONTINUE BIPAP AS PRESCRIBED AMBIEN AS NEEDED UP TO DATE FLU SHOT 9/920

## 2019-01-24 HISTORY — PX: COLON SURGERY: SHX602

## 2019-02-03 ENCOUNTER — Other Ambulatory Visit: Payer: Self-pay | Admitting: *Deleted

## 2019-02-03 MED ORDER — ATORVASTATIN CALCIUM 10 MG PO TABS: ORAL_TABLET | ORAL | 1 refills | Status: DC

## 2019-02-05 ENCOUNTER — Telehealth: Payer: Self-pay

## 2019-02-05 NOTE — Telephone Encounter (Signed)
Apolonio Schneiders with [facility] left v/m wanting to verify that pt is still Dr Alba Cory pt and that Dr Glori Bickers will sign for diabetic supplies pt has ordered from [facility]. Rachel request cb at [phone] using ref # [id number].

## 2019-02-06 NOTE — Telephone Encounter (Signed)
Placed in your inbox yesterday afternoon

## 2019-02-06 NOTE — Telephone Encounter (Signed)
Done and in IN box 

## 2019-02-06 NOTE — Telephone Encounter (Signed)
Form faxed

## 2019-02-20 ENCOUNTER — Telehealth: Payer: Self-pay

## 2019-02-20 NOTE — Telephone Encounter (Signed)
Please ok those verbal orders

## 2019-02-20 NOTE — Telephone Encounter (Signed)
Verbal order given  

## 2019-02-20 NOTE — Telephone Encounter (Signed)
Error

## 2019-02-20 NOTE — Telephone Encounter (Signed)
Tiffany from [facility] contacted the office needing verbal order for PT - 1x week for 1 week and 2x week for 5 weeks and 1x week for 2 weeks. She can be reached at [phone], and it is ok to leave a VM if you can not reach her.

## 2019-02-26 ENCOUNTER — Other Ambulatory Visit: Payer: Self-pay | Admitting: Family Medicine

## 2019-02-27 NOTE — Telephone Encounter (Signed)
Name of Medication: Ambien Name of [facility] [address] or Written Date and Quantity: 10/04/18 #30 tabs with 0 refills  Last Office Visit and Type: ear problems on 07/01/18 Next Office Visit and Type: none scheduled  Last Controlled Substance Agreement Date: n/a Last UDS: n/a

## 2019-03-07 ENCOUNTER — Other Ambulatory Visit: Payer: Self-pay | Admitting: *Deleted

## 2019-03-07 MED ORDER — METFORMIN HCL 500 MG PO TABS: 500.0000 mg | ORAL_TABLET | Freq: Two times a day (BID) | ORAL | 0 refills | Status: DC

## 2019-04-28 ENCOUNTER — Other Ambulatory Visit: Payer: Self-pay | Admitting: Family Medicine

## 2019-04-28 NOTE — Telephone Encounter (Signed)
Please schedule PE and refill until then  

## 2019-04-28 NOTE — Telephone Encounter (Signed)
Pt hasn't had a lipid lab in over a year and no recent or future f/u or CPE appts., please advise

## 2019-04-29 NOTE — Telephone Encounter (Signed)
Morey Hummingbird will reach out to get CPE scheduled and med refilled once

## 2019-04-29 NOTE — Telephone Encounter (Signed)
I left a detailed message on patient's voice mail to call back and schedule labs and cpx.

## 2019-05-02 ENCOUNTER — Encounter: Payer: Self-pay | Admitting: Family Medicine

## 2019-05-02 MED ORDER — ZOLPIDEM TARTRATE 10 MG PO TABS: 5.0000 mg | ORAL_TABLET | Freq: Every evening | ORAL | 1 refills | Status: DC | PRN

## 2019-05-02 NOTE — Telephone Encounter (Signed)
Name of [place] Name of Pharmacy:[facility] [address] Dine or Written Date and Quantity:02/27/19 #30 tabs with 0 refills Last Office Visit and Type:ear problems on 07/01/18 Next Office Visit and Type:CPE on 05/14/19 Last Controlled Substance Agreement Date:n/a Last UDS:n/a

## 2019-05-05 ENCOUNTER — Telehealth: Payer: Self-pay | Admitting: Family Medicine

## 2019-05-05 DIAGNOSIS — I10 Essential (primary) hypertension: Secondary | ICD-10-CM

## 2019-05-05 DIAGNOSIS — E119 Type 2 diabetes mellitus without complications: Secondary | ICD-10-CM

## 2019-05-05 DIAGNOSIS — E785 Hyperlipidemia, unspecified: Secondary | ICD-10-CM

## 2019-05-05 DIAGNOSIS — E1169 Type 2 diabetes mellitus with other specified complication: Secondary | ICD-10-CM

## 2019-05-05 DIAGNOSIS — Z0000 Encounter for general adult medical examination without abnormal findings: Secondary | ICD-10-CM

## 2019-05-05 NOTE — Telephone Encounter (Signed)
-----   Message from Cloyd Stagers, RT sent at 05/05/2019  1:46 PM EDT ----- Regarding: Lab Orders for Thursday 4.15.2021 Please place lab orders for Thursday 4.15.2021, office visit for physical on Wednesday 4.21.2021 Thank you, Dyke Maes RT(R)

## 2019-05-08 ENCOUNTER — Other Ambulatory Visit (INDEPENDENT_AMBULATORY_CARE_PROVIDER_SITE_OTHER): Payer: BC Managed Care – PPO

## 2019-05-08 ENCOUNTER — Other Ambulatory Visit: Payer: Self-pay

## 2019-05-08 DIAGNOSIS — E119 Type 2 diabetes mellitus without complications: Secondary | ICD-10-CM

## 2019-05-08 DIAGNOSIS — I10 Essential (primary) hypertension: Secondary | ICD-10-CM

## 2019-05-08 DIAGNOSIS — E1169 Type 2 diabetes mellitus with other specified complication: Secondary | ICD-10-CM | POA: Diagnosis not present

## 2019-05-08 DIAGNOSIS — E785 Hyperlipidemia, unspecified: Secondary | ICD-10-CM | POA: Diagnosis not present

## 2019-05-08 LAB — COMPREHENSIVE METABOLIC PANEL
ALT: 19 U/L (ref 0–35)
AST: 24 U/L (ref 0–37)
Albumin: 3.8 g/dL (ref 3.5–5.2)
Alkaline Phosphatase: 90 U/L (ref 39–117)
BUN: 17 mg/dL (ref 6–23)
CO2: 21 mEq/L (ref 19–32)
Calcium: 9.7 mg/dL (ref 8.4–10.5)
Chloride: 100 mEq/L (ref 96–112)
Creatinine, Ser: 1.28 mg/dL — ABNORMAL HIGH (ref 0.40–1.20)
GFR: 41.86 mL/min — ABNORMAL LOW (ref >60.00)
Glucose, Bld: 166 mg/dL — ABNORMAL HIGH (ref 70–99)
Potassium: 4.3 mEq/L (ref 3.5–5.1)
Sodium: 132 mEq/L — ABNORMAL LOW (ref 135–145)
Total Bilirubin: 0.7 mg/dL (ref 0.2–1.2)
Total Protein: 6.7 g/dL (ref 6.0–8.3)

## 2019-05-08 LAB — LIPID PANEL
Cholesterol: 154 mg/dL (ref 0–200)
HDL: 46.5 mg/dL (ref >39.00)
NonHDL: 107.41
Total CHOL/HDL Ratio: 3
Triglycerides: 319 mg/dL — ABNORMAL HIGH (ref 0.0–149.0)
VLDL: 63.8 mg/dL — ABNORMAL HIGH (ref 0.0–40.0)

## 2019-05-08 LAB — MICROALBUMIN / CREATININE URINE RATIO
Creatinine,U: 146 mg/dL
Microalb Creat Ratio: 7.2 mg/g (ref 0.0–30.0)
Microalb, Ur: 10.5 mg/dL — ABNORMAL HIGH (ref 0.0–1.9)

## 2019-05-08 LAB — CBC WITH DIFFERENTIAL/PLATELET
Basophils Absolute: 0 10*3/uL (ref 0.0–0.1)
Basophils Relative: 0.3 % (ref 0.0–3.0)
Eosinophils Absolute: 0.4 10*3/uL (ref 0.0–0.7)
Eosinophils Relative: 4.3 % (ref 0.0–5.0)
HCT: 37 % (ref 36.0–46.0)
Hemoglobin: 12.2 g/dL (ref 12.0–15.0)
Lymphocytes Relative: 22.9 % (ref 12.0–46.0)
Lymphs Abs: 1.9 10*3/uL (ref 0.7–4.0)
MCHC: 33 g/dL (ref 30.0–36.0)
MCV: 91.7 fl (ref 78.0–100.0)
Monocytes Absolute: 0.7 10*3/uL (ref 0.1–1.0)
Monocytes Relative: 8 % (ref 3.0–12.0)
Neutro Abs: 5.4 10*3/uL (ref 1.4–7.7)
Neutrophils Relative %: 64.5 % (ref 43.0–77.0)
Platelets: 268 10*3/uL (ref 150.0–400.0)
RBC: 4.04 Mil/uL (ref 3.87–5.11)
RDW: 14.5 % (ref 11.5–15.5)
WBC: 8.4 10*3/uL (ref 4.0–10.5)

## 2019-05-08 LAB — LDL CHOLESTEROL, DIRECT: Direct LDL: 64 mg/dL

## 2019-05-08 LAB — HEMOGLOBIN A1C: Hgb A1c MFr Bld: 6.7 % — ABNORMAL HIGH (ref 4.6–6.5)

## 2019-05-08 LAB — TSH: TSH: 2.62 u[IU]/mL (ref 0.35–4.50)

## 2019-05-14 ENCOUNTER — Other Ambulatory Visit: Payer: Self-pay

## 2019-05-14 ENCOUNTER — Ambulatory Visit (INDEPENDENT_AMBULATORY_CARE_PROVIDER_SITE_OTHER): Payer: BC Managed Care – PPO | Admitting: Family Medicine

## 2019-05-14 ENCOUNTER — Encounter: Payer: Self-pay | Admitting: Family Medicine

## 2019-05-14 VITALS — BP 126/78 | HR 83 | Temp 97.5°F | Ht 65.25 in | Wt 220.5 lb

## 2019-05-14 DIAGNOSIS — E119 Type 2 diabetes mellitus without complications: Secondary | ICD-10-CM

## 2019-05-14 DIAGNOSIS — E785 Hyperlipidemia, unspecified: Secondary | ICD-10-CM

## 2019-05-14 DIAGNOSIS — K51019 Ulcerative (chronic) pancolitis with unspecified complications: Secondary | ICD-10-CM | POA: Diagnosis not present

## 2019-05-14 DIAGNOSIS — I10 Essential (primary) hypertension: Secondary | ICD-10-CM | POA: Diagnosis not present

## 2019-05-14 DIAGNOSIS — Z0000 Encounter for general adult medical examination without abnormal findings: Secondary | ICD-10-CM

## 2019-05-14 DIAGNOSIS — E1169 Type 2 diabetes mellitus with other specified complication: Secondary | ICD-10-CM

## 2019-05-14 DIAGNOSIS — E669 Obesity, unspecified: Secondary | ICD-10-CM

## 2019-05-14 DIAGNOSIS — R7989 Other specified abnormal findings of blood chemistry: Secondary | ICD-10-CM | POA: Insufficient documentation

## 2019-05-14 MED ORDER — ATORVASTATIN CALCIUM 10 MG PO TABS: 5.0000 mg | ORAL_TABLET | ORAL | 3 refills | Status: DC

## 2019-05-14 MED ORDER — METOPROLOL TARTRATE 25 MG PO TABS: 25.0000 mg | ORAL_TABLET | Freq: Every day | ORAL | 3 refills | Status: DC

## 2019-05-14 NOTE — Assessment & Plan Note (Signed)
Discussed how this problem influences overall health and the risks it imposes  Reviewed plan for weight loss with lower calorie diet (via better food choices and also portion control or program like weight watchers) and exercise building up to or more than 30 minutes 5 days per week including some aerobic activity   Commended on wt loss so far  Some from surgery - but is trying to keep it off now

## 2019-05-14 NOTE — Patient Instructions (Addendum)
Get your mammogram scheduled - here is the phone number   If you are interested in the new shingles vaccine (Shingrix) - call your local pharmacy to check on coverage and availability  If affordable, get on a wait list at your pharmacy to get the vaccine.  Continue eating better  Keep the weight off Continue the PT as well      Follow up in 6 months

## 2019-05-14 NOTE — Progress Notes (Signed)
Subjective:    Patient ID: Jamie [name], female    DOB: [birth year], 64 y.o.   MRN: [id number]  This visit occurred during the SARS-CoV-2 public health emergency.  Safety protocols were in place, including screening questions prior to the visit, additional usage of staff PPE, and extensive cleaning of exam room while observing appropriate contact time as indicated for disinfecting solutions.    HPI Here for health maintenance exam and to review chronic medical problems    Wt Readings from Last 3 Encounters:  05/14/19 220 lb 8 oz (100 kg)  07/01/18 248 lb (112.5 kg)  05/06/18 240 lb (108.9 kg)  has lost significant weight with a tough surgery /medical experience  36.41 kg/m   Eating better   Had her covid vaccines   Mammogram 2/20 Self breast exam -no lumps   Pap 3/08-hysterectomy   Colonoscopy-not needed due to colectomy for UC in the past  Had to have more surgery this year- built new stoma and removed rectum  Then plastic surgery to close flap-still packing wound now -has been very difficult (had abscess in buttock that req drain and IV abx) Other wounds have finally healed  No cancer   Stoma is "ok" (initially had leakage)  No yeast issues with it   Making slow progress -frustrated  Goes to [facility] regularly  Seeing PT for endurance  (also had some issues with orthostatic hypotension)    Flu vaccine 11/20 Tdap 9/14 Zoster status -had zostavax 12/16   Eye exam 11/20  Family hx: Father had colon cancer  Mother had NASH Brother- stomach cancer - has done well  (had a genetic marker)   Pt herself had genetic testing and was negative    bp is stable today  (was low for a while)  No cp or palpitations or headaches or edema  No side effects to medicines  BP Readings from Last 3 Encounters:  05/14/19 126/78  07/01/18 122/86  01/31/18 110/62    Uses cpap for sleep apnea  Pulse Readings from Last 3 Encounters:  05/14/19 83  07/01/18 84  01/31/18 85      DM2 Lab Results  Component Value Date   HGBA1C 6.7 (H) 05/08/2019  stable-no change from last check  Well controlled  Had gone up above 7 -saw endocrinology and got it in control before surgery  She was able to do it with diet  Lab Results  Component Value Date   MICROALBUR 10.5 (H) 05/08/2019   MICROALBUR 2.1 (H) 01/30/2018   Cannot take ace   Is now off metformin -it upsets her stomach Diet controlled only  Checks glucose daily - usually 130s fasting -very stable    Lab Results  Component Value Date   CREATININE 1.28 (H) 05/08/2019   BUN 17 05/08/2019   NA 132 (L) 05/08/2019   K 4.3 05/08/2019   CL 100 05/08/2019   CO2 21 05/08/2019  cr is up  GFR 41 Glucose was 166 at time of check   May have been low on fluids s/p surgery  Now pushing fluids    Hyperlipidemia  Lab Results  Component Value Date   CHOL 154 05/08/2019   CHOL 132 01/30/2018   CHOL 133 06/27/2017   Lab Results  Component Value Date   HDL 46.50 05/08/2019   HDL 50.70 01/30/2018   HDL 49.80 06/27/2017   Lab Results  Component Value Date   LDLCALC 49 06/27/2017   LDLCALC 76 01/05/2015   LDLCALC 65  09/25/2012   Lab Results  Component Value Date   TRIG 319.0 (H) 05/08/2019   TRIG 202.0 (H) 01/30/2018   TRIG 169.0 (H) 06/27/2017   Lab Results  Component Value Date   CHOLHDL 3 05/08/2019   CHOLHDL 3 01/30/2018   CHOLHDL 3 06/27/2017   Lab Results  Component Value Date   LDLDIRECT 64.0 05/08/2019   LDLDIRECT 62.0 01/30/2018   LDLDIRECT 83.0 01/08/2017   atorvastatin and diet  Watching triglycerides   Other labs Lab Results  Component Value Date   ALT 19 05/08/2019   AST 24 05/08/2019   ALKPHOS 90 05/08/2019   BILITOT 0.7 05/08/2019    Lab Results  Component Value Date   WBC 8.4 05/08/2019   HGB 12.2 05/08/2019   HCT 37.0 05/08/2019   MCV 91.7 05/08/2019   PLT 268.0 05/08/2019   Lab Results  Component Value Date   TSH 2.62 05/08/2019     Patient Active  Problem List   Diagnosis Date Noted  . Elevated serum creatinine 05/14/2019  . Cellulitis 07/01/2018  . Dermatitis 07/01/2018  . Yeast vaginitis 07/01/2018  . Left shoulder pain 11/30/2017  . Palpitations 07/05/2017  . Ostomy nurse consultation 06/05/2017  . Lichen planus 00/76/2263  . Hyperlipidemia associated with type 2 diabetes mellitus ([place]) 04/10/2016  . Obesity (BMI 30-39.9) 01/08/2015  . Red blood cell antibody positive 07/02/2013  . Elevated C-reactive protein (CRP) 05/15/2012  . Routine general medical examination at a health care facility 09/24/2011  . Apnea 11/14/2010  . Nevus of lower leg 11/14/2010  . Other screening mammogram 10/31/2010  . Stress reaction, emotional 05/02/2010  . Compulsive overeater 05/02/2010  . Arthritis   . H/O small bowel obstruction   . KNEE PAIN 10/14/2009  . HOARSENESS 07/21/2009  . Diabetes type 2, controlled ([place]) 04/23/2009  . ROSACEA 09/12/2007  . Essential hypertension 05/15/2007  . OSA (obstructive sleep apnea) 12/13/2006  . Ulcerative colitis ([place]) 07/12/2006   Past Medical History:  Diagnosis Date  . Arthritis    generalized  . Bowel obstruction (HCC)    repeated (? possible adhesions) neg EGD  . Colitis    with colectomy  . Gall stones 2008  . Gastroenteritis 07/25/14   hosp for IVF   . HTN (hypertension)   . Hyperglycemia    mild-monitors A1C  . Obesity   . Rosacea   . Sleep apnea    CPAP everynight   Past Surgical History:  Procedure Laterality Date  . CHOLECYSTECTOMY    . COLECTOMY  1993   has ileostomy  . COLOSTOMY REVISION  12/06/2011   Procedure: COLOSTOMY REVISION;  Surgeon: Leighton Ruff, MD;  Location: [facility];  Service: General;  Laterality: Right;  OSTOMY revision  . EXAMINATION UNDER ANESTHESIA  12/06/2011   Procedure: EXAM UNDER ANESTHESIA;  Surgeon: Leighton Ruff, MD;  [facility];  Service: General;  Laterality: N/A;  rectal exam under anesthesia, anal  dilation, pouchoscopy    . TOTAL ABDOMINAL HYSTERECTOMY  05/2006   for abscessed ovaries   Social History   Tobacco Use  . Smoking status: Never Smoker  . Smokeless tobacco: Never Used  Substance Use Topics  . Alcohol use: No    Alcohol/week: 0.0 standard drinks  . Drug use: No   Family History  Problem Relation Age of Onset  . Cancer Father        colon  . Liver cancer Father        resection secondary  to mets  . Hyperlipidemia Father   . Hypertension Father   . Allergies Father   . Ulcerative colitis Father   . Hypertension Mother   . Hyperlipidemia Mother   . Cirrhosis Mother        NASH  . Diabetes Mother   . Allergies Brother   . Gastric cancer Brother   . Breast cancer Paternal Grandmother   . Breast cancer Cousin 56       paternal cousins   Allergies  Allergen Reactions  . Lisinopril     REACTION: felt bad/ stomach hurt/ weak and tired  . Metformin And Related     GI  . Ciprofloxacin Rash    Erythema and pruritis around IV site, erythema and rash along vein  . Sulfa Antibiotics Rash   Current Outpatient Medications on File Prior to Visit  Medication Sig Dispense Refill  . Acetaminophen (TYLENOL) 325 MG CAPS Take 2 capsules by mouth daily as needed.    . Blood Glucose Monitoring Suppl (GLUCOMETER ELITE CLASSIC) KIT by Does not apply route daily. Check sugar once daily and as needed for DM2 250.0     . Cholecalciferol (VITAMIN D3) 25 MCG (1000 UT) CAPS Take 1 capsule by mouth daily.    . Esomeprazole Magnesium (NEXIUM PO) Take 1 tablet by mouth every other day.    Marland Kitchen glucose blood test strip 1 each by Other route daily. Reported on 07/16/2015    . hyoscyamine (LEVSIN SL) 0.125 MG SL tablet Take 1 tablet (0.125 mg total) by mouth every 4 (four) hours as needed. For abdominal cramps 90 tablet 3  . Multiple Vitamin (MULTIVITAMIN) tablet Take 1 tablet by mouth daily.      . NON FORMULARY SLEEPS WITH BI-PAP    . ondansetron (ZOFRAN ODT) 4 MG disintegrating tablet  Take 1 tablet (4 mg total) by mouth every 4 (four) hours as needed for nausea or vomiting. 20 tablet 0  . zolpidem (AMBIEN) 10 MG tablet Take 0.5-1 tablets (5-10 mg total) by mouth at bedtime as needed. for sleep 30 tablet 1   No current facility-administered medications on file prior to visit.    Review of Systems  Constitutional: Positive for fatigue. Negative for activity change, appetite change, fever and unexpected weight change.       Getting endurance back post surgically  HENT: Negative for congestion, ear pain, rhinorrhea, sinus pressure and sore throat.   Eyes: Negative for pain, redness and visual disturbance.  Respiratory: Negative for cough, shortness of breath and wheezing.   Cardiovascular: Negative for chest pain and palpitations.  Gastrointestinal: Negative for abdominal pain, blood in stool, constipation and diarrhea.  Endocrine: Negative for polydipsia and polyuria.  Genitourinary: Negative for dysuria, frequency and urgency.  Musculoskeletal: Negative for arthralgias, back pain and myalgias.  Skin: Positive for wound. Negative for pallor and rash.       Still painful buttock wound with packing-hard to sit  Allergic/Immunologic: Negative for environmental allergies.  Neurological: Negative for dizziness, syncope and headaches.  Hematological: Negative for adenopathy. Does not bruise/bleed easily.  Psychiatric/Behavioral: Negative for decreased concentration and dysphoric mood. The patient is not nervous/anxious.        Objective:   Physical Exam Constitutional:      General: She is not in acute distress.    Appearance: Normal appearance. She is well-developed. She is obese. She is not ill-appearing or diaphoretic.  HENT:     Head: Normocephalic and atraumatic.     Right Ear: Tympanic  membrane, ear canal and external ear normal.     Left Ear: Tympanic membrane, ear canal and external ear normal.     Nose: Nose normal. No congestion.     Mouth/Throat:     Mouth:  Mucous membranes are moist.     Pharynx: Oropharynx is clear. No posterior oropharyngeal erythema.  Eyes:     General: No scleral icterus.    Extraocular Movements: Extraocular movements intact.     Conjunctiva/sclera: Conjunctivae normal.     Pupils: Pupils are equal, round, and reactive to light.  Neck:     Thyroid: No thyromegaly.     Vascular: No carotid bruit or JVD.  Cardiovascular:     Rate and Rhythm: Normal rate and regular rhythm.     Pulses: Normal pulses.     Heart sounds: Normal heart sounds. No gallop.   Pulmonary:     Effort: Pulmonary effort is normal. No respiratory distress.     Breath sounds: Normal breath sounds. No wheezing.     Comments: Good air exch Chest:     Chest wall: No tenderness.  Abdominal:     General: Bowel sounds are normal. There is no distension or abdominal bruit.     Palpations: Abdomen is soft. There is no mass.     Tenderness: There is no abdominal tenderness.     Hernia: No hernia is present.  Genitourinary:    Comments: Breast exam: No mass, nodules, thickening, tenderness, bulging, retraction, inflamation, nipple discharge or skin changes noted.  No axillary or clavicular LA.     Musculoskeletal:        General: No tenderness. Normal range of motion.     Cervical back: Normal range of motion and neck supple. No rigidity. No muscular tenderness.     Right lower leg: No edema.     Left lower leg: No edema.  Lymphadenopathy:     Cervical: No cervical adenopathy.  Skin:    General: Skin is warm and dry.     Coloration: Skin is not pale.     Findings: No erythema or rash.     Comments: Fair, few sks    Neurological:     Mental Status: She is alert. Mental status is at baseline.     Cranial Nerves: No cranial nerve deficit.     Motor: No abnormal muscle tone.     Coordination: Coordination normal.     Gait: Gait normal.     Deep Tendon Reflexes: Reflexes are normal and symmetric. Reflexes normal.  Psychiatric:        Mood and  Affect: Mood normal.        Cognition and Memory: Cognition and memory normal.     Comments: Pleasant  Seems fatigued            Assessment & Plan:   Problem List Items Addressed This Visit      Cardiovascular and Mediastinum   Essential hypertension    bp in fair control at this time  BP Readings from Last 1 Encounters:  05/14/19 126/78   No changes needed Most recent labs reviewed  Disc lifstyle change with low sodium diet and exercise  If bp becomes low again (like it did post op) inst to let us know and we will hold the metoprolol      Relevant Medications   atorvastatin (LIPITOR) 10 MG tablet   metoprolol tartrate (LOPRESSOR) 25 MG tablet     Digestive   Ulcerative colitis ([place])  S/o more surgeries to revise/build new stoma and remove rectum Complicated course with infection and slow wound healing noted  In PT  Continues surgical f/u  One wound left (buttock) continues to pack  Now making better progress         Endocrine   Diabetes type 2, controlled ([place])    Lab Results  Component Value Date   HGBA1C 6.7 (H) 05/08/2019   Was previously seeing endocrinology  Was above 7 then back down with diet  Now 28 lb wt loss (also with surgery) No longer on metformin -intolerant  Diet controlled Enc her to check glucose at different times of day  Continue PT and better diet Lab Results  Component Value Date   MICROALBUR 10.5 (H) 05/08/2019   MICROALBUR 2.1 (H) 01/30/2018     watching this  On statin Good foot care and eye care  F/u in 6 mo      Relevant Medications   atorvastatin (LIPITOR) 10 MG tablet   Hyperlipidemia associated with type 2 diabetes mellitus (HCC)   Relevant Medications   atorvastatin (LIPITOR) 10 MG tablet     Other   Routine general medical examination at a health care facility - Primary    Reviewed health habits including diet and exercise and skin cancer prevention Reviewed appropriate screening tests for age  Also  reviewed health mt list, fam hx and immunization status , as well as social and family history   See HPI Labs reviewed  Noted new gastric cancer in brother (she had negative genetic testing)  Slow recovery from multiple surgeries Given phone # to schedule her mammogram  She plans to check on coverage for shingrix  Had covid vaccines        Obesity (BMI 30-39.9)    Discussed how this problem influences overall health and the risks it imposes  Reviewed plan for weight loss with lower calorie diet (via better food choices and also portion control or program like weight watchers) and exercise building up to or more than 30 minutes 5 days per week including some aerobic activity   Commended on wt loss so far  Some from surgery - but is trying to keep it off now      Elevated serum creatinine    1.28 After surgery/infections  Doing better now  Working to hydrate Also elevated microalb -but DM is better controlled Enc to avoid nsaids  Will continue to follow

## 2019-05-14 NOTE — Assessment & Plan Note (Signed)
1.28 After surgery/infections  Doing better now  Working to hydrate Also elevated microalb -but DM is better controlled Enc to avoid nsaids  Will continue to follow

## 2019-05-14 NOTE — Assessment & Plan Note (Signed)
Lab Results  Component Value Date   HGBA1C 6.7 (H) 05/08/2019   Was previously seeing endocrinology  Was above 7 then back down with diet  Now 28 lb wt loss (also with surgery) No longer on metformin -intolerant  Diet controlled Enc her to check glucose at different times of day  Continue PT and better diet Lab Results  Component Value Date   MICROALBUR 10.5 (H) 05/08/2019   MICROALBUR 2.1 (H) 01/30/2018     watching this  On statin Good foot care and eye care  F/u in 6 mo

## 2019-05-14 NOTE — Assessment & Plan Note (Signed)
Reviewed health habits including diet and exercise and skin cancer prevention Reviewed appropriate screening tests for age  Also reviewed health mt list, fam hx and immunization status , as well as social and family history   See HPI Labs reviewed  Noted new gastric cancer in brother (she had negative genetic testing)  Slow recovery from multiple surgeries Given phone # to schedule her mammogram  She plans to check on coverage for shingrix  Had covid vaccines

## 2019-05-14 NOTE — Assessment & Plan Note (Signed)
bp in fair control at this time  BP Readings from Last 1 Encounters:  05/14/19 126/78   No changes needed Most recent labs reviewed  Disc lifstyle change with low sodium diet and exercise  If bp becomes low again (like it did post op) inst to let us know and we will hold the metoprolol

## 2019-05-14 NOTE — Assessment & Plan Note (Signed)
S/o more surgeries to revise/build new stoma and remove rectum Complicated course with infection and slow wound healing noted  In PT  Continues surgical f/u  One wound left (buttock) continues to pack  Now making better progress

## 2019-05-19 ENCOUNTER — Other Ambulatory Visit: Payer: Self-pay | Admitting: Family Medicine

## 2019-05-19 DIAGNOSIS — Z1231 Encounter for screening mammogram for malignant neoplasm of breast: Secondary | ICD-10-CM

## 2019-05-31 ENCOUNTER — Other Ambulatory Visit: Payer: Self-pay | Admitting: Family Medicine

## 2019-06-13 ENCOUNTER — Encounter: Payer: Self-pay | Admitting: Family Medicine

## 2019-06-24 ENCOUNTER — Other Ambulatory Visit: Payer: Self-pay | Admitting: Family Medicine

## 2019-06-24 NOTE — Telephone Encounter (Signed)
[facility] called today to confirm we received their request in regards to the [place]. They stated they faxed this over on may 21st. They are requesting a script and clinical notes from the patient's last visit   [facility] Call back # [phone]

## 2019-06-24 NOTE — Telephone Encounter (Signed)
I will watch for it-not in my stack yet

## 2019-06-24 NOTE — Telephone Encounter (Signed)
I have not seen any ppw, will route to PCP to see if it's on her desk

## 2019-06-25 NOTE — Telephone Encounter (Signed)
Called and advised [facility] we haven't received form, they will refax it now

## 2019-06-26 ENCOUNTER — Other Ambulatory Visit: Payer: Self-pay | Admitting: Family Medicine

## 2019-06-26 MED ORDER — ACCU-CHEK GUIDE VI STRP: ORAL_STRIP | 3 refills | Status: DC

## 2019-06-26 MED ORDER — ACCU-CHEK GUIDE W/DEVICE KIT: PACK | 0 refills | Status: AC

## 2019-06-26 MED ORDER — ACCU-CHEK MULTICLIX LANCETS MISC: 3 refills | Status: AC

## 2019-06-26 NOTE — Telephone Encounter (Signed)
Called pt and she said she didn't ask for this she isn't on insulin and didn't think she needed this. She recently got [employer] since she turned 3 and this company has been "harassing" her since about getting one. Pt told the company [facility] that she didn't want or need the dexom and even though she told them she didn't want it they still sent [place] the forms which made pt upset. I called company and they told me that our office (Dr. Glori Bickers) requested this on 06/09/19, I advised company that I am from Dr. Marliss Coots office and we didn't request this. I advised them pt doesn't want this and we will not be filling forms out or sending an order in, they canceled the request.  Pt states that she checked with her insurance and they will cover just a regular meter (that's all pt wanted) Pt is requesting Accu-Chek meter and supplies sent to [facility] [address] called [facility] and they advised me they have accu-chek Guide in stock and I could just send in the Rxs electronically. I pended a new Rx so Dr. Glori Bickers could review and send order in with directions if she is okay with it

## 2019-06-26 NOTE — Addendum Note (Signed)
Addended by: Tammi Sou on: 06/26/2019 01:39 PM   Modules accepted: Orders

## 2019-06-26 NOTE — Addendum Note (Signed)
Addended by: Loura Pardon A on: 06/26/2019 02:55 PM   Modules accepted: Orders

## 2019-06-26 NOTE — Telephone Encounter (Signed)
Question 5-ask her what materials she needs and cross off what she does not   Question 7 - unsure if she wants transmitter or kit every 6 mo   Thanks

## 2019-06-26 NOTE — Telephone Encounter (Signed)
Received fax and placed it in Dr. Marliss Coots inbox for review

## 2019-06-26 NOTE — Telephone Encounter (Signed)
Aware, thanks   Px sent

## 2019-06-30 ENCOUNTER — Ambulatory Visit
Admission: RE | Admit: 2019-06-30 | Discharge: 2019-06-30 | Disposition: A | Discharge status: Home / Self Care | Payer: Medicare PPO | Source: Ambulatory Visit | Attending: Family Medicine | Admitting: Family Medicine

## 2019-06-30 DIAGNOSIS — Z1231 Encounter for screening mammogram for malignant neoplasm of breast: Secondary | ICD-10-CM | POA: Insufficient documentation

## 2019-09-29 ENCOUNTER — Encounter: Payer: Self-pay | Admitting: Family Medicine

## 2019-10-25 ENCOUNTER — Encounter: Payer: Self-pay | Admitting: Family Medicine

## 2019-10-31 ENCOUNTER — Telehealth: Payer: Self-pay

## 2019-10-31 NOTE — Telephone Encounter (Signed)
Pt left /vm that she was treated 10/20/19 after visit ot UC in [place] and was given NItrofurantoin 100 mg bid for 7 days. Pt said symptoms are re occuring. I spoke with pt; pt said after finishing the abx first of October the symptoms went away. Starting on 10/31/19 pt having spasms in lower mid back when urinates; no burning or pain upon urination. Pt does have frequency and urgency of urine. No fever.no abd pain but abdomen does not feel good; pt cannot discribe how abd does not feel good. No blood in urine at this time.No available appts at [facility];  Pt will go to [facility] in [place]. FYI to Dr Glori Bickers.

## 2019-10-31 NOTE — Telephone Encounter (Signed)
I can actually see her UC documents in care everywhere (she had gross blood in urine at that time) and then culture grew mixed flora (not positive for any predominant bacteria)  It sounds like the blood stopped which is good  Would consider kidney stone if flank pain or severe pain  If she cannot get into UC then I would like to repeat that culture  Keep me posted

## 2019-11-03 NOTE — Telephone Encounter (Signed)
That sounds fine, thanks

## 2019-11-03 NOTE — Telephone Encounter (Signed)
Called to f/u with pt, no records or UC eval. Pt said she increased her water intake and sxs started getting better over the weekend. Pt does want [place] to recheck urine cx to be on the safe side. Pt is coming in for labs on 11/06/19 and asked if PCP will put order to have her urine checked then. I made a note on the lab appt we will check urine cx also but will route to PCP for approval

## 2019-11-05 ENCOUNTER — Telehealth: Payer: Self-pay | Admitting: Family Medicine

## 2019-11-05 DIAGNOSIS — E785 Hyperlipidemia, unspecified: Secondary | ICD-10-CM

## 2019-11-05 DIAGNOSIS — I10 Essential (primary) hypertension: Secondary | ICD-10-CM

## 2019-11-05 DIAGNOSIS — E119 Type 2 diabetes mellitus without complications: Secondary | ICD-10-CM

## 2019-11-05 NOTE — Telephone Encounter (Signed)
-----   Message from Cloyd Stagers, RT sent at 10/21/2019  3:43 PM EDT ----- Regarding: Lab Orders for Thursday 10.14.2021 Please place lab orders for Thursday 10.14.2021, office visit for 6 month f/u on Thursday 10.21.2021 Thank you, Dyke Maes RT(R)

## 2019-11-06 ENCOUNTER — Other Ambulatory Visit: Payer: Self-pay

## 2019-11-06 ENCOUNTER — Other Ambulatory Visit (INDEPENDENT_AMBULATORY_CARE_PROVIDER_SITE_OTHER): Payer: Medicare PPO

## 2019-11-06 DIAGNOSIS — E119 Type 2 diabetes mellitus without complications: Secondary | ICD-10-CM | POA: Diagnosis not present

## 2019-11-06 DIAGNOSIS — E785 Hyperlipidemia, unspecified: Secondary | ICD-10-CM | POA: Diagnosis not present

## 2019-11-06 DIAGNOSIS — I10 Essential (primary) hypertension: Secondary | ICD-10-CM | POA: Diagnosis not present

## 2019-11-06 DIAGNOSIS — Z87440 Personal history of urinary (tract) infections: Secondary | ICD-10-CM | POA: Diagnosis not present

## 2019-11-06 DIAGNOSIS — E1169 Type 2 diabetes mellitus with other specified complication: Secondary | ICD-10-CM

## 2019-11-06 LAB — COMPREHENSIVE METABOLIC PANEL
ALT: 19 U/L (ref 0–35)
AST: 29 U/L (ref 0–37)
Albumin: 3.7 g/dL (ref 3.5–5.2)
Alkaline Phosphatase: 77 U/L (ref 39–117)
BUN: 13 mg/dL (ref 6–23)
CO2: 25 mEq/L (ref 19–32)
Calcium: 9.2 mg/dL (ref 8.4–10.5)
Chloride: 102 mEq/L (ref 96–112)
Creatinine, Ser: 1.28 mg/dL — ABNORMAL HIGH (ref 0.40–1.20)
GFR: 43.7 mL/min — ABNORMAL LOW (ref >60.00)
Glucose, Bld: 210 mg/dL — ABNORMAL HIGH (ref 70–99)
Potassium: 4.1 mEq/L (ref 3.5–5.1)
Sodium: 136 mEq/L (ref 135–145)
Total Bilirubin: 0.9 mg/dL (ref 0.2–1.2)
Total Protein: 6.9 g/dL (ref 6.0–8.3)

## 2019-11-06 LAB — HEMOGLOBIN A1C: Hgb A1c MFr Bld: 9 % — ABNORMAL HIGH (ref 4.6–6.5)

## 2019-11-06 LAB — LDL CHOLESTEROL, DIRECT: Direct LDL: 62 mg/dL

## 2019-11-06 LAB — LIPID PANEL
Cholesterol: 132 mg/dL (ref 0–200)
HDL: 43.4 mg/dL (ref >39.00)
NonHDL: 88.67
Total CHOL/HDL Ratio: 3
Triglycerides: 246 mg/dL — ABNORMAL HIGH (ref 0.0–149.0)
VLDL: 49.2 mg/dL — ABNORMAL HIGH (ref 0.0–40.0)

## 2019-11-07 LAB — URINE CULTURE
MICRO NUMBER:: 11072495
Result:: NO GROWTH
SPECIMEN QUALITY:: ADEQUATE

## 2019-11-13 ENCOUNTER — Other Ambulatory Visit: Payer: Self-pay

## 2019-11-13 ENCOUNTER — Encounter: Payer: Self-pay | Admitting: Family Medicine

## 2019-11-13 ENCOUNTER — Ambulatory Visit: Payer: BC Managed Care – PPO | Admitting: Family Medicine

## 2019-11-13 ENCOUNTER — Ambulatory Visit: Payer: Medicare PPO | Admitting: Family Medicine

## 2019-11-13 VITALS — BP 120/68 | HR 104 | Temp 97.3°F | Ht 65.25 in | Wt 241.2 lb

## 2019-11-13 DIAGNOSIS — E119 Type 2 diabetes mellitus without complications: Secondary | ICD-10-CM

## 2019-11-13 DIAGNOSIS — E669 Obesity, unspecified: Secondary | ICD-10-CM

## 2019-11-13 DIAGNOSIS — E1169 Type 2 diabetes mellitus with other specified complication: Secondary | ICD-10-CM | POA: Diagnosis not present

## 2019-11-13 DIAGNOSIS — I10 Essential (primary) hypertension: Secondary | ICD-10-CM | POA: Diagnosis not present

## 2019-11-13 DIAGNOSIS — E785 Hyperlipidemia, unspecified: Secondary | ICD-10-CM

## 2019-11-13 MED ORDER — SITAGLIPTIN PHOSPHATE 100 MG PO TABS: 100.0000 mg | ORAL_TABLET | Freq: Every day | ORAL | 5 refills | Status: DC

## 2019-11-13 NOTE — Progress Notes (Signed)
Subjective:    Patient ID: Jamie [name], female    DOB: 12-15-1955, 64 y.o.   MRN: [id number]  This visit occurred during the SARS-CoV-2 public health emergency.  Safety protocols were in place, including screening questions prior to the visit, additional usage of staff PPE, and extensive cleaning of exam room while observing appropriate contact time as indicated for disinfecting solutions.    HPI Pt presents for 6 mo f/u of chronic health problems   Wt Readings from Last 3 Encounters:  11/13/19 241 lb 3 oz (109.4 kg)  05/14/19 220 lb 8 oz (100 kg)  07/01/18 248 lb (112.5 kg)  emotional eater   39.83 kg/m   Feeling ok  Still recovering from her surgeries   Husband was dx with prostate cancer in July  Had surgery- will likely need further treatment   Brother has stage 4 stomach cancer   She has a lot going on    Had a uti visit in sept in [place]-tx with macrobid Repeat urine cx here 10/14 was clear   HTN bp is stable today  No cp or palpitations or headaches or edema  No side effects to medicines  BP Readings from Last 3 Encounters:  11/13/19 (!) 144/78  05/14/19 126/78  07/01/18 122/86    Re check is better BP: 120/68  After sitting  Pulse Readings from Last 3 Encounters:  11/13/19 (!) 104  05/14/19 83  07/01/18 84    Taking metoprolol 25 mg daily   Lab Results  Component Value Date   CREATININE 1.28 (H) 11/06/2019   BUN 13 11/06/2019   NA 136 11/06/2019   K 4.1 11/06/2019   CL 102 11/06/2019   CO2 25 11/06/2019   Cr is stable -does not drink enough water  GFR 43.7  Glucose 210   Hyperlipidemia Lab Results  Component Value Date   CHOL 132 11/06/2019   CHOL 154 05/08/2019   CHOL 132 01/30/2018   Lab Results  Component Value Date   HDL 43.40 11/06/2019   HDL 46.50 05/08/2019   HDL 50.70 01/30/2018   Lab Results  Component Value Date   LDLCALC 49 06/27/2017   LDLCALC 76 01/05/2015   LDLCALC 65 09/25/2012   Lab Results  Component Value  Date   TRIG 246.0 (H) 11/06/2019   TRIG 319.0 (H) 05/08/2019   TRIG 202.0 (H) 01/30/2018   Lab Results  Component Value Date   CHOLHDL 3 11/06/2019   CHOLHDL 3 05/08/2019   CHOLHDL 3 01/30/2018   Lab Results  Component Value Date   LDLDIRECT 62.0 11/06/2019   LDLDIRECT 64.0 05/08/2019   LDLDIRECT 62.0 01/30/2018   Atorvastatin 5 mg every other day Improved    DM2 Lab Results  Component Value Date   HGBA1C 9.0 (H) 11/06/2019  this is up significantly  Cannot tolerate metformin   Eating has been much worse  Eating more processed carbs Chips /pretzels  occ sweets-not a big problem No sweet drinks  Needs to walk- too busy   Lab Results  Component Value Date   MICROALBUR 10.5 (H) 05/08/2019   MICROALBUR 2.1 (H) 01/30/2018    Last A1C was 6.7   She checks glucose in am 150-160 fasting    Patient Active Problem List   Diagnosis Date Noted  . Elevated serum creatinine 05/14/2019  . Cellulitis 07/01/2018  . Dermatitis 07/01/2018  . Yeast vaginitis 07/01/2018  . Left shoulder pain 11/30/2017  . Palpitations 07/05/2017  . Ostomy nurse consultation  06/05/2017  . Lichen planus 86/[id number]/[id number]  . Hyperlipidemia associated with type 2 diabetes mellitus ([place]) 04/10/2016  . Obesity (BMI 30-39.9) 01/08/2015  . Red blood cell antibody positive 07/02/2013  . Elevated C-reactive protein (CRP) 05/15/2012  . Routine general medical examination at a health care facility 09/24/2011  . Apnea 11/14/2010  . Nevus of lower leg 11/14/2010  . Other screening mammogram 10/31/2010  . Stress reaction, emotional 05/02/2010  . Compulsive overeater 05/02/2010  . Arthritis   . H/O small bowel obstruction   . KNEE PAIN 10/14/2009  . HOARSENESS 07/21/2009  . Diabetes type 2, controlled ([place]) 04/23/2009  . ROSACEA 09/12/2007  . Essential hypertension 05/15/2007  . OSA (obstructive sleep apnea) 12/13/2006  . Ulcerative colitis ([place]) 07/12/2006   Past Medical History:  Diagnosis Date  .  Arthritis    generalized  . Bowel obstruction (HCC)    repeated (? possible adhesions) neg EGD  . Colitis    with colectomy  . Gall stones 2008  . Gastroenteritis 07/25/14   hosp for IVF   . HTN (hypertension)   . Hyperglycemia    mild-monitors A1C  . Obesity   . Rosacea   . Sleep apnea    CPAP everynight   Past Surgical History:  Procedure Laterality Date  . CHOLECYSTECTOMY    . COLECTOMY  1993   has ileostomy  . COLOSTOMY REVISION  12/06/2011   Procedure: COLOSTOMY REVISION;  Surgeon: Leighton Ruff, MD;  [facility];  Service: General;  Laterality: Right;  OSTOMY revision  . EXAMINATION UNDER ANESTHESIA  12/06/2011   Procedure: EXAM UNDER ANESTHESIA;  Surgeon: Leighton Ruff, MD;  Location: [facility];  Service: General;  Laterality: N/A;  rectal exam under anesthesia, anal dilation, pouchoscopy    . TOTAL ABDOMINAL HYSTERECTOMY  05/2006   for abscessed ovaries   Social History   Tobacco Use  . Smoking status: Never Smoker  . Smokeless tobacco: Never Used  Vaping Use  . Vaping Use: Never used  Substance Use Topics  . Alcohol use: No    Alcohol/week: 0.0 standard drinks  . Drug use: No   Family History  Problem Relation Age of Onset  . Cancer Father        colon  . Liver cancer Father        resection secondary to mets  . Hyperlipidemia Father   . Hypertension Father   . Allergies Father   . Ulcerative colitis Father   . Hypertension Mother   . Hyperlipidemia Mother   . Cirrhosis Mother        NASH  . Diabetes Mother   . Allergies Brother   . Gastric cancer Brother   . Breast cancer Paternal Grandmother   . Breast cancer Cousin 15       paternal cousins   Allergies  Allergen Reactions  . Lisinopril     REACTION: felt bad/ stomach hurt/ weak and tired  . Metformin And Related     GI  . Ciprofloxacin Rash    Erythema and pruritis around IV site, erythema and rash along vein  . Sulfa Antibiotics Rash   Current  Outpatient Medications on File Prior to Visit  Medication Sig Dispense Refill  . Acetaminophen (TYLENOL) 325 MG CAPS Take 2 capsules by mouth daily as needed.    Marland Kitchen atorvastatin (LIPITOR) 10 MG tablet TAKE 1/2 PILL BY MOUTH EVERY OTHER DAY 24 tablet 1  . Blood Glucose Monitoring Suppl (ACCU-CHEK GUIDE) w/Device KIT  To check glucose daily and as needed for type 2 diabetes 1 kit 0  . Cholecalciferol (VITAMIN D3) 25 MCG (1000 UT) CAPS Take 1 capsule by mouth daily.    . Esomeprazole Magnesium (NEXIUM PO) Take 1 tablet by mouth every other day.    Marland Kitchen glucose blood (ACCU-CHEK GUIDE) test strip To check glucose daily and as needed for type 2 diabetes 100 each 3  . hyoscyamine (LEVSIN SL) 0.125 MG SL tablet Take 1 tablet (0.125 mg total) by mouth every 4 (four) hours as needed. For abdominal cramps 90 tablet 3  . Lancets (ACCU-CHEK MULTICLIX) lancets To check glucose daily and as needed for diabetes type 2 100 each 3  . metoprolol tartrate (LOPRESSOR) 25 MG tablet Take 1 tablet (25 mg total) by mouth daily. 90 tablet 3  . Multiple Vitamin (MULTIVITAMIN) tablet Take 1 tablet by mouth daily.      . NON FORMULARY SLEEPS WITH BI-PAP    . nystatin (MYCOSTATIN) 100000 UNIT/ML suspension Take 5 mLs by mouth. rinse and spit medication    . ondansetron (ZOFRAN ODT) 4 MG disintegrating tablet Take 1 tablet (4 mg total) by mouth every 4 (four) hours as needed for nausea or vomiting. 20 tablet 0  . zolpidem (AMBIEN) 10 MG tablet Take 0.5-1 tablets (5-10 mg total) by mouth at bedtime as needed. for sleep 30 tablet 1   No current facility-administered medications on file prior to visit.    Review of Systems  Constitutional: Negative for activity change, appetite change, fatigue, fever and unexpected weight change.  HENT: Negative for congestion, ear pain, rhinorrhea, sinus pressure and sore throat.   Eyes: Negative for pain, redness and visual disturbance.  Respiratory: Negative for cough, shortness of breath and  wheezing.   Cardiovascular: Negative for chest pain and palpitations.  Gastrointestinal: Negative for abdominal pain, blood in stool, constipation and diarrhea.  Endocrine: Negative for polydipsia and polyuria.  Genitourinary: Negative for dysuria, frequency and urgency.  Musculoskeletal: Negative for arthralgias, back pain and myalgias.  Skin: Negative for pallor and rash.  Allergic/Immunologic: Negative for environmental allergies.  Neurological: Negative for dizziness, syncope and headaches.  Hematological: Negative for adenopathy. Does not bruise/bleed easily.  Psychiatric/Behavioral: Negative for decreased concentration and dysphoric mood. The patient is nervous/anxious.        Many stressors        Objective:   Physical Exam Constitutional:      General: She is not in acute distress.    Appearance: Normal appearance. She is well-developed. She is obese. She is not ill-appearing or diaphoretic.  HENT:     Head: Normocephalic and atraumatic.  Eyes:     Conjunctiva/sclera: Conjunctivae normal.     Pupils: Pupils are equal, round, and reactive to light.  Neck:     Thyroid: No thyromegaly.     Vascular: No carotid bruit or JVD.  Cardiovascular:     Rate and Rhythm: Normal rate and regular rhythm.     Heart sounds: Normal heart sounds. No gallop.   Pulmonary:     Effort: Pulmonary effort is normal. No respiratory distress.     Breath sounds: Normal breath sounds. No wheezing or rales.  Abdominal:     General: Bowel sounds are normal. There is no distension or abdominal bruit.     Palpations: Abdomen is soft. There is no mass.     Tenderness: There is no abdominal tenderness.     Comments: Ostomy bag unchanged  Musculoskeletal:     Cervical  back: Normal range of motion and neck supple.  Lymphadenopathy:     Cervical: No cervical adenopathy.  Skin:    General: Skin is warm and dry.     Coloration: Skin is not pale.     Findings: No erythema or rash.  Neurological:      Mental Status: She is alert.     Sensory: No sensory deficit.     Coordination: Coordination normal.     Deep Tendon Reflexes: Reflexes are normal and symmetric. Reflexes normal.  Psychiatric:        Mood and Affect: Mood normal.           Assessment & Plan:   Problem List Items Addressed This Visit      Cardiovascular and Mediastinum   Essential hypertension    bp in fair control at this time  BP Readings from Last 1 Encounters:  11/13/19 120/68   No changes needed Plan to continue metoprolol 25 mg daily Most recent labs reviewed  Disc lifstyle change with low sodium diet and exercise          Endocrine   Diabetes type 2, controlled ([place]) - Primary    Lab Results  Component Value Date   HGBA1C 9.0 (H) 11/06/2019   Up significantly from 6.7 Pt has been eating much more processed carb items and too busy to exercise She is motivated to work harder  Disc plan to fit in exercise and work towards low glycemic diet  Also discussed support for wt loss -considering the NOOM program  On statin  microalb is elevated  Cr is 1.28  Enc water intake  Intol of metformin - will change to januvia 100 mg daily Eye exam is due next month F/u 3 mo      Relevant Medications   sitaGLIPtin (JANUVIA) 100 MG tablet   Hyperlipidemia associated with type 2 diabetes mellitus (HCC)    Takes atorvastatin 5 mg QOD (as much as she can tolerate) LDL is well controlled Trig down a bit /still high and may relate to sugar levels      Relevant Medications   sitaGLIPtin (JANUVIA) 100 MG tablet     Other   Obesity (BMI 30-39.9)    Discussed how this problem influences overall health and the risks it imposes  Reviewed plan for weight loss with lower calorie diet (via better food choices and also portion control or program like weight watchers) and exercise building up to or more than 30 minutes 5 days per week including some aerobic activity   Discussed trial of the NOOM program to help  emotional eating       Relevant Medications   sitaGLIPtin (JANUVIA) 100 MG tablet

## 2019-11-13 NOTE — Patient Instructions (Addendum)
Think about the NOOM program for weight control and emotional eating   Please drink more fluids- aim for 64 oz per day-mostly water   Try to get most of your carbohydrates from produce (with the exception of white potatoes)  Eat less bread/pasta/rice/snack foods/cereals/sweets and other items from the middle of the grocery store (processed carbs)  Start walking - aim for 30 or more minutes per day  Or whatever exercise you like   Start checking glucose different times of day and logging   Take januvia once daily  If any side effects or problems let me know   Do not skip meals   F/u in 3 months

## 2019-11-14 NOTE — Assessment & Plan Note (Signed)
bp in fair control at this time  BP Readings from Last 1 Encounters:  11/13/19 120/68   No changes needed Plan to continue metoprolol 25 mg daily Most recent labs reviewed  Disc lifstyle change with low sodium diet and exercise

## 2019-11-14 NOTE — Assessment & Plan Note (Signed)
Lab Results  Component Value Date   HGBA1C 9.0 (H) 11/06/2019   Up significantly from 6.7 Pt has been eating much more processed carb items and too busy to exercise She is motivated to work harder  Disc plan to fit in exercise and work towards low glycemic diet  Also discussed support for wt loss -considering the NOOM program  On statin  microalb is elevated  Cr is 1.28  Enc water intake  Intol of metformin - will change to januvia 100 mg daily Eye exam is due next month F/u 3 mo

## 2019-11-14 NOTE — Assessment & Plan Note (Signed)
Discussed how this problem influences overall health and the risks it imposes  Reviewed plan for weight loss with lower calorie diet (via better food choices and also portion control or program like weight watchers) and exercise building up to or more than 30 minutes 5 days per week including some aerobic activity   Discussed trial of the NOOM program to help emotional eating

## 2019-11-14 NOTE — Assessment & Plan Note (Signed)
Takes atorvastatin 5 mg QOD (as much as she can tolerate) LDL is well controlled Trig down a bit /still high and may relate to sugar levels

## 2019-11-18 ENCOUNTER — Encounter: Payer: Self-pay | Admitting: Family Medicine

## 2019-12-11 ENCOUNTER — Other Ambulatory Visit: Payer: Self-pay | Admitting: Gastroenterology

## 2019-12-11 DIAGNOSIS — Z8719 Personal history of other diseases of the digestive system: Secondary | ICD-10-CM

## 2019-12-11 DIAGNOSIS — K9413 Enterostomy malfunction: Secondary | ICD-10-CM

## 2019-12-17 ENCOUNTER — Encounter: Payer: Self-pay | Admitting: Family Medicine

## 2019-12-23 ENCOUNTER — Encounter: Payer: Self-pay | Admitting: Family Medicine

## 2019-12-23 DIAGNOSIS — R350 Frequency of micturition: Secondary | ICD-10-CM | POA: Insufficient documentation

## 2020-01-04 MED ORDER — EMPAGLIFLOZIN 10 MG PO TABS: 10.0000 mg | ORAL_TABLET | Freq: Every day | ORAL | 1 refills | Status: DC

## 2020-01-12 ENCOUNTER — Ambulatory Visit: Payer: Medicare PPO | Admitting: Internal Medicine

## 2020-01-12 ENCOUNTER — Ambulatory Visit: Payer: Medicare PPO | Admitting: Pulmonary Disease

## 2020-01-12 ENCOUNTER — Encounter: Payer: Self-pay | Admitting: Internal Medicine

## 2020-01-12 ENCOUNTER — Other Ambulatory Visit: Payer: Self-pay

## 2020-01-12 VITALS — BP 128/72 | HR 79 | Temp 97.2°F | Ht 65.25 in | Wt 243.0 lb

## 2020-01-12 DIAGNOSIS — H6983 Other specified disorders of Eustachian tube, bilateral: Secondary | ICD-10-CM | POA: Insufficient documentation

## 2020-01-12 MED ORDER — PREDNISONE 20 MG PO TABS: 40.0000 mg | ORAL_TABLET | Freq: Every day | ORAL | 0 refills | Status: DC

## 2020-01-12 NOTE — Progress Notes (Signed)
Subjective:    Patient ID: RHYLI DEPAULA, female    DOB: 12-24-1955, 64 y.o.   MRN: [id number]  HPI Visit due to ear problems This visit occurred during the SARS-CoV-2 public health emergency.  Safety protocols were in place, including screening questions prior to the visit, additional usage of staff PPE, and extensive cleaning of exam room while observing appropriate contact time as indicated for disinfecting solutions.   Both ears feel full Some sinus drainage for some time Ear problems started at least 1 month ago Right side is worse---will "gurgle" while she walks around Has tried flonase and mucinex decongestant--helps the nose but not her ears  No recent illness No recent travel---air flight or mountains  Has had past episodes like this Did see ENT in the past Can pop the ears and they open---but then close again  Current Outpatient Medications on File Prior to Visit  Medication Sig Dispense Refill  . Acetaminophen 325 MG CAPS Take 2 capsules by mouth daily as needed.    Marland Kitchen atorvastatin (LIPITOR) 10 MG tablet TAKE 1/2 PILL BY MOUTH EVERY OTHER DAY 24 tablet 1  . Blood Glucose Monitoring Suppl (ACCU-CHEK GUIDE) w/Device KIT To check glucose daily and as needed for type 2 diabetes 1 kit 0  . Cholecalciferol (VITAMIN D3) 25 MCG (1000 UT) CAPS Take 1 capsule by mouth daily.    . empagliflozin (JARDIANCE) 10 MG TABS tablet Take 1 tablet (10 mg total) by mouth daily before breakfast. 90 tablet 1  . Esomeprazole Magnesium (NEXIUM PO) Take 1 tablet by mouth every other day.    . fluticasone (FLONASE) 50 MCG/ACT nasal spray Place 1 spray into both nostrils daily.    Marland Kitchen glucose blood (ACCU-CHEK GUIDE) test strip To check glucose daily and as needed for type 2 diabetes 100 each 3  . hyoscyamine (LEVSIN SL) 0.125 MG SL tablet Take 1 tablet (0.125 mg total) by mouth every 4 (four) hours as needed. For abdominal cramps 90 tablet 3  . Lancets (ACCU-CHEK MULTICLIX) lancets To check glucose  daily and as needed for diabetes type 2 100 each 3  . metoprolol tartrate (LOPRESSOR) 25 MG tablet Take 1 tablet (25 mg total) by mouth daily. 90 tablet 3  . Multiple Vitamin (MULTIVITAMIN) tablet Take 1 tablet by mouth daily.    . NON FORMULARY SLEEPS WITH BI-PAP    . ondansetron (ZOFRAN ODT) 4 MG disintegrating tablet Take 1 tablet (4 mg total) by mouth every 4 (four) hours as needed for nausea or vomiting. 20 tablet 0  . zolpidem (AMBIEN) 10 MG tablet Take 0.5-1 tablets (5-10 mg total) by mouth at bedtime as needed. for sleep 30 tablet 1   No current facility-administered medications on file prior to visit.    Allergies  Allergen Reactions  . Lisinopril     REACTION: felt bad/ stomach hurt/ weak and tired  . Metformin And Related     GI  . Ciprofloxacin Rash    Erythema and pruritis around IV site, erythema and rash along vein  . Sulfa Antibiotics Rash    Past Medical History:  Diagnosis Date  . Arthritis    generalized  . Bowel obstruction (HCC)    repeated (? possible adhesions) neg EGD  . Colitis    with colectomy  . Gall stones 2008  . Gastroenteritis 07/25/14   hosp for IVF   . HTN (hypertension)   . Hyperglycemia    mild-monitors A1C  . Obesity   . Rosacea   .  Sleep apnea    CPAP everynight    Past Surgical History:  Procedure Laterality Date  . CHOLECYSTECTOMY    . COLECTOMY  1993   has ileostomy  . COLOSTOMY REVISION  12/06/2011   Procedure: COLOSTOMY REVISION;  Surgeon: Leighton Ruff, MD;  Location: [facility];  Service: General;  Laterality: Right;  OSTOMY revision  . EXAMINATION UNDER ANESTHESIA  12/06/2011   Procedure: EXAM UNDER ANESTHESIA;  Surgeon: Leighton Ruff, MD;  [facility];  Service: General;  Laterality: N/A;  rectal exam under anesthesia, anal dilation, pouchoscopy    . TOTAL ABDOMINAL HYSTERECTOMY  05/2006   for abscessed ovaries    Family History  Problem Relation Age of Onset  . Cancer Father         colon  . Liver cancer Father        resection secondary to mets  . Hyperlipidemia Father   . Hypertension Father   . Allergies Father   . Ulcerative colitis Father   . Hypertension Mother   . Hyperlipidemia Mother   . Cirrhosis Mother        NASH  . Diabetes Mother   . Allergies Brother   . Gastric cancer Brother   . Breast cancer Paternal Grandmother   . Breast cancer Cousin 12       paternal cousins    Social History   Socioeconomic History  . Marital status: Married    Spouse name: Not on file  . Number of children: 3  . Years of education: Not on file  . Highest education level: Not on file  Occupational History  . Occupation: Guardian Ad Litem  Tobacco Use  . Smoking status: Never Smoker  . Smokeless tobacco: Never Used  Vaping Use  . Vaping Use: Never used  Substance and Sexual Activity  . Alcohol use: No    Alcohol/week: 0.0 standard drinks  . Drug use: No  . Sexual activity: Not on file  Other Topics Concern  . Not on file  Social History Narrative  . Not on file   Social Determinants of Health   Financial Resource Strain: Not on file  Food Insecurity: Not on file  Transportation Needs: Not on file  Physical Activity: Not on file  Stress: Not on file  Social Connections: Not on file  Intimate Partner Violence: Not on file   Review of Systems  Doesn't take allergy meds in general--zyrtec in the past May have mild hearing loss--sound higher on TV Some tinnitus apparent when quiet in the house Sleeps with BiPap---does clean this regularly     Objective:   Physical Exam HENT:     Ears:     Comments: ?slight retraction in both TMs No inflammation No cerumen    Nose:     Comments: ?polyp on right           Assessment & Plan:

## 2020-01-12 NOTE — Assessment & Plan Note (Signed)
No infection Will try prednisone 52m for 3 days then 20 mg for 3 days ENT if persists

## 2020-01-12 NOTE — Patient Instructions (Signed)
Please try the prednisone to unclog your ears. I would recommend an appointment with [facility] if that doesn't work. Let us know if you need help with a referral.

## 2020-01-13 ENCOUNTER — Ambulatory Visit
Admission: RE | Admit: 2020-01-13 | Discharge: 2020-01-13 | Disposition: A | Discharge status: Home / Self Care | Payer: Medicare PPO | Source: Ambulatory Visit | Attending: Gastroenterology | Admitting: Gastroenterology

## 2020-01-13 DIAGNOSIS — K9413 Enterostomy malfunction: Secondary | ICD-10-CM | POA: Insufficient documentation

## 2020-01-13 DIAGNOSIS — E119 Type 2 diabetes mellitus without complications: Secondary | ICD-10-CM | POA: Insufficient documentation

## 2020-01-13 DIAGNOSIS — Z13820 Encounter for screening for osteoporosis: Secondary | ICD-10-CM | POA: Diagnosis present

## 2020-01-13 DIAGNOSIS — Z8719 Personal history of other diseases of the digestive system: Secondary | ICD-10-CM

## 2020-01-13 DIAGNOSIS — Z780 Asymptomatic menopausal state: Secondary | ICD-10-CM | POA: Insufficient documentation

## 2020-01-27 ENCOUNTER — Encounter: Payer: Self-pay | Admitting: Family Medicine

## 2020-01-27 NOTE — Telephone Encounter (Signed)
See mychart message. appt notes says it's a 3 month f/u. ? If pt needs labs prior or same day is fine or are you only doing A1c, please advise

## 2020-01-28 ENCOUNTER — Ambulatory Visit: Payer: Medicare PPO | Admitting: Primary Care

## 2020-01-28 ENCOUNTER — Other Ambulatory Visit: Payer: Self-pay

## 2020-01-28 ENCOUNTER — Encounter: Payer: Self-pay | Admitting: Primary Care

## 2020-01-28 VITALS — BP 126/74 | HR 89 | Temp 97.1°F | Ht 65.5 in | Wt 242.6 lb

## 2020-01-28 DIAGNOSIS — G4733 Obstructive sleep apnea (adult) (pediatric): Secondary | ICD-10-CM | POA: Diagnosis not present

## 2020-01-28 NOTE — Patient Instructions (Signed)
Pleasure meeting you today Jamie [name]  Recommendations: Continue to wear BIPAP every night for minimum 4-6 hours or more Do not drive if experiencing excessive daytime fatigue or somnolence   Orders: Renew PAP supplies, printing order to send to [facility] medical  Follow-up: 1 year with Dr. Mortimer Fries    CPAP and BPAP Information CPAP and BPAP are methods of helping a person breathe with the use of air pressure. CPAP stands for "continuous positive airway pressure." BPAP stands for "bi-level positive airway pressure." In both methods, air is blown through your nose or mouth and into your air passages to help you breathe well. CPAP and BPAP use different amounts of pressure to blow air. With CPAP, the amount of pressure stays the same while you breathe in and out. With BPAP, the amount of pressure is increased when you breathe in (inhale) so that you can take larger breaths. Your health care provider will recommend whether CPAP or BPAP would be more helpful for you. Why are CPAP and BPAP treatments used? CPAP or BPAP can be helpful if you have:  Sleep apnea.  Chronic obstructive pulmonary disease (COPD).  Heart failure.  Medical conditions that weaken the muscles of the chest including muscular dystrophy, or neurological diseases such as amyotrophic lateral sclerosis (ALS).  Other problems that cause breathing to be weak, abnormal, or difficult. CPAP is most commonly used for obstructive sleep apnea (OSA) to keep the airways from collapsing when the muscles relax during sleep. How is CPAP or BPAP administered? Both CPAP and BPAP are provided by a small machine with a flexible plastic tube that attaches to a plastic mask. You wear the mask. Air is blown through the mask into your nose or mouth. The amount of pressure that is used to blow the air can be adjusted on the machine. Your health care provider will determine the pressure setting that should be used based on your individual needs. When  should CPAP or BPAP be used? In most cases, the mask only needs to be worn during sleep. Generally, the mask needs to be worn throughout the night and during any daytime naps. People with certain medical conditions may also need to wear the mask at other times when they are awake. Follow instructions from your health care provider about when to use the machine. What are some tips for using the mask?   Because the mask needs to be snug, some people feel trapped or closed-in (claustrophobic) when first using the mask. If you feel this way, you may need to get used to the mask. One way to do this is by holding the mask loosely over your nose or mouth and then gradually applying the mask more snugly. You can also gradually increase the amount of time that you use the mask.  Masks are available in various types and sizes. Some fit over your mouth and nose while others fit over just your nose. If your mask does not fit well, talk with your health care provider about getting a different one.  If you are using a mask that fits over your nose and you tend to breathe through your mouth, a chin strap may be applied to help keep your mouth closed.  The CPAP and BPAP machines have alarms that may sound if the mask comes off or develops a leak.  If you have trouble with the mask, it is very important that you talk with your health care provider about finding a way to make the mask  easier to tolerate. Do not stop using the mask. Stopping the use of the mask could have a negative impact on your health. What are some tips for using the machine?  Place your CPAP or BPAP machine on a secure table or stand near an electrical outlet.  Know where the on/off switch is located on the machine.  Follow instructions from your health care provider about how to set the pressure on your machine and when you should use it.  Do not eat or drink while the CPAP or BPAP machine is on. Food or fluids could get pushed into your  lungs by the pressure of the CPAP or BPAP.  Do not smoke. Tobacco smoke residue can damage the machine.  For home use, CPAP and BPAP machines can be rented or purchased through home health care companies. Many different brands of machines are available. Renting a machine before purchasing may help you find out which particular machine works well for you.  Keep the CPAP or BPAP machine and attachments clean. Ask your health care provider for specific instructions. Get help right away if:  You have redness or open areas around your nose or mouth where the mask fits.  You have trouble using the CPAP or BPAP machine.  You cannot tolerate wearing the CPAP or BPAP mask.  You have pain, discomfort, and bloating in your abdomen. Summary  CPAP and BPAP are methods of helping a person breathe with the use of air pressure.  Both CPAP and BPAP are provided by a small machine with a flexible plastic tube that attaches to a plastic mask.  If you have trouble with the mask, it is very important that you talk with your health care provider about finding a way to make the mask easier to tolerate. This information is not intended to replace advice given to you by your health care provider. Make sure you discuss any questions you have with your health care provider. Document Revised: 05/01/2018 Document Reviewed: 11/29/2015 Elsevier Patient Education  [place].

## 2020-01-28 NOTE — Progress Notes (Signed)
@Patient  ID: Jamie [name], female    DOB: December 28, 1955, 65 y.o.   MRN: [id number]  Chief Complaint  Patient presents with  . Follow-up    Wearing cpap avg 4-6hr nightly- feels pressure and pressure is okay.     Referring provider: Tower, Wynelle Fanny, MD  HPI: 65 year old female, never smoked.  Past medical history significant for HTN, OSA, type 2 diabetes, hyperlipidemia. Patient of Dr. Mortimer Fries, last seen on 01/02/19. HST 01/12/11, AHI 37/hr.  Patient maintained on BIPAP 19/10.   01/28/2020 Patient presents today for annual follow-up up for sleep apnea. She is doing well, 100% compliant with BIPAP use. No residual daytime fatigue. No issues with mask fit or pressure setting. She wears full face mask. She wants to change DME companies. She currently uses [employer] online to receive other medical supplies and is interested in getting PAP supplies from them as well. She owns her BIPAP machine. She gets on average 7-8 hours of sleep. She starts off sleeping in bed then moves to the recliner. She has to get up several times at night d/t her ostomy. She takes Ambien 2.5-27m as needed, she soldemly needs to take this.   [facility] download 12/28/19- 01/26/19: Usage: 30/3- days (100%); 18 days (60%) > 4 hours Average usage: 4 hours 8 mins Pressure: IPAP 19 cm h20; EPAP 10 cm h20 Leaks: 13.9L/min (95%) AHI: 3.8  Allergies  Allergen Reactions  . Lisinopril     REACTION: felt bad/ stomach hurt/ weak and tired  . Metformin And Related     GI  . Ciprofloxacin Rash    Erythema and pruritis around IV site, erythema and rash along vein  . Sulfa Antibiotics Rash    Immunization History  Administered Date(s) Administered  . Influenza Inj Mdck Quad Pf 10/02/2018, 12/13/2018  . Influenza Split 10/13/2010, 10/02/2011, 10/23/2013  . Influenza Whole 12/13/2006, 11/04/2009  . Influenza, High Dose Seasonal PF 09/29/2019  . Influenza,inj,Quad PF,6+ Mos 10/02/2012, 11/04/2014, 10/11/2017, 10/02/2018  .  Influenza-Unspecified 10/08/2015, 10/02/2016, 09/29/2019  . PFIZER SARS-COV-2 Vaccination 04/14/2019, 05/05/2019, 11/18/2019  . Pneumococcal Polysaccharide-23 12/09/2013  . Td 01/23/2002  . Tdap 10/02/2012  . Zoster 01/08/2015  . Zoster Recombinat (Shingrix) 10/25/2019, 01/06/2020    Past Medical History:  Diagnosis Date  . Arthritis    generalized  . Bowel obstruction (HCC)    repeated (? possible adhesions) neg EGD  . Colitis    with colectomy  . Gall stones 2008  . Gastroenteritis 07/25/14   hosp for IVF   . HTN (hypertension)   . Hyperglycemia    mild-monitors A1C  . Obesity   . Rosacea   . Sleep apnea    CPAP everynight    Tobacco History: Social History   Tobacco Use  Smoking Status Never Smoker  Smokeless Tobacco Never Used   Counseling given: Not Answered   Outpatient Medications Prior to Visit  Medication Sig Dispense Refill  . Acetaminophen 325 MG CAPS Take 2 capsules by mouth daily as needed.    .Marland Kitchenatorvastatin (LIPITOR) 10 MG tablet TAKE 1/2 PILL BY MOUTH EVERY OTHER DAY 24 tablet 1  . Blood Glucose Monitoring Suppl (ACCU-CHEK GUIDE) w/Device KIT To check glucose daily and as needed for type 2 diabetes 1 kit 0  . Cholecalciferol (VITAMIN D3) 25 MCG (1000 UT) CAPS Take 1 capsule by mouth daily.    . empagliflozin (JARDIANCE) 10 MG TABS tablet Take 1 tablet (10 mg total) by mouth daily before breakfast. 90 tablet 1  .  Esomeprazole Magnesium (NEXIUM PO) Take 1 tablet by mouth every other day.    . fluticasone (FLONASE) 50 MCG/ACT nasal spray Place 1 spray into both nostrils daily.    Marland Kitchen glucose blood (ACCU-CHEK GUIDE) test strip To check glucose daily and as needed for type 2 diabetes 100 each 3  . hyoscyamine (LEVSIN SL) 0.125 MG SL tablet Take 1 tablet (0.125 mg total) by mouth every 4 (four) hours as needed. For abdominal cramps 90 tablet 3  . Lancets (ACCU-CHEK MULTICLIX) lancets To check glucose daily and as needed for diabetes type 2 100 each 3  .  metoprolol tartrate (LOPRESSOR) 25 MG tablet Take 1 tablet (25 mg total) by mouth daily. 90 tablet 3  . Multiple Vitamin (MULTIVITAMIN) tablet Take 1 tablet by mouth daily.    . NON FORMULARY SLEEPS WITH BI-PAP    . ondansetron (ZOFRAN ODT) 4 MG disintegrating tablet Take 1 tablet (4 mg total) by mouth every 4 (four) hours as needed for nausea or vomiting. 20 tablet 0  . zolpidem (AMBIEN) 10 MG tablet Take 0.5-1 tablets (5-10 mg total) by mouth at bedtime as needed. for sleep 30 tablet 1  . predniSONE (DELTASONE) 20 MG tablet Take 2 tablets (40 mg total) by mouth daily. For 3 days, then 1 tab daily for 3 days 9 tablet 0   No facility-administered medications prior to visit.   Review of Systems  Review of Systems  Constitutional: Negative.   Respiratory: Negative.   Psychiatric/Behavioral: Negative for sleep disturbance.   Physical Exam  BP 126/74 (BP Location: Left Wrist, Cuff Size: Normal)   Pulse 89   Temp (!) 97.1 F (36.2 C) (Temporal)   Ht 5' 5.5" (1.664 m)   Wt 242 lb 9.6 oz (110 kg)   SpO2 97%   BMI 39.76 kg/m  Physical Exam Constitutional:      Appearance: Normal appearance.  HENT:     Mouth/Throat:     Mouth: Mucous membranes are moist.     Pharynx: Oropharynx is clear.  Cardiovascular:     Rate and Rhythm: Normal rate and regular rhythm.  Pulmonary:     Effort: Pulmonary effort is normal.     Breath sounds: Normal breath sounds.  Musculoskeletal:        General: Normal range of motion.  Skin:    General: Skin is warm and dry.  Neurological:     General: No focal deficit present.     Mental Status: She is alert and oriented to person, place, and time. Mental status is at baseline.  Psychiatric:        Mood and Affect: Mood normal.        Behavior: Behavior normal.        Thought Content: Thought content normal.        Judgment: Judgment normal.      Lab Results:  CBC    Component Value Date/Time   WBC 8.4 05/08/2019 0844   RBC 4.04 05/08/2019 0844    HGB 12.2 05/08/2019 0844   HGB 15.4 01/09/2014 1613   HCT 37.0 05/08/2019 0844   HCT 46.0 01/09/2014 1613   PLT 268.0 05/08/2019 0844   PLT 293 01/09/2014 1613   MCV 91.7 05/08/2019 0844   MCV 94 01/09/2014 1613   MCH 30.4 07/27/2014 0216   MCHC 33.0 05/08/2019 0844   RDW 14.5 05/08/2019 0844   RDW 13.2 01/09/2014 1613   LYMPHSABS 1.9 05/08/2019 0844   LYMPHSABS 2.4 01/09/2014 1613   MONOABS  0.7 05/08/2019 0844   MONOABS 0.6 01/09/2014 1613   EOSABS 0.4 05/08/2019 0844   EOSABS 0.5 01/09/2014 1613   BASOSABS 0.0 05/08/2019 0844   BASOSABS 0.0 01/09/2014 1613    BMET    Component Value Date/Time   NA 136 11/06/2019 0812   NA 137 01/09/2014 1613   K 4.1 11/06/2019 0812   K 4.3 01/09/2014 1613   CL 102 11/06/2019 0812   CL 105 01/09/2014 1613   CO2 25 11/06/2019 0812   CO2 22 01/09/2014 1613   GLUCOSE 210 (H) 11/06/2019 0812   GLUCOSE 142 (H) 01/09/2014 1613   BUN 13 11/06/2019 0812   BUN 13 01/09/2014 1613   CREATININE 1.28 (H) 11/06/2019 0812   CREATININE 1.08 01/09/2014 1613   CALCIUM 9.2 11/06/2019 0812   CALCIUM 9.4 01/09/2014 1613   GFRNONAA 50 (L) 07/28/2014 0653   GFRNONAA 55 (L) 01/09/2014 1613   GFRAA 57 (L) 07/28/2014 0653   GFRAA >60 01/09/2014 1613    BNP No results found for: BNP  ProBNP No results found for: PROBNP  Imaging: DG BONE DENSITY (DXA)  Result Date: 01/13/2020 EXAM: DUAL X-RAY ABSORPTIOMETRY (DXA) FOR BONE MINERAL DENSITY IMPRESSION: Your patient Keanu Frickey completed a BMD test on 01/13/2020 using the [employer] (software version: 14.10) manufactured by [employer]. The following summarizes the results of our evaluation. Technologist: [facility] PATIENT BIOGRAPHICAL: Name: Lazara, Grieser Patient ID: [id number] Birth Date: [birth year] Height: 65.2 in. Gender: Female Exam Date: 01/13/2020 Weight: 243.0 lbs. Indications: Caucasian, Diabetic, High Risk Meds, Hysterectomy, Oophorectomy Bilateral, Postmenopausal,  Ulcerative Colitis Fractures: Treatments: Flonase, Jardiance, Multi-Vitamin, Nexium, Prednisone, Vitamin D DENSITOMETRY RESULTS: Site      Region     Measured Date Measured Age WHO Classification Young Adult T-score BMD         %Change vs. Previous Significant Change (*) AP Spine L2-L4 (L3) 01/13/2020 65.5 Normal 2.3 1.503 g/cm2 - - DualFemur Neck Left 01/13/2020 65.5 Normal -0.2 1.011 g/cm2 - - ASSESSMENT: The BMD measured at Femur Neck Left is 1.011 g/cm2 with a T-score of -0.2. This patient is considered normal according to [facility]) criteria. The scan quality is good. Lumbar spine BMD is considered inaccurate due to degenerative changes. [facility]) criteria for post-menopausal, Caucasian Women: Normal:                   T-score at or above -1 SD Osteopenia/low bone mass: T-score between -1 and -2.5 SD Osteoporosis:             T-score at or below -2.5 SD RECOMMENDATIONS: 1. All patients should optimize calcium and vitamin D intake. 2. Consider FDA-approved medical therapies in postmenopausal women and men aged 51 years and older, based on the following: a. A hip or vertebral(clinical or morphometric) fracture b. T-score < -2.5 at the femoral neck or spine after appropriate evaluation to exclude secondary causes c. Low bone mass (T-score between -1.0 and -2.5 at the femoral neck or spine) and a 10-year probability of a hip fracture > 3% or a 10-year probability of a major osteoporosis-related fracture > 20% based on the [place]-adapted WHO algorithm 3. Clinician judgment and/or patient preferences may indicate treatment for people with 10-year fracture probabilities above or below these levels FOLLOW-UP: People with diagnosed cases of osteoporosis or at high risk for fracture should have regular bone mineral density tests. For patients eligible for Medicare, routine testing is allowed once every 2 years.  The testing frequency can be increased to one year for patients who have  rapidly progressing disease, those who are receiving or discontinuing medical therapy to restore bone mass, or have additional risk factors. Electronically Signed   By: Marlaine Hind M.D.   On: 01/13/2020 09:46     Assessment & Plan:   OSA (obstructive sleep apnea) - HST in 2012 showed severe OSA, AHI 37/hr - She is 100% compliant with BiPAP and reports benefit from use. - Pressure 19/10 cm h20, residual AHI 3.8. - Would like to change DME companies from [facility] to [employer].  Prescription printed and given to patient to renew PAP supplies.  - Uses Ambien 66m very rarely for insomnia - FU in 1 year with Dr. KNeta Mends NP 01/28/2020

## 2020-01-28 NOTE — Assessment & Plan Note (Addendum)
-   HST in 2012 showed severe OSA, AHI 37/hr - She is 100% compliant with BiPAP and reports benefit from use. - Pressure 19/10 cm h20, residual AHI 3.8. - Would like to change DME companies from [facility] to [employer].  Prescription printed and given to patient to renew PAP supplies.  - Uses Ambien 73m very rarely for insomnia - FU in 1 year with Dr. KMortimer Fries

## 2020-02-02 ENCOUNTER — Other Ambulatory Visit: Payer: Medicare PPO

## 2020-02-08 ENCOUNTER — Telehealth: Payer: Self-pay | Admitting: Family Medicine

## 2020-02-08 DIAGNOSIS — I10 Essential (primary) hypertension: Secondary | ICD-10-CM

## 2020-02-08 DIAGNOSIS — E119 Type 2 diabetes mellitus without complications: Secondary | ICD-10-CM

## 2020-02-08 DIAGNOSIS — R7989 Other specified abnormal findings of blood chemistry: Secondary | ICD-10-CM

## 2020-02-08 NOTE — Telephone Encounter (Signed)
-----   Message from Cloyd Stagers, RT sent at 01/30/2020  1:43 PM EST ----- Regarding: Lab Orders for Monday 1.17.2022 Please place lab orders for Monday 1.17.2022, office visit for 3 month f/u on Friday 1.21.2022 Thank you, Dyke Maes RT(R)

## 2020-02-09 ENCOUNTER — Other Ambulatory Visit: Payer: Medicare PPO

## 2020-02-11 ENCOUNTER — Other Ambulatory Visit (INDEPENDENT_AMBULATORY_CARE_PROVIDER_SITE_OTHER): Payer: Medicare PPO

## 2020-02-11 ENCOUNTER — Other Ambulatory Visit: Payer: Self-pay

## 2020-02-11 ENCOUNTER — Other Ambulatory Visit: Payer: Medicare PPO

## 2020-02-11 DIAGNOSIS — R7989 Other specified abnormal findings of blood chemistry: Secondary | ICD-10-CM | POA: Diagnosis not present

## 2020-02-11 DIAGNOSIS — I10 Essential (primary) hypertension: Secondary | ICD-10-CM | POA: Diagnosis not present

## 2020-02-11 DIAGNOSIS — E119 Type 2 diabetes mellitus without complications: Secondary | ICD-10-CM | POA: Diagnosis not present

## 2020-02-12 LAB — BASIC METABOLIC PANEL
BUN: 19 mg/dL (ref 6–23)
CO2: 20 mEq/L (ref 19–32)
Calcium: 9.4 mg/dL (ref 8.4–10.5)
Chloride: 105 mEq/L (ref 96–112)
Creatinine, Ser: 1.66 mg/dL — ABNORMAL HIGH (ref 0.40–1.20)
GFR: 32.14 mL/min — ABNORMAL LOW (ref >60.00)
Glucose, Bld: 216 mg/dL — ABNORMAL HIGH (ref 70–99)
Potassium: 4.4 mEq/L (ref 3.5–5.1)
Sodium: 136 mEq/L (ref 135–145)

## 2020-02-12 LAB — HEMOGLOBIN A1C: Hgb A1c MFr Bld: 8.7 % — ABNORMAL HIGH (ref 4.6–6.5)

## 2020-02-13 ENCOUNTER — Encounter: Payer: Self-pay | Admitting: Family Medicine

## 2020-02-13 ENCOUNTER — Telehealth (INDEPENDENT_AMBULATORY_CARE_PROVIDER_SITE_OTHER): Payer: Medicare PPO | Admitting: Family Medicine

## 2020-02-13 ENCOUNTER — Other Ambulatory Visit: Payer: Self-pay

## 2020-02-13 VITALS — Wt 234.0 lb

## 2020-02-13 DIAGNOSIS — E669 Obesity, unspecified: Secondary | ICD-10-CM

## 2020-02-13 DIAGNOSIS — B3731 Acute candidiasis of vulva and vagina: Secondary | ICD-10-CM

## 2020-02-13 DIAGNOSIS — R7989 Other specified abnormal findings of blood chemistry: Secondary | ICD-10-CM

## 2020-02-13 DIAGNOSIS — B373 Candidiasis of vulva and vagina: Secondary | ICD-10-CM

## 2020-02-13 DIAGNOSIS — E119 Type 2 diabetes mellitus without complications: Secondary | ICD-10-CM

## 2020-02-13 MED ORDER — GLIPIZIDE ER 5 MG PO TB24: 5.0000 mg | ORAL_TABLET | Freq: Every day | ORAL | 0 refills | Status: DC

## 2020-02-13 MED ORDER — FLUCONAZOLE 100 MG PO TABS: 100.0000 mg | ORAL_TABLET | Freq: Once | ORAL | 0 refills | Status: AC

## 2020-02-13 NOTE — Assessment & Plan Note (Signed)
Discussed how this problem influences overall health and the risks it imposes  Reviewed plan for weight loss with lower calorie diet (via better food choices and also portion control or program like weight watchers) and exercise building up to or more than 30 minutes 5 days per week including some aerobic activity   She is ready to get back to [facility] program -liked it when she tried in November  Some emotional eating issues  Off track with diet for the holidays

## 2020-02-13 NOTE — Assessment & Plan Note (Signed)
Unfortunately pt had a course of prednisone in December  Also cr is up to 1.66 on Jardiance Will stop this Lab Results  Component Value Date   HGBA1C 8.7 (H) 02/11/2020   Counseled re: diet- she is ready to return to the [facility] program and likes it  Enc exercise as well  Will try glipizide 5 mg xl- disc imp of glucose monitoring with this to watch for lows Will f/u in 2-3 wk with glucose record and also re check bmet at that time  Enc lots of water

## 2020-02-13 NOTE — Patient Instructions (Signed)
Stop the jardiance and try glipizide  If any side effects or problems let us know  Check glucose several times daily - alert [place] if low (under 70) at any time Get back to healthy eating with the NO[facility] program and try to add exercise 30 minutes daily   Take the diflucan for yeast infection   Follow up in 2-3 weeks and we will do labs then

## 2020-02-13 NOTE — Assessment & Plan Note (Signed)
After course of prednisone  Partially improved with monistat otc  Will tx with diflucan 100 (lower dose due to renal change)  Update if not starting to improve in a week or if worsening

## 2020-02-13 NOTE — Progress Notes (Signed)
Virtual Visit via Video Note  I connected with [name] on 02/13/20 at  9:00 AM EST by a video enabled telemedicine application and verified that I am speaking with the correct person using two identifiers.  Location: Patient: home Provider: office   I discussed the limitations of evaluation and management by telemedicine and the availability of in person appointments. The patient expressed understanding and agreed to proceed.  Parties involved in encounter  Patient: Jamie [name]  Provider:  Loura Pardon MD   History of Present Illness: Pt presents for 3 mo f/u of chronic medical problems   Wt Readings from Last 3 Encounters:  02/13/20 234 lb (106.1 kg)  01/28/20 242 lb 9.6 oz (110 kg)  01/12/20 243 lb (110.2 kg)   Has been more active Not as well on diet  Had a covid exposure within family but she did not get it   Tried NOOM program between TG and xmas -did well and then  Stopped at [employer] to get back on track  Lost 3-4 lb   Saw Dr Silvio Pate for ETD and she took a short course of prednisone  Had a yeast infection after that  Took 3 d monistat (2 wk ago) - not gone yet  She saw ENT yesterday - flushed wax and did a full audiology eval  Some mild hearing loss and will f/u in a year    HTN  BP Readings from Last 3 Encounters:  01/28/20 126/74  01/12/20 128/72  11/13/19 120/68   Taking metoprolol 25 mg daily   DM2 At last visit A1C was up to 9.0 (from 6.7)  Disc diet and wt loss goals /fitness goals   Noted intolerant to metformin so changed to Januvia 100 mg daily (this was not affordable)  So we changed to jardiance 10 mg daily  Lab Results  Component Value Date   HGBA1C 8.7 (H) 02/11/2020   This is improved but not at goal  Glucose 216 at time of lab draw   Pt notes glucose readings are 140s-160s in am (a little improved)  Does not consistently check later  She did take prednisone just before xmas     Not enough effort for diet  Drinking a  lot more water however   Last eye exam    Cr was 1.28 and microalb up  Lab Results  Component Value Date   CREATININE 1.66 (H) 02/11/2020   BUN 19 02/11/2020   NA 136 02/11/2020   K 4.4 02/11/2020   CL 105 02/11/2020   CO2 20 02/11/2020   She did take some aleve right after xmas  Not on   Patient Active Problem List   Diagnosis Date Noted  . Eustachian tube dysfunction, bilateral 01/12/2020  . Urinary frequency 12/23/2019  . Elevated serum creatinine 05/14/2019  . Cellulitis 07/01/2018  . Dermatitis 07/01/2018  . Yeast vaginitis 07/01/2018  . Left shoulder pain 11/30/2017  . Palpitations 07/05/2017  . Ostomy nurse consultation 06/05/2017  . Lichen planus [id number]  . Hyperlipidemia associated with type 2 diabetes mellitus ([place]) 04/10/2016  . Obesity (BMI 30-39.9) 01/08/2015  . Red blood cell antibody positive 07/02/2013  . Elevated C-reactive protein (CRP) 05/15/2012  . Routine general medical examination at a health care facility 09/24/2011  . Apnea 11/14/2010  . Nevus of lower leg 11/14/2010  . Other screening mammogram 10/31/2010  . Stress reaction, emotional 05/02/2010  . Compulsive overeater 05/02/2010  . Arthritis   . H/O small bowel obstruction   .  KNEE PAIN 10/14/2009  . HOARSENESS 07/21/2009  . Diabetes type 2, controlled ([place]) 04/23/2009  . ROSACEA 09/12/2007  . Essential hypertension 05/15/2007  . OSA (obstructive sleep apnea) 12/13/2006  . Ulcerative colitis ([place]) 07/12/2006   Past Medical History:  Diagnosis Date  . Arthritis    generalized  . Bowel obstruction (HCC)    repeated (? possible adhesions) neg EGD  . Colitis    with colectomy  . Gall stones 2008  . Gastroenteritis 07/25/14   hosp for IVF   . HTN (hypertension)   . Hyperglycemia    mild-monitors A1C  . Obesity   . Rosacea   . Sleep apnea    CPAP everynight   Past Surgical History:  Procedure Laterality Date  . CHOLECYSTECTOMY    . COLECTOMY  1993   has ileostomy  .  COLOSTOMY REVISION  12/06/2011   Procedure: COLOSTOMY REVISION;  Surgeon: Leighton Ruff, MD;  Location: [facility];  Service: General;  Laterality: Right;  OSTOMY revision  . EXAMINATION UNDER ANESTHESIA  12/06/2011   Procedure: EXAM UNDER ANESTHESIA;  Surgeon: Leighton Ruff, MD;  Location: [facility];  Service: General;  Laterality: N/A;  rectal exam under anesthesia, anal dilation, pouchoscopy    . TOTAL ABDOMINAL HYSTERECTOMY  05/2006   for abscessed ovaries   Social History   Tobacco Use  . Smoking status: Never Smoker  . Smokeless tobacco: Never Used  Vaping Use  . Vaping Use: Never used  Substance Use Topics  . Alcohol use: No    Alcohol/week: 0.0 standard drinks  . Drug use: No   Family History  Problem Relation Age of Onset  . Cancer Father        colon  . Liver cancer Father        resection secondary to mets  . Hyperlipidemia Father   . Hypertension Father   . Allergies Father   . Ulcerative colitis Father   . Hypertension Mother   . Hyperlipidemia Mother   . Cirrhosis Mother        NASH  . Diabetes Mother   . Allergies Brother   . Gastric cancer Brother   . Breast cancer Paternal Grandmother   . Breast cancer Cousin 40       paternal cousins   Allergies  Allergen Reactions  . Jardiance [Empagliflozin]     Increase in Cr   . Lisinopril     REACTION: felt bad/ stomach hurt/ weak and tired  . Metformin And Related     GI  . Ciprofloxacin Rash    Erythema and pruritis around IV site, erythema and rash along vein  . Sulfa Antibiotics Rash   Current Outpatient Medications on File Prior to Visit  Medication Sig Dispense Refill  . Acetaminophen 325 MG CAPS Take 2 capsules by mouth daily as needed.    Marland Kitchen atorvastatin (LIPITOR) 10 MG tablet TAKE 1/2 PILL BY MOUTH EVERY OTHER DAY 24 tablet 1  . Blood Glucose Monitoring Suppl (ACCU-CHEK GUIDE) w/Device KIT To check glucose daily and as needed for type 2 diabetes 1 kit 0  .  Cholecalciferol (VITAMIN D3) 25 MCG (1000 UT) CAPS Take 1 capsule by mouth daily.    . Esomeprazole Magnesium (NEXIUM PO) Take 1 tablet by mouth every other day.    . fluticasone (FLONASE) 50 MCG/ACT nasal spray Place 1 spray into both nostrils daily.    Marland Kitchen glucose blood (ACCU-CHEK GUIDE) test strip To check glucose daily and as needed  for type 2 diabetes 100 each 3  . hyoscyamine (LEVSIN SL) 0.125 MG SL tablet Take 1 tablet (0.125 mg total) by mouth every 4 (four) hours as needed. For abdominal cramps 90 tablet 3  . Lancets (ACCU-CHEK MULTICLIX) lancets To check glucose daily and as needed for diabetes type 2 100 each 3  . metoprolol tartrate (LOPRESSOR) 25 MG tablet Take 1 tablet (25 mg total) by mouth daily. 90 tablet 3  . Multiple Vitamin (MULTIVITAMIN) tablet Take 1 tablet by mouth daily.    . NON FORMULARY SLEEPS WITH BI-PAP    . ondansetron (ZOFRAN ODT) 4 MG disintegrating tablet Take 1 tablet (4 mg total) by mouth every 4 (four) hours as needed for nausea or vomiting. 20 tablet 0  . zolpidem (AMBIEN) 10 MG tablet Take 0.5-1 tablets (5-10 mg total) by mouth at bedtime as needed. for sleep 30 tablet 1   No current facility-administered medications on file prior to visit.    Observations/Objective: Patient appears well, in no distress Weight is baseline  No facial swelling or asymmetry Normal voice-not hoarse and no slurred speech No obvious tremor or mobility impairment Moving neck and UEs normally Able to hear the call well  No cough or shortness of breath during interview  Talkative and mentally sharp with no cognitive changes No skin changes on face or neck , no rash or pallor Affect is normal    Assessment and Plan: Problem List Items Addressed This Visit      Endocrine   Diabetes type 2, controlled ([place]) - Primary    Unfortunately pt had a course of prednisone in December  Also cr is up to 1.66 on Jardiance Will stop this Lab Results  Component Value Date   HGBA1C 8.7  (H) 02/11/2020   Counseled re: diet- she is ready to return to the [place] program and likes it  Enc exercise as well  Will try glipizide 5 mg xl- disc imp of glucose monitoring with this to watch for lows Will f/u in 2-3 wk with glucose record and also re check bmet at that time  Enc lots of water       Relevant Medications   glipiZIDE (GLUCOTROL XL) 5 MG 24 hr tablet     Genitourinary   Yeast vaginitis    After course of prednisone  Partially improved with monistat otc  Will tx with diflucan 100 (lower dose due to renal change)  Update if not starting to improve in a week or if worsening        Relevant Medications   fluconazole (DIFLUCAN) 100 MG tablet     Other   Obesity (BMI 30-39.9)    Discussed how this problem influences overall health and the risks it imposes  Reviewed plan for weight loss with lower calorie diet (via better food choices and also portion control or program like weight watchers) and exercise building up to or more than 30 minutes 5 days per week including some aerobic activity   She is ready to get back to NOOM program -liked it when she tried in November  Some emotional eating issues  Off track with diet for the holidays       Relevant Medications   glipiZIDE (GLUCOTROL XL) 5 MG 24 hr tablet   Elevated serum creatinine       Follow Up Instructions: Stop the jardiance and try glipizide  If any side effects or problems let us know  Check glucose several times daily - alert [place]  if low (under 70) at any time Get back to healthy eating with the NOOM program and try to add exercise 30 minutes daily   Take the diflucan for yeast infection   Follow up in 2-3 weeks and we will do labs then   I discussed the assessment and treatment plan with the patient. The patient was provided an opportunity to ask questions and all were answered. The patient agreed with the plan and demonstrated an understanding of the instructions.   The patient was advised to call  back or seek an in-person evaluation if the symptoms worsen or if the condition fails to improve as anticipated.     Loura Pardon, MD

## 2020-02-19 ENCOUNTER — Encounter: Payer: Self-pay | Admitting: Family Medicine

## 2020-02-19 MED ORDER — GLIPIZIDE 5 MG PO TABS: 5.0000 mg | ORAL_TABLET | Freq: Two times a day (BID) | ORAL | 3 refills | Status: DC

## 2020-02-27 ENCOUNTER — Other Ambulatory Visit: Payer: Self-pay

## 2020-03-01 ENCOUNTER — Other Ambulatory Visit: Payer: Self-pay

## 2020-03-01 ENCOUNTER — Encounter: Payer: Self-pay | Admitting: Family Medicine

## 2020-03-01 ENCOUNTER — Ambulatory Visit: Payer: Medicare PPO | Admitting: Family Medicine

## 2020-03-01 VITALS — BP 138/62 | HR 86 | Temp 95.6°F | Resp 20 | Ht 65.5 in | Wt 240.0 lb

## 2020-03-01 DIAGNOSIS — I10 Essential (primary) hypertension: Secondary | ICD-10-CM

## 2020-03-01 DIAGNOSIS — I700 Atherosclerosis of aorta: Secondary | ICD-10-CM | POA: Diagnosis not present

## 2020-03-01 DIAGNOSIS — E669 Obesity, unspecified: Secondary | ICD-10-CM | POA: Diagnosis not present

## 2020-03-01 DIAGNOSIS — E1169 Type 2 diabetes mellitus with other specified complication: Secondary | ICD-10-CM

## 2020-03-01 DIAGNOSIS — E119 Type 2 diabetes mellitus without complications: Secondary | ICD-10-CM

## 2020-03-01 DIAGNOSIS — R7989 Other specified abnormal findings of blood chemistry: Secondary | ICD-10-CM

## 2020-03-01 DIAGNOSIS — E785 Hyperlipidemia, unspecified: Secondary | ICD-10-CM

## 2020-03-01 DIAGNOSIS — K51019 Ulcerative (chronic) pancolitis with unspecified complications: Secondary | ICD-10-CM

## 2020-03-01 LAB — BASIC METABOLIC PANEL
BUN: 17 mg/dL (ref 6–23)
CO2: 21 mEq/L (ref 19–32)
Calcium: 9.5 mg/dL (ref 8.4–10.5)
Chloride: 106 mEq/L (ref 96–112)
Creatinine, Ser: 1.26 mg/dL — ABNORMAL HIGH (ref 0.40–1.20)
GFR: 44.73 mL/min — ABNORMAL LOW (ref >60.00)
Glucose, Bld: 227 mg/dL — ABNORMAL HIGH (ref 70–99)
Potassium: 4.4 mEq/L (ref 3.5–5.1)
Sodium: 137 mEq/L (ref 135–145)

## 2020-03-01 NOTE — Assessment & Plan Note (Signed)
Incidental on imaging No symptoms  Disc imp of BP and cholesterol control  Continue statin

## 2020-03-01 NOTE — Progress Notes (Signed)
Subjective:    Patient ID: Jamie [name], female    DOB: Mar 02, 1955, 65 y.o.   MRN: [id number]  This visit occurred during the SARS-CoV-2 public health emergency.  Safety protocols were in place, including screening questions prior to the visit, additional usage of staff PPE, and extensive cleaning of exam room while observing appropriate contact time as indicated for disinfecting solutions.    HPI Pt presents for f/u of DM2  Wt Readings from Last 3 Encounters:  03/01/20 240 lb (108.9 kg)  02/13/20 234 lb (106.1 kg)  01/28/20 242 lb 9.6 oz (110 kg)  wt is up  39.33 kg/m  Lab Results  Component Value Date   HGBA1C 8.7 (H) 02/11/2020   Last visit noted took prednisone in dec  Cr went up to 1.66 on jardiance  This was stopped Started glipizide xl 5 mg   She noticed much of the pill coming through in ostomy bad So we changed to fast acting   Here to rev gluc records  Improved  Am 100-157 Pre dinner 120s-140s  Bedtime 140s-180   Some stomach ache - dull ache-high in abdomen  Takes medicine 30 min before food   No hypoglycemia at all   Working on making good choices for eating  More vegetables  More active in the past few weeks  Would like to add more   Out of shape - poor exercise tolerance  Lab Results  Component Value Date   CREATININE 1.26 (H) 03/01/2020   BUN 17 03/01/2020   NA 137 03/01/2020   K 4.4 03/01/2020   CL 106 03/01/2020   CO2 21 03/01/2020   No nsaids More water  Stopped jardiance   HTN BP Readings from Last 3 Encounters:  03/01/20 138/62  01/28/20 126/74  01/12/20 128/72   Continues metoprolol 25 mg daily  Intolerant of ace   Hyperlipidemia Lab Results  Component Value Date   CHOL 132 11/06/2019   HDL 43.40 11/06/2019   LDLCALC 49 06/27/2017   LDLDIRECT 62.0 11/06/2019   TRIG 246.0 (H) 11/06/2019   CHOLHDL 3 11/06/2019  plan to continue atorvastatin 10 mg daily  Rev low sat fat diet    Patient Active Problem List    Diagnosis Date Noted  . Aortic atherosclerosis ([place]) 03/01/2020  . Eustachian tube dysfunction, bilateral 01/12/2020  . Urinary frequency 12/23/2019  . Elevated serum creatinine 05/14/2019  . Cellulitis 07/01/2018  . Dermatitis 07/01/2018  . Yeast vaginitis 07/01/2018  . Left shoulder pain 11/30/2017  . Palpitations 07/05/2017  . Ostomy nurse consultation 06/05/2017  . Lichen planus [id number]  . Hyperlipidemia associated with type 2 diabetes mellitus ([place]) 04/10/2016  . Obesity (BMI 30-39.9) 01/08/2015  . Red blood cell antibody positive 07/02/2013  . Elevated C-reactive protein (CRP) 05/15/2012  . Routine general medical examination at a health care facility 09/24/2011  . Apnea 11/14/2010  . Nevus of lower leg 11/14/2010  . Other screening mammogram 10/31/2010  . Stress reaction, emotional 05/02/2010  . Compulsive overeater 05/02/2010  . Arthritis   . H/O small bowel obstruction   . KNEE PAIN 10/14/2009  . HOARSENESS 07/21/2009  . Diabetes type 2, controlled ([place]) 04/23/2009  . ROSACEA 09/12/2007  . Essential hypertension 05/15/2007  . OSA (obstructive sleep apnea) 12/13/2006  . Ulcerative colitis ([place]) 07/12/2006   Past Medical History:  Diagnosis Date  . Arthritis    generalized  . Bowel obstruction (HCC)    repeated (? possible adhesions) neg EGD  . Colitis  with colectomy  . Gall stones 2008  . Gastroenteritis 07/25/14   hosp for IVF   . HTN (hypertension)   . Hyperglycemia    mild-monitors A1C  . Obesity   . Rosacea   . Sleep apnea    CPAP everynight   Past Surgical History:  Procedure Laterality Date  . CHOLECYSTECTOMY    . COLECTOMY  1993   has ileostomy  . COLOSTOMY REVISION  12/06/2011   Procedure: COLOSTOMY REVISION;  Surgeon: Leighton Ruff, MD;  Location: [facility];  Service: General;  Laterality: Right;  OSTOMY revision  . EXAMINATION UNDER ANESTHESIA  12/06/2011   Procedure: EXAM UNDER ANESTHESIA;  Surgeon: Leighton Ruff, MD;   Location: [facility];  Service: General;  Laterality: N/A;  rectal exam under anesthesia, anal dilation, pouchoscopy    . TOTAL ABDOMINAL HYSTERECTOMY  05/2006   for abscessed ovaries   Social History   Tobacco Use  . Smoking status: Never Smoker  . Smokeless tobacco: Never Used  Vaping Use  . Vaping Use: Never used  Substance Use Topics  . Alcohol use: No    Alcohol/week: 0.0 standard drinks  . Drug use: No   Family History  Problem Relation Age of Onset  . Cancer Father        colon  . Liver cancer Father        resection secondary to mets  . Hyperlipidemia Father   . Hypertension Father   . Allergies Father   . Ulcerative colitis Father   . Hypertension Mother   . Hyperlipidemia Mother   . Cirrhosis Mother        NASH  . Diabetes Mother   . Allergies Brother   . Gastric cancer Brother   . Breast cancer Paternal Grandmother   . Breast cancer Cousin 44       paternal cousins   Allergies  Allergen Reactions  . Jardiance [Empagliflozin]     Increase in Cr   . Lisinopril     REACTION: felt bad/ stomach hurt/ weak and tired  . Metformin And Related     GI  . Ciprofloxacin Rash    Erythema and pruritis around IV site, erythema and rash along vein  . Sulfa Antibiotics Rash   Current Outpatient Medications on File Prior to Visit  Medication Sig Dispense Refill  . Acetaminophen 325 MG CAPS Take 2 capsules by mouth daily as needed.    Marland Kitchen atorvastatin (LIPITOR) 10 MG tablet TAKE 1/2 PILL BY MOUTH EVERY OTHER DAY 24 tablet 1  . Blood Glucose Monitoring Suppl (ACCU-CHEK GUIDE) w/Device KIT To check glucose daily and as needed for type 2 diabetes 1 kit 0  . Cholecalciferol (VITAMIN D3) 25 MCG (1000 UT) CAPS Take 1 capsule by mouth daily.    . Esomeprazole Magnesium (NEXIUM PO) Take 1 tablet by mouth every other day.    . fluticasone (FLONASE) 50 MCG/ACT nasal spray Place 1 spray into both nostrils daily.    Marland Kitchen glipiZIDE (GLUCOTROL) 5 MG tablet Take 1 tablet  (5 mg total) by mouth 2 (two) times daily before a meal. 180 tablet 3  . glucose blood (ACCU-CHEK GUIDE) test strip To check glucose daily and as needed for type 2 diabetes 100 each 3  . Lancets (ACCU-CHEK MULTICLIX) lancets To check glucose daily and as needed for diabetes type 2 100 each 3  . metoprolol tartrate (LOPRESSOR) 25 MG tablet Take 1 tablet (25 mg total) by mouth daily. 90 tablet 3  .  Multiple Vitamin (MULTIVITAMIN) tablet Take 1 tablet by mouth daily.    . NON FORMULARY SLEEPS WITH BI-PAP    . zolpidem (AMBIEN) 10 MG tablet Take 0.5-1 tablets (5-10 mg total) by mouth at bedtime as needed. for sleep 30 tablet 1  . hyoscyamine (LEVSIN SL) 0.125 MG SL tablet Take 1 tablet (0.125 mg total) by mouth every 4 (four) hours as needed. For abdominal cramps (Patient not taking: Reported on 03/01/2020) 90 tablet 3  . ondansetron (ZOFRAN ODT) 4 MG disintegrating tablet Take 1 tablet (4 mg total) by mouth every 4 (four) hours as needed for nausea or vomiting. (Patient not taking: Reported on 03/01/2020) 20 tablet 0   No current facility-administered medications on file prior to visit.    Review of Systems Review of Systems  Constitutional: Negative for chills, fever and malaise/fatigue.       Fatigue   HENT: Negative for congestion, ear pain, sinus pain and sore throat.   Eyes: Negative for blurred vision, discharge and redness.  Respiratory: Negative for cough, shortness of breath and stridor.   Cardiovascular: Negative for chest pain, palpitations and leg swelling.  Gastrointestinal: Negative for abdominal pain, diarrhea, nausea and vomiting.  Musculoskeletal: Negative for myalgias.  Skin: Negative for rash.  Neurological: Negative for dizziness and headaches.  Endo/Heme/Allergies: Negative for polydipsia.       Objective:   Physical Exam  Physical Exam Constitutional:      General: She is not in acute distress.    Appearance: Normal appearance. She is obese. She is not ill-appearing.   HENT:     Head: Normocephalic and atraumatic.  Eyes:     General: No scleral icterus.    Conjunctiva/sclera: Conjunctivae normal.     Pupils: Pupils are equal, round, and reactive to light.  Neck:     Vascular: No carotid bruit.  Cardiovascular:     Rate and Rhythm: Normal rate and regular rhythm.     Pulses: Normal pulses.     Heart sounds: Normal heart sounds. No murmur heard.   Pulmonary:     Effort: Pulmonary effort is normal. No respiratory distress.     Breath sounds: Normal breath sounds. No wheezing or rales.  Abdominal:     General: Abdomen is flat. Bowel sounds are normal.  Musculoskeletal:     Cervical back: Normal range of motion and neck supple. No rigidity or tenderness.     Right lower leg: No edema.     Left lower leg: No edema.  Lymphadenopathy:     Cervical: No cervical adenopathy.  Skin:    Coloration: Skin is not pale.     Findings: No erythema or rash.  Neurological:     Mental Status: She is alert.     Sensory: No sensory deficit.     Coordination: Coordination normal.     Deep Tendon Reflexes: Reflexes normal.  Psychiatric:        Mood and Affect: Mood normal.          Assessment & Plan:   Problem List Items Addressed This Visit      Cardiovascular and Mediastinum   Essential hypertension    bp in fair control at this time  BP Readings from Last 1 Encounters:  03/01/20 138/62   No changes needed Most recent labs reviewed  Disc lifstyle change with low sodium diet and exercise  Plan to continue metoprolol 25 mg daily  Intolerant of ace        Aortic atherosclerosis ([place])  Incidental on imaging No symptoms  Disc imp of BP and cholesterol control  Continue statin         Digestive   Ulcerative colitis ([place])    No clinical changes  No problems with ostomy        Endocrine   Diabetes type 2, controlled ([place]) - Primary    Lab Results  Component Value Date   HGBA1C 8.7 (H) 02/11/2020    Diet is very slowly improving   Stopped metformin -GI side eff Stopped jardiance due to renal insuff  Now on glipizide 5 mg bid (XL did not work out with ostomy)  Now glucose readings are starting to improve Encouraged low glycemic diet and gradual increase in activity       Relevant Orders   Basic metabolic panel (Completed)   Hyperlipidemia associated with type 2 diabetes mellitus ([place])    Disc goals for lipids and reasons to control them Rev last labs with pt Rev low sat fat diet in detail Controlled with atorvastatin 10 mg and diet         Other   Obesity (BMI 30-39.9)    Discussed how this problem influences overall health and the risks it imposes  Reviewed plan for weight loss with lower calorie diet (via better food choices and also portion control or program like weight watchers) and exercise building up to or more than 30 minutes 5 days per week including some aerobic activity   Disc plan for very gradual inc in exercise       Elevated serum creatinine    Now off metformin and jardiance bp well controlled  Has increased fluids Lab today      Relevant Orders   Basic metabolic panel (Completed)

## 2020-03-01 NOTE — Assessment & Plan Note (Signed)
bp in fair control at this time  BP Readings from Last 1 Encounters:  03/01/20 138/62   No changes needed Most recent labs reviewed  Disc lifstyle change with low sodium diet and exercise  Plan to continue metoprolol 25 mg daily  Intolerant of ace

## 2020-03-01 NOTE — Assessment & Plan Note (Signed)
Now off metformin and jardiance bp well controlled  Has increased fluids Lab today

## 2020-03-01 NOTE — Assessment & Plan Note (Signed)
Discussed how this problem influences overall health and the risks it imposes  Reviewed plan for weight loss with lower calorie diet (via better food choices and also portion control or program like weight watchers) and exercise building up to or more than 30 minutes 5 days per week including some aerobic activity   Disc plan for very gradual inc in exercise

## 2020-03-01 NOTE — Assessment & Plan Note (Signed)
No clinical changes  No problems with ostomy

## 2020-03-01 NOTE — Assessment & Plan Note (Signed)
Lab Results  Component Value Date   HGBA1C 8.7 (H) 02/11/2020    Diet is very slowly improving  Stopped metformin -GI side eff Stopped jardiance due to renal insuff  Now on glipizide 5 mg bid (XL did not work out with ostomy)  Now glucose readings are starting to improve Encouraged low glycemic diet and gradual increase in activity

## 2020-03-01 NOTE — Assessment & Plan Note (Signed)
Disc goals for lipids and reasons to control them Rev last labs with pt Rev low sat fat diet in detail Controlled with atorvastatin 10 mg and diet

## 2020-03-01 NOTE — Patient Instructions (Addendum)
Take your glipizide with food from now on  Alert me if the stomach ache does not improve   Keep working on diet and exercise  Continue the glipizide 5 mg twice daily  Keep drinking water   Follow up in 3 months

## 2020-03-11 ENCOUNTER — Encounter: Payer: Self-pay | Admitting: Family Medicine

## 2020-05-05 ENCOUNTER — Other Ambulatory Visit: Payer: Self-pay | Admitting: Family Medicine

## 2020-05-13 ENCOUNTER — Encounter: Payer: Self-pay | Admitting: Family Medicine

## 2020-05-26 ENCOUNTER — Telehealth: Payer: Self-pay | Admitting: Family Medicine

## 2020-05-26 DIAGNOSIS — E785 Hyperlipidemia, unspecified: Secondary | ICD-10-CM

## 2020-05-26 DIAGNOSIS — I10 Essential (primary) hypertension: Secondary | ICD-10-CM

## 2020-05-26 DIAGNOSIS — E1169 Type 2 diabetes mellitus with other specified complication: Secondary | ICD-10-CM

## 2020-05-26 DIAGNOSIS — E119 Type 2 diabetes mellitus without complications: Secondary | ICD-10-CM

## 2020-05-26 NOTE — Telephone Encounter (Signed)
-----   Message from Cloyd Stagers, RT sent at 05/18/2020  1:38 PM EDT ----- Regarding: Lab Orders for Thursday 5.5.2022 Please place lab orders for Thursday 5.5.2022, appt notes state "fasting labs per Dr Glori Bickers" Thank you, Dyke Maes RT(R)

## 2020-05-27 ENCOUNTER — Other Ambulatory Visit: Payer: Self-pay

## 2020-05-27 ENCOUNTER — Other Ambulatory Visit (INDEPENDENT_AMBULATORY_CARE_PROVIDER_SITE_OTHER): Payer: Medicare PPO

## 2020-05-27 DIAGNOSIS — I10 Essential (primary) hypertension: Secondary | ICD-10-CM | POA: Diagnosis not present

## 2020-05-27 DIAGNOSIS — E1169 Type 2 diabetes mellitus with other specified complication: Secondary | ICD-10-CM | POA: Diagnosis not present

## 2020-05-27 DIAGNOSIS — E785 Hyperlipidemia, unspecified: Secondary | ICD-10-CM | POA: Diagnosis not present

## 2020-05-27 DIAGNOSIS — E119 Type 2 diabetes mellitus without complications: Secondary | ICD-10-CM

## 2020-05-27 LAB — COMPREHENSIVE METABOLIC PANEL
ALT: 16 U/L (ref 0–35)
AST: 22 U/L (ref 0–37)
Albumin: 3.7 g/dL (ref 3.5–5.2)
Alkaline Phosphatase: 68 U/L (ref 39–117)
BUN: 14 mg/dL (ref 6–23)
CO2: 25 mEq/L (ref 19–32)
Calcium: 9.1 mg/dL (ref 8.4–10.5)
Chloride: 105 mEq/L (ref 96–112)
Creatinine, Ser: 1.23 mg/dL — ABNORMAL HIGH (ref 0.40–1.20)
GFR: 45.97 mL/min — ABNORMAL LOW (ref >60.00)
Glucose, Bld: 113 mg/dL — ABNORMAL HIGH (ref 70–99)
Potassium: 3.9 mEq/L (ref 3.5–5.1)
Sodium: 140 mEq/L (ref 135–145)
Total Bilirubin: 0.8 mg/dL (ref 0.2–1.2)
Total Protein: 6.9 g/dL (ref 6.0–8.3)

## 2020-05-27 LAB — LIPID PANEL
Cholesterol: 132 mg/dL (ref 0–200)
HDL: 52.3 mg/dL (ref >39.00)
LDL Cholesterol: 45 mg/dL (ref 0–99)
NonHDL: 80.1
Total CHOL/HDL Ratio: 3
Triglycerides: 174 mg/dL — ABNORMAL HIGH (ref 0.0–149.0)
VLDL: 34.8 mg/dL (ref 0.0–40.0)

## 2020-05-27 LAB — HEMOGLOBIN A1C: Hgb A1c MFr Bld: 7.4 % — ABNORMAL HIGH (ref 4.6–6.5)

## 2020-05-31 ENCOUNTER — Ambulatory Visit: Payer: Medicare PPO | Admitting: Family Medicine

## 2020-05-31 ENCOUNTER — Encounter: Payer: Self-pay | Admitting: Family Medicine

## 2020-05-31 ENCOUNTER — Other Ambulatory Visit: Payer: Self-pay | Admitting: Family Medicine

## 2020-05-31 ENCOUNTER — Other Ambulatory Visit: Payer: Self-pay

## 2020-05-31 VITALS — BP 126/68 | HR 74 | Temp 97.0°F | Ht 65.5 in | Wt 241.1 lb

## 2020-05-31 DIAGNOSIS — N1831 Chronic kidney disease, stage 3a: Secondary | ICD-10-CM

## 2020-05-31 DIAGNOSIS — E1169 Type 2 diabetes mellitus with other specified complication: Secondary | ICD-10-CM

## 2020-05-31 DIAGNOSIS — E119 Type 2 diabetes mellitus without complications: Secondary | ICD-10-CM

## 2020-05-31 DIAGNOSIS — E669 Obesity, unspecified: Secondary | ICD-10-CM

## 2020-05-31 DIAGNOSIS — I10 Essential (primary) hypertension: Secondary | ICD-10-CM

## 2020-05-31 DIAGNOSIS — N183 Chronic kidney disease, stage 3 unspecified: Secondary | ICD-10-CM | POA: Insufficient documentation

## 2020-05-31 DIAGNOSIS — E785 Hyperlipidemia, unspecified: Secondary | ICD-10-CM

## 2020-05-31 DIAGNOSIS — N184 Chronic kidney disease, stage 4 (severe): Secondary | ICD-10-CM | POA: Insufficient documentation

## 2020-05-31 MED ORDER — GLIPIZIDE 5 MG PO TABS: ORAL_TABLET | ORAL | 0 refills | Status: DC

## 2020-05-31 NOTE — Progress Notes (Signed)
Subjective:    Patient ID: Jamie [name], female    DOB: [birth year], 65 y.o.   MRN: [id number]  This visit occurred during the SARS-CoV-2 public health emergency.  Safety protocols were in place, including screening questions prior to the visit, additional usage of staff PPE, and extensive cleaning of exam room while observing appropriate contact time as indicated for disinfecting solutions.    HPI Pt presents for 3 month follow up for diabetes and other chronic health problems  Wt Readings from Last 3 Encounters:  05/31/20 241 lb 1 oz (109.3 kg)  03/01/20 240 lb (108.9 kg)  02/13/20 234 lb (106.1 kg)   39.50 kg/m Eating better Tries to be more active    HTN bp is stable today  No cp or palpitations or headaches or edema  No side effects to medicines  BP Readings from Last 3 Encounters:  05/31/20 126/68  03/01/20 138/62  01/28/20 126/74     Takes metoprolol 25 mg daily   Lab Results  Component Value Date   CREATININE 1.23 (H) 05/27/2020   BUN 14 05/27/2020   NA 140 05/27/2020   K 3.9 05/27/2020   CL 105 05/27/2020   CO2 25 05/27/2020  GFR is 45.9 and stable from Feb (after improvement from 32.1 in January)  Drinking more fluids   DM2 Lab Results  Component Value Date   HGBA1C 7.4 (H) 05/27/2020   this is down from 8.7 in January   Prev on metformin but stopped due to GI side effects Also stopped jardiance to due renal insuff  She went to [facility] for stomach pain - dx with small bowel bact overgrowth  Taking IB guard (has peppermint oil) 1 cap per day and it has helped a lot  This made pain much better  With ostomy/no outlet for gas   It tolerating healthier food and she makes a good effort  Also eating less  Keeping food on board   Now glipizide 5 mg once daily in the am , and her fasting glucose levels have been at or under 100  Later in day, generally 160,170s (bedtime)  No low glucose  No ace-intolerant  Lab Results  Component Value Date    MICROALBUR 10.5 (H) 05/08/2019   MICROALBUR 2.1 (H) 01/30/2018      Statin- atorvastatin   Hyperlipidemia Lab Results  Component Value Date   CHOL 132 05/27/2020   CHOL 132 11/06/2019   CHOL 154 05/08/2019   Lab Results  Component Value Date   HDL 52.30 05/27/2020   HDL 43.40 11/06/2019   HDL 46.50 05/08/2019   Lab Results  Component Value Date   LDLCALC 45 05/27/2020   LDLCALC 49 06/27/2017   Cyril 76 01/05/2015   Lab Results  Component Value Date   TRIG 174.0 (H) 05/27/2020   TRIG 246.0 (H) 11/06/2019   TRIG 319.0 (H) 05/08/2019   Lab Results  Component Value Date   CHOLHDL 3 05/27/2020   CHOLHDL 3 11/06/2019   CHOLHDL 3 05/08/2019   Lab Results  Component Value Date   LDLDIRECT 62.0 11/06/2019   LDLDIRECT 64.0 05/08/2019   LDLDIRECT 62.0 01/30/2018   Taking atorvastatin 10 mg daily  LDL down to 45 and trig are improved  Some stress  Family illness  Brother had stomach cancer   Patient Active Problem List   Diagnosis Date Noted  . Chronic kidney disease (CKD) stage G3a/A1, moderately decreased glomerular filtration rate (GFR) between 45-59 mL/min/1.73 square meter and albuminuria  creatinine ratio less than 30 mg/g ([place]) 05/31/2020  . Aortic atherosclerosis ([place]) 03/01/2020  . Eustachian tube dysfunction, bilateral 01/12/2020  . Urinary frequency 12/23/2019  . Elevated serum creatinine 05/14/2019  . Dermatitis 07/01/2018  . Left shoulder pain 11/30/2017  . Palpitations 07/05/2017  . Ostomy nurse consultation 06/05/2017  . Lichen planus 40/98/1191  . Hyperlipidemia associated with type 2 diabetes mellitus ([place]) 04/10/2016  . Obesity (BMI 30-39.9) 01/08/2015  . Red blood cell antibody positive 07/02/2013  . Elevated C-reactive protein (CRP) 05/15/2012  . Routine general medical examination at a health care facility 09/24/2011  . Apnea 11/14/2010  . Nevus of lower leg 11/14/2010  . Other screening mammogram 10/31/2010  . Stress reaction,  emotional 05/02/2010  . Compulsive overeater 05/02/2010  . Arthritis   . H/O small bowel obstruction   . KNEE PAIN 10/14/2009  . HOARSENESS 07/21/2009  . Diabetes type 2, controlled ([place]) 04/23/2009  . ROSACEA 09/12/2007  . Essential hypertension 05/15/2007  . OSA (obstructive sleep apnea) 12/13/2006  . Ulcerative colitis ([place]) 07/12/2006   Past Medical History:  Diagnosis Date  . Arthritis    generalized  . Bowel obstruction (HCC)    repeated (? possible adhesions) neg EGD  . Colitis    with colectomy  . Gall stones 2008  . Gastroenteritis 07/25/14   hosp for IVF   . HTN (hypertension)   . Hyperglycemia    mild-monitors A1C  . Obesity   . Rosacea   . Sleep apnea    CPAP everynight   Past Surgical History:  Procedure Laterality Date  . CHOLECYSTECTOMY    . COLECTOMY  1993   has ileostomy  . COLOSTOMY REVISION  12/06/2011   Procedure: COLOSTOMY REVISION;  Surgeon: Leighton Ruff, MD;  [facility];  Service: General;  Laterality: Right;  OSTOMY revision  . EXAMINATION UNDER ANESTHESIA  12/06/2011   Procedure: EXAM UNDER ANESTHESIA;  Surgeon: Leighton Ruff, MD;  Location: [facility];  Service: General;  Laterality: N/A;  rectal exam under anesthesia, anal dilation, pouchoscopy    . TOTAL ABDOMINAL HYSTERECTOMY  05/2006   for abscessed ovaries   Social History   Tobacco Use  . Smoking status: Never Smoker  . Smokeless tobacco: Never Used  Vaping Use  . Vaping Use: Never used  Substance Use Topics  . Alcohol use: No    Alcohol/week: 0.0 standard drinks  . Drug use: No   Family History  Problem Relation Age of Onset  . Cancer Father        colon  . Liver cancer Father        resection secondary to mets  . Hyperlipidemia Father   . Hypertension Father   . Allergies Father   . Ulcerative colitis Father   . Hypertension Mother   . Hyperlipidemia Mother   . Cirrhosis Mother        NASH  . Diabetes Mother   . Allergies  Brother   . Gastric cancer Brother   . Breast cancer Paternal Grandmother   . Breast cancer Cousin 84       paternal cousins   Allergies  Allergen Reactions  . Jardiance [Empagliflozin]     Increase in Cr   . Lisinopril     REACTION: felt bad/ stomach hurt/ weak and tired  . Metformin And Related     GI  . Ciprofloxacin Rash    Erythema and pruritis around IV site, erythema and rash along vein  .  Sulfa Antibiotics Rash   Current Outpatient Medications on File Prior to Visit  Medication Sig Dispense Refill  . Acetaminophen 325 MG CAPS Take 2 capsules by mouth daily as needed.    Marland Kitchen atorvastatin (LIPITOR) 10 MG tablet TAKE 1/2 PILL BY MOUTH EVERY OTHER DAY 24 tablet 1  . Blood Glucose Monitoring Suppl (ACCU-CHEK GUIDE) w/Device KIT To check glucose daily and as needed for type 2 diabetes 1 kit 0  . Cholecalciferol (VITAMIN D3) 25 MCG (1000 UT) CAPS Take 1 capsule by mouth daily.    . Esomeprazole Magnesium (NEXIUM PO) Take 1 tablet by mouth every other day.    . fluticasone (FLONASE) 50 MCG/ACT nasal spray Place 1 spray into both nostrils daily.    Marland Kitchen glucose blood (ACCU-CHEK GUIDE) test strip To check glucose daily and as needed for type 2 diabetes 100 each 3  . hyoscyamine (LEVSIN SL) 0.125 MG SL tablet Take 1 tablet (0.125 mg total) by mouth every 4 (four) hours as needed. For abdominal cramps 90 tablet 3  . Lancets (ACCU-CHEK MULTICLIX) lancets To check glucose daily and as needed for diabetes type 2 100 each 3  . metoprolol tartrate (LOPRESSOR) 25 MG tablet Take 1 tablet (25 mg total) by mouth daily. 90 tablet 3  . Multiple Vitamin (MULTIVITAMIN) tablet Take 1 tablet by mouth daily.    . NON FORMULARY SLEEPS WITH BI-PAP    . ondansetron (ZOFRAN ODT) 4 MG disintegrating tablet Take 1 tablet (4 mg total) by mouth every 4 (four) hours as needed for nausea or vomiting. 20 tablet 0  . Peppermint Oil (IBGARD PO) Take 1 capsule by mouth daily.    Marland Kitchen zolpidem (AMBIEN) 10 MG tablet Take  0.5-1 tablets (5-10 mg total) by mouth at bedtime as needed. for sleep 30 tablet 1   No current facility-administered medications on file prior to visit.    Review of Systems  Constitutional: Negative for activity change, appetite change, fatigue, fever and unexpected weight change.  HENT: Negative for congestion, ear pain, rhinorrhea, sinus pressure and sore throat.   Eyes: Negative for pain, redness and visual disturbance.  Respiratory: Negative for cough, shortness of breath and wheezing.   Cardiovascular: Negative for chest pain and palpitations.  Gastrointestinal: Negative for abdominal pain, blood in stool, constipation and diarrhea.  Endocrine: Negative for polydipsia and polyuria.  Genitourinary: Negative for dysuria, frequency and urgency.  Musculoskeletal: Negative for arthralgias, back pain and myalgias.  Skin: Negative for pallor and rash.  Allergic/Immunologic: Negative for environmental allergies.  Neurological: Negative for dizziness, syncope and headaches.  Hematological: Negative for adenopathy. Does not bruise/bleed easily.  Psychiatric/Behavioral: Negative for decreased concentration and dysphoric mood. The patient is nervous/anxious.        Stressors        Objective:   Physical Exam Constitutional:      General: She is not in acute distress.    Appearance: Normal appearance. She is well-developed. She is obese. She is not ill-appearing or diaphoretic.  HENT:     Head: Normocephalic and atraumatic.  Eyes:     General: No scleral icterus.    Conjunctiva/sclera: Conjunctivae normal.     Pupils: Pupils are equal, round, and reactive to light.  Neck:     Thyroid: No thyromegaly.     Vascular: No carotid bruit or JVD.  Cardiovascular:     Rate and Rhythm: Normal rate and regular rhythm.     Heart sounds: Normal heart sounds. No gallop.   Pulmonary:  Effort: Pulmonary effort is normal. No respiratory distress.     Breath sounds: Normal breath sounds. No  wheezing or rales.  Abdominal:     General: Bowel sounds are normal. There is no distension or abdominal bruit.     Palpations: Abdomen is soft. There is no mass.     Tenderness: There is no abdominal tenderness.  Musculoskeletal:     Cervical back: Normal range of motion and neck supple.  Lymphadenopathy:     Cervical: No cervical adenopathy.  Skin:    General: Skin is warm and dry.     Coloration: Skin is not pale.     Findings: No erythema or rash.  Neurological:     Mental Status: She is alert.     Sensory: No sensory deficit.     Coordination: Coordination normal.     Deep Tendon Reflexes: Reflexes are normal and symmetric. Reflexes normal.  Psychiatric:        Mood and Affect: Mood normal.           Assessment & Plan:   Problem List Items Addressed This Visit      Cardiovascular and Mediastinum   Essential hypertension    bp in fair control at this time  BP Readings from Last 1 Encounters:  05/31/20 126/68   No changes needed Most recent labs reviewed  Disc lifstyle change with low sodium diet and exercise  Plan to continue metoprolol 25 mg daily        Endocrine   Diabetes type 2, controlled ([place]) - Primary    Improved with better eating  Enc more exercise  Lab Results  Component Value Date   HGBA1C 7.4 (H) 05/27/2020   Taking glipizide 5 mg in am  Will add 2.5 in evening as tolerated  Reviewed s/s of hypoglycemia  F/u 3 mo  Sent for last eye exam  Intolerant to ace-will plan microalbumin next time On statin        Relevant Medications   glipiZIDE (GLUCOTROL) 5 MG tablet   Hyperlipidemia associated with type 2 diabetes mellitus (HCC)    Good LDL control with atorvastatin 10 mg daily  Trig are down due to better glucose control  HDL up also Disc goals for lipids and reasons to control them Rev last labs with pt Rev low sat fat diet in detail       Relevant Medications   glipiZIDE (GLUCOTROL) 5 MG tablet     Genitourinary   Chronic  kidney disease (CKD) stage G3a/A1, moderately decreased glomerular filtration rate (GFR) between 45-59 mL/min/1.73 square meter and albuminuria creatinine ratio less than 30 mg/g (HCC)    Improved GFR in feb and now stable at 45.9 Enc her to keep working on a good fluid intake for kidney health  Avoid nsaids and nephro toxins         Other   Obesity (BMI 30-39.9)    Discussed how this problem influences overall health and the risks it imposes  Reviewed plan for weight loss with lower calorie diet (via better food choices and also portion control or program like weight watchers) and exercise building up to or more than 30 minutes 5 days per week including some aerobic activity   Commended better eating and enc more exercise gradually      Relevant Medications   glipiZIDE (GLUCOTROL) 5 MG tablet

## 2020-05-31 NOTE — Assessment & Plan Note (Signed)
Good LDL control with atorvastatin 10 mg daily  Trig are down due to better glucose control  HDL up also Disc goals for lipids and reasons to control them Rev last labs with pt Rev low sat fat diet in detail

## 2020-05-31 NOTE — Assessment & Plan Note (Signed)
Discussed how this problem influences overall health and the risks it imposes  Reviewed plan for weight loss with lower calorie diet (via better food choices and also portion control or program like weight watchers) and exercise building up to or more than 30 minutes 5 days per week including some aerobic activity   Commended better eating and enc more exercise gradually

## 2020-05-31 NOTE — Assessment & Plan Note (Signed)
Improved GFR in feb and now stable at 45.9 Enc her to keep working on a good fluid intake for kidney health  Avoid nsaids and nephro toxins

## 2020-05-31 NOTE — Assessment & Plan Note (Signed)
bp in fair control at this time  BP Readings from Last 1 Encounters:  05/31/20 126/68   No changes needed Most recent labs reviewed  Disc lifstyle change with low sodium diet and exercise  Plan to continue metoprolol 25 mg daily

## 2020-05-31 NOTE — Assessment & Plan Note (Addendum)
Improved with better eating  Enc more exercise  Lab Results  Component Value Date   HGBA1C 7.4 (H) 05/27/2020   Taking glipizide 5 mg in am  Will add 2.5 in evening as tolerated  Reviewed s/s of hypoglycemia  F/u 3 mo  Sent for last eye exam  Intolerant to ace-will plan microalbumin next time On statin

## 2020-05-31 NOTE — Patient Instructions (Addendum)
Try taking glipizide 5 mg in the am and 2.5 mg in pm  Watch glucose at different times of day   Think about adding some more exercise as tolerated   Stop at check out please so we can send for your diabetic eye exam   Take care of yourself   Follow up in 3 months

## 2020-06-01 NOTE — Telephone Encounter (Signed)
Pharmacy requests refill on: Metoprolol Tartrate 25 mg   LAST REFILL: 05/14/2019 (Q-90, R-3) LAST OV: 05/31/2020 NEXT OV: 08/31/2020 PHARMACY: [id number] [place], [place]

## 2020-06-28 ENCOUNTER — Encounter: Payer: Self-pay | Admitting: Family Medicine

## 2020-08-02 ENCOUNTER — Other Ambulatory Visit: Payer: Self-pay

## 2020-08-02 ENCOUNTER — Encounter: Payer: Self-pay | Admitting: Family

## 2020-08-02 ENCOUNTER — Telehealth: Payer: Self-pay

## 2020-08-02 ENCOUNTER — Ambulatory Visit: Payer: Medicare PPO | Admitting: Family

## 2020-08-02 VITALS — BP 136/84 | HR 74 | Temp 97.7°F | Ht 65.5 in | Wt 245.6 lb

## 2020-08-02 DIAGNOSIS — G4733 Obstructive sleep apnea (adult) (pediatric): Secondary | ICD-10-CM | POA: Diagnosis not present

## 2020-08-02 DIAGNOSIS — M940 Chondrocostal junction syndrome [Tietze]: Secondary | ICD-10-CM | POA: Diagnosis not present

## 2020-08-02 DIAGNOSIS — Z932 Ileostomy status: Secondary | ICD-10-CM | POA: Diagnosis not present

## 2020-08-02 DIAGNOSIS — R932 Abnormal findings on diagnostic imaging of liver and biliary tract: Secondary | ICD-10-CM | POA: Diagnosis not present

## 2020-08-02 DIAGNOSIS — E119 Type 2 diabetes mellitus without complications: Secondary | ICD-10-CM | POA: Diagnosis not present

## 2020-08-02 DIAGNOSIS — K5190 Ulcerative colitis, unspecified, without complications: Secondary | ICD-10-CM | POA: Diagnosis not present

## 2020-08-02 NOTE — Telephone Encounter (Signed)
Patient called stating she started having pain in the rib area-right side, yesterday when she takes breath in. Pain does not radiate to her arm or neck or face. No chest pain, SOB, nausea, urinary symptoms or abdominal pain. Pain was more intense yesterday than today, described as sharp pain. She is willing to go to another [place] location if needed.

## 2020-08-02 NOTE — Telephone Encounter (Signed)
Spoke to patient by telephone and was advised that she woke up yesterday morning with right side rib area pain. Patient stated that she is concerned because she has not had this pain before. Patient stated that she does not have a gallbladder. Patient stated the pain is a level 2 at times. Patient stated that she is willing to travel to another office. Patient scheduled to see Dutch Quint FNP at [facility] office today at 2:15 pm. Patient was given ER precautions and she verbalized understanding. Patient had a negative covid screening.

## 2020-08-03 ENCOUNTER — Other Ambulatory Visit: Payer: Self-pay | Admitting: Family Medicine

## 2020-08-03 DIAGNOSIS — Z1231 Encounter for screening mammogram for malignant neoplasm of breast: Secondary | ICD-10-CM

## 2020-08-04 DIAGNOSIS — K624 Stenosis of anus and rectum: Secondary | ICD-10-CM | POA: Diagnosis not present

## 2020-08-04 DIAGNOSIS — Z9049 Acquired absence of other specified parts of digestive tract: Secondary | ICD-10-CM | POA: Diagnosis not present

## 2020-08-04 DIAGNOSIS — R1032 Left lower quadrant pain: Secondary | ICD-10-CM | POA: Diagnosis not present

## 2020-08-04 DIAGNOSIS — Z8719 Personal history of other diseases of the digestive system: Secondary | ICD-10-CM | POA: Diagnosis not present

## 2020-08-04 DIAGNOSIS — K219 Gastro-esophageal reflux disease without esophagitis: Secondary | ICD-10-CM | POA: Diagnosis not present

## 2020-08-04 DIAGNOSIS — K9413 Enterostomy malfunction: Secondary | ICD-10-CM | POA: Diagnosis not present

## 2020-08-06 NOTE — Progress Notes (Signed)
Acute Office Visit  Subjective:    Patient ID: Jamie [name], female    DOB: Aug 24, 1955, 65 y.o.   MRN: [id number]  Chief Complaint  Patient presents with  . Flank Pain    With breathing. Starting yesterday.     HPI Patient is in today with c./o right sided pain in the rib area x 1 day. The pain is unlike anything she has ever had before. She can localized the pain by pushing on it. Pain 2/10, worse with taking a deep breath. She is better today. Denies any cough, congestion, heavy lifting or injury.   Past Medical History:  Diagnosis Date  . Arthritis    generalized  . Bowel obstruction (HCC)    repeated (? possible adhesions) neg EGD  . Colitis    with colectomy  . Gall stones 2008  . Gastroenteritis 07/25/14   hosp for IVF   . HTN (hypertension)   . Hyperglycemia    mild-monitors A1C  . Obesity   . Rosacea   . Sleep apnea    CPAP everynight    Past Surgical History:  Procedure Laterality Date  . CHOLECYSTECTOMY    . COLECTOMY  1993   has ileostomy  . COLOSTOMY REVISION  12/06/2011   Procedure: COLOSTOMY REVISION;  Surgeon: Leighton Ruff, MD;  Location[facility];  Service: General;  Laterality: Right;  OSTOMY revision  . EXAMINATION UNDER ANESTHESIA  12/06/2011   Procedure: EXAM UNDER ANESTHESIA;  Surgeon: Leighton Ruff, MD;  Location: [facility];  Service: General;  Laterality: N/A;  rectal exam under anesthesia, anal dilation, pouchoscopy    . TOTAL ABDOMINAL HYSTERECTOMY  05/2006   for abscessed ovaries    Family History  Problem Relation Age of Onset  . Cancer Father        colon  . Liver cancer Father        resection secondary to mets  . Hyperlipidemia Father   . Hypertension Father   . Allergies Father   . Ulcerative colitis Father   . Hypertension Mother   . Hyperlipidemia Mother   . Cirrhosis Mother        NASH  . Diabetes Mother   . Allergies Brother   . Gastric cancer Brother   . Breast cancer Paternal  Grandmother   . Breast cancer Cousin 61       paternal cousins    Social History   Socioeconomic History  . Marital status: Married    Spouse name: Not on file  . Number of children: 3  . Years of education: Not on file  . Highest education level: Not on file  Occupational History  . Occupation: Guardian Ad Litem  Tobacco Use  . Smoking status: Never  . Smokeless tobacco: Never  Vaping Use  . Vaping Use: Never used  Substance and Sexual Activity  . Alcohol use: No    Alcohol/week: 0.0 standard drinks  . Drug use: No  . Sexual activity: Not on file  Other Topics Concern  . Not on file  Social History Narrative  . Not on file   Social Determinants of Health   Financial Resource Strain: Not on file  Food Insecurity: Not on file  Transportation Needs: Not on file  Physical Activity: Not on file  Stress: Not on file  Social Connections: Not on file  Intimate Partner Violence: Not on file    Outpatient Medications Prior to Visit  Medication Sig Dispense Refill  .  Acetaminophen 325 MG CAPS Take 2 capsules by mouth daily as needed.    Marland Kitchen atorvastatin (LIPITOR) 10 MG tablet TAKE 1/2 PILL BY MOUTH EVERY OTHER DAY 24 tablet 1  . Blood Glucose Monitoring Suppl (ACCU-CHEK GUIDE) w/Device KIT To check glucose daily and as needed for type 2 diabetes 1 kit 0  . Cholecalciferol (VITAMIN D3) 25 MCG (1000 UT) CAPS Take 1 capsule by mouth daily.    . Esomeprazole Magnesium (NEXIUM PO) Take 1 tablet by mouth every other day.    . fluticasone (FLONASE) 50 MCG/ACT nasal spray Place 1 spray into both nostrils daily.    Marland Kitchen glipiZIDE (GLUCOTROL) 5 MG tablet Take 5 mg by mouth in the am and 2.5 mg in the pm 1 tablet 0  . glucose blood (ACCU-CHEK GUIDE) test strip To check glucose daily and as needed for type 2 diabetes 100 each 3  . hyoscyamine (LEVSIN SL) 0.125 MG SL tablet Take 1 tablet (0.125 mg total) by mouth every 4 (four) hours as needed. For abdominal cramps 90 tablet 3  . Lancets  (ACCU-CHEK MULTICLIX) lancets To check glucose daily and as needed for diabetes type 2 100 each 3  . metoprolol tartrate (LOPRESSOR) 25 MG tablet TAKE 1 TABLET BY MOUTH EVERY DAY 90 tablet 1  . Multiple Vitamin (MULTIVITAMIN) tablet Take 1 tablet by mouth daily.    . NON FORMULARY SLEEPS WITH BI-PAP    . ondansetron (ZOFRAN ODT) 4 MG disintegrating tablet Take 1 tablet (4 mg total) by mouth every 4 (four) hours as needed for nausea or vomiting. 20 tablet 0  . Peppermint Oil (IBGARD PO) Take 1 capsule by mouth daily.    Marland Kitchen zolpidem (AMBIEN) 10 MG tablet Take 0.5-1 tablets (5-10 mg total) by mouth at bedtime as needed. for sleep 30 tablet 1   No facility-administered medications prior to visit.    Allergies  Allergen Reactions  . Jardiance [Empagliflozin]     Increase in Cr   . Lisinopril     REACTION: felt bad/ stomach hurt/ weak and tired  . Metformin And Related     GI  . Ciprofloxacin Rash    Erythema and pruritis around IV site, erythema and rash along vein  . Sulfa Antibiotics Rash    Review of Systems  Constitutional: Negative.   HENT: Negative.    Eyes: Negative.   Respiratory: Negative.  Negative for wheezing.   Cardiovascular: Negative.   Endocrine: Negative.   Musculoskeletal:        Right chest wall pain  Skin: Negative.   Allergic/Immunologic: Negative.   Neurological: Negative.   Psychiatric/Behavioral: Negative.    All other systems reviewed and are negative.     Objective:    Physical Exam Vitals and nursing note reviewed.  Constitutional:      Appearance: Normal appearance.  Cardiovascular:     Rate and Rhythm: Normal rate. Rhythm irregular.     Pulses: Normal pulses.     Heart sounds: Normal heart sounds.  Pulmonary:     Effort: Pulmonary effort is normal.     Breath sounds: Normal breath sounds.  Chest:     Chest wall: Tenderness present.    Musculoskeletal:     Cervical back: Normal range of motion and neck supple.  Skin:    General: Skin  is warm and dry.  Neurological:     General: No focal deficit present.     Mental Status: She is alert and oriented to person, place, and time.  Psychiatric:        Mood and Affect: Mood normal.   BP 136/84   Pulse 74   Temp 97.7 F (36.5 C) (Temporal)   Ht 5' 5.5" (1.664 m)   Wt 245 lb 9.6 oz (111.4 kg)   SpO2 97%   BMI 40.25 kg/m  Wt Readings from Last 3 Encounters:  08/02/20 245 lb 9.6 oz (111.4 kg)  05/31/20 241 lb 1 oz (109.3 kg)  03/01/20 240 lb (108.9 kg)    Health Maintenance Due  Topic Date Due  . PNA vac Low Risk Adult (1 of 2 - PCV13) 06/08/2019  . OPHTHALMOLOGY EXAM  12/13/2019  . URINE MICROALBUMIN  05/07/2020  . MAMMOGRAM  06/29/2020    There are no preventive care reminders to display for this patient.   Lab Results  Component Value Date   TSH 2.62 05/08/2019   Lab Results  Component Value Date   WBC 8.4 05/08/2019   HGB 12.2 05/08/2019   HCT 37.0 05/08/2019   MCV 91.7 05/08/2019   PLT 268.0 05/08/2019   Lab Results  Component Value Date   NA 140 05/27/2020   K 3.9 05/27/2020   CO2 25 05/27/2020   GLUCOSE 113 (H) 05/27/2020   BUN 14 05/27/2020   CREATININE 1.23 (H) 05/27/2020   BILITOT 0.8 05/27/2020   ALKPHOS 68 05/27/2020   AST 22 05/27/2020   ALT 16 05/27/2020   PROT 6.9 05/27/2020   ALBUMIN 3.7 05/27/2020   CALCIUM 9.1 05/27/2020   ANIONGAP 9 07/28/2014   GFR 45.97 (L) 05/27/2020   Lab Results  Component Value Date   CHOL 132 05/27/2020   Lab Results  Component Value Date   HDL 52.30 05/27/2020   Lab Results  Component Value Date   LDLCALC 45 05/27/2020   Lab Results  Component Value Date   TRIG 174.0 (H) 05/27/2020   Lab Results  Component Value Date   CHOLHDL 3 05/27/2020   Lab Results  Component Value Date   HGBA1C 7.4 (H) 05/27/2020       Assessment & Plan:   Problem List Items Addressed This Visit   None Visit Diagnoses     Costochondritis    -  Primary        Plan: OTC Biofreeze recommended  given patient's history of Ulcerative Colitis. Call the office if symptoms worsen or persist. Recheck as scheduled and sooner as needed.    Kennyth Arnold, FNP

## 2020-08-11 ENCOUNTER — Other Ambulatory Visit: Payer: Self-pay

## 2020-08-11 ENCOUNTER — Ambulatory Visit
Admission: RE | Admit: 2020-08-11 | Discharge: 2020-08-11 | Disposition: A | Discharge status: Home / Self Care | Payer: Medicare PPO | Source: Ambulatory Visit | Attending: Family Medicine | Admitting: Family Medicine

## 2020-08-11 DIAGNOSIS — Z1231 Encounter for screening mammogram for malignant neoplasm of breast: Secondary | ICD-10-CM | POA: Diagnosis not present

## 2020-08-13 DIAGNOSIS — Z9049 Acquired absence of other specified parts of digestive tract: Secondary | ICD-10-CM | POA: Diagnosis not present

## 2020-08-13 DIAGNOSIS — R1032 Left lower quadrant pain: Secondary | ICD-10-CM | POA: Diagnosis not present

## 2020-08-13 DIAGNOSIS — K9413 Enterostomy malfunction: Secondary | ICD-10-CM | POA: Diagnosis not present

## 2020-08-13 DIAGNOSIS — K624 Stenosis of anus and rectum: Secondary | ICD-10-CM | POA: Diagnosis not present

## 2020-08-13 DIAGNOSIS — K219 Gastro-esophageal reflux disease without esophagitis: Secondary | ICD-10-CM | POA: Diagnosis not present

## 2020-08-13 DIAGNOSIS — Z8719 Personal history of other diseases of the digestive system: Secondary | ICD-10-CM | POA: Diagnosis not present

## 2020-08-31 ENCOUNTER — Ambulatory Visit: Payer: Medicare PPO | Admitting: Family Medicine

## 2020-08-31 ENCOUNTER — Encounter: Payer: Self-pay | Admitting: Family Medicine

## 2020-08-31 ENCOUNTER — Other Ambulatory Visit: Payer: Self-pay

## 2020-08-31 VITALS — BP 136/68 | HR 72 | Temp 97.8°F | Ht 65.5 in | Wt 246.2 lb

## 2020-08-31 DIAGNOSIS — E119 Type 2 diabetes mellitus without complications: Secondary | ICD-10-CM

## 2020-08-31 DIAGNOSIS — K9413 Enterostomy malfunction: Secondary | ICD-10-CM | POA: Diagnosis not present

## 2020-08-31 DIAGNOSIS — E669 Obesity, unspecified: Secondary | ICD-10-CM | POA: Diagnosis not present

## 2020-08-31 DIAGNOSIS — N1831 Chronic kidney disease, stage 3a: Secondary | ICD-10-CM

## 2020-08-31 LAB — POCT GLYCOSYLATED HEMOGLOBIN (HGB A1C): Hemoglobin A1C: 7.6 % — AB (ref 4.0–5.6)

## 2020-08-31 MED ORDER — GLIPIZIDE 5 MG PO TABS: ORAL_TABLET | ORAL | 5 refills | Status: DC

## 2020-08-31 NOTE — Assessment & Plan Note (Signed)
Last GFR improved at 45.9  Enc water intake  Enc avoidance of nsaids and switch to voltaren gel instead  Continue to monitor  Urine microalbumin today

## 2020-08-31 NOTE — Assessment & Plan Note (Signed)
Reviewed records from [facility] incl MRI -showing inflammation in terminal ilium  Pt had bloating and pain that is now improved Per pt bx from EGD were unrevealing  Pending ileostomy test upcoming  Has been stressful Difficult to eat properly

## 2020-08-31 NOTE — Assessment & Plan Note (Signed)
Lab Results  Component Value Date   HGBA1C 7.6 (A) 08/31/2020   This is up from 7.4 Inc carbs/snacking /emotional eating in the setting of anxiety and grief Not quite ready to change lifestyle  Plan to inc glipizide to 10 mg in am and 5 in pm closely watching for hypoglycemia Disc low glycemic diet and exercise when ready Per pt eye exam is up to date -need a copy microalb today  Taking a statin  May consider GLP-1 receptor agonist once her GI symptoms are improved to help with weight and glucose control

## 2020-08-31 NOTE — Assessment & Plan Note (Signed)
Discussed how this problem influences overall health and the risks it imposes  Reviewed plan for weight loss with lower calorie diet (via better food choices and also portion control or program like weight watchers) and exercise building up to or more than 30 minutes 5 days per week including some aerobic activity    

## 2020-08-31 NOTE — Patient Instructions (Addendum)
Try going up to 10 mg of glipizide in the am and 5 in the pm  Watch glucose readings closely  If low- we will drop back  Don't skip meals   Any problems or side effects let us know    Gradually get back to low glycemic eating as you can   Try to get most of your carbohydrates from produce (with the exception of white potatoes)  Eat less bread/pasta/rice/snack foods/cereals/sweets and other items from the middle of the grocery store (processed carbs)  Follow up in 3 months   Avoid anti inflammatory medicines for kidney help  Use tylenol instead  Voltaren gel  Drink lots of water

## 2020-08-31 NOTE — Progress Notes (Signed)
Subjective:    Patient ID: Jamie [name], female    DOB: [birth year], 65 y.o.   MRN: [id number]  This visit occurred during the SARS-CoV-2 public health emergency.  Safety protocols were in place, including screening questions prior to the visit, additional usage of staff PPE, and extensive cleaning of exam room while observing appropriate contact time as indicated for disinfecting solutions.   HPI Pt presents for f/u of DM2 and chronic medical problems   Wt Readings from Last 3 Encounters:  08/31/20 246 lb 4 oz (111.7 kg)  08/02/20 245 lb 9.6 oz (111.4 kg)  05/31/20 241 lb 1 oz (109.3 kg)   40.35 kg/m  Feeling ok overall  Her abd pain finally quit   Had egd, mammogram and mri for abd pain  Bx ok from EGD  Doing ileostmy test upcoming (inflammation in terminal ileum)   DM2 Last visit a1c was 7.4 Has not done a lot of self care due to above and brother passed away 2 mo ago   Today went up a little   Lab Results  Component Value Date   HGBA1C 7.6 (A) 08/31/2020   Diet is off the rails-could not prioritize it  While abd pain occurred could not eat as well    We inc glipizide to 5 mg in am and 2.5 mg in evening as tolerated  No hypoglycemia   Glucose levels-less consistent  Fasting usually 130s-140s Later - harder to get a chance to check - can be up to 240s 2 hours after eating   Snacking is more of a problem than meals  Needs portion control  Needs to stop the empty carbs - thinks she can start working on that     Eye exam -per pt up to date  Intolerant to ace Lab Results  Component Value Date   MICROALBUR 10.5 (H) 05/08/2019   MICROALBUR 2.1 (H) 01/30/2018  Due for this   On a statin  Lab Results  Component Value Date   CHOL 132 05/27/2020   HDL 52.30 05/27/2020   LDLCALC 45 05/27/2020   LDLDIRECT 62.0 11/06/2019   TRIG 174.0 (H) 05/27/2020   CHOLHDL 3 05/27/2020   In the past was into to  Metformin -GI side eff Jardiance-renal insuff  Lab  Results  Component Value Date   CREATININE 1.23 (H) 05/27/2020   BUN 14 05/27/2020   NA 140 05/27/2020   K 3.9 05/27/2020   CL 105 05/27/2020   CO2 25 05/27/2020   Declines counseling for now, has good support   Patient Active Problem List   Diagnosis Date Noted   Chronic kidney disease (CKD) stage G3a/A1, moderately decreased glomerular filtration rate (GFR) between 45-59 mL/min/1.73 square meter and albuminuria creatinine ratio less than 30 mg/g ([place]) 05/31/2020   Aortic atherosclerosis ([place]) 03/01/2020   Urinary frequency 12/23/2019   Elevated serum creatinine 05/14/2019   Dermatitis 07/01/2018   Left shoulder pain 11/30/2017   Palpitations 07/05/2017   Ostomy nurse consultation 21/22/4825   Lichen planus 00/37/0488   Hyperlipidemia associated with type 2 diabetes mellitus ([place]) 04/10/2016   Obesity (BMI 30-39.9) 01/08/2015   Red blood cell antibody positive 07/02/2013   Elevated C-reactive protein (CRP) 05/15/2012   Routine general medical examination at a health care facility 09/24/2011   Apnea 11/14/2010   Nevus of lower leg 11/14/2010   Other screening mammogram 10/31/2010   Stress reaction, emotional 05/02/2010   Compulsive overeater 05/02/2010   Arthritis    H/O small bowel  obstruction    KNEE PAIN 10/14/2009   HOARSENESS 07/21/2009   Diabetes type 2, controlled ([place]) 04/23/2009   ROSACEA 09/12/2007   Essential hypertension 05/15/2007   OSA (obstructive sleep apnea) 12/13/2006   Ulcerative colitis ([place]) 07/12/2006   Past Medical History:  Diagnosis Date   Arthritis    generalized   Bowel obstruction ([place])    repeated (? possible adhesions) neg EGD   Colitis    with colectomy   Gall stones 2008   Gastroenteritis 07/25/14   hosp for IVF    HTN (hypertension)    Hyperglycemia    mild-monitors A1C   Obesity    Rosacea    Sleep apnea    CPAP everynight   Past Surgical History:  Procedure Laterality Date   Chance   has  ileostomy   COLOSTOMY REVISION  12/06/2011   Procedure: COLOSTOMY REVISION;  Surgeon: Leighton Ruff, MD;  Location: [facility];  Service: General;  Laterality: Right;  OSTOMY revision   EXAMINATION UNDER ANESTHESIA  12/06/2011   Procedure: EXAM UNDER ANESTHESIA;  Surgeon: Leighton Ruff, MD;  Location: [facility];  Service: General;  Laterality: N/A;  rectal exam under anesthesia, anal dilation, pouchoscopy     TOTAL ABDOMINAL HYSTERECTOMY  05/2006   for abscessed ovaries   Social History   Tobacco Use   Smoking status: Never   Smokeless tobacco: Never  Vaping Use   Vaping Use: Never used  Substance Use Topics   Alcohol use: No    Alcohol/week: 0.0 standard drinks   Drug use: No   Family History  Problem Relation Age of Onset   Cancer Father        colon   Liver cancer Father        resection secondary to mets   Hyperlipidemia Father    Hypertension Father    Allergies Father    Ulcerative colitis Father    Hypertension Mother    Hyperlipidemia Mother    Cirrhosis Mother        NASH   Diabetes Mother    Allergies Brother    Gastric cancer Brother    Breast cancer Paternal Grandmother    Breast cancer Cousin 72       paternal cousins   Allergies  Allergen Reactions   Jardiance [Empagliflozin]     Increase in Cr    Lisinopril     REACTION: felt bad/ stomach hurt/ weak and tired   Metformin And Related     GI   Ciprofloxacin Rash    Erythema and pruritis around IV site, erythema and rash along vein   Sulfa Antibiotics Rash   Current Outpatient Medications on File Prior to Visit  Medication Sig Dispense Refill   Acetaminophen 325 MG CAPS Take 2 capsules by mouth daily as needed.     atorvastatin (LIPITOR) 10 MG tablet TAKE 1/2 PILL BY MOUTH EVERY OTHER DAY 24 tablet 1   Blood Glucose Monitoring Suppl (ACCU-CHEK GUIDE) w/Device KIT To check glucose daily and as needed for type 2 diabetes 1 kit 0   Cholecalciferol (VITAMIN D3) 25 MCG  (1000 UT) CAPS Take 1 capsule by mouth daily.     Esomeprazole Magnesium (NEXIUM PO) Take 1 tablet by mouth every other day.     fluticasone (FLONASE) 50 MCG/ACT nasal spray Place 1 spray into both nostrils daily.     glucose blood (ACCU-CHEK GUIDE) test strip To check glucose daily and  as needed for type 2 diabetes 100 each 3   hyoscyamine (LEVSIN SL) 0.125 MG SL tablet Take 1 tablet (0.125 mg total) by mouth every 4 (four) hours as needed. For abdominal cramps 90 tablet 3   Lancets (ACCU-CHEK MULTICLIX) lancets To check glucose daily and as needed for diabetes type 2 100 each 3   metoprolol tartrate (LOPRESSOR) 25 MG tablet TAKE 1 TABLET BY MOUTH EVERY DAY 90 tablet 1   Multiple Vitamin (MULTIVITAMIN) tablet Take 1 tablet by mouth daily.     NON FORMULARY SLEEPS WITH BI-PAP     ondansetron (ZOFRAN ODT) 4 MG disintegrating tablet Take 1 tablet (4 mg total) by mouth every 4 (four) hours as needed for nausea or vomiting. 20 tablet 0   Peppermint Oil (IBGARD PO) Take 1 capsule by mouth daily.     zolpidem (AMBIEN) 10 MG tablet Take 0.5-1 tablets (5-10 mg total) by mouth at bedtime as needed. for sleep 30 tablet 1   No current facility-administered medications on file prior to visit.    Review of Systems  Constitutional:  Positive for fatigue. Negative for activity change, appetite change, fever and unexpected weight change.  HENT:  Negative for congestion, ear pain, rhinorrhea, sinus pressure and sore throat.   Eyes:  Negative for pain, redness and visual disturbance.  Respiratory:  Negative for cough, shortness of breath and wheezing.   Cardiovascular:  Negative for chest pain and palpitations.  Gastrointestinal:  Negative for abdominal pain, blood in stool, constipation and diarrhea.       Abdominal pain is better   Endocrine: Negative for polydipsia and polyuria.  Genitourinary:  Negative for dysuria, frequency and urgency.  Musculoskeletal:  Negative for arthralgias, back pain and  myalgias.  Skin:  Negative for pallor and rash.  Allergic/Immunologic: Negative for environmental allergies.  Neurological:  Negative for dizziness, syncope and headaches.  Hematological:  Negative for adenopathy. Does not bruise/bleed easily.  Psychiatric/Behavioral:  Positive for dysphoric mood and sleep disturbance. Negative for decreased concentration. The patient is not nervous/anxious.       Objective:   Physical Exam Constitutional:      General: She is not in acute distress.    Appearance: Normal appearance. She is well-developed. She is obese. She is not ill-appearing.  HENT:     Head: Normocephalic and atraumatic.  Eyes:     Conjunctiva/sclera: Conjunctivae normal.     Pupils: Pupils are equal, round, and reactive to light.  Neck:     Thyroid: No thyromegaly.     Vascular: No carotid bruit or JVD.  Cardiovascular:     Rate and Rhythm: Normal rate and regular rhythm.     Heart sounds: Normal heart sounds.    No gallop.  Pulmonary:     Effort: Pulmonary effort is normal. No respiratory distress.     Breath sounds: Normal breath sounds. No wheezing or rales.  Abdominal:     General: Bowel sounds are normal. There is no abdominal bruit.     Palpations: Abdomen is soft.  Musculoskeletal:     Cervical back: Normal range of motion and neck supple.     Right lower leg: No edema.     Left lower leg: No edema.  Lymphadenopathy:     Cervical: No cervical adenopathy.  Skin:    General: Skin is warm and dry.     Coloration: Skin is not pale.     Findings: No rash.  Neurological:     Mental Status: She is  alert.     Coordination: Coordination normal.     Deep Tendon Reflexes: Reflexes are normal and symmetric. Reflexes normal.  Psychiatric:        Mood and Affect: Mood normal.        Cognition and Memory: Cognition and memory normal.          Assessment & Plan:   Problem List Items Addressed This Visit       Digestive   Ileostomy dysfunction ([place])    Reviewed  records from [facility] incl MRI -showing inflammation in terminal ilium  Pt had bloating and pain that is now improved Per pt bx from EGD were unrevealing  Pending ileostomy test upcoming  Has been stressful Difficult to eat properly         Endocrine   Diabetes type 2, controlled ([place]) - Primary    Lab Results  Component Value Date   HGBA1C 7.6 (A) 08/31/2020  This is up from 7.4 Inc carbs/snacking /emotional eating in the setting of anxiety and grief Not quite ready to change lifestyle  Plan to inc glipizide to 10 mg in am and 5 in pm closely watching for hypoglycemia Disc low glycemic diet and exercise when ready Per pt eye exam is up to date -need a copy microalb today  Taking a statin  May consider GLP-1 receptor agonist once her GI symptoms are improved to help with weight and glucose control       Relevant Medications   glipiZIDE (GLUCOTROL) 5 MG tablet   Other Relevant Orders   POCT HgB A1C (Completed)   Microalbumin / creatinine urine ratio     Genitourinary   Chronic kidney disease (CKD) stage G3a/A1, moderately decreased glomerular filtration rate (GFR) between 45-59 mL/min/1.73 square meter and albuminuria creatinine ratio less than 30 mg/g ([facility])    Last GFR improved at 45.9  Enc water intake  Enc avoidance of nsaids and switch to voltaren gel instead  Continue to monitor  Urine microalbumin today         Other   Obesity (BMI 30-39.9)    Discussed how this problem influences overall health and the risks it imposes  Reviewed plan for weight loss with lower calorie diet (via better food choices and also portion control or program like weight watchers) and exercise building up to or more than 30 minutes 5 days per week including some aerobic activity          Relevant Medications   glipiZIDE (GLUCOTROL) 5 MG tablet

## 2020-09-01 ENCOUNTER — Telehealth: Payer: Self-pay | Admitting: Radiology

## 2020-09-01 NOTE — Telephone Encounter (Signed)
We can do at 3 months  Thanks for the heads up

## 2020-09-01 NOTE — Addendum Note (Signed)
Addended by: Ellamae Sia on: 09/01/2020 11:56 AM   Modules accepted: Orders

## 2020-09-01 NOTE — Telephone Encounter (Signed)
[facility] lost this patients urine sample. Patient notified and will recollect in 3 months at her next appt unless you want her to do this sooner.

## 2020-09-03 DIAGNOSIS — G4733 Obstructive sleep apnea (adult) (pediatric): Secondary | ICD-10-CM | POA: Diagnosis not present

## 2020-09-10 ENCOUNTER — Other Ambulatory Visit: Payer: Self-pay | Admitting: Family Medicine

## 2020-09-22 DIAGNOSIS — Z79899 Other long term (current) drug therapy: Secondary | ICD-10-CM | POA: Diagnosis not present

## 2020-09-22 DIAGNOSIS — Z932 Ileostomy status: Secondary | ICD-10-CM | POA: Diagnosis not present

## 2020-09-22 DIAGNOSIS — K529 Noninfective gastroenteritis and colitis, unspecified: Secondary | ICD-10-CM | POA: Diagnosis not present

## 2020-09-22 DIAGNOSIS — G4730 Sleep apnea, unspecified: Secondary | ICD-10-CM | POA: Diagnosis not present

## 2020-09-22 DIAGNOSIS — Z9049 Acquired absence of other specified parts of digestive tract: Secondary | ICD-10-CM | POA: Diagnosis not present

## 2020-09-22 DIAGNOSIS — Z7984 Long term (current) use of oral hypoglycemic drugs: Secondary | ICD-10-CM | POA: Diagnosis not present

## 2020-09-22 DIAGNOSIS — I10 Essential (primary) hypertension: Secondary | ICD-10-CM | POA: Diagnosis not present

## 2020-09-22 DIAGNOSIS — R1032 Left lower quadrant pain: Secondary | ICD-10-CM | POA: Diagnosis not present

## 2020-09-22 DIAGNOSIS — E119 Type 2 diabetes mellitus without complications: Secondary | ICD-10-CM | POA: Diagnosis not present

## 2020-09-22 DIAGNOSIS — Z881 Allergy status to other antibiotic agents status: Secondary | ICD-10-CM | POA: Diagnosis not present

## 2020-09-22 DIAGNOSIS — E785 Hyperlipidemia, unspecified: Secondary | ICD-10-CM | POA: Diagnosis not present

## 2020-09-25 ENCOUNTER — Other Ambulatory Visit: Payer: Self-pay | Admitting: Family Medicine

## 2020-10-08 DIAGNOSIS — Z932 Ileostomy status: Secondary | ICD-10-CM | POA: Diagnosis not present

## 2020-10-08 DIAGNOSIS — E119 Type 2 diabetes mellitus without complications: Secondary | ICD-10-CM | POA: Diagnosis not present

## 2020-10-08 DIAGNOSIS — G4733 Obstructive sleep apnea (adult) (pediatric): Secondary | ICD-10-CM | POA: Diagnosis not present

## 2020-10-08 DIAGNOSIS — K5190 Ulcerative colitis, unspecified, without complications: Secondary | ICD-10-CM | POA: Diagnosis not present

## 2020-10-08 DIAGNOSIS — R932 Abnormal findings on diagnostic imaging of liver and biliary tract: Secondary | ICD-10-CM | POA: Diagnosis not present

## 2020-10-13 DIAGNOSIS — R932 Abnormal findings on diagnostic imaging of liver and biliary tract: Secondary | ICD-10-CM | POA: Diagnosis not present

## 2020-10-13 DIAGNOSIS — Z932 Ileostomy status: Secondary | ICD-10-CM | POA: Diagnosis not present

## 2020-10-13 DIAGNOSIS — K5190 Ulcerative colitis, unspecified, without complications: Secondary | ICD-10-CM | POA: Diagnosis not present

## 2020-10-13 DIAGNOSIS — E119 Type 2 diabetes mellitus without complications: Secondary | ICD-10-CM | POA: Diagnosis not present

## 2020-10-15 ENCOUNTER — Encounter: Payer: Self-pay | Admitting: Family Medicine

## 2020-11-08 DIAGNOSIS — K9413 Enterostomy malfunction: Secondary | ICD-10-CM | POA: Diagnosis not present

## 2020-11-08 DIAGNOSIS — Z8719 Personal history of other diseases of the digestive system: Secondary | ICD-10-CM | POA: Diagnosis not present

## 2020-11-08 DIAGNOSIS — Z9049 Acquired absence of other specified parts of digestive tract: Secondary | ICD-10-CM | POA: Diagnosis not present

## 2020-11-23 ENCOUNTER — Encounter: Payer: Self-pay | Admitting: Family Medicine

## 2020-11-23 NOTE — Telephone Encounter (Signed)
Please call pt and get her scheduled for labs at [facility] instead of [facility]

## 2020-11-23 NOTE — Telephone Encounter (Signed)
Called pt chaned lab apt to [facility] station on 11.10.22

## 2020-11-29 ENCOUNTER — Telehealth: Payer: Self-pay | Admitting: Family Medicine

## 2020-11-29 DIAGNOSIS — E119 Type 2 diabetes mellitus without complications: Secondary | ICD-10-CM

## 2020-11-29 DIAGNOSIS — I10 Essential (primary) hypertension: Secondary | ICD-10-CM

## 2020-11-29 DIAGNOSIS — N1831 Chronic kidney disease, stage 3a: Secondary | ICD-10-CM

## 2020-11-29 NOTE — Addendum Note (Signed)
Addended by: Loura Pardon A on: 11/29/2020 08:28 PM   Modules accepted: Orders

## 2020-11-29 NOTE — Telephone Encounter (Signed)
Patient has a lab appt 12/02/2020, there are no orders in.

## 2020-11-30 ENCOUNTER — Encounter: Payer: Self-pay | Admitting: Family Medicine

## 2020-12-01 ENCOUNTER — Encounter: Payer: Self-pay | Admitting: Family Medicine

## 2020-12-02 ENCOUNTER — Other Ambulatory Visit: Payer: Medicare PPO

## 2020-12-04 ENCOUNTER — Other Ambulatory Visit: Payer: Self-pay | Admitting: Family Medicine

## 2020-12-05 ENCOUNTER — Ambulatory Visit
Admission: EM | Admit: 2020-12-05 | Discharge: 2020-12-05 | Disposition: A | Discharge status: Home / Self Care | Payer: Medicare PPO | Attending: Emergency Medicine | Admitting: Emergency Medicine

## 2020-12-05 ENCOUNTER — Encounter: Payer: Self-pay | Admitting: Emergency Medicine

## 2020-12-05 ENCOUNTER — Other Ambulatory Visit: Payer: Self-pay

## 2020-12-05 DIAGNOSIS — N390 Urinary tract infection, site not specified: Secondary | ICD-10-CM | POA: Diagnosis not present

## 2020-12-05 LAB — POCT URINALYSIS DIP (MANUAL ENTRY)
Glucose, UA: 100 mg/dL — AB
Nitrite, UA: NEGATIVE
Protein Ur, POC: >=300 mg/dL — AB
Spec Grav, UA: 1.015 (ref 1.010–1.025)
Urobilinogen, UA: 4 E.U./dL — AB
pH, UA: 5 (ref 5.0–8.0)

## 2020-12-05 MED ORDER — CEPHALEXIN 500 MG PO CAPS: 500.0000 mg | ORAL_CAPSULE | Freq: Two times a day (BID) | ORAL | 0 refills | Status: AC

## 2020-12-05 NOTE — ED Provider Notes (Signed)
Subjective:    Jamie [name] is a very pleasant 65 y.o. female who presents with concerns for UTI due to urinary frequency over the last 4 days.  Patient reports noticing blood in her urine today. No unilateral back pain, vomiting, fever, vaginal discharge, concern for STD.  Past medical history, past surgical history, current medications reviewed.  Allergies: is allergic to jardiance [empagliflozin], lisinopril, metformin and related, ciprofloxacin, and sulfa antibiotics.  Review of Systems See HPI   Objective:     Vitals:   12/05/20 1318  BP: 122/71  Pulse: 75  Temp: 98.1 F (36.7 C)  SpO2: 97%     General: Appears well-developed and well-nourished. No acute distress.  Cardiovascular: Normal rate. Pulm/Chest: No respiratory distress. Neurological: Alert and oriented to person, place, and time.  Skin: Skin is warm and dry.  Psychiatric: Normal mood, affect, behavior, and thought content.  GU: Deferred secondary to self collect specimen  Laboratory:  Orders Placed This Encounter  Procedures   POCT urinalysis dipstick   Results for orders placed or performed during the hospital encounter of 12/05/20  POCT urinalysis dipstick  Result Value Ref Range   Color, UA red (A) yellow   Clarity, UA cloudy (A) clear   Glucose, UA =100 (A) negative mg/dL   Bilirubin, UA large (A) negative   Ketones, POC UA moderate (40) (A) negative mg/dL   Spec Grav, UA 1.015 1.010 - 1.025   Blood, UA large (A) negative   pH, UA 5.0 5.0 - 8.0   Protein Ur, POC >=300 (A) negative mg/dL   Urobilinogen, UA 4.0 (A) 0.2 or 1.0 E.U./dL   Nitrite, UA Negative Negative   Leukocytes, UA Large (3+) (A) Negative     Assessment:   1. Acute UTI - cephALEXin (KEFLEX) 500 MG capsule; Take 1 capsule (500 mg total) by mouth 2 (two) times daily for 7 days.  Dispense: 14 capsule; Refill: 0  Plan:   MDM: Patient presents with concerns for UTI due to urinary frequency over the last 4 days.  Patient  reports noticing blood in her urine today. No unilateral back pain, vomiting, fever, vaginal discharge, concern for STD.  Urinalysis reveals red cloudy urine, 100 glucose, large bilirubin, moderate ketones, large blood, over 300 protein, 4 urobilinogen and large leukocytes.  Will treat for acute UTI with Keflex.  Creatinine clearance greater than 30.  No dosage adjustment needed for Keflex.  Patient unable to provide a urine culture today, advised that she does plan to follow-up with her PCP later this week.  Advised that if she begins to notice any unilateral back pain, fever or vomiting she should return to clinic for recheck.  Patient verbalized understanding and agreed with plan.  Patient stable upon discharge.  Return as needed.    Discharge Instructions      You were seen today for an infection in the lower urinary tract.  Take antibiotics as directed. Finish course even if feeling better sooner. Drink plenty of clear fluids. Over the counter "Uristat" or "Azo Standard" may help your discomfort.  This will turn your urine dark orange or red and may stain contacts (remove before using). You may experience 24 - 48 hours of continuing discomfort until medication controls the infection.  Return to clinic or go to the ER if you develop a fever, one-sided back pain, or vomiting as these are signs of a worsening infection.          Serafina Royals, [facility] 12/05/20 1342

## 2020-12-05 NOTE — ED Triage Notes (Signed)
Pt presents with urinary frequency x 4 days> she noticed blood in her urine today.

## 2020-12-05 NOTE — Discharge Instructions (Signed)
You were seen today for an infection in the lower urinary tract.  Take antibiotics as directed. Finish course even if feeling better sooner. Drink plenty of clear fluids. Over the counter "Uristat" or "Azo Standard" may help your discomfort.  This will turn your urine dark orange or red and may stain contacts (remove before using). You may experience 24 - 48 hours of continuing discomfort until medication controls the infection.  Return to clinic or go to the ER if you develop a fever, one-sided back pain, or vomiting as these are signs of a worsening infection.

## 2020-12-07 ENCOUNTER — Ambulatory Visit: Payer: Medicare PPO | Admitting: Family Medicine

## 2020-12-13 ENCOUNTER — Other Ambulatory Visit (INDEPENDENT_AMBULATORY_CARE_PROVIDER_SITE_OTHER): Payer: Medicare PPO

## 2020-12-13 ENCOUNTER — Other Ambulatory Visit: Payer: Self-pay

## 2020-12-13 DIAGNOSIS — I10 Essential (primary) hypertension: Secondary | ICD-10-CM

## 2020-12-13 DIAGNOSIS — E119 Type 2 diabetes mellitus without complications: Secondary | ICD-10-CM | POA: Diagnosis not present

## 2020-12-13 DIAGNOSIS — N1831 Chronic kidney disease, stage 3a: Secondary | ICD-10-CM

## 2020-12-13 LAB — COMPREHENSIVE METABOLIC PANEL
ALT: 21 U/L (ref 0–35)
AST: 35 U/L (ref 0–37)
Albumin: 3.7 g/dL (ref 3.5–5.2)
Alkaline Phosphatase: 107 U/L (ref 39–117)
BUN: 30 mg/dL — ABNORMAL HIGH (ref 6–23)
CO2: 20 mEq/L (ref 19–32)
Calcium: 9.6 mg/dL (ref 8.4–10.5)
Chloride: 105 mEq/L (ref 96–112)
Creatinine, Ser: 1.88 mg/dL — ABNORMAL HIGH (ref 0.40–1.20)
GFR: 27.52 mL/min — ABNORMAL LOW (ref >60.00)
Glucose, Bld: 189 mg/dL — ABNORMAL HIGH (ref 70–99)
Potassium: 4.2 mEq/L (ref 3.5–5.1)
Sodium: 135 mEq/L (ref 135–145)
Total Bilirubin: 0.6 mg/dL (ref 0.2–1.2)
Total Protein: 7.9 g/dL (ref 6.0–8.3)

## 2020-12-13 LAB — LIPID PANEL
Cholesterol: 82 mg/dL (ref 0–200)
HDL: 30.9 mg/dL — ABNORMAL LOW (ref >39.00)
LDL Cholesterol: 15 mg/dL (ref 0–99)
NonHDL: 50.9
Total CHOL/HDL Ratio: 3
Triglycerides: 178 mg/dL — ABNORMAL HIGH (ref 0.0–149.0)
VLDL: 35.6 mg/dL (ref 0.0–40.0)

## 2020-12-13 LAB — MICROALBUMIN / CREATININE URINE RATIO
Creatinine,U: 112.7 mg/dL
Microalb Creat Ratio: 3.4 mg/g (ref 0.0–30.0)
Microalb, Ur: 3.8 mg/dL — ABNORMAL HIGH (ref 0.0–1.9)

## 2020-12-13 LAB — HEMOGLOBIN A1C: Hgb A1c MFr Bld: 9.9 % — ABNORMAL HIGH (ref 4.6–6.5)

## 2020-12-14 ENCOUNTER — Ambulatory Visit: Payer: Medicare PPO | Admitting: Family Medicine

## 2020-12-14 ENCOUNTER — Encounter: Payer: Self-pay | Admitting: Family Medicine

## 2020-12-14 VITALS — BP 137/70 | HR 84 | Temp 97.8°F | Ht 65.5 in | Wt 240.5 lb

## 2020-12-14 DIAGNOSIS — I10 Essential (primary) hypertension: Secondary | ICD-10-CM

## 2020-12-14 DIAGNOSIS — N1832 Chronic kidney disease, stage 3b: Secondary | ICD-10-CM | POA: Diagnosis not present

## 2020-12-14 DIAGNOSIS — N3000 Acute cystitis without hematuria: Secondary | ICD-10-CM | POA: Insufficient documentation

## 2020-12-14 DIAGNOSIS — N3001 Acute cystitis with hematuria: Secondary | ICD-10-CM

## 2020-12-14 DIAGNOSIS — E1169 Type 2 diabetes mellitus with other specified complication: Secondary | ICD-10-CM

## 2020-12-14 DIAGNOSIS — E669 Obesity, unspecified: Secondary | ICD-10-CM

## 2020-12-14 DIAGNOSIS — K9413 Enterostomy malfunction: Secondary | ICD-10-CM

## 2020-12-14 DIAGNOSIS — E785 Hyperlipidemia, unspecified: Secondary | ICD-10-CM | POA: Diagnosis not present

## 2020-12-14 DIAGNOSIS — E1122 Type 2 diabetes mellitus with diabetic chronic kidney disease: Secondary | ICD-10-CM

## 2020-12-14 DIAGNOSIS — N1831 Chronic kidney disease, stage 3a: Secondary | ICD-10-CM

## 2020-12-14 LAB — POC URINALSYSI DIPSTICK (AUTOMATED)
Bilirubin, UA: NEGATIVE
Blood, UA: 80
Glucose, UA: NEGATIVE
Ketones, UA: NEGATIVE
Nitrite, UA: NEGATIVE
Protein, UA: POSITIVE — AB
Spec Grav, UA: 1.02 (ref 1.010–1.025)
Urobilinogen, UA: 0.2 E.U./dL
pH, UA: 6 (ref 5.0–8.0)

## 2020-12-14 MED ORDER — FLUCONAZOLE 150 MG PO TABS: 150.0000 mg | ORAL_TABLET | Freq: Once | ORAL | 0 refills | Status: AC

## 2020-12-14 NOTE — Patient Instructions (Addendum)
Work to get at least 64 oz of fluids daily  Mostly water  No sugar drinks  I placed a referral to diabetic teaching  You will get a call   Try to eat a low glycemic diet   Urinalysis today   No more anti inflammatories (aleve, ibuprofen) Tylenol is ok    Check blood sugar Am fasting  2 hours after either lunch or dinner Pick mid day or bed time alternate days  Keep a good log  Follow up in a week or two (for visit and labs) and to review glucose readings

## 2020-12-14 NOTE — Assessment & Plan Note (Signed)
UA still positive No symptoms  Has yeast vag symptoms-diflucan sent  Pending culture to treat, ckd worse also Recently took keflex

## 2020-12-14 NOTE — Assessment & Plan Note (Signed)
Worse  Suspect due to DM and also recent uti  uti may not be gone-cx pending  Enc fluids  inst to abs avoid nsaids  F/u 1-2 wk  May need nephrology ref  Unable to take ace

## 2020-12-14 NOTE — Assessment & Plan Note (Signed)
Disc goals for lipids and reasons to control them Rev last labs with pt Rev low sat fat diet in detail Fairly stable but LDL is down  Trig stable in setting of DM

## 2020-12-14 NOTE — Assessment & Plan Note (Signed)
bp in fair control at this time  BP Readings from Last 1 Encounters:  12/14/20 137/70   No changes needed Most recent labs reviewed  Disc lifstyle change with low sodium diet and exercise  Plan to continue metoprolol 25 mg daily  Unable to take ace

## 2020-12-14 NOTE — Assessment & Plan Note (Signed)
Per pt recently improved

## 2020-12-14 NOTE — Assessment & Plan Note (Signed)
Lab Results  Component Value Date   HGBA1C 9.9 (H) 12/13/2020   Up from 7.6 Not monitoring Eating poorly  She is willing to put more of effort in  Glipizide 10 mg am and 5 in pm  Unable to take metformin  Eye exam is tomorrow  Feeling tired  Rev diet Ref done for DM teaching  Enc activity  microalb is up and renal numbers worse  Enc water  Will check glucose tid and prn-f/u in 1-2 week with record then decide tx plan

## 2020-12-14 NOTE — Progress Notes (Signed)
Subjective:    Patient ID: Jamie [name], female    DOB: 12/08/1955, 64 y.o.   MRN: [id number]  This visit occurred during the SARS-CoV-2 public health emergency.  Safety protocols were in place, including screening questions prior to the visit, additional usage of staff PPE, and extensive cleaning of exam room while observing appropriate contact time as indicated for disinfecting solutions.   HPI Pt presents for f/u of DM2 and HTN and renal insufficiency   Wt Readings from Last 3 Encounters:  12/14/20 240 lb 8 oz (109.1 kg)  08/31/20 246 lb 4 oz (111.7 kg)  08/02/20 245 lb 9.6 oz (111.4 kg)   39.41 kg/m  Wt was up to 257 at end of oct  Had not felt good (some sinus trouble and uti)  Not eating right   HTN bp is stable today  No cp or palpitations or headaches or edema  No side effects to medicines  BP Readings from Last 3 Encounters:  12/14/20 137/70  12/05/20 122/71  08/31/20 136/68    Metoprolol 25 mg daily  Last a1c was 7.6 Lab Results  Component Value Date   HGBA1C 9.9 (H) 12/13/2020  This is significantly increased   Does not check consistently  Has equip   Glipizide 10 mg in am and 5 mg in pm   Interested in DM ed in [place]   Annual eye exam is tomorrow    Not a lot of appetite  Some days just apple sauce and pb crackers  Likes veg/beef soup  Has been more active even though she is not feeling well  No stamina   Ileostomy problem is resolved finally   Ate some fried shrimp in may- made her feel sick, better  Swore off of it    Lab Results  Component Value Date   MICROALBUR 3.8 (H) 12/13/2020   MICROALBUR 10.5 (H) 05/08/2019    CKD  Lab Results  Component Value Date   CREATININE 1.88 (H) 12/13/2020   BUN 30 (H) 12/13/2020   NA 135 12/13/2020   K 4.2 12/13/2020   CL 105 12/13/2020   CO2 20 12/13/2020  Cr is up significantly from 1.23 GFR down to 27.5 from 45.9 Had uti on 11/13 with blood in urine Seen in UC and tx with  keflex She was unable to give enough urine for a   She cannot take ace/arb   Fluid intake is always a struggle Now drinking more water    Thinks the uti was going on for quite a while  Uti is better (symptomatically) Has a yeast infection  Started monistat last night   Results for orders placed or performed in visit on 12/14/20  POCT Urinalysis Dipstick (Automated)  Result Value Ref Range   Color, UA Yellow    Clarity, UA Cloudy    Glucose, UA Negative Negative   Bilirubin, UA Negative    Ketones, UA Negative    Spec Grav, UA 1.020 1.010 - 1.025   Blood, UA 80 Ery/uL    pH, UA 6.0 5.0 - 8.0   Protein, UA Positive (A) Negative   Urobilinogen, UA 0.2 0.2 or 1.0 E.U./dL   Nitrite, UA Negative    Leukocytes, UA Large (3+) (A) Negative      Hyperlipidemia Lab Results  Component Value Date   CHOL 82 12/13/2020   CHOL 132 05/27/2020   CHOL 132 11/06/2019   Lab Results  Component Value Date   HDL 30.90 (L) 12/13/2020  HDL 52.30 05/27/2020   HDL 43.40 11/06/2019   Lab Results  Component Value Date   LDLCALC 15 12/13/2020   LDLCALC 45 05/27/2020   LDLCALC 49 06/27/2017   Lab Results  Component Value Date   TRIG 178.0 (H) 12/13/2020   TRIG 174.0 (H) 05/27/2020   TRIG 246.0 (H) 11/06/2019   Lab Results  Component Value Date   CHOLHDL 3 12/13/2020   CHOLHDL 3 05/27/2020   CHOLHDL 3 11/06/2019   Lab Results  Component Value Date   LDLDIRECT 62.0 11/06/2019   LDLDIRECT 64.0 05/08/2019   LDLDIRECT 62.0 01/30/2018   Takes atorvastatin 5 mg every other day   Patient Active Problem List   Diagnosis Date Noted   Acute cystitis 12/14/2020   Ileostomy dysfunction ([place]) 08/31/2020   Chronic kidney disease (CKD) stage G3a/A1, moderately decreased glomerular filtration rate (GFR) between 45-59 mL/min/1.73 square meter and albuminuria creatinine ratio less than 30 mg/g ([place]) 05/31/2020   Aortic atherosclerosis ([place]) 03/01/2020   Urinary frequency 12/23/2019    Elevated serum creatinine 05/14/2019   Dermatitis 07/01/2018   Left shoulder pain 11/30/2017   Palpitations 07/05/2017   Ostomy nurse consultation 32/44/0102   Lichen planus 72/53/6644   Hyperlipidemia associated with type 2 diabetes mellitus ([place]) 04/10/2016   Obesity (BMI 30-39.9) 01/08/2015   Red blood cell antibody positive 07/02/2013   Elevated C-reactive protein (CRP) 05/15/2012   Routine general medical examination at a health care facility 09/24/2011   Apnea 11/14/2010   Nevus of lower leg 11/14/2010   Other screening mammogram 10/31/2010   Stress reaction, emotional 05/02/2010   Compulsive overeater 05/02/2010   Arthritis    H/O small bowel obstruction    KNEE PAIN 10/14/2009   HOARSENESS 07/21/2009   DM (diabetes mellitus), type 2 with renal complications ([place]) 03/47/4259   ROSACEA 09/12/2007   Essential hypertension 05/15/2007   OSA (obstructive sleep apnea) 12/13/2006   Ulcerative colitis ([place]) 07/12/2006   Past Medical History:  Diagnosis Date   Arthritis    generalized   Bowel obstruction ([place])    repeated (? possible adhesions) neg EGD   Colitis    with colectomy   Gall stones 2008   Gastroenteritis 07/25/14   hosp for IVF    HTN (hypertension)    Hyperglycemia    mild-monitors A1C   Obesity    Rosacea    Sleep apnea    CPAP everynight   Past Surgical History:  Procedure Laterality Date   [place]   has ileostomy   COLOSTOMY REVISION  12/06/2011   Procedure: COLOSTOMY REVISION;  Surgeon: Leighton Ruff, MD;  Location: [facility];  Service: General;  Laterality: Right;  OSTOMY revision   EXAMINATION UNDER ANESTHESIA  12/06/2011   Procedure: EXAM UNDER ANESTHESIA;  Surgeon: Leighton Ruff, MD;  Location: [facility];  Service: General;  Laterality: N/A;  rectal exam under anesthesia, anal dilation, pouchoscopy     TOTAL ABDOMINAL HYSTERECTOMY  05/2006   for abscessed ovaries   Social History    Tobacco Use   Smoking status: Never   Smokeless tobacco: Never  Vaping Use   Vaping Use: Never used  Substance Use Topics   Alcohol use: No    Alcohol/week: 0.0 standard drinks   Drug use: No   Family History  Problem Relation Age of Onset   Cancer Father        colon   Liver cancer Father  resection secondary to mets   Hyperlipidemia Father    Hypertension Father    Allergies Father    Ulcerative colitis Father    Hypertension Mother    Hyperlipidemia Mother    Cirrhosis Mother        NASH   Diabetes Mother    Allergies Brother    Gastric cancer Brother    Breast cancer Paternal Grandmother    Breast cancer Cousin 7       paternal cousins   Allergies  Allergen Reactions   Jardiance [Empagliflozin]     Increase in Cr    Lisinopril     REACTION: felt bad/ stomach hurt/ weak and tired   Metformin And Related     GI   Ciprofloxacin Rash    Erythema and pruritis around IV site, erythema and rash along vein   Sulfa Antibiotics Rash   Current Outpatient Medications on File Prior to Visit  Medication Sig Dispense Refill   ACCU-CHEK GUIDE test strip TO CHECK GLUCOSE DAILY AND AS NEEDED FOR TYPE 2 DIABETES 100 strip 1   Acetaminophen 325 MG CAPS Take 2 capsules by mouth daily as needed.     atorvastatin (LIPITOR) 10 MG tablet TAKE 1/2 PILL BY MOUTH EVERY OTHER DAY 24 tablet 0   Blood Glucose Monitoring Suppl (ACCU-CHEK GUIDE) w/Device KIT To check glucose daily and as needed for type 2 diabetes 1 kit 0   Cholecalciferol (VITAMIN D3) 25 MCG (1000 UT) CAPS Take 1 capsule by mouth daily.     Esomeprazole Magnesium (NEXIUM PO) Take 1 tablet by mouth every other day.     fluticasone (FLONASE) 50 MCG/ACT nasal spray Place 1 spray into both nostrils daily.     glipiZIDE (GLUCOTROL) 5 MG tablet Take 10 mg by mouth in the am and 5 mg in pm 90 tablet 5   hyoscyamine (LEVSIN SL) 0.125 MG SL tablet Take 1 tablet (0.125 mg total) by mouth every 4 (four) hours as needed. For  abdominal cramps 90 tablet 3   Lancets (ACCU-CHEK MULTICLIX) lancets To check glucose daily and as needed for diabetes type 2 100 each 3   metoprolol tartrate (LOPRESSOR) 25 MG tablet TAKE 1 TABLET BY MOUTH EVERY DAY 90 tablet 1   Multiple Vitamin (MULTIVITAMIN) tablet Take 1 tablet by mouth daily.     NON FORMULARY SLEEPS WITH BI-PAP     ondansetron (ZOFRAN ODT) 4 MG disintegrating tablet Take 1 tablet (4 mg total) by mouth every 4 (four) hours as needed for nausea or vomiting. 20 tablet 0   Peppermint Oil (IBGARD PO) Take 1 capsule by mouth daily.     zolpidem (AMBIEN) 10 MG tablet Take 0.5-1 tablets (5-10 mg total) by mouth at bedtime as needed. for sleep 30 tablet 1   No current facility-administered medications on file prior to visit.     Review of Systems  Constitutional:  Positive for activity change, appetite change and fatigue. Negative for fever and unexpected weight change.  HENT:  Negative for congestion, ear pain, rhinorrhea, sinus pressure and sore throat.   Eyes:  Negative for pain, redness and visual disturbance.  Respiratory:  Negative for cough, shortness of breath and wheezing.   Cardiovascular:  Negative for chest pain and palpitations.  Gastrointestinal:  Negative for abdominal pain, blood in stool, constipation and diarrhea.  Endocrine: Negative for polydipsia and polyuria.  Genitourinary:  Negative for dysuria, frequency and urgency.  Musculoskeletal:  Negative for arthralgias, back pain and myalgias.  Skin:  Negative for pallor and rash.  Allergic/Immunologic: Negative for environmental allergies.  Neurological:  Negative for dizziness, syncope and headaches.  Hematological:  Negative for adenopathy. Does not bruise/bleed easily.  Psychiatric/Behavioral:  Negative for decreased concentration and dysphoric mood. The patient is not nervous/anxious.       Objective:   Physical Exam Constitutional:      General: She is not in acute distress.    Appearance: Normal  appearance. She is well-developed. She is obese. She is not ill-appearing or diaphoretic.  HENT:     Head: Normocephalic and atraumatic.  Eyes:     Conjunctiva/sclera: Conjunctivae normal.     Pupils: Pupils are equal, round, and reactive to light.  Neck:     Thyroid: No thyromegaly.     Vascular: No carotid bruit or JVD.  Cardiovascular:     Rate and Rhythm: Normal rate and regular rhythm.     Heart sounds: Normal heart sounds.    No gallop.  Pulmonary:     Effort: Pulmonary effort is normal. No respiratory distress.     Breath sounds: Normal breath sounds. No wheezing or rales.  Abdominal:     General: Bowel sounds are normal. There is no distension or abdominal bruit.     Palpations: Abdomen is soft. There is no mass.     Tenderness: There is no abdominal tenderness. There is right CVA tenderness and left CVA tenderness.     Comments: Very mild cva tenderness  No suprapubic tenderness or fullness    Baseline ostomy  Musculoskeletal:     Cervical back: Normal range of motion and neck supple.     Right lower leg: No edema.     Left lower leg: No edema.  Lymphadenopathy:     Cervical: No cervical adenopathy.  Skin:    General: Skin is warm and dry.     Coloration: Skin is not jaundiced or pale.     Findings: No bruising, erythema or rash.  Neurological:     General: No focal deficit present.     Mental Status: She is alert.     Coordination: Coordination normal.     Deep Tendon Reflexes: Reflexes are normal and symmetric. Reflexes normal.  Psychiatric:        Mood and Affect: Mood normal.          Assessment & Plan:   Problem List Items Addressed This Visit       Cardiovascular and Mediastinum   Essential hypertension    bp in fair control at this time  BP Readings from Last 1 Encounters:  12/14/20 137/70  No changes needed Most recent labs reviewed  Disc lifstyle change with low sodium diet and exercise  Plan to continue metoprolol 25 mg daily  Unable to  take ace        Digestive   Ileostomy dysfunction ([place])    Per pt recently improved         Endocrine   DM (diabetes mellitus), type 2 with renal complications ([place]) - Primary    Lab Results  Component Value Date   HGBA1C 9.9 (H) 12/13/2020  Up from 7.6 Not monitoring Eating poorly  She is willing to put more of effort in  Glipizide 10 mg am and 5 in pm  Unable to take metformin  Eye exam is tomorrow  Feeling tired  Rev diet Ref done for DM teaching  Enc activity  microalb is up and renal numbers worse  Enc water  Will check  glucose tid and prn-f/u in 1-2 week with record then decide tx plan      Relevant Orders   Ambulatory referral to diabetic education   Hyperlipidemia associated with type 2 diabetes mellitus ([place])    Disc goals for lipids and reasons to control them Rev last labs with pt Rev low sat fat diet in detail Fairly stable but LDL is down  Trig stable in setting of DM        Genitourinary   Chronic kidney disease (CKD) stage G3a/A1, moderately decreased glomerular filtration rate (GFR) between 45-59 mL/min/1.73 square meter and albuminuria creatinine ratio less than 30 mg/g ([facility])    Worse  Suspect due to DM and also recent uti  uti may not be gone-cx pending  Enc fluids  inst to abs avoid nsaids  F/u 1-2 wk  May need nephrology ref  Unable to take ace      Acute cystitis    UA still positive No symptoms  Has yeast vag symptoms-diflucan sent  Pending culture to treat, ckd worse also Recently took keflex      Relevant Orders   POCT Urinalysis Dipstick (Automated) (Completed)   Urine Culture     Other   Obesity (BMI 30-39.9)

## 2020-12-15 DIAGNOSIS — E119 Type 2 diabetes mellitus without complications: Secondary | ICD-10-CM | POA: Diagnosis not present

## 2020-12-15 LAB — URINE CULTURE
MICRO NUMBER:: 12669902
Result:: NO GROWTH
SPECIMEN QUALITY:: ADEQUATE

## 2020-12-15 LAB — HM DIABETES EYE EXAM

## 2020-12-21 ENCOUNTER — Other Ambulatory Visit: Payer: Self-pay | Admitting: Family Medicine

## 2020-12-23 ENCOUNTER — Encounter: Payer: Self-pay | Admitting: Family Medicine

## 2020-12-23 ENCOUNTER — Telehealth: Payer: Self-pay | Admitting: *Deleted

## 2020-12-23 NOTE — Telephone Encounter (Signed)
See prev note also:  Day Breakfast Lunch Dinner Bedtime Comments  Pre Post Pre Post Pre Post Pre Post  1  2  3  4  5  6  7  8  9  10  11  12  13  14  15  16  17  18  19  20  21  22  106 116 148 grilled chicken salad, whole grain pasta w/ meat sauce  23 114 195 195 145 pb crackers, [place] and cheese sandwich, didn't feel well  24 192 117 121 Thanksgiving, no desserts or bread  25 125 201 142 ham, 2 deviled eggs, whole gr pasta and meat sauce  26 150 136 169 1/2 gd beef steak, apple, salad, 1 slice thin crust pizza  27 123 134 130 white meat chicken, slaw, whole gr pasta and meat sauce  28 137 177 104 1/2 beef patty, apples, chipped beef, 1 piece toast, gr beans  29 209 205 171 *sick gut overnight, egg, 1/2 [place] wrap, baked chips  30 122 201 139 pb crackers,1/2 [place] wrap, 1/8 chicken pie (small portion)  31 92 **Dec 1st  For more diabetes resources visit: www.[employer]amatrix.com 3074-60029 Rev B

## 2020-12-23 NOTE — Telephone Encounter (Signed)
Please increase your glipizide to 10 mg twice daily (please change in chart and refill if needed)  Watch for low glucose Watch diet best you can  F/u in about 3 months

## 2020-12-23 NOTE — Telephone Encounter (Signed)
Pt sent a mychart with her Blood sugar reading and a message that says:  I will attach my log for the last 9 days. As probably predicted, my numbers seem to be all over the place. I averaged the 29 numbers I have and got 148. I am no longer aware of any symptoms of the UTI. Still have no stamina, but I'm pushing forward!  My appt with you is Dec 7th

## 2020-12-27 NOTE — Telephone Encounter (Signed)
Called pt and no answer and pt's VM box is full

## 2020-12-29 ENCOUNTER — Ambulatory Visit: Payer: Medicare PPO | Admitting: Family Medicine

## 2020-12-29 ENCOUNTER — Other Ambulatory Visit: Payer: Self-pay

## 2020-12-29 ENCOUNTER — Encounter: Payer: Self-pay | Admitting: Family Medicine

## 2020-12-29 VITALS — BP 122/76 | HR 55 | Temp 97.6°F | Ht 65.5 in | Wt 242.4 lb

## 2020-12-29 DIAGNOSIS — E1122 Type 2 diabetes mellitus with diabetic chronic kidney disease: Secondary | ICD-10-CM

## 2020-12-29 DIAGNOSIS — N184 Chronic kidney disease, stage 4 (severe): Secondary | ICD-10-CM

## 2020-12-29 DIAGNOSIS — R7989 Other specified abnormal findings of blood chemistry: Secondary | ICD-10-CM

## 2020-12-29 NOTE — Assessment & Plan Note (Signed)
GFR was down to 27.5 last time- concerning This was however in setting of a uti  Now clinically improved and uc neg  No nsaids Great water intake  No episodes of dehydration / her GI status with ostomy if improved  bmet today  Consider nephrology referral if not significantly improved

## 2020-12-29 NOTE — Telephone Encounter (Signed)
Addressed directly with PCP at f/u appt today

## 2020-12-29 NOTE — Patient Instructions (Addendum)
Continue your current medicines   Continue working on diet  Add some walking when you are ready   Drink lots of water Avoid anti inflammatory medicines   Lab for kidney function   Follow up for visit and a1c after 03/15/20

## 2020-12-29 NOTE — Progress Notes (Signed)
Subjective:    Patient ID: Jamie [name], female    DOB: [birth year], 65 y.o.   MRN: [id number]  This visit occurred during the SARS-CoV-2 public health emergency.  Safety protocols were in place, including screening questions prior to the visit, additional usage of staff PPE, and extensive cleaning of exam room while observing appropriate contact time as indicated for disinfecting solutions.   HPI Pt presents for f/u of DM2   Wt Readings from Last 3 Encounters:  12/29/20 242 lb 6 oz (109.9 kg)  12/14/20 240 lb 8 oz (109.1 kg)  08/31/20 246 lb 4 oz (111.7 kg)   39.72 kg/m  Weight has not changed much here  Just ate before coming in   This am 235 lb at home    Last visit a1c was up Lab Results  Component Value Date   HGBA1C 9.9 (H) 12/13/2020   Pt noted poor diet  Taking glipizide 10 mg in am and 5 in pm  Cannot take ace or arb Unable to take metformin with ckd also  Ref was done for DM teaching    Lab Results  Component Value Date   MICROALBUR 3.8 (H) 12/13/2020   MICROALBUR 10.5 (H) 05/08/2019    Glucose readings early this mo varied from one teens to 205 Average of 148  Notes that pretzels really spike her sugar  Had not been snacking until this past week   From this week Am 90s, one 60 and 78   (one 219 after eating pretzels) Post lunc 120s to 204 Post dinner 139 Pre bedtime 130s average  One am glucose was 60  Ate salad the night before    She is working very very hard on diet  Has worked on keeping protein up with meals  Has a boost product with low carbs and high protein    Had her eye exam  No retinopathy  No big changes Waiting to change px until glucose is better   Will need cataract surgery    She feels better and has more energy  More exercise tolerant More active -but not out walking yet   Has to find motivation   CKD Lab Results  Component Value Date   CREATININE 1.88 (H) 12/13/2020   BUN 30 (H) 12/13/2020   NA 135  12/13/2020   K 4.2 12/13/2020   CL 105 12/13/2020   CO2 20 12/13/2020  GFR down to 27.5 Ua was pos for leuk and protein but culture was clear  Drinking lots of water -good about that  No uti symptoms   Had one night of sick gut/diarrhea  That does not happen as often as it used to thankfully  Stays hydrated   Patient Active Problem List   Diagnosis Date Noted   Acute cystitis 12/14/2020   Ileostomy dysfunction ([place]) 08/31/2020   CKD (chronic kidney disease) stage 4, GFR 15-29 ml/min ([place]) 05/31/2020   Aortic atherosclerosis ([place]) 03/01/2020   Urinary frequency 12/23/2019   Elevated serum creatinine 05/14/2019   Dermatitis 07/01/2018   Left shoulder pain 11/30/2017   Palpitations 07/05/2017   Ostomy nurse consultation 42/59/5638   Lichen planus 75/64/3329   Hyperlipidemia associated with type 2 diabetes mellitus ([place]) 04/10/2016   Obesity (BMI 30-39.9) 01/08/2015   Red blood cell antibody positive 07/02/2013   Elevated C-reactive protein (CRP) 05/15/2012   Routine general medical examination at a health care facility 09/24/2011   Apnea 11/14/2010   Nevus of lower leg 11/14/2010   Other  screening mammogram 10/31/2010   Stress reaction, emotional 05/02/2010   Compulsive overeater 05/02/2010   Arthritis    H/O small bowel obstruction    KNEE PAIN 10/14/2009   HOARSENESS 07/21/2009   DM (diabetes mellitus), type 2 with renal complications ([place]) 07/37/1062   ROSACEA 09/12/2007   Essential hypertension 05/15/2007   OSA (obstructive sleep apnea) 12/13/2006   Ulcerative colitis ([place]) 07/12/2006   Past Medical History:  Diagnosis Date   Arthritis    generalized   Bowel obstruction ([place])    repeated (? possible adhesions) neg EGD   Colitis    with colectomy   Gall stones 2008   Gastroenteritis 07/25/14   hosp for IVF    HTN (hypertension)    Hyperglycemia    mild-monitors A1C   Obesity    Rosacea    Sleep apnea    CPAP everynight   Past Surgical History:   Procedure Laterality Date   [place]   has ileostomy   COLOSTOMY REVISION  12/06/2011   Procedure: COLOSTOMY REVISION;  Surgeon: Leighton Ruff, MD;  Location: [facility];  Service: General;  Laterality: Right;  OSTOMY revision   EXAMINATION UNDER ANESTHESIA  12/06/2011   Procedure: EXAM UNDER ANESTHESIA;  Surgeon: Leighton Ruff, MD;  Location: [facility];  Service: General;  Laterality: N/A;  rectal exam under anesthesia, anal dilation, pouchoscopy     TOTAL ABDOMINAL HYSTERECTOMY  05/2006   for abscessed ovaries   Social History   Tobacco Use   Smoking status: Never   Smokeless tobacco: Never  Vaping Use   Vaping Use: Never used  Substance Use Topics   Alcohol use: No    Alcohol/week: 0.0 standard drinks   Drug use: No   Family History  Problem Relation Age of Onset   Cancer Father        colon   Liver cancer Father        resection secondary to mets   Hyperlipidemia Father    Hypertension Father    Allergies Father    Ulcerative colitis Father    Hypertension Mother    Hyperlipidemia Mother    Cirrhosis Mother        NASH   Diabetes Mother    Allergies Brother    Gastric cancer Brother    Breast cancer Paternal Grandmother    Breast cancer Cousin 62       paternal cousins   Allergies  Allergen Reactions   Jardiance [Empagliflozin]     Increase in Cr    Lisinopril     REACTION: felt bad/ stomach hurt/ weak and tired   Metformin And Related     GI   Ciprofloxacin Rash    Erythema and pruritis around IV site, erythema and rash along vein   Sulfa Antibiotics Rash   Current Outpatient Medications on File Prior to Visit  Medication Sig Dispense Refill   ACCU-CHEK GUIDE test strip TO CHECK GLUCOSE DAILY AND AS NEEDED FOR TYPE 2 DIABETES 100 strip 1   Acetaminophen 325 MG CAPS Take 2 capsules by mouth daily as needed.     atorvastatin (LIPITOR) 10 MG tablet TAKE 1/2 PILL BY MOUTH EVERY OTHER DAY 24  tablet 1   Blood Glucose Monitoring Suppl (ACCU-CHEK GUIDE) w/Device KIT To check glucose daily and as needed for type 2 diabetes 1 kit 0   Cholecalciferol (VITAMIN D3) 25 MCG (1000 UT) CAPS Take 1 capsule by mouth daily.  Esomeprazole Magnesium (NEXIUM PO) Take 1 tablet by mouth every other day.     fluticasone (FLONASE) 50 MCG/ACT nasal spray Place 1 spray into both nostrils daily.     glipiZIDE (GLUCOTROL) 5 MG tablet Take 10 mg by mouth in the am and 5 mg in pm 90 tablet 5   hyoscyamine (LEVSIN SL) 0.125 MG SL tablet Take 1 tablet (0.125 mg total) by mouth every 4 (four) hours as needed. For abdominal cramps 90 tablet 3   Lancets (ACCU-CHEK MULTICLIX) lancets To check glucose daily and as needed for diabetes type 2 100 each 3   metoprolol tartrate (LOPRESSOR) 25 MG tablet TAKE 1 TABLET BY MOUTH EVERY DAY 90 tablet 1   Multiple Vitamin (MULTIVITAMIN) tablet Take 1 tablet by mouth daily.     NON FORMULARY SLEEPS WITH BI-PAP     ondansetron (ZOFRAN ODT) 4 MG disintegrating tablet Take 1 tablet (4 mg total) by mouth every 4 (four) hours as needed for nausea or vomiting. 20 tablet 0   Peppermint Oil (IBGARD PO) Take 1 capsule by mouth daily.     zolpidem (AMBIEN) 10 MG tablet Take 0.5-1 tablets (5-10 mg total) by mouth at bedtime as needed. for sleep 30 tablet 1   No current facility-administered medications on file prior to visit.    Review of Systems  Constitutional:  Positive for fatigue. Negative for activity change, appetite change, fever and unexpected weight change.       Fatigue is not as bad now  HENT:  Negative for congestion, ear pain, rhinorrhea, sinus pressure and sore throat.   Eyes:  Negative for pain, redness and visual disturbance.  Respiratory:  Negative for cough, shortness of breath and wheezing.   Cardiovascular:  Negative for chest pain and palpitations.  Gastrointestinal:  Negative for abdominal pain, blood in stool, constipation and diarrhea.  Endocrine: Negative  for polydipsia and polyuria.  Genitourinary:  Negative for dysuria, frequency and urgency.  Musculoskeletal:  Negative for arthralgias, back pain and myalgias.  Skin:  Negative for pallor and rash.  Allergic/Immunologic: Negative for environmental allergies.  Neurological:  Negative for dizziness, syncope and headaches.  Hematological:  Negative for adenopathy. Does not bruise/bleed easily.  Psychiatric/Behavioral:  Negative for decreased concentration and dysphoric mood. The patient is not nervous/anxious.       Objective:   Physical Exam Constitutional:      General: She is not in acute distress.    Appearance: Normal appearance. She is obese. She is not ill-appearing or diaphoretic.  HENT:     Head: Normocephalic and atraumatic.  Eyes:     General: No scleral icterus.    Conjunctiva/sclera: Conjunctivae normal.     Pupils: Pupils are equal, round, and reactive to light.  Cardiovascular:     Rate and Rhythm: Regular rhythm. Bradycardia present.     Heart sounds: Normal heart sounds.  Pulmonary:     Effort: Pulmonary effort is normal. No respiratory distress.     Breath sounds: Normal breath sounds. No wheezing or rales.  Musculoskeletal:     Cervical back: Neck supple.     Right lower leg: No edema.     Left lower leg: No edema.  Lymphadenopathy:     Cervical: No cervical adenopathy.  Skin:    Coloration: Skin is not pale.     Findings: No rash.  Neurological:     Mental Status: She is alert.     Cranial Nerves: No cranial nerve deficit.  Psychiatric:  Mood and Affect: Mood normal.          Assessment & Plan:   Problem List Items Addressed This Visit       Endocrine   DM (diabetes mellitus), type 2 with renal complications ([place]) - Primary    Lab Results  Component Value Date   HGBA1C 9.9 (H) 12/13/2020  Since last visit doing much better with diet  Taking glipizide 10 in am and 5 pm  With am glucose readings in 90s will not go up on pm dose  Disc  poss of GLP med later  Enc to keep up the better nutrition and be more active as she tolerates it  Still pending DM teaching appt-she is open to that  bmet today  F/u for visit with a1c late feb (3 mo from last a1c)        Genitourinary   CKD (chronic kidney disease) stage 4, GFR 15-29 ml/min (H[facility])    GFR was down to 27.5 last time- concerning This was however in setting of a uti  Now clinically improved and uc neg  No nsaids Great water intake  No episodes of dehydration / her GI status with ostomy if improved  bmet today  Consider nephrology referral if not significantly improved        Relevant Orders   Basic metabolic panel     Other   Elevated serum creatinine

## 2020-12-29 NOTE — Assessment & Plan Note (Signed)
Lab Results  Component Value Date   HGBA1C 9.9 (H) 12/13/2020   Since last visit doing much better with diet  Taking glipizide 10 in am and 5 pm  With am glucose readings in 90s will not go up on pm dose  Disc poss of GLP med later  Enc to keep up the better nutrition and be more active as she tolerates it  Still pending DM teaching appt-she is open to that  bmet today  F/u for visit with a1c late feb (3 mo from last a1c)

## 2020-12-30 ENCOUNTER — Encounter: Payer: Self-pay | Admitting: Family Medicine

## 2020-12-30 DIAGNOSIS — N1832 Chronic kidney disease, stage 3b: Secondary | ICD-10-CM

## 2020-12-30 DIAGNOSIS — I10 Essential (primary) hypertension: Secondary | ICD-10-CM

## 2020-12-30 LAB — BASIC METABOLIC PANEL
BUN: 23 mg/dL (ref 6–23)
CO2: 23 mEq/L (ref 19–32)
Calcium: 9.8 mg/dL (ref 8.4–10.5)
Chloride: 107 mEq/L (ref 96–112)
Creatinine, Ser: 1.64 mg/dL — ABNORMAL HIGH (ref 0.40–1.20)
GFR: 32.41 mL/min — ABNORMAL LOW (ref >60.00)
Glucose, Bld: 104 mg/dL — ABNORMAL HIGH (ref 70–99)
Potassium: 4.5 mEq/L (ref 3.5–5.1)
Sodium: 137 mEq/L (ref 135–145)

## 2021-01-06 ENCOUNTER — Telehealth: Payer: Self-pay | Admitting: Family Medicine

## 2021-01-10 ENCOUNTER — Encounter: Payer: Self-pay | Admitting: Family Medicine

## 2021-01-11 NOTE — Telephone Encounter (Signed)
See mychart message. Does pt need an appt or is it okay to drop off a UA

## 2021-01-12 ENCOUNTER — Ambulatory Visit (INDEPENDENT_AMBULATORY_CARE_PROVIDER_SITE_OTHER): Payer: Medicare PPO

## 2021-01-12 ENCOUNTER — Other Ambulatory Visit: Payer: Self-pay | Admitting: *Deleted

## 2021-01-12 ENCOUNTER — Other Ambulatory Visit: Payer: Self-pay

## 2021-01-12 ENCOUNTER — Other Ambulatory Visit: Payer: Self-pay | Admitting: Family Medicine

## 2021-01-12 DIAGNOSIS — R350 Frequency of micturition: Secondary | ICD-10-CM

## 2021-01-12 DIAGNOSIS — R3915 Urgency of urination: Secondary | ICD-10-CM

## 2021-01-12 LAB — POC URINALSYSI DIPSTICK (AUTOMATED)
Bilirubin, UA: NEGATIVE
Blood, UA: NEGATIVE
Glucose, UA: NEGATIVE
Ketones, UA: NEGATIVE
Nitrite, UA: NEGATIVE
Protein, UA: POSITIVE — AB
Spec Grav, UA: 1.025 (ref 1.010–1.025)
Urobilinogen, UA: 0.2 E.U./dL
pH, UA: 6 (ref 5.0–8.0)

## 2021-01-12 NOTE — Telephone Encounter (Signed)
Left VM requesting pt to call the office back 

## 2021-01-12 NOTE — Telephone Encounter (Signed)
Urine has wbc in it again  Drink more water if able   Pended keflex to send for pharmacy of choice -please send to her preferred   Let's see how the culture comes out next If symptoms become severe let us know or go to ER

## 2021-01-13 ENCOUNTER — Telehealth: Payer: Self-pay | Admitting: Family Medicine

## 2021-01-13 LAB — URINE CULTURE
MICRO NUMBER:: 12785784
SPECIMEN QUALITY:: ADEQUATE

## 2021-01-13 MED ORDER — CEPHALEXIN 500 MG PO CAPS: 500.0000 mg | ORAL_CAPSULE | Freq: Two times a day (BID) | ORAL | 0 refills | Status: DC

## 2021-01-13 MED ORDER — FLUCONAZOLE 150 MG PO TABS: 150.0000 mg | ORAL_TABLET | Freq: Once | ORAL | 0 refills | Status: AC

## 2021-01-13 NOTE — Telephone Encounter (Signed)
Pt notified of UA results and Dr. Marliss Coots comments. Rx sent to pharmacy.   Pt also ask if PCP will send in Diflucan, pt said abx always cause her to get a yeast inf. Pt is going out of town next week and wants to make sure she has that incase she gets one. She is aware not to take the diflucan while on abx but afterwards if she starts having sxs.   [facility] [place]

## 2021-01-13 NOTE — Telephone Encounter (Signed)
Addressed, see separate note

## 2021-01-13 NOTE — Telephone Encounter (Signed)
Pt is wanting a call back concerning her most recent labs. Please advise pt at [phone].

## 2021-01-13 NOTE — Telephone Encounter (Signed)
I sent it  

## 2021-01-18 ENCOUNTER — Telehealth: Payer: Self-pay

## 2021-01-18 NOTE — Telephone Encounter (Signed)
Pt notified of Dr. Marliss Coots comments and instructions and verbalized understanding. Pt will keep [place] posted if she has any questions or concerns

## 2021-01-18 NOTE — Telephone Encounter (Signed)
Glad her symptoms are mild. It is too late for antiviral at this point.  Agree with symptom care and isolation/fluids and rest  Please update if symptoms worsen or change

## 2021-01-18 NOTE — Telephone Encounter (Signed)
I spoke with pt; pt tested + covid on 01/15/21; pt's symptoms started on 01/13/21; pt did not go to UC or ED. No fever, body aches. No H/a today. Last H/A was pain level of 2 on 01/17/21. Pt has prod cough and pt has not looked at color of phlegm. No SOB, no CP and no wheezing. No diarrhea or vomiting. No S/T. Pt has runnynose and head congestion. Pt said her symptoms have been mild. ? Too late to start a oral med for covid. Pt had covid vaccines and couple of covid boosters. [facility].pt said abx was sent in for UTI. Pt was unable to pick up until Sat due to pharmacy being without power on Fri. Pt said UTI is much better since taking abx that was started on 01/15/21. UC.  Self quarantine, drink plenty of fluids, rest, and take Tylenol for fever. UC & ED precautions given and pt voiced understanding. No available appts at [facility] today; sending note to Dr Glori Bickers and Janifer Adie and will teams [place] also. Pt request cb after reviewed by Dr Glori Bickers.

## 2021-01-18 NOTE — Telephone Encounter (Signed)
[facility] Night - Client TELEPHONE ADVICE RECORD [facility] Patient Name: Jamie [name] Gender: Female DOB: [birth year] Age: 65 Y 52 M 50 D Return Phone Number: [phone] (Primary) Address: City/ State/ Zip: [place] [address] Client [place] Night - Client Client Site [place] Provider Glori Bickers, Roque Lias - MD Contact Type Call Who Is Calling Patient / Member / Family / Caregiver Call Type Triage / Clinical Relationship To Patient Self Return Phone Number [phone] (Primary) Chief Complaint Headache Reason for Call Symptomatic / Request for [place] states she tested positive for Covid this morning, Cough, headache. Sinus pressure. Translation No Nurse Assessment Nurse: Brion Aliment, RN, Mickel Baas Date/Time Eilene Ghazi Time): 01/15/2021 10:44:28 AM Confirm and document reason for call. If symptomatic, describe symptoms. ---Caller states she tested positive for Covid this morning on a home test. Symptoms started Thursday. No fever, cough, headache, & sinus pressure. Is currently treating a UTI, is on Cephalexin will start today. Does the patient have any new or worsening symptoms? ---Yes Will a triage be completed? ---Yes Related visit to physician within the last 2 weeks? ---No Does the PT have any chronic conditions? (i.e. diabetes, asthma, this includes High risk factors for pregnancy, etc.) ---Yes List chronic conditions. ---DM II, ileostomy, chronic kidney disease, HTN Is this a behavioral health or substance abuse call? ---No Guidelines Guideline Title Affirmed Question Affirmed Notes Nurse Date/Time (Eastern Time) COVID-19 - Diagnosed or Suspected [1] HIGH RISK for severe COVID complications (e.g., weak immune system, age > 64 years, obesity with BMI 30 or higher, pregnant, chronic lung disease or other Brion Aliment, RN, Mickel Baas 01/15/2021  10:47:51 AM PLEASE NOTE: All timestamps contained within this report are represented as [place] Standard Time. CONFIDENTIALTY NOTICE: This fax transmission is intended only for the addressee. It contains information that is legally privileged, confidential or otherwise protected from use or disclosure. If you are not the intended recipient, you are strictly prohibited from reviewing, disclosing, copying using or disseminating any of this information or taking any action in reliance on or regarding this information. If you have received this fax in error, please notify us immediately by telephone so that we can arrange for its return to [place]. Phone: [phone], Toll-Free: [phone], Fax: [phone] Page: 2 of 3 Call Id: [id number] Guidelines Guideline Title Affirmed Question Affirmed Notes Nurse Date/Time Eilene Ghazi Time) chronic medical condition) AND [2] COVID symptoms (e.g., cough, fever) (Exceptions: Already seen by PCP and no new or worsening symptoms.) Disp. Time Eilene Ghazi Time) Disposition Final User 01/15/2021 11:02:52 AM Call PCP within 24 Hours Yes Brion Aliment, RN, Elige Radon Disagree/Comply Comply Caller Understands Yes PreDisposition Did not know what to do Care Advice Given Per Guideline CALL PCP WITHIN 24 HOURS: * IF OFFICE WILL BE CLOSED: I'll page the on-call provider now. EXCEPTION: from 9 pm to 9 am. Since this isn't urgent, we'll hold the page until morning. * Fever: For fever over 101 F (38.3 C), take acetaminophen every 4 to 6 hours (Adults 650 mg) OR ibuprofen every 6 to 8 hours (Adults 400 mg). Before taking any medicine, read all the instructions on the package. Do not take aspirin unless your doctor has prescribed it for you. * COUGH DROPS: Over-the-counter cough drops can help a lot, especially for mild coughs. They soothe an irritated throat and remove the tickle sensation in the back of the throat. Cough drops are easy to carry with you. COUGH MEDICINES: *  COUGH SYRUP  WITH DEXTROMETHORPHAN: An over-the-counter cough syrup can help your cough. The most common cough suppressant in over-the-counter cough medicines is dextromethorphan. * Examples: Delsym 12-hour Cough, Robitussin Cough Long-Acting, Triaminic Long-Acting, Vicks DayQuil Cough. COUGH SYRUP WITH DEXTROMETHORPHAN: * Cough syrups containing the cough suppressant dextromethorphan may help decrease your cough. * Cough syrup works best for coughs that keep you awake at night. It can also sometimes help in the late stages of a lung or airway infection when the cough is dry and hacking. Cough syrup can be used along with cough drops. FEVER MEDICINES: * For fevers above 101 F (38.3 C) take either acetaminophen or ibuprofen. * They are over-the-counter (OTC) drugs that help treat both fever and pain. You can buy them at the drugstore. * You need to discuss this with your doctor (or NP/PA) within the next 24 hours. CALL BACK IF: * You become worse CARE ADVICE given per COVID-19 - DIAGNOSED OR SUSPECTED (Adult) guideline. * The symptoms are generally treated the same whether you have COVID-19, influenza or some other respiratory virus. * Cough: Use cough drops. * Feeling dehydrated: Drink extra liquids. If the air in your home is dry, use a humidifier. GENERAL CARE ADVICE FOR COVID-19 SYMPTOMS: * Do not try to completely stop coughs that produce mucus and phlegm. * Coughing is helpful. It brings up the mucus from the lungs and helps prevent pneumonia. Comments User: Glenetta Borg, RN Date/Time Eilene Ghazi Time): 01/15/2021 10:49:31 AM headache 3/10 User: Glenetta Borg, RN Date/Time Eilene Ghazi Time): 01/15/2021 10:54:18 AM PLEASE NOTE: All timestamps contained within this report are represented as [place] Standard Time. CONFIDENTIALTY NOTICE: This fax transmission is intended only for the addressee. It contains information that is legally privileged, confidential or otherwise protected from use or  disclosure. If you are not the intended recipient, you are strictly prohibited from reviewing, disclosing, copying using or disseminating any of this information or taking any action in reliance on or regarding this information. If you have received this fax in error, please notify us immediately by telephone so that we can arrange for its return to [place]. Phone: [phone], Toll-Free: [phone], Fax: [phone] Page: 3 of 3 Call Id: [id number] Comments current digital oral temp 99.9 Referrals REFERRED TO PCP OFFIC

## 2021-01-21 NOTE — Telephone Encounter (Signed)
error 

## 2021-01-27 ENCOUNTER — Encounter: Payer: Self-pay | Admitting: Family Medicine

## 2021-02-09 ENCOUNTER — Telehealth: Payer: Self-pay

## 2021-02-09 NOTE — Telephone Encounter (Signed)
Called and spoke to patient, she will bring in SD card to CPAP on 1/19. Nothing further needed

## 2021-02-10 ENCOUNTER — Other Ambulatory Visit: Payer: Self-pay

## 2021-02-10 ENCOUNTER — Encounter: Payer: Self-pay | Admitting: Internal Medicine

## 2021-02-10 ENCOUNTER — Ambulatory Visit: Payer: Medicare PPO | Admitting: Internal Medicine

## 2021-02-10 VITALS — BP 138/80 | HR 87 | Temp 98.3°F | Ht 65.5 in | Wt 237.4 lb

## 2021-02-10 DIAGNOSIS — R051 Acute cough: Secondary | ICD-10-CM | POA: Diagnosis not present

## 2021-02-10 DIAGNOSIS — G4733 Obstructive sleep apnea (adult) (pediatric): Secondary | ICD-10-CM | POA: Diagnosis not present

## 2021-02-10 DIAGNOSIS — U071 COVID-19: Secondary | ICD-10-CM

## 2021-02-10 MED ORDER — ALBUTEROL SULFATE HFA 108 (90 BASE) MCG/ACT IN AERS
2.0000 | INHALATION_SPRAY | RESPIRATORY_TRACT | 2 refills | Status: DC | PRN
Start: 2021-02-10 — End: 2022-05-29

## 2021-02-10 NOTE — Progress Notes (Signed)
°  [facility] Consultation     Synopsis Establish care for sleep apnea several years ago  **CPAP download data 11/28/2017-12/28/2018>> raw data personally reviewed, she greater than 4 hours is 19/30 days (63%).  Average usage on days used for 33 minutes.  Pressure is 19/10.  Residual AHI is 5.9 leaks are minimal. **Review of 30 days of download data as of 12/21/16; usage is 30/30 days.  Average usage on days used is 5 hours 51 minutes.  Current settings are her curve 10 V auto; BiPAP 19/10 residual AHI is 3.2.  -home sleep study from the December 20th 2012:AHI 37.  CPAP download January 2023 87 (compliance for days 57 compliance for greater than 4 hours AHI reduced to 2.5 BiPAP IPAP 19 EPAP 10   CC Follow-up OSA Cough from COVID   HPI:   On BiPAP full facemask  AMBIEN AS NEEDED EXCELLENT COMPLIANCE Compliance report reviewed in detail with patient AHI reduced to 2.2   No exacerbation at this time No evidence of heart failure at this time No evidence or signs of infection at this time No respiratory distress No fevers, chills, nausea, vomiting, diarrhea No evidence of lower extremity edema No evidence hemoptysis  + COVID-19 infection Cough wheezing shortness of breath associated with infection Will try albuterol as needed   Medication:       Allergies:  Lisinopril; Ciprofloxacin; and Sulfa antibiotics   Review of Systems:  Gen:  Denies  fever, sweats, chills weight loss  HEENT: Denies blurred vision, double vision, ear pain, eye pain, hearing loss, nose bleeds, sore throat Cardiac:  No dizziness, chest pain or heaviness, chest tightness,edema, No JVD Resp:  + cough, -sputum production, -shortness of breath,-wheezing, -hemoptysis,  Other:  All other systems negative  BP 138/80 (BP Location: Left Arm, Cuff Size: Large)    Pulse 87    Temp 98.3 F (36.8 C) (Temporal)    Ht 5' 5.5" (1.664 m)    Wt 237 lb 6.4 oz (107.7 kg)    SpO2 97%    BMI  38.90 kg/m      Physical Examination:   General Appearance: No distress  EYES PERRLA, EOM intact.   NECK Supple, No JVD Pulmonary: normal breath sounds, No wheezing.  CardiovascularNormal S1,S2.  No m/r/g.   ALL OTHER ROS ARE NEGATIVE     Assessment and Plan:  66 year old white female with a diagnosis of SEVERE OSA Recent COVID-19 infection  SEVERE OSA ON BIPAP AND WORKING WELL BENEFITS FROM THERAPY AHI IS WELL CONTROLLED  Recent COVID-19 infection Albuterol as needed 2 to 4 puffs every 4 hours  MEDICATION ADJUSTMENTS/LABS AND TESTS ORDERED: CONTINUE BIPAP AS PRESCRIBED AMBIEN AS NEEDED Albuterol as needed  CURRENT MEDICATIONS REVIEWED AT LENGTH WITH PATIENT TODAY   Patient satisfied with Plan of action and management. All questions answered  Follow up in [place], M.D.  [facility]  Medical Director [place] Director [facility] Cardio-Pulmonary Department

## 2021-02-10 NOTE — Patient Instructions (Signed)
Continue BiPAP as needed We will try albuterol 2 to 4 puffs every 4 hours as needed for intermittent cough and wheezing

## 2021-02-15 ENCOUNTER — Other Ambulatory Visit: Payer: Self-pay | Admitting: Nephrology

## 2021-02-15 DIAGNOSIS — E1122 Type 2 diabetes mellitus with diabetic chronic kidney disease: Secondary | ICD-10-CM | POA: Diagnosis not present

## 2021-02-15 DIAGNOSIS — N1832 Chronic kidney disease, stage 3b: Secondary | ICD-10-CM

## 2021-02-15 DIAGNOSIS — I10 Essential (primary) hypertension: Secondary | ICD-10-CM | POA: Diagnosis not present

## 2021-02-15 DIAGNOSIS — R809 Proteinuria, unspecified: Secondary | ICD-10-CM | POA: Diagnosis not present

## 2021-02-18 ENCOUNTER — Other Ambulatory Visit: Payer: Self-pay

## 2021-02-18 ENCOUNTER — Ambulatory Visit
Admission: RE | Admit: 2021-02-18 | Discharge: 2021-02-18 | Disposition: A | Discharge status: Home / Self Care | Payer: Medicare PPO | Source: Ambulatory Visit | Attending: Nephrology | Admitting: Nephrology

## 2021-02-18 DIAGNOSIS — E1122 Type 2 diabetes mellitus with diabetic chronic kidney disease: Secondary | ICD-10-CM | POA: Diagnosis not present

## 2021-02-18 DIAGNOSIS — N189 Chronic kidney disease, unspecified: Secondary | ICD-10-CM | POA: Diagnosis not present

## 2021-02-18 DIAGNOSIS — N1832 Chronic kidney disease, stage 3b: Secondary | ICD-10-CM | POA: Insufficient documentation

## 2021-02-18 DIAGNOSIS — N200 Calculus of kidney: Secondary | ICD-10-CM | POA: Diagnosis not present

## 2021-02-23 ENCOUNTER — Emergency Department
Admission: EM | Admit: 2021-02-23 | Discharge: 2021-02-23 | Disposition: A | Discharge status: Home / Self Care | Payer: Medicare PPO | Attending: Emergency Medicine | Admitting: Emergency Medicine

## 2021-02-23 ENCOUNTER — Encounter: Payer: Self-pay | Admitting: Emergency Medicine

## 2021-02-23 ENCOUNTER — Emergency Department: Payer: Medicare PPO

## 2021-02-23 ENCOUNTER — Other Ambulatory Visit: Payer: Self-pay

## 2021-02-23 DIAGNOSIS — N390 Urinary tract infection, site not specified: Secondary | ICD-10-CM

## 2021-02-23 DIAGNOSIS — R197 Diarrhea, unspecified: Secondary | ICD-10-CM | POA: Insufficient documentation

## 2021-02-23 DIAGNOSIS — K529 Noninfective gastroenteritis and colitis, unspecified: Secondary | ICD-10-CM | POA: Diagnosis not present

## 2021-02-23 DIAGNOSIS — Z20822 Contact with and (suspected) exposure to covid-19: Secondary | ICD-10-CM | POA: Diagnosis not present

## 2021-02-23 DIAGNOSIS — R109 Unspecified abdominal pain: Secondary | ICD-10-CM | POA: Insufficient documentation

## 2021-02-23 DIAGNOSIS — R112 Nausea with vomiting, unspecified: Secondary | ICD-10-CM | POA: Diagnosis present

## 2021-02-23 DIAGNOSIS — I10 Essential (primary) hypertension: Secondary | ICD-10-CM | POA: Insufficient documentation

## 2021-02-23 LAB — URINALYSIS, ROUTINE W REFLEX MICROSCOPIC
Bilirubin Urine: NEGATIVE
Glucose, UA: NEGATIVE mg/dL
Ketones, ur: NEGATIVE mg/dL
Nitrite: NEGATIVE
Protein, ur: 100 mg/dL — AB
Specific Gravity, Urine: 1.015 (ref 1.005–1.030)
WBC, UA: >50 WBC/hpf — ABNORMAL HIGH (ref 0–5)
pH: 5.5 (ref 5.0–8.0)

## 2021-02-23 LAB — COMPREHENSIVE METABOLIC PANEL
ALT: 25 U/L (ref 0–44)
AST: 35 U/L (ref 15–41)
Albumin: 3.9 g/dL (ref 3.5–5.0)
Alkaline Phosphatase: 82 U/L (ref 38–126)
Anion gap: 11 (ref 5–15)
BUN: 22 mg/dL (ref 8–23)
CO2: 19 mmol/L — ABNORMAL LOW (ref 22–32)
Calcium: 10.7 mg/dL — ABNORMAL HIGH (ref 8.9–10.3)
Chloride: 106 mmol/L (ref 98–111)
Creatinine, Ser: 1.79 mg/dL — ABNORMAL HIGH (ref 0.44–1.00)
GFR, Estimated: 31 mL/min — ABNORMAL LOW (ref >60)
Glucose, Bld: 204 mg/dL — ABNORMAL HIGH (ref 70–99)
Potassium: 4.2 mmol/L (ref 3.5–5.1)
Sodium: 136 mmol/L (ref 135–145)
Total Bilirubin: 0.9 mg/dL (ref 0.3–1.2)
Total Protein: 7.9 g/dL (ref 6.5–8.1)

## 2021-02-23 LAB — CBC WITH DIFFERENTIAL/PLATELET
Abs Immature Granulocytes: 0.1 10*3/uL — ABNORMAL HIGH (ref 0.00–0.07)
Basophils Absolute: 0.1 10*3/uL (ref 0.0–0.1)
Basophils Relative: 0 %
Eosinophils Absolute: 0.3 10*3/uL (ref 0.0–0.5)
Eosinophils Relative: 2 %
HCT: 44.3 % (ref 36.0–46.0)
Hemoglobin: 13.9 g/dL (ref 12.0–15.0)
Immature Granulocytes: 1 %
Lymphocytes Relative: 19 %
Lymphs Abs: 3.3 10*3/uL (ref 0.7–4.0)
MCH: 30.8 pg (ref 26.0–34.0)
MCHC: 31.4 g/dL (ref 30.0–36.0)
MCV: 98 fL (ref 80.0–100.0)
Monocytes Absolute: 0.9 10*3/uL (ref 0.1–1.0)
Monocytes Relative: 5 %
Neutro Abs: 12.5 10*3/uL — ABNORMAL HIGH (ref 1.7–7.7)
Neutrophils Relative %: 73 %
Platelets: 302 10*3/uL (ref 150–400)
RBC: 4.52 MIL/uL (ref 3.87–5.11)
RDW: 14.7 % (ref 11.5–15.5)
WBC: 17.1 10*3/uL — ABNORMAL HIGH (ref 4.0–10.5)
nRBC: 0 % (ref 0.0–0.2)

## 2021-02-23 LAB — RESP PANEL BY RT-PCR (FLU A&B, COVID) ARPGX2
Influenza A by PCR: NEGATIVE
Influenza B by PCR: NEGATIVE
SARS Coronavirus 2 by RT PCR: NEGATIVE

## 2021-02-23 LAB — LIPASE, BLOOD: Lipase: 52 U/L — ABNORMAL HIGH (ref 11–51)

## 2021-02-23 MED ORDER — ONDANSETRON HCL 4 MG/2ML IJ SOLN
4.0000 mg | Freq: Once | INTRAMUSCULAR | Status: AC
Start: 2021-02-23 — End: 2021-02-23
  Administered 2021-02-23: 4 mg via INTRAVENOUS
  Filled 2021-02-23: qty 2

## 2021-02-23 MED ORDER — CEPHALEXIN 500 MG PO CAPS: 500.0000 mg | ORAL_CAPSULE | Freq: Two times a day (BID) | ORAL | 0 refills | Status: DC

## 2021-02-23 MED ORDER — SODIUM CHLORIDE 0.9 % IV SOLN
1.0000 g | Freq: Once | INTRAVENOUS | Status: AC
  Administered 2021-02-23: 1 g via INTRAVENOUS
  Filled 2021-02-23: qty 10

## 2021-02-23 MED ORDER — SODIUM CHLORIDE 0.9 % IV BOLUS
1000.0000 mL | Freq: Once | INTRAVENOUS | Status: AC
  Administered 2021-02-23: 1000 mL via INTRAVENOUS

## 2021-02-23 MED ORDER — ONDANSETRON 4 MG PO TBDP: 4.0000 mg | ORAL_TABLET | Freq: Three times a day (TID) | ORAL | 0 refills | Status: DC | PRN

## 2021-02-23 NOTE — ED Triage Notes (Signed)
Patient ambulatory to triage with steady gait, without difficulty or distress noted; pt reports diarrhea since yesterday, V x 1; denies any abd pain, has ileostomy in place

## 2021-02-23 NOTE — ED Notes (Signed)
Patient given pillow

## 2021-02-23 NOTE — ED Provider Notes (Addendum)
[facility] Provider Note    Event Date/Time   First MD Initiated Contact with Patient 02/23/21 [phone]     (approximate)  History   Chief Complaint: Diarrhea  HPI  Jamie [name] is a 66 y.o. female with a past medical history of ulcerative colitis status post ostomy 30 years ago, hypertension, presents to the emergency department for loose stool.  According to the patient since yesterday she has been experiencing significant amounts of diarrhea from her ostomy.  States she was also nauseated with 1 episode of vomiting this morning.  No fever.  Denies any significant abdominal pain but does state some generalized cramping/discomfort.  No urinary symptoms.  Physical Exam   Triage Vital Signs: ED Triage Vitals  Enc Vitals Group     BP 02/23/21 0551 136/70     Pulse Rate 02/23/21 0551 91     Resp 02/23/21 0551 18     Temp 02/23/21 0551 (!) 97.5 F (36.4 C)     Temp src --      SpO2 02/23/21 0551 95 %     Weight 02/23/21 0542 236 lb (107 kg)     Height 02/23/21 0542 5' 5"  (1.651 m)     Head Circumference --      Peak Flow --      Pain Score 02/23/21 0542 0     Pain Loc --      Pain Edu? --      Excl. in [place]? --     Most recent vital signs: Vitals:   02/23/21 0907 02/23/21 1002  BP: (!) 150/48 (!) 155/50  Pulse: 72 100  Resp:  18  Temp:    SpO2: 99% 100%    General: Awake, no distress.  CV:  Good peripheral perfusion.  Regular rate and rhythm  Resp:  Normal effort.  Equal breath sounds bilaterally.  Abd:  No distention, soft, slight diffuse tenderness without focal tenderness identified.  Ostomy does have significant stool output which appears watery.  No blood or black.   ED Results / Procedures / Treatments   RADIOLOGY  I have personally reviewed the CT images, no concern for bowel obstruction on my evaluation. CT scan read by radiology shows no acute abnormality, there is an 8 mm left proximal ureteral stone.  Patient states she is aware  this and had a renal ultrasound performed last week and is following up with urology.    MEDICATIONS ORDERED IN ED: Medications  ondansetron (ZOFRAN) injection 4 mg (4 mg Intravenous Patient Refused/Not Given 02/23/21 0829)  cefTRIAXone (ROCEPHIN) 1 g in sodium chloride 0.9 % 100 mL IVPB (has no administration in time range)  sodium chloride 0.9 % bolus 1,000 mL (0 mLs Intravenous Stopped 02/23/21 1005)     IMPRESSION / MDM / ASSESSMENT AND PLAN / ED COURSE  I reviewed the triage vital signs and the nursing notes.  Patient presents to the emergency department for diarrhea as well as nausea and vomiting since yesterday.  Given the patient's history of ulcerative colitis with an ostomy present with significant watery stool output we will obtain CT imaging the abdomen/pelvis.  Patient's lab work overall is reassuring, chemistry shows chronic kidney disease with a creatinine of 1.79, largely unchanged from prior values, lipase slightly elevated at 52 but no epigastric tenderness.  Patient is CBC shows moderate leukocytosis otherwise reassuring.  Given these findings we will obtain a CT scan abdomen/pelvis to further evaluate and rule out infection, partial obstruction.  We will IV hydrate and treat with IV Zofran.  Patient agreeable to plan of care.  Patient's work-up is overall reassuring highly suspect gastroenteritis.  CT scan is negative for acute abnormality does show a left proximal ureteral stone that is nonobstructive, patient is aware of this and is following up with urology just had an ultrasound performed last week of this.  Patient's urinalysis is consistent with urinary tract infection.  I have sent a urine culture as a precaution.  We will treat with IV Rocephin.  We will discharge the patient with Zofran as well as oral Keflex.  I discussed with the patient PCP follow-up as well as return precautions.  Patient agreeable to plan of care.  FINAL CLINICAL IMPRESSION(S) / ED DIAGNOSES    Nausea vomiting diarrhea Abdominal pain Urinary tract infection  Rx / DC Orders   Zofran Keflex PCP follow-up  Note:  This document was prepared using Dragon voice recognition software and may include unintentional dictation errors.   Harvest Dark, MD 02/23/21 1015    Harvest Dark, MD 02/23/21 1016

## 2021-02-24 LAB — URINE CULTURE: Culture: 40000 — AB

## 2021-03-01 ENCOUNTER — Encounter: Payer: Self-pay | Admitting: Family Medicine

## 2021-03-01 MED ORDER — FLUCONAZOLE 150 MG PO TABS: 150.0000 mg | ORAL_TABLET | Freq: Once | ORAL | 0 refills | Status: AC

## 2021-03-11 ENCOUNTER — Encounter: Payer: Self-pay | Admitting: Family Medicine

## 2021-03-14 ENCOUNTER — Other Ambulatory Visit: Payer: Self-pay | Admitting: Family Medicine

## 2021-03-17 ENCOUNTER — Other Ambulatory Visit: Payer: Self-pay | Admitting: *Deleted

## 2021-03-17 DIAGNOSIS — N201 Calculus of ureter: Secondary | ICD-10-CM

## 2021-03-17 NOTE — Progress Notes (Signed)
03/18/21 6:01 PM   Jamie [name] February 04, 1955 [id number]  Referring provider:  Anthonette Legato, MD [place],  [place] [address] Chief Complaint  Patient presents with   Nephrolithiasis     HPI: Jamie [name] is a 66 y.o.female who presents today for further evaluation of left renal calculus.   She has a personal history of type 2 diabetes mellitus with diabetic chronic kidney disease stage 3b. She is followed by nephrology.   On 05/27/2020 creatinine was 1.2 with an EGFR 45. Subsequent to this on 12/13/2020 creatinine was up to 1.8 with an EGFR of 27. Most recently on 12/29/2020 creatinine was down to 1.6 with an EGFR of 32.  She also has history of ileostomy x 30 years and is prone to dehydration.  RUS on 02/18/2021 for chronic renal disease visualized 8 mm nonobstructing left renal calculus.   She was seen in the ED on 02/23/2021 for amounts of diarrhea from her ostomy. Urine culture grew Lactobacillus . Urinalysis showed trace Hgb, moderate leukocytes, >50 WBCs, and few bacteria. CT abdomen and pelvis visualized nonobstructive 8 mm stone in the left proximal ureter. No hydroureteronephrosis. Probable additional nonobstructive left lower pole renal stones. A stone of this size in the ureter would be expected to be obstructive, thus given no hydroureteronephrosis this could reflect a poorly functioning left kidney. Her creatinine at the time was 1.79. She was discharged on Keflex.   CT was personally reviewed today and stone in left proximal ureter was found to be about 1000 Hounsfield units and stone to skin distance in 12 cm.   She reports that she has had 3 UTIs since November. She was symptomatic and she experienced blood in her urine.  The last "infection" where she grew lactobacillus, she was otherwise asymptomatic.  She is asymptomatic today although urine is suspicious.  PMH: Past Medical History:  Diagnosis Date   Arthritis    generalized   Bowel  obstruction ([place])    repeated (? possible adhesions) neg EGD   Colitis    with colectomy   Gall stones 2008   Gastroenteritis 07/25/14   hosp for IVF    HTN (hypertension)    Hyperglycemia    mild-monitors A1C   Obesity    Rosacea    Sleep apnea    CPAP everynight    Surgical History: Past Surgical History:  Procedure Laterality Date   [place]   has ileostomy   COLOSTOMY REVISION  12/06/2011   Procedure: COLOSTOMY REVISION;  Surgeon: Leighton Ruff, MD;  Location: [facility];  Service: General;  Laterality: Right;  OSTOMY revision   EXAMINATION UNDER ANESTHESIA  12/06/2011   Procedure: EXAM UNDER ANESTHESIA;  Surgeon: Leighton Ruff, MD;  Location: [facility];  Service: General;  Laterality: N/A;  rectal exam under anesthesia, anal dilation, pouchoscopy     TOTAL ABDOMINAL HYSTERECTOMY  05/2006   for abscessed ovaries    Home Medications:  Allergies as of 03/18/2021       Reactions   Jardiance [empagliflozin]    Increase in Cr    Lisinopril    REACTION: felt bad/ stomach hurt/ weak and tired   Metformin And Related    GI   Ciprofloxacin Rash   Erythema and pruritis around IV site, erythema and rash along vein   Sulfa Antibiotics Rash        Medication List  Accurate as of March 18, 2021  6:01 PM. If you have any questions, ask your nurse or doctor.          Accu-Chek Guide test strip Generic drug: glucose blood TO CHECK GLUCOSE DAILY AND AS NEEDED FOR TYPE 2 DIABETES   Accu-Chek Guide w/Device Kit To check glucose daily and as needed for type 2 diabetes   accu-chek multiclix lancets To check glucose daily and as needed for diabetes type 2   Acetaminophen 325 MG Caps Take 2 capsules by mouth daily as needed.   albuterol 108 (90 Base) MCG/ACT inhaler Commonly known as: VENTOLIN HFA Inhale 2 puffs into the lungs every 4 (four) hours as needed for wheezing or shortness of breath.    atorvastatin 10 MG tablet Commonly known as: LIPITOR TAKE 1/2 PILL BY MOUTH EVERY OTHER DAY   cephALEXin 500 MG capsule Commonly known as: Keflex Take 1 capsule (500 mg total) by mouth 3 (three) times daily for 7 days. What changed: when to take this Changed by: Hollice Espy, MD   fluconazole 150 MG tablet Commonly known as: DIFLUCAN Take 1 tablet (150 mg total) by mouth once for 1 dose. Started by: Gordy Clement, CMA   fluticasone 50 MCG/ACT nasal spray Commonly known as: FLONASE Place 1 spray into both nostrils daily.   glipiZIDE 5 MG tablet Commonly known as: GLUCOTROL Take 10 mg by mouth in the am and 5 mg in pm   hyoscyamine 0.125 MG SL tablet Commonly known as: LEVSIN SL Take 1 tablet (0.125 mg total) by mouth every 4 (four) hours as needed. For abdominal cramps   losartan 25 MG tablet Commonly known as: COZAAR Take 25 mg by mouth daily.   metoprolol tartrate 25 MG tablet Commonly known as: LOPRESSOR TAKE 1 TABLET BY MOUTH EVERY DAY   multivitamin tablet Take 1 tablet by mouth daily.   NEXIUM PO Take 1 tablet by mouth every other day.   NON FORMULARY SLEEPS WITH BI-PAP   ondansetron 4 MG disintegrating tablet Commonly known as: ZOFRAN-ODT Take 1 tablet (4 mg total) by mouth every 8 (eight) hours as needed for nausea or vomiting.   Vitamin D3 25 MCG (1000 UT) Caps Take 1 capsule by mouth daily.   zolpidem 10 MG tablet Commonly known as: AMBIEN Take 0.5-1 tablets (5-10 mg total) by mouth at bedtime as needed. for sleep        Allergies:  Allergies  Allergen Reactions   Jardiance [Empagliflozin]     Increase in Cr    Lisinopril     REACTION: felt bad/ stomach hurt/ weak and tired   Metformin And Related     GI   Ciprofloxacin Rash    Erythema and pruritis around IV site, erythema and rash along vein   Sulfa Antibiotics Rash    Family History: Family History  Problem Relation Age of Onset   Cancer Father        colon   Liver cancer  Father        resection secondary to mets   Hyperlipidemia Father    Hypertension Father    Allergies Father    Ulcerative colitis Father    Hypertension Mother    Hyperlipidemia Mother    Cirrhosis Mother        NASH   Diabetes Mother    Allergies Brother    Gastric cancer Brother    Breast cancer Paternal Grandmother    Breast cancer Cousin 51  paternal cousins    Social History:  reports that she has never smoked. She has never used smokeless tobacco. She reports that she does not drink alcohol and does not use drugs.   Physical Exam: BP 130/80    Pulse 81    Ht 5' 5.5" (1.664 m)    Wt 240 lb (108.9 kg)    BMI 39.33 kg/m   Constitutional:  Alert and oriented, No acute distress. HEENT: [place] AT, moist mucus membranes.  Trachea midline, no masses. Cardiovascular: No clubbing, cyanosis, or edema. Respiratory: Normal respiratory effort, no increased work of breathing. Skin: No rashes, bruises or suspicious lesions. Neurologic: Grossly intact, no focal deficits, moving all 4 extremities. Psychiatric: Normal mood and affect.  Laboratory Data: Lab Results  Component Value Date   CREATININE 1.79 (H) 02/23/2021   Lab Results  Component Value Date   HGBA1C 9.9 (H) 12/13/2020    Urinalysis Component     Latest Ref Rng & Units 03/18/2021  Color, Urine     YELLOW YELLOW  Appearance     CLEAR HAZY (A)  Specific Gravity, Urine     1.005 - 1.030 1.025  pH     5.0 - 8.0 6.0  Glucose, UA     NEGATIVE mg/dL NEGATIVE  Hgb urine dipstick     NEGATIVE NEGATIVE  Bilirubin Urine     NEGATIVE NEGATIVE  Ketones, ur     NEGATIVE mg/dL NEGATIVE  Protein     NEGATIVE mg/dL 100 (A)  Nitrite     NEGATIVE NEGATIVE  Leukocytes,Ua     NEGATIVE SMALL (A)  Squamous Epithelial / LPF     0 - 5 11-20  WBC, UA     0 - 5 WBC/hpf >50  RBC / HPF     0 - 5 RBC/hpf 0-5  Bacteria, UA     NONE SEEN MANY (A)  WBC Clumps      PRESENT  Granular Casts, UA      PRESENT    Pertinent  Imaging: CLINICAL DATA:  Chronic renal disease.   EXAM: RENAL / URINARY TRACT ULTRASOUND COMPLETE   COMPARISON:  None.   FINDINGS: Right Kidney:   Renal measurements: 11.9 cm x 3.8 cm x 4.5 cm = volume: 106 mL. Echogenicity within normal limits. No mass or hydronephrosis visualized.   Left Kidney:   Renal measurements: 12.3 cm x 4.5 cm x 4.2 cm = volume: 121 mL. Echogenicity within normal limits. An 8 mm shadowing echogenic calculus is seen within the mid left kidney. No mass or hydronephrosis visualized.   Bladder:   Poorly distended and subsequently limited in evaluation.   Other:   None.   IMPRESSION: 8 mm nonobstructing left renal calculus.     Electronically Signed   By: Virgina Norfolk M.D.   On: 02/19/2021 20:49  CLINICAL DATA:  Abdominal pain, acute, nonlocalized Abd pain, N/V   EXAM: CT ABDOMEN AND PELVIS WITHOUT CONTRAST   TECHNIQUE: Multidetector CT imaging of the abdomen and pelvis was performed following the standard protocol without IV contrast.   RADIATION DOSE REDUCTION: This exam was performed according to the departmental dose-optimization program which includes automated exposure control, adjustment of the mA and/or kV according to patient size and/or use of iterative reconstruction technique.   COMPARISON:  CT abdomen pelvis 09/11/2008.   FINDINGS: Lower chest: Bibasilar linear scarring/atelectasis. No acute abnormality.   Hepatobiliary: Mildly nodular contours. There are is infiltrative, ill-defined hypodensity within the right and left hepatic lobes (series  2, images 24-29). Prior cholecystectomy.   Pancreas: Unremarkable. No pancreatic ductal dilatation or surrounding inflammatory changes.   Spleen: Normal in size without focal abnormality.   Adrenals/Urinary Tract: Adrenal glands are unremarkable. No hydronephrosis. There is an 8 mm stone within the left proximal ureter. There are additional more faint calcifications in  the lower pole the left kidney which appear nonobstructive. There is no hydroureteronephrosis. The bladder is minimally distended.   Stomach/Bowel: The stomach is within normal limits. Prior colectomy with right lower quadrant ileostomy. There is no evidence of bowel obstruction.   Vascular/Lymphatic: Aortoiliac atherosclerotic calcifications. No AAA. No lymphadenopathy.   Reproductive: Prior hysterectomy.   Other: No bowel containing hernia. No free air. No abdominopelvic ascites. There are multiple intraperitoneal calcifications notable along the inferior edge of the right hepatic lobe (series 2, image 40 2-44), and in the right lower quadrant (series 2, images 56-64). These are favored represent dropped gallstones. No evidence of associated inflammation or abscess.   Musculoskeletal: No acute osseous abnormality. No suspicious lytic or blastic lesions. Multilevel degenerative changes of the spine. Bilateral hip osteoarthritis.   IMPRESSION: No acute abdominopelvic abnormality.   Prior colectomy with right lower quadrant ileostomy. No bowel containing parastomal hernia. No evidence of bowel obstruction.   Ill-defined hypoattenuation within the right and left hepatic lobes suggestive of fatty infiltration. This can be confirmed with outpatient, nonemergent MRI.   Nonobstructive 8 mm stone in the left proximal ureter. No hydroureteronephrosis. Probable additional nonobstructive left lower pole renal stones. A stone of this size in the ureter would be expected to be obstructive, thus given no hydroureteronephrosis this could reflect a poorly functioning left kidney. Correlate with metabolic panel.   Prior cholecystectomy. Multiple intraperitoneal calcifications along the inferior edge of the right hepatic lobe and right lower quadrant, favored to represent dropped gallstones.     Electronically Signed   By: Maurine Simmering M.D.   On: 02/23/2021 08:26   I have personally  reviewed the images and agree with radiologist interpretation.  There were reviewed in the room with the patient was also able to review her images.  Assessment & Plan:   Left proximal ureteral stone  Very large 16 x 8 mm left proximal ureteral stone without hydronephrosis, likely chronic.  Asymptomatic.  We discussed various treatment options for urolithiasis including observation with or without medical expulsive therapy, shockwave lithotripsy (SWL), ureteroscopy and laser lithotripsy with stent placement, and percutaneous nephrolithotomy.   We discussed that management is based on stone size, location, density, patient co-morbidities, and patient preference.    Stones <11m in size have a >80% spontaneous passage rate. Data surrounding the use of tamsulosin for medical expulsive therapy is controversial, but meta analyses suggests it is most efficacious for distal stones between 5-125min size. Possible side effects include dizziness/lightheadedness, and retrograde ejaculation.   SWL has a lower stone free rate in a single procedure, but also a lower complication rate compared to ureteroscopy and avoids a stent and associated stent related symptoms. Possible complications include renal hematoma, steinstrasse, and need for additional treatment. We discussed the role of his increased skin to stone distance can lead to decreased efficacy with shockwave lithotripsy.   Ureteroscopy with laser lithotripsy and stent placement has a higher stone free rate than SWL in a single procedure, however increased complication rate including possible infection, ureteral injury, bleeding, and stent related morbidity. Common stent related symptoms include dysuria, urgency/frequency, and flank pain.  After an extensive discussion of the risks and  benefits of the above treatment options, the patient would like to proceed with ureteroscopy - Risks and benefits of ureteroscopy were reviewed including but not limited to  infection, bleeding, pain, ureteral injury which could require open surgery versus prolonged indwelling if ureteral perforation occurs, persistent stone disease, requirement for staged procedure, possible stent, and global anesthesia risks.  - Discussed with patient that if they expereince symptoms such as severe pain , nausea and vomiting in the interim  advise to go to the emergency room  - Urinalysis today is suspicious.  Urine sent for pre-operative culture.  - Will treat her with Keflex.   2.  Acute on chronic kidney injury Rising creatinine over the past several years may or may not be related to stone especially in the absence of hydronephrosis.  We will continue to monitor creatinine after the stone is treated.  Return to OR for ureteroscopy.   Conley Rolls as a Education administrator for Hollice Espy, MD.,have documented all relevant documentation on the behalf of Hollice Espy, MD,as directed by  Hollice Espy, MD while in the presence of Hollice Espy, MD.  I have reviewed the above documentation for accuracy and completeness, and I agree with the above.   Hollice Espy, MD   [facility] [address] [place], [place] [address] [phone]

## 2021-03-18 ENCOUNTER — Ambulatory Visit: Payer: Medicare PPO | Admitting: Urology

## 2021-03-18 ENCOUNTER — Other Ambulatory Visit: Payer: Self-pay

## 2021-03-18 ENCOUNTER — Other Ambulatory Visit
Admission: RE | Admit: 2021-03-18 | Discharge: 2021-03-18 | Disposition: A | Discharge status: Home / Self Care | Payer: Medicare PPO | Attending: Urology | Admitting: Urology

## 2021-03-18 ENCOUNTER — Other Ambulatory Visit: Payer: Self-pay | Admitting: Urology

## 2021-03-18 ENCOUNTER — Encounter: Payer: Self-pay | Admitting: Urology

## 2021-03-18 ENCOUNTER — Telehealth: Payer: Self-pay

## 2021-03-18 VITALS — BP 130/80 | HR 81 | Ht 65.5 in | Wt 240.0 lb

## 2021-03-18 DIAGNOSIS — N201 Calculus of ureter: Secondary | ICD-10-CM

## 2021-03-18 LAB — URINALYSIS, COMPLETE (UACMP) WITH MICROSCOPIC
Bilirubin Urine: NEGATIVE
Glucose, UA: NEGATIVE mg/dL
Hgb urine dipstick: NEGATIVE
Ketones, ur: NEGATIVE mg/dL
Nitrite: NEGATIVE
Protein, ur: 100 mg/dL — AB
Specific Gravity, Urine: 1.025 (ref 1.005–1.030)
WBC, UA: >50 WBC/hpf (ref 0–5)
pH: 6 (ref 5.0–8.0)

## 2021-03-18 MED ORDER — CEPHALEXIN 500 MG PO CAPS: 500.0000 mg | ORAL_CAPSULE | Freq: Three times a day (TID) | ORAL | 0 refills | Status: AC

## 2021-03-18 MED ORDER — FLUCONAZOLE 150 MG PO TABS: 150.0000 mg | ORAL_TABLET | Freq: Once | ORAL | 1 refills | Status: AC

## 2021-03-18 NOTE — Progress Notes (Signed)
Surgical Physician Order Form [facility]  * Scheduling expectation :  Next Available *Length of Case:   *Clearance needed: no  *Anticoagulation Instructions: Hold all anticoagulants  *Aspirin Instructions: Hold Aspirin  *Post-op visit Date/Instructions:  1 month with RUS prior  *Diagnosis: Left Ureteral Stone  *Procedure: left Ureteroscopy w/laser lithotripsy & stent placement (35521)   Additional orders: N/A  -Admit type: OUTpatient  -Anesthesia: General  -VTE Prophylaxis Standing Order SCDs       Other:   -Standing Lab Orders Per Anesthesia    Lab other: None  -Standing Test orders EKG/Chest x-ray per Anesthesia       Test other:   - Medications:  Ancef 2gm IV  -Other orders:  N/A

## 2021-03-18 NOTE — Patient Instructions (Signed)
Ureteroscopy Ureteroscopy is a procedure to check for and treat problems inside part of the urinary tract. In this procedure, a thin, flexible tube with a light at the end (ureteroscope) is used to look at the inside of the kidneys and the ureters. The ureters are the tubes that carry urine from the kidneys to the bladder. The ureteroscope is inserted into one or both of the ureters. You may need this procedure if you have frequent urinary tract infections (UTIs), blood in your urine, or a stone in one of your ureters. A ureteroscopy can be done: To find the cause of urine blockage in a ureter and to evaluate other abnormalities inside the ureters or kidneys. To remove stones. To remove or treat growths of tissue (polyps), abnormal tissue, and some types of tumors. To remove a tissue sample and check it for disease under a microscope (biopsy). Tell a health care provider about: Any allergies you have. All medicines you are taking, including vitamins, herbs, eye drops, creams, and over-the-counter medicines. Any problems you or family members have had with anesthetic medicines. Any blood disorders you have. Any surgeries you have had. Any medical conditions you have. Whether you are pregnant or may be pregnant. What are the risks? Generally, this is a safe procedure. However, problems may occur, including: Bleeding. Infection. Allergic reactions to medicines. Scarring that narrows the ureter (stricture). Creating a hole in the ureter (perforation). What happens before the procedure? Staying hydrated Follow instructions from your health care provider about hydration, which may include: Up to 2 hours before the procedure - you may continue to drink clear liquids, such as water, clear fruit juice, black coffee, and plain tea.  Eating and drinking restrictions Follow instructions from your health care provider about eating and drinking, which may include: 8 hours before the procedure - stop  eating heavy meals or foods, such as meat, fried foods, or fatty foods. 6 hours before the procedure - stop eating light meals or foods, such as toast or cereal. 6 hours before the procedure - stop drinking milk or drinks that contain milk. 2 hours before the procedure - stop drinking clear liquids. Medicines Ask your health care provider about: Changing or stopping your regular medicines. This is especially important if you are taking diabetes medicines or blood thinners. Taking medicines such as aspirin and ibuprofen. These medicines can thin your blood. Do not take these medicines unless your health care provider tells you to take them. Taking over-the-counter medicines, vitamins, herbs, and supplements. General instructions Do not use any products that contain nicotine or tobacco for at least 4 weeks before the procedure. These products include cigarettes, e-cigarettes, and chewing tobacco. If you need help quitting, ask your health care provider. You may have a urine sample taken to check for infection. Plan to have someone take you home from the hospital or clinic. If you will be going home right after the procedure, plan to have someone with you for 24 hours. Ask your health care provider what steps will be taken to help prevent infection. These may include: Washing skin with a germ-killing soap. Receiving antibiotic medicine. What happens during the procedure?  An IV will be inserted into one of your veins. You will be given one or more of the following: A medicine to help you relax (sedative). A medicine to make you fall asleep (general anesthetic). A medicine that is injected into your spine to numb the area below and slightly above the injection site (spinal anesthetic). The  part of your body that drains urine from your bladder (urethra) will be cleaned with a germ-killing solution. The ureteroscope will be passed through your urethra into your bladder. A salt-water solution will  be sent through the ureteroscope to fill your bladder. This will help the health care provider see the openings of your ureters more clearly. The ureteroscope will be passed into your ureter. If a growth is found, a biopsy may be done. If a stone is found, it may be removed through the ureteroscope, or the stone may be broken up using a laser, shock waves, or electrical energy. In some cases, if the ureter is too small, a tube may be inserted that keeps the ureter open (ureteral stent). The stent may be left in place for 1 or 2 weeks to keep the ureter open, and then the ureteroscopy procedure will be done. The scope will be removed, and your bladder will be emptied. The procedure may vary among health care providers and hospitals. What can I expect after the procedure? After your procedure, it is common to have: Your blood pressure, heart rate, breathing rate, and blood oxygen level monitored until you leave the hospital or clinic. A burning sensation when you urinate. You may be asked to urinate. Blood in your urine. Mild discomfort in your bladder area or kidney area when urinating. A need to urinate more often or urgently. Follow these instructions at home: Medicines Take over-the-counter and prescription medicines only as told by your health care provider. If you were prescribed an antibiotic medicine, take it as told by your health care provider. Do not stop taking the antibiotic even if you start to feel better. General instructions  If you were given a sedative during the procedure, it can affect you for several hours. Do not drive or operate machinery until your health care provider says that it is safe. To relieve burning, take a warm bath or hold a warm washcloth over your groin. Drink enough fluid to keep your urine pale yellow. Drink two 8-ounce (237 mL) glasses of water every hour for the first 2 hours after you get home. Continue to drink water often at home. You can eat what  you normally do. Keep all follow-up visits as told by your health care provider. This is important. If you had a ureteral stent placed, ask your health care provider when you need to return to have it removed. Contact a health care provider if you have: Chills or a fever. Burning pain for longer than 24 hours after the procedure. Blood in your urine for longer than 24 hours after the procedure. Get help right away if you have: Large amounts of blood in your urine. Blood clots in your urine. Severe pain. Chest pain or trouble breathing. The feeling of a full bladder and you are unable to urinate. These symptoms may represent a serious problem that is an emergency. Do not wait to see if the symptoms will go away. Get medical help right away. Call your local emergency services (911 in the [place] Summary Ureteroscopy is a procedure to check for and treat problems inside part of the urinary tract. In this procedure, a thin, flexible tube with a light at the end (ureteroscope) is used to look at the inside of the kidneys and the ureters. You may need this procedure if you have frequent urinary tract infections (UTIs), blood in your urine, or a stone in a ureter. This information is not intended to replace advice given to  you by your health care provider. Make sure you discuss any questions you have with your health care provider. Document Revised: 09/22/2020 Document Reviewed: 10/16/2018 Elsevier Patient Education  [employer].

## 2021-03-18 NOTE — Telephone Encounter (Signed)
Pt calls triage line and requests diflucan x 1 tablet to take after completing antibiotics. Please advise.

## 2021-03-18 NOTE — Telephone Encounter (Signed)
Patient advised, sent to pharmacy

## 2021-03-18 NOTE — Telephone Encounter (Signed)
Diflucan 150 mg x 1 with 1 refill

## 2021-03-19 LAB — URINE CULTURE: Culture: 40000 — AB

## 2021-03-21 ENCOUNTER — Telehealth: Payer: Self-pay | Admitting: *Deleted

## 2021-03-21 ENCOUNTER — Telehealth: Payer: Self-pay

## 2021-03-21 NOTE — Addendum Note (Signed)
Addended by: Gerald Leitz A on: 03/21/2021 02:38 PM   Modules accepted: Orders

## 2021-03-21 NOTE — Telephone Encounter (Addendum)
Patient informed, voiced understanding ----- Message from Hollice Espy, MD sent at 03/21/2021 12:34 PM EST ----- Urine culture is growing vaginal flora meeting the urine specimen was likely contaminated.  If Keflex is tolerated, go ahead and treat it but in light of her obstructing ureteral stone as a precaution.  Hollice Espy, MD

## 2021-03-21 NOTE — Progress Notes (Signed)
[facility] Posting Form   Surgery Date/Time: Date: 04/11/2021  Surgeon: Dr. Hollice Espy, MD  Surgery Location: Day Surgery  Inpt ( No  )   Outpt (Yes)   Obs ( No  )   Diagnosis: N20.1 Left Ureteral Stone  -CPT: [id number]  Surgery: Left Ureteroscopy with laser lithotripsy and stent placement  Stop Anticoagulations: Yes  Cardiac/Medical/Pulmonary Clearance needed: no  *Orders entered into EPIC  Date: 03/21/21   *Case booked in [place]  Date: 03/21/21  *Notified pt of Surgery: Date: 03/21/21  PRE-OP UA & CX: no  *Placed into Prior Authorization Work Que Date: 03/21/21   Assistant/laser/rep:No

## 2021-03-21 NOTE — Telephone Encounter (Signed)
I spoke with Jamie [name]. We have discussed possible surgery dates and Monday March 20th, 2023 was agreed upon by all parties. Patient given information about surgery date, what to expect pre-operatively and post operatively.   We discussed that a Pre-Admission Testing office will be calling to set up the pre-op visit that will take place prior to surgery, and that these appointments are typically done over the phone with a Pre-Admissions RN.   Informed patient that our office will communicate any additional care to be provided after surgery. Patients questions or concerns were discussed during our call. Advised to call our office should there be any additional information, questions or concerns that arise. Patient verbalized understanding.

## 2021-03-22 DIAGNOSIS — N1832 Chronic kidney disease, stage 3b: Secondary | ICD-10-CM | POA: Diagnosis not present

## 2021-03-22 DIAGNOSIS — R809 Proteinuria, unspecified: Secondary | ICD-10-CM | POA: Diagnosis not present

## 2021-03-22 DIAGNOSIS — E1122 Type 2 diabetes mellitus with diabetic chronic kidney disease: Secondary | ICD-10-CM | POA: Diagnosis not present

## 2021-03-22 DIAGNOSIS — I10 Essential (primary) hypertension: Secondary | ICD-10-CM | POA: Diagnosis not present

## 2021-04-04 ENCOUNTER — Other Ambulatory Visit
Admission: RE | Admit: 2021-04-04 | Discharge: 2021-04-04 | Disposition: A | Discharge status: Home / Self Care | Payer: Medicare PPO | Source: Ambulatory Visit | Attending: Obstetrics and Gynecology | Admitting: Obstetrics and Gynecology

## 2021-04-04 ENCOUNTER — Other Ambulatory Visit: Payer: Self-pay

## 2021-04-04 DIAGNOSIS — Z01818 Encounter for other preprocedural examination: Secondary | ICD-10-CM | POA: Insufficient documentation

## 2021-04-04 DIAGNOSIS — I10 Essential (primary) hypertension: Secondary | ICD-10-CM | POA: Diagnosis not present

## 2021-04-04 DIAGNOSIS — Z01810 Encounter for preprocedural cardiovascular examination: Secondary | ICD-10-CM | POA: Diagnosis not present

## 2021-04-04 HISTORY — DX: Anemia, unspecified: D64.9

## 2021-04-04 HISTORY — DX: Dyspnea, unspecified: R06.00

## 2021-04-04 HISTORY — DX: Personal history of urinary calculi: Z87.442

## 2021-04-04 HISTORY — DX: Pneumonia, unspecified organism: J18.9

## 2021-04-04 HISTORY — DX: Type 2 diabetes mellitus without complications: E11.9

## 2021-04-04 HISTORY — DX: Gastro-esophageal reflux disease without esophagitis: K21.9

## 2021-04-04 HISTORY — DX: Chronic kidney disease, unspecified: N18.9

## 2021-04-04 NOTE — Patient Instructions (Addendum)
Your procedure is scheduled on: 04/11/21 - Monday ?Report to the Registration Desk on the 1st floor of the [facility]. ?To find out your arrival time, please call [phone] between 1PM - 3PM on: 04/08/21 - Friday  ? ?REMEMBER: ?Instructions that are not followed completely may result in serious medical risk, up to and including death; or upon the discretion of your surgeon and anesthesiologist your surgery may need to be rescheduled. ? ?Do not eat food or drink any fluids after midnight the night before surgery.  ?No gum chewing, lozengers or hard candies. ? ?TAKE THESE MEDICATIONS THE MORNING OF SURGERY WITH A SIP OF WATER: ? ?- esomeprazole (NEXIUM) 20 MG capsule, (take one the night before and one on the morning of surgery - helps to prevent nausea after surgery.) ?- metoprolol tartrate (LOPRESSOR) 25 MG tablet ?- atorvastatin (LIPITOR) 10 MG tablet ? ?Use albuterol (VENTOLIN HFA) 108 (90 Base) MCG/ACT inhaler on the day of surgery and bring to the hospital. ? ?One week prior to surgery: ?Stop Anti-inflammatories (NSAIDS) such as Advil, Aleve, Ibuprofen, Motrin, Naproxen, Naprosyn and Aspirin based products such as Excedrin, Goodys Powder, BC Powder. ? ?Stop ANY OVER THE COUNTER supplements until after surgery.(VITAMIN D3) , Multiple Vitamin . ? ?You may however, continue to take Tylenol if needed for pain up until the day of surgery. ? ?No Alcohol for 24 hours before or after surgery. ? ?No Smoking including e-cigarettes for 24 hours prior to surgery.  ?No chewable tobacco products for at least 6 hours prior to surgery.  ?No nicotine patches on the day of surgery. ? ?Do not use any "recreational" drugs for at least a week prior to your surgery.  ?Please be advised that the combination of cocaine and anesthesia may have negative outcomes, up to and including death. ?If you test positive for cocaine, your surgery will be cancelled. ? ?On the morning of surgery brush your teeth with toothpaste and water, you  may rinse your mouth with mouthwash if you wish. ?Do not swallow any toothpaste or mouthwash. ? ?Do not wear jewelry, make-up, hairpins, clips or nail polish. ? ?Do not wear lotions, powders, or perfumes.  ? ?Do not shave body from the neck down 48 hours prior to surgery just in case you cut yourself which could leave a site for infection.  ?Also, freshly shaved skin may become irritated if using the CHG soap. ? ?Contact lenses, hearing aids and dentures may not be worn into surgery. ? ?You will need your BIPAP if you are spending the night. ? ?Do not bring valuables to the hospital. [facility] is not responsible for any missing/lost belongings or valuables.  ? ?Notify your doctor if there is any change in your medical condition (cold, fever, infection). ? ?Wear comfortable clothing (specific to your surgery type) to the hospital. ? ?After surgery, you can help prevent lung complications by doing breathing exercises.  ?Take deep breaths and cough every 1-2 hours. Your doctor may order a device called an Incentive Spirometer to help you take deep breaths. ?When coughing or sneezing, hold a pillow firmly against your incision with both hands. This is called ?splinting.? Doing this helps protect your incision. It also decreases belly discomfort. ? ?If you are being admitted to the hospital overnight, leave your suitcase in the car. ?After surgery it may be brought to your room. ? ?If you are being discharged the day of surgery, you will not be allowed to drive home. ?You will need a responsible  adult (18 years or older) to drive you home and stay with you that night.  ? ?If you are taking public transportation, you will need to have a responsible adult (18 years or older) with you. ?Please confirm with your physician that it is acceptable to use public transportation.  ? ?Please call the [facility]. at [phone] if you have any questions about these instructions. ? ?Surgery Visitation  Policy: ? ?Patients undergoing a surgery or procedure may have one family member or support person with them as long as that person is not COVID-19 positive or experiencing its symptoms.  ?That person may remain in the waiting area during the procedure and may rotate out with other people. ? ?Inpatient Visitation:   ? ?Visiting hours are 7 a.m. to 8 p.m. ?Up to two visitors ages 16+ are allowed at one time in a patient room. The visitors may rotate out with other people during the day. Visitors must check out when they leave, or other visitors will not be allowed. One designated support person may remain overnight. ?The visitor must pass COVID-19 screenings, use hand sanitizer when entering and exiting the patient?s room and wear a mask at all times, including in the patient?s room. ?Patients must also wear a mask when staff or their visitor are in the room. ?Masking is required regardless of vaccination status.  ?

## 2021-04-09 ENCOUNTER — Other Ambulatory Visit: Payer: Self-pay | Admitting: Family Medicine

## 2021-04-10 MED ORDER — SODIUM CHLORIDE 0.9 % IV SOLN: INTRAVENOUS | Status: DC

## 2021-04-10 MED ORDER — CHLORHEXIDINE GLUCONATE 0.12 % MT SOLN
15.0000 mL | Freq: Once | OROMUCOSAL | Status: AC
Start: 2021-04-10 — End: 2021-04-11

## 2021-04-10 MED ORDER — CEFAZOLIN SODIUM-DEXTROSE 2-4 GM/100ML-% IV SOLN
2.0000 g | INTRAVENOUS | Status: AC
  Administered 2021-04-11: 2 g via INTRAVENOUS

## 2021-04-10 MED ORDER — ORAL CARE MOUTH RINSE: 15.0000 mL | Freq: Once | OROMUCOSAL | Status: AC

## 2021-04-11 ENCOUNTER — Ambulatory Visit
Admission: RE | Admit: 2021-04-11 | Discharge: 2021-04-11 | Disposition: A | Discharge status: Home / Self Care | Payer: Medicare PPO | Attending: Urology | Admitting: Urology

## 2021-04-11 ENCOUNTER — Other Ambulatory Visit: Payer: Self-pay

## 2021-04-11 ENCOUNTER — Encounter: Payer: Self-pay | Admitting: Urology

## 2021-04-11 ENCOUNTER — Ambulatory Visit: Payer: Medicare PPO

## 2021-04-11 ENCOUNTER — Ambulatory Visit: Payer: Medicare PPO | Admitting: Urgent Care

## 2021-04-11 ENCOUNTER — Encounter
Admission: RE | Disposition: A | Discharge status: Home / Self Care | Payer: Self-pay | Source: Home / Self Care | Attending: Urology

## 2021-04-11 DIAGNOSIS — N1832 Chronic kidney disease, stage 3b: Secondary | ICD-10-CM | POA: Diagnosis not present

## 2021-04-11 DIAGNOSIS — Z87440 Personal history of urinary (tract) infections: Secondary | ICD-10-CM | POA: Diagnosis not present

## 2021-04-11 DIAGNOSIS — N179 Acute kidney failure, unspecified: Secondary | ICD-10-CM | POA: Insufficient documentation

## 2021-04-11 DIAGNOSIS — I129 Hypertensive chronic kidney disease with stage 1 through stage 4 chronic kidney disease, or unspecified chronic kidney disease: Secondary | ICD-10-CM | POA: Diagnosis not present

## 2021-04-11 DIAGNOSIS — E1122 Type 2 diabetes mellitus with diabetic chronic kidney disease: Secondary | ICD-10-CM | POA: Insufficient documentation

## 2021-04-11 DIAGNOSIS — Z932 Ileostomy status: Secondary | ICD-10-CM | POA: Diagnosis not present

## 2021-04-11 DIAGNOSIS — K219 Gastro-esophageal reflux disease without esophagitis: Secondary | ICD-10-CM | POA: Diagnosis not present

## 2021-04-11 DIAGNOSIS — N201 Calculus of ureter: Secondary | ICD-10-CM | POA: Insufficient documentation

## 2021-04-11 HISTORY — PX: CYSTOSCOPY/URETEROSCOPY/HOLMIUM LASER/STENT PLACEMENT: SHX6546

## 2021-04-11 LAB — GLUCOSE, CAPILLARY
Glucose-Capillary: 164 mg/dL — ABNORMAL HIGH (ref 70–99)
Glucose-Capillary: 167 mg/dL — ABNORMAL HIGH (ref 70–99)

## 2021-04-11 SURGERY — CYSTOSCOPY/URETEROSCOPY/HOLMIUM LASER/STENT PLACEMENT
Anesthesia: General | Laterality: Left

## 2021-04-11 MED ORDER — OXYCODONE HCL 5 MG PO TABS
ORAL_TABLET | ORAL | Status: AC
  Filled 2021-04-11: qty 1

## 2021-04-11 MED ORDER — IOHEXOL 180 MG/ML  SOLN
INTRAMUSCULAR | Status: DC | PRN
  Administered 2021-04-11: 10 mL

## 2021-04-11 MED ORDER — OXYBUTYNIN CHLORIDE 5 MG PO TABS: 5.0000 mg | ORAL_TABLET | Freq: Three times a day (TID) | ORAL | 0 refills | Status: DC | PRN

## 2021-04-11 MED ORDER — EPHEDRINE 5 MG/ML INJ
INTRAVENOUS | Status: AC
Start: 2021-04-11 — End: ?
  Filled 2021-04-11: qty 5

## 2021-04-11 MED ORDER — LIDOCAINE HCL (CARDIAC) PF 100 MG/5ML IV SOSY
PREFILLED_SYRINGE | INTRAVENOUS | Status: DC | PRN
  Administered 2021-04-11: 100 mg via INTRAVENOUS

## 2021-04-11 MED ORDER — TAMSULOSIN HCL 0.4 MG PO CAPS: 0.4000 mg | ORAL_CAPSULE | Freq: Every day | ORAL | 0 refills | Status: DC

## 2021-04-11 MED ORDER — PHENYLEPHRINE 40 MCG/ML (10ML) SYRINGE FOR IV PUSH (FOR BLOOD PRESSURE SUPPORT)
PREFILLED_SYRINGE | INTRAVENOUS | Status: DC | PRN
  Administered 2021-04-11: 80 ug via INTRAVENOUS

## 2021-04-11 MED ORDER — MIDAZOLAM HCL 2 MG/2ML IJ SOLN
INTRAMUSCULAR | Status: DC | PRN
  Administered 2021-04-11: 2 mg via INTRAVENOUS

## 2021-04-11 MED ORDER — OXYCODONE HCL 5 MG PO TABS
5.0000 mg | ORAL_TABLET | Freq: Once | ORAL | Status: AC
  Administered 2021-04-11: 5 mg via ORAL

## 2021-04-11 MED ORDER — ACETAMINOPHEN 10 MG/ML IV SOLN
INTRAVENOUS | Status: DC | PRN
  Administered 2021-04-11: 1000 mg via INTRAVENOUS

## 2021-04-11 MED ORDER — FENTANYL CITRATE (PF) 100 MCG/2ML IJ SOLN
INTRAMUSCULAR | Status: AC
  Filled 2021-04-11: qty 2

## 2021-04-11 MED ORDER — DEXAMETHASONE SODIUM PHOSPHATE 10 MG/ML IJ SOLN
INTRAMUSCULAR | Status: DC | PRN
Start: 2021-04-11 — End: 2021-04-11
  Administered 2021-04-11: 5 mg via INTRAVENOUS

## 2021-04-11 MED ORDER — HYDROCODONE-ACETAMINOPHEN 5-325 MG PO TABS: 1.0000 | ORAL_TABLET | Freq: Four times a day (QID) | ORAL | 0 refills | Status: DC | PRN

## 2021-04-11 MED ORDER — ROCURONIUM BROMIDE 100 MG/10ML IV SOLN
INTRAVENOUS | Status: DC | PRN
  Administered 2021-04-11: 40 mg via INTRAVENOUS
  Administered 2021-04-11: 10 mg via INTRAVENOUS

## 2021-04-11 MED ORDER — GLYCOPYRROLATE 0.2 MG/ML IJ SOLN
INTRAMUSCULAR | Status: DC | PRN
  Administered 2021-04-11: .2 mg via INTRAVENOUS

## 2021-04-11 MED ORDER — FENTANYL CITRATE (PF) 100 MCG/2ML IJ SOLN: 25.0000 ug | INTRAMUSCULAR | Status: DC | PRN

## 2021-04-11 MED ORDER — ONDANSETRON HCL 4 MG/2ML IJ SOLN
INTRAMUSCULAR | Status: DC | PRN
  Administered 2021-04-11: 4 mg via INTRAVENOUS

## 2021-04-11 MED ORDER — SUCCINYLCHOLINE CHLORIDE 200 MG/10ML IV SOSY
PREFILLED_SYRINGE | INTRAVENOUS | Status: DC | PRN
  Administered 2021-04-11: 100 mg via INTRAVENOUS

## 2021-04-11 MED ORDER — CEFAZOLIN SODIUM-DEXTROSE 2-4 GM/100ML-% IV SOLN
INTRAVENOUS | Status: AC
  Filled 2021-04-11: qty 100

## 2021-04-11 MED ORDER — FENTANYL CITRATE (PF) 100 MCG/2ML IJ SOLN
INTRAMUSCULAR | Status: DC | PRN
  Administered 2021-04-11: 50 ug via INTRAVENOUS

## 2021-04-11 MED ORDER — CHLORHEXIDINE GLUCONATE 0.12 % MT SOLN
OROMUCOSAL | Status: AC
  Administered 2021-04-11: 15 mL via OROMUCOSAL
  Filled 2021-04-11: qty 15

## 2021-04-11 MED ORDER — EPHEDRINE SULFATE (PRESSORS) 50 MG/ML IJ SOLN
INTRAMUSCULAR | Status: DC | PRN
  Administered 2021-04-11: 5 mg via INTRAVENOUS

## 2021-04-11 MED ORDER — ONDANSETRON HCL 4 MG/2ML IJ SOLN: 4.0000 mg | Freq: Once | INTRAMUSCULAR | Status: DC | PRN

## 2021-04-11 MED ORDER — PROPOFOL 10 MG/ML IV BOLUS
INTRAVENOUS | Status: DC | PRN
  Administered 2021-04-11: 200 mg via INTRAVENOUS

## 2021-04-11 MED ORDER — SUGAMMADEX SODIUM 500 MG/5ML IV SOLN
INTRAVENOUS | Status: DC | PRN
Start: 2021-04-11 — End: 2021-04-11
  Administered 2021-04-11: 400 mg via INTRAVENOUS

## 2021-04-11 MED ORDER — MIDAZOLAM HCL 2 MG/2ML IJ SOLN
INTRAMUSCULAR | Status: AC
  Filled 2021-04-11: qty 2

## 2021-04-11 SURGICAL SUPPLY — 34 items
ADH LQ OCL WTPRF AMP STRL LF (MISCELLANEOUS)
ADHESIVE MASTISOL STRL (MISCELLANEOUS) IMPLANT
BAG DRAIN CYSTO-URO LG1000N (MISCELLANEOUS) ×2 IMPLANT
BASKET ZERO TIP 1.9FR (BASKET) IMPLANT
BRUSH SCRUB EZ 1% IODOPHOR (MISCELLANEOUS) ×2 IMPLANT
BSKT STON RTRVL ZERO TP 1.9FR (BASKET)
CATH URET FLEX-TIP 2 LUMEN 10F (CATHETERS) IMPLANT
CATH URETL OPEN 5X70 (CATHETERS) ×2 IMPLANT
CNTNR SPEC 2.5X3XGRAD LEK (MISCELLANEOUS)
CONT SPEC 4OZ STER OR WHT (MISCELLANEOUS)
CONT SPEC 4OZ STRL OR WHT (MISCELLANEOUS)
CONTAINER SPEC 2.5X3XGRAD LEK (MISCELLANEOUS) IMPLANT
DRAPE UTILITY 15X26 TOWEL STRL (DRAPES) ×2 IMPLANT
DRSG TEGADERM 2-3/8X2-3/4 SM (GAUZE/BANDAGES/DRESSINGS) IMPLANT
GAUZE 4X4 16PLY ~~LOC~~+RFID DBL (SPONGE) ×4 IMPLANT
GLOVE SURG ENC MOIS LTX SZ6.5 (GLOVE) ×2 IMPLANT
GOWN STRL REUS W/ TWL LRG LVL3 (GOWN DISPOSABLE) ×2 IMPLANT
GOWN STRL REUS W/TWL LRG LVL3 (GOWN DISPOSABLE) ×4
GUIDEWIRE GREEN .038 145CM (MISCELLANEOUS) IMPLANT
GUIDEWIRE STR DUAL SENSOR (WIRE) ×2 IMPLANT
GUIDEWIRE SUPER STIFF .035X180 (WIRE) ×1 IMPLANT
INFUSOR MANOMETER BAG 3000ML (MISCELLANEOUS) ×1 IMPLANT
IV NS IRRIG 3000ML ARTHROMATIC (IV SOLUTION) ×2 IMPLANT
KIT TURNOVER CYSTO (KITS) ×2 IMPLANT
MANIFOLD NEPTUNE II (INSTRUMENTS) ×2 IMPLANT
PACK CYSTO AR (MISCELLANEOUS) ×2 IMPLANT
SET CYSTO W/LG BORE CLAMP LF (SET/KITS/TRAYS/PACK) ×2 IMPLANT
SHEATH URETERAL 12FRX35CM (MISCELLANEOUS) ×1 IMPLANT
STENT URET 6FRX24 CONTOUR (STENTS) IMPLANT
STENT URET 6FRX26 CONTOUR (STENTS) IMPLANT
SURGILUBE 2OZ TUBE FLIPTOP (MISCELLANEOUS) ×2 IMPLANT
TRACTIP FLEXIVA PULSE ID 200 (Laser) ×2 IMPLANT
WATER STERILE IRR 1000ML POUR (IV SOLUTION) ×2 IMPLANT
WATER STERILE IRR 500ML POUR (IV SOLUTION) ×2 IMPLANT

## 2021-04-11 NOTE — Transfer of Care (Signed)
Immediate Anesthesia Transfer of Care Note ? ?Patient: Jamie [name] ? ?Procedure(s) Performed: CYSTOSCOPY/URETEROSCOPY/HOLMIUM LASER/STENT PLACEMENT (Left) ? ?Patient Location: PACU ? ?Anesthesia Type:General ? ?Level of Consciousness: awake, drowsy and patient cooperative ? ?Airway & Oxygen Therapy: Patient Spontanous Breathing and Patient connected to face mask oxygen ? ?Post-op Assessment: Report given to RN and Post -op Vital signs reviewed and stable ? ?Post vital signs: Reviewed and stable ? ?Last Vitals:  ?Vitals Value Taken Time  ?BP    ?Temp    ?Pulse 100 04/11/21 1238  ?Resp 21 04/11/21 1238  ?SpO2 100 % 04/11/21 1238  ?Vitals shown include unvalidated device data. ? ?Last Pain:  ?Vitals:  ? 04/11/21 1109  ?TempSrc: Oral  ?PainSc: 0-No pain  ?   ? ?  ? ?Complications: No notable events documented. ?

## 2021-04-11 NOTE — Anesthesia Preprocedure Evaluation (Signed)
Anesthesia Evaluation  ?Patient identified by MRN, date of birth, ID band ?Patient awake ? ? ? ?Reviewed: ?Allergy & Precautions, H&P , NPO status , Patient's Chart, lab work & pertinent test results, reviewed documented beta blocker date and time  ? ?Airway ?Mallampati: III ? ?TM Distance: >3 FB ?Neck ROM: full ? ? ? Dental ? ?(+) Teeth Intact ?  ?Pulmonary ?neg pulmonary ROS, shortness of breath and with exertion, sleep apnea and Continuous Positive Airway Pressure Ventilation , pneumonia, resolved, Patient abstained from smoking.,  ?  ?Pulmonary exam normal ? ? ? ? ? ? ? Cardiovascular ?Exercise Tolerance: Poor ?hypertension, On Medications ?negative cardio ROS ?Normal cardiovascular exam ?Rhythm:regular Rate:Normal ? ? ?  ?Neuro/Psych ?PSYCHIATRIC DISORDERS negative neurological ROS ?   ? GI/Hepatic ?Neg liver ROS, PUD, GERD  Medicated,  ?Endo/Other  ?diabetes, Well Controlled, Type 2, Oral Hypoglycemic AgentsMorbid obesity ? Renal/GU ?Renal disease  ?negative genitourinary ?  ?Musculoskeletal ? ? Abdominal ?  ?Peds ? Hematology ? ?(+) Blood dyscrasia, anemia ,   ?Anesthesia Other Findings ?Past Medical History: ?No date: Anemia ?No date: Arthritis ?    Comment:  generalized ?No date: Bowel obstruction ([place]) ?    Comment:  repeated (? possible adhesions) neg EGD ?No date: Chronic kidney disease ?No date: Colitis ?    Comment:  with colectomy ?No date: Diabetes mellitus without complication ([place]) ?No date: Dyspnea ?    Comment:  covid ?01/23/2006: Gall stones ?07/25/2014: Gastroenteritis ?    Comment:  hosp for IVF  ?No date: GERD (gastroesophageal reflux disease) ?No date: History of kidney stones ?No date: HTN (hypertension) ?No date: Hyperglycemia ?    Comment:  mild-monitors A1C ?No date: Obesity ?No date: Pneumonia ?No date: Rosacea ?No date: Sleep apnea ?    Comment:  CPAP everynight ?Past Surgical History: ?No date: CHOLECYSTECTOMY ?1993: COLECTOMY ?    Comment:  has  ileostomy ?2021: COLON SURGERY ?    Comment:  removed rectum ?08/1991: COLONOSCOPY ?12/06/2011: COLOSTOMY REVISION ?    Comment:  Procedure: COLOSTOMY REVISION;  Surgeon: Leighton Ruff,  ?             MD;  Location: [facility];  Service:  ?             General;  Laterality: Right;  OSTOMY revision ?12/06/2011: EXAMINATION UNDER ANESTHESIA ?    Comment:  Procedure: EXAM UNDER ANESTHESIA;  Surgeon: Elmo Putt  ?             Marcello Moores, MD;  Location: [facility];   ?             Service: General;  Laterality: N/A;  rectal exam under  ?             anesthesia, anal dilation, pouchoscopy  ? ?05/2006: TOTAL ABDOMINAL HYSTERECTOMY ?    Comment:  for abscessed ovaries ?BMI   ? Body Mass Index: 39.33 kg/m?  ?  ? Reproductive/Obstetrics ?negative OB ROS ? ?  ? ? ? ? ? ? ? ? ? ? ? ? ? ?  ?  ? ? ? ? ? ? ? ? ?Anesthesia Physical ?Anesthesia Plan ? ?ASA: 3 ? ?Anesthesia Plan: General ETT  ? ?Post-op Pain Management:   ? ?Induction:  ? ?PONV Risk Score and Plan: 4 or greater ? ?Airway Management Planned:  ? ?Additional Equipment:  ? ?Intra-op Plan:  ? ?Post-operative Plan:  ? ?Informed Consent: I have reviewed the patients History and Physical, chart,  labs and discussed the procedure including the risks, benefits and alternatives for the proposed anesthesia with the patient or authorized representative who has indicated his/her understanding and acceptance.  ? ? ? ?Dental Advisory Given ? ?Plan Discussed with: CRNA ? ?Anesthesia Plan Comments:   ? ? ? ? ? ? ?Anesthesia Quick Evaluation ? ?

## 2021-04-11 NOTE — Anesthesia Procedure Notes (Signed)
Procedure Name: Intubation ?Date/Time: 04/11/2021 11:50 AM ?Performed by: Kelton Pillar, CRNA ?Pre-anesthesia Checklist: Patient identified, Emergency Drugs available, Suction available and Patient being monitored ?Patient Re-evaluated:Patient Re-evaluated prior to induction ?Oxygen Delivery Method: Circle system utilized ?Preoxygenation: Pre-oxygenation with 100% oxygen ?Induction Type: IV induction ?Ventilation: Mask ventilation without difficulty ?Laryngoscope Size: McGraph and 3 ?Grade View: Grade I ?Tube type: Oral ?Tube size: 6.5 mm ?Number of attempts: 1 ?Airway Equipment and Method: Stylet and Oral airway ?Placement Confirmation: ETT inserted through vocal cords under direct vision, positive ETCO2, breath sounds checked- equal and bilateral and CO2 detector ?Secured at: 21 cm ?Tube secured with: Tape ?Dental Injury: Teeth and Oropharynx as per pre-operative assessment  ? ? ? ? ?

## 2021-04-11 NOTE — Discharge Instructions (Addendum)
You have a ureteral stent in place.  This is a tube that extends from your kidney to your bladder.  This may cause urinary bleeding, burning with urination, and urinary frequency.  Please call our office or present to the ED if you develop fevers >101 or pain which is not able to be controlled with oral pain medications.  You may be given either Flomax and/ or ditropan to help with bladder spasms and stent pain in addition to pain medications.   ? ? ?Your stent is on a string.  It is taped to your left inner thigh.  On Friday morning, you may untaped the stent and pulled gently until it is removed in entirety. ? ?[facility] ?[address], [address] ?[place], [place] [address] ?[phone] ? ? ? ? ? ?AMBULATORY SURGERY  ?DISCHARGE INSTRUCTIONS ? ? ?The drugs that you were given will stay in your system until tomorrow so for the next 24 hours you should not: ? ?Drive an automobile ?Make any legal decisions ?Drink any alcoholic beverage ? ? ?You may resume regular meals tomorrow.  Today it is better to start with liquids and gradually work up to solid foods. ? ?You may eat anything you prefer, but it is better to start with liquids, then soup and crackers, and gradually work up to solid foods. ? ? ?Please notify your doctor immediately if you have any unusual bleeding, trouble breathing, redness and pain at the surgery site, drainage, fever, or pain not relieved by medication. ? ? ? ?Additional Instructions: ? ? ? ?Please contact your physician with any problems or Same Day Surgery at [phone], Monday through Friday 6 am to 4 pm, or [facility] at [facility] number at [phone].  ?

## 2021-04-11 NOTE — Interval H&P Note (Signed)
History and Physical Interval Note: ? ?04/11/2021 ?11:40 AM ? ?Jamie [name]  has presented today for surgery, with the diagnosis of Left ureteral Stone.  The various methods of treatment have been discussed with the patient and family. After consideration of risks, benefits and other options for treatment, the patient has consented to  Procedure(s): ?CYSTOSCOPY/URETEROSCOPY/HOLMIUM LASER/STENT PLACEMENT (Left) as a surgical intervention.  The patient's history has been reviewed, patient examined, no change in status, stable for surgery.  I have reviewed the patient's chart and labs.  Questions were answered to the patient's satisfaction.   ? ?RRR ?CTAB ? ?Hollice Espy ? ? ?

## 2021-04-11 NOTE — Op Note (Signed)
Date of procedure: 04/11/21 ? ?Preoperative diagnosis:  ?Left proximal ureteral calculus ? ?Postoperative diagnosis:  ?Same as above ? ?Procedure: ?Left ureteroscopy, laser lithotripsy ?Left retrograde pyelogram ?Left ureteral stent placement ?Interpretation of fluoroscopy less than 30 minutes ? ?Surgeon: Hollice Espy, MD ? ?Anesthesia: General ? ?Complications: None ? ?Intraoperative findings: Large 16 mm left proximal ureteral stone obliterated.  Stone sent for stone analysis.  Left lower pole stone also treated.  Stent placed on tether. ? ?EBL: Minimal ? ?Specimens: Stone fragment ? ?Drains: 6 x 26 French double-J ureteral stent on left, tether left in place ? ?Indication: Jamie [name] is a 66 y.o. patient with large 16 mm left proximal ureteral stone which appeared to be relatively chronic along with smaller left lower stone burden.  After reviewing the management options for treatment, he elected to proceed with the above surgical procedure(s). We have discussed the potential benefits and risks of the procedure, side effects of the proposed treatment, the likelihood of the patient achieving the goals of the procedure, and any potential problems that might occur during the procedure or recuperation. Informed consent has been obtained. ? ?Description of procedure: ? ?The patient was taken to the operating room and general anesthesia was induced.  The patient was placed in the dorsal lithotomy position, prepped and draped in the usual sterile fashion, and preoperative antibiotics were administered. A preoperative time-out was performed.  ? ?A 21 [place] scope was advanced per urethra into the bladder.  Attention was turned to the left side.  On scout imaging, the stone could easily be seen in left proximal ureter.  A sensor wire was then placed up to the level of the kidney bypassing the stone.  This was snapped in place as a safety wire.  I first attempted to advance a 4.5 semirigid ureteroscope up to the  stone however only made it to about the levels of the iliacs and due to anatomic issues, was not able to advance any further.  I then advanced a Super Stiff wire and direct visualization up to the level of the stone was not able to get around it.  I advanced a Lacinda Axon 12/14 French ureteral access sheath using fluoroscopy to advance to just distal to the stone.  The wire and inner sheath were removed I advanced a 8 French flexible digital ureteroscope up to the level of the stone which went easily.  A 242 ?m laser fiber was then used and using dusting settings of 0.3 J and 60 Hz, I fragmented the stone into dust like material.  This was able to be evacuated through the sheath.  There was some retropulsion of 1 small piece which did go up into the kidney which I was able to chase and identify in the left renal pelvis which was further obliterated and irrigated out of the kidney.  I then navigated into the extreme left lower pole or additional stone material was identified and treated.  In the end, there is only very minimal dust remaining.  A final retrograde pyelogram outline the collecting system to ensure that each every calyx was directly visualized.  No extravasation was noted.  I then slowly backed the scope down the length the ureter removing the access sheath along the way.  No additional fragments were identified and there is only minimal edema at the level of the stone.  I elected to place a 6 x 26 French double-J ureteral stent over a wire up to the level of the kidney.  Upon wire withdrawal, there was a full coil noted both within the renal pelvis as well as within the bladder.  The stent string was left attached to the distal coil of the stent which was secured to the patient's left inner thigh using Mastisol and Tegaderm.  She was cleaned and dried, repositioned in supine position, reversed from anesthesia, and taken to the PACU in stable condition. ? ?Plan: We will have her remove her own stent on Friday.   She will follow-up with me in 4 weeks with renal ultrasound prior. ? ?Hollice Espy, M.D. ? ?

## 2021-04-12 ENCOUNTER — Encounter: Payer: Self-pay | Admitting: Urology

## 2021-04-12 ENCOUNTER — Encounter: Payer: Self-pay | Admitting: Family Medicine

## 2021-04-13 NOTE — Anesthesia Postprocedure Evaluation (Signed)
Anesthesia Post Note ? ?Patient: Jamie [name] ? ?Procedure(s) Performed: CYSTOSCOPY/URETEROSCOPY/HOLMIUM LASER/STENT PLACEMENT (Left) ? ?Patient location during evaluation: PACU ?Anesthesia Type: General ?Level of consciousness: awake and alert ?Pain management: pain level controlled ?Vital Signs Assessment: post-procedure vital signs reviewed and stable ?Respiratory status: spontaneous breathing, nonlabored ventilation, respiratory function stable and patient connected to nasal cannula oxygen ?Cardiovascular status: blood pressure returned to baseline and stable ?Postop Assessment: no apparent nausea or vomiting ?Anesthetic complications: no ? ? ?No notable events documented. ? ? ?Last Vitals:  ?Vitals:  ? 04/11/21 1300 04/11/21 1304  ?BP: (!) 119/53 100/61  ?Pulse: 92 83  ?Resp: 18 20  ?Temp: (!) 36.4 ?C (!) 36.2 ?C  ?SpO2: 98% 100%  ?  ?Last Pain:  ?Vitals:  ? 04/12/21 1251  ?TempSrc:   ?PainSc: 2   ? ? ?  ?  ?  ?  ?  ?  ? ?[name] ? ? ? ? ?

## 2021-04-14 LAB — CALCULI, WITH PHOTOGRAPH (CLINICAL LAB)
Calcium Oxalate Dihydrate: 10 %
Calcium Oxalate Monohydrate: 90 %
Weight Calculi: 59 mg

## 2021-04-26 ENCOUNTER — Encounter: Payer: Self-pay | Admitting: Family Medicine

## 2021-04-26 ENCOUNTER — Ambulatory Visit: Payer: Medicare PPO | Admitting: Family Medicine

## 2021-04-26 VITALS — BP 126/70 | HR 73 | Temp 98.0°F | Ht 65.5 in | Wt 248.0 lb

## 2021-04-26 DIAGNOSIS — I10 Essential (primary) hypertension: Secondary | ICD-10-CM | POA: Diagnosis not present

## 2021-04-26 DIAGNOSIS — R9431 Abnormal electrocardiogram [ECG] [EKG]: Secondary | ICD-10-CM | POA: Diagnosis not present

## 2021-04-26 DIAGNOSIS — E785 Hyperlipidemia, unspecified: Secondary | ICD-10-CM | POA: Diagnosis not present

## 2021-04-26 DIAGNOSIS — N184 Chronic kidney disease, stage 4 (severe): Secondary | ICD-10-CM | POA: Diagnosis not present

## 2021-04-26 DIAGNOSIS — E1122 Type 2 diabetes mellitus with diabetic chronic kidney disease: Secondary | ICD-10-CM

## 2021-04-26 DIAGNOSIS — E1169 Type 2 diabetes mellitus with other specified complication: Secondary | ICD-10-CM | POA: Diagnosis not present

## 2021-04-26 LAB — POCT GLYCOSYLATED HEMOGLOBIN (HGB A1C): Hemoglobin A1C: 7.6 % — AB (ref 4.0–5.6)

## 2021-04-26 NOTE — Assessment & Plan Note (Signed)
bp in fair control at this time  ?BP Readings from Last 1 Encounters:  ?04/26/21 126/70  ? ?No changes needed ?Most recent labs reviewed  ?Disc lifstyle change with low sodium diet and exercise  ?Plan to continue losartan 25 mg daily ?Metoprolol 25 mg daily ?

## 2021-04-26 NOTE — Progress Notes (Signed)
? ?Subjective:  ? ? Patient ID: Jamie [name], female    DOB: Dec 10, 1955, 66 y.o.   MRN: [id number] ? ?HPI ?Pt presents for f/u of DM2 ? ?Wt Readings from Last 3 Encounters:  ?04/26/21 248 lb (112.5 kg)  ?04/11/21 240 lb (108.9 kg)  ?03/18/21 240 lb (108.9 kg)  ? ?40.64 kg/m? ? ?Keeping busy ?Had a large kidney stone but does not get pain from it  ?Was treated along with another stone  ?Feeling ok -better in general  ?Weight went up since this all started  ? ?HTN ?bp is stable today  ?No cp or palpitations or headaches or edema  ?No side effects to medicines  ?BP Readings from Last 3 Encounters:  ?04/26/21 126/70  ?04/11/21 100/61  ?03/18/21 130/80  ?  Losartan 25 mg daily  ?Metoprolol 25 mg daily  ? ?Pulse Readings from Last 3 Encounters:  ?04/26/21 73  ?04/11/21 83  ?03/18/21 81  ? ?Lab Results  ?Component Value Date  ? CREATININE 1.79 (H) 02/23/2021  ? BUN 22 02/23/2021  ? NA 136 02/23/2021  ? K 4.2 02/23/2021  ? CL 106 02/23/2021  ? CO2 19 (L) 02/23/2021  ? ?Sees Dr Holley Raring for renal ?H/o renal stones  ? ? ?DM2 ?Lab Results  ?Component Value Date  ? HGBA1C 9.9 (H) 12/13/2020  ?Blood glucose readings  ?Ams are good 120s-130s in ams  ?Pm above 140 usually  ? ?Lab Results  ?Component Value Date  ? HGBA1C 7.6 (A) 04/26/2021  ? ?Improved  ? ?Glipizide 10 mg in am and 5 mg in pm  ?Was working on diet and exercise  ? ?She wants to get a continuous glucose monitor covered  ? ?Would be open to GLP  ? ?Taking both arb and statin  ?Eye exam is utd  ? ?Hyperlipidemia  ?Lab Results  ?Component Value Date  ? CHOL 82 12/13/2020  ? HDL 30.90 (L) 12/13/2020  ? [place] 15 12/13/2020  ? LDLDIRECT 62.0 11/06/2019  ? TRIG 178.0 (H) 12/13/2020  ? CHOLHDL 3 12/13/2020  ? ?Atorvastatin 1/2 pill every other day 10 mg  ? ? ?Sees hematologist for elevated wbc  ? ?Had abn EKG with the kidney stone procedure  ?Nonspecific ST/ T wave abn and L axis deviation  ?Had echo in 2019 Dr Rockey Situ  ? ?Patient Active Problem List  ? Diagnosis Date Noted   ? Abnormal EKG 04/26/2021  ? Ileostomy dysfunction ([place]) 08/31/2020  ? CKD (chronic kidney disease) stage 3, GFR 30-59 ml/min (HCC) 05/31/2020  ? Aortic atherosclerosis ([place]) 03/01/2020  ? Urinary frequency 12/23/2019  ? Elevated serum creatinine 05/14/2019  ? Dermatitis 07/01/2018  ? Left shoulder pain 11/30/2017  ? Palpitations 07/05/2017  ? Ostomy nurse consultation 06/05/2017  ? Lichen planus 94/50/3888  ? Hyperlipidemia associated with type 2 diabetes mellitus ([place]) 04/10/2016  ? Obesity (BMI 30-39.9) 01/08/2015  ? Red blood cell antibody positive 07/02/2013  ? Elevated C-reactive protein (CRP) 05/15/2012  ? Routine general medical examination at a health care facility 09/24/2011  ? Apnea 11/14/2010  ? Nevus of lower leg 11/14/2010  ? Other screening mammogram 10/31/2010  ? Stress reaction, emotional 05/02/2010  ? Compulsive overeater 05/02/2010  ? Arthritis   ? H/O small bowel obstruction   ? KNEE PAIN 10/14/2009  ? HOARSENESS 07/21/2009  ? DM (diabetes mellitus), type 2 with renal complications ([place]) 28/00/3491  ? ROSACEA 09/12/2007  ? Essential hypertension 05/15/2007  ? OSA (obstructive sleep apnea) 12/13/2006  ? Ulcerative colitis ([place])  07/12/2006  ? ?Past Medical History:  ?Diagnosis Date  ? Anemia   ? Arthritis   ? generalized  ? Bowel obstruction ([place])   ? repeated (? possible adhesions) neg EGD  ? Chronic kidney disease   ? Colitis   ? with colectomy  ? Diabetes mellitus without complication ([place])   ? Dyspnea   ? covid  ? Gall stones 01/23/2006  ? Gastroenteritis 07/25/2014  ? hosp for IVF   ? GERD (gastroesophageal reflux disease)   ? History of kidney stones   ? HTN (hypertension)   ? Hyperglycemia   ? mild-monitors A1C  ? Obesity   ? Pneumonia   ? Rosacea   ? Sleep apnea   ? CPAP everynight  ? ?Past Surgical History:  ?Procedure Laterality Date  ? CHOLECYSTECTOMY    ? COLECTOMY  1993  ? has ileostomy  ? COLON SURGERY  2021  ? removed rectum  ? COLONOSCOPY  08/1991  ? COLOSTOMY REVISION  12/06/2011   ? Procedure: COLOSTOMY REVISION;  Surgeon: Leighton Ruff, MD;  Location: [facility];  Service: General;  Laterality: Right;  OSTOMY revision  ? CYSTOSCOPY/URETEROSCOPY/HOLMIUM LASER/STENT PLACEMENT Left 04/11/2021  ? Procedure: CYSTOSCOPY/URETEROSCOPY/HOLMIUM LASER/STENT PLACEMENT;  Surgeon: Hollice Espy, MD;  Location: [facility];  Service: Urology;  Laterality: Left;  ? EXAMINATION UNDER ANESTHESIA  12/06/2011  ? Procedure: EXAM UNDER ANESTHESIA;  Surgeon: Leighton Ruff, MD;  Location: [facility];  Service: General;  Laterality: N/A;  rectal exam under anesthesia, anal dilation, pouchoscopy  ?  ? TOTAL ABDOMINAL HYSTERECTOMY  05/2006  ? for abscessed ovaries  ? ?Social History  ? ?Tobacco Use  ? Smoking status: Never  ? Smokeless tobacco: Never  ?Vaping Use  ? Vaping Use: Never used  ?Substance Use Topics  ? Alcohol use: No  ?  Alcohol/week: 0.0 standard drinks  ? Drug use: No  ? ?Family History  ?Problem Relation Age of Onset  ? Cancer Father   ?     colon  ? Liver cancer Father   ?     resection secondary to mets  ? Hyperlipidemia Father   ? Hypertension Father   ? Allergies Father   ? Ulcerative colitis Father   ? Hypertension Mother   ? Hyperlipidemia Mother   ? Cirrhosis Mother   ?     NASH  ? Diabetes Mother   ? Allergies Brother   ? Gastric cancer Brother   ? Breast cancer Paternal Grandmother   ? Breast cancer Cousin 17  ?     paternal cousins  ? ?Allergies  ?Allergen Reactions  ? Jardiance [Empagliflozin]   ?  Increase in Cr   ? Lisinopril   ?  REACTION: felt bad/ stomach hurt/ weak and tired  ? Metformin And Related   ?  GI  ? Ciprofloxacin Rash  ?  Erythema and pruritis around IV site, erythema and rash along vein  ? Sulfa Antibiotics Rash  ? ?Current Outpatient Medications on File Prior to Visit  ?Medication Sig Dispense Refill  ? acetaminophen (TYLENOL) 325 MG tablet Take 650 mg by mouth every 6 (six) hours as needed for moderate pain.    ? albuterol (VENTOLIN HFA) 108  (90 Base) MCG/ACT inhaler Inhale 2 puffs into the lungs every 4 (four) hours as needed for wheezing or shortness of breath. 8 g 2  ? atorvastatin (LIPITOR) 10 MG tablet TAKE 1/2 PILL BY MOUTH EVERY OTHER DAY 24 tablet 1  ? Blood Glucose  Monitoring Suppl (ACCU-CHEK GUIDE) w/Device KIT To check glucose daily and as needed for type 2 diabetes 1 kit 0  ? Cholecalciferol (VITAMIN D3) 25 MCG (1000 UT) CAPS Take 1,000 Units by mouth daily.    ? esomeprazole (NEXIUM) 20 MG capsule Take 20 mg by mouth every other day.    ? fluticasone (FLONASE) 50 MCG/ACT nasal spray Place 2 sprays into both nostrils at bedtime.    ? glipiZIDE (GLUCOTROL) 5 MG tablet TAKE 1 TABLET (5 MG TOTAL) BY MOUTH 2 (TWO) TIMES DAILY BEFORE A MEAL. (Patient taking differently: TAKE 2 TABLET (5 MG TOTAL) BY MOUTH AND ONE TABLET IN PM) 180 tablet 0  ? glucose blood (ACCU-CHEK GUIDE) test strip TO CHECK GLUCOSE DAILY AND AS NEEDED FOR TYPE 2 DIABETES 100 strip 0  ? HYDROcodone-acetaminophen (NORCO/VICODIN) 5-325 MG tablet Take 1-2 tablets by mouth every 6 (six) hours as needed for moderate pain. 10 tablet 0  ? hyoscyamine (LEVSIN SL) 0.125 MG SL tablet Take 1 tablet (0.125 mg total) by mouth every 4 (four) hours as needed. For abdominal cramps 90 tablet 3  ? Lancets (ACCU-CHEK MULTICLIX) lancets To check glucose daily and as needed for diabetes type 2 100 each 3  ? losartan (COZAAR) 25 MG tablet Take 25 mg by mouth daily.    ? melatonin 1 MG TABS tablet Take 1.5 mg by mouth at bedtime.    ? metoprolol tartrate (LOPRESSOR) 25 MG tablet TAKE 1 TABLET BY MOUTH EVERY DAY 90 tablet 1  ? Multiple Vitamin (MULTIVITAMIN) tablet Take 1 tablet by mouth daily.    ? NON FORMULARY SLEEPS WITH BI-PAP    ? ondansetron (ZOFRAN-ODT) 4 MG disintegrating tablet Take 1 tablet (4 mg total) by mouth every 8 (eight) hours as needed for nausea or vomiting. 20 tablet 0  ? oxybutynin (DITROPAN) 5 MG tablet Take 1 tablet (5 mg total) by mouth every 8 (eight) hours as needed for  bladder spasms. 30 tablet 0  ? tamsulosin (FLOMAX) 0.4 MG CAPS capsule Take 1 capsule (0.4 mg total) by mouth daily. 30 capsule 0  ? zolpidem (AMBIEN) 10 MG tablet Take 0.5-1 tablets (5-10 mg total) by

## 2021-04-26 NOTE — Patient Instructions (Addendum)
Call your insurance to check on coverage of the continuous glucose monitor  ? ?Also ask about coverage of GLP med (ozempic)  ?Let me know which brand of GLP they cover  ? ?We can refer you to cardiology to review the EKG change  ?If you have worse shortness of breath or any chest pain let us know  ?I will place a cardiology referral so you will get a call ? ? ? ? ?

## 2021-04-26 NOTE — Assessment & Plan Note (Signed)
Disc goals for lipids and reasons to control them ?Rev last labs with pt ?Rev low sat fat diet in detail ?LDL is controlled at 62 on last check ?Plan to continue atorvastatin 5 mg daily every other day ?

## 2021-04-26 NOTE — Assessment & Plan Note (Signed)
Noted to have abn EKG with nonspecific ST/T wave change and L axis deviation  ?This was in prep for renal stone treatment  ?No new sob or cp ?Will refer to cardiology ?She has seen Dr Rockey Situ in the past  ?

## 2021-04-26 NOTE — Assessment & Plan Note (Signed)
Lab Results  ?Component Value Date  ? HGBA1C 7.6 (A) 04/26/2021  ? ?This is significantly improved from 9.9 in November ?Blood glucose readings are much lower in the mornings but tend to be above 140 later in the day ?Patient is interested in a continuous glucose monitor but needs to call and find out how much that would cost, insurance may not pay since she is not on insulin ?Plan to continue glipizide 10 mg in the morning and 5 mg in the evening, hesitant to increase the p.m. dose due to lower a.m. readings ?Encouraged her to continue a low glycemic diet ?Would like to investigate the possibility of a GLP drugs such as semaglutide, discussed this and given patient information on the drugs ?She will call her insurance to see what brand may be covered and likely go from there ?

## 2021-04-27 NOTE — Telephone Encounter (Signed)
Please check and see if these look right and are going to the correct places?   Thanks  ?

## 2021-04-28 ENCOUNTER — Inpatient Hospital Stay: Payer: Medicare PPO | Attending: Oncology | Admitting: Oncology

## 2021-04-28 ENCOUNTER — Inpatient Hospital Stay: Payer: Medicare PPO

## 2021-04-28 ENCOUNTER — Encounter: Payer: Self-pay | Admitting: Oncology

## 2021-04-28 VITALS — HR 66 | Temp 96.2°F | Resp 16 | Ht 66.0 in | Wt 250.9 lb

## 2021-04-28 DIAGNOSIS — R778 Other specified abnormalities of plasma proteins: Secondary | ICD-10-CM | POA: Insufficient documentation

## 2021-04-28 DIAGNOSIS — N1832 Chronic kidney disease, stage 3b: Secondary | ICD-10-CM | POA: Insufficient documentation

## 2021-04-28 DIAGNOSIS — R779 Abnormality of plasma protein, unspecified: Secondary | ICD-10-CM | POA: Insufficient documentation

## 2021-04-28 LAB — CBC WITH DIFFERENTIAL/PLATELET
Abs Immature Granulocytes: 0.04 10*3/uL (ref 0.00–0.07)
Basophils Absolute: 0 10*3/uL (ref 0.0–0.1)
Basophils Relative: 0 %
Eosinophils Absolute: 0.3 10*3/uL (ref 0.0–0.5)
Eosinophils Relative: 3 %
HCT: 39.2 % (ref 36.0–46.0)
Hemoglobin: 12.8 g/dL (ref 12.0–15.0)
Immature Granulocytes: 0 %
Lymphocytes Relative: 22 %
Lymphs Abs: 2.2 10*3/uL (ref 0.7–4.0)
MCH: 31.4 pg (ref 26.0–34.0)
MCHC: 32.7 g/dL (ref 30.0–36.0)
MCV: 96.1 fL (ref 80.0–100.0)
Monocytes Absolute: 0.7 10*3/uL (ref 0.1–1.0)
Monocytes Relative: 7 %
Neutro Abs: 7 10*3/uL (ref 1.7–7.7)
Neutrophils Relative %: 68 %
Platelets: 256 10*3/uL (ref 150–400)
RBC: 4.08 MIL/uL (ref 3.87–5.11)
RDW: 13.5 % (ref 11.5–15.5)
WBC: 10.2 10*3/uL (ref 4.0–10.5)
nRBC: 0 % (ref 0.0–0.2)

## 2021-04-28 LAB — COMPREHENSIVE METABOLIC PANEL
ALT: 20 U/L (ref 0–44)
AST: 32 U/L (ref 15–41)
Albumin: 3.6 g/dL (ref 3.5–5.0)
Alkaline Phosphatase: 78 U/L (ref 38–126)
Anion gap: 7 (ref 5–15)
BUN: 19 mg/dL (ref 8–23)
CO2: 20 mmol/L — ABNORMAL LOW (ref 22–32)
Calcium: 8.9 mg/dL (ref 8.9–10.3)
Chloride: 107 mmol/L (ref 98–111)
Creatinine, Ser: 1.46 mg/dL — ABNORMAL HIGH (ref 0.44–1.00)
GFR, Estimated: 39 mL/min — ABNORMAL LOW (ref >60)
Glucose, Bld: 226 mg/dL — ABNORMAL HIGH (ref 70–99)
Potassium: 4 mmol/L (ref 3.5–5.1)
Sodium: 134 mmol/L — ABNORMAL LOW (ref 135–145)
Total Bilirubin: 0.9 mg/dL (ref 0.3–1.2)
Total Protein: 7.3 g/dL (ref 6.5–8.1)

## 2021-04-28 LAB — RETIC PANEL
Immature Retic Fract: 16.6 % — ABNORMAL HIGH (ref 2.3–15.9)
RBC.: 4.07 MIL/uL (ref 3.87–5.11)
Retic Count, Absolute: 98.1 10*3/uL (ref 19.0–186.0)
Retic Ct Pct: 2.4 % (ref 0.4–3.1)
Reticulocyte Hemoglobin: 35.3 pg (ref >27.9)

## 2021-04-28 NOTE — Progress Notes (Signed)
New patient referred by Dr Holley Raring. Pt recently had kidney stone surgery.  ?

## 2021-04-28 NOTE — Progress Notes (Signed)
?Hematology/Oncology Consult note ?[phone] [phone] ?  ? ?   ? ? ?Patient Care Team: ?Tower, Wynelle Fanny, MD as PCP - General ?Earlie Server, MD as Consulting Physician (Hematology and Oncology) ?Anthonette Legato, MD as Consulting Physician (Nephrology) ?Hollice Espy, MD as Consulting Physician (Urology) ? ?REFERRING PROVIDER: ?Anthonette Legato, MD  ?CHIEF COMPLAINTS/REASON FOR VISIT:  ?Evaluation of abnormal SPEP ? ?HISTORY OF PRESENTING ILLNESS:  ? ?Jamie [name] is a  66 y.o.  female with PMH listed below was seen in consultation at the request of  Anthonette Legato, MD  for evaluation of abnormal SPEP ? ?Patient has chronic kidney disease.  She follows up with Dr. Holley Raring.  As part of the chronic kidney disease work-up, patient had protein electrophoresis done which showed poorly defined band of restricted protein mobility is detected, globulin.  Immunofixation analysis recommended. ?ANA is negative ?Chronic kidney disease was felt to be secondary to diabetes recent obstructive kidney disease due to kidney stone.  Status post Left ureteroscopy, laser lithotripsy. ?+ Back pain when she lies down. ?Patient has history of inflammatory bowel disease status post total colectomy.   ? ?Review of Systems  ?Constitutional:  Negative for appetite change, chills, fatigue and fever.  ?HENT:   Negative for hearing loss and voice change.   ?Eyes:  Negative for eye problems.  ?Respiratory:  Negative for chest tightness and cough.   ?Cardiovascular:  Negative for chest pain.  ?Gastrointestinal:  Negative for abdominal distention, abdominal pain and blood in stool.  ?Endocrine: Negative for hot flashes.  ?Genitourinary:  Negative for difficulty urinating and frequency.   ?Musculoskeletal:  Negative for arthralgias.  ?Skin:  Negative for itching and rash.  ?Neurological:  Negative for extremity weakness.  ?Hematological:  Negative for adenopathy.  ?Psychiatric/Behavioral:  Negative for confusion.   ? ?MEDICAL  HISTORY:  ?Past Medical History:  ?Diagnosis Date  ? Anemia   ? Arthritis   ? generalized  ? Bowel obstruction ([place])   ? repeated (? possible adhesions) neg EGD  ? Chronic kidney disease   ? Colitis   ? with colectomy  ? Diabetes mellitus without complication ([place])   ? Dyspnea   ? covid  ? Gall stones 01/23/2006  ? Gastroenteritis 07/25/2014  ? hosp for IVF   ? GERD (gastroesophageal reflux disease)   ? History of kidney stones   ? HTN (hypertension)   ? Hyperglycemia   ? mild-monitors A1C  ? Obesity   ? Pneumonia   ? Rosacea   ? Sleep apnea   ? CPAP everynight  ? ? ?SURGICAL HISTORY: ?Past Surgical History:  ?Procedure Laterality Date  ? CHOLECYSTECTOMY    ? COLECTOMY  1993  ? has ileostomy  ? COLON SURGERY  2021  ? removed rectum  ? COLONOSCOPY  08/1991  ? COLOSTOMY REVISION  12/06/2011  ? Procedure: COLOSTOMY REVISION;  Surgeon: Leighton Ruff, MD;  Location: [facility];  Service: General;  Laterality: Right;  OSTOMY revision  ? CYSTOSCOPY/URETEROSCOPY/HOLMIUM LASER/STENT PLACEMENT Left 04/11/2021  ? Procedure: CYSTOSCOPY/URETEROSCOPY/HOLMIUM LASER/STENT PLACEMENT;  Surgeon: Hollice Espy, MD;  Location: [facility];  Service: Urology;  Laterality: Left;  ? EXAMINATION UNDER ANESTHESIA  12/06/2011  ? Procedure: EXAM UNDER ANESTHESIA;  Surgeon: Leighton Ruff, MD;  [facility];  Service: General;  Laterality: N/A;  rectal exam under anesthesia, anal dilation, pouchoscopy  ?  ? TOTAL ABDOMINAL HYSTERECTOMY  05/2006  ? for abscessed ovaries  ? ? ?SOCIAL HISTORY: ?Social History  ? ?Socioeconomic  History  ? Marital status: Married  ?  Spouse name: Delfino Lovett  ? Number of children: 3  ? Years of education: Not on file  ? Highest education level: Not on file  ?Occupational History  ? Occupation: Guardian [employer]  ?Tobacco Use  ? Smoking status: Never  ? Smokeless tobacco: Never  ?Vaping Use  ? Vaping Use: Never used  ?Substance and Sexual Activity  ? Alcohol use: No  ?  Alcohol/week:  0.0 standard drinks  ? Drug use: No  ? Sexual activity: Yes  ?Other Topics Concern  ? Not on file  ?Social History Narrative  ? Not on file  ? ?Social Determinants of Health  ? ?Financial Resource Strain: Not on file  ?Food Insecurity: Not on file  ?Transportation Needs: Not on file  ?Physical Activity: Not on file  ?Stress: Not on file  ?Social Connections: Not on file  ?Intimate Partner Violence: Not on file  ? ? ?FAMILY HISTORY: ?Family History  ?Problem Relation Age of Onset  ? Hypertension Mother   ? Hyperlipidemia Mother   ? Cirrhosis Mother   ?     NASH  ? Diabetes Mother   ? Liver cancer Father   ?     resection secondary to mets  ? Cancer Father   ?     colon  ? Hyperlipidemia Father   ? Hypertension Father   ? Allergies Father   ? Ulcerative colitis Father   ? Allergies Brother   ? Gastric cancer Brother   ? Breast cancer Paternal Grandmother   ? Breast cancer Cousin 67  ?     paternal cousins  ? ? ?ALLERGIES:  is allergic to jardiance [empagliflozin], lisinopril, metformin and related, ciprofloxacin, and sulfa antibiotics. ? ?MEDICATIONS:  ?Current Outpatient Medications  ?Medication Sig Dispense Refill  ? acetaminophen (TYLENOL) 325 MG tablet Take 650 mg by mouth every 6 (six) hours as needed for moderate pain.    ? albuterol (VENTOLIN HFA) 108 (90 Base) MCG/ACT inhaler Inhale 2 puffs into the lungs every 4 (four) hours as needed for wheezing or shortness of breath. 8 g 2  ? atorvastatin (LIPITOR) 10 MG tablet TAKE 1/2 PILL BY MOUTH EVERY OTHER DAY 24 tablet 1  ? Blood Glucose Monitoring Suppl (ACCU-CHEK GUIDE) w/Device KIT To check glucose daily and as needed for type 2 diabetes 1 kit 0  ? Cholecalciferol (VITAMIN D3) 25 MCG (1000 UT) CAPS Take 1,000 Units by mouth daily.    ? esomeprazole (NEXIUM) 20 MG capsule Take 20 mg by mouth every other day.    ? fluticasone (FLONASE) 50 MCG/ACT nasal spray Place 2 sprays into both nostrils at bedtime.    ? glipiZIDE (GLUCOTROL) 5 MG tablet TAKE 1 TABLET (5 MG  TOTAL) BY MOUTH 2 (TWO) TIMES DAILY BEFORE A MEAL. (Patient taking differently: TAKE 2 TABLET (5 MG TOTAL) BY MOUTH AND ONE TABLET IN PM) 180 tablet 0  ? glucose blood (ACCU-CHEK GUIDE) test strip TO CHECK GLUCOSE DAILY AND AS NEEDED FOR TYPE 2 DIABETES 100 strip 0  ? hyoscyamine (LEVSIN SL) 0.125 MG SL tablet Take 1 tablet (0.125 mg total) by mouth every 4 (four) hours as needed. For abdominal cramps 90 tablet 3  ? Lancets (ACCU-CHEK MULTICLIX) lancets To check glucose daily and as needed for diabetes type 2 100 each 3  ? losartan (COZAAR) 25 MG tablet Take 25 mg by mouth daily.    ? melatonin 1 MG TABS tablet Take 1.5 mg by mouth  at bedtime.    ? metoprolol tartrate (LOPRESSOR) 25 MG tablet TAKE 1 TABLET BY MOUTH EVERY DAY 90 tablet 1  ? Multiple Vitamin (MULTIVITAMIN) tablet Take 1 tablet by mouth daily.    ? NON FORMULARY SLEEPS WITH BI-PAP    ? ondansetron (ZOFRAN-ODT) 4 MG disintegrating tablet Take 1 tablet (4 mg total) by mouth every 8 (eight) hours as needed for nausea or vomiting. 20 tablet 0  ? zolpidem (AMBIEN) 10 MG tablet Take 0.5-1 tablets (5-10 mg total) by mouth at bedtime as needed. for sleep 30 tablet 1  ? HYDROcodone-acetaminophen (NORCO/VICODIN) 5-325 MG tablet Take 1-2 tablets by mouth every 6 (six) hours as needed for moderate pain. (Patient not taking: Reported on 04/28/2021) 10 tablet 0  ? oxybutynin (DITROPAN) 5 MG tablet Take 1 tablet (5 mg total) by mouth every 8 (eight) hours as needed for bladder spasms. (Patient not taking: Reported on 04/28/2021) 30 tablet 0  ? tamsulosin (FLOMAX) 0.4 MG CAPS capsule Take 1 capsule (0.4 mg total) by mouth daily. (Patient not taking: Reported on 04/28/2021) 30 capsule 0  ? ?No current facility-administered medications for this visit.  ? ? ? ?PHYSICAL EXAMINATION: ? ?Vitals:  ? 04/28/21 1517  ?Pulse: 66  ?Resp: 16  ?Temp: (!) 96.2 ?F (35.7 ?C)  ?SpO2: 98%  ? ?Filed Weights  ? 04/28/21 1517  ?Weight: 250 lb 14.4 oz (113.8 kg)  ? ? ?Physical  Exam ?Constitutional:   ?   General: She is not in acute distress. ?HENT:  ?   Head: Normocephalic and atraumatic.  ?Eyes:  ?   General: No scleral icterus. ?Cardiovascular:  ?   Rate and Rhythm: Normal rate and regular rh

## 2021-04-29 LAB — KAPPA/LAMBDA LIGHT CHAINS
Kappa free light chain: 64.4 mg/L — ABNORMAL HIGH (ref 3.3–19.4)
Kappa, lambda light chain ratio: 1.47 (ref 0.26–1.65)
Lambda free light chains: 43.8 mg/L — ABNORMAL HIGH (ref 5.7–26.3)

## 2021-04-29 MED ORDER — DEXCOM G7 SENSOR MISC: 11 refills | Status: DC

## 2021-04-29 MED ORDER — OZEMPIC (0.25 OR 0.5 MG/DOSE) 2 MG/1.5ML ~~LOC~~ SOPN: 0.5000 mg | PEN_INJECTOR | SUBCUTANEOUS | 3 refills | Status: DC

## 2021-04-29 MED ORDER — DEXCOM G7 RECEIVER DEVI: 3 refills | Status: DC

## 2021-04-30 ENCOUNTER — Encounter: Payer: Self-pay | Admitting: Oncology

## 2021-05-02 DIAGNOSIS — N1832 Chronic kidney disease, stage 3b: Secondary | ICD-10-CM | POA: Diagnosis not present

## 2021-05-02 DIAGNOSIS — R779 Abnormality of plasma protein, unspecified: Secondary | ICD-10-CM | POA: Diagnosis not present

## 2021-05-02 LAB — MULTIPLE MYELOMA PANEL, SERUM
Albumin SerPl Elph-Mcnc: 3.2 g/dL (ref 2.9–4.4)
Albumin/Glob SerPl: 1 (ref 0.7–1.7)
Alpha 1: 0.3 g/dL (ref 0.0–0.4)
Alpha2 Glob SerPl Elph-Mcnc: 0.9 g/dL (ref 0.4–1.0)
B-Globulin SerPl Elph-Mcnc: 1.4 g/dL — ABNORMAL HIGH (ref 0.7–1.3)
Gamma Glob SerPl Elph-Mcnc: 0.8 g/dL (ref 0.4–1.8)
Globulin, Total: 3.3 g/dL (ref 2.2–3.9)
IgA: 528 mg/dL — ABNORMAL HIGH (ref 87–352)
IgG (Immunoglobin G), Serum: 798 mg/dL (ref 586–1602)
IgM (Immunoglobulin M), Srm: 75 mg/dL (ref 26–217)
Total Protein ELP: 6.5 g/dL (ref 6.0–8.5)

## 2021-05-03 ENCOUNTER — Other Ambulatory Visit: Payer: Self-pay

## 2021-05-03 DIAGNOSIS — R778 Other specified abnormalities of plasma proteins: Secondary | ICD-10-CM

## 2021-05-04 ENCOUNTER — Ambulatory Visit
Admission: RE | Admit: 2021-05-04 | Discharge: 2021-05-04 | Disposition: A | Discharge status: Home / Self Care | Payer: Medicare PPO | Source: Ambulatory Visit | Attending: Urology | Admitting: Urology

## 2021-05-04 DIAGNOSIS — N201 Calculus of ureter: Secondary | ICD-10-CM | POA: Diagnosis not present

## 2021-05-04 DIAGNOSIS — Z0389 Encounter for observation for other suspected diseases and conditions ruled out: Secondary | ICD-10-CM | POA: Diagnosis not present

## 2021-05-06 DIAGNOSIS — L400 Psoriasis vulgaris: Secondary | ICD-10-CM | POA: Diagnosis not present

## 2021-05-06 DIAGNOSIS — D2272 Melanocytic nevi of left lower limb, including hip: Secondary | ICD-10-CM | POA: Diagnosis not present

## 2021-05-06 DIAGNOSIS — D485 Neoplasm of uncertain behavior of skin: Secondary | ICD-10-CM | POA: Diagnosis not present

## 2021-05-06 DIAGNOSIS — L820 Inflamed seborrheic keratosis: Secondary | ICD-10-CM | POA: Diagnosis not present

## 2021-05-06 DIAGNOSIS — D2261 Melanocytic nevi of right upper limb, including shoulder: Secondary | ICD-10-CM | POA: Diagnosis not present

## 2021-05-06 DIAGNOSIS — D2262 Melanocytic nevi of left upper limb, including shoulder: Secondary | ICD-10-CM | POA: Diagnosis not present

## 2021-05-06 LAB — IFE+PROTEIN ELECTRO, 24-HR UR
% BETA, Urine: 40.9 %
ALPHA 1 URINE: 5.6 %
Albumin, U: 8.8 %
Alpha 2, Urine: 10.1 %
GAMMA GLOBULIN URINE: 34.6 %
Total Protein, Urine-Ur/day: 189 mg/24 hr — ABNORMAL HIGH (ref 30–150)
Total Protein, Urine: 15.4 mg/dL
Total Volume: 1225

## 2021-05-09 ENCOUNTER — Encounter: Payer: Self-pay | Admitting: Oncology

## 2021-05-10 ENCOUNTER — Encounter: Payer: Self-pay | Admitting: Urology

## 2021-05-10 ENCOUNTER — Other Ambulatory Visit: Payer: Self-pay | Admitting: *Deleted

## 2021-05-10 ENCOUNTER — Ambulatory Visit: Payer: Medicare PPO | Admitting: Urology

## 2021-05-10 VITALS — BP 140/80 | HR 78 | Ht 66.0 in | Wt 250.0 lb

## 2021-05-10 DIAGNOSIS — N189 Chronic kidney disease, unspecified: Secondary | ICD-10-CM

## 2021-05-10 DIAGNOSIS — N179 Acute kidney failure, unspecified: Secondary | ICD-10-CM | POA: Diagnosis not present

## 2021-05-10 DIAGNOSIS — Z87442 Personal history of urinary calculi: Secondary | ICD-10-CM

## 2021-05-10 DIAGNOSIS — N201 Calculus of ureter: Secondary | ICD-10-CM

## 2021-05-10 MED ORDER — OXYBUTYNIN CHLORIDE 5 MG PO TABS
5.0000 mg | ORAL_TABLET | Freq: Three times a day (TID) | ORAL | 0 refills | Status: DC | PRN
Start: 2021-05-10 — End: 2021-05-10

## 2021-05-10 NOTE — Progress Notes (Signed)
? ?05/10/2021 ?8:40 AM  ? ?Jamie [name] ?[birth year] ?[id number] ? ?Referring provider:  ?Tower, Wynelle Fanny, MD ?[address] ?[place],  [place] [address] ?No chief complaint on file. ? ? ?HPI: ?Jamie [name] is a 66 y.o.female with a personal history of a left proximal ureteral stone and acute on chronic kidney injury, who presents today for a 1 month follow-up with RUS prior.  ? ?She is followed by nephrology. She also has a history of ileostomy x 30 years.  ? ?RUS on 02/18/2021 for chronic renal disease visualized 8 mm nonobstructing left renal calculus.  ? ?CT abdomen and pelvis on 03/18/2021 visualized nonobstructive 8 mm stone in the left proximal ureter. No hydroureteronephrosis. Probable additional nonobstructive left lower pole renal stones. A stone of this size in the ureter would be expected to be obstructive, thus given no hydroureteronephrosis this could reflect a poorly functioning left kidney.  ? ?She is s/p ureteroscopy on 04/11/2021.   ? ?RUS on 05/05/2021 visualized a negative exam, no hydronephrosis, no stones, a nondistended bladder and bilateral ureteral jets.   ? ?Stone analysis showed 90% calcium oxalate monohydrate and 10% calcium oxalate dihydrate.  ? ?She is doing well today.   She denies any flank pain. ? ?PMH: ?Past Medical History:  ?Diagnosis Date  ? Anemia   ? Arthritis   ? generalized  ? Bowel obstruction ([place])   ? repeated (? possible adhesions) neg EGD  ? Chronic kidney disease   ? Colitis   ? with colectomy  ? Diabetes mellitus without complication ([place])   ? Dyspnea   ? covid  ? Gall stones 01/23/2006  ? Gastroenteritis 07/25/2014  ? hosp for IVF   ? GERD (gastroesophageal reflux disease)   ? History of kidney stones   ? HTN (hypertension)   ? Hyperglycemia   ? mild-monitors A1C  ? Obesity   ? Pneumonia   ? Rosacea   ? Sleep apnea   ? CPAP everynight  ? ? ?Surgical History: ?Past Surgical History:  ?Procedure Laterality Date  ? CHOLECYSTECTOMY    ? COLECTOMY  1993  ? has ileostomy   ? COLON SURGERY  2021  ? removed rectum  ? COLONOSCOPY  08/1991  ? COLOSTOMY REVISION  12/06/2011  ? Procedure: COLOSTOMY REVISION;  Surgeon: Leighton Ruff, MD;  Location: [facility];  Service: General;  Laterality: Right;  OSTOMY revision  ? CYSTOSCOPY/URETEROSCOPY/HOLMIUM LASER/STENT PLACEMENT Left 04/11/2021  ? Procedure: CYSTOSCOPY/URETEROSCOPY/HOLMIUM LASER/STENT PLACEMENT;  Surgeon: Hollice Espy, MD;  Location: [facility];  Service: Urology;  Laterality: Left;  ? EXAMINATION UNDER ANESTHESIA  12/06/2011  ? Procedure: EXAM UNDER ANESTHESIA;  Surgeon: Leighton Ruff, MD;  Location: [facility];  Service: General;  Laterality: N/A;  rectal exam under anesthesia, anal dilation, pouchoscopy  ?  ? TOTAL ABDOMINAL HYSTERECTOMY  05/2006  ? for abscessed ovaries  ? ? ?Home Medications:  ?Allergies as of 05/10/2021   ? ?   Reactions  ? Jardiance [empagliflozin]   ? Increase in Cr   ? Lisinopril   ? REACTION: felt bad/ stomach hurt/ weak and tired  ? Metformin And Related   ? GI  ? Ciprofloxacin Rash  ? Erythema and pruritis around IV site, erythema and rash along vein  ? Sulfa Antibiotics Rash  ? ?  ? ?  ?Medication List  ?  ? ?  ? Accurate as of May 10, 2021 11:59 PM. If you have any questions, ask your nurse or doctor.  ?  ?  ? ?  ? ?  STOP taking these medications   ? ?Dexcom G7 Receiver Kerrin Mo ?Stopped by: Hollice Espy, MD ?  ?Dexcom G7 Sensor Misc ?Stopped by: Hollice Espy, MD ?  ?HYDROcodone-acetaminophen 5-325 MG tablet ?Commonly known as: NORCO/VICODIN ?Stopped by: Hollice Espy, MD ?  ?ondansetron 4 MG disintegrating tablet ?Commonly known as: ZOFRAN-ODT ?Stopped by: Hollice Espy, MD ?  ?oxybutynin 5 MG tablet ?Commonly known as: DITROPAN ?Stopped by: Verlene Mayer, CMA ?  ?tamsulosin 0.4 MG Caps capsule ?Commonly known as: Flomax ?Stopped by: Hollice Espy, MD ?  ? ?  ? ?TAKE these medications   ? ?Accu-Chek Guide test strip ?Generic drug: glucose blood ?TO CHECK GLUCOSE  DAILY AND AS NEEDED FOR TYPE 2 DIABETES ?  ?Accu-Chek Guide w/Device Kit ?To check glucose daily and as needed for type 2 diabetes ?  ?accu-chek multiclix lancets ?To check glucose daily and as needed for diabetes type 2 ?  ?acetaminophen 325 MG tablet ?Commonly known as: TYLENOL ?Take 650 mg by mouth every 6 (six) hours as needed for moderate pain. ?  ?albuterol 108 (90 Base) MCG/ACT inhaler ?Commonly known as: VENTOLIN HFA ?Inhale 2 puffs into the lungs every 4 (four) hours as needed for wheezing or shortness of breath. ?  ?atorvastatin 10 MG tablet ?Commonly known as: LIPITOR ?TAKE 1/2 PILL BY MOUTH EVERY OTHER DAY ?  ?esomeprazole 20 MG capsule ?Commonly known as: [place] ?Take 20 mg by mouth every other day. ?  ?fluticasone 50 MCG/ACT nasal spray ?Commonly known as: FLONASE ?Place 2 sprays into both nostrils at bedtime. ?  ?glipiZIDE 5 MG tablet ?Commonly known as: GLUCOTROL ?TAKE 1 TABLET (5 MG TOTAL) BY MOUTH 2 (TWO) TIMES DAILY BEFORE A MEAL. ?What changed: additional instructions ?  ?hyoscyamine 0.125 MG SL tablet ?Commonly known as: LEVSIN SL ?Take 1 tablet (0.125 mg total) by mouth every 4 (four) hours as needed. For abdominal cramps ?  ?losartan 25 MG tablet ?Commonly known as: COZAAR ?Take 25 mg by mouth daily. ?  ?melatonin 1 MG Tabs tablet ?Take 1.5 mg by mouth at bedtime. ?  ?metoprolol tartrate 25 MG tablet ?Commonly known as: LOPRESSOR ?TAKE 1 TABLET BY MOUTH EVERY DAY ?  ?multivitamin tablet ?Take 1 tablet by mouth daily. ?  ?NON FORMULARY ?SLEEPS WITH BI-PAP ?  ?Ozempic (0.25 or 0.5 MG/DOSE) 2 MG/1.5ML Sopn ?Generic drug: Semaglutide(0.25 or 0.5MG/DOS) ?Inject 0.5 mg into the skin once a week. ?  ?Vitamin D3 25 MCG (1000 UT) Caps ?Take 1,000 Units by mouth daily. ?  ?zolpidem 10 MG tablet ?Commonly known as: AMBIEN ?Take 0.5-1 tablets (5-10 mg total) by mouth at bedtime as needed. for sleep ?  ? ?  ? ? ?Allergies:  ?Allergies  ?Allergen Reactions  ? Jardiance [Empagliflozin]   ?  Increase in Cr    ? Lisinopril   ?  REACTION: felt bad/ stomach hurt/ weak and tired  ? Metformin And Related   ?  GI  ? Ciprofloxacin Rash  ?  Erythema and pruritis around IV site, erythema and rash along vein  ? Sulfa Antibiotics Rash  ? ? ?Family History: ?Family History  ?Problem Relation Age of Onset  ? Hypertension Mother   ? Hyperlipidemia Mother   ? Cirrhosis Mother   ?     NASH  ? Diabetes Mother   ? Liver cancer Father   ?     resection secondary to mets  ? Cancer Father   ?     colon  ? Hyperlipidemia Father   ? Hypertension  Father   ? Allergies Father   ? Ulcerative colitis Father   ? Allergies Brother   ? Gastric cancer Brother   ? Breast cancer Paternal Grandmother   ? Breast cancer Cousin 74  ?     paternal cousins  ? ? ?Social History:  reports that she has never smoked. She has never used smokeless tobacco. She reports that she does not drink alcohol and does not use drugs. ? ? ?Physical Exam: ?BP 140/80   Pulse 78   Ht 5' 6"  (1.676 m)   Wt 250 lb (113.4 kg)   BMI 40.35 kg/m?   ?Constitutional:  Alert and oriented, No acute distress. ?HEENT: [place] AT, moist mucus membranes.  Trachea midline, no masses. ?Cardiovascular: No clubbing, cyanosis, or edema. ?Respiratory: Normal respiratory effort, no increased work of breathing. ?Skin: No rashes, bruises or suspicious lesions. ?Neurologic: Grossly intact, no focal deficits, moving all 4 extremities. ?Psychiatric: Normal mood and affect. ? ?Laboratory Data: ? ?Lab Results  ?Component Value Date  ? CREATININE 1.46 (H) 04/28/2021  ? ?Lab Results  ?Component Value Date  ? HGBA1C 7.6 (A) 04/26/2021  ? ? ?Pertinent Imaging: ?CLINICAL DATA:  History of left ureteral stone. Status post stone ?removal. ?  ?EXAM: ?RENAL / URINARY TRACT ULTRASOUND COMPLETE ?  ?COMPARISON:  Abdomen 04/11/2021. CT 02/23/2021. Ultrasound ?02/18/2021. ?  ?FINDINGS: ?Right Kidney: ?  ?Renal measurements: 11.3 x 4.6 x 4.7 cm = volume: 127 mL. ?Echogenicity within normal limits. No mass or  hydronephrosis ?visualized. ?  ?Left Kidney: ?  ?Renal measurements: A 9.6 x 4.4 x 5.2 cm = volume: 133 mL. ?Echogenicity within normal limits. No mass or hydronephrosis ?visualized. ?  ?Bladder: ?  ?Appears normal for de

## 2021-05-12 ENCOUNTER — Ambulatory Visit: Payer: Medicare PPO | Admitting: Urology

## 2021-05-23 ENCOUNTER — Other Ambulatory Visit: Payer: Self-pay | Admitting: Family Medicine

## 2021-05-26 ENCOUNTER — Encounter: Payer: Self-pay | Admitting: Oncology

## 2021-05-26 ENCOUNTER — Inpatient Hospital Stay: Payer: Medicare PPO | Attending: Oncology | Admitting: Oncology

## 2021-05-26 VITALS — BP 140/61 | HR 76 | Temp 97.2°F | Resp 18 | Wt 252.2 lb

## 2021-05-26 DIAGNOSIS — R779 Abnormality of plasma protein, unspecified: Secondary | ICD-10-CM | POA: Insufficient documentation

## 2021-05-26 DIAGNOSIS — K219 Gastro-esophageal reflux disease without esophagitis: Secondary | ICD-10-CM | POA: Diagnosis not present

## 2021-05-26 DIAGNOSIS — G4730 Sleep apnea, unspecified: Secondary | ICD-10-CM | POA: Insufficient documentation

## 2021-05-26 DIAGNOSIS — L409 Psoriasis, unspecified: Secondary | ICD-10-CM | POA: Insufficient documentation

## 2021-05-26 DIAGNOSIS — N1832 Chronic kidney disease, stage 3b: Secondary | ICD-10-CM | POA: Insufficient documentation

## 2021-05-26 DIAGNOSIS — E1122 Type 2 diabetes mellitus with diabetic chronic kidney disease: Secondary | ICD-10-CM | POA: Diagnosis not present

## 2021-05-26 DIAGNOSIS — Z800 Family history of malignant neoplasm of digestive organs: Secondary | ICD-10-CM | POA: Diagnosis not present

## 2021-05-26 DIAGNOSIS — Z87442 Personal history of urinary calculi: Secondary | ICD-10-CM | POA: Diagnosis not present

## 2021-05-26 DIAGNOSIS — R778 Other specified abnormalities of plasma proteins: Secondary | ICD-10-CM | POA: Diagnosis not present

## 2021-05-26 DIAGNOSIS — I129 Hypertensive chronic kidney disease with stage 1 through stage 4 chronic kidney disease, or unspecified chronic kidney disease: Secondary | ICD-10-CM | POA: Diagnosis not present

## 2021-05-26 DIAGNOSIS — Z8616 Personal history of COVID-19: Secondary | ICD-10-CM | POA: Diagnosis not present

## 2021-05-26 NOTE — Progress Notes (Signed)
?Hematology/Oncology Progress note ?[phone] [phone] ?  ? ?  ? ?   ? ? ?Patient Care Team: ?Tower, Wynelle Fanny, MD as PCP - General ?Earlie Server, MD as Consulting Physician (Hematology and Oncology) ?Anthonette Legato, MD as Consulting Physician (Nephrology) ?Hollice Espy, MD as Consulting Physician (Urology) ? ?REFERRING PROVIDER: ?Tower, Wynelle Fanny, MD  ?CHIEF COMPLAINTS/REASON FOR VISIT:  ?Abnormal protein electrophoresis. ? ?HISTORY OF PRESENTING ILLNESS:  ? ?Jamie [name] is a  66 y.o.  female with PMH listed below was seen in consultation at the request of  Tower, Wynelle Fanny, MD  for evaluation of abnormal SPEP ? ?Patient has chronic kidney disease.  She follows up with Dr. Holley Raring.  As part of the chronic kidney disease work-up, patient had protein electrophoresis done which showed poorly defined band of restricted protein mobility is detected, globulin.  Immunofixation analysis recommended. ?ANA is negative ?Chronic kidney disease was felt to be secondary to diabetes recent obstructive kidney disease due to kidney stone.  Status post Left ureteroscopy, laser lithotripsy. ?+ Back pain when she lies down. ?Patient has history of inflammatory bowel disease status post total colectomy.   ? ?INTERVAL HISTORY ?[name] is a 66 y.o. female who has above history reviewed by me today presents for follow up visit for abnormal SPEP.  Patient had blood work done and present to discuss results.  No new complaints. ? ? ? ?Review of Systems  ?Constitutional:  Negative for appetite change, chills, fatigue and fever.  ?HENT:   Negative for hearing loss and voice change.   ?Eyes:  Negative for eye problems.  ?Respiratory:  Negative for chest tightness and cough.   ?Cardiovascular:  Negative for chest pain.  ?Gastrointestinal:  Negative for abdominal distention, abdominal pain and blood in stool.  ?Endocrine: Negative for hot flashes.  ?Genitourinary:  Negative for difficulty urinating and frequency.    ?Musculoskeletal:  Negative for arthralgias.  ?Skin:  Negative for itching and rash.  ?     Skin psoriasis  ?Neurological:  Negative for extremity weakness.  ?Hematological:  Negative for adenopathy.  ?Psychiatric/Behavioral:  Negative for confusion.   ? ?MEDICAL HISTORY:  ?Past Medical History:  ?Diagnosis Date  ? Anemia   ? Arthritis   ? generalized  ? Bowel obstruction ([place])   ? repeated (? possible adhesions) neg EGD  ? Chronic kidney disease   ? Colitis   ? with colectomy  ? Diabetes mellitus without complication ([place])   ? Dyspnea   ? covid  ? Gall stones 01/23/2006  ? Gastroenteritis 07/25/2014  ? hosp for IVF   ? GERD (gastroesophageal reflux disease)   ? History of kidney stones   ? HTN (hypertension)   ? Hyperglycemia   ? mild-monitors A1C  ? Obesity   ? Pneumonia   ? Rosacea   ? Sleep apnea   ? CPAP everynight  ? ? ?SURGICAL HISTORY: ?Past Surgical History:  ?Procedure Laterality Date  ? CHOLECYSTECTOMY    ? COLECTOMY  1993  ? has ileostomy  ? COLON SURGERY  2021  ? removed rectum  ? COLONOSCOPY  08/1991  ? COLOSTOMY REVISION  12/06/2011  ? Procedure: COLOSTOMY REVISION;  Surgeon: Leighton Ruff, MD;  Location: [facility];  Service: General;  Laterality: Right;  OSTOMY revision  ? CYSTOSCOPY/URETEROSCOPY/HOLMIUM LASER/STENT PLACEMENT Left 04/11/2021  ? Procedure: CYSTOSCOPY/URETEROSCOPY/HOLMIUM LASER/STENT PLACEMENT;  Surgeon: Hollice Espy, MD;  Location: [facility];  Service: Urology;  Laterality: Left;  ? EXAMINATION UNDER ANESTHESIA  12/06/2011  ? Procedure: EXAM UNDER ANESTHESIA;  Surgeon: Leighton Ruff, MD;  Location: [facility];  Service: General;  Laterality: N/A;  rectal exam under anesthesia, anal dilation, pouchoscopy  ?  ? TOTAL ABDOMINAL HYSTERECTOMY  05/2006  ? for abscessed ovaries  ? ? ?SOCIAL HISTORY: ?Social History  ? ?Socioeconomic History  ? Marital status: Married  ?  Spouse name: Delfino Lovett  ? Number of children: 3  ? Years of education: Not on file  ?  Highest education level: Not on file  ?Occupational History  ? Occupation: Guardian [employer]  ?Tobacco Use  ? Smoking status: Never  ? Smokeless tobacco: Never  ?Vaping Use  ? Vaping Use: Never used  ?Substance and Sexual Activity  ? Alcohol use: No  ?  Alcohol/week: 0.0 standard drinks  ? Drug use: No  ? Sexual activity: Yes  ?Other Topics Concern  ? Not on file  ?Social History Narrative  ? Not on file  ? ?Social Determinants of Health  ? ?Financial Resource Strain: Not on file  ?Food Insecurity: Not on file  ?Transportation Needs: Not on file  ?Physical Activity: Not on file  ?Stress: Not on file  ?Social Connections: Not on file  ?Intimate Partner Violence: Not on file  ? ? ?FAMILY HISTORY: ?Family History  ?Problem Relation Age of Onset  ? Hypertension Mother   ? Hyperlipidemia Mother   ? Cirrhosis Mother   ?     NASH  ? Diabetes Mother   ? Liver cancer Father   ?     resection secondary to mets  ? Cancer Father   ?     colon  ? Hyperlipidemia Father   ? Hypertension Father   ? Allergies Father   ? Ulcerative colitis Father   ? Allergies Brother   ? Gastric cancer Brother   ? Breast cancer Paternal Grandmother   ? Breast cancer Cousin 89  ?     paternal cousins  ? ? ?ALLERGIES:  is allergic to jardiance [empagliflozin], lisinopril, metformin and related, ciprofloxacin, and sulfa antibiotics. ? ?MEDICATIONS:  ?Current Outpatient Medications  ?Medication Sig Dispense Refill  ? acetaminophen (TYLENOL) 325 MG tablet Take 650 mg by mouth every 6 (six) hours as needed for moderate pain.    ? albuterol (VENTOLIN HFA) 108 (90 Base) MCG/ACT inhaler Inhale 2 puffs into the lungs every 4 (four) hours as needed for wheezing or shortness of breath. 8 g 2  ? atorvastatin (LIPITOR) 10 MG tablet TAKE 1/2 PILL BY MOUTH EVERY OTHER DAY 24 tablet 1  ? Blood Glucose Monitoring Suppl (ACCU-CHEK GUIDE) w/Device KIT To check glucose daily and as needed for type 2 diabetes 1 kit 0  ? Cholecalciferol (VITAMIN D3) 25 MCG (1000 UT) CAPS  Take 1,000 Units by mouth daily.    ? esomeprazole (NEXIUM) 20 MG capsule Take 20 mg by mouth every other day.    ? fluticasone (FLONASE) 50 MCG/ACT nasal spray Place 2 sprays into both nostrils at bedtime.    ? glipiZIDE (GLUCOTROL) 5 MG tablet TAKE 1 TABLET (5 MG TOTAL) BY MOUTH 2 (TWO) TIMES DAILY BEFORE A MEAL. (Patient taking differently: TAKE 2 TABLET (5 MG TOTAL) BY MOUTH IN THE MORNING AND ONE TABLET IN PM) 180 tablet 0  ? glucose blood (ACCU-CHEK GUIDE) test strip TO CHECK GLUCOSE DAILY AND AS NEEDED FOR TYPE 2 DIABETES 100 strip 0  ? hyoscyamine (LEVSIN SL) 0.125 MG SL tablet Take 1 tablet (0.125 mg total) by mouth  every 4 (four) hours as needed. For abdominal cramps 90 tablet 3  ? Lancets (ACCU-CHEK MULTICLIX) lancets To check glucose daily and as needed for diabetes type 2 100 each 3  ? losartan (COZAAR) 25 MG tablet Take 25 mg by mouth daily.    ? melatonin 1 MG TABS tablet Take 1.5 mg by mouth at bedtime.    ? metoprolol tartrate (LOPRESSOR) 25 MG tablet TAKE 1 TABLET BY MOUTH EVERY DAY 90 tablet 1  ? Multiple Vitamin (MULTIVITAMIN) tablet Take 1 tablet by mouth daily.    ? NON FORMULARY SLEEPS WITH BI-PAP    ? Semaglutide,0.25 or 0.5MG/DOS, (OZEMPIC, 0.25 OR 0.5 MG/DOSE,) 2 MG/1.5ML SOPN Inject 0.5 mg into the skin once a week. 3 mL 3  ? zolpidem (AMBIEN) 10 MG tablet Take 0.5-1 tablets (5-10 mg total) by mouth at bedtime as needed. for sleep 30 tablet 1  ? ?No current facility-administered medications for this visit.  ? ? ? ?PHYSICAL EXAMINATION: ? ?Vitals:  ? 05/26/21 1051  ?BP: 140/61  ?Pulse: 76  ?Resp: 18  ?Temp: (!) 97.2 ?F (36.2 ?C)  ? ?Filed Weights  ? 05/26/21 1051  ?Weight: 252 lb 3.2 oz (114.4 kg)  ? ? ?Physical Exam ?Constitutional:   ?   General: She is not in acute distress. ?HENT:  ?   Head: Normocephalic and atraumatic.  ?Eyes:  ?   General: No scleral icterus. ?Cardiovascular:  ?   Rate and Rhythm: Normal rate and regular rhythm.  ?   Heart sounds: Normal heart sounds.  ?Pulmonary:  ?    Effort: Pulmonary effort is normal. No respiratory distress.  ?   Breath sounds: No wheezing.  ?Abdominal:  ?   General: Bowel sounds are normal. There is no distension.  ?   Palpations: Abdomen is sof

## 2021-05-27 ENCOUNTER — Other Ambulatory Visit: Payer: Self-pay

## 2021-05-27 DIAGNOSIS — R700 Elevated erythrocyte sedimentation rate: Secondary | ICD-10-CM

## 2021-05-29 NOTE — Progress Notes (Deleted)
Cardiology Office Note  Date:  05/29/2021   ID:  Shellie, Rogoff 12-Jan-1956, MRN [id number]  PCP:  Abner Greenspan, MD   No chief complaint on file.   HPI:  [name] is a 66 year old woman with past medical history of obesity Hypertension Diabetes type 2, hemoglobin A1c 6.6 Sleep apnea Hyperlipidemia Nonsmoker,  Hx of UC, coloectomy 1993 Who presents for follow-up of her chest discomfort shortness of breath palpitations  Last seen in clinic by myself June 2019   Lab work reviewed creatinine 1.46, GFR 39 Total cholesterol 82 LDL 15  Stress echocardiogram June 2019 normal LV function, no ischemia Poor functional capacity noted  She reports that over the past several weeks to months she has appreciated a  funny feeling in her chest typically presenting with exertion, especially with stairs Sometimes walking on flat surface, always with exertion No radiation to arm or neck  Hollow feeling,  little sob  Slight fluttering in her upper chest  Denies any significant symptoms at rest  Previous echocardiogram 2016, normal study ejection fraction 60%  Lab work reviewed Total cholesterol 133 LDL 49 Hemoglobin A1c 6.6  EKG personally reviewed by myself on todays visit Reviewed from 07/03/2017 showing normal sinus rhythm with no significant ST or T-wave changes   PMH:   has a past medical history of Anemia, Arthritis, Bowel obstruction ([place]), Chronic kidney disease, Colitis, Diabetes mellitus without complication ([place]), Dyspnea, Gall stones (01/23/2006), Gastroenteritis (07/25/2014), GERD (gastroesophageal reflux disease), History of kidney stones, HTN (hypertension), Hyperglycemia, Obesity, Pneumonia, Rosacea, and Sleep apnea.  PSH:    Past Surgical History:  Procedure Laterality Date   CHOLECYSTECTOMY     COLECTOMY  1993   has ileostomy   COLON SURGERY  2021   removed rectum   COLONOSCOPY  08/1991   COLOSTOMY REVISION  12/06/2011   Procedure: COLOSTOMY  REVISION;  Surgeon: Leighton Ruff, MD;  Location: [facility];  Service: General;  Laterality: Right;  OSTOMY revision   CYSTOSCOPY/URETEROSCOPY/HOLMIUM LASER/STENT PLACEMENT Left 04/11/2021   Procedure: CYSTOSCOPY/URETEROSCOPY/HOLMIUM LASER/STENT PLACEMENT;  Surgeon: Hollice Espy, MD;  Location: [facility];  Service: Urology;  Laterality: Left;   EXAMINATION UNDER ANESTHESIA  12/06/2011   Procedure: EXAM UNDER ANESTHESIA;  Surgeon: Leighton Ruff, MD;  Location: [facility];  Service: General;  Laterality: N/A;  rectal exam under anesthesia, anal dilation, pouchoscopy     TOTAL ABDOMINAL HYSTERECTOMY  05/2006   for abscessed ovaries    Current Outpatient Medications  Medication Sig Dispense Refill   acetaminophen (TYLENOL) 325 MG tablet Take 650 mg by mouth every 6 (six) hours as needed for moderate pain.     albuterol (VENTOLIN HFA) 108 (90 Base) MCG/ACT inhaler Inhale 2 puffs into the lungs every 4 (four) hours as needed for wheezing or shortness of breath. 8 g 2   atorvastatin (LIPITOR) 10 MG tablet TAKE 1/2 PILL BY MOUTH EVERY OTHER DAY 24 tablet 1   Blood Glucose Monitoring Suppl (ACCU-CHEK GUIDE) w/Device KIT To check glucose daily and as needed for type 2 diabetes 1 kit 0   Cholecalciferol (VITAMIN D3) 25 MCG (1000 UT) CAPS Take 1,000 Units by mouth daily.     esomeprazole (NEXIUM) 20 MG capsule Take 20 mg by mouth every other day.     fluticasone (FLONASE) 50 MCG/ACT nasal spray Place 2 sprays into both nostrils at bedtime.     glipiZIDE (GLUCOTROL) 5 MG tablet TAKE 1 TABLET (5 MG TOTAL) BY MOUTH 2 (TWO) TIMES  DAILY BEFORE A MEAL. (Patient taking differently: TAKE 2 TABLET (5 MG TOTAL) BY MOUTH IN THE MORNING AND ONE TABLET IN PM) 180 tablet 0   glucose blood (ACCU-CHEK GUIDE) test strip TO CHECK GLUCOSE DAILY AND AS NEEDED FOR TYPE 2 DIABETES 100 strip 0   hyoscyamine (LEVSIN SL) 0.125 MG SL tablet Take 1 tablet (0.125 mg total) by mouth every 4 (four)  hours as needed. For abdominal cramps 90 tablet 3   Lancets (ACCU-CHEK MULTICLIX) lancets To check glucose daily and as needed for diabetes type 2 100 each 3   losartan (COZAAR) 25 MG tablet Take 25 mg by mouth daily.     melatonin 1 MG TABS tablet Take 1.5 mg by mouth at bedtime.     metoprolol tartrate (LOPRESSOR) 25 MG tablet TAKE 1 TABLET BY MOUTH EVERY DAY 90 tablet 1   Multiple Vitamin (MULTIVITAMIN) tablet Take 1 tablet by mouth daily.     NON FORMULARY SLEEPS WITH BI-PAP     Semaglutide,0.25 or 0.5MG/DOS, (OZEMPIC, 0.25 OR 0.5 MG/DOSE,) 2 MG/1.5ML SOPN Inject 0.5 mg into the skin once a week. 3 mL 3   zolpidem (AMBIEN) 10 MG tablet Take 0.5-1 tablets (5-10 mg total) by mouth at bedtime as needed. for sleep 30 tablet 1   No current facility-administered medications for this visit.     Allergies:   Jardiance [empagliflozin], Lisinopril, Metformin and related, Ciprofloxacin, and Sulfa antibiotics   Social History:  The patient  reports that she has never smoked. She has never used smokeless tobacco. She reports that she does not drink alcohol and does not use drugs.   Family History:   family history includes Allergies in her brother and father; Breast cancer in her paternal grandmother; Breast cancer (age of onset: 35) in her cousin; Cancer in her father; Cirrhosis in her mother; Diabetes in her mother; Gastric cancer in her brother; Hyperlipidemia in her father and mother; Hypertension in her father and mother; Liver cancer in her father; Ulcerative colitis in her father.    Review of Systems: Review of Systems  Constitutional: Negative.   Respiratory: Negative.    Cardiovascular: Negative.   Gastrointestinal: Negative.   Musculoskeletal: Negative.   Neurological: Negative.   Psychiatric/Behavioral: Negative.    All other systems reviewed and are negative.   PHYSICAL EXAM: VS:  There were no vitals taken for this visit. , BMI There is no height or weight on file to calculate  BMI. GEN: Well nourished, well developed, in no acute distress  HEENT: normal  Neck: no JVD, carotid bruits, or masses Cardiac: RRR; no murmurs, rubs, or gallops,no edema  Respiratory:  clear to auscultation bilaterally, normal work of breathing GI: soft, nontender, nondistended, + BS MS: no deformity or atrophy  Skin: warm and dry, no rash Neuro:  Strength and sensation are intact Psych: euthymic mood, full affect   Recent Labs: 04/28/2021: ALT 20; BUN 19; Creatinine, Ser 1.46; Hemoglobin 12.8; Platelets 256; Potassium 4.0; Sodium 134    Lipid Panel Lab Results  Component Value Date   CHOL 82 12/13/2020   HDL 30.90 (L) 12/13/2020   LDLCALC 15 12/13/2020   TRIG 178.0 (H) 12/13/2020      Wt Readings from Last 3 Encounters:  05/26/21 252 lb 3.2 oz (114.4 kg)  05/10/21 250 lb (113.4 kg)  04/28/21 250 lb 14.4 oz (113.8 kg)       ASSESSMENT AND PLAN:  Controlled type 2 diabetes mellitus without complication, without long-term current use of insulin (  HCC) We have encouraged continued exercise, careful diet management in an effort to lose weight.  Ulcerative pancolitis with complication (HCC) Prior colectomy, difficulty staying hydrated Normal BMP several months ago, reviewed with her  Essential hypertension Blood pressure is well controlled on today's visit. No changes made to the medications.  Chest tightness Some risk factors including diabetes,  No significant family history Normal clinical exam and EKG unable to exclude ischemia or arrhythmia as a cause of her symptoms Recommended treadmill stress echo to rule out ischemia If normal study would start a regular exercise program, with aerobic activity for conditioning  Palpitations Etiology unclear, unable to exclude arrhythmia We have ordered a treadmill stress echo as above to reproduce her symptoms Unable to exclude ectopy or other arrhythmia If symptoms get worse, long-term monitor could be ordered Currently  on beta blocker  Disposition:   F/U as needed  Patient was seen in consultation for Dr. Marnie Tower and will be referred back to her office for ongoing care of issues detailed above   Total encounter time more than 60 minutes  Greater than 50% was spent in counseling and coordination of care with the patient   No orders of the defined types were placed in this encounter.    Signed, Tim , M.D., Ph.D. 05/29/2021  [facility], [place] [phone]  

## 2021-05-30 ENCOUNTER — Encounter: Payer: Self-pay | Admitting: Emergency Medicine

## 2021-05-30 ENCOUNTER — Ambulatory Visit: Payer: Medicare PPO | Admitting: Cardiovascular Disease

## 2021-05-30 ENCOUNTER — Other Ambulatory Visit: Payer: Self-pay

## 2021-05-30 ENCOUNTER — Emergency Department
Admission: EM | Admit: 2021-05-30 | Discharge: 2021-05-30 | Disposition: A | Discharge status: Home / Self Care | Payer: Medicare PPO | Attending: Emergency Medicine | Admitting: Emergency Medicine

## 2021-05-30 ENCOUNTER — Emergency Department: Payer: Medicare PPO

## 2021-05-30 DIAGNOSIS — K9419 Other complications of enterostomy: Secondary | ICD-10-CM | POA: Insufficient documentation

## 2021-05-30 DIAGNOSIS — I700 Atherosclerosis of aorta: Secondary | ICD-10-CM | POA: Diagnosis not present

## 2021-05-30 DIAGNOSIS — E860 Dehydration: Secondary | ICD-10-CM | POA: Insufficient documentation

## 2021-05-30 DIAGNOSIS — N200 Calculus of kidney: Secondary | ICD-10-CM | POA: Diagnosis not present

## 2021-05-30 DIAGNOSIS — R198 Other specified symptoms and signs involving the digestive system and abdomen: Secondary | ICD-10-CM

## 2021-05-30 DIAGNOSIS — R197 Diarrhea, unspecified: Secondary | ICD-10-CM

## 2021-05-30 DIAGNOSIS — R1012 Left upper quadrant pain: Secondary | ICD-10-CM | POA: Diagnosis not present

## 2021-05-30 DIAGNOSIS — N189 Chronic kidney disease, unspecified: Secondary | ICD-10-CM | POA: Diagnosis not present

## 2021-05-30 LAB — URINALYSIS, ROUTINE W REFLEX MICROSCOPIC
Bilirubin Urine: NEGATIVE
Glucose, UA: NEGATIVE mg/dL
Hgb urine dipstick: NEGATIVE
Ketones, ur: NEGATIVE mg/dL
Nitrite: NEGATIVE
Protein, ur: NEGATIVE mg/dL
Specific Gravity, Urine: 1.021 (ref 1.005–1.030)
pH: 5 (ref 5.0–8.0)

## 2021-05-30 LAB — COMPREHENSIVE METABOLIC PANEL
ALT: 23 U/L (ref 0–44)
AST: 38 U/L (ref 15–41)
Albumin: 4 g/dL (ref 3.5–5.0)
Alkaline Phosphatase: 71 U/L (ref 38–126)
Anion gap: 11 (ref 5–15)
BUN: 16 mg/dL (ref 8–23)
CO2: 19 mmol/L — ABNORMAL LOW (ref 22–32)
Calcium: 10.1 mg/dL (ref 8.9–10.3)
Chloride: 106 mmol/L (ref 98–111)
Creatinine, Ser: 1.37 mg/dL — ABNORMAL HIGH (ref 0.44–1.00)
GFR, Estimated: 43 mL/min — ABNORMAL LOW (ref >60)
Glucose, Bld: 179 mg/dL — ABNORMAL HIGH (ref 70–99)
Potassium: 4.3 mmol/L (ref 3.5–5.1)
Sodium: 136 mmol/L (ref 135–145)
Total Bilirubin: 1.3 mg/dL — ABNORMAL HIGH (ref 0.3–1.2)
Total Protein: 7.7 g/dL (ref 6.5–8.1)

## 2021-05-30 LAB — CBC
HCT: 44.7 % (ref 36.0–46.0)
Hemoglobin: 13.8 g/dL (ref 12.0–15.0)
MCH: 30.7 pg (ref 26.0–34.0)
MCHC: 30.9 g/dL (ref 30.0–36.0)
MCV: 99.3 fL (ref 80.0–100.0)
Platelets: 293 10*3/uL (ref 150–400)
RBC: 4.5 MIL/uL (ref 3.87–5.11)
RDW: 13.1 % (ref 11.5–15.5)
WBC: 14.6 10*3/uL — ABNORMAL HIGH (ref 4.0–10.5)
nRBC: 0 % (ref 0.0–0.2)

## 2021-05-30 MED ORDER — ONDANSETRON HCL 4 MG/2ML IJ SOLN
4.0000 mg | Freq: Once | INTRAMUSCULAR | Status: AC
  Administered 2021-05-30: 4 mg via INTRAVENOUS
  Filled 2021-05-30: qty 2

## 2021-05-30 MED ORDER — IOHEXOL 300 MG/ML  SOLN
80.0000 mL | Freq: Once | INTRAMUSCULAR | Status: AC | PRN
Start: 2021-05-30 — End: 2021-05-30
  Administered 2021-05-30: 80 mL via INTRAVENOUS
  Filled 2021-05-30: qty 80

## 2021-05-30 MED ORDER — LACTATED RINGERS IV BOLUS
1000.0000 mL | Freq: Once | INTRAVENOUS | Status: AC
Start: 2021-05-30 — End: 2021-05-30
  Administered 2021-05-30: 1000 mL via INTRAVENOUS

## 2021-05-30 MED ORDER — FOSFOMYCIN TROMETHAMINE 3 G PO PACK
3.0000 g | PACK | Freq: Once | ORAL | Status: AC
  Administered 2021-05-30: 3 g via ORAL
  Filled 2021-05-30: qty 3

## 2021-05-30 NOTE — Discharge Instructions (Signed)
As we discussed, you got a single dose of an antibiotic called fosfomycin to address the possibility of a bladder infection/UTI. ? ?Your urine culture will be running in the background over the next couple days to tease out if this is a true infection and to ensure the antibiotic we gave you we will treat it. ? ?If you develop any further worsening symptoms at home such as fevers, worsening pain or high output, then please return to the ED. ?

## 2021-05-30 NOTE — ED Triage Notes (Signed)
Pt states has an illeostomy and has been having diarrhea tonight. Pt denies fever, vomiting, states has felt gassy, but no abd pain.  ?

## 2021-05-30 NOTE — ED Provider Notes (Signed)
? ?[facility] ?Provider Note ? ? ? Event Date/Time  ? First MD Initiated Contact with Patient 05/30/21 [phone]   ?  (approximate) ? ? ?History  ? ?Diarrhea ? ? ?HPI ? ?Jamie [name] is a 66 y.o. female who presents to the ED for evaluation of Diarrhea ?  ?I review Urology visit from 4/18. Hx ureteral stone, 3/20 ureterosctopy. Remote Colectomy with ileostomy in the 1990s due to UC. CKD.  ?January 2021 [facility] robot-assisted redo of end ileostomy and with ileal pouch excision. ? ?Patient presents to the ED, accompanied by her husband, for evaluation of increased liquid output from her ostomy with some associated gassy cramping throughout her abdomen.  She reports symptoms started yesterday afternoon have been persistent since then. She has had to empty her ostomy up to every 30 minutes. No emesis. ? ?Decreased urinary output without dysuria.  ? ? ?Physical Exam  ? ?Triage Vital Signs: ?ED Triage Vitals  ?Enc Vitals Group  ?   BP 05/30/21 0507 (!) 135/55  ?   Pulse Rate 05/30/21 0507 99  ?   Resp 05/30/21 0507 16  ?   Temp 05/30/21 0507 98.6 ?F (37 ?C)  ?   Temp Source 05/30/21 0507 Oral  ?   SpO2 05/30/21 0507 96 %  ?   Weight 05/30/21 0508 245 lb (111.1 kg)  ?   Height 05/30/21 0508 5' 5"  (1.651 m)  ?   Head Circumference --   ?   Peak Flow --   ?   Pain Score 05/30/21 0508 0  ?   Pain Loc --   ?   Pain Edu? --   ?   Excl. in [place]? --   ? ? ?Most recent vital signs: ?Vitals:  ? 05/30/21 0745 05/30/21 1000  ?BP: 130/60 130/62  ?Pulse: 88 87  ?Resp: 16 18  ?Temp:    ?SpO2: 96% 96%  ? ? ?General: Awake, no distress. Sitting upright on bedside chair.  Looks well and is conversational. ?CV:  Good peripheral perfusion.  ?Resp:  Normal effort.  ?Abd:  No distention.  Right-sided ostomy in place, bag is mostly empty, but with small amount of yellow liquid output.  She has some mild tenderness to left-sided left upper abdomen without peritoneal features or guarding.  Otherwise benign. ?MSK:  No deformity  noted.  ?Neuro:  No focal deficits appreciated. ?Other:   ? ? ?ED Results / Procedures / Treatments  ? ?Labs ?(all labs ordered are listed, but only abnormal results are displayed) ?Labs Reviewed  ?COMPREHENSIVE METABOLIC PANEL - Abnormal; Notable for the following components:  ?    Result Value  ? CO2 19 (*)   ? Glucose, Bld 179 (*)   ? Creatinine, Ser 1.37 (*)   ? Total Bilirubin 1.3 (*)   ? GFR, Estimated 43 (*)   ? All other components within normal limits  ?CBC - Abnormal; Notable for the following components:  ? WBC 14.6 (*)   ? All other components within normal limits  ?URINALYSIS, ROUTINE W REFLEX MICROSCOPIC - Abnormal; Notable for the following components:  ? Color, Urine YELLOW (*)   ? APPearance HAZY (*)   ? Leukocytes,Ua SMALL (*)   ? Bacteria, UA FEW (*)   ? All other components within normal limits  ?URINE CULTURE  ? ? ?EKG ? ? ?RADIOLOGY ?CT abdomen/pelvis reviewed by me without clear obstructive pathology ? ?Official radiology report(s): ?CT ABDOMEN PELVIS W CONTRAST ? ?Result Date: 05/30/2021 ?  CLINICAL DATA:  Status post colectomy with ileostomy.  Diarrhea. EXAM: CT ABDOMEN AND PELVIS WITH CONTRAST TECHNIQUE: Multidetector CT imaging of the abdomen and pelvis was performed using the standard protocol following bolus administration of intravenous contrast. RADIATION DOSE REDUCTION: This exam was performed according to the departmental dose-optimization program which includes automated exposure control, adjustment of the mA and/or kV according to patient size and/or use of iterative reconstruction technique. CONTRAST:  66m OMNIPAQUE IOHEXOL 300 MG/ML  SOLN COMPARISON:  February 23, 2021. FINDINGS: Lower chest: No acute abnormality. Hepatobiliary: Status post cholecystectomy. No biliary dilatation is noted. Nodular hepatic contours are noted consistent with hepatic cirrhosis. Pancreas: Unremarkable. No pancreatic ductal dilatation or surrounding inflammatory changes. Spleen: Normal in size without  focal abnormality. Adrenals/Urinary Tract: Adrenal glands appear normal. Nonobstructive left nephrolithiasis is noted. No hydronephrosis or renal obstruction is noted. Urinary bladder is unremarkable. Stomach/Bowel: The stomach is unremarkable. Status post colectomy with ileostomy in the right lower quadrant. No abnormal bowel dilatation is noted. Vascular/Lymphatic: Aortic atherosclerosis. No enlarged abdominal or pelvic lymph nodes. Reproductive: Status post hysterectomy. No adnexal masses. Other: No definite hernia or ascites is noted. There are again noted stable calcifications along the medial and inferior aspect of the right hepatic lobe which may represent dropped gallstones. Musculoskeletal: No acute or significant osseous findings. IMPRESSION: Nodular hepatic contours are noted suggesting hepatic cirrhosis. Status post colectomy with ileostomy in right lower quadrant. No abnormal bowel dilatation is noted. Nonobstructive left nephrolithiasis. Aortic Atherosclerosis (ICD10-I70.0). Electronically Signed   By: JMarijo ConceptionM.D.   On: 05/30/2021 09:06   ? ?PROCEDURES and INTERVENTIONS: ? ?Procedures ? ?Medications  ?fosfomycin (MONUROL) packet 3 g (has no administration in time range)  ?ondansetron ([facility] injection 4 mg (4 mg Intravenous Given 05/30/21 0818)  ?lactated ringers bolus 1,000 mL (0 mLs Intravenous Stopped 05/30/21 1104)  ?iohexol (OMNIPAQUE) 300 MG/ML solution 80 mL (80 mLs Intravenous Contrast Given 05/30/21 0844)  ? ? ? ?IMPRESSION / MDM / ASSESSMENT AND PLAN / ED COURSE  ?I reviewed the triage vital signs and the nursing notes. ? ?66year old female with longstanding end ileostomy presents to the ED with increased output, possibly due to acute cystitis and ultimately suitable for outpatient management.  She looks systemically well to me, but has some mild upper abdominal tenderness without peritoneal features or guarding.  Blood work with nonspecific leukocytosis, CKD around baseline and slight  decrease in bicarb suggestive of fluid loss and dehydration.  Urine with small leukocytes and a couple squames.  She does not have urinary symptoms, but reports a history of UTI.  Alongside her leukocytosis and history, cystitis is a possibility to cause her increased output.  Urine was sent for a culture and she was provided a single dose of fosfomycin.  I briefly considered observation admission, but ultimately not warranted considering her improvement with IV fluids and Zofran.  We discussed management at home and return precautions.  She is suitable for outpatient management. ? ?Clinical Course as of 05/30/21 1124  ?Mon May 30, 2021  ?0925 Reassessed.  Feeling okay.  We discussed reassuring CT scan.  Finishing IV fluids, p.o. challenge and possible outpatient management thereafter. [DS]  ?10102Reassessed and clarified urinary symptoms.  We discussed UTI possibility and equivocal urinalysis.  We discussed 3 options: Urine culture in the background and no treatment, full treatment and fosfomycin as we await urine culture.  She would prefer to get the single dose of fosfomycin and then await the urine culture results and  go home. [DS]  ?  ?Clinical Course User Index ?[DS] Vladimir Crofts, MD  ? ? ? ?FINAL CLINICAL IMPRESSION(S) / ED DIAGNOSES  ? ?Final diagnoses:  ?Diarrhea, unspecified type  ?Dehydration  ?Increased ileostomy output ([place])  ? ? ? ?Rx / DC Orders  ? ?ED Discharge Orders   ? ? None  ? ?  ? ? ? ?Note:  This document was prepared using Dragon voice recognition software and may include unintentional dictation errors. ?  ?Vladimir Crofts, MD ?05/30/21 1126 ? ?

## 2021-05-31 ENCOUNTER — Ambulatory Visit (INDEPENDENT_AMBULATORY_CARE_PROVIDER_SITE_OTHER): Payer: Medicare PPO

## 2021-05-31 VITALS — Ht 65.5 in | Wt 245.0 lb

## 2021-05-31 DIAGNOSIS — Z0000 Encounter for general adult medical examination without abnormal findings: Secondary | ICD-10-CM

## 2021-05-31 LAB — URINE CULTURE

## 2021-05-31 NOTE — Patient Instructions (Signed)
Ms. Fretz , ?Thank you for taking time to come for your Medicare Wellness Visit. I appreciate your ongoing commitment to your health goals. Please review the following plan we discussed and let me know if I can assist you in the future.  ? ?Screening recommendations/referrals: ?Colonoscopy: not required ?Mammogram: completed 08/11/2020, due 08/12/2021 ?Bone Density: completed 01/13/2020 ?Recommended yearly ophthalmology/optometry visit for glaucoma screening and checkup ?Recommended yearly dental visit for hygiene and checkup ? ?Vaccinations: ?Influenza vaccine: due 08/23/2021 ?Pneumococcal vaccine: due ?Tdap vaccine: completed 10/02/2012, due 10/03/2022 ?Shingles vaccine: completed    ?Covid-19:10/12/2020, 05/31/2020, 11/18/2019, 05/05/2019, 04/14/2019 ? ?Advanced directives: Please bring a copy of your POA (Power of Attorney) and/or Living Will to your next appointment.  ? ?Conditions/risks identified: none ? ?Next appointment: Follow up in one year for your annual wellness visit  ? ? ?Preventive Care 67 Years and Older, Female ?Preventive care refers to lifestyle choices and visits with your health care provider that can promote health and wellness. ?What does preventive care include? ?A yearly physical exam. This is also called an annual well check. ?Dental exams once or twice a year. ?Routine eye exams. Ask your health care provider how often you should have your eyes checked. ?Personal lifestyle choices, including: ?Daily care of your teeth and gums. ?Regular physical activity. ?Eating a healthy diet. ?Avoiding tobacco and drug use. ?Limiting alcohol use. ?Practicing safe sex. ?Taking low-dose aspirin every day. ?Taking vitamin and mineral supplements as recommended by your health care provider. ?What happens during an annual well check? ?The services and screenings done by your health care provider during your annual well check will depend on your age, overall health, lifestyle risk factors, and family history of  disease. ?Counseling  ?Your health care provider may ask you questions about your: ?Alcohol use. ?Tobacco use. ?Drug use. ?Emotional well-being. ?Home and relationship well-being. ?Sexual activity. ?Eating habits. ?History of falls. ?Memory and ability to understand (cognition). ?Work and work Statistician. ?Reproductive health. ?Screening  ?You may have the following tests or measurements: ?Height, weight, and BMI. ?Blood pressure. ?Lipid and cholesterol levels. These may be checked every 5 years, or more frequently if you are over 43 years old. ?Skin check. ?Lung cancer screening. You may have this screening every year starting at age 62 if you have a 30-pack-year history of smoking and currently smoke or have quit within the past 15 years. ?Fecal occult blood test (FOBT) of the stool. You may have this test every year starting at age 32. ?Flexible sigmoidoscopy or colonoscopy. You may have a sigmoidoscopy every 5 years or a colonoscopy every 10 years starting at age 80. ?Hepatitis C blood test. ?Hepatitis B blood test. ?Sexually transmitted disease (STD) testing. ?Diabetes screening. This is done by checking your blood sugar (glucose) after you have not eaten for a while (fasting). You may have this done every 1-3 years. ?Bone density scan. This is done to screen for osteoporosis. You may have this done starting at age 58. ?Mammogram. This may be done every 1-2 years. Talk to your health care provider about how often you should have regular mammograms. ?Talk with your health care provider about your test results, treatment options, and if necessary, the need for more tests. ?Vaccines  ?Your health care provider may recommend certain vaccines, such as: ?Influenza vaccine. This is recommended every year. ?Tetanus, diphtheria, and acellular pertussis (Tdap, Td) vaccine. You may need a Td booster every 10 years. ?Zoster vaccine. You may need this after age 49. ?Pneumococcal 13-valent conjugate (PCV13)  vaccine. One  dose is recommended after age 26. ?Pneumococcal polysaccharide (PPSV23) vaccine. One dose is recommended after age 74. ?Talk to your health care provider about which screenings and vaccines you need and how often you need them. ?This information is not intended to replace advice given to you by your health care provider. Make sure you discuss any questions you have with your health care provider. ?Document Released: 02/05/2015 Document Revised: 09/29/2015 Document Reviewed: 11/10/2014 ?Elsevier Interactive Patient Education ? 2017 [place]. ? ?Fall Prevention in the Home ?Falls can cause injuries. They can happen to people of all ages. There are many things you can do to make your home safe and to help prevent falls. ?What can I do on the outside of my home? ?Regularly fix the edges of walkways and driveways and fix any cracks. ?Remove anything that might make you trip as you walk through a door, such as a raised step or threshold. ?Trim any bushes or trees on the path to your home. ?Use bright outdoor lighting. ?Clear any walking paths of anything that might make someone trip, such as rocks or tools. ?Regularly check to see if handrails are loose or broken. Make sure that both sides of any steps have handrails. ?Any raised decks and porches should have guardrails on the edges. ?Have any leaves, snow, or ice cleared regularly. ?Use sand or salt on walking paths during winter. ?Clean up any spills in your garage right away. This includes oil or grease spills. ?What can I do in the bathroom? ?Use night lights. ?Install grab bars by the toilet and in the tub and shower. Do not use towel bars as grab bars. ?Use non-skid mats or decals in the tub or shower. ?If you need to sit down in the shower, use a plastic, non-slip stool. ?Keep the floor dry. Clean up any water that spills on the floor as soon as it happens. ?Remove soap buildup in the tub or shower regularly. ?Attach bath mats securely with double-sided  non-slip rug tape. ?Do not have throw rugs and other things on the floor that can make you trip. ?What can I do in the bedroom? ?Use night lights. ?Make sure that you have a light by your bed that is easy to reach. ?Do not use any sheets or blankets that are too big for your bed. They should not hang down onto the floor. ?Have a firm chair that has side arms. You can use this for support while you get dressed. ?Do not have throw rugs and other things on the floor that can make you trip. ?What can I do in the kitchen? ?Clean up any spills right away. ?Avoid walking on wet floors. ?Keep items that you use a lot in easy-to-reach places. ?If you need to reach something above you, use a strong step stool that has a grab bar. ?Keep electrical cords out of the way. ?Do not use floor polish or wax that makes floors slippery. If you must use wax, use non-skid floor wax. ?Do not have throw rugs and other things on the floor that can make you trip. ?What can I do with my stairs? ?Do not leave any items on the stairs. ?Make sure that there are handrails on both sides of the stairs and use them. Fix handrails that are broken or loose. Make sure that handrails are as long as the stairways. ?Check any carpeting to make sure that it is firmly attached to the stairs. Fix any carpet that is loose  or worn. ?Avoid having throw rugs at the top or bottom of the stairs. If you do have throw rugs, attach them to the floor with carpet tape. ?Make sure that you have a light switch at the top of the stairs and the bottom of the stairs. If you do not have them, ask someone to add them for you. ?What else can I do to help prevent falls? ?Wear shoes that: ?Do not have high heels. ?Have rubber bottoms. ?Are comfortable and fit you well. ?Are closed at the toe. Do not wear sandals. ?If you use a stepladder: ?Make sure that it is fully opened. Do not climb a closed stepladder. ?Make sure that both sides of the stepladder are locked into place. ?Ask  someone to hold it for you, if possible. ?Clearly mark and make sure that you can see: ?Any grab bars or handrails. ?First and last steps. ?Where the edge of each step is. ?Use tools that help you move around (mob

## 2021-05-31 NOTE — Progress Notes (Signed)
?I connected with Jamie [name] today by telephone and verified that I am speaking with the correct person using two identifiers. ?Location patient: home ?Location provider: work ?Persons participating in the virtual visit: Jamie [name], [name]. ?  ?I discussed the limitations, risks, security and privacy concerns of performing an evaluation and management service by telephone and the availability of in person appointments. I also discussed with the patient that there may be a patient responsible charge related to this service. The patient expressed understanding and verbally consented to this telephonic visit.  ?  ?Interactive audio and video telecommunications were attempted between this provider and patient, however failed, due to patient having technical difficulties OR patient did not have access to video capability.  We continued and completed visit with audio only. ? ?  ? ?Vital signs may be patient reported or missing. ? ?Subjective:  ? Jamie [name] is a 66 y.o. female who presents for an Initial Medicare Annual Wellness Visit. ? ?Review of Systems    ? ?Cardiac Risk Factors include: advanced age (>2mn, >>37women);diabetes mellitus;dyslipidemia;hypertension;obesity (BMI >30kg/m2) ? ?   ?Objective:  ?  ?Today's Vitals  ? 05/31/21 1026  ?Weight: 245 lb (111.1 kg)  ?Height: 5' 5.5" (1.664 m)  ? ?Body mass index is 40.15 kg/m?. ? ? ?  05/31/2021  ? 10:33 AM 05/30/2021  ?  7:31 AM 05/30/2021  ?  5:09 AM 05/26/2021  ? 10:47 AM 04/28/2021  ?  3:05 PM 04/11/2021  ? 11:22 AM 04/04/2021  ? 10:40 AM  ?Advanced Directives  ?Does Patient Have a Medical Advance Directive? Yes  Yes Yes Yes No No  ?Type of [place]  Living will;Healthcare Power of Attorney Living will;Healthcare Power of [place]    ?Does patient want to make changes to medical advance directive?  No - Guardian declined       ?[place] of [place]? No - copy requested    No - copy requested    ?Would patient like information on creating a medical advance directive?      No - Patient declined   ? ? ?Current Medications (verified) ?Outpatient Encounter Medications as of 05/31/2021  ?Medication Sig  ? acetaminophen (TYLENOL) 325 MG tablet Take 650 mg by mouth every 6 (six) hours as needed for moderate pain.  ? albuterol (VENTOLIN HFA) 108 (90 Base) MCG/ACT inhaler Inhale 2 puffs into the lungs every 4 (four) hours as needed for wheezing or shortness of breath.  ? atorvastatin (LIPITOR) 10 MG tablet TAKE 1/2 PILL BY MOUTH EVERY OTHER DAY  ? Blood Glucose Monitoring Suppl (ACCU-CHEK GUIDE) w/Device KIT To check glucose daily and as needed for type 2 diabetes  ? Cholecalciferol (VITAMIN D3) 25 MCG (1000 UT) CAPS Take 1,000 Units by mouth daily.  ? esomeprazole (NEXIUM) 20 MG capsule Take 20 mg by mouth every other day.  ? fluticasone (FLONASE) 50 MCG/ACT nasal spray Place 2 sprays into both nostrils at bedtime.  ? glipiZIDE (GLUCOTROL) 5 MG tablet TAKE 1 TABLET (5 MG TOTAL) BY MOUTH 2 (TWO) TIMES DAILY BEFORE A MEAL. (Patient taking differently: TAKE 2 TABLET (5 MG TOTAL) BY MOUTH IN THE MORNING AND ONE TABLET IN PM)  ? glucose blood (ACCU-CHEK GUIDE) test strip TO CHECK GLUCOSE DAILY AND AS NEEDED FOR TYPE 2 DIABETES  ? hyoscyamine (LEVSIN SL) 0.125 MG SL tablet Take 1 tablet (0.125 mg total) by mouth every  4 (four) hours as needed. For abdominal cramps  ? Lancets (ACCU-CHEK MULTICLIX) lancets To check glucose daily and as needed for diabetes type 2  ? losartan (COZAAR) 25 MG tablet Take 25 mg by mouth daily.  ? melatonin 1 MG TABS tablet Take 1.5 mg by mouth at bedtime.  ? metoprolol tartrate (LOPRESSOR) 25 MG tablet TAKE 1 TABLET BY MOUTH EVERY DAY  ? Multiple Vitamin (MULTIVITAMIN) tablet Take 1 tablet by mouth daily.  ? NON FORMULARY SLEEPS WITH BI-PAP  ? Semaglutide,0.25 or 0.5MG/DOS, (OZEMPIC, 0.25 OR 0.5 MG/DOSE,) 2 MG/1.5ML SOPN Inject 0.5  mg into the skin once a week.  ? zolpidem (AMBIEN) 10 MG tablet Take 0.5-1 tablets (5-10 mg total) by mouth at bedtime as needed. for sleep (Patient not taking: Reported on 05/31/2021)  ? ?No facility-administered encounter medications on file as of 05/31/2021.  ? ? ?Allergies (verified) ?Jardiance [empagliflozin], Lisinopril, Metformin and related, Ciprofloxacin, and Sulfa antibiotics  ? ?History: ?Past Medical History:  ?Diagnosis Date  ? Anemia   ? Arthritis   ? generalized  ? Bowel obstruction ([place])   ? repeated (? possible adhesions) neg EGD  ? Chronic kidney disease   ? Colitis   ? with colectomy  ? Diabetes mellitus without complication ([place])   ? Dyspnea   ? covid  ? Gall stones 01/23/2006  ? Gastroenteritis 07/25/2014  ? hosp for IVF   ? GERD (gastroesophageal reflux disease)   ? History of kidney stones   ? HTN (hypertension)   ? Hyperglycemia   ? mild-monitors A1C  ? Obesity   ? Pneumonia   ? Rosacea   ? Sleep apnea   ? CPAP everynight  ? ?Past Surgical History:  ?Procedure Laterality Date  ? CHOLECYSTECTOMY    ? COLECTOMY  1993  ? has ileostomy  ? COLON SURGERY  2021  ? removed rectum  ? COLONOSCOPY  08/1991  ? COLOSTOMY REVISION  12/06/2011  ? Procedure: COLOSTOMY REVISION;  Surgeon: Leighton Ruff, MD;  Location: [facility];  Service: General;  Laterality: Right;  OSTOMY revision  ? CYSTOSCOPY/URETEROSCOPY/HOLMIUM LASER/STENT PLACEMENT Left 04/11/2021  ? Procedure: CYSTOSCOPY/URETEROSCOPY/HOLMIUM LASER/STENT PLACEMENT;  Surgeon: Hollice Espy, MD;  Location: [facility];  Service: Urology;  Laterality: Left;  ? EXAMINATION UNDER ANESTHESIA  12/06/2011  ? Procedure: EXAM UNDER ANESTHESIA;  Surgeon: Leighton Ruff, MD;  Location: [facility];  Service: General;  Laterality: N/A;  rectal exam under anesthesia, anal dilation, pouchoscopy  ?  ? TOTAL ABDOMINAL HYSTERECTOMY  05/2006  ? for abscessed ovaries  ? ?Family History  ?Problem Relation Age of Onset  ? Hypertension Mother   ?  Hyperlipidemia Mother   ? Cirrhosis Mother   ?     NASH  ? Diabetes Mother   ? Liver cancer Father   ?     resection secondary to mets  ? Cancer Father   ?     colon  ? Hyperlipidemia Father   ? Hypertension Father   ? Allergies Father   ? Ulcerative colitis Father   ? Allergies Brother   ? Gastric cancer Brother   ? Breast cancer Paternal Grandmother   ? Breast cancer Cousin 60  ?     paternal cousins  ? ?Social History  ? ?Socioeconomic History  ? Marital status: Married  ?  Spouse name: Delfino Lovett  ? Number of children: 3  ? Years of education: Not on file  ? Highest education level: Not on file  ?Occupational History  ?  Occupation: Guardian Ad Litem  ?Tobacco Use  ? Smoking status: Never  ? Smokeless tobacco: Never  ?Vaping Use  ? Vaping Use: Never used  ?Substance and Sexual Activity  ? Alcohol use: No  ?  Alcohol/week: 0.0 standard drinks  ? Drug use: No  ? Sexual activity: Yes  ?Other Topics Concern  ? Not on file  ?Social History Narrative  ? Not on file  ? ?Social Determinants of Health  ? ?Financial Resource Strain: Low Risk   ? Difficulty of Paying Living Expenses: Not hard at all  ?Food Insecurity: No Food Insecurity  ? Worried About Charity fundraiser in the Last Year: Never true  ? Ran Out of Food in the Last Year: Never true  ?Transportation Needs: No Transportation Needs  ? Lack of Transportation (Medical): No  ? Lack of Transportation (Non-Medical): No  ?Physical Activity: Inactive  ? Days of Exercise per Week: 0 days  ? Minutes of Exercise per Session: 0 min  ?Stress: No Stress Concern Present  ? Feeling of Stress : Not at all  ?Social Connections: Not on file  ? ? ?Tobacco Counseling ?Counseling given: Not Answered ? ? ?Clinical Intake: ? ?Pre-visit preparation completed: Yes ? ?Pain : No/denies pain ? ?  ? ?Nutritional Status: BMI > 30  Obese ?Nutritional Risks: Nausea/ vomitting/ diarrhea (diarrhea started Sunday afternoon, resolved) ?Diabetes: Yes ? ?How often do you need to have someone help you  when you read instructions, pamphlets, or other written materials from your doctor or pharmacy?: 1 - Never ?What is the last grade level you completed in school?: college ? ?Diabetic? Yes ?Nutrition Risk Asse

## 2021-06-11 ENCOUNTER — Other Ambulatory Visit: Payer: Self-pay | Admitting: Family Medicine

## 2021-06-15 DIAGNOSIS — M533 Sacrococcygeal disorders, not elsewhere classified: Secondary | ICD-10-CM | POA: Diagnosis not present

## 2021-06-15 DIAGNOSIS — M47816 Spondylosis without myelopathy or radiculopathy, lumbar region: Secondary | ICD-10-CM | POA: Diagnosis not present

## 2021-06-15 DIAGNOSIS — M545 Low back pain, unspecified: Secondary | ICD-10-CM | POA: Diagnosis not present

## 2021-06-15 DIAGNOSIS — M47896 Other spondylosis, lumbar region: Secondary | ICD-10-CM | POA: Diagnosis not present

## 2021-06-29 DIAGNOSIS — M47896 Other spondylosis, lumbar region: Secondary | ICD-10-CM | POA: Diagnosis not present

## 2021-06-29 DIAGNOSIS — M47816 Spondylosis without myelopathy or radiculopathy, lumbar region: Secondary | ICD-10-CM | POA: Diagnosis not present

## 2021-06-29 DIAGNOSIS — M545 Low back pain, unspecified: Secondary | ICD-10-CM | POA: Diagnosis not present

## 2021-07-06 ENCOUNTER — Other Ambulatory Visit: Payer: Self-pay | Admitting: Family Medicine

## 2021-07-12 DIAGNOSIS — M545 Low back pain, unspecified: Secondary | ICD-10-CM | POA: Diagnosis not present

## 2021-07-12 NOTE — Progress Notes (Unsigned)
Cardiology Office Note  Date:  07/13/2021   ID:  Jamie [name], [name] [birth year], MRN [id number]  PCP:  Abner Greenspan, MD   Chief Complaint  Patient presents with   New Patient (Initial Visit)    Ref by Dr. Glori Bickers for abnormal EKG. Patient c/o palpitations, shortness of breath with occasional chest pain when climbing stairs. Medications reviewed by the a patient verbally.     HPI:  [name] is a 66 year old woman with past medical history of obesity Hypertension Diabetes type 2, hemoglobin A1c 7 to 9.9 Sleep apnea Hyperlipidemia Nonsmoker,  Hx of UC, coloectomy 1993 Previously seen in 2019 for chest discomfort shortness of breath palpitations Who presents by referral from Dr. Glori Bickers for consultation of her abnormal EKG  Last seen in clinic by myself 2019  In follow-up today reports she is relatively sedentary, has had numerous medical issues Recent issues with kidney stones among other things She does report having some Sob on exertion, no significant chest pain on exertion  Had COVID December 2022, seen by pulmonary started on albuterol which she does not use  Lab work reviewed Total chol 82, LDL 15 Typically 132 on cholesterol  Has ileostomy x 30 years  EKG personally reviewed by myself on todays visit Normal sinus rhythm rate 87 bpm no significant ST-T wave changes  Other past medical history reviewed Previous echocardiogram 2016, normal study ejection fraction 60%  Stress echo 06/2017  PMH:   has a past medical history of Anemia, Arthritis, Bowel obstruction ([place]), Chronic kidney disease, Colitis, Diabetes mellitus without complication ([place]), Dyspnea, Gall stones (01/23/2006), Gastroenteritis (07/25/2014), GERD (gastroesophageal reflux disease), History of kidney stones, HTN (hypertension), Hyperglycemia, Obesity, Pneumonia, Rosacea, and Sleep apnea.  PSH:    Past Surgical History:  Procedure Laterality Date   CHOLECYSTECTOMY     COLECTOMY  1993   has  ileostomy   COLON SURGERY  2021   removed rectum   COLONOSCOPY  08/1991   COLOSTOMY REVISION  12/06/2011   Procedure: COLOSTOMY REVISION;  Surgeon: Leighton Ruff, MD;  Location[facility];  Service: General;  Laterality: Right;  OSTOMY revision   CYSTOSCOPY/URETEROSCOPY/HOLMIUM LASER/STENT PLACEMENT Left 04/11/2021   Procedure: CYSTOSCOPY/URETEROSCOPY/HOLMIUM LASER/STENT PLACEMENT;  Surgeon: Hollice Espy, MD;  Location: [facility];  Service: Urology;  Laterality: Left;   EXAMINATION UNDER ANESTHESIA  12/06/2011   Procedure: EXAM UNDER ANESTHESIA;  Surgeon: Leighton Ruff, MD;  Location: [facility];  Service: General;  Laterality: N/A;  rectal exam under anesthesia, anal dilation, pouchoscopy     TOTAL ABDOMINAL HYSTERECTOMY  05/2006   for abscessed ovaries    Current Outpatient Medications  Medication Sig Dispense Refill   ACCU-CHEK GUIDE test strip TO CHECK GLUCOSE DAILY AND AS NEEDED FOR TYPE 2 DIABETES 100 strip 0   acetaminophen (TYLENOL) 325 MG tablet Take 650 mg by mouth every 6 (six) hours as needed for moderate pain.     albuterol (VENTOLIN HFA) 108 (90 Base) MCG/ACT inhaler Inhale 2 puffs into the lungs every 4 (four) hours as needed for wheezing or shortness of breath. 8 g 2   atorvastatin (LIPITOR) 10 MG tablet TAKE 1/2 PILL BY MOUTH EVERY OTHER DAY 24 tablet 1   Blood Glucose Monitoring Suppl (ACCU-CHEK GUIDE) w/Device KIT To check glucose daily and as needed for type 2 diabetes 1 kit 0   Cholecalciferol (VITAMIN D3) 25 MCG (1000 UT) CAPS Take 1,000 Units by mouth daily.     esomeprazole (NEXIUM) 20 MG capsule  Take 20 mg by mouth every other day.     fluticasone (FLONASE) 50 MCG/ACT nasal spray Place 2 sprays into both nostrils at bedtime.     glipiZIDE (GLUCOTROL) 5 MG tablet TAKE 1 TABLET (5 MG TOTAL) BY MOUTH 2 (TWO) TIMES DAILY BEFORE A MEAL. (Patient taking differently: TAKE 2 TABLET (5 MG TOTAL) BY MOUTH IN THE MORNING AND ONE TABLET IN PM)  180 tablet 0   hyoscyamine (LEVSIN SL) 0.125 MG SL tablet Take 1 tablet (0.125 mg total) by mouth every 4 (four) hours as needed. For abdominal cramps 90 tablet 3   Lancets (ACCU-CHEK MULTICLIX) lancets To check glucose daily and as needed for diabetes type 2 100 each 3   losartan (COZAAR) 25 MG tablet Take 25 mg by mouth daily.     melatonin 1 MG TABS tablet Take 1.5 mg by mouth at bedtime.     metoprolol tartrate (LOPRESSOR) 25 MG tablet TAKE 1 TABLET BY MOUTH EVERY DAY 90 tablet 1   Multiple Vitamin (MULTIVITAMIN) tablet Take 1 tablet by mouth daily.     NON FORMULARY SLEEPS WITH BI-PAP     Semaglutide,0.25 or 0.5MG/DOS, (OZEMPIC, 0.25 OR 0.5 MG/DOSE,) 2 MG/1.5ML SOPN Inject 0.5 mg into the skin once a week. 3 mL 3   zolpidem (AMBIEN) 10 MG tablet Take 0.5-1 tablets (5-10 mg total) by mouth at bedtime as needed. for sleep (Patient not taking: Reported on 05/31/2021) 30 tablet 1   No current facility-administered medications for this visit.    Allergies:   Ciprofloxacin, Other, Jardiance [empagliflozin], Lisinopril, Metformin and related, and Sulfa antibiotics   Social History:  The patient  reports that she has never smoked. She has never used smokeless tobacco. She reports that she does not drink alcohol and does not use drugs.   Family History:   family history includes Allergies in her brother and father; Breast cancer in her paternal grandmother; Breast cancer (age of onset: 63) in her cousin; Cancer in her father; Cirrhosis in her mother; Diabetes in her mother; Gastric cancer in her brother; Hyperlipidemia in her father and mother; Hypertension in her father and mother; Liver cancer in her father; Ulcerative colitis in her father.    Review of Systems: Review of Systems  Constitutional: Negative.   Respiratory: Negative.    Cardiovascular: Negative.   Gastrointestinal: Negative.   Musculoskeletal: Negative.   Neurological: Negative.   Psychiatric/Behavioral: Negative.    All  other systems reviewed and are negative.   PHYSICAL EXAM: VS:  BP 110/70 (BP Location: Right Wrist, Patient Position: Sitting, Cuff Size: Normal)   Pulse 87   Ht 5' 5.5" (1.664 m)   Wt 250 lb (113.4 kg)   SpO2 98%   BMI 40.97 kg/m  , BMI Body mass index is 40.97 kg/m. GEN: Well nourished, well developed, in no acute distress  HEENT: normal  Neck: no JVD, carotid bruits, or masses Cardiac: RRR; no murmurs, rubs, or gallops,no edema  Respiratory:  clear to auscultation bilaterally, normal work of breathing GI: soft, nontender, nondistended, + BS MS: no deformity or atrophy  Skin: warm and dry, no rash Neuro:  Strength and sensation are intact Psych: euthymic mood, full affect  Recent Labs: 05/30/2021: ALT 23; BUN 16; Creatinine, Ser 1.37; Hemoglobin 13.8; Platelets 293; Potassium 4.3; Sodium 136    Lipid Panel Lab Results  Component Value Date   CHOL 82 12/13/2020   HDL 30.90 (L) 12/13/2020   LDLCALC 15 12/13/2020   TRIG 178.0 (H) 12/13/2020  Wt Readings from Last 3 Encounters:  07/13/21 250 lb (113.4 kg)  05/31/21 245 lb (111.1 kg)  05/30/21 245 lb (111.1 kg)     ASSESSMENT AND PLAN:  Controlled type 2 diabetes mellitus without complication, without long-term current use of insulin ([place]) On Ozempic numbers are improving We have encouraged continued exercise, careful diet management in an effort to lose weight.  Ulcerative pancolitis with complication ([place]) History of ileostomy 30 years ago  Essential hypertension Blood pressure is well controlled on today's visit. No changes made to the medications.  Chest tightness Denies significant chest pain on exertion Prior stress testing 2019, no significant change in EKG since that time  Shortness of breath Mild symptoms on exertion, most likely secondary to conditioning Discussed first treatment option available including echocardiogram and cardiac CTA She would like to hold off for now.  This is likely okay as  symptoms are mild, we recommend she call us if symptoms get worse  Hyperlipidemia Cholesterol is at goal on the current lipid regimen. No changes to the medications were made.   Total encounter time more than 50 minutes  Greater than 50% was spent in counseling and coordination of care with the patient   No orders of the defined types were placed in this encounter.    Signed, Esmond Plants, M.D., Ph.D. 07/13/2021  [place], [place]

## 2021-07-13 ENCOUNTER — Encounter: Payer: Self-pay | Admitting: Cardiovascular Disease

## 2021-07-13 ENCOUNTER — Ambulatory Visit: Payer: Medicare PPO | Admitting: Cardiovascular Disease

## 2021-07-13 VITALS — BP 110/70 | HR 87 | Ht 65.5 in | Wt 250.0 lb

## 2021-07-13 DIAGNOSIS — E1169 Type 2 diabetes mellitus with other specified complication: Secondary | ICD-10-CM | POA: Diagnosis not present

## 2021-07-13 DIAGNOSIS — R9431 Abnormal electrocardiogram [ECG] [EKG]: Secondary | ICD-10-CM

## 2021-07-13 DIAGNOSIS — N1832 Chronic kidney disease, stage 3b: Secondary | ICD-10-CM | POA: Diagnosis not present

## 2021-07-13 DIAGNOSIS — I700 Atherosclerosis of aorta: Secondary | ICD-10-CM | POA: Diagnosis not present

## 2021-07-13 DIAGNOSIS — E785 Hyperlipidemia, unspecified: Secondary | ICD-10-CM | POA: Diagnosis not present

## 2021-07-13 DIAGNOSIS — I10 Essential (primary) hypertension: Secondary | ICD-10-CM

## 2021-07-13 NOTE — Patient Instructions (Signed)
Call if symptoms of shortness of breath/chest pain get worse   Medication Instructions:  No changes  If you need a refill on your cardiac medications before your next appointment, please call your pharmacy.   Lab work: No new labs needed  Testing/Procedures: No new testing needed  Follow-Up: At [facility], you and your health needs are our priority.  As part of our continuing mission to provide you with exceptional heart care, we have created designated Provider Care Teams.  These Care Teams include your primary Cardiologist (physician) and Advanced Practice Providers (APPs -  Physician Assistants and Nurse Practitioners) who all work together to provide you with the care you need, when you need it.  You will need a follow up appointment as needed  Providers on your designated Care Team:   Murray Hodgkins, NP Christell Faith, PA-C Cadence Kathlen Mody, [place]  COVID-19 Vaccine Information can be found at: [email or web address].uk For questions related to vaccine distribution or appointments, please email [email or web address] or call [phone].

## 2021-07-19 ENCOUNTER — Telehealth: Payer: Self-pay | Admitting: Family Medicine

## 2021-07-19 ENCOUNTER — Ambulatory Visit: Payer: Self-pay | Admitting: Professional Counselor

## 2021-07-19 DIAGNOSIS — I10 Essential (primary) hypertension: Secondary | ICD-10-CM

## 2021-07-19 DIAGNOSIS — E1122 Type 2 diabetes mellitus with diabetic chronic kidney disease: Secondary | ICD-10-CM

## 2021-07-19 DIAGNOSIS — E1169 Type 2 diabetes mellitus with other specified complication: Secondary | ICD-10-CM

## 2021-07-20 ENCOUNTER — Other Ambulatory Visit: Payer: Self-pay | Admitting: Family Medicine

## 2021-07-20 DIAGNOSIS — Z1231 Encounter for screening mammogram for malignant neoplasm of breast: Secondary | ICD-10-CM

## 2021-07-25 DIAGNOSIS — E1122 Type 2 diabetes mellitus with diabetic chronic kidney disease: Secondary | ICD-10-CM | POA: Diagnosis not present

## 2021-07-25 DIAGNOSIS — R809 Proteinuria, unspecified: Secondary | ICD-10-CM | POA: Diagnosis not present

## 2021-07-25 DIAGNOSIS — N1832 Chronic kidney disease, stage 3b: Secondary | ICD-10-CM | POA: Diagnosis not present

## 2021-07-25 DIAGNOSIS — I10 Essential (primary) hypertension: Secondary | ICD-10-CM | POA: Diagnosis not present

## 2021-07-28 ENCOUNTER — Other Ambulatory Visit (INDEPENDENT_AMBULATORY_CARE_PROVIDER_SITE_OTHER): Payer: Medicare PPO

## 2021-07-28 DIAGNOSIS — E1169 Type 2 diabetes mellitus with other specified complication: Secondary | ICD-10-CM | POA: Diagnosis not present

## 2021-07-28 DIAGNOSIS — I10 Essential (primary) hypertension: Secondary | ICD-10-CM

## 2021-07-28 DIAGNOSIS — E785 Hyperlipidemia, unspecified: Secondary | ICD-10-CM | POA: Diagnosis not present

## 2021-07-28 DIAGNOSIS — N184 Chronic kidney disease, stage 4 (severe): Secondary | ICD-10-CM | POA: Diagnosis not present

## 2021-07-28 DIAGNOSIS — E1122 Type 2 diabetes mellitus with diabetic chronic kidney disease: Secondary | ICD-10-CM | POA: Diagnosis not present

## 2021-07-28 LAB — COMPREHENSIVE METABOLIC PANEL
ALT: 18 U/L (ref 0–35)
AST: 26 U/L (ref 0–37)
Albumin: 3.8 g/dL (ref 3.5–5.2)
Alkaline Phosphatase: 73 U/L (ref 39–117)
BUN: 16 mg/dL (ref 6–23)
CO2: 21 mEq/L (ref 19–32)
Calcium: 9.5 mg/dL (ref 8.4–10.5)
Chloride: 106 mEq/L (ref 96–112)
Creatinine, Ser: 1.47 mg/dL — ABNORMAL HIGH (ref 0.40–1.20)
GFR: 36.81 mL/min — ABNORMAL LOW (ref >60.00)
Glucose, Bld: 184 mg/dL — ABNORMAL HIGH (ref 70–99)
Potassium: 4.4 mEq/L (ref 3.5–5.1)
Sodium: 134 mEq/L — ABNORMAL LOW (ref 135–145)
Total Bilirubin: 0.7 mg/dL (ref 0.2–1.2)
Total Protein: 6.3 g/dL (ref 6.0–8.3)

## 2021-07-28 LAB — LIPID PANEL
Cholesterol: 131 mg/dL (ref 0–200)
HDL: 53.3 mg/dL (ref >39.00)
LDL Cholesterol: 40 mg/dL (ref 0–99)
NonHDL: 77.6
Total CHOL/HDL Ratio: 2
Triglycerides: 190 mg/dL — ABNORMAL HIGH (ref 0.0–149.0)
VLDL: 38 mg/dL (ref 0.0–40.0)

## 2021-07-28 LAB — HEMOGLOBIN A1C: Hgb A1c MFr Bld: 7 % — ABNORMAL HIGH (ref 4.6–6.5)

## 2021-07-28 LAB — TSH: TSH: 2.74 u[IU]/mL (ref 0.35–5.50)

## 2021-08-01 ENCOUNTER — Encounter: Payer: Self-pay | Admitting: Family Medicine

## 2021-08-01 ENCOUNTER — Ambulatory Visit: Payer: Medicare PPO | Admitting: Family Medicine

## 2021-08-01 VITALS — BP 110/64 | HR 60 | Temp 97.7°F | Ht 65.5 in | Wt 253.0 lb

## 2021-08-01 DIAGNOSIS — N184 Chronic kidney disease, stage 4 (severe): Secondary | ICD-10-CM

## 2021-08-01 DIAGNOSIS — E1169 Type 2 diabetes mellitus with other specified complication: Secondary | ICD-10-CM | POA: Diagnosis not present

## 2021-08-01 DIAGNOSIS — I10 Essential (primary) hypertension: Secondary | ICD-10-CM | POA: Diagnosis not present

## 2021-08-01 DIAGNOSIS — R778 Other specified abnormalities of plasma proteins: Secondary | ICD-10-CM

## 2021-08-01 DIAGNOSIS — E1122 Type 2 diabetes mellitus with diabetic chronic kidney disease: Secondary | ICD-10-CM

## 2021-08-01 DIAGNOSIS — N1832 Chronic kidney disease, stage 3b: Secondary | ICD-10-CM

## 2021-08-01 DIAGNOSIS — E669 Obesity, unspecified: Secondary | ICD-10-CM

## 2021-08-01 DIAGNOSIS — E785 Hyperlipidemia, unspecified: Secondary | ICD-10-CM

## 2021-08-01 MED ORDER — SEMAGLUTIDE (1 MG/DOSE) 2 MG/1.5ML ~~LOC~~ SOPN: 1.0000 mg | PEN_INJECTOR | SUBCUTANEOUS | 3 refills | Status: DC

## 2021-08-01 NOTE — Patient Instructions (Addendum)
Go up on your generic ozempic to 1 mg weekly  If side effects -let us know   Go ahead and stop the evening glipizide  Be mindful about eating  Continue weight watchers   Continue to watch the blood sugar   Let Dr Holley Raring know that your a1c is below 7   Follow up in 3 months

## 2021-08-01 NOTE — Progress Notes (Unsigned)
Subjective:    Patient ID: Jamie [name], female    DOB: [birth year], 66 y.o.   MRN: [id number]  HPI Pt presents for f/u of DM2 and chronic medical problems  Wt Readings from Last 3 Encounters:  08/01/21 253 lb (114.8 kg)  07/13/21 250 lb (113.4 kg)  05/31/21 245 lb (111.1 kg)   41.46 kg/m  Doing well  Babysat grand kids last week /enjoyed that  And dogs   Lab Results  Component Value Date   HGBA1C 7.0 (H) 07/28/2021  This is down from 7.6    Glipizide 10 mg in am and 5 mg in pm  Ozempic - up to 0.5 at this point  Tolerating it fine overall  There are times that stomach bothers her briefly / not bad   Not a big change in appetite (but she eats if stomach bothers her)    Diet : working on it  Re started [employer] /on the diabetic track  Was supposed to see diabetic nutrition therapy  It was put out many months and she was re scheduled and decided not to go  She had one episode of low glucose to 64  Had symptoms /felt hot   Glucose readings Avg 125 for 30 days  Early am fasting 80s-120s  Later in the day  - highest 183,  usually 160 to [place] - was px by Dr Holley Raring but she did not start it yet, she is worried about side effects  She does not know if she can take in more water and is worried about dehydration and utis   Has 2 week sample to try - will plan to do that     HTN bp is stable today  No cp or palpitations or headaches or edema  No side effects to medicines  BP Readings from Last 3 Encounters:  08/01/21 110/64  07/13/21 110/70  05/30/21 130/62      Losartan 25 mg daily  Metoprolol 25 mg daily   Lab Results  Component Value Date   CREATININE 1.47 (H) 07/28/2021   BUN 16 07/28/2021   NA 134 (L) 07/28/2021   K 4.4 07/28/2021   CL 106 07/28/2021   CO2 21 07/28/2021   GFR of 36.8 Sees Dr Holley Raring for CKD Has had kid stone removal Started pt on Farxiga  (aware did not tol jardiance in past but poss due to kidney  stone)  He sent her to hematologist for elevated wbc Ruled out multiple myeloma  Dx with MGUS  Told she has inflammation      Hyperlipidemia Lab Results  Component Value Date   CHOL 131 07/28/2021   CHOL 82 12/13/2020   CHOL 132 05/27/2020   Lab Results  Component Value Date   HDL 53.30 07/28/2021   HDL 30.90 (L) 12/13/2020   HDL 52.30 05/27/2020   Lab Results  Component Value Date   LDLCALC 40 07/28/2021   [place] 15 12/13/2020   LDLCALC 45 05/27/2020   Lab Results  Component Value Date   TRIG 190.0 (H) 07/28/2021   TRIG 178.0 (H) 12/13/2020   TRIG 174.0 (H) 05/27/2020   Lab Results  Component Value Date   CHOLHDL 2 07/28/2021   CHOLHDL 3 12/13/2020   CHOLHDL 3 05/27/2020   Lab Results  Component Value Date   LDLDIRECT 62.0 11/06/2019   LDLDIRECT 64.0 05/08/2019   LDLDIRECT 62.0 01/30/2018   Well controlled LDL at 40  Takes atorvastatin 5 mg  every other day   Had a good visit with cardiology  Thought her symptoms were atypical  Patient Active Problem List   Diagnosis Date Noted   Abnormal SPEP 04/28/2021   Abnormal EKG 04/26/2021   Ileostomy dysfunction ([place]) 08/31/2020   CKD (chronic kidney disease) stage 3, GFR 30-59 ml/min (HCC) 05/31/2020   Aortic atherosclerosis ([place]) 03/01/2020   Urinary frequency 12/23/2019   Elevated serum creatinine 05/14/2019   Dermatitis 07/01/2018   Left shoulder pain 11/30/2017   Palpitations 07/05/2017   Ostomy nurse consultation 16/10/9602   Lichen planus 54/09/8117   Hyperlipidemia associated with type 2 diabetes mellitus ([place]) 04/10/2016   Obesity (BMI 30-39.9) 01/08/2015   Red blood cell antibody positive 07/02/2013   Elevated C-reactive protein (CRP) 05/15/2012   Routine general medical examination at a health care facility 09/24/2011   Apnea 11/14/2010   Nevus of lower leg 11/14/2010   Other screening mammogram 10/31/2010   Stress reaction, emotional 05/02/2010   Compulsive overeater 05/02/2010    Arthritis    H/O small bowel obstruction    KNEE PAIN 10/14/2009   HOARSENESS 07/21/2009   DM (diabetes mellitus), type 2 with renal complications ([place]) 14/78/2956   ROSACEA 09/12/2007   Essential hypertension 05/15/2007   OSA (obstructive sleep apnea) 12/13/2006   Ulcerative colitis ([place]) 07/12/2006   Past Medical History:  Diagnosis Date   Anemia    Arthritis    generalized   Bowel obstruction (H[facility])    repeated (? possible adhesions) neg EGD   Chronic kidney disease    Colitis    with colectomy   Diabetes mellitus without complication ([place])    Dyspnea    covid   Gall stones 01/23/2006   Gastroenteritis 07/25/2014   hosp for IVF    GERD (gastroesophageal reflux disease)    History of kidney stones    HTN (hypertension)    Hyperglycemia    mild-monitors A1C   Obesity    Pneumonia    Rosacea    Sleep apnea    CPAP everynight   Past Surgical History:  Procedure Laterality Date   [place]   has ileostomy   COLON SURGERY  2021   removed rectum   COLONOSCOPY  08/1991   COLOSTOMY REVISION  12/06/2011   Procedure: COLOSTOMY REVISION;  Surgeon: Leighton Ruff, MD;  Location: [facility];  Service: General;  Laterality: Right;  OSTOMY revision   CYSTOSCOPY/URETEROSCOPY/HOLMIUM LASER/STENT PLACEMENT Left 04/11/2021   Procedure: CYSTOSCOPY/URETEROSCOPY/HOLMIUM LASER/STENT PLACEMENT;  Surgeon: Hollice Espy, MD;  Location: [facility];  Service: Urology;  Laterality: Left;   EXAMINATION UNDER ANESTHESIA  12/06/2011   Procedure: EXAM UNDER ANESTHESIA;  Surgeon: Leighton Ruff, MD;  Location: [facility];  Service: General;  Laterality: N/A;  rectal exam under anesthesia, anal dilation, pouchoscopy     TOTAL ABDOMINAL HYSTERECTOMY  05/2006   for abscessed ovaries   Social History   Tobacco Use   Smoking status: Never   Smokeless tobacco: Never  Vaping Use   Vaping Use: Never used  Substance Use Topics   Alcohol use:  No    Alcohol/week: 0.0 standard drinks of alcohol   Drug use: No   Family History  Problem Relation Age of Onset   Hypertension Mother    Hyperlipidemia Mother    Cirrhosis Mother        NASH   Diabetes Mother    Liver cancer Father        resection  secondary to mets   Cancer Father        colon   Hyperlipidemia Father    Hypertension Father    Allergies Father    Ulcerative colitis Father    Allergies Brother    Gastric cancer Brother    Breast cancer Paternal Grandmother    Breast cancer Cousin 80       paternal cousins   Allergies  Allergen Reactions   Ciprofloxacin Rash    Erythema and pruritis around IV site, erythema and rash along vein   Other Rash   Jardiance [Empagliflozin]     Increase in Cr    Lisinopril     REACTION: felt bad/ stomach hurt/ weak and tired   Metformin And Related     GI   Sulfa Antibiotics Rash   Current Outpatient Medications on File Prior to Visit  Medication Sig Dispense Refill   ACCU-CHEK GUIDE test strip TO CHECK GLUCOSE DAILY AND AS NEEDED FOR TYPE 2 DIABETES 100 strip 0   acetaminophen (TYLENOL) 325 MG tablet Take 650 mg by mouth every 6 (six) hours as needed for moderate pain.     albuterol (VENTOLIN HFA) 108 (90 Base) MCG/ACT inhaler Inhale 2 puffs into the lungs every 4 (four) hours as needed for wheezing or shortness of breath. 8 g 2   atorvastatin (LIPITOR) 10 MG tablet TAKE 1/2 PILL BY MOUTH EVERY OTHER DAY 24 tablet 1   Blood Glucose Monitoring Suppl (ACCU-CHEK GUIDE) w/Device KIT To check glucose daily and as needed for type 2 diabetes 1 kit 0   Cholecalciferol (VITAMIN D3) 25 MCG (1000 UT) CAPS Take 1,000 Units by mouth daily.     esomeprazole (NEXIUM) 20 MG capsule Take 20 mg by mouth every other day.     fluticasone (FLONASE) 50 MCG/ACT nasal spray Place 2 sprays into both nostrils at bedtime.     glipiZIDE (GLUCOTROL) 5 MG tablet TAKE 1 TABLET (5 MG TOTAL) BY MOUTH 2 (TWO) TIMES DAILY BEFORE A MEAL. (Patient taking  differently: Take 10 mg by mouth daily before breakfast.) 180 tablet 0   hyoscyamine (LEVSIN SL) 0.125 MG SL tablet Take 1 tablet (0.125 mg total) by mouth every 4 (four) hours as needed. For abdominal cramps 90 tablet 3   Lancets (ACCU-CHEK MULTICLIX) lancets To check glucose daily and as needed for diabetes type 2 100 each 3   losartan (COZAAR) 25 MG tablet Take 25 mg by mouth daily.     melatonin 1 MG TABS tablet Take 1.5 mg by mouth at bedtime.     metoprolol tartrate (LOPRESSOR) 25 MG tablet TAKE 1 TABLET BY MOUTH EVERY DAY 90 tablet 1   Multiple Vitamin (MULTIVITAMIN) tablet Take 1 tablet by mouth daily.     NON FORMULARY SLEEPS WITH BI-PAP     zolpidem (AMBIEN) 10 MG tablet Take 0.5-1 tablets (5-10 mg total) by mouth at bedtime as needed. for sleep 30 tablet 1   No current facility-administered medications on file prior to visit.    Review of Systems  Constitutional:  Positive for fatigue. Negative for activity change, appetite change, fever and unexpected weight change.  HENT:  Negative for congestion, ear pain, rhinorrhea, sinus pressure and sore throat.   Eyes:  Negative for pain, redness and visual disturbance.  Respiratory:  Negative for cough, shortness of breath and wheezing.   Cardiovascular:  Negative for chest pain and palpitations.  Gastrointestinal:  Negative for abdominal pain, blood in stool, constipation and diarrhea.  Endocrine: Negative  for polydipsia and polyuria.  Genitourinary:  Negative for dysuria, frequency and urgency.  Musculoskeletal:  Positive for arthralgias. Negative for back pain and myalgias.  Skin:  Negative for pallor and rash.  Allergic/Immunologic: Negative for environmental allergies.  Neurological:  Negative for dizziness, syncope and headaches.  Hematological:  Negative for adenopathy. Does not bruise/bleed easily.  Psychiatric/Behavioral:  Negative for decreased concentration and dysphoric mood. The patient is not nervous/anxious.         Objective:   Physical Exam Constitutional:      General: She is not in acute distress.    Appearance: Normal appearance. She is well-developed. She is obese. She is not ill-appearing or diaphoretic.  HENT:     Head: Normocephalic and atraumatic.  Eyes:     Conjunctiva/sclera: Conjunctivae normal.     Pupils: Pupils are equal, round, and reactive to light.  Neck:     Thyroid: No thyromegaly.     Vascular: No carotid bruit or JVD.  Cardiovascular:     Rate and Rhythm: Normal rate and regular rhythm.     Heart sounds: Normal heart sounds.     No gallop.  Pulmonary:     Effort: Pulmonary effort is normal. No respiratory distress.     Breath sounds: Normal breath sounds. No wheezing or rales.  Abdominal:     General: There is no distension or abdominal bruit.     Palpations: Abdomen is soft.     Comments: Ostomy noted   Musculoskeletal:     Cervical back: Normal range of motion and neck supple.     Right lower leg: No edema.     Left lower leg: No edema.  Lymphadenopathy:     Cervical: No cervical adenopathy.  Skin:    General: Skin is warm and dry.     Coloration: Skin is not pale.     Findings: No rash.  Neurological:     Mental Status: She is alert.     Coordination: Coordination normal.     Deep Tendon Reflexes: Reflexes are normal and symmetric. Reflexes normal.  Psychiatric:        Mood and Affect: Mood normal.           Assessment & Plan:   Problem List Items Addressed This Visit       Cardiovascular and Mediastinum   Essential hypertension    bp in fair control at this time  BP Readings from Last 1 Encounters:  08/01/21 110/64  No changes needed Most recent labs reviewed  Disc lifstyle change with low sodium diet and exercise  Plan to continue  Losartan 25 mg daily  Metoprolol 25 mg daily         Endocrine   DM (diabetes mellitus), type 2 with renal complications ([place]) - Primary    Lab Results  Component Value Date   HGBA1C 7.0 (H) 07/28/2021   Plan to inc semaglutide to 1 mg weekly  Hold the pm glipizide due to risk of low glucose F/u 3 mo  Nephrologist is considering farxiga -she is cocerned about side eff        Relevant Medications   Semaglutide, 1 MG/DOSE, 2 MG/1.5ML SOPN   Hyperlipidemia associated with type 2 diabetes mellitus ([place])    Disc goals for lipids and reasons to control them Rev last labs with pt Rev low sat fat diet in detail  Well controlled with atorvastatin 5 mg every other day        Relevant Medications  Semaglutide, 1 MG/DOSE, 2 MG/1.5ML SOPN     Genitourinary   CKD (chronic kidney disease) stage 3, GFR 30-59 ml/min (HCC)    Continues f/u with nephrology  Considering farxiga  Good fluid intake        Other   Abnormal SPEP    Seen by hematology for low wbc  Following  Ruled out mult myeloma       Obesity (BMI 30-39.9)    Discussed how this problem influences overall health and the risks it imposes  Reviewed plan for weight loss with lower calorie diet (via better food choices and also portion control or program like weight watchers) and exercise building up to or more than 30 minutes 5 days per week including some aerobic activity   Not loosing wt with the semaglutide so far  Will inc dose to 1 mg weekly  Disc option of GLP medication including possible side effects like GI intolerance and risk of thyroid and endocrine cancer, pancreatitis and gallstones, kidney problems and diabetic retinopathy       Relevant Medications   Semaglutide, 1 MG/DOSE, 2 MG/1.5ML SOPN

## 2021-08-02 NOTE — Assessment & Plan Note (Signed)
Lab Results  Component Value Date   HGBA1C 7.0 (H) 07/28/2021   Plan to inc semaglutide to 1 mg weekly  Hold the pm glipizide due to risk of low glucose F/u 3 mo  Nephrologist is considering farxiga -she is cocerned about side eff

## 2021-08-02 NOTE — Assessment & Plan Note (Signed)
Discussed how this problem influences overall health and the risks it imposes  Reviewed plan for weight loss with lower calorie diet (via better food choices and also portion control or program like weight watchers) and exercise building up to or more than 30 minutes 5 days per week including some aerobic activity   Not loosing wt with the semaglutide so far  Will inc dose to 1 mg weekly  Disc option of GLP medication including possible side effects like GI intolerance and risk of thyroid and endocrine cancer, pancreatitis and gallstones, kidney problems and diabetic retinopathy

## 2021-08-02 NOTE — Assessment & Plan Note (Signed)
Seen by hematology for low wbc  Following  Ruled out mult myeloma

## 2021-08-02 NOTE — Assessment & Plan Note (Signed)
bp in fair control at this time  BP Readings from Last 1 Encounters:  08/01/21 110/64   No changes needed Most recent labs reviewed  Disc lifstyle change with low sodium diet and exercise  Plan to continue  Losartan 25 mg daily  Metoprolol 25 mg daily

## 2021-08-02 NOTE — Assessment & Plan Note (Signed)
Continues f/u with nephrology  Considering farxiga  Good fluid intake

## 2021-08-02 NOTE — Assessment & Plan Note (Signed)
Disc goals for lipids and reasons to control them Rev last labs with pt Rev low sat fat diet in detail  Well controlled with atorvastatin 5 mg every other day

## 2021-08-15 ENCOUNTER — Ambulatory Visit
Admission: RE | Admit: 2021-08-15 | Discharge: 2021-08-15 | Disposition: A | Discharge status: Home / Self Care | Payer: Medicare PPO | Source: Ambulatory Visit | Attending: Family Medicine | Admitting: Family Medicine

## 2021-08-15 DIAGNOSIS — Z1231 Encounter for screening mammogram for malignant neoplasm of breast: Secondary | ICD-10-CM | POA: Insufficient documentation

## 2021-09-21 ENCOUNTER — Other Ambulatory Visit: Payer: Self-pay | Admitting: Family Medicine

## 2021-10-07 ENCOUNTER — Telehealth: Payer: Self-pay | Admitting: Family Medicine

## 2021-10-07 MED ORDER — SEMAGLUTIDE (1 MG/DOSE) 2 MG/1.5ML ~~LOC~~ SOPN: 1.0000 mg | PEN_INJECTOR | SUBCUTANEOUS | 2 refills | Status: DC

## 2021-10-07 NOTE — Telephone Encounter (Signed)
Pt called stated [facility] is unable to fill RX Ozempic is out of stock . Would like a neew RX be sent to [facility] [place]    Please advise [phone]

## 2021-10-07 NOTE — Telephone Encounter (Signed)
Rx sent 

## 2021-10-10 ENCOUNTER — Telehealth: Payer: Self-pay

## 2021-10-10 NOTE — Telephone Encounter (Signed)
Prior auth started for Ozempic (1 MG/DOSE) 4MG/3ML pen-injectors. Jamie [name] (Key: [facility]) Per Cover My Meds, available without authorization.   I called [facility] to verify that patient can get Rx without PA.  They stated that patient has already picked up prescription.

## 2021-10-12 ENCOUNTER — Other Ambulatory Visit: Payer: Self-pay | Admitting: Family Medicine

## 2021-10-24 ENCOUNTER — Encounter: Payer: Self-pay | Admitting: Family Medicine

## 2021-11-01 ENCOUNTER — Ambulatory Visit: Payer: Medicare PPO | Admitting: Family Medicine

## 2021-11-01 ENCOUNTER — Encounter: Payer: Self-pay | Admitting: Family Medicine

## 2021-11-01 VITALS — BP 126/64 | HR 80 | Temp 97.4°F | Ht 65.5 in | Wt 251.5 lb

## 2021-11-01 DIAGNOSIS — E1122 Type 2 diabetes mellitus with diabetic chronic kidney disease: Secondary | ICD-10-CM

## 2021-11-01 DIAGNOSIS — N184 Chronic kidney disease, stage 4 (severe): Secondary | ICD-10-CM | POA: Diagnosis not present

## 2021-11-01 DIAGNOSIS — N1832 Chronic kidney disease, stage 3b: Secondary | ICD-10-CM

## 2021-11-01 DIAGNOSIS — I10 Essential (primary) hypertension: Secondary | ICD-10-CM

## 2021-11-01 DIAGNOSIS — E669 Obesity, unspecified: Secondary | ICD-10-CM

## 2021-11-01 LAB — POCT GLYCOSYLATED HEMOGLOBIN (HGB A1C): Hemoglobin A1C: 6.5 % — AB (ref 4.0–5.6)

## 2021-11-01 MED ORDER — GLIPIZIDE 5 MG PO TABS: 5.0000 mg | ORAL_TABLET | Freq: Every day | ORAL | 0 refills | Status: DC

## 2021-11-01 NOTE — Assessment & Plan Note (Signed)
Discussed how this problem influences overall health and the risks it imposes  Reviewed plan for weight loss with lower calorie diet (via better food choices and also portion control or program like weight watchers) and exercise building up to or more than 30 minutes 5 days per week including some aerobic activity   Commended on diet work so far  Disc plan for exercise to inc as tolerated  Would benefit from strength training as well  Noted some deconditioning- if this does not improve then may need further eval  She has pulm f/u in jan

## 2021-11-01 NOTE — Assessment & Plan Note (Signed)
Pt is good about fluid intake  For nephrology f/u next mo

## 2021-11-01 NOTE — Assessment & Plan Note (Addendum)
Improved Lab Results  Component Value Date   HGBA1C 6.5 (A) 11/01/2021   Some low glucose readings at night  Will cut am glipizide dose to 5 mg daily from 10 and continue to monitor  Continue semaglutide 1 mg weekly , do not think she would tolerate a larger dose Doing great with diet/low glycemic and point system Disc exercise today and plan to get stamina up and build muscle (adv as tolerated) Eye exam due next month  Taking statin and arb Plan f/u 3 mo or earlier if needed

## 2021-11-01 NOTE — Assessment & Plan Note (Signed)
bp in fair control at this time  BP Readings from Last 1 Encounters:  11/01/21 126/64   No changes needed Most recent labs reviewed  Disc lifstyle change with low sodium diet and exercise  Plan to continue  Losartan 25 mg daily  Metoprolol 25 mg daily  Nephrology f/u planned next mo

## 2021-11-01 NOTE — Progress Notes (Signed)
Subjective:    Patient ID: Jamie [name], female    DOB: [birth year], 66 y.o.   MRN: [id number]  HPI Pt presents for f/u of DM2 and CKD and HTN  Wt Readings from Last 3 Encounters:  08/01/21 253 lb (114.8 kg)  07/13/21 250 lb (113.4 kg)  05/31/21 245 lb (111.1 kg)   41.46 kg/m  No changes  Got her flu shot and covid shot last week - felt a little bad the next day and then was fine   Still taking ozempic weekly and it is harder to find  Cannot tell if it helps appetite At times it makes her feel bad, more gassy and that is troublesome with her colostomy     HTN bp is stable today  No cp or palpitations or headaches or edema  No side effects to medicines  BP Readings from Last 3 Encounters:  08/01/21 110/64  07/13/21 110/70  05/30/21 130/62    Pulse Readings from Last 3 Encounters:  08/01/21 60  07/13/21 87  05/30/21 87    Losartan 25 mg daily  Metoprolol 25 mg daily    DM2 Last a1c was improved at 7.0 Went up on semaglutide to 1 mg weekly  Glipizide 10 mg daily in am  Cannot take metformin due to CKD  Today- better! At 6.5  Her 30 d average was 110   Had one episode of low glucose at 58   Working on diet  Eating well  Tries not to snack after dinner   Lab Results  Component Value Date   MICROALBUR 3.8 (H) 12/13/2020   MICROALBUR 10.5 (H) 05/08/2019       Very deconditioned  Gets sob after 5-10 min of exercise  No cp  However she can use the elliptical and go for much longer  Does not think anything is wrong   Sees pulm in jan for OSA  Has used ventolin in the past    Seeing nephrologist for CKD Lab Results  Component Value Date   CREATININE 1.47 (H) 07/28/2021   BUN 16 07/28/2021   NA 134 (L) 07/28/2021   K 4.4 07/28/2021   CL 106 07/28/2021   CO2 21 07/28/2021    Sees Dr Holley Raring next month  Had discussed Farxiga , she decided not too   Lab Results  Component Value Date   CHOL 131 07/28/2021   HDL 53.30 07/28/2021    LDLCALC 40 07/28/2021   LDLDIRECT 62.0 11/06/2019   TRIG 190.0 (H) 07/28/2021   CHOLHDL 2 07/28/2021    Takes atorvastatin 5 mg every other day  Patient Active Problem List   Diagnosis Date Noted   Abnormal SPEP 04/28/2021   Abnormal EKG 04/26/2021   Ileostomy dysfunction ([place]) 08/31/2020   CKD (chronic kidney disease) stage 3, GFR 30-59 ml/min ([place]) 05/31/2020   Aortic atherosclerosis ([place]) 03/01/2020   Urinary frequency 12/23/2019   Elevated serum creatinine 05/14/2019   Dermatitis 07/01/2018   Left shoulder pain 11/30/2017   Palpitations 07/05/2017   Ostomy nurse consultation 56/31/4970   Lichen planus 26/37/8588   Hyperlipidemia associated with type 2 diabetes mellitus ([place]) 04/10/2016   Obesity (BMI 30-39.9) 01/08/2015   Red blood cell antibody positive 07/02/2013   Elevated C-reactive protein (CRP) 05/15/2012   Routine general medical examination at a health care facility 09/24/2011   Apnea 11/14/2010   Nevus of lower leg 11/14/2010   Other screening mammogram 10/31/2010   Stress reaction, emotional 05/02/2010   Compulsive overeater 05/02/2010  Arthritis    H/O small bowel obstruction    KNEE PAIN 10/14/2009   HOARSENESS 07/21/2009   DM (diabetes mellitus), type 2 with renal complications ([place]) 00/92/3300   ROSACEA 09/12/2007   Essential hypertension 05/15/2007   OSA (obstructive sleep apnea) 12/13/2006   Ulcerative colitis ([place]) 07/12/2006   Past Medical History:  Diagnosis Date   Anemia    Arthritis    generalized   Bowel obstruction ([facility])    repeated (? possible adhesions) neg EGD   Chronic kidney disease    Colitis    with colectomy   Diabetes mellitus without complication ([place])    Dyspnea    covid   Gall stones 01/23/2006   Gastroenteritis 07/25/2014   hosp for IVF    GERD (gastroesophageal reflux disease)    History of kidney stones    HTN (hypertension)    Hyperglycemia    mild-monitors A1C   Obesity    Pneumonia    Rosacea    Sleep  apnea    CPAP everynight   Past Surgical History:  Procedure Laterality Date   [place]   has ileostomy   COLON SURGERY  2021   removed rectum   COLONOSCOPY  08/1991   COLOSTOMY REVISION  12/06/2011   Procedure: COLOSTOMY REVISION;  Surgeon: Leighton Ruff, MD;  Location: [facility];  Service: General;  Laterality: Right;  OSTOMY revision   CYSTOSCOPY/URETEROSCOPY/HOLMIUM LASER/STENT PLACEMENT Left 04/11/2021   Procedure: CYSTOSCOPY/URETEROSCOPY/HOLMIUM LASER/STENT PLACEMENT;  Surgeon: Hollice Espy, MD;  Location: [facility];  Service: Urology;  Laterality: Left;   EXAMINATION UNDER ANESTHESIA  12/06/2011   Procedure: EXAM UNDER ANESTHESIA;  Surgeon: Leighton Ruff, MD;  Location: [facility];  Service: General;  Laterality: N/A;  rectal exam under anesthesia, anal dilation, pouchoscopy     TOTAL ABDOMINAL HYSTERECTOMY  05/2006   for abscessed ovaries   Social History   Tobacco Use   Smoking status: Never   Smokeless tobacco: Never  Vaping Use   Vaping Use: Never used  Substance Use Topics   Alcohol use: No    Alcohol/week: 0.0 standard drinks of alcohol   Drug use: No   Family History  Problem Relation Age of Onset   Hypertension Mother    Hyperlipidemia Mother    Cirrhosis Mother        NASH   Diabetes Mother    Liver cancer Father        resection secondary to mets   Cancer Father        colon   Hyperlipidemia Father    Hypertension Father    Allergies Father    Ulcerative colitis Father    Allergies Brother    Gastric cancer Brother    Breast cancer Paternal Grandmother    Breast cancer Cousin 22       paternal cousins   Allergies  Allergen Reactions   Ciprofloxacin Rash    Erythema and pruritis around IV site, erythema and rash along vein   Other Rash   Jardiance [Empagliflozin]     Increase in Cr    Lisinopril     REACTION: felt bad/ stomach hurt/ weak and tired   Metformin And Related     GI    Sulfa Antibiotics Rash   Current Outpatient Medications on File Prior to Visit  Medication Sig Dispense Refill   ACCU-CHEK GUIDE test strip TO CHECK GLUCOSE DAILY AND AS NEEDED FOR TYPE 2 DIABETES 100 strip  0   acetaminophen (TYLENOL) 325 MG tablet Take 650 mg by mouth every 6 (six) hours as needed for moderate pain.     albuterol (VENTOLIN HFA) 108 (90 Base) MCG/ACT inhaler Inhale 2 puffs into the lungs every 4 (four) hours as needed for wheezing or shortness of breath. 8 g 2   atorvastatin (LIPITOR) 10 MG tablet TAKE 1/2 PILL BY MOUTH EVERY OTHER DAY 24 tablet 1   Blood Glucose Monitoring Suppl (ACCU-CHEK GUIDE) w/Device KIT To check glucose daily and as needed for type 2 diabetes 1 kit 0   Cholecalciferol (VITAMIN D3) 25 MCG (1000 UT) CAPS Take 1,000 Units by mouth daily.     esomeprazole (NEXIUM) 20 MG capsule Take 20 mg by mouth every other day.     fluticasone (FLONASE) 50 MCG/ACT nasal spray Place 2 sprays into both nostrils at bedtime.     hyoscyamine (LEVSIN SL) 0.125 MG SL tablet Take 1 tablet (0.125 mg total) by mouth every 4 (four) hours as needed. For abdominal cramps 90 tablet 3   Lancets (ACCU-CHEK MULTICLIX) lancets To check glucose daily and as needed for diabetes type 2 100 each 3   losartan (COZAAR) 25 MG tablet Take 25 mg by mouth daily.     melatonin 1 MG TABS tablet Take 1.5 mg by mouth at bedtime.     metoprolol tartrate (LOPRESSOR) 25 MG tablet TAKE 1 TABLET BY MOUTH EVERY DAY 90 tablet 1   Multiple Vitamin (MULTIVITAMIN) tablet Take 1 tablet by mouth daily.     NON FORMULARY SLEEPS WITH BI-PAP     Semaglutide, 1 MG/DOSE, 2 MG/1.5ML SOPN Inject 1 mg into the skin once a week. 2 mL 2   zolpidem (AMBIEN) 10 MG tablet Take 0.5-1 tablets (5-10 mg total) by mouth at bedtime as needed. for sleep 30 tablet 1   No current facility-administered medications on file prior to visit.    Review of Systems  Constitutional:  Positive for fatigue. Negative for activity change,  appetite change, fever and unexpected weight change.  HENT:  Negative for congestion, ear pain, rhinorrhea, sinus pressure and sore throat.   Eyes:  Negative for pain, redness and visual disturbance.  Respiratory:  Positive for shortness of breath. Negative for cough and wheezing.        Sob if she walks more than 5-10 minutes   Can go on elliptical for almost 30 min  Using cpap   No wheezing  No cough  No cp  Cardiovascular:  Negative for chest pain and palpitations.  Gastrointestinal:  Negative for abdominal pain, blood in stool, constipation and diarrhea.  Endocrine: Negative for polydipsia and polyuria.  Genitourinary:  Negative for dysuria, frequency and urgency.  Musculoskeletal:  Negative for arthralgias, back pain and myalgias.  Skin:  Negative for pallor and rash.  Allergic/Immunologic: Negative for environmental allergies.  Neurological:  Negative for dizziness, syncope and headaches.  Hematological:  Negative for adenopathy. Does not bruise/bleed easily.  Psychiatric/Behavioral:  Negative for decreased concentration and dysphoric mood. The patient is not nervous/anxious.        Objective:   Physical Exam Constitutional:      General: She is not in acute distress.    Appearance: Normal appearance. She is well-developed. She is obese. She is not ill-appearing or diaphoretic.  HENT:     Head: Normocephalic and atraumatic.     Mouth/Throat:     Mouth: Mucous membranes are moist.  Eyes:     General:  Right eye: No discharge.        Left eye: No discharge.     Conjunctiva/sclera: Conjunctivae normal.     Pupils: Pupils are equal, round, and reactive to light.  Neck:     Thyroid: No thyromegaly.     Vascular: No carotid bruit or JVD.  Cardiovascular:     Rate and Rhythm: Normal rate and regular rhythm.     Heart sounds: Normal heart sounds.     No gallop.  Pulmonary:     Effort: Pulmonary effort is normal. No respiratory distress.     Breath sounds: Normal  breath sounds. No wheezing or rales.  Abdominal:     General: There is no distension or abdominal bruit.     Palpations: Abdomen is soft. There is no mass.     Tenderness: There is no abdominal tenderness. There is no guarding.     Comments: Ostomy site looks normal  Musculoskeletal:     Cervical back: Normal range of motion and neck supple.     Right lower leg: No edema.     Left lower leg: No edema.  Lymphadenopathy:     Cervical: No cervical adenopathy.  Skin:    General: Skin is warm and dry.     Coloration: Skin is not jaundiced or pale.     Findings: No rash.  Neurological:     Mental Status: She is alert.     Coordination: Coordination normal.     Deep Tendon Reflexes: Reflexes are normal and symmetric. Reflexes normal.  Psychiatric:        Mood and Affect: Mood normal.           Assessment & Plan:   Problem List Items Addressed This Visit       Cardiovascular and Mediastinum   Essential hypertension    bp in fair control at this time  BP Readings from Last 1 Encounters:  11/01/21 126/64  No changes needed Most recent labs reviewed  Disc lifstyle change with low sodium diet and exercise  Plan to continue  Losartan 25 mg daily  Metoprolol 25 mg daily  Nephrology f/u planned next mo        Endocrine   DM (diabetes mellitus), type 2 with renal complications ([place]) - Primary    Improved Lab Results  Component Value Date   HGBA1C 6.5 (A) 11/01/2021   Some low glucose readings at night  Will cut am glipizide dose to 5 mg daily from 10 and continue to monitor  Continue semaglutide 1 mg weekly , do not think she would tolerate a larger dose Doing great with diet/low glycemic and point system Disc exercise today and plan to get stamina up and build muscle (adv as tolerated) Eye exam due next month  Taking statin and arb Plan f/u 3 mo or earlier if needed       Relevant Medications   glipiZIDE (GLUCOTROL) 5 MG tablet   Other Relevant Orders   POCT  glycosylated hemoglobin (Hb A1C) (Completed)     Genitourinary   CKD (chronic kidney disease) stage 3, GFR 30-59 ml/min (HCC)    Pt is good about fluid intake  For nephrology f/u next mo        Other   Obesity (BMI 30-39.9)    Discussed how this problem influences overall health and the risks it imposes  Reviewed plan for weight loss with lower calorie diet (via better food choices and also portion control or program like weight watchers)  and exercise building up to or more than 30 minutes 5 days per week including some aerobic activity   Commended on diet work so far  Disc plan for exercise to inc as tolerated  Would benefit from strength training as well  Noted some deconditioning- if this does not improve then may need further eval  She has pulm f/u in jan      Relevant Medications   glipiZIDE (GLUCOTROL) 5 MG tablet

## 2021-11-01 NOTE — Patient Instructions (Addendum)
Back off the glipizide to 5 mg (instead of 10) daily   If you continue to have low glucose readings let us know asap   Start walking 5 minutes 2-3 times daily  Very slowly increase that by a minute or two  Work on posture  Use the elliptical also  If your exercise tolerance does not improve let us know  Watch for chest pain or pressure also   Investigate a chair yoga program on line   Follow up with nephrology and pulmonology as planned  Keep working on the low glycemic diet

## 2021-11-08 ENCOUNTER — Other Ambulatory Visit: Payer: Self-pay | Admitting: Family Medicine

## 2021-11-17 ENCOUNTER — Other Ambulatory Visit: Payer: Self-pay | Admitting: Family Medicine

## 2021-11-23 DIAGNOSIS — E1122 Type 2 diabetes mellitus with diabetic chronic kidney disease: Secondary | ICD-10-CM | POA: Diagnosis not present

## 2021-11-23 DIAGNOSIS — N1832 Chronic kidney disease, stage 3b: Secondary | ICD-10-CM | POA: Diagnosis not present

## 2021-11-28 DIAGNOSIS — I10 Essential (primary) hypertension: Secondary | ICD-10-CM | POA: Diagnosis not present

## 2021-11-28 DIAGNOSIS — R809 Proteinuria, unspecified: Secondary | ICD-10-CM | POA: Diagnosis not present

## 2021-11-28 DIAGNOSIS — N1831 Chronic kidney disease, stage 3a: Secondary | ICD-10-CM | POA: Diagnosis not present

## 2021-11-28 DIAGNOSIS — E1122 Type 2 diabetes mellitus with diabetic chronic kidney disease: Secondary | ICD-10-CM | POA: Diagnosis not present

## 2021-11-29 ENCOUNTER — Other Ambulatory Visit: Payer: Self-pay | Admitting: Family Medicine

## 2021-11-29 NOTE — Telephone Encounter (Signed)
The 28m/1.5 mL was filled on 10/07/21 #270mwith 2 refills, please advise, f/u scheduled 02/06/22

## 2021-11-30 ENCOUNTER — Telehealth: Payer: Self-pay

## 2021-11-30 NOTE — Telephone Encounter (Signed)
Prior auth started for Ozempic (1 MG/DOSE) 4MG/3ML pen-injectors. Jamie [name] (Key: [id number]) Per Cover My Meds, Ozempic is available without authorization.

## 2021-12-07 ENCOUNTER — Other Ambulatory Visit (HOSPITAL_COMMUNITY): Payer: Self-pay

## 2021-12-12 ENCOUNTER — Encounter: Payer: Self-pay | Admitting: Family Medicine

## 2021-12-12 ENCOUNTER — Other Ambulatory Visit (HOSPITAL_COMMUNITY): Payer: Self-pay

## 2021-12-12 ENCOUNTER — Telehealth: Payer: Self-pay | Admitting: Family Medicine

## 2021-12-12 NOTE — Telephone Encounter (Signed)
Pt called asking could [facility] give a pre authorization for the meds, Semaglutide, 1 MG/DOSE, (OZEMPIC, 1 MG/DOSE,) 4 MG/3ML SOPN so her insurance will cover meds? Call back # [phone]

## 2021-12-12 NOTE — Telephone Encounter (Signed)
Pt notified of the PA dpt's comments

## 2021-12-13 ENCOUNTER — Other Ambulatory Visit (HOSPITAL_COMMUNITY): Payer: Self-pay

## 2021-12-13 ENCOUNTER — Other Ambulatory Visit: Payer: Self-pay

## 2021-12-13 MED ORDER — OZEMPIC (1 MG/DOSE) 4 MG/3ML ~~LOC~~ SOPN: PEN_INJECTOR | SUBCUTANEOUS | 3 refills | Status: DC

## 2021-12-23 DIAGNOSIS — H5213 Myopia, bilateral: Secondary | ICD-10-CM | POA: Diagnosis not present

## 2021-12-23 DIAGNOSIS — H2513 Age-related nuclear cataract, bilateral: Secondary | ICD-10-CM | POA: Diagnosis not present

## 2021-12-23 DIAGNOSIS — E119 Type 2 diabetes mellitus without complications: Secondary | ICD-10-CM | POA: Diagnosis not present

## 2021-12-23 DIAGNOSIS — H43813 Vitreous degeneration, bilateral: Secondary | ICD-10-CM | POA: Diagnosis not present

## 2021-12-23 DIAGNOSIS — H2640 Unspecified secondary cataract: Secondary | ICD-10-CM | POA: Diagnosis not present

## 2021-12-23 LAB — HM DIABETES EYE EXAM

## 2022-01-10 ENCOUNTER — Telehealth: Payer: Self-pay | Admitting: Internal Medicine

## 2022-01-10 NOTE — Telephone Encounter (Signed)
PT needs appt Mid Jan for a Cpap re certification. Prefers [employer]. I did offer one of our NP's. I saw nothing w/Dr. Mortimer Fries until Feb. Please call to advise if we can fit her in possibly? [phone]

## 2022-01-10 NOTE — Telephone Encounter (Signed)
I spoke with the patient and scheduled her an appt for 03/07/2021 with Dr. Mortimer Fries. Nothing further is needed.

## 2022-01-12 ENCOUNTER — Encounter: Payer: Self-pay | Admitting: Family Medicine

## 2022-01-31 ENCOUNTER — Other Ambulatory Visit (INDEPENDENT_AMBULATORY_CARE_PROVIDER_SITE_OTHER): Payer: Medicare PPO

## 2022-01-31 DIAGNOSIS — I10 Essential (primary) hypertension: Secondary | ICD-10-CM

## 2022-01-31 DIAGNOSIS — E1122 Type 2 diabetes mellitus with diabetic chronic kidney disease: Secondary | ICD-10-CM

## 2022-01-31 DIAGNOSIS — N184 Chronic kidney disease, stage 4 (severe): Secondary | ICD-10-CM

## 2022-01-31 DIAGNOSIS — N1832 Chronic kidney disease, stage 3b: Secondary | ICD-10-CM | POA: Diagnosis not present

## 2022-01-31 LAB — HEMOGLOBIN A1C: Hgb A1c MFr Bld: 6.4 % (ref 4.6–6.5)

## 2022-01-31 LAB — LIPID PANEL
Cholesterol: 129 mg/dL (ref 0–200)
HDL: 57.2 mg/dL (ref >39.00)
LDL Cholesterol: 35 mg/dL (ref 0–99)
NonHDL: 71.32
Total CHOL/HDL Ratio: 2
Triglycerides: 182 mg/dL — ABNORMAL HIGH (ref 0.0–149.0)
VLDL: 36.4 mg/dL (ref 0.0–40.0)

## 2022-01-31 LAB — COMPREHENSIVE METABOLIC PANEL
ALT: 16 U/L (ref 0–35)
AST: 28 U/L (ref 0–37)
Albumin: 3.9 g/dL (ref 3.5–5.2)
Alkaline Phosphatase: 59 U/L (ref 39–117)
BUN: 18 mg/dL (ref 6–23)
CO2: 26 mEq/L (ref 19–32)
Calcium: 9.4 mg/dL (ref 8.4–10.5)
Chloride: 103 mEq/L (ref 96–112)
Creatinine, Ser: 1.38 mg/dL — ABNORMAL HIGH (ref 0.40–1.20)
GFR: 39.57 mL/min — ABNORMAL LOW (ref >60.00)
Glucose, Bld: 144 mg/dL — ABNORMAL HIGH (ref 70–99)
Potassium: 4 mEq/L (ref 3.5–5.1)
Sodium: 138 mEq/L (ref 135–145)
Total Bilirubin: 0.6 mg/dL (ref 0.2–1.2)
Total Protein: 7.1 g/dL (ref 6.0–8.3)

## 2022-02-06 ENCOUNTER — Encounter: Payer: Self-pay | Admitting: Family Medicine

## 2022-02-06 ENCOUNTER — Ambulatory Visit: Payer: Medicare PPO | Admitting: Family Medicine

## 2022-02-06 VITALS — BP 130/66 | HR 63 | Temp 97.3°F | Ht 65.5 in | Wt 255.2 lb

## 2022-02-06 DIAGNOSIS — E1122 Type 2 diabetes mellitus with diabetic chronic kidney disease: Secondary | ICD-10-CM

## 2022-02-06 DIAGNOSIS — N1832 Chronic kidney disease, stage 3b: Secondary | ICD-10-CM | POA: Diagnosis not present

## 2022-02-06 DIAGNOSIS — E1169 Type 2 diabetes mellitus with other specified complication: Secondary | ICD-10-CM

## 2022-02-06 DIAGNOSIS — N184 Chronic kidney disease, stage 4 (severe): Secondary | ICD-10-CM

## 2022-02-06 DIAGNOSIS — E785 Hyperlipidemia, unspecified: Secondary | ICD-10-CM

## 2022-02-06 DIAGNOSIS — I10 Essential (primary) hypertension: Secondary | ICD-10-CM | POA: Diagnosis not present

## 2022-02-06 NOTE — Patient Instructions (Addendum)
Think about adding exercise 30 minutes per day  Strength training is most important   A1c is improved  Keep working on healthy diet   Take care of yourself   Follow up for annual exam in 3-6 months

## 2022-02-06 NOTE — Assessment & Plan Note (Signed)
bp in fair control at this time  BP Readings from Last 1 Encounters:  02/06/22 130/66   No changes needed Most recent labs reviewed  Disc lifstyle change with low sodium diet and exercise  Plan to continue  Losartan 25 mg daily  Metoprolol 25 mg daily  Nephrology f/u in feb

## 2022-02-06 NOTE — Assessment & Plan Note (Signed)
Lab Results  Component Value Date   HGBA1C 6.4 01/31/2022   This is down from 6.5  Taking glipizide 5 mg in am - nog getting the low readings that she had at a higher dose Still tolerates semaglutide 1 mg weekly  Improved diet for 2 wk/ using wt watchers  Also more water  Disc plan to add exercise including strength training  Taking arb and statin  Sees nephrology (microalb not done here)

## 2022-02-06 NOTE — Assessment & Plan Note (Signed)
GFR nere down to 39/57 last check She is working on water intake and glucose control For nephrology f/u in [place]

## 2022-02-06 NOTE — Progress Notes (Signed)
Subjective:    Patient ID: Jamie [name], female    DOB: October 04, 1955, 67 y.o.   MRN: [id number]  HPI Pt presents for DM2 and chronic medical problems   Wt Readings from Last 3 Encounters:  02/06/22 255 lb 4 oz (115.8 kg)  11/01/21 251 lb 8 oz (114.1 kg)  08/01/21 253 lb (114.8 kg)   41.83 kg/m  Feeling good  Holidays were good  Thinks a vitamin C supplement to keep from getting sick    HTN bp is stable today  No cp or palpitations or headaches or edema  No side effects to medicines  BP Readings from Last 3 Encounters:  02/06/22 130/66  11/01/21 126/64  08/01/21 110/64     Losartan 25 mg daily  Metoprolol 25 mg daily   Nephrology follow up in feb  Last visit nov 1  Nothing new  Lab Results  Component Value Date   CREATININE 1.38 (H) 01/31/2022   BUN 18 01/31/2022   NA 138 01/31/2022   K 4.0 01/31/2022   CL 103 01/31/2022   CO2 26 01/31/2022   GFR 39.57-this was down a bit   Was up to 13 at last visit     DM2 Lab Results  Component Value Date   HGBA1C 6.4 01/31/2022   This is down from 6.5   Glipizide 5 mg in am (was cut due to some low readings at night)  Usually 70s/80s when she first gets up and then goes up to 120 as soon as she gets moving around  No episodes of low sugar  Semaglutide 1 mg weekly- still tolerating that   No unexpected highs  Not taking as many readings later in the day   Diet has been better for the past 2 weeks Using weight watchers plan and logging all of her food  Wt goes up and down a few lb   It has helped to drink more water  Portion size is small Eating fruit and veggies   She eats a keto friendly bread - 40 cal per slice  As a rule- little to no pasta (whole grain instead)   Too much bad eating over holidays   Exercise- none  Has good intentions  Silver sneakers has at home videos -thinking about  [facility]   Takes statin and arb  Lab Results  Component Value Date    MICROALBUR 3.8 (H) 12/13/2020   MICROALBUR 10.5 (H) 05/08/2019   Sees nephrologist    Hyperlipidemia Lab Results  Component Value Date   CHOL 129 01/31/2022   CHOL 131 07/28/2021   CHOL 82 12/13/2020   Lab Results  Component Value Date   HDL 57.20 01/31/2022   HDL 53.30 07/28/2021   HDL 30.90 (L) 12/13/2020   Lab Results  Component Value Date   LDLCALC 35 01/31/2022   LDLCALC 40 07/28/2021   [place] 15 12/13/2020   Lab Results  Component Value Date   TRIG 182.0 (H) 01/31/2022   TRIG 190.0 (H) 07/28/2021   TRIG 178.0 (H) 12/13/2020   Lab Results  Component Value Date   CHOLHDL 2 01/31/2022   CHOLHDL 2 07/28/2021   CHOLHDL 3 12/13/2020   Lab Results  Component Value Date   LDLDIRECT 62.0 11/06/2019   LDLDIRECT 64.0 05/08/2019   LDLDIRECT 62.0 01/30/2018    Atorvastatin 5 mg every other day   Red meat twice per week maximum Mostly chicken and [place]  LDL is well controlled  Patient Active Problem List   Diagnosis Date Noted   Abnormal SPEP 04/28/2021   Abnormal EKG 04/26/2021   Ileostomy dysfunction ([place]) 08/31/2020   CKD (chronic kidney disease) stage 3, GFR 30-59 ml/min (HCC) 05/31/2020   Aortic atherosclerosis ([place]) 03/01/2020   Urinary frequency 12/23/2019   Elevated serum creatinine 05/14/2019   Dermatitis 07/01/2018   Left shoulder pain 11/30/2017   Palpitations 07/05/2017   Ostomy nurse consultation 18/84/1660   Lichen planus 63/01/6008   Hyperlipidemia associated with type 2 diabetes mellitus ([place]) 04/10/2016   Obesity (BMI 30-39.9) 01/08/2015   Red blood cell antibody positive 07/02/2013   Elevated C-reactive protein (CRP) 05/15/2012   Routine general medical examination at a health care facility 09/24/2011   Apnea 11/14/2010   Nevus of lower leg 11/14/2010   Other screening mammogram 10/31/2010   Stress reaction, emotional 05/02/2010   Compulsive overeater 05/02/2010   Arthritis    H/O small bowel obstruction    KNEE PAIN  10/14/2009   HOARSENESS 07/21/2009   DM (diabetes mellitus), type 2 with renal complications ([place]) 93/23/5573   ROSACEA 09/12/2007   Essential hypertension 05/15/2007   OSA (obstructive sleep apnea) 12/13/2006   Ulcerative colitis ([place]) 07/12/2006   Past Medical History:  Diagnosis Date   Anemia    Arthritis    generalized   Bowel obstruction (HCC)    repeated (? possible adhesions) neg EGD   Chronic kidney disease    Colitis    with colectomy   Diabetes mellitus without complication ([place])    Dyspnea    covid   Gall stones 01/23/2006   Gastroenteritis 07/25/2014   hosp for IVF    GERD (gastroesophageal reflux disease)    History of kidney stones    HTN (hypertension)    Hyperglycemia    mild-monitors A1C   Obesity    Pneumonia    Rosacea    Sleep apnea    CPAP everynight   Past Surgical History:  Procedure Laterality Date   [place]   has ileostomy   COLON SURGERY  2021   removed rectum   COLONOSCOPY  08/1991   COLOSTOMY REVISION  12/06/2011   Procedure: COLOSTOMY REVISION;  Surgeon: Leighton Ruff, MD;  Location: [facility];  Service: General;  Laterality: Right;  OSTOMY revision   CYSTOSCOPY/URETEROSCOPY/HOLMIUM LASER/STENT PLACEMENT Left 04/11/2021   Procedure: CYSTOSCOPY/URETEROSCOPY/HOLMIUM LASER/STENT PLACEMENT;  Surgeon: Hollice Espy, MD;  Location: [facility];  Service: Urology;  Laterality: Left;   EXAMINATION UNDER ANESTHESIA  12/06/2011   Procedure: EXAM UNDER ANESTHESIA;  Surgeon: Leighton Ruff, MD;  Location: [employer] [facility];  Service: General;  Laterality: N/A;  rectal exam under anesthesia, anal dilation, pouchoscopy     TOTAL ABDOMINAL HYSTERECTOMY  05/2006   for abscessed ovaries   Social History   Tobacco Use   Smoking status: Never   Smokeless tobacco: Never  Vaping Use   Vaping Use: Never used  Substance Use Topics   Alcohol use: No    Alcohol/week: 0.0 standard drinks of alcohol    Drug use: No   Family History  Problem Relation Age of Onset   Hypertension Mother    Hyperlipidemia Mother    Cirrhosis Mother        NASH   Diabetes Mother    Liver cancer Father        resection secondary to mets   Cancer Father        colon   Hyperlipidemia  Father    Hypertension Father    Allergies Father    Ulcerative colitis Father    Allergies Brother    Gastric cancer Brother    Breast cancer Paternal Grandmother    Breast cancer Cousin 40       paternal cousins   Allergies  Allergen Reactions   Ciprofloxacin Rash    Erythema and pruritis around IV site, erythema and rash along vein   Other Rash   Jardiance [Empagliflozin]     Increase in Cr    Lisinopril     REACTION: felt bad/ stomach hurt/ weak and tired   Metformin And Related     GI   Sulfa Antibiotics Rash   Current Outpatient Medications on File Prior to Visit  Medication Sig Dispense Refill   acetaminophen (TYLENOL) 325 MG tablet Take 650 mg by mouth every 6 (six) hours as needed for moderate pain.     albuterol (VENTOLIN HFA) 108 (90 Base) MCG/ACT inhaler Inhale 2 puffs into the lungs every 4 (four) hours as needed for wheezing or shortness of breath. 8 g 2   atorvastatin (LIPITOR) 10 MG tablet TAKE 1/2 PILL BY MOUTH EVERY OTHER DAY 24 tablet 1   Blood Glucose Monitoring Suppl (ACCU-CHEK GUIDE) w/Device KIT To check glucose daily and as needed for type 2 diabetes 1 kit 0   Cholecalciferol (VITAMIN D3) 25 MCG (1000 UT) CAPS Take 1,000 Units by mouth daily.     esomeprazole (NEXIUM) 20 MG capsule Take 20 mg by mouth every other day.     fluticasone (FLONASE) 50 MCG/ACT nasal spray Place 2 sprays into both nostrils at bedtime.     glipiZIDE (GLUCOTROL) 5 MG tablet Take 1 tablet (5 mg total) by mouth daily before breakfast. 1 tablet 0   glucose blood (ACCU-CHEK GUIDE) test strip TO CHECK GLUCOSE DAILY AND AS NEEDED FOR TYPE 2 DIABETES 100 strip 1   hyoscyamine (LEVSIN SL) 0.125 MG SL tablet Take 1 tablet  (0.125 mg total) by mouth every 4 (four) hours as needed. For abdominal cramps 90 tablet 3   Lancets (ACCU-CHEK MULTICLIX) lancets To check glucose daily and as needed for diabetes type 2 100 each 3   losartan (COZAAR) 25 MG tablet Take 25 mg by mouth daily.     melatonin 1 MG TABS tablet Take 1.5 mg by mouth at bedtime.     metoprolol tartrate (LOPRESSOR) 25 MG tablet TAKE 1 TABLET BY MOUTH EVERY DAY 90 tablet 1   Multiple Vitamin (MULTIVITAMIN) tablet Take 1 tablet by mouth daily.     Multiple Vitamins-Minerals (EMERGEN-C IMMUNE PO) Take 1 packet by mouth daily.     NON FORMULARY SLEEPS WITH BI-PAP     Semaglutide, 1 MG/DOSE, (OZEMPIC, 1 MG/DOSE,) 4 MG/3ML SOPN INJECT 1 MG UNDER THE SKIN ONE DAY A WEEK 3 mL 3   Semaglutide, 1 MG/DOSE, 2 MG/1.5ML SOPN Inject 1 mg into the skin once a week. 2 mL 2   zolpidem (AMBIEN) 10 MG tablet Take 0.5-1 tablets (5-10 mg total) by mouth at bedtime as needed. for sleep 30 tablet 1   No current facility-administered medications on file prior to visit.    Review of Systems  Constitutional:  Positive for fatigue. Negative for activity change, appetite change, fever and unexpected weight change.  HENT:  Negative for congestion, ear pain, rhinorrhea, sinus pressure and sore throat.   Eyes:  Negative for pain, redness and visual disturbance.  Respiratory:  Negative for cough, shortness of breath  and wheezing.   Cardiovascular:  Negative for chest pain and palpitations.  Gastrointestinal:  Negative for abdominal pain, blood in stool, constipation and diarrhea.  Endocrine: Negative for polydipsia and polyuria.  Genitourinary:  Negative for dysuria, frequency and urgency.  Musculoskeletal:  Negative for arthralgias, back pain and myalgias.  Skin:  Negative for pallor and rash.  Allergic/Immunologic: Negative for environmental allergies.  Neurological:  Negative for dizziness, syncope and headaches.  Hematological:  Negative for adenopathy. Does not bruise/bleed  easily.  Psychiatric/Behavioral:  Negative for decreased concentration and dysphoric mood. The patient is not nervous/anxious.        Objective:   Physical Exam Constitutional:      General: She is not in acute distress.    Appearance: Normal appearance. She is well-developed. She is obese. She is not ill-appearing or diaphoretic.  HENT:     Head: Normocephalic and atraumatic.     Mouth/Throat:     Mouth: Mucous membranes are moist.  Eyes:     General: No scleral icterus.    Conjunctiva/sclera: Conjunctivae normal.     Pupils: Pupils are equal, round, and reactive to light.  Neck:     Thyroid: No thyromegaly.     Vascular: No carotid bruit or JVD.  Cardiovascular:     Rate and Rhythm: Normal rate and regular rhythm.     Heart sounds: Normal heart sounds.     No gallop.  Pulmonary:     Effort: Pulmonary effort is normal. No respiratory distress.     Breath sounds: Normal breath sounds. No stridor. No wheezing, rhonchi or rales.  Abdominal:     General: There is no distension or abdominal bruit.     Palpations: Abdomen is soft.  Musculoskeletal:     Cervical back: Normal range of motion and neck supple.     Right lower leg: No edema.     Left lower leg: No edema.  Lymphadenopathy:     Cervical: No cervical adenopathy.  Skin:    General: Skin is warm and dry.     Coloration: Skin is not pale.     Findings: No rash.  Neurological:     Mental Status: She is alert.     Coordination: Coordination normal.     Deep Tendon Reflexes: Reflexes are normal and symmetric. Reflexes normal.  Psychiatric:        Mood and Affect: Mood normal.           Assessment & Plan:   Problem List Items Addressed This Visit       Cardiovascular and Mediastinum   Essential hypertension    bp in fair control at this time  BP Readings from Last 1 Encounters:  02/06/22 130/66  No changes needed Most recent labs reviewed  Disc lifstyle change with low sodium diet and exercise  Plan to  continue  Losartan 25 mg daily  Metoprolol 25 mg daily  Nephrology f/u in feb        Endocrine   DM (diabetes mellitus), type 2 with renal complications ([place]) - Primary    Lab Results  Component Value Date   HGBA1C 6.4 01/31/2022  This is down from 6.5  Taking glipizide 5 mg in am - nog getting the low readings that she had at a higher dose Still tolerates semaglutide 1 mg weekly  Improved diet for 2 wk/ using wt watchers  Also more water  Disc plan to add exercise including strength training  Taking arb and statin  Sees  nephrology (microalb not done here)         Hyperlipidemia associated with type 2 diabetes mellitus ([place])    Disc goals for lipids and reasons to control them Rev last labs with pt Rev low sat fat diet in detail Atorvastatin 5 mg every othe day  Good LDL control at 35 Expect trig to improve more as blood glucose does         Genitourinary   CKD (chronic kidney disease) stage 3, GFR 30-59 ml/min (HCC)    GFR nere down to 39/57 last check She is working on water intake and glucose control For nephrology f/u in [place]

## 2022-02-06 NOTE — Assessment & Plan Note (Signed)
Disc goals for lipids and reasons to control them Rev last labs with pt Rev low sat fat diet in detail Atorvastatin 5 mg every othe day  Good LDL control at 35 Expect trig to improve more as blood glucose does

## 2022-02-08 ENCOUNTER — Encounter: Payer: Self-pay | Admitting: Family Medicine

## 2022-02-10 MED ORDER — TIRZEPATIDE 5 MG/0.5ML ~~LOC~~ SOAJ: 5.0000 mg | SUBCUTANEOUS | 3 refills | Status: DC

## 2022-02-11 ENCOUNTER — Other Ambulatory Visit: Payer: Self-pay | Admitting: Family Medicine

## 2022-03-06 ENCOUNTER — Telehealth: Payer: Self-pay

## 2022-03-06 NOTE — Telephone Encounter (Signed)
Spoke to patient and requested that she bring SD card to 2/13 visit.

## 2022-03-07 ENCOUNTER — Ambulatory Visit: Payer: Medicare PPO | Admitting: Internal Medicine

## 2022-03-07 ENCOUNTER — Encounter: Payer: Self-pay | Admitting: Internal Medicine

## 2022-03-07 VITALS — BP 122/74 | HR 85 | Temp 98.0°F | Ht 65.5 in | Wt 251.8 lb

## 2022-03-07 DIAGNOSIS — G4733 Obstructive sleep apnea (adult) (pediatric): Secondary | ICD-10-CM | POA: Diagnosis not present

## 2022-03-07 NOTE — Progress Notes (Signed)
  [facility] Consultation     Synopsis Establish care for sleep apnea several years ago  **CPAP download data 11/28/2017-12/28/2018>> raw data personally reviewed, she greater than 4 hours is 19/30 days (63%).  Average usage on days used for 33 minutes.  Pressure is 19/10.  Residual AHI is 5.9 leaks are minimal. **Review of 30 days of download data as of 12/21/16; usage is 30/30 days.  Average usage on days used is 5 hours 51 minutes.  Current settings are her curve 10 V auto; BiPAP 19/10 residual AHI is 3.2.  -home sleep study from the December 20th 2012:AHI 37.  CPAP download January 2023 87 (compliance for days 57 compliance for greater than 4 hours AHI reduced to 2.5 BiPAP IPAP 19 EPAP 10   CC Follow up OSA   HPI:   On BiPAP full facemask  Compliance report reviewed in detail with patient Previous AHI was 37 current AHI is 2.3  No exacerbation at this time No evidence of heart failure at this time No evidence or signs of infection at this time No respiratory distress No fevers, chills, nausea, vomiting, diarrhea No evidence of lower extremity edema No evidence hemoptysis  We have discussed wearing her BiPAP machine after she wakes up Patient does have ileostomy that she takes care of at night   BP 122/74 (BP Location: Left Arm, Cuff Size: Large)   Pulse 85   Temp 98 F (36.7 C) (Temporal)   Ht 5' 5.5" (1.664 m)   Wt 251 lb 12.8 oz (114.2 kg)   SpO2 100%   BMI 41.26 kg/m      Review of Systems: Gen:  Denies  fever, sweats, chills weight loss  HEENT: Denies blurred vision, double vision, ear pain, eye pain, hearing loss, nose bleeds, sore throat Cardiac:  No dizziness, chest pain or heaviness, chest tightness,edema, No JVD Resp:   No cough, -sputum production, -shortness of breath,-wheezing, -hemoptysis,  Other:  All other systems negative    Physical Examination:   General Appearance: No distress  EYES PERRLA, EOM intact.   NECK  Supple, No JVD Pulmonary: normal breath sounds, No wheezing.  CardiovascularNormal S1,S2.  No m/r/g.   Abdomen: Benign, Soft, non-tender. ALL OTHER ROS ARE NEGATIVE      Assessment and Plan:  67 year old white female with a diagnosis of SEVERE OSA  Severe OSA Currently on BiPAP and working well Benefits from therapy IPAP 19 EPAP 10 AHI reduced to 2.3 BENEFITS FROM THERAPY AHI IS WELL CONTROLLED Compliance report reviewed in detail with patient  MEDICATION ADJUSTMENTS/LABS AND TESTS ORDERED: CONTINUE BIPAP AS PRESCRIBED Albuterol as needed  CURRENT MEDICATIONS REVIEWED AT LENGTH WITH PATIENT TODAY   Patient satisfied with Plan of action and management. All questions answered  Follow-up in 1 year  Total time spent 22 minutes   Alexina Niccoli Patricia Pesa, M.D.  [facility]  Medical Director [place] Director [facility] Cardio-Pulmonary Department

## 2022-03-07 NOTE — Patient Instructions (Addendum)
CONTINUE BIPAP AS PRESCRIBED GREAT JOB!

## 2022-03-17 DIAGNOSIS — K5190 Ulcerative colitis, unspecified, without complications: Secondary | ICD-10-CM | POA: Diagnosis not present

## 2022-03-17 DIAGNOSIS — E119 Type 2 diabetes mellitus without complications: Secondary | ICD-10-CM | POA: Diagnosis not present

## 2022-03-17 DIAGNOSIS — Z932 Ileostomy status: Secondary | ICD-10-CM | POA: Diagnosis not present

## 2022-03-17 DIAGNOSIS — G4733 Obstructive sleep apnea (adult) (pediatric): Secondary | ICD-10-CM | POA: Diagnosis not present

## 2022-03-17 DIAGNOSIS — R932 Abnormal findings on diagnostic imaging of liver and biliary tract: Secondary | ICD-10-CM | POA: Diagnosis not present

## 2022-03-23 ENCOUNTER — Other Ambulatory Visit: Payer: Self-pay | Admitting: Family Medicine

## 2022-03-25 DIAGNOSIS — H43812 Vitreous degeneration, left eye: Secondary | ICD-10-CM | POA: Diagnosis not present

## 2022-04-04 DIAGNOSIS — N1831 Chronic kidney disease, stage 3a: Secondary | ICD-10-CM | POA: Diagnosis not present

## 2022-04-04 DIAGNOSIS — E1122 Type 2 diabetes mellitus with diabetic chronic kidney disease: Secondary | ICD-10-CM | POA: Diagnosis not present

## 2022-04-06 DIAGNOSIS — N1832 Chronic kidney disease, stage 3b: Secondary | ICD-10-CM | POA: Diagnosis not present

## 2022-04-06 DIAGNOSIS — I10 Essential (primary) hypertension: Secondary | ICD-10-CM | POA: Diagnosis not present

## 2022-04-06 DIAGNOSIS — E1122 Type 2 diabetes mellitus with diabetic chronic kidney disease: Secondary | ICD-10-CM | POA: Diagnosis not present

## 2022-04-06 DIAGNOSIS — R809 Proteinuria, unspecified: Secondary | ICD-10-CM | POA: Diagnosis not present

## 2022-04-07 ENCOUNTER — Ambulatory Visit: Payer: Medicare PPO | Admitting: Family Medicine

## 2022-04-07 ENCOUNTER — Encounter: Payer: Self-pay | Admitting: Family Medicine

## 2022-04-07 VITALS — BP 134/66 | HR 79 | Temp 97.9°F | Ht 65.5 in | Wt 259.0 lb

## 2022-04-07 DIAGNOSIS — H9191 Unspecified hearing loss, right ear: Secondary | ICD-10-CM | POA: Diagnosis not present

## 2022-04-07 DIAGNOSIS — R0981 Nasal congestion: Secondary | ICD-10-CM | POA: Diagnosis not present

## 2022-04-07 NOTE — Patient Instructions (Signed)
Go up on flonase to twice daily  Also use mucinex D if helpful  Update [place] in a week (earlier if needed) if not improving If ear pain or fever let us know  We would consider ENT referral   We could also consider prednisone

## 2022-04-07 NOTE — Progress Notes (Signed)
Subjective:    Patient ID: Jamie [name], female    DOB: May 05, 1955, 67 y.o.   MRN: [id number]  HPI Pt presents with hearing problem   Wt Readings from Last 3 Encounters:  04/07/22 259 lb (117.5 kg)  03/07/22 251 lb 12.8 oz (114.2 kg)  02/06/22 255 lb 4 oz (115.8 kg)   42.44 kg/m  Vitals:   04/07/22 1224  BP: 134/66  Pulse: 79  Temp: 97.9 F (36.6 C)  SpO2: 99%     For 2 days notes difficulty hearing out of R ear  Worse yesterday after shower Muffled and distorted sound   Roaring/ tinnitus last night worse in R  Constant  Once yesterday - fleeting pain   Not a lot of ear pressure Some sinus pressure  No colored nasal d/c   Uses occ mucinex D Flonase daily at night    Some pressure in head Headache on/off for 2 weeks   Had a vitreous detatchment in L eye  Saw eye specialist  Did not need surgery  No retinal issue  Observing   Had good visit with nephrology yesterday  Patient Active Problem List   Diagnosis Date Noted   Hearing loss, right 04/07/2022   Sinus congestion 04/07/2022   Abnormal SPEP 04/28/2021   Abnormal EKG 04/26/2021   Ileostomy dysfunction ([place]) 08/31/2020   CKD (chronic kidney disease) stage 3, GFR 30-59 ml/min ([place]) 05/31/2020   Aortic atherosclerosis ([place]) 03/01/2020   Urinary frequency 12/23/2019   Elevated serum creatinine 05/14/2019   Dermatitis 07/01/2018   Left shoulder pain 11/30/2017   Palpitations 07/05/2017   Ostomy nurse consultation [id number]   Lichen planus [id number]   Hyperlipidemia associated with type 2 diabetes mellitus ([place]) 04/10/2016   Obesity (BMI 30-39.9) 01/08/2015   Red blood cell antibody positive 07/02/2013   Elevated C-reactive protein (CRP) 05/15/2012   Routine general medical examination at a health care facility 09/24/2011   Apnea 11/14/2010   Nevus of lower leg 11/14/2010   Other screening mammogram 10/31/2010   Stress reaction, emotional 05/02/2010   Compulsive overeater 05/02/2010    Arthritis    H/O small bowel obstruction    KNEE PAIN 10/14/2009   DM (diabetes mellitus), type 2 with renal complications ([place]) 0000000   ROSACEA 09/12/2007   Essential hypertension 05/15/2007   OSA (obstructive sleep apnea) 12/13/2006   Ulcerative colitis ([place]) 07/12/2006   Past Medical History:  Diagnosis Date   Anemia    Arthritis    generalized   Bowel obstruction (HCC)    repeated (? possible adhesions) neg EGD   Chronic kidney disease    Colitis    with colectomy   Diabetes mellitus without complication ([place])    Dyspnea    covid   Gall stones 01/23/2006   Gastroenteritis 07/25/2014   hosp for IVF    GERD (gastroesophageal reflux disease)    History of kidney stones    HTN (hypertension)    Hyperglycemia    mild-monitors A1C   Obesity    Pneumonia    Rosacea    Sleep apnea    CPAP everynight   Past Surgical History:  Procedure Laterality Date   [place]   has ileostomy   COLON SURGERY  2021   removed rectum   COLONOSCOPY  08/1991   COLOSTOMY REVISION  12/06/2011   Procedure: COLOSTOMY REVISION;  Surgeon: Leighton Ruff, MD;  Location: [facility];  Service: General;  Laterality: Right;  OSTOMY revision   CYSTOSCOPY/URETEROSCOPY/HOLMIUM LASER/STENT PLACEMENT Left 04/11/2021   Procedure: CYSTOSCOPY/URETEROSCOPY/HOLMIUM LASER/STENT PLACEMENT;  Surgeon: Hollice Espy, MD;  Location: [facility];  Service: Urology;  Laterality: Left;   EXAMINATION UNDER ANESTHESIA  12/06/2011   Procedure: EXAM UNDER ANESTHESIA;  Surgeon: Leighton Ruff, MD;  Location: [facility];  Service: General;  Laterality: N/A;  rectal exam under anesthesia, anal dilation, pouchoscopy     TOTAL ABDOMINAL HYSTERECTOMY  05/2006   for abscessed ovaries   Social History   Tobacco Use   Smoking status: Never   Smokeless tobacco: Never  Vaping Use   Vaping Use: Never used  Substance Use Topics   Alcohol use: No    Alcohol/week: 0.0  standard drinks of alcohol   Drug use: No   Family History  Problem Relation Age of Onset   Hypertension Mother    Hyperlipidemia Mother    Cirrhosis Mother        NASH   Diabetes Mother    Liver cancer Father        resection secondary to mets   Cancer Father        colon   Hyperlipidemia Father    Hypertension Father    Allergies Father    Ulcerative colitis Father    Allergies Brother    Gastric cancer Brother    Breast cancer Paternal Grandmother    Breast cancer Cousin 64       paternal cousins   Allergies  Allergen Reactions   Ciprofloxacin Rash    Erythema and pruritis around IV site, erythema and rash along vein   Other Rash   Jardiance [Empagliflozin]     Increase in Cr    Lisinopril     REACTION: felt bad/ stomach hurt/ weak and tired   Metformin And Related     GI   Sulfa Antibiotics Rash   Current Outpatient Medications on File Prior to Visit  Medication Sig Dispense Refill   ACCU-CHEK GUIDE test strip TO CHECK GLUCOSE DAILY AND AS NEEDED FOR TYPE 2 DIABETES 100 strip 1   acetaminophen (TYLENOL) 325 MG tablet Take 650 mg by mouth every 6 (six) hours as needed for moderate pain.     albuterol (VENTOLIN HFA) 108 (90 Base) MCG/ACT inhaler Inhale 2 puffs into the lungs every 4 (four) hours as needed for wheezing or shortness of breath. 8 g 2   atorvastatin (LIPITOR) 10 MG tablet TAKE 1/2 PILL BY MOUTH EVERY OTHER DAY 24 tablet 1   Blood Glucose Monitoring Suppl (ACCU-CHEK GUIDE) w/Device KIT To check glucose daily and as needed for type 2 diabetes 1 kit 0   Cholecalciferol (VITAMIN D3) 25 MCG (1000 UT) CAPS Take 1,000 Units by mouth daily.     esomeprazole (NEXIUM) 20 MG capsule Take 20 mg by mouth every other day.     fluticasone (FLONASE) 50 MCG/ACT nasal spray Place 2 sprays into both nostrils at bedtime.     glipiZIDE (GLUCOTROL) 5 MG tablet Take 1 tablet (5 mg total) by mouth daily before breakfast. 90 tablet 0   hyoscyamine (LEVSIN SL) 0.125 MG SL tablet  Take 1 tablet (0.125 mg total) by mouth every 4 (four) hours as needed. For abdominal cramps 90 tablet 3   Lancets (ACCU-CHEK MULTICLIX) lancets To check glucose daily and as needed for diabetes type 2 100 each 3   losartan (COZAAR) 25 MG tablet Take 25 mg by mouth daily.     melatonin 1 MG TABS tablet Take 1.5  mg by mouth at bedtime.     metoprolol tartrate (LOPRESSOR) 25 MG tablet TAKE 1 TABLET BY MOUTH EVERY DAY 90 tablet 1   Multiple Vitamin (MULTIVITAMIN) tablet Take 1 tablet by mouth daily.     Multiple Vitamins-Minerals (EMERGEN-C IMMUNE PO) Take 1 packet by mouth daily.     NON FORMULARY SLEEPS WITH BI-PAP     tirzepatide (MOUNJARO) 5 MG/0.5ML Pen Inject 5 mg into the skin once a week. 6 mL 3   zolpidem (AMBIEN) 10 MG tablet Take 0.5-1 tablets (5-10 mg total) by mouth at bedtime as needed. for sleep 30 tablet 1   No current facility-administered medications on file prior to visit.    Review of Systems  Constitutional:  Negative for activity change, appetite change, fatigue, fever and unexpected weight change.  HENT:  Positive for congestion and hearing loss. Negative for ear discharge, ear pain, facial swelling, rhinorrhea, sinus pressure, sore throat and trouble swallowing.   Eyes:  Negative for pain, redness and visual disturbance.  Respiratory:  Negative for cough, shortness of breath and wheezing.   Cardiovascular:  Negative for chest pain and palpitations.  Gastrointestinal:  Negative for abdominal pain, blood in stool, constipation and diarrhea.  Endocrine: Negative for polydipsia and polyuria.  Genitourinary:  Negative for dysuria, frequency and urgency.  Musculoskeletal:  Negative for arthralgias, back pain and myalgias.  Skin:  Negative for pallor and rash.  Allergic/Immunologic: Negative for environmental allergies.  Neurological:  Negative for dizziness, syncope and headaches.  Hematological:  Negative for adenopathy. Does not bruise/bleed easily.   Psychiatric/Behavioral:  Negative for decreased concentration and dysphoric mood. The patient is not nervous/anxious.        Objective:   Physical Exam Constitutional:      General: She is not in acute distress.    Appearance: Normal appearance. She is well-developed. She is obese. She is not ill-appearing.  HENT:     Head: Normocephalic and atraumatic.     Comments: No facial or temporal tenderness    Right Ear: Ear canal and external ear normal.     Left Ear: Ear canal and external ear normal.     Ears:     Comments: TMS are dull bilaterally  No effusions  No erythema     Nose:     Comments: Boggy nares     Mouth/Throat:     Mouth: Mucous membranes are moist.     Pharynx: Oropharynx is clear.  Eyes:     General:        Right eye: No discharge.        Left eye: No discharge.     Conjunctiva/sclera: Conjunctivae normal.     Pupils: Pupils are equal, round, and reactive to light.  Neck:     Thyroid: No thyromegaly.     Vascular: No carotid bruit or JVD.  Cardiovascular:     Rate and Rhythm: Normal rate and regular rhythm.     Heart sounds: Normal heart sounds.     No gallop.  Pulmonary:     Effort: Pulmonary effort is normal. No respiratory distress.     Breath sounds: Normal breath sounds. No wheezing or rales.  Abdominal:     General: There is no distension or abdominal bruit.     Palpations: Abdomen is soft.  Musculoskeletal:     Cervical back: Normal range of motion and neck supple. No tenderness.     Right lower leg: No edema.     Left lower leg:  No edema.  Lymphadenopathy:     Cervical: No cervical adenopathy.  Skin:    General: Skin is warm and dry.     Coloration: Skin is not pale.     Findings: No rash.  Neurological:     Mental Status: She is alert.     Cranial Nerves: No cranial nerve deficit.     Motor: No weakness.     Coordination: Coordination normal.     Deep Tendon Reflexes: Reflexes are normal and symmetric. Reflexes normal.  Psychiatric:         Mood and Affect: Mood normal.           Assessment & Plan:   Problem List Items Addressed This Visit       Respiratory   Sinus congestion    May be due to allergies May be causing her hearing issues on R  Inst to inc her flonase to bid Update in a week        Nervous and Auditory   Hearing loss, right - Primary    Sudden/ also intermittent and a bit improved today  Suspect due to ETD in setting of nasal congestion from allergies Inst to inc flonse to bid and update in a week  Watch for pain/fever/other  Watch for facial pain  Update if not starting to improve in a week or if worsening   Prednisone is option if not helpful (is dm so would like to avoid) Consider ref to ENT if not improving

## 2022-04-07 NOTE — Assessment & Plan Note (Signed)
May be due to allergies May be causing her hearing issues on R  Inst to inc her flonase to bid Update in a week

## 2022-04-07 NOTE — Assessment & Plan Note (Signed)
Sudden/ also intermittent and a bit improved today  Suspect due to ETD in setting of nasal congestion from allergies Inst to inc flonse to bid and update in a week  Watch for pain/fever/other  Watch for facial pain  Update if not starting to improve in a week or if worsening   Prednisone is option if not helpful (is dm so would like to avoid) Consider ref to ENT if not improving

## 2022-05-01 DIAGNOSIS — H43812 Vitreous degeneration, left eye: Secondary | ICD-10-CM | POA: Diagnosis not present

## 2022-05-01 DIAGNOSIS — H2512 Age-related nuclear cataract, left eye: Secondary | ICD-10-CM | POA: Diagnosis not present

## 2022-05-02 ENCOUNTER — Other Ambulatory Visit: Payer: Self-pay | Admitting: Family Medicine

## 2022-05-09 DIAGNOSIS — B078 Other viral warts: Secondary | ICD-10-CM | POA: Diagnosis not present

## 2022-05-09 DIAGNOSIS — D2261 Melanocytic nevi of right upper limb, including shoulder: Secondary | ICD-10-CM | POA: Diagnosis not present

## 2022-05-09 DIAGNOSIS — D2262 Melanocytic nevi of left upper limb, including shoulder: Secondary | ICD-10-CM | POA: Diagnosis not present

## 2022-05-09 DIAGNOSIS — D2271 Melanocytic nevi of right lower limb, including hip: Secondary | ICD-10-CM | POA: Diagnosis not present

## 2022-05-09 DIAGNOSIS — D225 Melanocytic nevi of trunk: Secondary | ICD-10-CM | POA: Diagnosis not present

## 2022-05-09 DIAGNOSIS — L400 Psoriasis vulgaris: Secondary | ICD-10-CM | POA: Diagnosis not present

## 2022-05-09 DIAGNOSIS — L821 Other seborrheic keratosis: Secondary | ICD-10-CM | POA: Diagnosis not present

## 2022-05-09 DIAGNOSIS — D2272 Melanocytic nevi of left lower limb, including hip: Secondary | ICD-10-CM | POA: Diagnosis not present

## 2022-05-10 ENCOUNTER — Ambulatory Visit: Payer: Medicare PPO | Admitting: Urology

## 2022-05-10 ENCOUNTER — Emergency Department: Payer: Medicare PPO

## 2022-05-10 ENCOUNTER — Ambulatory Visit
Admission: RE | Admit: 2022-05-10 | Discharge: 2022-05-10 | Disposition: A | Discharge status: Home / Self Care | Payer: Medicare PPO | Source: Ambulatory Visit | Attending: Urology | Admitting: Urology

## 2022-05-10 ENCOUNTER — Inpatient Hospital Stay
Admission: EM | Admit: 2022-05-10 | Discharge: 2022-05-24 | DRG: 460 | Disposition: A | Discharge status: Home Health | Payer: Medicare PPO | Attending: Internal Medicine | Admitting: Internal Medicine

## 2022-05-10 ENCOUNTER — Ambulatory Visit
Admission: RE | Admit: 2022-05-10 | Discharge: 2022-05-10 | Disposition: A | Discharge status: Home / Self Care | Payer: Medicare PPO | Source: Home / Self Care | Attending: Urology | Admitting: Urology

## 2022-05-10 ENCOUNTER — Encounter: Payer: Self-pay | Admitting: Emergency Medicine

## 2022-05-10 VITALS — BP 136/71 | HR 80 | Ht 65.5 in | Wt 265.0 lb

## 2022-05-10 DIAGNOSIS — Z6841 Body Mass Index (BMI) 40.0 and over, adult: Secondary | ICD-10-CM

## 2022-05-10 DIAGNOSIS — N132 Hydronephrosis with renal and ureteral calculous obstruction: Secondary | ICD-10-CM | POA: Diagnosis not present

## 2022-05-10 DIAGNOSIS — Y9222 Religious institution as the place of occurrence of the external cause: Secondary | ICD-10-CM | POA: Diagnosis not present

## 2022-05-10 DIAGNOSIS — N136 Pyonephrosis: Secondary | ICD-10-CM | POA: Diagnosis not present

## 2022-05-10 DIAGNOSIS — S22088A Other fracture of T11-T12 vertebra, initial encounter for closed fracture: Secondary | ICD-10-CM | POA: Diagnosis not present

## 2022-05-10 DIAGNOSIS — E1169 Type 2 diabetes mellitus with other specified complication: Secondary | ICD-10-CM | POA: Diagnosis present

## 2022-05-10 DIAGNOSIS — Z933 Colostomy status: Secondary | ICD-10-CM

## 2022-05-10 DIAGNOSIS — M159 Polyosteoarthritis, unspecified: Secondary | ICD-10-CM | POA: Diagnosis present

## 2022-05-10 DIAGNOSIS — M4316 Spondylolisthesis, lumbar region: Secondary | ICD-10-CM | POA: Diagnosis not present

## 2022-05-10 DIAGNOSIS — S32010A Wedge compression fracture of first lumbar vertebra, initial encounter for closed fracture: Secondary | ICD-10-CM | POA: Diagnosis present

## 2022-05-10 DIAGNOSIS — R319 Hematuria, unspecified: Secondary | ICD-10-CM | POA: Diagnosis not present

## 2022-05-10 DIAGNOSIS — N3289 Other specified disorders of bladder: Secondary | ICD-10-CM | POA: Diagnosis not present

## 2022-05-10 DIAGNOSIS — Z79899 Other long term (current) drug therapy: Secondary | ICD-10-CM

## 2022-05-10 DIAGNOSIS — Z83438 Family history of other disorder of lipoprotein metabolism and other lipidemia: Secondary | ICD-10-CM

## 2022-05-10 DIAGNOSIS — N1330 Unspecified hydronephrosis: Secondary | ICD-10-CM | POA: Diagnosis not present

## 2022-05-10 DIAGNOSIS — N201 Calculus of ureter: Secondary | ICD-10-CM

## 2022-05-10 DIAGNOSIS — Z7984 Long term (current) use of oral hypoglycemic drugs: Secondary | ICD-10-CM

## 2022-05-10 DIAGNOSIS — S32009A Unspecified fracture of unspecified lumbar vertebra, initial encounter for closed fracture: Secondary | ICD-10-CM | POA: Diagnosis present

## 2022-05-10 DIAGNOSIS — Z888 Allergy status to other drugs, medicaments and biological substances status: Secondary | ICD-10-CM

## 2022-05-10 DIAGNOSIS — I700 Atherosclerosis of aorta: Secondary | ICD-10-CM | POA: Diagnosis present

## 2022-05-10 DIAGNOSIS — T83511A Infection and inflammatory reaction due to indwelling urethral catheter, initial encounter: Secondary | ICD-10-CM | POA: Diagnosis not present

## 2022-05-10 DIAGNOSIS — S32018A Other fracture of first lumbar vertebra, initial encounter for closed fracture: Secondary | ICD-10-CM | POA: Diagnosis not present

## 2022-05-10 DIAGNOSIS — R310 Gross hematuria: Secondary | ICD-10-CM | POA: Diagnosis not present

## 2022-05-10 DIAGNOSIS — Z833 Family history of diabetes mellitus: Secondary | ICD-10-CM

## 2022-05-10 DIAGNOSIS — N200 Calculus of kidney: Secondary | ICD-10-CM | POA: Diagnosis not present

## 2022-05-10 DIAGNOSIS — W19XXXA Unspecified fall, initial encounter: Secondary | ICD-10-CM

## 2022-05-10 DIAGNOSIS — S32000A Wedge compression fracture of unspecified lumbar vertebra, initial encounter for closed fracture: Principal | ICD-10-CM

## 2022-05-10 DIAGNOSIS — Z8719 Personal history of other diseases of the digestive system: Secondary | ICD-10-CM

## 2022-05-10 DIAGNOSIS — E785 Hyperlipidemia, unspecified: Secondary | ICD-10-CM | POA: Diagnosis present

## 2022-05-10 DIAGNOSIS — D72829 Elevated white blood cell count, unspecified: Secondary | ICD-10-CM | POA: Diagnosis present

## 2022-05-10 DIAGNOSIS — Z7985 Long-term (current) use of injectable non-insulin antidiabetic drugs: Secondary | ICD-10-CM

## 2022-05-10 DIAGNOSIS — Z90710 Acquired absence of both cervix and uterus: Secondary | ICD-10-CM

## 2022-05-10 DIAGNOSIS — N1831 Chronic kidney disease, stage 3a: Secondary | ICD-10-CM | POA: Diagnosis not present

## 2022-05-10 DIAGNOSIS — R339 Retention of urine, unspecified: Secondary | ICD-10-CM | POA: Diagnosis not present

## 2022-05-10 DIAGNOSIS — I10 Essential (primary) hypertension: Secondary | ICD-10-CM | POA: Diagnosis not present

## 2022-05-10 DIAGNOSIS — I129 Hypertensive chronic kidney disease with stage 1 through stage 4 chronic kidney disease, or unspecified chronic kidney disease: Secondary | ICD-10-CM | POA: Diagnosis not present

## 2022-05-10 DIAGNOSIS — Y9301 Activity, walking, marching and hiking: Secondary | ICD-10-CM | POA: Diagnosis present

## 2022-05-10 DIAGNOSIS — T8383XA Hemorrhage of genitourinary prosthetic devices, implants and grafts, initial encounter: Secondary | ICD-10-CM | POA: Diagnosis present

## 2022-05-10 DIAGNOSIS — N1832 Chronic kidney disease, stage 3b: Secondary | ICD-10-CM | POA: Diagnosis present

## 2022-05-10 DIAGNOSIS — M532X6 Spinal instabilities, lumbar region: Secondary | ICD-10-CM | POA: Diagnosis not present

## 2022-05-10 DIAGNOSIS — S299XXA Unspecified injury of thorax, initial encounter: Secondary | ICD-10-CM | POA: Diagnosis not present

## 2022-05-10 DIAGNOSIS — M4324 Fusion of spine, thoracic region: Secondary | ICD-10-CM | POA: Diagnosis not present

## 2022-05-10 DIAGNOSIS — I2510 Atherosclerotic heart disease of native coronary artery without angina pectoris: Secondary | ICD-10-CM | POA: Diagnosis present

## 2022-05-10 DIAGNOSIS — Z0389 Encounter for observation for other suspected diseases and conditions ruled out: Secondary | ICD-10-CM | POA: Diagnosis not present

## 2022-05-10 DIAGNOSIS — W109XXA Fall (on) (from) unspecified stairs and steps, initial encounter: Secondary | ICD-10-CM | POA: Diagnosis present

## 2022-05-10 DIAGNOSIS — M8588 Other specified disorders of bone density and structure, other site: Secondary | ICD-10-CM | POA: Diagnosis not present

## 2022-05-10 DIAGNOSIS — Z8249 Family history of ischemic heart disease and other diseases of the circulatory system: Secondary | ICD-10-CM

## 2022-05-10 DIAGNOSIS — N179 Acute kidney failure, unspecified: Secondary | ICD-10-CM | POA: Diagnosis not present

## 2022-05-10 DIAGNOSIS — E8721 Acute metabolic acidosis: Secondary | ICD-10-CM | POA: Diagnosis present

## 2022-05-10 DIAGNOSIS — G4733 Obstructive sleep apnea (adult) (pediatric): Secondary | ICD-10-CM | POA: Diagnosis present

## 2022-05-10 DIAGNOSIS — Y846 Urinary catheterization as the cause of abnormal reaction of the patient, or of later complication, without mention of misadventure at the time of the procedure: Secondary | ICD-10-CM | POA: Diagnosis not present

## 2022-05-10 DIAGNOSIS — E871 Hypo-osmolality and hyponatremia: Secondary | ICD-10-CM | POA: Diagnosis present

## 2022-05-10 DIAGNOSIS — M62838 Other muscle spasm: Secondary | ICD-10-CM | POA: Diagnosis present

## 2022-05-10 DIAGNOSIS — E1140 Type 2 diabetes mellitus with diabetic neuropathy, unspecified: Secondary | ICD-10-CM | POA: Diagnosis present

## 2022-05-10 DIAGNOSIS — Z87442 Personal history of urinary calculi: Secondary | ICD-10-CM

## 2022-05-10 DIAGNOSIS — N9989 Other postprocedural complications and disorders of genitourinary system: Secondary | ICD-10-CM | POA: Diagnosis not present

## 2022-05-10 DIAGNOSIS — I1310 Hypertensive heart and chronic kidney disease without heart failure, with stage 1 through stage 4 chronic kidney disease, or unspecified chronic kidney disease: Secondary | ICD-10-CM | POA: Diagnosis present

## 2022-05-10 DIAGNOSIS — E1122 Type 2 diabetes mellitus with diabetic chronic kidney disease: Secondary | ICD-10-CM | POA: Diagnosis present

## 2022-05-10 DIAGNOSIS — M549 Dorsalgia, unspecified: Secondary | ICD-10-CM | POA: Diagnosis not present

## 2022-05-10 DIAGNOSIS — Y92009 Unspecified place in unspecified non-institutional (private) residence as the place of occurrence of the external cause: Secondary | ICD-10-CM | POA: Diagnosis not present

## 2022-05-10 DIAGNOSIS — E1129 Type 2 diabetes mellitus with other diabetic kidney complication: Secondary | ICD-10-CM | POA: Diagnosis present

## 2022-05-10 DIAGNOSIS — K5190 Ulcerative colitis, unspecified, without complications: Secondary | ICD-10-CM | POA: Diagnosis present

## 2022-05-10 DIAGNOSIS — L719 Rosacea, unspecified: Secondary | ICD-10-CM | POA: Diagnosis present

## 2022-05-10 DIAGNOSIS — S32019A Unspecified fracture of first lumbar vertebra, initial encounter for closed fracture: Secondary | ICD-10-CM | POA: Diagnosis not present

## 2022-05-10 DIAGNOSIS — Z8701 Personal history of pneumonia (recurrent): Secondary | ICD-10-CM

## 2022-05-10 DIAGNOSIS — S0990XA Unspecified injury of head, initial encounter: Secondary | ICD-10-CM | POA: Diagnosis not present

## 2022-05-10 DIAGNOSIS — M48061 Spinal stenosis, lumbar region without neurogenic claudication: Secondary | ICD-10-CM | POA: Diagnosis present

## 2022-05-10 DIAGNOSIS — N139 Obstructive and reflux uropathy, unspecified: Secondary | ICD-10-CM | POA: Diagnosis not present

## 2022-05-10 DIAGNOSIS — S22089A Unspecified fracture of T11-T12 vertebra, initial encounter for closed fracture: Secondary | ICD-10-CM | POA: Diagnosis present

## 2022-05-10 DIAGNOSIS — M4816 Ankylosing hyperostosis [Forestier], lumbar region: Secondary | ICD-10-CM | POA: Diagnosis present

## 2022-05-10 DIAGNOSIS — M545 Low back pain, unspecified: Secondary | ICD-10-CM | POA: Diagnosis present

## 2022-05-10 DIAGNOSIS — Z043 Encounter for examination and observation following other accident: Secondary | ICD-10-CM | POA: Diagnosis not present

## 2022-05-10 DIAGNOSIS — K219 Gastro-esophageal reflux disease without esophagitis: Secondary | ICD-10-CM | POA: Diagnosis present

## 2022-05-10 DIAGNOSIS — M546 Pain in thoracic spine: Secondary | ICD-10-CM | POA: Diagnosis not present

## 2022-05-10 DIAGNOSIS — Z882 Allergy status to sulfonamides status: Secondary | ICD-10-CM

## 2022-05-10 DIAGNOSIS — Z881 Allergy status to other antibiotic agents status: Secondary | ICD-10-CM

## 2022-05-10 DIAGNOSIS — Z9049 Acquired absence of other specified parts of digestive tract: Secondary | ICD-10-CM

## 2022-05-10 DIAGNOSIS — S32009K Unspecified fracture of unspecified lumbar vertebra, subsequent encounter for fracture with nonunion: Secondary | ICD-10-CM | POA: Diagnosis not present

## 2022-05-10 MED ORDER — OXYCODONE HCL 5 MG PO TABS
10.0000 mg | ORAL_TABLET | Freq: Once | ORAL | Status: AC
  Administered 2022-05-10: 10 mg via ORAL
  Filled 2022-05-10: qty 2

## 2022-05-10 NOTE — ED Triage Notes (Signed)
[place], fell walking down steps onto her back; no LOC; c/o mid back pain that increases with deep breathing

## 2022-05-10 NOTE — ED Triage Notes (Signed)
[place] presents [facility] from [facility] following a [place]. Pt had a mechanical fall landing on her back - endorsing mid back pain. Rating it 10/10. A&Ox4 at this time. Denies hitting her head, LOC, not on thinners, CP or SOB.

## 2022-05-10 NOTE — ED Provider Notes (Signed)
[facility] Provider Note    Event Date/Time   First MD Initiated Contact with Patient 05/10/22 2247     (approximate)   History   Fall   HPI  Jamie [name] is a 67 y.o. female with history of CKD, diabetes, hypertension and as listed in EMR presents to the emergency department for treatment and evaluation after a mechanical, nonsyncopal fall at church this evening.  Patient states that she fell while walking down some steps.  She landed directly on her back and her back hit the edge of the step.  No history of back injury or recurrent back pain.Marland Kitchen   Physical Exam   Triage Vital Signs: ED Triage Vitals  Enc Vitals Group     BP 05/10/22 2114 (!) 157/64     Pulse Rate 05/10/22 2114 86     Resp 05/10/22 2114 18     Temp 05/10/22 2114 98.2 F (36.8 C)     Temp Source 05/10/22 2114 Oral     SpO2 05/10/22 2114 100 %     Weight --      Height --      Head Circumference --      Peak Flow --      Pain Score 05/10/22 2118 10     Pain Loc --      Pain Edu? --      Excl. in GC? --     Most recent vital signs: Vitals:   05/10/22 2114  BP: (!) 157/64  Pulse: 86  Resp: 18  Temp: 98.2 F (36.8 C)  SpO2: 100%    General: Awake, no distress.  CV:  Good peripheral perfusion.  Resp:  Normal effort.  Abd:  No distention.  Other:  Midline tenderness lower thoracic and upper lumbar   ED Results / Procedures / Treatments   Labs (all labs ordered are listed, but only abnormal results are displayed) Labs Reviewed - No data to display   EKG  Not indicated   RADIOLOGY  Image and radiology report reviewed and interpreted by me. Radiology report consistent with the same.  No acute displaced fracture or traumatic listhesis of the thoracic spine.  Age-indeterminate L1 anterior wedge compression fracture.  PROCEDURES:  Critical Care performed: No  Procedures   MEDICATIONS ORDERED IN ED:  Medications - No data to display   IMPRESSION /  MDM / ASSESSMENT AND PLAN / ED COURSE   I have reviewed the triage note.  Differential diagnosis includes, but is not limited to, rib fracture, vertebral injury, musculoskeletal pain  Patient's presentation is most consistent with acute complicated illness / injury requiring diagnostic workup.  67 year old female presenting to the emergency department for treatment and evaluation after mechanical, nonsyncopal fall just prior to arrival.  See HPI for further details.  Patient has focal midline tenderness along the lower thoracic and upper lumbar spine.  X-ray shows an age-indeterminate L1 anterior wedge compression fracture.  Patient denies any history of back pain.   Plan will be to give pain medication and get CT images of the thoracic and lumbar spine for further evaluation of compression fracture.  Care relinquished to Dr. Marisa Severin at shift change. Plan to follow up on CT and likely order TLSO then discharge home.      FINAL CLINICAL IMPRESSION(S) / ED DIAGNOSES   Final diagnoses:  None  L1 vertebral fracture.   Rx / DC Orders   ED Discharge Orders     None  Note:  This document was prepared using Dragon voice recognition software and may include unintentional dictation errors.   Chinita Pester, FNP 05/12/22 1024    Georga Hacking, MD 05/17/22 [phone]

## 2022-05-10 NOTE — Progress Notes (Signed)
I, Amy L Pierron,acting as a scribe for Jamie Scotland, MD.,have documented all relevant documentation on the behalf of Jamie Scotland, MD,as directed by  Jamie Scotland, MD while in the presence of Jamie Scotland, MD.  05/10/2022 6:38 PM   [name] 11-30-1955 [id number]  Referring provider: Judy Pimple, MD [address] [place],  [place] [address]  Chief Complaint  Patient presents with   ureteral stone    HPI: 67 year-old female with a personal history of kidney stones returns today for annual follow-up.   She initially presented with a very large, obstructing left proximal ureteral stone measuring 16 millimeters, which had a very chronic type appearance. She also had acute and chronic renal insufficiency. She underwent left proximal ureteroscopy with laser lithotripsy. She removed her own stent. In follow-up.  She had a renal ultrasound that showed no residual hydronephrosis and no residual stone burden.   In the interim, she had a Creatinine on 04/04/22, which has trended back down to baseline of 1.32.    KUB today shows some very small stones in the left lower pole of the kidney, non obstructing, measuring up to five millimeters times two. No stones on the right side and no ureteral or pelvic stones appreciated .  She is doing well overall with no new stone or urinary issues.    PMH: Past Medical History:  Diagnosis Date   Anemia    Arthritis    generalized   Bowel obstruction (HCC)    repeated (? possible adhesions) neg EGD   Chronic kidney disease    Colitis    with colectomy   Diabetes mellitus without complication (HCC)    Dyspnea    covid   Gall stones 01/23/2006   Gastroenteritis 07/25/2014   hosp for IVF    GERD (gastroesophageal reflux disease)    History of kidney stones    HTN (hypertension)    Hyperglycemia    mild-monitors A1C   Obesity    Pneumonia    Rosacea    Sleep apnea    CPAP everynight    Surgical History: Past  Surgical History:  Procedure Laterality Date   CHOLECYSTECTOMY     COLECTOMY  1993   has ileostomy   COLON SURGERY  2021   removed rectum   COLONOSCOPY  08/1991   COLOSTOMY REVISION  12/06/2011   Procedure: COLOSTOMY REVISION;  Surgeon: Romie Levee, MD;  Location: [facility];  Service: General;  Laterality: Right;  OSTOMY revision   CYSTOSCOPY/URETEROSCOPY/HOLMIUM LASER/STENT PLACEMENT Left 04/11/2021   Procedure: CYSTOSCOPY/URETEROSCOPY/HOLMIUM LASER/STENT PLACEMENT;  Surgeon: Jamie Scotland, MD;  Location: [facility];  Service: Urology;  Laterality: Left;   EXAMINATION UNDER ANESTHESIA  12/06/2011   Procedure: EXAM UNDER ANESTHESIA;  Surgeon: Romie Levee, MD;  Location: [facility];  Service: General;  Laterality: N/A;  rectal exam under anesthesia, anal dilation, pouchoscopy     TOTAL ABDOMINAL HYSTERECTOMY  05/2006   for abscessed ovaries    Home Medications:  Allergies as of 05/10/2022       Reactions   Ciprofloxacin Rash   Erythema and pruritis around IV site, erythema and rash along vein   Other Rash   Jardiance [empagliflozin]    Increase in Cr    Lisinopril    REACTION: felt bad/ stomach hurt/ weak and tired   Metformin And Related    GI   Sulfa Antibiotics Rash        Medication List  Accurate as of May 10, 2022  6:38 PM. If you have any questions, ask your nurse or doctor.          STOP taking these medications    EMERGEN-C IMMUNE PO       TAKE these medications    Accu-Chek Guide test strip Generic drug: glucose blood TO CHECK GLUCOSE DAILY AND AS NEEDED FOR TYPE 2 DIABETES   Accu-Chek Guide w/Device Kit To check glucose daily and as needed for type 2 diabetes   accu-chek multiclix lancets To check glucose daily and as needed for diabetes type 2   acetaminophen 325 MG tablet Commonly known as: TYLENOL Take 650 mg by mouth every 6 (six) hours as needed for moderate pain.   albuterol 108 (90 Base)  MCG/ACT inhaler Commonly known as: VENTOLIN HFA Inhale 2 puffs into the lungs every 4 (four) hours as needed for wheezing or shortness of breath.   atorvastatin 10 MG tablet Commonly known as: LIPITOR TAKE 1/2 PILL BY MOUTH EVERY OTHER DAY   esomeprazole 20 MG capsule Commonly known as: NEXIUM Take 20 mg by mouth every other day.   fluticasone 50 MCG/ACT nasal spray Commonly known as: FLONASE Place 2 sprays into both nostrils at bedtime.   glipiZIDE 5 MG tablet Commonly known as: GLUCOTROL Take 1 tablet (5 mg total) by mouth daily before breakfast.   hyoscyamine 0.125 MG SL tablet Commonly known as: LEVSIN SL Take 1 tablet (0.125 mg total) by mouth every 4 (four) hours as needed. For abdominal cramps   losartan 25 MG tablet Commonly known as: COZAAR Take 25 mg by mouth daily.   melatonin 1 MG Tabs tablet Take 1.5 mg by mouth at bedtime.   metoprolol tartrate 25 MG tablet Commonly known as: LOPRESSOR TAKE 1 TABLET BY MOUTH EVERY DAY   multivitamin tablet Take 1 tablet by mouth daily.   NON FORMULARY SLEEPS WITH BI-PAP   tirzepatide 5 MG/0.5ML Pen Commonly known as: MOUNJARO Inject 5 mg into the skin once a week.   Vitamin D3 25 MCG (1000 UT) Caps Take 1,000 Units by mouth daily.   zolpidem 10 MG tablet Commonly known as: AMBIEN Take 0.5-1 tablets (5-10 mg total) by mouth at bedtime as needed. for sleep        Allergies:  Allergies  Allergen Reactions   Ciprofloxacin Rash    Erythema and pruritis around IV site, erythema and rash along vein   Other Rash   Jardiance [Empagliflozin]     Increase in Cr    Lisinopril     REACTION: felt bad/ stomach hurt/ weak and tired   Metformin And Related     GI   Sulfa Antibiotics Rash    Family History: Family History  Problem Relation Age of Onset   Hypertension Mother    Hyperlipidemia Mother    Cirrhosis Mother        NASH   Diabetes Mother    Liver cancer Father        resection secondary to mets    Cancer Father        colon   Hyperlipidemia Father    Hypertension Father    Allergies Father    Ulcerative colitis Father    Allergies Brother    Gastric cancer Brother    Breast cancer Paternal Grandmother    Breast cancer Cousin 35       paternal cousins    Social History:  reports that she has never smoked. She has never  used smokeless tobacco. She reports that she does not drink alcohol and does not use drugs.   Physical Exam: BP 136/71   Pulse 80   Ht 5' 5.5" (1.664 m)   Wt 265 lb (120.2 kg)   BMI 43.43 kg/m   Constitutional:  Alert and oriented, No acute distress. HEENT: [facility] AT, moist mucus membranes.  Trachea midline, no masses. Neurologic: Grossly intact, no focal deficits, moving all 4 extremities. Psychiatric: Normal mood and affect.   Pertinent Imaging: KUB performed today. Images personally reviewed and awaiting final radiologic interpretation. See HPI   Assessment & Plan:    Left nephrolithiasis  - Non-obstructing stones. Unclear whether they are residual vs new stones.  - Asymptomatic.  - Recheck a KUB next year to help determine whether she's metabotically an active stone former. If there is significant interval growth could consider checking her urine and metabolic evaluation.  - Discussed stone dietary recommendations again today, encouraged hydration, etc.  Return in about 1 year (around 05/10/2023) for KUB.  I have reviewed the above documentation for accuracy and completeness, and I agree with the above.   Jamie Scotland, MD   [facility] [address] [place], [place] [address] [phone]

## 2022-05-11 ENCOUNTER — Other Ambulatory Visit: Payer: Self-pay

## 2022-05-11 ENCOUNTER — Observation Stay: Payer: Medicare PPO

## 2022-05-11 DIAGNOSIS — S32018A Other fracture of first lumbar vertebra, initial encounter for closed fracture: Secondary | ICD-10-CM

## 2022-05-11 DIAGNOSIS — M532X6 Spinal instabilities, lumbar region: Secondary | ICD-10-CM

## 2022-05-11 DIAGNOSIS — E8721 Acute metabolic acidosis: Secondary | ICD-10-CM | POA: Diagnosis present

## 2022-05-11 DIAGNOSIS — Z6841 Body Mass Index (BMI) 40.0 and over, adult: Secondary | ICD-10-CM | POA: Diagnosis not present

## 2022-05-11 DIAGNOSIS — G4733 Obstructive sleep apnea (adult) (pediatric): Secondary | ICD-10-CM | POA: Diagnosis present

## 2022-05-11 DIAGNOSIS — S32010A Wedge compression fracture of first lumbar vertebra, initial encounter for closed fracture: Principal | ICD-10-CM

## 2022-05-11 DIAGNOSIS — I2510 Atherosclerotic heart disease of native coronary artery without angina pectoris: Secondary | ICD-10-CM | POA: Diagnosis present

## 2022-05-11 DIAGNOSIS — W109XXA Fall (on) (from) unspecified stairs and steps, initial encounter: Secondary | ICD-10-CM | POA: Diagnosis present

## 2022-05-11 DIAGNOSIS — E785 Hyperlipidemia, unspecified: Secondary | ICD-10-CM | POA: Diagnosis present

## 2022-05-11 DIAGNOSIS — Z933 Colostomy status: Secondary | ICD-10-CM | POA: Diagnosis not present

## 2022-05-11 DIAGNOSIS — E1140 Type 2 diabetes mellitus with diabetic neuropathy, unspecified: Secondary | ICD-10-CM | POA: Diagnosis present

## 2022-05-11 DIAGNOSIS — D72829 Elevated white blood cell count, unspecified: Secondary | ICD-10-CM | POA: Diagnosis present

## 2022-05-11 DIAGNOSIS — Y9301 Activity, walking, marching and hiking: Secondary | ICD-10-CM | POA: Diagnosis present

## 2022-05-11 DIAGNOSIS — W19XXXA Unspecified fall, initial encounter: Secondary | ICD-10-CM | POA: Diagnosis not present

## 2022-05-11 DIAGNOSIS — R339 Retention of urine, unspecified: Secondary | ICD-10-CM | POA: Diagnosis not present

## 2022-05-11 DIAGNOSIS — N1832 Chronic kidney disease, stage 3b: Secondary | ICD-10-CM | POA: Diagnosis present

## 2022-05-11 DIAGNOSIS — S22088A Other fracture of T11-T12 vertebra, initial encounter for closed fracture: Secondary | ICD-10-CM

## 2022-05-11 DIAGNOSIS — S22089A Unspecified fracture of T11-T12 vertebra, initial encounter for closed fracture: Secondary | ICD-10-CM | POA: Insufficient documentation

## 2022-05-11 DIAGNOSIS — K5190 Ulcerative colitis, unspecified, without complications: Secondary | ICD-10-CM | POA: Diagnosis present

## 2022-05-11 DIAGNOSIS — Y92009 Unspecified place in unspecified non-institutional (private) residence as the place of occurrence of the external cause: Secondary | ICD-10-CM

## 2022-05-11 DIAGNOSIS — E1169 Type 2 diabetes mellitus with other specified complication: Secondary | ICD-10-CM | POA: Diagnosis present

## 2022-05-11 DIAGNOSIS — Y9222 Religious institution as the place of occurrence of the external cause: Secondary | ICD-10-CM | POA: Diagnosis not present

## 2022-05-11 DIAGNOSIS — N136 Pyonephrosis: Secondary | ICD-10-CM | POA: Diagnosis not present

## 2022-05-11 DIAGNOSIS — Y846 Urinary catheterization as the cause of abnormal reaction of the patient, or of later complication, without mention of misadventure at the time of the procedure: Secondary | ICD-10-CM | POA: Diagnosis not present

## 2022-05-11 DIAGNOSIS — T8383XA Hemorrhage of genitourinary prosthetic devices, implants and grafts, initial encounter: Secondary | ICD-10-CM | POA: Diagnosis present

## 2022-05-11 DIAGNOSIS — T83511A Infection and inflammatory reaction due to indwelling urethral catheter, initial encounter: Secondary | ICD-10-CM | POA: Diagnosis not present

## 2022-05-11 DIAGNOSIS — E1122 Type 2 diabetes mellitus with diabetic chronic kidney disease: Secondary | ICD-10-CM | POA: Diagnosis present

## 2022-05-11 DIAGNOSIS — S32019A Unspecified fracture of first lumbar vertebra, initial encounter for closed fracture: Secondary | ICD-10-CM | POA: Insufficient documentation

## 2022-05-11 DIAGNOSIS — M545 Low back pain, unspecified: Secondary | ICD-10-CM | POA: Diagnosis present

## 2022-05-11 DIAGNOSIS — N1831 Chronic kidney disease, stage 3a: Secondary | ICD-10-CM | POA: Diagnosis not present

## 2022-05-11 DIAGNOSIS — R310 Gross hematuria: Secondary | ICD-10-CM | POA: Diagnosis not present

## 2022-05-11 DIAGNOSIS — S32009K Unspecified fracture of unspecified lumbar vertebra, subsequent encounter for fracture with nonunion: Secondary | ICD-10-CM | POA: Diagnosis not present

## 2022-05-11 DIAGNOSIS — I1310 Hypertensive heart and chronic kidney disease without heart failure, with stage 1 through stage 4 chronic kidney disease, or unspecified chronic kidney disease: Secondary | ICD-10-CM | POA: Diagnosis present

## 2022-05-11 DIAGNOSIS — Z8249 Family history of ischemic heart disease and other diseases of the circulatory system: Secondary | ICD-10-CM | POA: Diagnosis not present

## 2022-05-11 DIAGNOSIS — S0990XA Unspecified injury of head, initial encounter: Secondary | ICD-10-CM | POA: Diagnosis not present

## 2022-05-11 DIAGNOSIS — I700 Atherosclerosis of aorta: Secondary | ICD-10-CM | POA: Diagnosis present

## 2022-05-11 DIAGNOSIS — N179 Acute kidney failure, unspecified: Secondary | ICD-10-CM | POA: Diagnosis present

## 2022-05-11 DIAGNOSIS — I10 Essential (primary) hypertension: Secondary | ICD-10-CM | POA: Diagnosis not present

## 2022-05-11 DIAGNOSIS — N9989 Other postprocedural complications and disorders of genitourinary system: Secondary | ICD-10-CM | POA: Diagnosis not present

## 2022-05-11 DIAGNOSIS — Z7984 Long term (current) use of oral hypoglycemic drugs: Secondary | ICD-10-CM | POA: Diagnosis not present

## 2022-05-11 DIAGNOSIS — S299XXA Unspecified injury of thorax, initial encounter: Secondary | ICD-10-CM | POA: Diagnosis not present

## 2022-05-11 DIAGNOSIS — E871 Hypo-osmolality and hyponatremia: Secondary | ICD-10-CM | POA: Diagnosis present

## 2022-05-11 DIAGNOSIS — Z043 Encounter for examination and observation following other accident: Secondary | ICD-10-CM | POA: Diagnosis not present

## 2022-05-11 LAB — BASIC METABOLIC PANEL
Anion gap: 10 (ref 5–15)
BUN: 23 mg/dL (ref 8–23)
CO2: 20 mmol/L — ABNORMAL LOW (ref 22–32)
Calcium: 9.2 mg/dL (ref 8.9–10.3)
Chloride: 103 mmol/L (ref 98–111)
Creatinine, Ser: 1.82 mg/dL — ABNORMAL HIGH (ref 0.44–1.00)
GFR, Estimated: 30 mL/min — ABNORMAL LOW (ref >60)
Glucose, Bld: 167 mg/dL — ABNORMAL HIGH (ref 70–99)
Potassium: 4.6 mmol/L (ref 3.5–5.1)
Sodium: 133 mmol/L — ABNORMAL LOW (ref 135–145)

## 2022-05-11 LAB — TYPE AND SCREEN: Unit division: 0

## 2022-05-11 LAB — PROTIME-INR
INR: 1.1 (ref 0.8–1.2)
Prothrombin Time: 14.5 seconds (ref 11.4–15.2)

## 2022-05-11 LAB — BPAM RBC
Blood Product Expiration Date: 202405162359
Unit Type and Rh: 5100
Unit Type and Rh: 5100

## 2022-05-11 LAB — CBC
HCT: 42.2 % (ref 36.0–46.0)
Hemoglobin: 13.8 g/dL (ref 12.0–15.0)
MCH: 31.9 pg (ref 26.0–34.0)
MCHC: 32.7 g/dL (ref 30.0–36.0)
MCV: 97.5 fL (ref 80.0–100.0)
Platelets: 223 10*3/uL (ref 150–400)
RBC: 4.33 MIL/uL (ref 3.87–5.11)
RDW: 13.4 % (ref 11.5–15.5)
WBC: 13.5 10*3/uL — ABNORMAL HIGH (ref 4.0–10.5)
nRBC: 0 % (ref 0.0–0.2)

## 2022-05-11 LAB — CBG MONITORING, ED
Glucose-Capillary: 150 mg/dL — ABNORMAL HIGH (ref 70–99)
Glucose-Capillary: 153 mg/dL — ABNORMAL HIGH (ref 70–99)

## 2022-05-11 LAB — GLUCOSE, CAPILLARY: Glucose-Capillary: 134 mg/dL — ABNORMAL HIGH (ref 70–99)

## 2022-05-11 LAB — APTT: aPTT: 24 seconds (ref 24–36)

## 2022-05-11 LAB — ABO/RH: ABO/RH(D): A POS

## 2022-05-11 LAB — HIV ANTIBODY (ROUTINE TESTING W REFLEX): HIV Screen 4th Generation wRfx: NONREACTIVE

## 2022-05-11 MED ORDER — MORPHINE SULFATE (PF) 2 MG/ML IV SOLN
2.0000 mg | INTRAVENOUS | Status: DC | PRN
  Administered 2022-05-11 – 2022-05-14 (×6): 2 mg via INTRAVENOUS
  Filled 2022-05-11 (×6): qty 1

## 2022-05-11 MED ORDER — INSULIN ASPART 100 UNIT/ML IJ SOLN
0.0000 [IU] | Freq: Three times a day (TID) | INTRAMUSCULAR | Status: DC
  Administered 2022-05-11: 2 [IU] via SUBCUTANEOUS
  Administered 2022-05-11: 1 [IU] via SUBCUTANEOUS
  Administered 2022-05-12 – 2022-05-13 (×3): 3 [IU] via SUBCUTANEOUS
  Administered 2022-05-13 (×2): 2 [IU] via SUBCUTANEOUS
  Administered 2022-05-14 (×3): 3 [IU] via SUBCUTANEOUS
  Administered 2022-05-15: 5 [IU] via SUBCUTANEOUS
  Administered 2022-05-15 (×2): 3 [IU] via SUBCUTANEOUS
  Administered 2022-05-16: 5 [IU] via SUBCUTANEOUS
  Administered 2022-05-16 – 2022-05-17 (×5): 3 [IU] via SUBCUTANEOUS
  Administered 2022-05-18 (×2): 5 [IU] via SUBCUTANEOUS
  Administered 2022-05-18: 2 [IU] via SUBCUTANEOUS
  Administered 2022-05-19: 7 [IU] via SUBCUTANEOUS
  Administered 2022-05-19: 2 [IU] via SUBCUTANEOUS
  Administered 2022-05-19 – 2022-05-20 (×2): 3 [IU] via SUBCUTANEOUS
  Administered 2022-05-20: 5 [IU] via SUBCUTANEOUS
  Administered 2022-05-20: 3 [IU] via SUBCUTANEOUS
  Administered 2022-05-21 (×2): 2 [IU] via SUBCUTANEOUS
  Administered 2022-05-21: 7 [IU] via SUBCUTANEOUS
  Administered 2022-05-22: 5 [IU] via SUBCUTANEOUS
  Administered 2022-05-22: 1 [IU] via SUBCUTANEOUS
  Administered 2022-05-22: 2 [IU] via SUBCUTANEOUS
  Administered 2022-05-23 (×2): 1 [IU] via SUBCUTANEOUS
  Administered 2022-05-23: 5 [IU] via SUBCUTANEOUS
  Administered 2022-05-24: 2 [IU] via SUBCUTANEOUS
  Filled 2022-05-11 (×37): qty 1

## 2022-05-11 MED ORDER — CYCLOBENZAPRINE HCL 10 MG PO TABS
10.0000 mg | ORAL_TABLET | Freq: Once | ORAL | Status: AC
  Administered 2022-05-11: 10 mg via ORAL
  Filled 2022-05-11: qty 1

## 2022-05-11 MED ORDER — ONDANSETRON HCL 4 MG/2ML IJ SOLN: 4.0000 mg | Freq: Three times a day (TID) | INTRAMUSCULAR | Status: DC | PRN

## 2022-05-11 MED ORDER — ALBUTEROL SULFATE (2.5 MG/3ML) 0.083% IN NEBU: 3.0000 mL | INHALATION_SOLUTION | RESPIRATORY_TRACT | Status: DC | PRN

## 2022-05-11 MED ORDER — OXYCODONE-ACETAMINOPHEN 5-325 MG PO TABS: 1.0000 | ORAL_TABLET | Freq: Four times a day (QID) | ORAL | 0 refills | Status: AC | PRN

## 2022-05-11 MED ORDER — ATORVASTATIN CALCIUM 10 MG PO TABS
5.0000 mg | ORAL_TABLET | ORAL | Status: DC
  Administered 2022-05-12 – 2022-05-24 (×7): 5 mg via ORAL
  Filled 2022-05-11 (×8): qty 1

## 2022-05-11 MED ORDER — HYDRALAZINE HCL 20 MG/ML IJ SOLN: 5.0000 mg | INTRAMUSCULAR | Status: DC | PRN

## 2022-05-11 MED ORDER — HEPARIN SODIUM (PORCINE) 5000 UNIT/ML IJ SOLN
5000.0000 [IU] | Freq: Three times a day (TID) | INTRAMUSCULAR | Status: DC
  Administered 2022-05-11 – 2022-05-12 (×3): 5000 [IU] via SUBCUTANEOUS
  Filled 2022-05-11 (×3): qty 1

## 2022-05-11 MED ORDER — VITAMIN D 25 MCG (1000 UNIT) PO TABS
1000.0000 [IU] | ORAL_TABLET | Freq: Every day | ORAL | Status: DC
  Administered 2022-05-11 – 2022-05-24 (×14): 1000 [IU] via ORAL
  Filled 2022-05-11 (×14): qty 1

## 2022-05-11 MED ORDER — METHOCARBAMOL 500 MG PO TABS
500.0000 mg | ORAL_TABLET | Freq: Three times a day (TID) | ORAL | Status: DC | PRN
  Administered 2022-05-11 – 2022-05-19 (×4): 500 mg via ORAL
  Filled 2022-05-11 (×5): qty 1

## 2022-05-11 MED ORDER — METOPROLOL TARTRATE 25 MG PO TABS
25.0000 mg | ORAL_TABLET | Freq: Every day | ORAL | Status: DC
  Administered 2022-05-11 – 2022-05-24 (×14): 25 mg via ORAL
  Filled 2022-05-11 (×14): qty 1

## 2022-05-11 MED ORDER — OXYCODONE-ACETAMINOPHEN 5-325 MG PO TABS
1.0000 | ORAL_TABLET | ORAL | Status: DC | PRN
  Administered 2022-05-11 – 2022-05-20 (×25): 1 via ORAL
  Filled 2022-05-11 (×26): qty 1

## 2022-05-11 MED ORDER — LOSARTAN POTASSIUM 50 MG PO TABS
25.0000 mg | ORAL_TABLET | Freq: Every day | ORAL | Status: DC
  Administered 2022-05-11: 25 mg via ORAL
  Filled 2022-05-11: qty 1

## 2022-05-11 MED ORDER — FLUTICASONE PROPIONATE 50 MCG/ACT NA SUSP
2.0000 | Freq: Every day | NASAL | Status: DC
  Administered 2022-05-12 – 2022-05-23 (×12): 2 via NASAL
  Filled 2022-05-11: qty 16

## 2022-05-11 MED ORDER — DM-GUAIFENESIN ER 30-600 MG PO TB12: 1.0000 | ORAL_TABLET | Freq: Two times a day (BID) | ORAL | Status: DC | PRN

## 2022-05-11 MED ORDER — INSULIN ASPART 100 UNIT/ML IJ SOLN
0.0000 [IU] | Freq: Every day | INTRAMUSCULAR | Status: DC
  Administered 2022-05-13 – 2022-05-17 (×4): 2 [IU] via SUBCUTANEOUS
  Administered 2022-05-18 – 2022-05-19 (×2): 3 [IU] via SUBCUTANEOUS
  Filled 2022-05-11 (×6): qty 1

## 2022-05-11 MED ORDER — ACETAMINOPHEN 325 MG PO TABS
650.0000 mg | ORAL_TABLET | Freq: Four times a day (QID) | ORAL | Status: DC | PRN
  Administered 2022-05-21 – 2022-05-24 (×6): 650 mg via ORAL
  Filled 2022-05-11 (×6): qty 2

## 2022-05-11 MED ORDER — HYDROMORPHONE HCL 1 MG/ML IJ SOLN
0.5000 mg | Freq: Once | INTRAMUSCULAR | Status: AC
  Administered 2022-05-11: 0.5 mg via INTRAMUSCULAR
  Filled 2022-05-11: qty 0.5

## 2022-05-11 MED ORDER — ADULT MULTIVITAMIN W/MINERALS CH
1.0000 | ORAL_TABLET | Freq: Every day | ORAL | Status: DC
  Administered 2022-05-11 – 2022-05-24 (×14): 1 via ORAL
  Filled 2022-05-11 (×14): qty 1

## 2022-05-11 MED ORDER — OXYCODONE HCL 5 MG PO TABS
5.0000 mg | ORAL_TABLET | Freq: Once | ORAL | Status: AC
  Administered 2022-05-11: 5 mg via ORAL
  Filled 2022-05-11: qty 1

## 2022-05-11 MED ORDER — PANTOPRAZOLE SODIUM 40 MG PO TBEC
40.0000 mg | DELAYED_RELEASE_TABLET | Freq: Every day | ORAL | Status: DC
  Administered 2022-05-12 – 2022-05-14 (×3): 40 mg via ORAL
  Filled 2022-05-11 (×4): qty 1

## 2022-05-11 MED ORDER — HYDROMORPHONE HCL 1 MG/ML IJ SOLN: 0.5000 mg | Freq: Once | INTRAMUSCULAR | Status: DC

## 2022-05-11 MED ORDER — MELATONIN 5 MG PO TABS
2.5000 mg | ORAL_TABLET | Freq: Every evening | ORAL | Status: DC | PRN
  Administered 2022-05-14 – 2022-05-20 (×4): 2.5 mg via ORAL
  Filled 2022-05-11 (×5): qty 1

## 2022-05-11 MED ORDER — LIDOCAINE 5 % EX PTCH
1.0000 | MEDICATED_PATCH | CUTANEOUS | Status: DC
  Filled 2022-05-11 (×6): qty 1

## 2022-05-11 MED ORDER — SODIUM CHLORIDE 0.9 % IV SOLN: INTRAVENOUS | Status: AC

## 2022-05-11 NOTE — ED Provider Notes (Signed)
8:25 AM Assumed care for off going team.   Blood pressure (!) 157/64, pulse 86, temperature 98.2 F (36.8 C), temperature source Oral, resp. rate 18, SpO2 100 %.  See their HPI for full report but in brief pending TLSO brace  Patient TLSO brace was placed.  Patient reporting severe pain difficulty ambulating she was given another dose of oxycodone, Dilaudid to try to help facilitate ambulation discussed with patient she denies any significant weakness in 1 day leg, numbness, tingling, urinary, rectal incontinence, saddle anesthesia to suggest cord compression.  Discussed with her that she is unable to ambulate that we could admit for pain control, placement.  Patient still unable to ambulate  Discussed with  [facility]-who ordered MRIs.  Will discuss with hospital team for admission        Concha Se, MD 05/11/22 [phone]

## 2022-05-11 NOTE — ED Notes (Signed)
This SN and Primary RN Museum/gallery curator) into pt's room to ambulate to toilet in room. Pt's brace in place. Pt to sitting position on side of bed using log roll technique. Pt ambulates with moderate assistance to toilet. Pt also emptied ostomy bag. Pt returned to bed. Pt reports severe pain with movement but was successful in ambulating.

## 2022-05-11 NOTE — ED Notes (Addendum)
error 

## 2022-05-11 NOTE — ED Provider Notes (Signed)
-----------------------------------------   6:48 AM on 05/11/2022 -----------------------------------------  I took over care of this patient from the APP.  CTs show fractures of L1 and T12 with no retropulsion.  IMPRESSION:  1. Osteopenia and degenerative change thoracic spine without  evidence of thoracic spine fractures or malalignment.  2. Cardiomegaly with aortic and coronary artery atherosclerosis.  3. Acute superior endplate anterior wedge compression fracture of L1  with 25% loss of anterior height but no posterior cortical  retropulsion or paraspinal hematoma.  4. Transverse nondisplaced fracture through the superior L1 body  extends through both pedicles and dorsally through the superior  articular facet of L1 and on the right also through the inferior  articular facet of T12, with ankylosis of the facet joints seen  previously. The posterior fractures are also nondisplaced.  5. Osteopenia and degenerative change without further appreciable  lumbar spine fractures.  6. Nonobstructive left nephrolithiasis.  7. Cluster of extruded gallstones in Morison's pouch. Status post  cholecystectomy.   I consulted Dr. Myer Haff from neurosurgery who reviewed the imaging.  He recommends a TLSO brace and outpatient follow-up.  We have ordered the TLSO brace, however the technician has not yet arrived to apply it.  Once this is done, the patient will be appropriate for discharge home.   Dionne Bucy, MD 05/11/22 [phone]

## 2022-05-11 NOTE — Discharge Instructions (Addendum)
  Your surgeon has performed an operation on your lumbar spine (low back) to relieve pressure on one or more nerves. Many times, patients feel better immediately after surgery and can "overdo it." Even if you feel well, it is important that you follow these activity guidelines. If you do not let your back heal properly from the surgery, you can increase the chance of hardware complications and/or return of your symptoms. The following are instructions to help in your recovery once you have been discharged from the hospital.  Do not use NSAIDs for 3 months after surgery.   Activity    No bending, lifting, or twisting ("BLT"). Avoid lifting objects heavier than 10 pounds (gallon milk jug).  Where possible, avoid household activities that involve lifting, bending, pushing, or pulling such as laundry, vacuuming, grocery shopping, and childcare. Try to arrange for help from friends and family for these activities while your back heals.  Increase physical activity slowly as tolerated.  Taking short walks is encouraged, but avoid strenuous exercise. Do not jog, run, bicycle, lift weights, or participate in any other exercises unless specifically allowed by your doctor. Avoid prolonged sitting, including car rides.  Talk to your doctor before resuming sexual activity.  You should not drive until cleared by your doctor.  Until released by your doctor, you should not return to work or school.  You should rest at home and let your body heal.   You may shower three days after your surgery.  After showering, lightly dab your incision dry. Do not take a tub bath or go swimming for 3 weeks, or until approved by your doctor at your follow-up appointment.  If you smoke, we strongly recommend that you quit.  Smoking has been proven to interfere with normal healing in your back and will dramatically reduce the success rate of your surgery. Please contact [facility] (800-QUIT-NOW) and use the resources at  www.[facility].com for assistance in stopping smoking.  Surgical Incision   If you have a dressing on your incision, you may remove it three days after your surgery. Keep your incision area clean and dry.  Your incision was closed with Dermabond glue. The glue should begin to peel away within about a week.  Diet            You may return to your usual diet. Be sure to stay hydrated.  When to Contact Us  Although your surgery and recovery will likely be uneventful, you may have some residual numbness, aches, and pains in your back and/or legs. This is normal and should improve in the next few weeks.  However, should you experience any of the following, contact us immediately: New numbness or weakness Pain that is progressively getting worse, and is not relieved by your pain medications or rest Bleeding, redness, swelling, pain, or drainage from surgical incision Chills or flu-like symptoms Fever greater than 101.0 F (38.3 C) Problems with bowel or bladder functions Difficulty breathing or shortness of breath Warmth, tenderness, or swelling in your calf  Contact Information During office hours (Monday-Friday 9 am to 5 pm), please call your physician at [phone] and ask for Kendelyn Jean After hours and weekends, please call [phone] and speak with the neurosurgeon on call For a life-threatening emergency, call 911 

## 2022-05-11 NOTE — H&P (View-Only) (Signed)
Consult requested by:  Dr. Marisa Severin  Consult requested for:  L1 fracture  Primary Physician:  Tower, Audrie Gallus, MD  History of Present Illness: 05/11/2022 Jamie [name] is here today with a chief complaint of back pain after a fall.  She was walking down the stairs at church last evening when she had a mechanical fall.  She hit her in the middle of her back on the bottom stair and felt immediate severe pain.  She presented to the emergency department due to severe pain.  Radiographic workup revealed a significant T12 and L1 fracture.  She was unable to ambulate significantly in a TLSO brace, prompting further imaging.  I have utilized the care everywhere function in epic to review the outside records available from external health systems.  Review of Systems:  A 10 point review of systems is negative, except for the pertinent positives and negatives detailed in the HPI.  Past Medical History: Past Medical History:  Diagnosis Date   Anemia    Arthritis    generalized   Bowel obstruction    repeated (? possible adhesions) neg EGD   Chronic kidney disease    Colitis    with colectomy   Diabetes mellitus without complication    Dyspnea    covid   Gall stones 01/23/2006   Gastroenteritis 07/25/2014   hosp for IVF    GERD (gastroesophageal reflux disease)    History of kidney stones    HTN (hypertension)    Hyperglycemia    mild-monitors A1C   Obesity    Pneumonia    Rosacea    Sleep apnea    CPAP everynight    Past Surgical History: Past Surgical History:  Procedure Laterality Date   CHOLECYSTECTOMY     COLECTOMY  1993   has ileostomy   COLON SURGERY  2021   removed rectum   COLONOSCOPY  08/1991   COLOSTOMY REVISION  12/06/2011   Procedure: COLOSTOMY REVISION;  Surgeon: Romie Levee, MD;  Location: [facility] [place];  Service: General;  Laterality: Right;  OSTOMY revision   CYSTOSCOPY/URETEROSCOPY/HOLMIUM LASER/STENT PLACEMENT Left 04/11/2021    Procedure: CYSTOSCOPY/URETEROSCOPY/HOLMIUM LASER/STENT PLACEMENT;  Surgeon: Vanna Scotland, MD;  Location: [facility];  Service: Urology;  Laterality: Left;   EXAMINATION UNDER ANESTHESIA  12/06/2011   Procedure: EXAM UNDER ANESTHESIA;  Surgeon: Romie Levee, MD;  Location: [facility];  Service: General;  Laterality: N/A;  rectal exam under anesthesia, anal dilation, pouchoscopy     TOTAL ABDOMINAL HYSTERECTOMY  05/2006   for abscessed ovaries    Allergies: Allergies as of 05/10/2022 - Review Complete 05/10/2022  Allergen Reaction Noted   Ciprofloxacin Rash 10/02/2012   Other Rash 07/13/2021   Jardiance [empagliflozin]  02/13/2020   Lisinopril  07/30/2009   Metformin and related  05/14/2019   Sulfa antibiotics Rash 11/08/2014    Medications: Current Meds  Medication Sig   acetaminophen (TYLENOL) 325 MG tablet Take 650 mg by mouth every 6 (six) hours as needed for moderate pain.   atorvastatin (LIPITOR) 10 MG tablet TAKE 1/2 PILL BY MOUTH EVERY OTHER DAY (Patient taking differently: Take 5 mg by mouth every other day.)   Cholecalciferol (VITAMIN D3) 25 MCG (1000 UT) CAPS Take 1,000 Units by mouth daily.   esomeprazole (NEXIUM) 20 MG capsule Take 20 mg by mouth every other day.   fluticasone (FLONASE) 50 MCG/ACT nasal spray Place 2 sprays into both nostrils at bedtime.   glipiZIDE (GLUCOTROL) 5 MG tablet Take  1 tablet (5 mg total) by mouth daily before breakfast.   losartan (COZAAR) 25 MG tablet Take 25 mg by mouth daily.   melatonin 1 MG TABS tablet Take 1.5 mg by mouth at bedtime.   Multiple Vitamin (MULTIVITAMIN) tablet Take 1 tablet by mouth daily.   oxyCODONE-acetaminophen (PERCOCET) 5-325 MG tablet Take 1 tablet by mouth every 6 (six) hours as needed for up to 7 days for severe pain.   zolpidem (AMBIEN) 10 MG tablet Take 0.5-1 tablets (5-10 mg total) by mouth at bedtime as needed. for sleep    Social History: Social History   Tobacco Use   Smoking status:  Never   Smokeless tobacco: Never  Vaping Use   Vaping Use: Never used  Substance Use Topics   Alcohol use: No    Alcohol/week: 0.0 standard drinks of alcohol   Drug use: No    Family Medical History: Family History  Problem Relation Age of Onset   Hypertension Mother    Hyperlipidemia Mother    Cirrhosis Mother        NASH   Diabetes Mother    Liver cancer Father        resection secondary to mets   Cancer Father        colon   Hyperlipidemia Father    Hypertension Father    Allergies Father    Ulcerative colitis Father    Allergies Brother    Gastric cancer Brother    Breast cancer Paternal Grandmother    Breast cancer Cousin 35       paternal cousins    Physical Examination: Vitals:   05/10/22 2114 05/11/22 0924  BP: (!) 157/64 (!) 148/47  Pulse: 86 100  Resp: 18 14  Temp: 98.2 F (36.8 C) 97.6 F (36.4 C)  SpO2: 100% 97%    General: Patient is well developed, well nourished, calm, collected, but in pain with any movement. Attention to examination is appropriate.  Neck:   Supple.  Full range of motion.  Respiratory: Patient is breathing without any difficulty.   NEUROLOGICAL:     Awake, alert, oriented to person, place, and time.  Speech is clear and fluent.  Cranial Nerves: Pupils equal round and reactive to light.  Facial tone is symmetric.  Facial sensation is symmetric. Tongue protrusion is midline.      Strength: Side Biceps Triceps Deltoid Interossei Grip Wrist Ext. Wrist Flex.  R L Side Iliopsoas Quads Hamstring PF DF EHL  R L Reflexes are 1+ and symmetric at the biceps, triceps, brachioradialis, patella and achilles.   Hoffman's is absent.   Bilateral upper and lower extremity sensation is intact to light touch.    No evidence of dysmetria noted.  Gait is untested.     Medical Decision Making  Imaging: MR TL spine 05/11/2022 IMPRESSION: 1. Acute nondisplaced transverse  fracture through the superior aspect of the L1 vertebral body and posterior elements. 2. Multilevel lumbar disc and facet degeneration, most notable at L4-5 where there is moderate spinal stenosis.     Electronically Signed   By: Sebastian Ache M.D.   On: 05/11/2022 11:34  CT TL spine 05/11/2022 IMPRESSION: 1. Osteopenia and degenerative change thoracic spine without evidence of thoracic spine fractures or malalignment. 2. Cardiomegaly with aortic and coronary artery atherosclerosis. 3. Acute  superior endplate anterior wedge compression fracture of L1 with 25% loss of anterior height but no posterior cortical retropulsion or paraspinal hematoma. 4. Transverse nondisplaced fracture through the superior L1 body extends through both pedicles and dorsally through the superior articular facet of L1 and on the right also through the inferior articular facet of T12, with ankylosis of the facet joints seen previously. The posterior fractures are also nondisplaced. 5. Osteopenia and degenerative change without further appreciable lumbar spine fractures. 6. Nonobstructive left nephrolithiasis. 7. Cluster of extruded gallstones in Morison's pouch. Status post cholecystectomy.   Aortic Atherosclerosis (ICD10-I70.0).     Electronically Signed   By: Almira Bar M.D.   On: 05/11/2022 00:09    I have personally reviewed the images and agree with the above interpretation.  Assessment and Plan: Jamie [name] is a pleasant 67 y.o. female with unstable L1 3 column fracture.  This is a chance type fracture.  She also has some involvement at T12, but the primary focus of her fracture is at L1.  This fracture is secondary to an underlying condition of diffuse idiopathic skeletal hyperostosis.  Due to the nature of this fracture, conservative management with bracing is not the most appropriate treatment.  I recommended fixation given the instability of the 3 column fracture.  This would involve  T11-L3 fixation to allow the L1 fracture and T12 fractures to heal.  I discussed the planned procedure at length with the patient, including the risks, benefits, alternatives, and indications. The risks discussed include but are not limited to bleeding, infection, need for reoperation, spinal fluid leak, stroke, vision loss, anesthetic complication, coma, paralysis, and even death. I also described in detail that improvement was not guaranteed.  The patient expressed understanding of these risks, and asked that we proceed with surgery. I described the surgery in layman's terms, and gave ample opportunity for questions, which were answered to the best of my ability.    I have communicated my recommendations to the requesting physician and coordinated care to facilitate these recommendations.     Houda Brau K. Myer Haff MD, [facility] Neurosurgery

## 2022-05-11 NOTE — ED Notes (Signed)
Pt provided with pillows for comfort

## 2022-05-11 NOTE — Progress Notes (Signed)
Orthopedic Tech Progress Note Patient Details:  Jamie [name] October 02, 1955 [id number]  Patient ID: [name], female   DOB: [birth year], 67 y.o.   MRN: [id number] Order placed with hanger for TLSO. [place] A Andrina Locken 05/11/2022, 1:09 AM

## 2022-05-11 NOTE — ED Notes (Signed)
This SRN and primary RN Museum/gallery curator) in to ambulate pt after pain medication administration. Pt was able to stand and take 2 steps. Pt complaining of Left leg pain that radiates "up around my waist". Pt back to wheel chair and states that pain has subsided when pt is not moving.

## 2022-05-11 NOTE — H&P (Signed)
History and Physical    Jamie [name]:[id number] DOB: January 05, 1956 DOA: 05/10/2022  Referring MD/NP/PA:   PCP: Judy Pimple, MD   Patient coming from:  The patient is coming from home.  At baseline, pt is independent for most of ADL.         Chief Complaint: fall and lower back pain  HPI: Jamie [name] is a 67 y.o. female with medical history significant of HTN, HLD, DM, CKD-3a, OSA on CPAP, morbid obesity, anemia, gallstone, kidney stone, ulcerative colitis (s/p of colectomy and s/p ileostomy), who presents with fall and lower back pain.   Pt states that she accidentally fell in church yesterday evening, landing on her back, and possibly hit her head. She immediately developed middle back pain, which is constant, sharp, severe, 10 out of 10 in severity, nonradiating, aggravated by movement.  No loss of consciousness.  She has chronic peripheral neuropathy, no leg weakness.  Denies loss control for bladder or bowel movement.  Patient had some shortness of breath earlier, which has resolved.  Currently no chest pain, shortness breath, cough.  No fever or chills.  No nausea, vomiting, diarrhea or abdominal pain.  No symptoms of UTI.  Data reviewed independently and ED Course: pt was found to have WBC 13.5, worsening renal function with creatinine 1.82, BUN 23, GFR 30 (recent baseline creatinine 1.38 on 01/31/2022), temperature normal, blood pressure 148/47, heart rate 100, RR 14, oxygen saturation 97% on room air.  CT of head and neck negative for acute injury.  Patient is admitted to telemetry bed as inpatient.  Dr. Marcell Barlow for neurosurgery is consulted.  MRI-L spin 1. Acute nondisplaced transverse fracture through the superior aspect of the L1 vertebral body and posterior elements. 2. Multilevel lumbar disc and facet degeneration, most notable at L4-5 where there is moderate spinal stenosis.  MRI-T spin: Minimal edema type signal along the C7 and T8 superior endplates without a  definite acute fracture when correlating with CT.  CT of T- and L spin: 1. Osteopenia and degenerative change thoracic spine without evidence of thoracic spine fractures or malalignment. 2. Cardiomegaly with aortic and coronary artery atherosclerosis. 3. Acute superior endplate anterior wedge compression fracture of L1 with 25% loss of anterior height but no posterior cortical retropulsion or paraspinal hematoma. 4. Transverse nondisplaced fracture through the superior L1 body extends through both pedicles and dorsally through the superior articular facet of L1 and on the right also through the inferior articular facet of T12, with ankylosis of the facet joints seen previously. The posterior fractures are also nondisplaced. 5. Osteopenia and degenerative change without further appreciable lumbar spine fractures. 6. Nonobstructive left nephrolithiasis. 7. Cluster of extruded gallstones in Morison's pouch. Status post cholecystectomy.   Aortic Atherosclerosis (ICD10-I70.0).    EKG:   Not done in ED, will get one.    Review of Systems:   General: no fevers, chills, no body weight gain, has fatigue HEENT: no blurry vision, hearing changes or sore throat Respiratory: no dyspnea, coughing, wheezing CV: no chest pain, no palpitations GI: no nausea, vomiting, abdominal pain, diarrhea, constipation GU: no dysuria, burning on urination, increased urinary frequency, hematuria  Ext: no leg edema Neuro: no unilateral weakness, numbness, or tingling, no vision change or hearing loss. Has fall. Skin: no rash, no skin tear. MSK: has back pain Heme: No easy bruising.  Travel history: No recent long distant travel.   Allergy:  Allergies  Allergen Reactions   Ciprofloxacin Rash    Erythema  and pruritis around IV site, erythema and rash along vein   Other Rash   Jardiance [Empagliflozin]     Increase in Cr    Lisinopril     REACTION: felt bad/ stomach hurt/ weak and tired   Metformin  And Related     GI   Sulfa Antibiotics Rash    Past Medical History:  Diagnosis Date   Anemia    Arthritis    generalized   Bowel obstruction    repeated (? possible adhesions) neg EGD   Chronic kidney disease    Colitis    with colectomy   Diabetes mellitus without complication    Dyspnea    covid   Gall stones 01/23/2006   Gastroenteritis 07/25/2014   hosp for IVF    GERD (gastroesophageal reflux disease)    History of kidney stones    HTN (hypertension)    Hyperglycemia    mild-monitors A1C   Obesity    Pneumonia    Rosacea    Sleep apnea    CPAP everynight    Past Surgical History:  Procedure Laterality Date   CHOLECYSTECTOMY     COLECTOMY  1993   has ileostomy   COLON SURGERY  2021   removed rectum   COLONOSCOPY  08/1991   COLOSTOMY REVISION  12/06/2011   Procedure: COLOSTOMY REVISION;  Surgeon: Romie Levee, MD;  Location: [facility];  Service: General;  Laterality: Right;  OSTOMY revision   CYSTOSCOPY/URETEROSCOPY/HOLMIUM LASER/STENT PLACEMENT Left 04/11/2021   Procedure: CYSTOSCOPY/URETEROSCOPY/HOLMIUM LASER/STENT PLACEMENT;  Surgeon: Vanna Scotland, MD;  Location: [facility];  Service: Urology;  Laterality: Left;   EXAMINATION UNDER ANESTHESIA  12/06/2011   Procedure: EXAM UNDER ANESTHESIA;  Surgeon: Romie Levee, MD;  Location: [facility];  Service: General;  Laterality: N/A;  rectal exam under anesthesia, anal dilation, pouchoscopy     TOTAL ABDOMINAL HYSTERECTOMY  05/2006   for abscessed ovaries    Social History:  reports that she has never smoked. She has never used smokeless tobacco. She reports that she does not drink alcohol and does not use drugs.  Family History:  Family History  Problem Relation Age of Onset   Hypertension Mother    Hyperlipidemia Mother    Cirrhosis Mother        NASH   Diabetes Mother    Liver cancer Father        resection secondary to mets   Cancer Father        colon    Hyperlipidemia Father    Hypertension Father    Allergies Father    Ulcerative colitis Father    Allergies Brother    Gastric cancer Brother    Breast cancer Paternal Grandmother    Breast cancer Cousin 35       paternal cousins     Prior to Admission medications   Medication Sig Start Date End Date Taking? Authorizing Provider  oxyCODONE-acetaminophen (PERCOCET) 5-325 MG tablet Take 1 tablet by mouth every 6 (six) hours as needed for up to 7 days for severe pain. 05/11/22 05/18/22 Yes Dionne Bucy, MD  ACCU-CHEK GUIDE test strip TO CHECK GLUCOSE DAILY AND AS NEEDED FOR TYPE 2 DIABETES 02/13/22   Tower, Audrie Gallus, MD  acetaminophen (TYLENOL) 325 MG tablet Take 650 mg by mouth every 6 (six) hours as needed for moderate pain.    [provider]  albuterol (VENTOLIN HFA) 108 (90 Base) MCG/ACT inhaler Inhale 2 puffs into the lungs every 4 (four)  hours as needed for wheezing or shortness of breath. 02/10/21   Erin Fulling, MD  atorvastatin (LIPITOR) 10 MG tablet TAKE 1/2 PILL BY MOUTH EVERY OTHER DAY 05/02/22   Tower, Audrie Gallus, MD  Blood Glucose Monitoring Suppl (ACCU-CHEK GUIDE) w/Device KIT To check glucose daily and as needed for type 2 diabetes 06/26/19   Tower, Audrie Gallus, MD  Cholecalciferol (VITAMIN D3) 25 MCG (1000 UT) CAPS Take 1,000 Units by mouth daily.    [provider]  esomeprazole (NEXIUM) 20 MG capsule Take 20 mg by mouth every other day.    [provider]  fluticasone (FLONASE) 50 MCG/ACT nasal spray Place 2 sprays into both nostrils at bedtime.    [provider]  glipiZIDE (GLUCOTROL) 5 MG tablet Take 1 tablet (5 mg total) by mouth daily before breakfast. 03/23/22   Tower, Audrie Gallus, MD  hyoscyamine (LEVSIN SL) 0.125 MG SL tablet Take 1 tablet (0.125 mg total) by mouth every 4 (four) hours as needed. For abdominal cramps 01/31/18   Tower, Audrie Gallus, MD  Lancets (ACCU-CHEK MULTICLIX) lancets To check glucose daily and as needed for diabetes type 2 06/26/19    Tower, Audrie Gallus, MD  losartan (COZAAR) 25 MG tablet Take 25 mg by mouth daily. 02/15/21   [provider]  melatonin 1 MG TABS tablet Take 1.5 mg by mouth at bedtime.    [provider]  metoprolol tartrate (LOPRESSOR) 25 MG tablet TAKE 1 TABLET BY MOUTH EVERY DAY 10/12/21   Tower, Audrie Gallus, MD  Multiple Vitamin (MULTIVITAMIN) tablet Take 1 tablet by mouth daily.    [provider]  NON FORMULARY SLEEPS WITH BI-PAP    [provider]  tirzepatide Greggory Keen) 5 MG/0.5ML Pen Inject 5 mg into the skin once a week. 02/10/22   Tower, Audrie Gallus, MD  zolpidem (AMBIEN) 10 MG tablet Take 0.5-1 tablets (5-10 mg total) by mouth at bedtime as needed. for sleep 05/02/19   Judy Pimple, MD    Physical Exam: Vitals:   05/10/22 2114 05/11/22 0924 05/11/22 1500  BP: (!) 157/64 (!) 148/47 (!) 154/54  Pulse: 86 100 (!) 101  Resp: 18 14   Temp: 98.2 F (36.8 C) 97.6 F (36.4 C)   TempSrc: Oral Oral   SpO2: 100% 97%    General: Not in acute distress HEENT:       Eyes: PERRL, EOMI, no scleral icterus.       ENT: No discharge from the ears and nose, no pharynx injection, no tonsillar enlargement.        Neck: No JVD, no bruit, no mass felt. Heme: No neck lymph node enlargement. Cardiac: S1/S2, RRR, No murmurs, No gallops or rubs. Respiratory: No rales, wheezing, rhonchi or rubs. GI: Soft, nondistended, nontender, no rebound pain, no organomegaly, BS present. GU: No hematuria Ext: No pitting leg edema bilaterally. 1+DP/PT pulse bilaterally. Musculoskeletal: No joint deformities, No joint redness or warmth, no limitation of ROM in spin. Skin: No rashes.  Neuro: Alert, oriented X3, cranial nerves II-XII grossly intact, moves all extremities normally. Muscle strength 5/5 in all extremities, sensation to light touch intact. Brachial reflex 2+ bilaterally. Knee reflex 1+ bilaterally. Negative Babinski's sign. Normal finger to nose test. Psych: Patient is not psychotic, no suicidal  or hemocidal ideation.  Labs on Admission: I have personally reviewed following labs and imaging studies  CBC: Recent Labs  Lab 05/11/22 1144  WBC 13.5*  HGB 13.8  HCT 42.2  MCV 97.5  PLT  223   Basic Metabolic Panel: Recent Labs  Lab 05/11/22 1144  NA 133*  K 4.6  CL 103  CO2 20*  GLUCOSE 167*  BUN 23  CREATININE 1.82*  CALCIUM 9.2   GFR: Estimated Creatinine Clearance: 39.3 mL/min (A) (by C-G formula based on SCr of 1.82 mg/dL (H)). Liver Function Tests: No results for input(s): "AST", "ALT", "ALKPHOS", "BILITOT", "PROT", "ALBUMIN" in the last 168 hours. No results for input(s): "LIPASE", "AMYLASE" in the last 168 hours. No results for input(s): "AMMONIA" in the last 168 hours. Coagulation Profile: No results for input(s): "INR", "PROTIME" in the last 168 hours. Cardiac Enzymes: No results for input(s): "CKTOTAL", "CKMB", "CKMBINDEX", "TROPONINI" in the last 168 hours. BNP (last 3 results) No results for input(s): "PROBNP" in the last 8760 hours. HbA1C: No results for input(s): "HGBA1C" in the last 72 hours. CBG: Recent Labs  Lab 05/11/22 1208  GLUCAP 150*   Lipid Profile: No results for input(s): "CHOL", "HDL", "LDLCALC", "TRIG", "CHOLHDL", "LDLDIRECT" in the last 72 hours. Thyroid Function Tests: No results for input(s): "TSH", "T4TOTAL", "FREET4", "T3FREE", "THYROIDAB" in the last 72 hours. Anemia Panel: No results for input(s): "VITAMINB12", "FOLATE", "FERRITIN", "TIBC", "IRON", "RETICCTPCT" in the last 72 hours. Urine analysis:    Component Value Date/Time   COLORURINE YELLOW (A) 05/30/2021 0511   APPEARANCEUR HAZY (A) 05/30/2021 0511   LABSPEC 1.021 05/30/2021 0511   PHURINE 5.0 05/30/2021 0511   GLUCOSEU NEGATIVE 05/30/2021 0511   HGBUR NEGATIVE 05/30/2021 0511   HGBUR trace-lysed 09/08/2008 1009   BILIRUBINUR NEGATIVE 05/30/2021 0511   BILIRUBINUR negative 01/12/2021 1459   KETONESUR NEGATIVE 05/30/2021 0511   PROTEINUR NEGATIVE 05/30/2021  0511   UROBILINOGEN 0.2 01/12/2021 1459   UROBILINOGEN 0.2 09/08/2008 1009   NITRITE NEGATIVE 05/30/2021 0511   LEUKOCYTESUR SMALL (A) 05/30/2021 0511   Sepsis Labs: @LABRCNTIP (procalcitonin:4,lacticidven:4) )No results found for this or any previous visit (from the past 240 hour(s)).   Radiological Exams on Admission: CT HEAD WO CONTRAST ( )  Result Date: 05/11/2022 CLINICAL DATA:  Fall EXAM: CT HEAD WITHOUT CONTRAST CT CERVICAL SPINE WITHOUT CONTRAST TECHNIQUE: Multidetector CT imaging of the head and cervical spine was performed following the standard protocol without intravenous contrast. Multiplanar CT image reconstructions of the cervical spine were also generated. RADIATION DOSE REDUCTION: This exam was performed according to the departmental dose-optimization program which includes automated exposure control, adjustment of the mA and/or kV according to patient size and/or use of iterative reconstruction technique. COMPARISON:  CT head 01/09/2014, no prior CT cervical spine FINDINGS: CT HEAD FINDINGS Brain: No evidence of acute infarct, hemorrhage, mass, mass effect, or midline shift. No hydrocephalus or extra-axial fluid collection. Vascular: No hyperdense vessel. Skull: Negative for fracture or focal lesion. Sinuses/Orbits: No acute finding. Other: The mastoid air cells are well aerated. CT CERVICAL SPINE FINDINGS Alignment: No traumatic listhesis. Straightening of the normal cervical lordosis. Skull base and vertebrae: Evaluation of the lower cervical spine is somewhat limited by artifact related to overlapping soft tissues. Within this limitation, no acute fracture. No primary bone lesion or focal pathologic process. Prominent anterior osteophytes at C1 and C3-C6, which indents on the posterior aspect of the pharynx. Soft tissues and spinal canal: No prevertebral fluid or swelling. No visible canal hematoma. Disc levels: Degenerative changes in the cervical spine. No significant spinal  canal stenosis. Upper chest: Negative. IMPRESSION: 1. No acute intracranial process. 2. No acute fracture or traumatic listhesis in the cervical spine. Electronically Signed   By: Wiliam Ke  M.D.   On: 05/11/2022 11:56   CT CERVICAL SPINE WO CONTRAST  Result Date: 05/11/2022 CLINICAL DATA:  Fall EXAM: CT HEAD WITHOUT CONTRAST CT CERVICAL SPINE WITHOUT CONTRAST TECHNIQUE: Multidetector CT imaging of the head and cervical spine was performed following the standard protocol without intravenous contrast. Multiplanar CT image reconstructions of the cervical spine were also generated. RADIATION DOSE REDUCTION: This exam was performed according to the departmental dose-optimization program which includes automated exposure control, adjustment of the mA and/or kV according to patient size and/or use of iterative reconstruction technique. COMPARISON:  CT head 01/09/2014, no prior CT cervical spine FINDINGS: CT HEAD FINDINGS Brain: No evidence of acute infarct, hemorrhage, mass, mass effect, or midline shift. No hydrocephalus or extra-axial fluid collection. Vascular: No hyperdense vessel. Skull: Negative for fracture or focal lesion. Sinuses/Orbits: No acute finding. Other: The mastoid air cells are well aerated. CT CERVICAL SPINE FINDINGS Alignment: No traumatic listhesis. Straightening of the normal cervical lordosis. Skull base and vertebrae: Evaluation of the lower cervical spine is somewhat limited by artifact related to overlapping soft tissues. Within this limitation, no acute fracture. No primary bone lesion or focal pathologic process. Prominent anterior osteophytes at C1 and C3-C6, which indents on the posterior aspect of the pharynx. Soft tissues and spinal canal: No prevertebral fluid or swelling. No visible canal hematoma. Disc levels: Degenerative changes in the cervical spine. No significant spinal canal stenosis. Upper chest: Negative. IMPRESSION: 1. No acute intracranial process. 2. No acute fracture  or traumatic listhesis in the cervical spine. Electronically Signed   By: Wiliam Ke M.D.   On: 05/11/2022 11:56   MR LUMBAR SPINE WO CONTRAST  Result Date: 05/11/2022 CLINICAL DATA:  Low back pain, trauma.  Fall. EXAM: MRI LUMBAR SPINE WITHOUT CONTRAST TECHNIQUE: Multiplanar, multisequence MR imaging of the lumbar spine was performed. No intravenous contrast was administered. COMPARISON:  Lumbar spine CT 05/10/2022 FINDINGS: Segmentation:  Standard. Alignment:  Trace anterolisthesis of L4 on L5. Vertebrae: Has shown on CT, there is an acute transverse fracture through the superior aspect of the L1 vertebral body which extends through the posterior elements bilaterally (pedicles and superior articular processes) without significant displacement. There is only at most subtle anterior compression of the L1 vertebral body. No other lumbar fracture is identified. Conus medullaris and cauda equina: Conus extends to the upper L1 level. Conus and cauda equina appear normal. Paraspinal and other soft tissues: Unremarkable. Disc levels: Disc desiccation throughout the lumbar spine with largely preserved disc space heights. T12-L1: Mild facet hypertrophy without disc herniation or stenosis. L1-2: Mild disc bulging and mild facet hypertrophy without stenosis. L2-3: Mild disc bulging and moderate facet hypertrophy result in borderline to mild right lateral recess stenosis and moderate right neural foraminal stenosis without spinal stenosis. L3-4: Disc bulging and severe facet hypertrophy result in mild spinal stenosis, mild bilateral lateral recess stenosis, and mild right neural foraminal stenosis. L4-5: Anterolisthesis with mild bulging of uncovered disc and severe facet hypertrophy result in moderate spinal stenosis, moderate bilateral lateral recess stenosis, and minimal to mild right and mild-to-moderate left neural foraminal stenosis. L5-S1: Minimal disc bulging and moderate to severe right and mild-to-moderate  left facet hypertrophy without significant stenosis. IMPRESSION: 1. Acute nondisplaced transverse fracture through the superior aspect of the L1 vertebral body and posterior elements. 2. Multilevel lumbar disc and facet degeneration, most notable at L4-5 where there is moderate spinal stenosis. Electronically Signed   By: Sebastian Ache M.D.   On: 05/11/2022 11:34   MR  THORACIC SPINE WO CONTRAST  Result Date: 05/11/2022 CLINICAL DATA:  Back trauma, abnormal neuro exam, CT or xray positive (Age >= 16y). Fall. EXAM: MRI THORACIC SPINE WITHOUT CONTRAST TECHNIQUE: Multiplanar, multisequence MR imaging of the thoracic spine was performed. No intravenous contrast was administered. COMPARISON:  Thoracic spine CT 05/10/2022 FINDINGS: Alignment:  Normal. Vertebrae: Minimal STIR hyperintensity/edema along the C7 and T8 superior endplates without a definite acute fracture when correlating with CT. Partial defect in bridging anterior osteophytes/syndesmophytes at T7-8 appears corticated/chronic on CT. No suspicious marrow lesion. Cord:  Normal signal and morphology. Paraspinal and other soft tissues: Unremarkable. Disc levels: Mild diffuse thoracic spondylosis and facet arthrosis without significant stenosis. IMPRESSION: Minimal edema type signal along the C7 and T8 superior endplates without a definite acute fracture when correlating with CT. Electronically Signed   By: Sebastian Ache M.D.   On: 05/11/2022 11:20   CT Thoracic Spine Wo Contrast  Result Date: 05/11/2022 CLINICAL DATA:  Mid and lower back pain following a fall onto her back down an unknown number of steps at church today. Age-indeterminate L1 compression fracture noted on thoracic spine series today. EXAM: CT THORACIC AND LUMBAR SPINE WITHOUT CONTRAST TECHNIQUE: Multidetector CT imaging of the thoracic and lumbar spine was performed without intravenous contrast. Multiplanar CT image reconstructions were also generated. RADIATION DOSE REDUCTION: This exam was  performed according to the departmental dose-optimization program which includes automated exposure control, adjustment of the mA and/or kV according to patient size and/or use of iterative reconstruction technique. COMPARISON:  Thoracic spine series today, CT abdomen and pelvis and reconstructed images 05/30/21. FINDINGS: CT THORACIC SPINE FINDINGS Segmentation: There are 12 rib-bearing thoracic segments, with osteopenia. There is a rudimentary cervical rib on the left at C7. Alignment: Within normal limits. Vertebrae: No acute fracture is evident. The spinous processes abut in the midline at most thoracic levels. There is intact bridging enthesopathy compatible with DISH throughout, with degenerative discs with scattered disc space calcifications in the lower thoracic spine. Most of the posteromedial ribs are fused to the vertebrae and some are also fused to both the vertebrae and the transverse processes. Paraspinal and other soft tissues: There is no paraspinal hematoma or soft tissue injection. No canal hematoma is seen. There is cardiomegaly with coronary artery and aortic atherosclerosis. No pneumothorax is seen the visualized chest. Visualized lungs are clear as well as can be seen accounting for respiratory motion. Disc levels: The thoracic discs are degenerated particularly in the lower half of the thoracic spine. There are mild features of spondylosis. There are mild posterior disc osteophyte complexes at T6-7, T7-8, T8-9, T9-10, T10-11, and T11-12 but no mass effect on the cord surface or herniated discs are seen. There are multilevel facet spurring changes but no significant foraminal encroachment in the thoracic spine. CT LUMBAR SPINE FINDINGS Segmentation: 5 lumbar type vertebrae. Alignment: Normal except for minimal grade 1 degenerative anterolisthesis at L4-5, stable. Vertebrae: Osteopenia. There is an acute superior endplate anterior wedge compression fracture of L1 vertebral body. There is no  posterior cortical retropulsion. Loss of anterior height is 25%.  No posterior height loss is seen. There is fragmentation along the anterior superior L1 body, and a transverse nondisplaced fracture line extending through the superior L1 body. The fracture continues posteriorly through both pedicles, and continuing dorsally through the superior articular facets of L1 and on the right also passes through the adjacent inferior articular facet of T12, with ankylosis of the facet joints previously noted. Other vertebrae do not  show evidence of fractures. There are bridging osteophytes all levels except for L4-5, where there is attempted bridging. Paraspinal and other soft tissues: Aortic atherosclerosis. No paraspinal hematoma. Nonobstructive nephrolithiasis on the left. Cluster of extruded gallstones are Morrison's pouch status post cholecystectomy. Coarse chronic presacral stranding changes. Disc levels: There is partial disc space loss once again at T12-L1 and L1-2. There are posterior disc osteophyte complexes at both levels without canal hematoma or significant spinal stenosis. At L1-2 posterior disc osteophyte complex again partially effaces the ventral CSF and there is mild facet hypertrophy without foraminal stenosis. L2-3, right-greater-than-left moderate facet hypertrophy is seen with encroachment on the right subarticular recess and mild spinal canal stenosis due to posterior osteophytes. Due to facet hypertrophy there is moderate severe right and mild-to-moderate left foraminal stenosis. L3-4 demonstrating moderate-to-severe dorsal facet hypertrophy and a posterior disc osteophyte complex and ligamentous thickening causing mild spinal canal encroachment. There is mild-to-moderate foraminal stenosis. More advanced facet hypertrophy L4-5 is seen with moderate to severe acquired spinal stenosis due to ligamentous and facet hypertrophy and dorsal disc osteophyte complex. There is bilateral mild-to-moderate  foraminal stenosis. At L5-S1, moderate facet osteophytes and posterior disc osteophyte complex causing mild compression of the S1 nerve roots. There is moderate foraminal stenosis. IMPRESSION: 1. Osteopenia and degenerative change thoracic spine without evidence of thoracic spine fractures or malalignment. 2. Cardiomegaly with aortic and coronary artery atherosclerosis. 3. Acute superior endplate anterior wedge compression fracture of L1 with 25% loss of anterior height but no posterior cortical retropulsion or paraspinal hematoma. 4. Transverse nondisplaced fracture through the superior L1 body extends through both pedicles and dorsally through the superior articular facet of L1 and on the right also through the inferior articular facet of T12, with ankylosis of the facet joints seen previously. The posterior fractures are also nondisplaced. 5. Osteopenia and degenerative change without further appreciable lumbar spine fractures. 6. Nonobstructive left nephrolithiasis. 7. Cluster of extruded gallstones in Morison's pouch. Status post cholecystectomy. Aortic Atherosclerosis (ICD10-I70.0). Electronically Signed   By: Almira Bar M.D.   On: 05/11/2022 00:09   CT Lumbar Spine Wo Contrast  Result Date: 05/11/2022 CLINICAL DATA:  Mid and lower back pain following a fall onto her back down an unknown number of steps at church today. Age-indeterminate L1 compression fracture noted on thoracic spine series today. EXAM: CT THORACIC AND LUMBAR SPINE WITHOUT CONTRAST TECHNIQUE: Multidetector CT imaging of the thoracic and lumbar spine was performed without intravenous contrast. Multiplanar CT image reconstructions were also generated. RADIATION DOSE REDUCTION: This exam was performed according to the departmental dose-optimization program which includes automated exposure control, adjustment of the mA and/or kV according to patient size and/or use of iterative reconstruction technique. COMPARISON:  Thoracic spine series  today, CT abdomen and pelvis and reconstructed images 05/30/21. FINDINGS: CT THORACIC SPINE FINDINGS Segmentation: There are 12 rib-bearing thoracic segments, with osteopenia. There is a rudimentary cervical rib on the left at C7. Alignment: Within normal limits. Vertebrae: No acute fracture is evident. The spinous processes abut in the midline at most thoracic levels. There is intact bridging enthesopathy compatible with DISH throughout, with degenerative discs with scattered disc space calcifications in the lower thoracic spine. Most of the posteromedial ribs are fused to the vertebrae and some are also fused to both the vertebrae and the transverse processes. Paraspinal and other soft tissues: There is no paraspinal hematoma or soft tissue injection. No canal hematoma is seen. There is cardiomegaly with coronary artery and aortic atherosclerosis. No pneumothorax is  seen the visualized chest. Visualized lungs are clear as well as can be seen accounting for respiratory motion. Disc levels: The thoracic discs are degenerated particularly in the lower half of the thoracic spine. There are mild features of spondylosis. There are mild posterior disc osteophyte complexes at T6-7, T7-8, T8-9, T9-10, T10-11, and T11-12 but no mass effect on the cord surface or herniated discs are seen. There are multilevel facet spurring changes but no significant foraminal encroachment in the thoracic spine. CT LUMBAR SPINE FINDINGS Segmentation: 5 lumbar type vertebrae. Alignment: Normal except for minimal grade 1 degenerative anterolisthesis at L4-5, stable. Vertebrae: Osteopenia. There is an acute superior endplate anterior wedge compression fracture of L1 vertebral body. There is no posterior cortical retropulsion. Loss of anterior height is 25%.  No posterior height loss is seen. There is fragmentation along the anterior superior L1 body, and a transverse nondisplaced fracture line extending through the superior L1 body. The fracture  continues posteriorly through both pedicles, and continuing dorsally through the superior articular facets of L1 and on the right also passes through the adjacent inferior articular facet of T12, with ankylosis of the facet joints previously noted. Other vertebrae do not show evidence of fractures. There are bridging osteophytes all levels except for L4-5, where there is attempted bridging. Paraspinal and other soft tissues: Aortic atherosclerosis. No paraspinal hematoma. Nonobstructive nephrolithiasis on the left. Cluster of extruded gallstones are Morrison's pouch status post cholecystectomy. Coarse chronic presacral stranding changes. Disc levels: There is partial disc space loss once again at T12-L1 and L1-2. There are posterior disc osteophyte complexes at both levels without canal hematoma or significant spinal stenosis. At L1-2 posterior disc osteophyte complex again partially effaces the ventral CSF and there is mild facet hypertrophy without foraminal stenosis. L2-3, right-greater-than-left moderate facet hypertrophy is seen with encroachment on the right subarticular recess and mild spinal canal stenosis due to posterior osteophytes. Due to facet hypertrophy there is moderate severe right and mild-to-moderate left foraminal stenosis. L3-4 demonstrating moderate-to-severe dorsal facet hypertrophy and a posterior disc osteophyte complex and ligamentous thickening causing mild spinal canal encroachment. There is mild-to-moderate foraminal stenosis. More advanced facet hypertrophy L4-5 is seen with moderate to severe acquired spinal stenosis due to ligamentous and facet hypertrophy and dorsal disc osteophyte complex. There is bilateral mild-to-moderate foraminal stenosis. At L5-S1, moderate facet osteophytes and posterior disc osteophyte complex causing mild compression of the S1 nerve roots. There is moderate foraminal stenosis. IMPRESSION: 1. Osteopenia and degenerative change thoracic spine without evidence  of thoracic spine fractures or malalignment. 2. Cardiomegaly with aortic and coronary artery atherosclerosis. 3. Acute superior endplate anterior wedge compression fracture of L1 with 25% loss of anterior height but no posterior cortical retropulsion or paraspinal hematoma. 4. Transverse nondisplaced fracture through the superior L1 body extends through both pedicles and dorsally through the superior articular facet of L1 and on the right also through the inferior articular facet of T12, with ankylosis of the facet joints seen previously. The posterior fractures are also nondisplaced. 5. Osteopenia and degenerative change without further appreciable lumbar spine fractures. 6. Nonobstructive left nephrolithiasis. 7. Cluster of extruded gallstones in Morison's pouch. Status post cholecystectomy. Aortic Atherosclerosis (ICD10-I70.0). Electronically Signed   By: Almira Bar M.D.   On: 05/11/2022 00:09   DG Thoracic Spine 2 View  Result Date: 05/10/2022 CLINICAL DATA:  pain EXAM: THORACIC SPINE 2 VIEWS COMPARISON:  CT abdomen pelvis 05/30/2021. FINDINGS: There is no evidence of thoracic spine fracture. Interval development of an age-indeterminate L1  anterior wedge compression fracture. Multilevel mild degenerative changes spine. No other significant bone abnormalities are identified. IMPRESSION: 1. No acute displaced fracture or traumatic listhesis of the thoracic spine. 2. Interval development of an age-indeterminate L1 anterior wedge compression fracture. Recommend correlation with point tenderness to palpation to evaluate for an acute component. Electronically Signed   By: Tish Frederickson M.D.   On: 05/10/2022 21:42      Assessment/Plan Principal Problem:   Compression fracture of L1 lumbar vertebra Active Problems:   Fall at home, initial encounter   Essential hypertension   Ulcerative colitis   DM (diabetes mellitus), type 2 with renal complications   Hyperlipidemia associated with type 2 diabetes  mellitus   Chronic kidney disease, stage 3a   Leukocytosis   OSA (obstructive sleep apnea)   Morbid obesity   Assessment and Plan:  Compression fracture of L1 lumbar vertebra: consulted Dr. Marcell Barlow of neurosurgery, planning to do surgery in the morning.  -Admitted to telemetry bed as inpatient - Pain control: prn morphine, percocet and tyleno - Robaxin for muscle spasm - Lidoderm patch for pain - type and cross - INR/PTT - PT/OT when able to (not ordered now) -N.p.o. after midnight  Fall at home, initial encounter -fall precaution  Essential hypertension -IV hydralazine as needed -Metoprolol -Cozaar due to worsening renal function  Ulcerative colitis: S/p of colectomy and ileostomy -No acute issues  DM (diabetes mellitus), type 2 with renal complications: Recent A1c 6.4, well-controlled.  Patient is taking glipizide and Monnjaro -Sliding scale insulin  Hyperlipidemia associated with type 2 diabetes mellitus -Lipitor  Chronic kidney disease, stage 3a: Renal function is worsening than baseline. -Hold Cozaar -NS 75 cc/h for 8 hours -Monitor renal function by BMP  Leukocytosis: WBC 13.5, no fever, no signs of infection, likely reactive -Follow-up with CBC  OSA (obstructive sleep apnea) -Continue home BiPAP  Morbid obesity: Body weight 120.2 kg, BMI 43.43 -Encourage to lose some weight -Healthy diet and exercise    DVT ppx: SQ Heparin   Code Status: Full code  Family Communication:   Yes, patient's husband  at bed side.    Disposition Plan:  Anticipate discharge back to previous environment  Consults called: Dr. Marcell Barlow for neurosurgery  Admission status and Level of care: Med-Surg:    as inpt     Dispo: The patient is from: Home              Anticipated d/c is to: to be determined              Anticipated d/c date is: 2 days              Patient currently is not medically stable to d/c.    Severity of Illness:  The appropriate patient  status for this patient is INPATIENT. Inpatient status is judged to be reasonable and necessary in order to provide the required intensity of service to ensure the patient's safety. The patient's presenting symptoms, physical exam findings, and initial radiographic and laboratory data in the context of their chronic comorbidities is felt to place them at high risk for further clinical deterioration. Furthermore, it is not anticipated that the patient will be medically stable for discharge from the hospital within 2 midnights of admission.   * I certify that at the point of admission it is my clinical judgment that the patient will require inpatient hospital care spanning beyond 2 midnights from the point of admission due to high intensity of service, high risk for  further deterioration and high frequency of surveillance required.*       Date of Service 05/11/2022    Lorretta Harp [facility]   If 7PM-7AM, please contact night-coverage [email or web address] 05/11/2022, 3:41 PM

## 2022-05-11 NOTE — Consult Note (Addendum)
  Consult requested by:  Dr. Siadecki  Consult requested for:  L1 fracture  Primary Physician:  Tower, Marne A, MD  History of Present Illness: 05/11/2022 [name] is here today with a chief complaint of back pain after a fall.  She was walking down the stairs at church last evening when she had a mechanical fall.  She hit her in the middle of her back on the bottom stair and felt immediate severe pain.  She presented to the emergency department due to severe pain.  Radiographic workup revealed a significant T12 and L1 fracture.  She was unable to ambulate significantly in a TLSO brace, prompting further imaging.  I have utilized the care everywhere function in epic to review the outside records available from external health systems.  Review of Systems:  A 10 point review of systems is negative, except for the pertinent positives and negatives detailed in the HPI.  Past Medical History: Past Medical History:  Diagnosis Date   Anemia    Arthritis    generalized   Bowel obstruction    repeated (? possible adhesions) neg EGD   Chronic kidney disease    Colitis    with colectomy   Diabetes mellitus without complication    Dyspnea    covid   Gall stones 01/23/2006   Gastroenteritis 07/25/2014   hosp for IVF    GERD (gastroesophageal reflux disease)    History of kidney stones    HTN (hypertension)    Hyperglycemia    mild-monitors A1C   Obesity    Pneumonia    Rosacea    Sleep apnea    CPAP everynight    Past Surgical History: Past Surgical History:  Procedure Laterality Date   CHOLECYSTECTOMY     COLECTOMY  1993   has ileostomy   COLON SURGERY  2021   removed rectum   COLONOSCOPY  08/1991   COLOSTOMY REVISION  12/06/2011   Procedure: COLOSTOMY REVISION;  Surgeon: Alicia Thomas, MD;  Location: [facility];  Service: General;  Laterality: Right;  OSTOMY revision   CYSTOSCOPY/URETEROSCOPY/HOLMIUM LASER/STENT PLACEMENT Left 04/11/2021    Procedure: CYSTOSCOPY/URETEROSCOPY/HOLMIUM LASER/STENT PLACEMENT;  Surgeon: Brandon, Ashley, MD;  Location: [facility];  Service: Urology;  Laterality: Left;   EXAMINATION UNDER ANESTHESIA  12/06/2011   Procedure: EXAM UNDER ANESTHESIA;  Surgeon: Alicia Thomas, MD;  Location:  SURGERY CENTER;  Service: General;  Laterality: N/A;  rectal exam under anesthesia, anal dilation, pouchoscopy     TOTAL ABDOMINAL HYSTERECTOMY  05/2006   for abscessed ovaries    Allergies: Allergies as of 05/10/2022 - Review Complete 05/10/2022  Allergen Reaction Noted   Ciprofloxacin Rash 10/02/2012   Other Rash 07/13/2021   Jardiance [empagliflozin]  02/13/2020   Lisinopril  07/30/2009   Metformin and related  05/14/2019   Sulfa antibiotics Rash 11/08/2014    Medications: Current Meds  Medication Sig   acetaminophen (TYLENOL) 325 MG tablet Take 650 mg by mouth every 6 (six) hours as needed for moderate pain.   atorvastatin (LIPITOR) 10 MG tablet TAKE 1/2 PILL BY MOUTH EVERY OTHER DAY (Patient taking differently: Take 5 mg by mouth every other day.)   Cholecalciferol (VITAMIN D3) 25 MCG (1000 UT) CAPS Take 1,000 Units by mouth daily.   esomeprazole (NEXIUM) 20 MG capsule Take 20 mg by mouth every other day.   fluticasone (FLONASE) 50 MCG/ACT nasal spray Place 2 sprays into both nostrils at bedtime.   glipiZIDE (GLUCOTROL) 5 MG tablet Take   1 tablet (5 mg total) by mouth daily before breakfast.   losartan (COZAAR) 25 MG tablet Take 25 mg by mouth daily.   melatonin 1 MG TABS tablet Take 1.5 mg by mouth at bedtime.   Multiple Vitamin (MULTIVITAMIN) tablet Take 1 tablet by mouth daily.   oxyCODONE-acetaminophen (PERCOCET) 5-325 MG tablet Take 1 tablet by mouth every 6 (six) hours as needed for up to 7 days for severe pain.   zolpidem (AMBIEN) 10 MG tablet Take 0.5-1 tablets (5-10 mg total) by mouth at bedtime as needed. for sleep    Social History: Social History   Tobacco Use   Smoking status:  Never   Smokeless tobacco: Never  Vaping Use   Vaping Use: Never used  Substance Use Topics   Alcohol use: No    Alcohol/week: 0.0 standard drinks of alcohol   Drug use: No    Family Medical History: Family History  Problem Relation Age of Onset   Hypertension Mother    Hyperlipidemia Mother    Cirrhosis Mother        NASH   Diabetes Mother    Liver cancer Father        resection secondary to mets   Cancer Father        colon   Hyperlipidemia Father    Hypertension Father    Allergies Father    Ulcerative colitis Father    Allergies Brother    Gastric cancer Brother    Breast cancer Paternal Grandmother    Breast cancer Cousin 35       paternal cousins    Physical Examination: Vitals:   05/10/22 2114 05/11/22 0924  BP: (!) 157/64 (!) 148/47  Pulse: 86 100  Resp: 18 14  Temp: 98.2 F (36.8 C) 97.6 F (36.4 C)  SpO2: 100% 97%    General: Patient is well developed, well nourished, calm, collected, but in pain with any movement. Attention to examination is appropriate.  Neck:   Supple.  Full range of motion.  Respiratory: Patient is breathing without any difficulty.   NEUROLOGICAL:     Awake, alert, oriented to person, place, and time.  Speech is clear and fluent.  Cranial Nerves: Pupils equal round and reactive to light.  Facial tone is symmetric.  Facial sensation is symmetric. Tongue protrusion is midline.      Strength: Side Biceps Triceps Deltoid Interossei Grip Wrist Ext. Wrist Flex.  R 5 5 5 5 5 5 5  L 5 5 5 5 5 5 5   Side Iliopsoas Quads Hamstring PF DF EHL  R 5 5 5 5 5 5  L 5 5 5 5 5 5   Reflexes are 1+ and symmetric at the biceps, triceps, brachioradialis, patella and achilles.   Hoffman's is absent.   Bilateral upper and lower extremity sensation is intact to light touch.    No evidence of dysmetria noted.  Gait is untested.     Medical Decision Making  Imaging: MR TL spine 05/11/2022 IMPRESSION: 1. Acute nondisplaced transverse  fracture through the superior aspect of the L1 vertebral body and posterior elements. 2. Multilevel lumbar disc and facet degeneration, most notable at L4-5 where there is moderate spinal stenosis.     Electronically Signed   By: Allen  Grady M.D.   On: 05/11/2022 11:34  CT TL spine 05/11/2022 IMPRESSION: 1. Osteopenia and degenerative change thoracic spine without evidence of thoracic spine fractures or malalignment. 2. Cardiomegaly with aortic and coronary artery atherosclerosis. 3. Acute   superior endplate anterior wedge compression fracture of L1 with 25% loss of anterior height but no posterior cortical retropulsion or paraspinal hematoma. 4. Transverse nondisplaced fracture through the superior L1 body extends through both pedicles and dorsally through the superior articular facet of L1 and on the right also through the inferior articular facet of T12, with ankylosis of the facet joints seen previously. The posterior fractures are also nondisplaced. 5. Osteopenia and degenerative change without further appreciable lumbar spine fractures. 6. Nonobstructive left nephrolithiasis. 7. Cluster of extruded gallstones in Morison's pouch. Status post cholecystectomy.   Aortic Atherosclerosis (ICD10-I70.0).     Electronically Signed   By: Keith  Chesser M.D.   On: 05/11/2022 00:09    I have personally reviewed the images and agree with the above interpretation.  Assessment and Plan: Ms. Robley is a pleasant 66 y.o. female with unstable L1 3 column fracture.  This is a chance type fracture.  She also has some involvement at T12, but the primary focus of her fracture is at L1.  This fracture is secondary to an underlying condition of diffuse idiopathic skeletal hyperostosis.  Due to the nature of this fracture, conservative management with bracing is not the most appropriate treatment.  I recommended fixation given the instability of the 3 column fracture.  This would involve  T11-L3 fixation to allow the L1 fracture and T12 fractures to heal.  I discussed the planned procedure at length with the patient, including the risks, benefits, alternatives, and indications. The risks discussed include but are not limited to bleeding, infection, need for reoperation, spinal fluid leak, stroke, vision loss, anesthetic complication, coma, paralysis, and even death. I also described in detail that improvement was not guaranteed.  The patient expressed understanding of these risks, and asked that we proceed with surgery. I described the surgery in layman's terms, and gave ample opportunity for questions, which were answered to the best of my ability.    I have communicated my recommendations to the requesting physician and coordinated care to facilitate these recommendations.     Artavia Jeanlouis K. Cruz Bong MD, MPHS Neurosurgery  

## 2022-05-12 ENCOUNTER — Inpatient Hospital Stay: Payer: Medicare PPO | Admitting: Anesthesiology

## 2022-05-12 ENCOUNTER — Inpatient Hospital Stay: Payer: Medicare PPO

## 2022-05-12 ENCOUNTER — Other Ambulatory Visit: Payer: Self-pay

## 2022-05-12 ENCOUNTER — Encounter: Payer: Self-pay | Admitting: Internal Medicine

## 2022-05-12 ENCOUNTER — Encounter
Admission: EM | Disposition: A | Discharge status: Home / Self Care | Payer: Self-pay | Source: Home / Self Care | Attending: Hospitalist

## 2022-05-12 DIAGNOSIS — S22088A Other fracture of T11-T12 vertebra, initial encounter for closed fracture: Secondary | ICD-10-CM | POA: Diagnosis not present

## 2022-05-12 DIAGNOSIS — I10 Essential (primary) hypertension: Secondary | ICD-10-CM | POA: Diagnosis not present

## 2022-05-12 DIAGNOSIS — W109XXA Fall (on) (from) unspecified stairs and steps, initial encounter: Secondary | ICD-10-CM | POA: Diagnosis not present

## 2022-05-12 DIAGNOSIS — M532X6 Spinal instabilities, lumbar region: Secondary | ICD-10-CM | POA: Diagnosis not present

## 2022-05-12 DIAGNOSIS — S32009A Unspecified fracture of unspecified lumbar vertebra, initial encounter for closed fracture: Secondary | ICD-10-CM | POA: Diagnosis present

## 2022-05-12 DIAGNOSIS — S32018A Other fracture of first lumbar vertebra, initial encounter for closed fracture: Secondary | ICD-10-CM | POA: Diagnosis not present

## 2022-05-12 DIAGNOSIS — S32010A Wedge compression fracture of first lumbar vertebra, initial encounter for closed fracture: Secondary | ICD-10-CM | POA: Diagnosis not present

## 2022-05-12 LAB — BASIC METABOLIC PANEL
Anion gap: 10 (ref 5–15)
BUN: 21 mg/dL (ref 8–23)
CO2: 21 mmol/L — ABNORMAL LOW (ref 22–32)
Calcium: 8.9 mg/dL (ref 8.9–10.3)
Chloride: 104 mmol/L (ref 98–111)
Creatinine, Ser: 1.43 mg/dL — ABNORMAL HIGH (ref 0.44–1.00)
GFR, Estimated: 40 mL/min — ABNORMAL LOW (ref >60)
Glucose, Bld: 161 mg/dL — ABNORMAL HIGH (ref 70–99)
Potassium: 4 mmol/L (ref 3.5–5.1)
Sodium: 135 mmol/L (ref 135–145)

## 2022-05-12 LAB — GLUCOSE, CAPILLARY
Glucose-Capillary: 132 mg/dL — ABNORMAL HIGH (ref 70–99)
Glucose-Capillary: 195 mg/dL — ABNORMAL HIGH (ref 70–99)
Glucose-Capillary: 213 mg/dL — ABNORMAL HIGH (ref 70–99)
Glucose-Capillary: 227 mg/dL — ABNORMAL HIGH (ref 70–99)
Glucose-Capillary: 191 mg/dL — ABNORMAL HIGH (ref 70–99)

## 2022-05-12 LAB — URINALYSIS, ROUTINE W REFLEX MICROSCOPIC
Bilirubin Urine: NEGATIVE
Glucose, UA: 50 mg/dL — AB
Hgb urine dipstick: NEGATIVE
Ketones, ur: 20 mg/dL — AB
Nitrite: NEGATIVE
Protein, ur: 30 mg/dL — AB
Specific Gravity, Urine: 1.016 (ref 1.005–1.030)
pH: 5 (ref 5.0–8.0)

## 2022-05-12 SURGERY — POSTERIOR THORACIC FUSION 4 LEVELS
Anesthesia: General | Site: Spine Thoracic

## 2022-05-12 MED ORDER — BUPIVACAINE HCL (PF) 0.5 % IJ SOLN
INTRAMUSCULAR | Status: AC
  Filled 2022-05-12: qty 60

## 2022-05-12 MED ORDER — PHENYLEPHRINE HCL (PRESSORS) 10 MG/ML IV SOLN
INTRAVENOUS | Status: DC | PRN
  Administered 2022-05-12 (×2): 80 ug via INTRAVENOUS
  Administered 2022-05-12: 160 ug via INTRAVENOUS
  Administered 2022-05-12: 80 ug via INTRAVENOUS
  Administered 2022-05-12: 160 ug via INTRAVENOUS

## 2022-05-12 MED ORDER — PROPOFOL 10 MG/ML IV BOLUS
INTRAVENOUS | Status: AC
  Filled 2022-05-12: qty 20

## 2022-05-12 MED ORDER — SODIUM CHLORIDE 0.9 % IV SOLN: 250.0000 mL | INTRAVENOUS | Status: DC

## 2022-05-12 MED ORDER — FENTANYL CITRATE (PF) 100 MCG/2ML IJ SOLN
INTRAMUSCULAR | Status: DC | PRN
  Administered 2022-05-12: 100 ug via INTRAVENOUS

## 2022-05-12 MED ORDER — VANCOMYCIN HCL IN DEXTROSE 1-5 GM/200ML-% IV SOLN
INTRAVENOUS | Status: AC
  Filled 2022-05-12: qty 200

## 2022-05-12 MED ORDER — FENTANYL CITRATE (PF) 100 MCG/2ML IJ SOLN
25.0000 ug | INTRAMUSCULAR | Status: DC | PRN
  Administered 2022-05-12 (×3): 25 ug via INTRAVENOUS

## 2022-05-12 MED ORDER — PROPOFOL 10 MG/ML IV BOLUS
INTRAVENOUS | Status: DC | PRN
  Administered 2022-05-12: 180 mg via INTRAVENOUS

## 2022-05-12 MED ORDER — SODIUM CHLORIDE FLUSH 0.9 % IV SOLN
INTRAVENOUS | Status: AC
  Filled 2022-05-12: qty 20

## 2022-05-12 MED ORDER — SUGAMMADEX SODIUM 200 MG/2ML IV SOLN
INTRAVENOUS | Status: DC | PRN
  Administered 2022-05-12: 240.4 mg via INTRAVENOUS

## 2022-05-12 MED ORDER — LIDOCAINE HCL (PF) 2 % IJ SOLN
INTRAMUSCULAR | Status: AC
  Filled 2022-05-12: qty 5

## 2022-05-12 MED ORDER — DEXTROSE 5 % IV SOLN
INTRAVENOUS | Status: DC | PRN
  Administered 2022-05-12: 3 g via INTRAVENOUS

## 2022-05-12 MED ORDER — SODIUM CHLORIDE 0.9% FLUSH: 3.0000 mL | INTRAVENOUS | Status: DC | PRN

## 2022-05-12 MED ORDER — SODIUM CHLORIDE (PF) 0.9 % IJ SOLN
INTRAMUSCULAR | Status: DC | PRN
  Administered 2022-05-12: 60 mL via INTRAMUSCULAR

## 2022-05-12 MED ORDER — ONDANSETRON HCL 4 MG/2ML IJ SOLN
INTRAMUSCULAR | Status: AC
  Filled 2022-05-12: qty 2

## 2022-05-12 MED ORDER — LACTATED RINGERS IV SOLN: INTRAVENOUS | Status: DC | PRN

## 2022-05-12 MED ORDER — DEXAMETHASONE SODIUM PHOSPHATE 10 MG/ML IJ SOLN
INTRAMUSCULAR | Status: DC | PRN
  Administered 2022-05-12: 6 mg via INTRAVENOUS

## 2022-05-12 MED ORDER — SUCCINYLCHOLINE CHLORIDE 200 MG/10ML IV SOSY
PREFILLED_SYRINGE | INTRAVENOUS | Status: DC | PRN
  Administered 2022-05-12: 140 mg via INTRAVENOUS

## 2022-05-12 MED ORDER — BUPIVACAINE-EPINEPHRINE (PF) 0.5% -1:200000 IJ SOLN
INTRAMUSCULAR | Status: DC | PRN
  Administered 2022-05-12: 10 mL

## 2022-05-12 MED ORDER — ROCURONIUM BROMIDE 10 MG/ML (PF) SYRINGE
PREFILLED_SYRINGE | INTRAVENOUS | Status: AC
  Filled 2022-05-12: qty 10

## 2022-05-12 MED ORDER — SODIUM CHLORIDE 0.9 % IV SOLN: INTRAVENOUS | Status: DC

## 2022-05-12 MED ORDER — MIRABEGRON ER 25 MG PO TB24
25.0000 mg | ORAL_TABLET | Freq: Every day | ORAL | Status: DC
  Administered 2022-05-12 – 2022-05-14 (×3): 25 mg via ORAL
  Filled 2022-05-12 (×4): qty 1

## 2022-05-12 MED ORDER — PROMETHAZINE HCL 25 MG/ML IJ SOLN: 6.2500 mg | INTRAMUSCULAR | Status: DC | PRN

## 2022-05-12 MED ORDER — ONDANSETRON HCL 4 MG/2ML IJ SOLN
INTRAMUSCULAR | Status: DC | PRN
  Administered 2022-05-12: 4 mg via INTRAVENOUS

## 2022-05-12 MED ORDER — KETAMINE HCL 50 MG/5ML IJ SOSY
PREFILLED_SYRINGE | INTRAMUSCULAR | Status: AC
  Filled 2022-05-12: qty 5

## 2022-05-12 MED ORDER — MIDAZOLAM HCL 2 MG/2ML IJ SOLN
INTRAMUSCULAR | Status: AC
  Filled 2022-05-12: qty 2

## 2022-05-12 MED ORDER — FENTANYL CITRATE (PF) 100 MCG/2ML IJ SOLN
INTRAMUSCULAR | Status: AC
  Filled 2022-05-12: qty 2

## 2022-05-12 MED ORDER — ACETAMINOPHEN 10 MG/ML IV SOLN: 1000.0000 mg | Freq: Once | INTRAVENOUS | Status: DC | PRN

## 2022-05-12 MED ORDER — PHENOL 1.4 % MT LIQD: 1.0000 | OROMUCOSAL | Status: DC | PRN

## 2022-05-12 MED ORDER — MENTHOL 3 MG MT LOZG: 1.0000 | LOZENGE | OROMUCOSAL | Status: DC | PRN

## 2022-05-12 MED ORDER — CEFAZOLIN SODIUM-DEXTROSE 2-4 GM/100ML-% IV SOLN
INTRAVENOUS | Status: AC
  Filled 2022-05-12: qty 100

## 2022-05-12 MED ORDER — PROPOFOL 1000 MG/100ML IV EMUL
INTRAVENOUS | Status: AC
  Filled 2022-05-12: qty 100

## 2022-05-12 MED ORDER — SUCCINYLCHOLINE CHLORIDE 200 MG/10ML IV SOSY
PREFILLED_SYRINGE | INTRAVENOUS | Status: AC
  Filled 2022-05-12: qty 10

## 2022-05-12 MED ORDER — OXYCODONE HCL 5 MG/5ML PO SOLN: 5.0000 mg | Freq: Once | ORAL | Status: AC | PRN

## 2022-05-12 MED ORDER — MIDAZOLAM HCL 2 MG/2ML IJ SOLN
INTRAMUSCULAR | Status: DC | PRN
  Administered 2022-05-12: 2 mg via INTRAVENOUS

## 2022-05-12 MED ORDER — BUPIVACAINE HCL 0.5 % IJ SOLN: INTRAMUSCULAR | Status: DC | PRN

## 2022-05-12 MED ORDER — VANCOMYCIN HCL 1000 MG IV SOLR
INTRAVENOUS | Status: DC | PRN
  Administered 2022-05-12: 1000 mg via INTRAVENOUS

## 2022-05-12 MED ORDER — DROPERIDOL 2.5 MG/ML IJ SOLN: 0.6250 mg | Freq: Once | INTRAMUSCULAR | Status: DC | PRN

## 2022-05-12 MED ORDER — PHENYLEPHRINE HCL-NACL 20-0.9 MG/250ML-% IV SOLN
INTRAVENOUS | Status: DC | PRN
  Administered 2022-05-12: 40 ug/min via INTRAVENOUS

## 2022-05-12 MED ORDER — BUPIVACAINE LIPOSOME 1.3 % IJ SUSP
INTRAMUSCULAR | Status: AC
  Filled 2022-05-12: qty 20

## 2022-05-12 MED ORDER — OXYCODONE HCL 5 MG PO TABS
ORAL_TABLET | ORAL | Status: AC
  Filled 2022-05-12: qty 1

## 2022-05-12 MED ORDER — ROCURONIUM BROMIDE 100 MG/10ML IV SOLN
INTRAVENOUS | Status: DC | PRN
  Administered 2022-05-12: 20 mg via INTRAVENOUS
  Administered 2022-05-12: 40 mg via INTRAVENOUS

## 2022-05-12 MED ORDER — LIDOCAINE HCL (CARDIAC) PF 100 MG/5ML IV SOSY
PREFILLED_SYRINGE | INTRAVENOUS | Status: DC | PRN
  Administered 2022-05-12: 100 mg via INTRAVENOUS

## 2022-05-12 MED ORDER — PHENYLEPHRINE 80 MCG/ML (10ML) SYRINGE FOR IV PUSH (FOR BLOOD PRESSURE SUPPORT)
PREFILLED_SYRINGE | INTRAVENOUS | Status: AC
  Filled 2022-05-12: qty 10

## 2022-05-12 MED ORDER — 0.9 % SODIUM CHLORIDE (POUR BTL) OPTIME
TOPICAL | Status: DC | PRN
  Administered 2022-05-12: 500 mL

## 2022-05-12 MED ORDER — SURGIFLO WITH THROMBIN (HEMOSTATIC MATRIX KIT) OPTIME
TOPICAL | Status: DC | PRN
  Administered 2022-05-12: 1 via TOPICAL

## 2022-05-12 MED ORDER — KETAMINE HCL 10 MG/ML IJ SOLN
INTRAMUSCULAR | Status: DC | PRN
  Administered 2022-05-12: 10 mg via INTRAVENOUS
  Administered 2022-05-12: 20 mg via INTRAVENOUS

## 2022-05-12 MED ORDER — OXYCODONE HCL 5 MG PO TABS
5.0000 mg | ORAL_TABLET | Freq: Once | ORAL | Status: AC | PRN
  Administered 2022-05-12: 5 mg via ORAL

## 2022-05-12 MED ORDER — EPINEPHRINE PF 1 MG/ML IJ SOLN
INTRAMUSCULAR | Status: AC
  Filled 2022-05-12: qty 1

## 2022-05-12 MED ORDER — HEPARIN SODIUM (PORCINE) 5000 UNIT/ML IJ SOLN
5000.0000 [IU] | Freq: Three times a day (TID) | INTRAMUSCULAR | Status: DC
  Administered 2022-05-13 – 2022-05-20 (×21): 5000 [IU] via SUBCUTANEOUS
  Filled 2022-05-12 (×22): qty 1

## 2022-05-12 MED ORDER — FENTANYL CITRATE (PF) 250 MCG/5ML IJ SOLN
INTRAMUSCULAR | Status: AC
  Filled 2022-05-12: qty 5

## 2022-05-12 MED ORDER — SODIUM CHLORIDE 0.9% FLUSH
3.0000 mL | Freq: Two times a day (BID) | INTRAVENOUS | Status: DC
  Administered 2022-05-12 – 2022-05-24 (×17): 3 mL via INTRAVENOUS

## 2022-05-12 MED ORDER — DEXAMETHASONE SODIUM PHOSPHATE 10 MG/ML IJ SOLN
INTRAMUSCULAR | Status: AC
  Filled 2022-05-12: qty 1

## 2022-05-12 MED ORDER — PHENYLEPHRINE HCL-NACL 20-0.9 MG/250ML-% IV SOLN
INTRAVENOUS | Status: AC
  Filled 2022-05-12: qty 250

## 2022-05-12 SURGICAL SUPPLY — 56 items
ALLOGRAFT BONE FIBER KORE 5 (Bone Implant) IMPLANT
BASIN KIT SINGLE STR (MISCELLANEOUS) ×1 IMPLANT
BUR NEURO DRILL SOFT 3.0X3.8M (BURR) ×1 IMPLANT
CNTNR URN SCR LID CUP LEK RST (MISCELLANEOUS) ×1 IMPLANT
CONT SPEC 4OZ STRL OR WHT (MISCELLANEOUS) ×1
CUP MEDICINE 2OZ PLAST GRAD ST (MISCELLANEOUS) ×1 IMPLANT
DERMABOND ADVANCED .7 DNX12 (GAUZE/BANDAGES/DRESSINGS) ×1 IMPLANT
DRAPE C ARM PK CFD 31 SPINE (DRAPES) ×1 IMPLANT
DRAPE LAPAROTOMY 100X77 ABD (DRAPES) ×1 IMPLANT
DRAPE MICROSCOPE SPINE 48X150 (DRAPES) ×1 IMPLANT
DRSG OPSITE POSTOP 3X4 (GAUZE/BANDAGES/DRESSINGS) IMPLANT
DRSG OPSITE POSTOP 4X6 (GAUZE/BANDAGES/DRESSINGS) IMPLANT
ELECT CAUTERY BLADE TIP 2.5 (TIP) ×2
ELECT EZSTD 165MM 6.5IN (MISCELLANEOUS)
ELECT REM PT RETURN 9FT ADLT (ELECTROSURGICAL) ×1
ELECTRODE CAUTERY BLDE TIP 2.5 (TIP) ×2 IMPLANT
ELECTRODE EZSTD 165MM 6.5IN (MISCELLANEOUS) IMPLANT
ELECTRODE REM PT RTRN 9FT ADLT (ELECTROSURGICAL) ×1 IMPLANT
GLOVE BIOGEL PI IND STRL 6.5 (GLOVE) ×1 IMPLANT
GLOVE BIOGEL PI IND STRL 8.5 (GLOVE) ×1 IMPLANT
GLOVE SURG SYN 6.5 ES PF (GLOVE) ×2 IMPLANT
GLOVE SURG SYN 6.5 PF PI (GLOVE) ×2 IMPLANT
GLOVE SURG SYN 8.5  E (GLOVE) ×4
GLOVE SURG SYN 8.5 E (GLOVE) ×4 IMPLANT
GLOVE SURG SYN 8.5 PF PI (GLOVE) ×3 IMPLANT
GOWN SRG LRG LVL 4 IMPRV REINF (GOWNS) ×1 IMPLANT
GOWN SRG XL LVL 3 NONREINFORCE (GOWNS) ×1 IMPLANT
GOWN STRL NON-REIN TWL XL LVL3 (GOWNS) ×1
GOWN STRL REIN LRG LVL4 (GOWNS) ×1
GUIDEWIRE NITINOL BEVEL TIP (WIRE) IMPLANT
HOLDER FOLEY CATH W/STRAP (MISCELLANEOUS) ×1 IMPLANT
KIT PREVENA INCISION MGT 13 (CANNISTER) ×1 IMPLANT
KIT SPINAL PRONEVIEW (KITS) ×1 IMPLANT
MANIFOLD NEPTUNE II (INSTRUMENTS) ×1 IMPLANT
MARKER SKIN DUAL TIP RULER LAB (MISCELLANEOUS) ×1 IMPLANT
NDL HYPO 22X1.5 SAFETY MO (MISCELLANEOUS) IMPLANT
NDL SAFETY ECLIP 18X1.5 (MISCELLANEOUS) ×1 IMPLANT
NEEDLE HYPO 22X1.5 SAFETY MO (MISCELLANEOUS) ×1 IMPLANT
NS IRRIG 500ML POUR BTL (IV SOLUTION) IMPLANT
PACK LAMINECTOMY NEURO (CUSTOM PROCEDURE TRAY) ×1 IMPLANT
PAD ARMBOARD 7.5X6 YLW CONV (MISCELLANEOUS) ×1 IMPLANT
ROD RELINE MAS LORD 5.5X150 (Rod) IMPLANT
SCREW LOCK RELINE 5.5 TULIP (Screw) IMPLANT
SCREW RELINE RED 6.5X50MM POLY (Screw) IMPLANT
SOLUTION IRRIG SURGIPHOR (IV SOLUTION) ×1 IMPLANT
STAPLER SKIN PROX 35W (STAPLE) IMPLANT
SURGIFLO W/THROMBIN 8M KIT (HEMOSTASIS) ×1 IMPLANT
SUT DVC VLOC 3-0 CL 6 P-12 (SUTURE) ×1 IMPLANT
SUT VIC AB 0 CT1 27 (SUTURE) ×3
SUT VIC AB 0 CT1 27XCR 8 STRN (SUTURE) ×2 IMPLANT
SUT VIC AB 2-0 CT1 18 (SUTURE) ×2 IMPLANT
SYR 10ML LL (SYRINGE) ×1 IMPLANT
SYR 30ML LL (SYRINGE) ×2 IMPLANT
TOWEL OR 17X26 4PK STRL BLUE (TOWEL DISPOSABLE) ×2 IMPLANT
TRAP FLUID SMOKE EVACUATOR (MISCELLANEOUS) ×1 IMPLANT
WATER STERILE IRR 1000ML POUR (IV SOLUTION) ×2 IMPLANT

## 2022-05-12 NOTE — Anesthesia Preprocedure Evaluation (Addendum)
Anesthesia Evaluation  Patient identified by MRN, date of birth, ID band Patient awake    Reviewed: Allergy & Precautions, H&P , NPO status , Patient's Chart, lab work & pertinent test results, reviewed documented beta blocker date and time   Airway Mallampati: III  TM Distance: >3 FB Neck ROM: full    Dental  (+) Teeth Intact   Pulmonary shortness of breath and with exertion, sleep apnea and Continuous Positive Airway Pressure Ventilation , Patient abstained from smoking.   Pulmonary exam normal        Cardiovascular Exercise Tolerance: Poor hypertension, On Medications Normal cardiovascular exam Rhythm:regular Rate:Normal     Neuro/Psych  PSYCHIATRIC DISORDERS      Compression fracture of L1 lumbar vertebra    GI/Hepatic Neg liver ROS, PUD,GERD  Medicated,,ulcerative colitis (s/p of colectomy and s/p ileostomy)   Endo/Other  diabetes, Well Controlled, Type 2, Oral Hypoglycemic Agents  Morbid obesity  Renal/GU Renal disease  negative genitourinary   Musculoskeletal   Abdominal  (+) + obese  Peds  Hematology  (+) Blood dyscrasia, anemia   Anesthesia Other Findings Past Medical History: No date: Anemia No date: Arthritis     Comment:  generalized adhesions) neg EGD No date: Chronic kidney disease No date: Colitis     Comment:  with colectomy No date: Diabetes mellitus without complication (HCC) No date: Dyspnea     Comment:  covid 01/23/2006: Gall stones 07/25/2014: Gastroenteritis     Comment:  hosp for IVF  No date: GERD (gastroesophageal reflux disease) No date: History of kidney stones No date: HTN (hypertension) No date: Hyperglycemia     Comment:  mild-monitors A1C No date: Obesity No date: Pneumonia No date: Rosacea No date: Sleep apnea     Comment:  CPAP everynight Past Surgical History: No date: CHOLECYSTECTOMY 1993: COLECTOMY     Comment:  has ileostomy 2021: COLON SURGERY     Comment:   removed rectum 08/1991: COLONOSCOPY 12/06/2011: COLOSTOMY REVISION     Comment:  Procedure: COLOSTOMY REVISION;  Surgeon: Romie Levee,               MD;  Location: [facility];  Service:               General;  Laterality: Right;  OSTOMY revision 12/06/2011: EXAMINATION UNDER ANESTHESIA     Comment:  Procedure: EXAM UNDER ANESTHESIA;  Surgeon: Romie Levee, MD;  Location: [facility] [place];                Service: General;  Laterality: N/A;  rectal exam under               anesthesia, anal dilation, pouchoscopy   05/2006: TOTAL ABDOMINAL HYSTERECTOMY     Comment:  for abscessed ovaries BMI    Body Mass Index: 39.33 kg/m     Reproductive/Obstetrics negative OB ROS                              Anesthesia Physical Anesthesia Plan  ASA: 3  Anesthesia Plan: General ETT   Post-op Pain Management: Tylenol PO (pre-op)*, Toradol IV (intra-op)* and Ketamine IV*   Induction: Intravenous and Rapid sequence  PONV Risk Score and Plan: 4 or greater and Ondansetron, Dexamethasone and Midazolam  Airway Management Planned: Oral ETT  Additional Equipment:   Intra-op Plan:   Post-operative  Plan: Extubation in OR  Informed Consent: I have reviewed the patients History and Physical, chart, labs and discussed the procedure including the risks, benefits and alternatives for the proposed anesthesia with the patient or authorized representative who has indicated his/her understanding and acceptance.     Dental Advisory Given  Plan Discussed with: CRNA  Anesthesia Plan Comments: (Pt took GLP-1 agonist Monday. Will provide RSI. Pt denies gastric retention symptoms and refuses a pre-op Ngtube. )         Anesthesia Quick Evaluation

## 2022-05-12 NOTE — Interval H&P Note (Signed)
History and Physical Interval Note:  05/12/2022 7:57 AM  Jamie [name]  has presented today for surgery, with the diagnosis of L1 fracture, spinal instability.  The various methods of treatment have been discussed with the patient and family. After consideration of risks, benefits and other options for treatment, the patient has consented to  Procedure(s) with comments: POSTERIOR THORACIC FUSION 4 LEVELS (N/A) - T11-L3 PSF as a surgical intervention.  The patient's history has been reviewed, patient examined, no change in status, stable for surgery.  I have reviewed the patient's chart and labs.  Questions were answered to the patient's satisfaction.    Heart sounds normal no MRG. Chest Clear to Auscultation Bilaterally.   Brissa Asante

## 2022-05-12 NOTE — Op Note (Signed)
Indications: Mrs. Cormier is a 67 year old female who fell down the stairs and suffered an unstable 3 column L1 fracture as well as fracture at the T12 vertebral body.  This was a severe fracture that led to lumbar spinal instability.  Due to the unstable nature of this fracture, surgical intervention was recommended.  Findings: Open reduction internal fixation of L1 and T12 fractures  Preoperative Diagnosis: Unstable L1 fracture, 3 column spinal injury, lumbar spinal instability Postoperative Diagnosis: same   EBL: 25 ml IVF: 500 ml Drains: none Disposition: Extubated and Stable to PACU Complications: none  No foley catheter was placed.   Preoperative Note:   Risks of surgery discussed include: infection, bleeding, stroke, coma, death, paralysis, CSF leak, nerve/spinal cord injury, numbness, tingling, weakness, complex regional pain syndrome, recurrent stenosis and/or disc herniation, vascular injury, development of instability, neck/back pain, need for further surgery, persistent symptoms, development of deformity, and the risks of anesthesia. The patient understood these risks and agreed to proceed.  Operative Note:  1.  T11-L3 posterior instrumentation 2.  T11-L3 posterior arthrodesis 3.  Use of stereotaxis 4.  Open reduction internal fixation of T12 and L1 fractures  The patient was brought to the Operating Room, intubated and turned into the prone position. All pressure points were checked and double checked. The patient was prepped and draped in the standard fashion. A full timeout was performed. Preoperative antibiotics were given.   After draping, the stereotactic array was placed.  Stereotactic imaging was acquired and registered to the [facility] system.  The image guidance system was used to choose bilateral [place] incisions. The incisions were injected with local anesthetic.  Each incision was opened with a scalpel, bovie electrocautery was used to open the fascia.  Using  stereotactic guidance, each pedicle from L2-L3 and T11-T12 was cannulated and K wire secured.  We then placed pedicle screws over each K wire. 6.5x30mm Nuvasive Reline screws were used bilaterally at each level.  After placement of pedicle screws, rods measured and placed. The rods were secured using locking caps to manufacturer's specifications.   By reducing the screws to the rod, the L1 fracture was reduced.  Thus, open reduction and internal fixation was achieved.  CT scan was taken to confirm instrumentation placement. The wound was copiously irrigated, then the posterior elements were prepared for arthrodesis.  Demineralized bone graft was placed over the posterior elements to aid in arthrodesis from T11-L3.  After hemostasis, the wound was closed in layers with 0 and 2-0 vicryl. 3-0 monocryl and dermabond was applied to the incision. A sterile dressing was placed.  The patient was then flipped supine and extubated with incident. All counts were correct times 2 at the end of the case. No immediate complications were noted.  Manning Charity PA assisted in the entire procedure. An assistant was required for this procedure due to the complexity.  The assistant provided assistance in tissue manipulation and suction, and was required for the successful and safe performance of the procedure. I performed the critical portions of the procedure.   Venetia Night MD

## 2022-05-12 NOTE — Anesthesia Procedure Notes (Signed)
Procedure Name: Intubation Date/Time: 05/12/2022 8:22 AM  Performed by: Emeterio Reeve, CRNAPre-anesthesia Checklist: Patient identified, Emergency Drugs available, Suction available and Patient being monitored Patient Re-evaluated:Patient Re-evaluated prior to induction Oxygen Delivery Method: Circle system utilized Preoxygenation: Pre-oxygenation with 100% oxygen Induction Type: IV induction Laryngoscope Size: McGraph and 4 Grade View: Grade I Tube type: Oral Tube size: 7.0 mm Number of attempts: 1 Airway Equipment and Method: Stylet and Oral airway Placement Confirmation: ETT inserted through vocal cords under direct vision, positive ETCO2 and breath sounds checked- equal and bilateral Secured at: 22 cm Tube secured with: Tape Dental Injury: Teeth and Oropharynx as per pre-operative assessment  Comments: RSI, C-spine neutrality maintained throughout. CA

## 2022-05-12 NOTE — Transfer of Care (Signed)
Immediate Anesthesia Transfer of Care Note  Patient: Jamie [name]  Procedure(s) Performed: POSTERIOR THORACIC FUSION 4 LEVELS (Spine Thoracic)  Patient Location: PACU  Anesthesia Type:General  Level of Consciousness: awake, drowsy, and patient cooperative  Airway & Oxygen Therapy: Patient Spontanous Breathing and Patient connected to face mask oxygen  Post-op Assessment: Report given to RN and Post -op Vital signs reviewed and stable  Post vital signs: stable  Last Vitals:  Vitals Value Taken Time  BP 110/42 05/12/22 1050  Temp    Pulse 109 05/12/22 1054  Resp 24 05/12/22 1054  SpO2 100 % 05/12/22 1054  Vitals shown include unvalidated device data.  Last Pain:  Vitals:   05/12/22 0701  TempSrc: Temporal  PainSc: 3       Patients Stated Pain Goal: 0 (05/12/22 0701)  Complications: No notable events documented.

## 2022-05-12 NOTE — Progress Notes (Signed)
PROGRESS NOTE    Jamie [name]  EXB:[id number] DOB: 25-Jul-1955 DOA: 05/10/2022 PCP: Judy Pimple, MD   Assessment & Plan:   Principal Problem:   Compression fracture of L1 lumbar vertebra Active Problems:   Fall at home, initial encounter   Essential hypertension   Ulcerative colitis   DM (diabetes mellitus), type 2 with renal complications   Hyperlipidemia associated with type 2 diabetes mellitus   Chronic kidney disease, stage 3a   Leukocytosis   OSA (obstructive sleep apnea)   Morbid obesity  Assessment and Plan:  Compression fracture of L1 lumbar vertebra: s/p open reduction internal fixation of T12 & L1 fractures & T-11-L3 posterior arthrodesis as per neuro surg. Percocet, morphine prn for pain. Continue w/ lidocaine patch. Neuro surg following and recs apprec   Fall: at home. Fall precautions    HTN: continue on metoprolol. Holding losartan secondary to worsening renal function   Ulcerative colitis: s/p of colectomy and ileostomy. No acute issues    DM2: Recent hbA1c 6.4, well-controlled. Continue on SSI w/ accuchecks    HLD: continue on statin    CKDIIIa: Cr is trending down from day prior. Continue on IVFs. Holding losartan      Leukocytosis: likely reasctive   OSA: continue on home BiPAP.   Morbid obesity: BMI 43.4. Complicates overall care & prognosis        DVT prophylaxis: SCDs, heparin SQ (restart on 05/13/22 as per neuro surg) Code Status: full  Family Communication: discussed pt's care w/ pt's family at bedside and answered his questions  Disposition Plan: depends on PT/OT recs  Level of care: Med-Surg  Status is: Inpatient Remains inpatient appropriate because: severity of illness, s/p surgery today     Consultants:  Neuro surg   Procedures:   Antimicrobials:  Subjective: Pt c/o back pain   Objective: Vitals:   05/11/22 1628 05/11/22 1840 05/12/22 0044 05/12/22 0701  BP:  (!) 144/50 (!) 152/52 (!) 138/98  Pulse:  95 (!) 110  (!) 102  Resp:  18 16 16   Temp: 98.3 F (36.8 C) 98.3 F (36.8 C) 97.6 F (36.4 C) 98.3 F (36.8 C)  TempSrc: Oral Oral  Temporal  SpO2:  98% 97% 96%  Weight: 120.2 kg   120.2 kg  Height:    5' 5.5" (1.664 m)    Intake/Output Summary (Last 24 hours) at 05/12/2022 0909 Last data filed at 05/12/2022 0844 Gross per 24 hour  Intake 899.14 ml  Output 1401 ml  Net -501.86 ml   Filed Weights   05/11/22 1628 05/12/22 0701  Weight: 120.2 kg 120.2 kg    Examination:  General exam: Appears calm and comfortable  Respiratory system: Clear to auscultation. Respiratory effort normal. Cardiovascular system: S1 & S2+. No  rubs, gallops or clicks. Gastrointestinal system: Abdomen is obese, soft and nontender.  Normal bowel sounds heard. Central nervous system: Alert and oriented. Moves all extremities  Psychiatry: Judgement and insight appear normal. Mood & affect appropriate.     Data Reviewed: I have personally reviewed following labs and imaging studies  CBC: Recent Labs  Lab 05/11/22 1144  WBC 13.5*  HGB 13.8  HCT 42.2  MCV 97.5  PLT 223   Basic Metabolic Panel: Recent Labs  Lab 05/11/22 1144 05/12/22 0424  NA 133* 135  K 4.6 4.0  CL 103 104  CO2 20* 21*  GLUCOSE 167* 161*  BUN 23 21  CREATININE 1.82* 1.43*  CALCIUM 9.2 8.9   GFR: Estimated Creatinine  Clearance: 50 mL/min (A) (by C-G formula based on SCr of 1.43 mg/dL (H)). Liver Function Tests: No results for input(s): "AST", "ALT", "ALKPHOS", "BILITOT", "PROT", "ALBUMIN" in the last 168 hours. No results for input(s): "LIPASE", "AMYLASE" in the last 168 hours. No results for input(s): "AMMONIA" in the last 168 hours. Coagulation Profile: Recent Labs  Lab 05/11/22 1745  INR 1.1   Cardiac Enzymes: No results for input(s): "CKTOTAL", "CKMB", "CKMBINDEX", "TROPONINI" in the last 168 hours. BNP (last 3 results) No results for input(s): "PROBNP" in the last 8760 hours. HbA1C: No results for input(s):  "HGBA1C" in the last 72 hours. CBG: Recent Labs  Lab 05/11/22 1208 05/11/22 1656 05/11/22 2111 05/12/22 0655  GLUCAP 150* 153* 134* 132*   Lipid Profile: No results for input(s): "CHOL", "HDL", "LDLCALC", "TRIG", "CHOLHDL", "LDLDIRECT" in the last 72 hours. Thyroid Function Tests: No results for input(s): "TSH", "T4TOTAL", "FREET4", "T3FREE", "THYROIDAB" in the last 72 hours. Anemia Panel: No results for input(s): "VITAMINB12", "FOLATE", "FERRITIN", "TIBC", "IRON", "RETICCTPCT" in the last 72 hours. Sepsis Labs: No results for input(s): "PROCALCITON", "LATICACIDVEN" in the last 168 hours.  No results found for this or any previous visit (from the past 240 hour(s)).       Radiology Studies: CT HEAD WO CONTRAST ( )  Result Date: 05/11/2022 CLINICAL DATA:  Fall EXAM: CT HEAD WITHOUT CONTRAST CT CERVICAL SPINE WITHOUT CONTRAST TECHNIQUE: Multidetector CT imaging of the head and cervical spine was performed following the standard protocol without intravenous contrast. Multiplanar CT image reconstructions of the cervical spine were also generated. RADIATION DOSE REDUCTION: This exam was performed according to the departmental dose-optimization program which includes automated exposure control, adjustment of the mA and/or kV according to patient size and/or use of iterative reconstruction technique. COMPARISON:  CT head 01/09/2014, no prior CT cervical spine FINDINGS: CT HEAD FINDINGS Brain: No evidence of acute infarct, hemorrhage, mass, mass effect, or midline shift. No hydrocephalus or extra-axial fluid collection. Vascular: No hyperdense vessel. Skull: Negative for fracture or focal lesion. Sinuses/Orbits: No acute finding. Other: The mastoid air cells are well aerated. CT CERVICAL SPINE FINDINGS Alignment: No traumatic listhesis. Straightening of the normal cervical lordosis. Skull base and vertebrae: Evaluation of the lower cervical spine is somewhat limited by artifact related to  overlapping soft tissues. Within this limitation, no acute fracture. No primary bone lesion or focal pathologic process. Prominent anterior osteophytes at C1 and C3-C6, which indents on the posterior aspect of the pharynx. Soft tissues and spinal canal: No prevertebral fluid or swelling. No visible canal hematoma. Disc levels: Degenerative changes in the cervical spine. No significant spinal canal stenosis. Upper chest: Negative. IMPRESSION: 1. No acute intracranial process. 2. No acute fracture or traumatic listhesis in the cervical spine. Electronically Signed   By: Wiliam Ke M.D.   On: 05/11/2022 11:56   CT CERVICAL SPINE WO CONTRAST  Result Date: 05/11/2022 CLINICAL DATA:  Fall EXAM: CT HEAD WITHOUT CONTRAST CT CERVICAL SPINE WITHOUT CONTRAST TECHNIQUE: Multidetector CT imaging of the head and cervical spine was performed following the standard protocol without intravenous contrast. Multiplanar CT image reconstructions of the cervical spine were also generated. RADIATION DOSE REDUCTION: This exam was performed according to the departmental dose-optimization program which includes automated exposure control, adjustment of the mA and/or kV according to patient size and/or use of iterative reconstruction technique. COMPARISON:  CT head 01/09/2014, no prior CT cervical spine FINDINGS: CT HEAD FINDINGS Brain: No evidence of acute infarct, hemorrhage, mass, mass effect, or midline shift.  No hydrocephalus or extra-axial fluid collection. Vascular: No hyperdense vessel. Skull: Negative for fracture or focal lesion. Sinuses/Orbits: No acute finding. Other: The mastoid air cells are well aerated. CT CERVICAL SPINE FINDINGS Alignment: No traumatic listhesis. Straightening of the normal cervical lordosis. Skull base and vertebrae: Evaluation of the lower cervical spine is somewhat limited by artifact related to overlapping soft tissues. Within this limitation, no acute fracture. No primary bone lesion or focal  pathologic process. Prominent anterior osteophytes at C1 and C3-C6, which indents on the posterior aspect of the pharynx. Soft tissues and spinal canal: No prevertebral fluid or swelling. No visible canal hematoma. Disc levels: Degenerative changes in the cervical spine. No significant spinal canal stenosis. Upper chest: Negative. IMPRESSION: 1. No acute intracranial process. 2. No acute fracture or traumatic listhesis in the cervical spine. Electronically Signed   By: Wiliam Ke M.D.   On: 05/11/2022 11:56   MR LUMBAR SPINE WO CONTRAST  Result Date: 05/11/2022 CLINICAL DATA:  Low back pain, trauma.  Fall. EXAM: MRI LUMBAR SPINE WITHOUT CONTRAST TECHNIQUE: Multiplanar, multisequence MR imaging of the lumbar spine was performed. No intravenous contrast was administered. COMPARISON:  Lumbar spine CT 05/10/2022 FINDINGS: Segmentation:  Standard. Alignment:  Trace anterolisthesis of L4 on L5. Vertebrae: Has shown on CT, there is an acute transverse fracture through the superior aspect of the L1 vertebral body which extends through the posterior elements bilaterally (pedicles and superior articular processes) without significant displacement. There is only at most subtle anterior compression of the L1 vertebral body. No other lumbar fracture is identified. Conus medullaris and cauda equina: Conus extends to the upper L1 level. Conus and cauda equina appear normal. Paraspinal and other soft tissues: Unremarkable. Disc levels: Disc desiccation throughout the lumbar spine with largely preserved disc space heights. T12-L1: Mild facet hypertrophy without disc herniation or stenosis. L1-2: Mild disc bulging and mild facet hypertrophy without stenosis. L2-3: Mild disc bulging and moderate facet hypertrophy result in borderline to mild right lateral recess stenosis and moderate right neural foraminal stenosis without spinal stenosis. L3-4: Disc bulging and severe facet hypertrophy result in mild spinal stenosis, mild  bilateral lateral recess stenosis, and mild right neural foraminal stenosis. L4-5: Anterolisthesis with mild bulging of uncovered disc and severe facet hypertrophy result in moderate spinal stenosis, moderate bilateral lateral recess stenosis, and minimal to mild right and mild-to-moderate left neural foraminal stenosis. L5-S1: Minimal disc bulging and moderate to severe right and mild-to-moderate left facet hypertrophy without significant stenosis. IMPRESSION: 1. Acute nondisplaced transverse fracture through the superior aspect of the L1 vertebral body and posterior elements. 2. Multilevel lumbar disc and facet degeneration, most notable at L4-5 where there is moderate spinal stenosis. Electronically Signed   By: Sebastian Ache M.D.   On: 05/11/2022 11:34   MR THORACIC SPINE WO CONTRAST  Result Date: 05/11/2022 CLINICAL DATA:  Back trauma, abnormal neuro exam, CT or xray positive (Age >= 16y). Fall. EXAM: MRI THORACIC SPINE WITHOUT CONTRAST TECHNIQUE: Multiplanar, multisequence MR imaging of the thoracic spine was performed. No intravenous contrast was administered. COMPARISON:  Thoracic spine CT 05/10/2022 FINDINGS: Alignment:  Normal. Vertebrae: Minimal STIR hyperintensity/edema along the C7 and T8 superior endplates without a definite acute fracture when correlating with CT. Partial defect in bridging anterior osteophytes/syndesmophytes at T7-8 appears corticated/chronic on CT. No suspicious marrow lesion. Cord:  Normal signal and morphology. Paraspinal and other soft tissues: Unremarkable. Disc levels: Mild diffuse thoracic spondylosis and facet arthrosis without significant stenosis. IMPRESSION: Minimal edema type signal along  the C7 and T8 superior endplates without a definite acute fracture when correlating with CT. Electronically Signed   By: Sebastian Ache M.D.   On: 05/11/2022 11:20   CT Thoracic Spine Wo Contrast  Result Date: 05/11/2022 CLINICAL DATA:  Mid and lower back pain following a fall onto  her back down an unknown number of steps at church today. Age-indeterminate L1 compression fracture noted on thoracic spine series today. EXAM: CT THORACIC AND LUMBAR SPINE WITHOUT CONTRAST TECHNIQUE: Multidetector CT imaging of the thoracic and lumbar spine was performed without intravenous contrast. Multiplanar CT image reconstructions were also generated. RADIATION DOSE REDUCTION: This exam was performed according to the departmental dose-optimization program which includes automated exposure control, adjustment of the mA and/or kV according to patient size and/or use of iterative reconstruction technique. COMPARISON:  Thoracic spine series today, CT abdomen and pelvis and reconstructed images 05/30/21. FINDINGS: CT THORACIC SPINE FINDINGS Segmentation: There are 12 rib-bearing thoracic segments, with osteopenia. There is a rudimentary cervical rib on the left at C7. Alignment: Within normal limits. Vertebrae: No acute fracture is evident. The spinous processes abut in the midline at most thoracic levels. There is intact bridging enthesopathy compatible with DISH throughout, with degenerative discs with scattered disc space calcifications in the lower thoracic spine. Most of the posteromedial ribs are fused to the vertebrae and some are also fused to both the vertebrae and the transverse processes. Paraspinal and other soft tissues: There is no paraspinal hematoma or soft tissue injection. No canal hematoma is seen. There is cardiomegaly with coronary artery and aortic atherosclerosis. No pneumothorax is seen the visualized chest. Visualized lungs are clear as well as can be seen accounting for respiratory motion. Disc levels: The thoracic discs are degenerated particularly in the lower half of the thoracic spine. There are mild features of spondylosis. There are mild posterior disc osteophyte complexes at T6-7, T7-8, T8-9, T9-10, T10-11, and T11-12 but no mass effect on the cord surface or herniated discs are  seen. There are multilevel facet spurring changes but no significant foraminal encroachment in the thoracic spine. CT LUMBAR SPINE FINDINGS Segmentation: 5 lumbar type vertebrae. Alignment: Normal except for minimal grade 1 degenerative anterolisthesis at L4-5, stable. Vertebrae: Osteopenia. There is an acute superior endplate anterior wedge compression fracture of L1 vertebral body. There is no posterior cortical retropulsion. Loss of anterior height is 25%.  No posterior height loss is seen. There is fragmentation along the anterior superior L1 body, and a transverse nondisplaced fracture line extending through the superior L1 body. The fracture continues posteriorly through both pedicles, and continuing dorsally through the superior articular facets of L1 and on the right also passes through the adjacent inferior articular facet of T12, with ankylosis of the facet joints previously noted. Other vertebrae do not show evidence of fractures. There are bridging osteophytes all levels except for L4-5, where there is attempted bridging. Paraspinal and other soft tissues: Aortic atherosclerosis. No paraspinal hematoma. Nonobstructive nephrolithiasis on the left. Cluster of extruded gallstones are Morrison's pouch status post cholecystectomy. Coarse chronic presacral stranding changes. Disc levels: There is partial disc space loss once again at T12-L1 and L1-2. There are posterior disc osteophyte complexes at both levels without canal hematoma or significant spinal stenosis. At L1-2 posterior disc osteophyte complex again partially effaces the ventral CSF and there is mild facet hypertrophy without foraminal stenosis. L2-3, right-greater-than-left moderate facet hypertrophy is seen with encroachment on the right subarticular recess and mild spinal canal stenosis due to posterior osteophytes.  Due to facet hypertrophy there is moderate severe right and mild-to-moderate left foraminal stenosis. L3-4 demonstrating  moderate-to-severe dorsal facet hypertrophy and a posterior disc osteophyte complex and ligamentous thickening causing mild spinal canal encroachment. There is mild-to-moderate foraminal stenosis. More advanced facet hypertrophy L4-5 is seen with moderate to severe acquired spinal stenosis due to ligamentous and facet hypertrophy and dorsal disc osteophyte complex. There is bilateral mild-to-moderate foraminal stenosis. At L5-S1, moderate facet osteophytes and posterior disc osteophyte complex causing mild compression of the S1 nerve roots. There is moderate foraminal stenosis. IMPRESSION: 1. Osteopenia and degenerative change thoracic spine without evidence of thoracic spine fractures or malalignment. 2. Cardiomegaly with aortic and coronary artery atherosclerosis. 3. Acute superior endplate anterior wedge compression fracture of L1 with 25% loss of anterior height but no posterior cortical retropulsion or paraspinal hematoma. 4. Transverse nondisplaced fracture through the superior L1 body extends through both pedicles and dorsally through the superior articular facet of L1 and on the right also through the inferior articular facet of T12, with ankylosis of the facet joints seen previously. The posterior fractures are also nondisplaced. 5. Osteopenia and degenerative change without further appreciable lumbar spine fractures. 6. Nonobstructive left nephrolithiasis. 7. Cluster of extruded gallstones in Morison's pouch. Status post cholecystectomy. Aortic Atherosclerosis (ICD10-I70.0). Electronically Signed   By: Almira Bar M.D.   On: 05/11/2022 00:09   CT Lumbar Spine Wo Contrast  Result Date: 05/11/2022 CLINICAL DATA:  Mid and lower back pain following a fall onto her back down an unknown number of steps at church today. Age-indeterminate L1 compression fracture noted on thoracic spine series today. EXAM: CT THORACIC AND LUMBAR SPINE WITHOUT CONTRAST TECHNIQUE: Multidetector CT imaging of the thoracic and  lumbar spine was performed without intravenous contrast. Multiplanar CT image reconstructions were also generated. RADIATION DOSE REDUCTION: This exam was performed according to the departmental dose-optimization program which includes automated exposure control, adjustment of the mA and/or kV according to patient size and/or use of iterative reconstruction technique. COMPARISON:  Thoracic spine series today, CT abdomen and pelvis and reconstructed images 05/30/21. FINDINGS: CT THORACIC SPINE FINDINGS Segmentation: There are 12 rib-bearing thoracic segments, with osteopenia. There is a rudimentary cervical rib on the left at C7. Alignment: Within normal limits. Vertebrae: No acute fracture is evident. The spinous processes abut in the midline at most thoracic levels. There is intact bridging enthesopathy compatible with DISH throughout, with degenerative discs with scattered disc space calcifications in the lower thoracic spine. Most of the posteromedial ribs are fused to the vertebrae and some are also fused to both the vertebrae and the transverse processes. Paraspinal and other soft tissues: There is no paraspinal hematoma or soft tissue injection. No canal hematoma is seen. There is cardiomegaly with coronary artery and aortic atherosclerosis. No pneumothorax is seen the visualized chest. Visualized lungs are clear as well as can be seen accounting for respiratory motion. Disc levels: The thoracic discs are degenerated particularly in the lower half of the thoracic spine. There are mild features of spondylosis. There are mild posterior disc osteophyte complexes at T6-7, T7-8, T8-9, T9-10, T10-11, and T11-12 but no mass effect on the cord surface or herniated discs are seen. There are multilevel facet spurring changes but no significant foraminal encroachment in the thoracic spine. CT LUMBAR SPINE FINDINGS Segmentation: 5 lumbar type vertebrae. Alignment: Normal except for minimal grade 1 degenerative  anterolisthesis at L4-5, stable. Vertebrae: Osteopenia. There is an acute superior endplate anterior wedge compression fracture of L1 vertebral body. There  is no posterior cortical retropulsion. Loss of anterior height is 25%.  No posterior height loss is seen. There is fragmentation along the anterior superior L1 body, and a transverse nondisplaced fracture line extending through the superior L1 body. The fracture continues posteriorly through both pedicles, and continuing dorsally through the superior articular facets of L1 and on the right also passes through the adjacent inferior articular facet of T12, with ankylosis of the facet joints previously noted. Other vertebrae do not show evidence of fractures. There are bridging osteophytes all levels except for L4-5, where there is attempted bridging. Paraspinal and other soft tissues: Aortic atherosclerosis. No paraspinal hematoma. Nonobstructive nephrolithiasis on the left. Cluster of extruded gallstones are Morrison's pouch status post cholecystectomy. Coarse chronic presacral stranding changes. Disc levels: There is partial disc space loss once again at T12-L1 and L1-2. There are posterior disc osteophyte complexes at both levels without canal hematoma or significant spinal stenosis. At L1-2 posterior disc osteophyte complex again partially effaces the ventral CSF and there is mild facet hypertrophy without foraminal stenosis. L2-3, right-greater-than-left moderate facet hypertrophy is seen with encroachment on the right subarticular recess and mild spinal canal stenosis due to posterior osteophytes. Due to facet hypertrophy there is moderate severe right and mild-to-moderate left foraminal stenosis. L3-4 demonstrating moderate-to-severe dorsal facet hypertrophy and a posterior disc osteophyte complex and ligamentous thickening causing mild spinal canal encroachment. There is mild-to-moderate foraminal stenosis. More advanced facet hypertrophy L4-5 is seen with  moderate to severe acquired spinal stenosis due to ligamentous and facet hypertrophy and dorsal disc osteophyte complex. There is bilateral mild-to-moderate foraminal stenosis. At L5-S1, moderate facet osteophytes and posterior disc osteophyte complex causing mild compression of the S1 nerve roots. There is moderate foraminal stenosis. IMPRESSION: 1. Osteopenia and degenerative change thoracic spine without evidence of thoracic spine fractures or malalignment. 2. Cardiomegaly with aortic and coronary artery atherosclerosis. 3. Acute superior endplate anterior wedge compression fracture of L1 with 25% loss of anterior height but no posterior cortical retropulsion or paraspinal hematoma. 4. Transverse nondisplaced fracture through the superior L1 body extends through both pedicles and dorsally through the superior articular facet of L1 and on the right also through the inferior articular facet of T12, with ankylosis of the facet joints seen previously. The posterior fractures are also nondisplaced. 5. Osteopenia and degenerative change without further appreciable lumbar spine fractures. 6. Nonobstructive left nephrolithiasis. 7. Cluster of extruded gallstones in Morison's pouch. Status post cholecystectomy. Aortic Atherosclerosis (ICD10-I70.0). Electronically Signed   By: Almira Bar M.D.   On: 05/11/2022 00:09   DG Thoracic Spine 2 View  Result Date: 05/10/2022 CLINICAL DATA:  pain EXAM: THORACIC SPINE 2 VIEWS COMPARISON:  CT abdomen pelvis 05/30/2021. FINDINGS: There is no evidence of thoracic spine fracture. Interval development of an age-indeterminate L1 anterior wedge compression fracture. Multilevel mild degenerative changes spine. No other significant bone abnormalities are identified. IMPRESSION: 1. No acute displaced fracture or traumatic listhesis of the thoracic spine. 2. Interval development of an age-indeterminate L1 anterior wedge compression fracture. Recommend correlation with point tenderness  to palpation to evaluate for an acute component. Electronically Signed   By: Tish Frederickson M.D.   On: 05/10/2022 21:42        Scheduled Meds:  [MAR Hold] atorvastatin  5 mg Oral QODAY   [MAR Hold] cholecalciferol  1,000 Units Oral Daily   [MAR Hold] fluticasone  2 spray Each Nare QHS   [MAR Hold] heparin  5,000 Units Subcutaneous Q8H   [MAR Hold] insulin  aspart  0-5 Units Subcutaneous QHS   [MAR Hold] insulin aspart  0-9 Units Subcutaneous TID WC   [MAR Hold] lidocaine  1 patch Transdermal Q24H   [MAR Hold] metoprolol tartrate  25 mg Oral Daily   [MAR Hold] multivitamin with minerals  1 tablet Oral Daily   [MAR Hold] pantoprazole  40 mg Oral Daily   Continuous Infusions:   LOS: 1 day    Time spent: 35 mins     Charise Killian, MD [facility] Pager [phone]  If 7PM-7AM, please contact night-coverage [email or web address] 05/12/2022, 9:09 AM

## 2022-05-13 DIAGNOSIS — I10 Essential (primary) hypertension: Secondary | ICD-10-CM | POA: Diagnosis not present

## 2022-05-13 DIAGNOSIS — S32010A Wedge compression fracture of first lumbar vertebra, initial encounter for closed fracture: Secondary | ICD-10-CM | POA: Diagnosis not present

## 2022-05-13 LAB — BASIC METABOLIC PANEL
Anion gap: 11 (ref 5–15)
BUN: 16 mg/dL (ref 8–23)
CO2: 20 mmol/L — ABNORMAL LOW (ref 22–32)
Calcium: 8.4 mg/dL — ABNORMAL LOW (ref 8.9–10.3)
Chloride: 106 mmol/L (ref 98–111)
Creatinine, Ser: 1.22 mg/dL — ABNORMAL HIGH (ref 0.44–1.00)
GFR, Estimated: 49 mL/min — ABNORMAL LOW (ref >60)
Glucose, Bld: 174 mg/dL — ABNORMAL HIGH (ref 70–99)
Potassium: 4.4 mmol/L (ref 3.5–5.1)
Sodium: 137 mmol/L (ref 135–145)

## 2022-05-13 LAB — TYPE AND SCREEN

## 2022-05-13 LAB — GLUCOSE, CAPILLARY
Glucose-Capillary: 212 mg/dL — ABNORMAL HIGH (ref 70–99)
Glucose-Capillary: 250 mg/dL — ABNORMAL HIGH (ref 70–99)
Glucose-Capillary: 181 mg/dL — ABNORMAL HIGH (ref 70–99)
Glucose-Capillary: 246 mg/dL — ABNORMAL HIGH (ref 70–99)

## 2022-05-13 LAB — CBC
HCT: 37.6 % (ref 36.0–46.0)
Hemoglobin: 12.4 g/dL (ref 12.0–15.0)
MCH: 32.5 pg (ref 26.0–34.0)
MCHC: 33 g/dL (ref 30.0–36.0)
MCV: 98.4 fL (ref 80.0–100.0)
Platelets: 177 10*3/uL (ref 150–400)
RBC: 3.82 MIL/uL — ABNORMAL LOW (ref 3.87–5.11)
RDW: 13.2 % (ref 11.5–15.5)
WBC: 15.7 10*3/uL — ABNORMAL HIGH (ref 4.0–10.5)
nRBC: 0 % (ref 0.0–0.2)

## 2022-05-13 LAB — BPAM RBC: Blood Product Expiration Date: 202405162359

## 2022-05-13 MED ORDER — LOSARTAN POTASSIUM 25 MG PO TABS
25.0000 mg | ORAL_TABLET | Freq: Every day | ORAL | Status: DC
  Administered 2022-05-13 – 2022-05-14 (×2): 25 mg via ORAL
  Filled 2022-05-13 (×2): qty 1

## 2022-05-13 NOTE — Progress Notes (Signed)
Attending Progress Note  History: RANDALL RAMPERSAD is here for an unstable L1 3 column fracture.  Chance type.  Status post percutaneous fusion.  Physical Exam: Vitals:   05/12/22 2317 05/13/22 0537  BP: (!) 142/45 (!) 155/63  Pulse: 92 91  Resp: 20 20  Temp: 99.4 F (37.4 C) 98.5 F (36.9 C)  SpO2: 95% 98%    AA Ox3 CNI  Strength:5/5 throughout   Dressings in place  Data:  Recent Labs  Lab 05/11/22 1144 05/12/22 0424 05/13/22 0500  NA 133* 135 137  K 4.6 4.0 4.4  CL 103 104 106  CO2 20* 21* 20*  BUN CREATININE 1.82* 1.43* 1.22*  GLUCOSE 167* 161* 174*  CALCIUM 9.2 8.9 8.4*   No results for input(s): "AST", "ALT", "ALKPHOS" in the last 168 hours.  Invalid input(s): "TBILI"   Recent Labs  Lab 05/11/22 1144 05/13/22 0500  WBC 13.5* 15.7*  HGB 13.8 12.4  HCT 42.2 37.6  PLT 223 177   Recent Labs  Lab 05/11/22 1745  APTT 24  INR 1.1         Other tests/results: No postoperative imaging to review  Assessment/Plan:  Jamie [name] is a 67 year old woman with a history of a 3 column L1 vertebral fracture that was unstable.  This required posterior percutaneous fusion.  She is currently postop day 1.  No neurological deficits.  - mobilize, discussed and encouraged patient to mobilize today - Once able to, will obtain upright thoracolumbar xrays - pain control - DVT prophylaxis - PTOT - Patient has no current drains  Lovenia Kim, MD/MSCR Department of Neurosurgery

## 2022-05-13 NOTE — Progress Notes (Signed)
PROGRESS NOTE    Jamie [name]  ZOX:[id number] DOB: February 03, 1955 DOA: 05/10/2022 PCP: Judy Pimple, MD   Assessment & Plan:   Principal Problem:   Compression fracture of L1 lumbar vertebra Active Problems:   Fall at home, initial encounter   Essential hypertension   Ulcerative colitis   DM (diabetes mellitus), type 2 with renal complications   Hyperlipidemia associated with type 2 diabetes mellitus   Chronic kidney disease, stage 3a   Leukocytosis   OSA (obstructive sleep apnea)   Morbid obesity   Closed tricolumnar fracture of lumbar vertebra  Assessment and Plan:  Compression fracture of L1 lumbar vertebra: s/p open reduction internal fixation of T12 & L1 fractures & T-11-L3 posterior arthrodesis as per neuro surg. Continue w/ lidocaine patch. Percocet, morphine prn for pain. Neuro surg following and recs apprec   Bladder spasms: started on mirabegron    Fall: at home. Fall precautions    HTN: continue on metoprolol & restart home dose of losartan   Ulcerative colitis: s/p of colectomy and ileostomy. No acute issues    DM2: well controlled, HbA1c 6.4. Continue on SSI w/ accuchecks    HLD: continue on statin    CKDIIIa: Cr continues to trend down today again    Leukocytosis: likely reactive    OSA: continue on home BiPAP    Morbid obesity: BMI 43.4. Complicates overall care & prognosis         DVT prophylaxis: SCDs, heparin SQ Code Status: full  Family Communication: discussed pt's care w/ pt's family at bedside and answered his questions  Disposition Plan: therapy recs SNF   Level of care: Med-Surg  Status is: Inpatient Remains inpatient appropriate because: severity of illness   Consultants:  Neuro surg   Procedures:   Antimicrobials:  Subjective: Pt still c/o back pain.  Objective: Vitals:   05/12/22 1823 05/12/22 1950 05/12/22 2317 05/13/22 0537  BP: (!) 156/59 (!) 141/62 (!) 142/45 (!) 155/63  Pulse:  95 92 91  Resp:  Temp:  98.9 F (37.2 C) 99.4 F (37.4 C) 98.5 F (36.9 C)  TempSrc:      SpO2:  96% 95% 98%  Weight:      Height:        Intake/Output Summary (Last 24 hours) at 05/13/2022 0747 Last data filed at 05/13/2022 0500 Gross per 24 hour  Intake 1600 ml  Output 75 ml  Net 1525 ml   Filed Weights   05/11/22 1628 05/12/22 0701  Weight: 120.2 kg 120.2 kg    Examination:  General exam: Appears calm but uncomfortable Respiratory system: clear breath sounds b/l  Cardiovascular system: S1/S2+. No rubs or clicks Gastrointestinal system: Abd is soft, NT, obese & hypoactive bowel sounds Central nervous system: Alert and oriented. Moves all extremities  Psychiatry: judgement and insight appears normal. Appropriate mood and affect     Data Reviewed: I have personally reviewed following labs and imaging studies  CBC: Recent Labs  Lab 05/11/22 1144 05/13/22 0500  WBC 13.5* 15.7*  HGB 13.8 12.4  HCT 42.2 37.6  MCV 97.5 98.4  PLT 223 177   Basic Metabolic Panel: Recent Labs  Lab 05/11/22 1144 05/12/22 0424 05/13/22 0500  NA 133* 135 137  K 4.6 4.0 4.4  CL 103 104 106  CO2 20* 21* 20*  GLUCOSE 167* 161* 174*  BUN CREATININE 1.82* 1.43* 1.22*  CALCIUM 9.2 8.9 8.4*   GFR: Estimated Creatinine Clearance:  58.6 mL/min (A) (by C-G formula based on SCr of 1.22 mg/dL (H)). Liver Function Tests: No results for input(s): "AST", "ALT", "ALKPHOS", "BILITOT", "PROT", "ALBUMIN" in the last 168 hours. No results for input(s): "LIPASE", "AMYLASE" in the last 168 hours. No results for input(s): "AMMONIA" in the last 168 hours. Coagulation Profile: Recent Labs  Lab 05/11/22 1745  INR 1.1   Cardiac Enzymes: No results for input(s): "CKTOTAL", "CKMB", "CKMBINDEX", "TROPONINI" in the last 168 hours. BNP (last 3 results) No results for input(s): "PROBNP" in the last 8760 hours. HbA1C: No results for input(s): "HGBA1C" in the last 72 hours. CBG: Recent Labs  Lab  05/12/22 0655 05/12/22 1059 05/12/22 1258 05/12/22 1627 05/12/22 2114  GLUCAP 132* 195* 213* 227* 191*   Lipid Profile: No results for input(s): "CHOL", "HDL", "LDLCALC", "TRIG", "CHOLHDL", "LDLDIRECT" in the last 72 hours. Thyroid Function Tests: No results for input(s): "TSH", "T4TOTAL", "FREET4", "T3FREE", "THYROIDAB" in the last 72 hours. Anemia Panel: No results for input(s): "VITAMINB12", "FOLATE", "FERRITIN", "TIBC", "IRON", "RETICCTPCT" in the last 72 hours. Sepsis Labs: No results for input(s): "PROCALCITON", "LATICACIDVEN" in the last 168 hours.  No results found for this or any previous visit (from the past 240 hour(s)).       Radiology Studies: DG Thoracic Spine 2 View  Result Date: 05/12/2022 CLINICAL DATA:  696295 Spinal instability 284132. Intraoperative films of thoracic and lumbar fusion due to fracture. EXAM: THORACIC SPINE 2 VIEWS COMPARISON:  Thoracic spine radiographs 05/10/2022. FLUOROSCOPY: Exposure Index (as provided by the fluoroscopic device): 97.94 mGy Kerma. Fluoroscopy time: 1 minutes, 6 seconds. FINDINGS: Multiple intraoperative fluoroscopic spot images are provided without a radiologist present for the procedure described above. IMPRESSION: Multiple intraoperative fluoroscopic spot images are provided without a radiologist present for the procedure described above. Electronically Signed   By: Orvan Falconer M.D.   On: 05/12/2022 10:42   DG C-Arm 1-60 Min-No Report  Result Date: 05/12/2022 Fluoroscopy was utilized by the requesting physician.  No radiographic interpretation.   CT HEAD WO CONTRAST ( )  Result Date: 05/11/2022 CLINICAL DATA:  Fall EXAM: CT HEAD WITHOUT CONTRAST CT CERVICAL SPINE WITHOUT CONTRAST TECHNIQUE: Multidetector CT imaging of the head and cervical spine was performed following the standard protocol without intravenous contrast. Multiplanar CT image reconstructions of the cervical spine were also generated. RADIATION DOSE  REDUCTION: This exam was performed according to the departmental dose-optimization program which includes automated exposure control, adjustment of the mA and/or kV according to patient size and/or use of iterative reconstruction technique. COMPARISON:  CT head 01/09/2014, no prior CT cervical spine FINDINGS: CT HEAD FINDINGS Brain: No evidence of acute infarct, hemorrhage, mass, mass effect, or midline shift. No hydrocephalus or extra-axial fluid collection. Vascular: No hyperdense vessel. Skull: Negative for fracture or focal lesion. Sinuses/Orbits: No acute finding. Other: The mastoid air cells are well aerated. CT CERVICAL SPINE FINDINGS Alignment: No traumatic listhesis. Straightening of the normal cervical lordosis. Skull base and vertebrae: Evaluation of the lower cervical spine is somewhat limited by artifact related to overlapping soft tissues. Within this limitation, no acute fracture. No primary bone lesion or focal pathologic process. Prominent anterior osteophytes at C1 and C3-C6, which indents on the posterior aspect of the pharynx. Soft tissues and spinal canal: No prevertebral fluid or swelling. No visible canal hematoma. Disc levels: Degenerative changes in the cervical spine. No significant spinal canal stenosis. Upper chest: Negative. IMPRESSION: 1. No acute intracranial process. 2. No acute fracture or traumatic listhesis in the cervical spine. Electronically  Signed   By: Wiliam Ke M.D.   On: 05/11/2022 11:56   CT CERVICAL SPINE WO CONTRAST  Result Date: 05/11/2022 CLINICAL DATA:  Fall EXAM: CT HEAD WITHOUT CONTRAST CT CERVICAL SPINE WITHOUT CONTRAST TECHNIQUE: Multidetector CT imaging of the head and cervical spine was performed following the standard protocol without intravenous contrast. Multiplanar CT image reconstructions of the cervical spine were also generated. RADIATION DOSE REDUCTION: This exam was performed according to the departmental dose-optimization program which includes  automated exposure control, adjustment of the mA and/or kV according to patient size and/or use of iterative reconstruction technique. COMPARISON:  CT head 01/09/2014, no prior CT cervical spine FINDINGS: CT HEAD FINDINGS Brain: No evidence of acute infarct, hemorrhage, mass, mass effect, or midline shift. No hydrocephalus or extra-axial fluid collection. Vascular: No hyperdense vessel. Skull: Negative for fracture or focal lesion. Sinuses/Orbits: No acute finding. Other: The mastoid air cells are well aerated. CT CERVICAL SPINE FINDINGS Alignment: No traumatic listhesis. Straightening of the normal cervical lordosis. Skull base and vertebrae: Evaluation of the lower cervical spine is somewhat limited by artifact related to overlapping soft tissues. Within this limitation, no acute fracture. No primary bone lesion or focal pathologic process. Prominent anterior osteophytes at C1 and C3-C6, which indents on the posterior aspect of the pharynx. Soft tissues and spinal canal: No prevertebral fluid or swelling. No visible canal hematoma. Disc levels: Degenerative changes in the cervical spine. No significant spinal canal stenosis. Upper chest: Negative. IMPRESSION: 1. No acute intracranial process. 2. No acute fracture or traumatic listhesis in the cervical spine. Electronically Signed   By: Wiliam Ke M.D.   On: 05/11/2022 11:56   MR LUMBAR SPINE WO CONTRAST  Result Date: 05/11/2022 CLINICAL DATA:  Low back pain, trauma.  Fall. EXAM: MRI LUMBAR SPINE WITHOUT CONTRAST TECHNIQUE: Multiplanar, multisequence MR imaging of the lumbar spine was performed. No intravenous contrast was administered. COMPARISON:  Lumbar spine CT 05/10/2022 FINDINGS: Segmentation:  Standard. Alignment:  Trace anterolisthesis of L4 on L5. Vertebrae: Has shown on CT, there is an acute transverse fracture through the superior aspect of the L1 vertebral body which extends through the posterior elements bilaterally (pedicles and superior  articular processes) without significant displacement. There is only at most subtle anterior compression of the L1 vertebral body. No other lumbar fracture is identified. Conus medullaris and cauda equina: Conus extends to the upper L1 level. Conus and cauda equina appear normal. Paraspinal and other soft tissues: Unremarkable. Disc levels: Disc desiccation throughout the lumbar spine with largely preserved disc space heights. T12-L1: Mild facet hypertrophy without disc herniation or stenosis. L1-2: Mild disc bulging and mild facet hypertrophy without stenosis. L2-3: Mild disc bulging and moderate facet hypertrophy result in borderline to mild right lateral recess stenosis and moderate right neural foraminal stenosis without spinal stenosis. L3-4: Disc bulging and severe facet hypertrophy result in mild spinal stenosis, mild bilateral lateral recess stenosis, and mild right neural foraminal stenosis. L4-5: Anterolisthesis with mild bulging of uncovered disc and severe facet hypertrophy result in moderate spinal stenosis, moderate bilateral lateral recess stenosis, and minimal to mild right and mild-to-moderate left neural foraminal stenosis. L5-S1: Minimal disc bulging and moderate to severe right and mild-to-moderate left facet hypertrophy without significant stenosis. IMPRESSION: 1. Acute nondisplaced transverse fracture through the superior aspect of the L1 vertebral body and posterior elements. 2. Multilevel lumbar disc and facet degeneration, most notable at L4-5 where there is moderate spinal stenosis. Electronically Signed   By: Jolaine Click.D.  On: 05/11/2022 11:34   MR THORACIC SPINE WO CONTRAST  Result Date: 05/11/2022 CLINICAL DATA:  Back trauma, abnormal neuro exam, CT or xray positive (Age >= 16y). Fall. EXAM: MRI THORACIC SPINE WITHOUT CONTRAST TECHNIQUE: Multiplanar, multisequence MR imaging of the thoracic spine was performed. No intravenous contrast was administered. COMPARISON:  Thoracic  spine CT 05/10/2022 FINDINGS: Alignment:  Normal. Vertebrae: Minimal STIR hyperintensity/edema along the C7 and T8 superior endplates without a definite acute fracture when correlating with CT. Partial defect in bridging anterior osteophytes/syndesmophytes at T7-8 appears corticated/chronic on CT. No suspicious marrow lesion. Cord:  Normal signal and morphology. Paraspinal and other soft tissues: Unremarkable. Disc levels: Mild diffuse thoracic spondylosis and facet arthrosis without significant stenosis. IMPRESSION: Minimal edema type signal along the C7 and T8 superior endplates without a definite acute fracture when correlating with CT. Electronically Signed   By: Sebastian Ache M.D.   On: 05/11/2022 11:20        Scheduled Meds:  atorvastatin  5 mg Oral QODAY   cholecalciferol  1,000 Units Oral Daily   fluticasone  2 spray Each Nare QHS   heparin  5,000 Units Subcutaneous Q8H   insulin aspart  0-5 Units Subcutaneous QHS   insulin aspart  0-9 Units Subcutaneous TID WC   lidocaine  1 patch Transdermal Q24H   metoprolol tartrate  25 mg Oral Daily   mirabegron ER  25 mg Oral Daily   multivitamin with minerals  1 tablet Oral Daily   pantoprazole  40 mg Oral Daily   sodium chloride flush  3 mL Intravenous Q12H   Continuous Infusions:  sodium chloride     sodium chloride       LOS: 2 days    Time spent: 35 mins     Charise Killian, MD [facility] Pager [phone]  If 7PM-7AM, please contact night-coverage [email or web address] 05/13/2022, 7:47 AM

## 2022-05-13 NOTE — Evaluation (Signed)
Physical Therapy Evaluation Patient Details Name: Jamie [name] MRN: [id number] DOB: 1956-01-08 Today's Date: 05/13/2022  History of Present Illness  Pt is a 67 y/o F admitted on 05/10/22 after presenting with c/o a fall resulting in lower back pain. Pt found to have compression fx of L1 lumbar vertebra. Neurosurgery was consulted & pt underwent posterior thoracic fusion T11-L3 on 05/12/22. PMH: HTN, HLD, DM, CKD3a, OSA on CPAP, morbid obesity, anemia, gallstone, kidney stone, ulcerative colitis s/p colectomy & ileostomy, peripheral neuropathy  Clinical Impression  Pt seen for PT evaluation with pt agreeable to tx, co-tx with OT. Pt reports prior to admission she was independent without AD, retired but volunteering, & still driving. On this date, pt is limited by significant back pain with minimal movement despite being premedicated, as well as anxiety re: movement. Pt requires max<>total assist +2 for bed mobility, STS with min<>mod assist +2, & step pivot with min assist +2 with RW. Pt requires extra time to complete mobility tasks & feels better with TLSO donned for comfort. Recommend ongoing PT services to address deficits noted.     Recommendations for follow up therapy are one component of a multi-disciplinary discharge planning process, led by the attending physician.  Recommendations may be updated based on patient status, additional functional criteria and insurance authorization.  Follow Up Recommendations Can patient physically be transported by private vehicle: No     Assistance Recommended at Discharge Frequent or constant Supervision/Assistance  Patient can return home with the following  Two people to help with walking and/or transfers;Two people to help with bathing/dressing/bathroom;Help with stairs or ramp for entrance;Assist for transportation;Assistance with cooking/housework    Equipment Recommendations None recommended by PT (TBD in next venue)  Recommendations for Other  Services       Functional Status Assessment Patient has had a recent decline in their functional status and demonstrates the ability to make significant improvements in function in a reasonable and predictable amount of time.     Precautions / Restrictions Precautions Precautions: Fall;Back Required Braces or Orthoses: Spinal Brace Cervical Brace: For comfort Spinal Brace: Thoracolumbosacral orthotic;Applied in sitting position Restrictions Weight Bearing Restrictions: No      Mobility  Bed Mobility Overal bed mobility: Needs Assistance Bed Mobility: Rolling, Sidelying to Sit Rolling: Max assist Sidelying to sit: Total assist, +2 for physical assistance, +2 for safety/equipment, HOB elevated            Transfers Overall transfer level: Needs assistance Equipment used: Rolling walker (2 wheels) Transfers: Sit to/from Stand, Bed to chair/wheelchair/BSC Sit to Stand: Min assist, Mod assist, +2 physical assistance, From elevated surface   Step pivot transfers: Min assist, Min guard, +2 physical assistance (extra time for stepping & cuing for use/maneuvering of RW)       General transfer comment: Education re: hand placement during STS from EOB    Ambulation/Gait                  Stairs            Wheelchair Mobility    Modified Rankin (Stroke Patients Only)       Balance Overall balance assessment: Needs assistance Sitting-balance support: Feet supported, Bilateral upper extremity supported Sitting balance-Leahy Scale: Fair Sitting balance - Comments: close supervision static sitting   Standing balance support: Bilateral upper extremity supported, During functional activity, Reliant on assistive device for balance Standing balance-Leahy Scale: Poor  Pertinent Vitals/Pain Pain Assessment Pain Assessment: Faces Faces Pain Scale: Hurts worst Pain Location: back with movement Pain Descriptors /  Indicators: Grimacing, Guarding, Discomfort Pain Intervention(s): Monitored during session, Limited activity within patient's tolerance, Repositioned, Premedicated before session, RN gave pain meds during session    Home Living Family/patient expects to be discharged to:: Private residence Living Arrangements: Spouse/significant other Available Help at Discharge: Family Type of Home: House Home Access: Stairs to enter Entrance Stairs-Rails: Right;Left (wideset) Entrance Stairs-Number of Steps: 3 Alternate Level Stairs-Number of Steps: main level + basement Home Layout: Two level;Able to live on main level with bedroom/bathroom Home Equipment: None      Prior Function Prior Level of Function : Independent/Modified Independent;Driving             Mobility Comments: Independent without AD, driving, retired but still volunteering.       Hand Dominance        Extremity/Trunk Assessment   Upper Extremity Assessment Upper Extremity Assessment: Generalized weakness    Lower Extremity Assessment Lower Extremity Assessment: Generalized weakness (3-/5 knee extension BLE in sitting)    Cervical / Trunk Assessment Cervical / Trunk Assessment:  (TLSO donned sitting EOB, pt with chronic R side ostomy)  Communication   Communication: No difficulties  Cognition Arousal/Alertness: Awake/alert Behavior During Therapy: WFL for tasks assessed/performed, Anxious Overall Cognitive Status: Within Functional Limits for tasks assessed                                          General Comments      Exercises     Assessment/Plan    PT Assessment Patient needs continued PT services  PT Problem List Decreased strength;Pain;Decreased range of motion;Decreased activity tolerance;Decreased balance;Decreased mobility;Decreased knowledge of precautions;Decreased safety awareness;Decreased knowledge of use of DME;Decreased skin integrity       PT Treatment Interventions  DME instruction;Therapeutic exercise;Balance training;Gait training;Stair training;Neuromuscular re-education;Functional mobility training;Therapeutic activities;Patient/family education;Manual techniques;Modalities    PT Goals (Current goals can be found in the Care Plan section)  Acute Rehab PT Goals Patient Stated Goal: decreased pain, get better PT Goal Formulation: With patient Time For Goal Achievement: 05/27/22 Potential to Achieve Goals: Good    Frequency 7X/week     Co-evaluation PT/OT/SLP Co-Evaluation/Treatment: Yes Reason for Co-Treatment: For patient/therapist safety;To address functional/ADL transfers PT goals addressed during session: Mobility/safety with mobility;Proper use of DME;Balance         AM-PAC PT "6 Clicks" Mobility  Outcome Measure Help needed turning from your back to your side while in a flat bed without using bedrails?: Total Help needed moving from lying on your back to sitting on the side of a flat bed without using bedrails?: Total Help needed moving to and from a bed to a chair (including a wheelchair)?: A Lot Help needed standing up from a chair using your arms (e.g., wheelchair or bedside chair)?: A Lot Help needed to walk in hospital room?: Total Help needed climbing 3-5 steps with a railing? : Total 6 Click Score: 8    End of Session Equipment Utilized During Treatment: Back brace Activity Tolerance: Patient limited by pain Patient left: in chair;with call bell/phone within reach;with family/visitor present Nurse Communication: Mobility status PT Visit Diagnosis: Muscle weakness (generalized) (M62.81);Pain;Difficulty in walking, not elsewhere classified (R26.2);Other abnormalities of gait and mobility (R26.89);Unsteadiness on feet (R26.81) Pain - part of body:  (back)    Time: [phone]  PT Time Calculation (min) (ACUTE ONLY): 39 min   Charges:   PT Evaluation $PT Eval Moderate Complexity: 1 Mod PT Treatments $Therapeutic Activity:  8-22 mins        Aleda Grana, PT, DPT 05/13/22, 8:29 AM   Sandi Mariscal 05/13/2022, 8:28 AM

## 2022-05-13 NOTE — Evaluation (Signed)
Occupational Therapy Evaluation Patient Details Name: Jamie [name] MRN: [id number] DOB: 1955-03-06 Today's Date: 05/13/2022   History of Present Illness Pt is a 67 y/o F admitted on 05/10/22 after presenting with c/o a fall resulting in lower back pain. Pt found to have compression fx of L1 lumbar vertebra. Neurosurgery was consulted & pt underwent posterior thoracic fusion T11-L3 on 05/12/22. PMH: HTN, HLD, DM, CKD3a, OSA on CPAP, morbid obesity, anemia, gallstone, kidney stone, ulcerative colitis s/p colectomy & ileostomy, peripheral neuropathy   Clinical Impression   Ms Toohey was seen for OT/PT co-evaluation this date. Prior to hospital admission, pt was IND. Pt lives with spouse. Pt presents to acute OT demonstrating impaired ADL performance and functional mobility 2/2 pain, decreased activity tolerance, and functional strength/ROM/balance deficits. Pt currently requires TOTAL A side lying>sitting. MAX A don TLSO in sitting and don B socks. MIN A x2 + RW simulated BSC t/f. SETUP seated ostomy care, reports increased pain with unsupported sitting. Pt would benefit from skilled OT to address noted impairments and functional limitations (see below for any additional details). Upon hospital discharge, recommend follow up therapy and +2 assistance for mobility.    Recommendations for follow up therapy are one component of a multi-disciplinary discharge planning process, led by the attending physician.  Recommendations may be updated based on patient status, additional functional criteria and insurance authorization.   Assistance Recommended at Discharge Frequent or constant Supervision/Assistance  Patient can return home with the following Two people to help with walking and/or transfers;Two people to help with bathing/dressing/bathroom;Help with stairs or ramp for entrance    Functional Status Assessment  Patient has had a recent decline in their functional status and demonstrates the ability to  make significant improvements in function in a reasonable and predictable amount of time.  Equipment Recommendations  BSC/3in1;Hospital bed    Recommendations for Other Services       Precautions / Restrictions Precautions Precautions: Fall;Back Required Braces or Orthoses: Spinal Brace Cervical Brace: For comfort Spinal Brace: Thoracolumbosacral orthotic;Applied in sitting position Restrictions Weight Bearing Restrictions: No      Mobility Bed Mobility Overal bed mobility: Needs Assistance Bed Mobility: Rolling, Sidelying to Sit Rolling: Max assist Sidelying to sit: Total assist, +2 for physical assistance, +2 for safety/equipment, HOB elevated            Transfers Overall transfer level: Needs assistance Equipment used: Rolling walker (2 wheels) Transfers: Sit to/from Stand, Bed to chair/wheelchair/BSC Sit to Stand: Min assist, Mod assist, +2 physical assistance, From elevated surface     Step pivot transfers: Min assist, Min guard, +2 physical assistance            Balance Overall balance assessment: Needs assistance Sitting-balance support: Feet supported, Bilateral upper extremity supported Sitting balance-Leahy Scale: Fair     Standing balance support: Bilateral upper extremity supported, During functional activity, Reliant on assistive device for balance Standing balance-Leahy Scale: Poor                             ADL either performed or assessed with clinical judgement   ADL Overall ADL's : Needs assistance/impaired                                       General ADL Comments: MAX A don TLSO in sitting and don B socks. MIN A x2 +  RW simulated BSC t/f. SETUP seated ostomy care, reports increased pain with unsupported sitting.      Pertinent Vitals/Pain Pain Assessment Pain Assessment: Faces Faces Pain Scale: Hurts worst Pain Location: back with movement Pain Descriptors / Indicators: Grimacing, Guarding,  Discomfort Pain Intervention(s): Patient requesting pain meds-RN notified, RN gave pain meds during session, Repositioned     Hand Dominance Right   Extremity/Trunk Assessment Upper Extremity Assessment Upper Extremity Assessment: Generalized weakness   Lower Extremity Assessment Lower Extremity Assessment: Generalized weakness   Cervical / Trunk Assessment Cervical / Trunk Assessment:  (TLSO donned sitting EOB, pt with chronic R side ostomy)   Communication Communication Communication: No difficulties   Cognition Arousal/Alertness: Awake/alert Behavior During Therapy: WFL for tasks assessed/performed, Anxious Overall Cognitive Status: Within Functional Limits for tasks assessed                                                  Home Living Family/patient expects to be discharged to:: Private residence Living Arrangements: Spouse/significant other Available Help at Discharge: Family Type of Home: House Home Access: Stairs to enter Secretary/administrator of Steps: 3 Entrance Stairs-Rails: Right;Left (wideset) Home Layout: Two level;Able to live on main level with bedroom/bathroom Alternate Level Stairs-Number of Steps: main level + basement             Home Equipment: None          Prior Functioning/Environment Prior Level of Function : Independent/Modified Independent;Driving             Mobility Comments: Independent without AD, driving, retired but still volunteering. ADLs Comments: IND ostomy care        OT Problem List: Decreased strength;Decreased range of motion;Decreased activity tolerance;Impaired balance (sitting and/or standing);Decreased safety awareness      OT Treatment/Interventions: Self-care/ADL training;Therapeutic exercise;Energy conservation;DME and/or AE instruction;Therapeutic activities;Patient/family education;Balance training    OT Goals(Current goals can be found in the care plan section) Acute Rehab OT  Goals Patient Stated Goal: to improve pain OT Goal Formulation: With patient Time For Goal Achievement: 05/27/22 Potential to Achieve Goals: Good ADL Goals Pt Will Perform Grooming: with min guard assist;standing Pt Will Perform Upper Body Dressing: with min guard assist;with caregiver independent in assisting;sitting Pt Will Perform Lower Body Dressing: with mod assist;sit to/from stand;with caregiver independent in assisting;with adaptive equipment Pt Will Transfer to Toilet: with min guard assist;ambulating;bedside commode  OT Frequency: Min 3X/week    Co-evaluation PT/OT/SLP Co-Evaluation/Treatment: Yes Reason for Co-Treatment: For patient/therapist safety;To address functional/ADL transfers PT goals addressed during session: Mobility/safety with mobility;Proper use of DME;Balance OT goals addressed during session: ADL's and self-care      AM-PAC OT "6 Clicks" Daily Activity     Outcome Measure Help from another person eating meals?: None Help from another person taking care of personal grooming?: A Little Help from another person toileting, which includes using toliet, bedpan, or urinal?: A Lot Help from another person bathing (including washing, rinsing, drying)?: A Lot Help from another person to put on and taking off regular upper body clothing?: A Lot Help from another person to put on and taking off regular lower body clothing?: A Lot 6 Click Score: 15   End of Session Nurse Communication: Mobility status  Activity Tolerance: Patient tolerated treatment well Patient left: in chair;with call bell/phone within reach;with chair alarm set;with family/visitor present  OT Visit Diagnosis: Other abnormalities of gait and mobility (R26.89);Muscle weakness (generalized) (M62.81)                Time: 1610-9604 OT Time Calculation (min): 27 min Charges:  OT General Charges $OT Visit: 1 Visit OT Evaluation $OT Eval Moderate Complexity: 1 Mod OT Treatments $Self Care/Home  Management : 8-22 mins  Kathie Dike, M.S. OTR/L  05/13/22, 10:08 AM  ascom [phone]

## 2022-05-14 ENCOUNTER — Inpatient Hospital Stay: Payer: Medicare PPO

## 2022-05-14 DIAGNOSIS — N1831 Chronic kidney disease, stage 3a: Secondary | ICD-10-CM | POA: Diagnosis not present

## 2022-05-14 DIAGNOSIS — S32010A Wedge compression fracture of first lumbar vertebra, initial encounter for closed fracture: Secondary | ICD-10-CM | POA: Diagnosis not present

## 2022-05-14 LAB — BASIC METABOLIC PANEL
Anion gap: 8 (ref 5–15)
BUN: 28 mg/dL — ABNORMAL HIGH (ref 8–23)
CO2: 20 mmol/L — ABNORMAL LOW (ref 22–32)
Calcium: 8.6 mg/dL — ABNORMAL LOW (ref 8.9–10.3)
Chloride: 103 mmol/L (ref 98–111)
Creatinine, Ser: 1.67 mg/dL — ABNORMAL HIGH (ref 0.44–1.00)
GFR, Estimated: 33 mL/min — ABNORMAL LOW (ref >60)
Glucose, Bld: 235 mg/dL — ABNORMAL HIGH (ref 70–99)
Potassium: 4.2 mmol/L (ref 3.5–5.1)
Sodium: 131 mmol/L — ABNORMAL LOW (ref 135–145)

## 2022-05-14 LAB — GLUCOSE, CAPILLARY
Glucose-Capillary: 232 mg/dL — ABNORMAL HIGH (ref 70–99)
Glucose-Capillary: 220 mg/dL — ABNORMAL HIGH (ref 70–99)
Glucose-Capillary: 216 mg/dL — ABNORMAL HIGH (ref 70–99)
Glucose-Capillary: 194 mg/dL — ABNORMAL HIGH (ref 70–99)

## 2022-05-14 LAB — CBC
HCT: 37 % (ref 36.0–46.0)
Hemoglobin: 12.3 g/dL (ref 12.0–15.0)
MCH: 32.2 pg (ref 26.0–34.0)
MCHC: 33.2 g/dL (ref 30.0–36.0)
MCV: 96.9 fL (ref 80.0–100.0)
Platelets: 200 10*3/uL (ref 150–400)
RBC: 3.82 MIL/uL — ABNORMAL LOW (ref 3.87–5.11)
RDW: 13.5 % (ref 11.5–15.5)
WBC: 13.1 10*3/uL — ABNORMAL HIGH (ref 4.0–10.5)
nRBC: 0 % (ref 0.0–0.2)

## 2022-05-14 NOTE — Progress Notes (Signed)
Attending Progress Note  History: Jamie [name] is here for an unstable L1 3 column fracture.  Chance type.  Status post percutaneous fusion.  POD1: Left LFCN [place], expected postop soreness POD2: Left LFNC Numbness improved significantly. Overall mobility improved. Xrays completed. Worked with PT  Physical Exam: Vitals:   05/13/22 1501 05/13/22 2151  BP: (!) 117/50 (!) 141/47  Pulse: 82 86  Resp: 19 20  Temp: 98.2 F (36.8 C) 99.8 F (37.7 C)  SpO2: 97% 95%    AA Ox3 CNI  Strength:5/5 throughout   Left LFCN decreased sensation  Dressings in place  Data:  Recent Labs  Lab 05/12/22 0424 05/13/22 0500 05/14/22 0458  NA 135 137 131*  K 4.0 4.4 4.2  CL 104 106 103  CO2 21* 20* 20*  BUN 21 16 28*  CREATININE 1.43* 1.22* 1.67*  GLUCOSE 161* 174* 235*  CALCIUM 8.9 8.4* 8.6*   No results for input(s): "AST", "ALT", "ALKPHOS" in the last 168 hours.  Invalid input(s): "TBILI"   Recent Labs  Lab 05/11/22 1144 05/13/22 0500 05/14/22 0458  WBC 13.5* 15.7* 13.1*  HGB 13.8 12.4 12.3  HCT 42.2 37.6 37.0  PLT 223 177 200   Recent Labs  Lab 05/11/22 1745  APTT 24  INR 1.1          Other tests/results: Instrumentatino in place, no progressive of deformity.     Assessment/Plan:  Jamie [name] is a 67 year old woman with a history of a 3 column L1 vertebral fracture that was unstable.  This required posterior percutaneous fusion.  She is currently postop day 1.  No neurological deficits.  - mobilize, discussed and encouraged patient to mobilize today. Continue Brace while OOB for 6 weeks.  - Xrays completed and appear stable, non need for further imaging.  - Left Lower extremity sensation patch improving, likely positional and likely to continue improve with time.  - pain control - DVT prophylaxis - PTOT - Patient has no current drains  Lovenia Kim, MD/MSCR Department of Neurosurgery

## 2022-05-14 NOTE — Progress Notes (Addendum)
Physical Therapy Treatment Patient Details Name: Jamie [name] MRN: [id number] DOB: 1955-03-19 Today's Date: 05/14/2022   History of Present Illness Pt is a 67 y/o F admitted on 05/10/22 after presenting with c/o a fall resulting in lower back pain. Pt found to have compression fx of L1 lumbar vertebra. Neurosurgery was consulted & pt underwent posterior thoracic fusion T11-L3 on 05/12/22. PMH: HTN, HLD, DM, CKD3a, OSA on CPAP, morbid obesity, anemia, gallstone, kidney stone, ulcerative colitis s/p colectomy & ileostomy, peripheral neuropathy    PT Comments    Ready for session.  BLE A/AAROM x 10.  She stated she finds it easier to move her legs today.  She is able to progress to EOB with rails and mod a x 1 for assist and sequencing.  Once sitting generally steady.  She is able to stand to RW with min a x 2 from elevated bed and transition to recliner at bedside.  Remained in chair after session.  Mobility remains limited by general pain.   Recommendations for follow up therapy are one component of a multi-disciplinary discharge planning process, led by the attending physician.  Recommendations may be updated based on patient status, additional functional criteria and insurance authorization.  Follow Up Recommendations       Assistance Recommended at Discharge Frequent or constant Supervision/Assistance  Patient can return home with the following Two people to help with walking and/or transfers;Two people to help with bathing/dressing/bathroom;Help with stairs or ramp for entrance;Assist for transportation;Assistance with cooking/housework   Equipment Recommendations  None recommended by PT    Recommendations for Other Services       Precautions / Restrictions Precautions Precautions: Fall;Back Required Braces or Orthoses: Spinal Brace Cervical Brace: For comfort Spinal Brace: Thoracolumbosacral orthotic;Applied in sitting position Restrictions Weight Bearing Restrictions: No      Mobility  Bed Mobility Overal bed mobility: Needs Assistance Bed Mobility: Rolling, Sidelying to Sit Rolling: Mod assist Sidelying to sit: Mod assist, Max assist       General bed mobility comments: +1 today vs +2 yesterday Patient Response: Cooperative  Transfers Overall transfer level: Needs assistance Equipment used: Rolling walker (2 wheels) Transfers: Sit to/from Stand, Bed to chair/wheelchair/BSC Sit to Stand: Min assist, +2 safety/equipment, From elevated surface   Step pivot transfers: Min assist, +2 physical assistance            Ambulation/Gait Ambulation/Gait assistance: Min assist, +2 physical assistance Gait Distance (Feet): 3 Feet Assistive device: Rolling walker (2 wheels) Gait Pattern/deviations: Step-to pattern       General Gait Details: good steps to chair   Stairs             Wheelchair Mobility    Modified Rankin (Stroke Patients Only)       Balance Overall balance assessment: Needs assistance Sitting-balance support: Feet supported, Bilateral upper extremity supported Sitting balance-Leahy Scale: Fair     Standing balance support: Bilateral upper extremity supported, During functional activity, Reliant on assistive device for balance Standing balance-Leahy Scale: Poor Standing balance comment: heavy reliance on walker for support                            Cognition Arousal/Alertness: Awake/alert Behavior During Therapy: WFL for tasks assessed/performed, Anxious Overall Cognitive Status: Within Functional Limits for tasks assessed  Exercises      General Comments        Pertinent Vitals/Pain Pain Assessment Pain Assessment: Faces Faces Pain Scale: Hurts even more Pain Location: back with movement Pain Descriptors / Indicators: Grimacing, Guarding, Discomfort Pain Intervention(s): Limited activity within patient's tolerance, Monitored during  session, Repositioned    Home Living                          Prior Function            PT Goals (current goals can now be found in the care plan section) Progress towards PT goals: Progressing toward goals    Frequency    7X/week      PT Plan Current plan remains appropriate    Co-evaluation              AM-PAC PT "6 Clicks" Mobility   Outcome Measure  Help needed turning from your back to your side while in a flat bed without using bedrails?: A Lot Help needed moving from lying on your back to sitting on the side of a flat bed without using bedrails?: Total Help needed moving to and from a bed to a chair (including a wheelchair)?: A Lot Help needed standing up from a chair using your arms (e.g., wheelchair or bedside chair)?: A Lot Help needed to walk in hospital room?: A Lot Help needed climbing 3-5 steps with a railing? : Total 6 Click Score: 10    End of Session Equipment Utilized During Treatment: Back brace;Gait belt Activity Tolerance: Patient limited by pain Patient left: in chair;with call bell/phone within reach;with family/visitor present Nurse Communication: Mobility status PT Visit Diagnosis: Muscle weakness (generalized) (M62.81);Pain;Difficulty in walking, not elsewhere classified (R26.2);Other abnormalities of gait and mobility (R26.89);Unsteadiness on feet (R26.81)     Time: 1610-9604 PT Time Calculation (min) (ACUTE ONLY): 23 min  Charges:  $Therapeutic Activity: 23-37 mins                   Danielle Dess, PTA 05/14/22, 1:12 PM

## 2022-05-14 NOTE — TOC Initial Note (Signed)
Transition of Care [facility]) - Initial/Assessment Note    Patient Details  Name: Jamie [name] MRN: [id number] Date of Birth: Jul 16, 1955  [facility] of [facility]) CM/SW Contact:    Bing Quarry, RN Phone Number: 05/14/2022, 1:00 PM  Clinical Narrative:  Admitted with fall at church on some stairs. POD #2 for back surgery 05/12/22. Spoke with spouse per [facility] as patient was with therapy.  Significant PMH for DM, CKD, OSA wth CPAP TLSO brace delivered after surgery.  No issues with transportation PTA, was driving, and spouse can transport. No issues obtaining medications.                Lives with spouse from home.  PCP: [facility] [phone] - [place], [place] - [address] AT [facility] [address] [facility] [[id number]]  [id number] [place], [place] - [address] [[id number]]  Mcgann,[name] (Spouse)  [phone] (Mobile)  Gabriel Cirri RN CM        Patient Goals and CMS Choice            Expected Discharge Plan and Services                                              Prior Living Arrangements/Services                       Activities of Daily Living Home Assistive Devices/Equipment: Eyeglasses, CBG Meter ADL Screening (condition at time of admission) Patient's cognitive ability adequate to safely complete daily activities?: Yes Is the patient deaf or have difficulty hearing?: No Does the patient have difficulty seeing, even when wearing glasses/contacts?: No Does the patient have difficulty concentrating, remembering, or making decisions?: No Patient able to express need for assistance with ADLs?: Yes Does the patient have difficulty dressing or bathing?: Yes Independently performs ADLs?: No Communication: Independent Dressing (OT): Needs assistance Is this a change from baseline?: Pre-admission baseline Grooming: Needs assistance Is this a change from baseline?: Pre-admission baseline Feeding: Needs assistance Is  this a change from baseline?: Pre-admission baseline Bathing: Needs assistance Is this a change from baseline?: Pre-admission baseline Toileting: Needs assistance Is this a change from baseline?: Pre-admission baseline In/Out Bed: Needs assistance Is this a change from baseline?: Pre-admission baseline Walks in Home: Needs assistance Is this a change from baseline?: Pre-admission baseline Does the patient have difficulty walking or climbing stairs?: Yes Weakness of Legs: Both Weakness of Arms/Hands: Both  Permission Sought/Granted                  Emotional Assessment              Admission diagnosis:  Compression fracture of L1 lumbar vertebra [S32.010A] Compression fracture of lumbar vertebra, unspecified lumbar vertebral level, initial encounter [S32.000A] Patient Active Problem List   Diagnosis Date Noted   Closed tricolumnar fracture of lumbar vertebra 05/12/2022   Compression fracture of L1 lumbar vertebra 05/11/2022   Morbid obesity 05/11/2022   Fall at home, initial encounter 05/11/2022   Chronic kidney disease, stage 3a 05/11/2022   Closed fracture of first lumbar vertebra 05/11/2022   Closed fracture of twelfth thoracic vertebra 05/11/2022   Spinal instability, lumbar 05/11/2022   Leukocytosis 05/11/2022   Hearing loss, right 04/07/2022   Sinus congestion 04/07/2022   Abnormal SPEP 04/28/2021  Abnormal EKG 04/26/2021   Ileostomy dysfunction 08/31/2020   CKD (chronic kidney disease) stage 3, GFR 30-59 ml/min 05/31/2020   Aortic atherosclerosis 03/01/2020   Urinary frequency 12/23/2019   Elevated serum creatinine 05/14/2019   Dermatitis 07/01/2018   Left shoulder pain 11/30/2017   Palpitations 07/05/2017   Ostomy nurse consultation 06/05/2017   Lichen planus 04/09/2017   Hyperlipidemia associated with type 2 diabetes mellitus 04/10/2016   Obesity (BMI 30-39.9) 01/08/2015   Red blood cell antibody positive 07/02/2013   Elevated C-reactive protein  (CRP) 05/15/2012   Routine general medical examination at a health care facility 09/24/2011   Apnea 11/14/2010   Nevus of lower leg 11/14/2010   Other screening mammogram 10/31/2010   Stress reaction, emotional 05/02/2010   Compulsive overeater 05/02/2010   Arthritis    H/O small bowel obstruction    KNEE PAIN 10/14/2009   DM (diabetes mellitus), type 2 with renal complications 04/23/2009   ROSACEA 09/12/2007   Essential hypertension 05/15/2007   OSA (obstructive sleep apnea) 12/13/2006   Ulcerative colitis 07/12/2006   PCP:  Judy Pimple, MD Pharmacy:   [id number] - [place], [place] - [address] [place] [address] Phone: [phone] Fax: [phone]  [id number] - [place], [place] - [address] AT [facility] [address] [place] [address] [place] [address] Phone: [phone] Fax: [phone]  [facility] - [place], [place] - [address] [place] [address] Phone: [phone] Fax: [phone]     Social Determinants of Health (SDOH) Social History: SDOH Screenings   Food Insecurity: No Food Insecurity (05/13/2022)  Housing: Low Risk  (05/13/2022)  Transportation Needs: No Transportation Needs (05/13/2022)  Utilities: Not At Risk (05/13/2022)  Depression (PHQ2-9): Low Risk  (04/07/2022)  Financial Resource Strain: Low Risk  (05/31/2021)  Physical Activity: Inactive (05/31/2021)  Stress: No Stress Concern Present (05/31/2021)  Tobacco Use: Low Risk  (05/12/2022)   SDOH Interventions:     Readmission Risk Interventions     No data to display

## 2022-05-14 NOTE — Progress Notes (Signed)
PROGRESS NOTE    Jamie [name]  ZOX:[id number] DOB: 1956-01-07 DOA: 05/10/2022 PCP: Judy Pimple, MD   Assessment & Plan:   Principal Problem:   Compression fracture of L1 lumbar vertebra Active Problems:   Fall at home, initial encounter   Essential hypertension   Ulcerative colitis   DM (diabetes mellitus), type 2 with renal complications   Hyperlipidemia associated with type 2 diabetes mellitus   Chronic kidney disease, stage 3a   Leukocytosis   OSA (obstructive sleep apnea)   Morbid obesity   Closed tricolumnar fracture of lumbar vertebra  Assessment and Plan:  Compression fracture of L1 lumbar vertebra: s/p open reduction internal fixation of T12 & L1 fractures & T-11-L3 posterior arthrodesis as per neuro surg. Continue w/ lidocaine patch. Percocet, morphine prn for pain. Neuro surg following and recs apprec. PT/OT recs SNF. Pt is agreeable to SNF   Bladder spasms: continue on mirabegon    Fall: at home. Fall precautions    HTN: continue on metoprolol, losartan   Ulcerative colitis: s/p of colectomy and ileostomy. No acute issues    DM2: HbA1c 6.4, well controlled. Continue on SSI w/ accuchecks    HLD: continue on statin    CKDIIIa: Cr is labile    Leukocytosis: likely reactive, trending down    OSA: continue on home BiPAP    Morbid obesity: BMI 43.4. Complicates overall care & prognosis       DVT prophylaxis: SCDs, heparin SQ Code Status: full  Family Communication: discussed pt's care w/ pt's family at bedside and answered his questions  Disposition Plan: therapy recs SNF   Level of care: Med-Surg  Status is: Inpatient Remains inpatient appropriate because: severity of illness   Consultants:  Neuro surg   Procedures:   Antimicrobials:  Subjective: Pt c/o back pain   Objective: Vitals:   05/13/22 0537 05/13/22 0832 05/13/22 1501 05/13/22 2151  BP: (!) 155/63 (!) 146/59 (!) 117/50 (!) 141/47  Pulse: 91 85 82 86  Resp: Temp: 98.5 F (36.9 C) 97.6 F (36.4 C) 98.2 F (36.8 C) 99.8 F (37.7 C)  TempSrc:      SpO2: 98% 98% 97% 95%  Weight:      Height:        Intake/Output Summary (Last 24 hours) at 05/14/2022 0740 Last data filed at 05/14/2022 0057 Gross per 24 hour  Intake 360 ml  Output 400 ml  Net -40 ml   Filed Weights   05/11/22 1628 05/12/22 0701  Weight: 120.2 kg 120.2 kg    Examination:  General exam: Appears comfortable Respiratory system: clear breath sounds b/l  Cardiovascular system: S1 &S2+. No rubs or clicks Gastrointestinal system: Abd is soft, NT, obese & hypoactive bowel sounds Central nervous system: alert and oriented. Moves all extremities  Psychiatry: judgement and insight appears normal. Flat mood and affect     Data Reviewed: I have personally reviewed following labs and imaging studies  CBC: Recent Labs  Lab 05/11/22 1144 05/13/22 0500 05/14/22 0458  WBC 13.5* 15.7* 13.1*  HGB 13.8 12.4 12.3  HCT 42.2 37.6 37.0  MCV 97.5 98.4 96.9  PLT 223 177 200   Basic Metabolic Panel: Recent Labs  Lab 05/11/22 1144 05/12/22 0424 05/13/22 0500 05/14/22 0458  NA 133* 135 137 131*  K 4.6 4.0 4.4 4.2  CL 103 104 106 103  CO2 20* 21* 20* 20*  GLUCOSE 167* 161* 174* 235*  BUN 28*  CREATININE 1.82* 1.43* 1.22* 1.67*  CALCIUM 9.2 8.9 8.4* 8.6*   GFR: Estimated Creatinine Clearance: 42.8 mL/min (A) (by C-G formula based on SCr of 1.67 mg/dL (H)). Liver Function Tests: No results for input(s): "AST", "ALT", "ALKPHOS", "BILITOT", "PROT", "ALBUMIN" in the last 168 hours. No results for input(s): "LIPASE", "AMYLASE" in the last 168 hours. No results for input(s): "AMMONIA" in the last 168 hours. Coagulation Profile: Recent Labs  Lab 05/11/22 1745  INR 1.1   Cardiac Enzymes: No results for input(s): "CKTOTAL", "CKMB", "CKMBINDEX", "TROPONINI" in the last 168 hours. BNP (last 3 results) No results for input(s): "PROBNP" in the last 8760  hours. HbA1C: No results for input(s): "HGBA1C" in the last 72 hours. CBG: Recent Labs  Lab 05/12/22 2114 05/13/22 0822 05/13/22 1157 05/13/22 1610 05/13/22 2149  GLUCAP 191* 212* 250* 181* 246*   Lipid Profile: No results for input(s): "CHOL", "HDL", "LDLCALC", "TRIG", "CHOLHDL", "LDLDIRECT" in the last 72 hours. Thyroid Function Tests: No results for input(s): "TSH", "T4TOTAL", "FREET4", "T3FREE", "THYROIDAB" in the last 72 hours. Anemia Panel: No results for input(s): "VITAMINB12", "FOLATE", "FERRITIN", "TIBC", "IRON", "RETICCTPCT" in the last 72 hours. Sepsis Labs: No results for input(s): "PROCALCITON", "LATICACIDVEN" in the last 168 hours.  No results found for this or any previous visit (from the past 240 hour(s)).       Radiology Studies: DG Thoracic Spine 2 View  Result Date: 05/12/2022 CLINICAL DATA:  [id number] Spinal instability [id number]. Intraoperative films of thoracic and lumbar fusion due to fracture. EXAM: THORACIC SPINE 2 VIEWS COMPARISON:  Thoracic spine radiographs 05/10/2022. FLUOROSCOPY: Exposure Index (as provided by the fluoroscopic device): 97.94 mGy Kerma. Fluoroscopy time: 1 minutes, 6 seconds. FINDINGS: Multiple intraoperative fluoroscopic spot images are provided without a radiologist present for the procedure described above. IMPRESSION: Multiple intraoperative fluoroscopic spot images are provided without a radiologist present for the procedure described above. Electronically Signed   By: Orvan Falconer M.D.   On: 05/12/2022 10:42   DG C-Arm 1-60 Min-No Report  Result Date: 05/12/2022 Fluoroscopy was utilized by the requesting physician.  No radiographic interpretation.        Scheduled Meds:  atorvastatin  5 mg Oral QODAY   cholecalciferol  1,000 Units Oral Daily   fluticasone  2 spray Each Nare QHS   heparin  5,000 Units Subcutaneous Q8H   insulin aspart  0-5 Units Subcutaneous QHS   insulin aspart  0-9 Units Subcutaneous TID WC   lidocaine   1 patch Transdermal Q24H   losartan  25 mg Oral Daily   metoprolol tartrate  25 mg Oral Daily   mirabegron ER  25 mg Oral Daily   multivitamin with minerals  1 tablet Oral Daily   pantoprazole  40 mg Oral Daily   sodium chloride flush  3 mL Intravenous Q12H   Continuous Infusions:  sodium chloride     sodium chloride       LOS: 3 days    Time spent: 25 mins     Charise Killian, MD [facility] Pager [phone]  If 7PM-7AM, please contact night-coverage [email or web address] 05/14/2022, 7:40 AM

## 2022-05-14 NOTE — TOC Progression Note (Addendum)
Transition of Care [facility]) - Progression Note    Patient Details  Name: Jamie [name] MRN: [id number] Date of Birth: Oct 15, 1955  [facility]) CM/SW Contact  Bing Quarry, RN Phone Number: 05/14/2022, 11:45 AM  Clinical Narrative:  4/21: Patient was occupied with PT. RN CM spoke with spouse (approved in [place]), regarding PT recommendations for STR at a SNF. Patient still in a lot of post operative pain from fall on church steps landing on her back and requiring surgery on 05/12/22 with L1, T12 fusion for L1 fx, with hardware in place. Lives with spouse, independent with ADL's pts, was still driving, htx of colectomy with ileostomy. Will review Medicare choices on website vs list offered. Spouse will discuss with patient and TOC to reach out Monday to discuss further for preferences. He hopes for patient to see enough improvement so can progress and go home with [facility] instead of SNF. Patient lives in [place], [place].  TLSO brace delivered.  PCP: Roxy Manns RX: [facility] [phone] - [place], [place] - [address] AT [facility] OF [address] [facility] [[id number]]  [facility] [phone] [place], [place] - [address] [[id number]]  [place],[name] (Spouse)  [phone] ([place])  Gabriel Cirri RN CM [phone].        Expected Discharge Plan and Services                                               Social Determinants of Health (SDOH) Interventions SDOH Screenings   Food Insecurity: No Food Insecurity (05/13/2022)  Housing: Low Risk  (05/13/2022)  Transportation Needs: No Transportation Needs (05/13/2022)  Utilities: Not At Risk (05/13/2022)  Depression (PHQ2-9): Low Risk  (04/07/2022)  Financial Resource Strain: Low Risk  (05/31/2021)  Physical Activity: Inactive (05/31/2021)  Stress: No Stress Concern Present (05/31/2021)  Tobacco Use: Low Risk  (05/12/2022)    Readmission Risk Interventions     No data to display

## 2022-05-15 DIAGNOSIS — N1831 Chronic kidney disease, stage 3a: Secondary | ICD-10-CM | POA: Diagnosis not present

## 2022-05-15 DIAGNOSIS — S32010A Wedge compression fracture of first lumbar vertebra, initial encounter for closed fracture: Secondary | ICD-10-CM | POA: Diagnosis not present

## 2022-05-15 LAB — BASIC METABOLIC PANEL
Anion gap: 11 (ref 5–15)
BUN: 46 mg/dL — ABNORMAL HIGH (ref 8–23)
CO2: 19 mmol/L — ABNORMAL LOW (ref 22–32)
Calcium: 8.6 mg/dL — ABNORMAL LOW (ref 8.9–10.3)
Chloride: 101 mmol/L (ref 98–111)
Creatinine, Ser: 3.29 mg/dL — ABNORMAL HIGH (ref 0.44–1.00)
GFR, Estimated: 15 mL/min — ABNORMAL LOW (ref >60)
Glucose, Bld: 229 mg/dL — ABNORMAL HIGH (ref 70–99)
Potassium: 4.4 mmol/L (ref 3.5–5.1)
Sodium: 131 mmol/L — ABNORMAL LOW (ref 135–145)

## 2022-05-15 LAB — CBC
HCT: 38.5 % (ref 36.0–46.0)
Hemoglobin: 12.7 g/dL (ref 12.0–15.0)
MCH: 31.9 pg (ref 26.0–34.0)
MCHC: 33 g/dL (ref 30.0–36.0)
MCV: 96.7 fL (ref 80.0–100.0)
Platelets: 246 10*3/uL (ref 150–400)
RBC: 3.98 MIL/uL (ref 3.87–5.11)
RDW: 13.2 % (ref 11.5–15.5)
WBC: 15.1 10*3/uL — ABNORMAL HIGH (ref 4.0–10.5)
nRBC: 0 % (ref 0.0–0.2)

## 2022-05-15 LAB — BPAM RBC

## 2022-05-15 LAB — TYPE AND SCREEN
ABO/RH(D): A POS
Antibody Screen: POSITIVE
Unit division: 0

## 2022-05-15 LAB — GLUCOSE, CAPILLARY
Glucose-Capillary: 221 mg/dL — ABNORMAL HIGH (ref 70–99)
Glucose-Capillary: 283 mg/dL — ABNORMAL HIGH (ref 70–99)
Glucose-Capillary: 214 mg/dL — ABNORMAL HIGH (ref 70–99)
Glucose-Capillary: 207 mg/dL — ABNORMAL HIGH (ref 70–99)

## 2022-05-15 MED ORDER — SIMETHICONE 80 MG PO CHEW
80.0000 mg | CHEWABLE_TABLET | Freq: Four times a day (QID) | ORAL | Status: DC | PRN
  Administered 2022-05-15 – 2022-05-24 (×20): 80 mg via ORAL
  Filled 2022-05-15 (×24): qty 1

## 2022-05-15 MED ORDER — MIRABEGRON ER 25 MG PO TB24: 25.0000 mg | ORAL_TABLET | Freq: Every day | ORAL | Status: DC | PRN

## 2022-05-15 MED ORDER — HYDRALAZINE HCL 20 MG/ML IJ SOLN: 10.0000 mg | Freq: Four times a day (QID) | INTRAMUSCULAR | Status: DC | PRN

## 2022-05-15 MED ORDER — SODIUM CHLORIDE 0.9 % IV SOLN: INTRAVENOUS | Status: DC

## 2022-05-15 NOTE — Plan of Care (Signed)
  Problem: Education: Goal: Ability to describe self-care measures that may prevent or decrease complications (Diabetes Survival Skills Education) will improve Outcome: Progressing Goal: Individualized Educational Video(s) Outcome: Progressing   Problem: Coping: Goal: Ability to adjust to condition or change in health will improve Outcome: Progressing   Problem: Fluid Volume: Goal: Ability to maintain a balanced intake and output will improve Outcome: Progressing   Problem: Health Behavior/Discharge Planning: Goal: Ability to identify and utilize available resources and services will improve Outcome: Progressing Goal: Ability to manage health-related needs will improve Outcome: Progressing   Problem: Metabolic: Goal: Ability to maintain appropriate glucose levels will improve Outcome: Progressing   Problem: Nutritional: Goal: Maintenance of adequate nutrition will improve Outcome: Progressing Goal: Progress toward achieving an optimal weight will improve Outcome: Progressing   Problem: Skin Integrity: Goal: Risk for impaired skin integrity will decrease Outcome: Progressing   Problem: Tissue Perfusion: Goal: Adequacy of tissue perfusion will improve Outcome: Progressing   Problem: Education: Goal: Knowledge of General Education information will improve Description: Including pain rating scale, medication(s)/side effects and non-pharmacologic comfort measures Outcome: Progressing   Problem: Health Behavior/Discharge Planning: Goal: Ability to manage health-related needs will improve Outcome: Progressing   Problem: Clinical Measurements: Goal: Ability to maintain clinical measurements within normal limits will improve Outcome: Progressing Goal: Will remain free from infection Outcome: Progressing Goal: Diagnostic test results will improve Outcome: Progressing Goal: Respiratory complications will improve Outcome: Progressing Goal: Cardiovascular complication will  be avoided Outcome: Progressing   Problem: Activity: Goal: Risk for activity intolerance will decrease Outcome: Progressing   Problem: Nutrition: Goal: Adequate nutrition will be maintained Outcome: Progressing   Problem: Coping: Goal: Level of anxiety will decrease Outcome: Progressing   Problem: Elimination: Goal: Will not experience complications related to bowel motility Outcome: Progressing Goal: Will not experience complications related to urinary retention Outcome: Progressing   Problem: Pain Managment: Goal: General experience of comfort will improve Outcome: Progressing   Problem: Safety: Goal: Ability to remain free from injury will improve Outcome: Progressing   Problem: Skin Integrity: Goal: Risk for impaired skin integrity will decrease Outcome: Progressing   Problem: Education: Goal: Ability to verbalize activity precautions or restrictions will improve Outcome: Progressing Goal: Knowledge of the prescribed therapeutic regimen will improve Outcome: Progressing Goal: Understanding of discharge needs will improve Outcome: Progressing   Problem: Activity: Goal: Ability to avoid complications of mobility impairment will improve Outcome: Progressing Goal: Ability to tolerate increased activity will improve Outcome: Progressing Goal: Will remain free from falls Outcome: Progressing   Problem: Bowel/Gastric: Goal: Gastrointestinal status for postoperative course will improve Outcome: Progressing   Problem: Clinical Measurements: Goal: Ability to maintain clinical measurements within normal limits will improve Outcome: Progressing Goal: Postoperative complications will be avoided or minimized Outcome: Progressing Goal: Diagnostic test results will improve Outcome: Progressing   Problem: Pain Management: Goal: Pain level will decrease Outcome: Progressing   Problem: Skin Integrity: Goal: Will show signs of wound healing Outcome: Progressing    Problem: Health Behavior/Discharge Planning: Goal: Identification of resources available to assist in meeting health care needs will improve Outcome: Progressing   Problem: Bladder/Genitourinary: Goal: Urinary functional status for postoperative course will improve Outcome: Progressing   

## 2022-05-15 NOTE — Progress Notes (Signed)
Physical Therapy Treatment Patient Details Name: Jamie [name] MRN: [id number] DOB: 1955/10/06 Today's Date: 05/15/2022   History of Present Illness Pt is a 67 y/o F admitted on 05/10/22 after presenting with [name]/o a fall resulting in lower back pain. Pt found to have compression fx of L1 lumbar vertebra. Neurosurgery was consulted & pt underwent posterior thoracic fusion T11-L3 on 05/12/22. PMH: HTN, HLD, DM, CKD3a, OSA on CPAP, morbid obesity, anemia, gallstone, kidney stone, ulcerative colitis s/p colectomy & ileostomy, peripheral neuropathy    PT Comments    Pt at EOB on arrival, has just moved back to bed from recliner with husband at bedside- reports being up for ~3 hours since OT session. Pt reports to be exhausted, pain limited, wants to go back to bed rather than attempt any PT present. Pt does endorse needing help with return from EOB to supine, reports more help than her husband is able to provide. Pt educated on log role and posterior scoot, is pain limited, but very motivated. +2 modA (min of legs, min of trunk) for EOB to sidelying, then pt able to roll to back without assist. Pt unable to laterally shift her pelvis in the bed. Difficulty lifting her head for pillow adjustment. Pt educated on keeping HOB >30 degrees during the Jamie for pulmonary hygiene. All needs met, will continue to follow.   Recommendations for follow up therapy are one component of a multi-disciplinary discharge planning process, led by the attending physician.  Recommendations may be updated based on patient status, additional functional criteria and insurance authorization.  Follow Up Recommendations  Can patient physically be transported by private vehicle: No    Assistance Recommended at Discharge Frequent or constant Supervision/Assistance  Patient can return home with the following Two people to help with walking and/or transfers;Two people to help with bathing/dressing/bathroom;Help with stairs or ramp for  entrance;Assist for transportation;Assistance with cooking/housework   Equipment Recommendations  None recommended by PT    Recommendations for Other Services       Precautions / Restrictions Precautions Precautions: Fall;Back Required Braces or Orthoses: Spinal Brace Cervical Brace: For comfort Spinal Brace: Thoracolumbosacral orthotic;Applied in sitting position Restrictions Weight Bearing Restrictions: No     Mobility  Bed Mobility Overal bed mobility: Needs Assistance Bed Mobility: Sit to Supine       Sit to supine: +2 for physical assistance   General bed mobility comments: reviewed step-by-step log roll instructions for seated to supine; author assists with legs, husband is asked to asssit with trunk; pt rolls to hooklying without difficulty    Transfers Overall transfer level: Needs assistance   Transfers:  (at EOB, pt needs modA to perform posterior scoot, no cues needed for weight shifting, but she needs bed rail to weight shift)                  Ambulation/Gait                   Stairs             Wheelchair Mobility    Modified Rankin (Stroke Patients Only)       Balance                                            Cognition Arousal/Alertness: Awake/alert Behavior During Therapy: WFL for tasks assessed/performed Overall Cognitive Status: Within Functional Limits for  tasks assessed                                          Exercises      General Comments        Pertinent Vitals/Pain Pain Assessment Pain Assessment: Faces Faces Pain Scale: Hurts even more    Home Living                          Prior Function            PT Goals (current goals can now be found in the care plan section) Acute Rehab PT Goals Patient Stated Goal: decreased pain, get better PT Goal Formulation: With patient Time For Goal Achievement: 05/27/22 Potential to Achieve Goals: Good Progress  towards PT goals: Progressing toward goals    Frequency    7X/week      PT Plan Current plan remains appropriate    Co-evaluation              AM-PAC PT "6 Clicks" Mobility   Outcome Measure  Help needed turning from your back to your side while in a flat bed without using bedrails?: Total Help needed moving from lying on your back to sitting on the side of a flat bed without using bedrails?: Total Help needed moving to and from a bed to a chair (including a wheelchair)?: A Lot Help needed standing up from a chair using your arms (e.g., wheelchair or bedside chair)?: A Lot Help needed to walk in hospital room?: A Lot Help needed climbing 3-5 steps with a railing? : Total 6 Click Score: 9    End of Session   Activity Tolerance: Patient limited by pain;Patient limited by fatigue Patient left: with family/visitor present;in bed;with call bell/phone within reach Nurse Communication: Mobility status PT Visit Diagnosis: Muscle weakness (generalized) (M62.81);Pain;Difficulty in walking, not elsewhere classified (R26.2);Other abnormalities of gait and mobility (R26.89);Unsteadiness on feet (R26.81)     Time: 4098-1191 PT Time Calculation (min) (ACUTE ONLY): 12 min  Charges:  $Therapeutic Activity: 8-22 mins                    2:20 PM, 05/15/22 Jamie [name], PT, DPT Physical Therapist - [facility]  [phone] (ASCOM)    Jamie [name] 05/15/2022, 2:16 PM

## 2022-05-15 NOTE — Anesthesia Postprocedure Evaluation (Signed)
Anesthesia Post Note  Patient: Jamie [name]  Procedure(s) Performed: POSTERIOR THORACIC FUSION 4 LEVELS (Spine Thoracic)  Patient location during evaluation: PACU Anesthesia Type: General Level of consciousness: awake and alert Pain management: pain level controlled Vital Signs Assessment: post-procedure vital signs reviewed and stable Respiratory status: spontaneous breathing, nonlabored ventilation and respiratory function stable Cardiovascular status: blood pressure returned to baseline and stable Postop Assessment: no apparent nausea or vomiting Anesthetic complications: no   No notable events documented.   Last Vitals:  Vitals:   05/15/22 0714 05/15/22 0735  BP: (!) 160/63 (!) 175/57  Pulse: 85   Resp: 18   Temp: 36.7 C   SpO2: 97%     Last Pain:  Vitals:   05/15/22 0714  TempSrc: Oral  PainSc: 5                  Foye Deer

## 2022-05-15 NOTE — TOC Progression Note (Signed)
Transition of Care [facility]) - Progression Note    Patient Details  Name: Jamie [name] MRN: [id number] Date of Birth: September 29, 1955  [facility]) CM/SW Contact  Marlowe Sax, RN Phone Number: 05/15/2022, 2:26 PM  Clinical Narrative:    Met with the patient and her husband in the room, they bare agreeable to go to STR and have a Bedsearch sent, PASSR obtianed, FL2 completed, Bedsearch sent, will review once obtained        Expected Discharge Plan and Services                                               Social Determinants of Health (SDOH) Interventions SDOH Screenings   Food Insecurity: No Food Insecurity (05/13/2022)  Housing: Low Risk  (05/13/2022)  Transportation Needs: No Transportation Needs (05/13/2022)  Utilities: Not At Risk (05/13/2022)  Depression (PHQ2-9): Low Risk  (04/07/2022)  Financial Resource Strain: Low Risk  (05/31/2021)  Physical Activity: Inactive (05/31/2021)  Stress: No Stress Concern Present (05/31/2021)  Tobacco Use: Low Risk  (05/12/2022)    Readmission Risk Interventions     No data to display

## 2022-05-15 NOTE — Progress Notes (Signed)
PROGRESS NOTE    BRISA AUTH  ZOX:[id number] DOB: [birth year] DOA: 05/10/2022 PCP: Judy Pimple, MD   Assessment & Plan:   Principal Problem:   Compression fracture of L1 lumbar vertebra Active Problems:   Fall at home, initial encounter   Essential hypertension   Ulcerative colitis   DM (diabetes mellitus), type 2 with renal complications   Hyperlipidemia associated with type 2 diabetes mellitus   Chronic kidney disease, stage 3a   Leukocytosis   OSA (obstructive sleep apnea)   Morbid obesity   Closed tricolumnar fracture of lumbar vertebra  Assessment and Plan:  Compression fracture of L1 lumbar vertebra: s/p open reduction internal fixation of T12 & L1 fractures & T-11-L3 posterior arthrodesis as per neuro surg. Continue w/ lidocaine patch. Percocet, morphine prn for pain. Neuro surg following and recs apprec. OT/PT recs SNF. Pt is agreeable to SNF  Bladder spasms: mirabegon prn. Much improved  Fall: at home. Fall precautions    HTN: continue on metoprolol. Holding losartan   Ulcerative colitis: s/p of colectomy and ileostomy. No acute issues    DM2: well controlled, HbA1c 6.4. Continue on SSI w/ accuchecks   HLD: continue on statin     AKI on CKDIIIa: Cr is trending up significantly today. Holding losartan. Restart IVFs    Leukocytosis: likely reactive. Labile    OSA: continue on BiPAP    Morbid obesity: BMI 43.4. Complicates overall & prognosis         DVT prophylaxis: SCDs, heparin SQ Code Status: full  Family Communication: discussed pt's care w/ pt's family at bedside and answered his questions  Disposition Plan: therapy recs SNF   Level of care: Med-Surg  Status is: Inpatient Remains inpatient appropriate because: severity of illness   Consultants:  Neuro surg   Procedures:   Antimicrobials:  Subjective: Pt c/o gas pains  Objective: Vitals:   05/14/22 1526 05/14/22 2259 05/15/22 0714 05/15/22 0735  BP: (!) 155/58 (!) 155/59 (!)  160/63 (!) 175/57  Pulse: 74 80 85   Resp: Temp: 98 F (36.7 C) 98 F (36.7 C) 98.1 F (36.7 C)   TempSrc:   Oral   SpO2: 100% 97% 97%   Weight:      Height:        Intake/Output Summary (Last 24 hours) at 05/15/2022 0754 Last data filed at 05/15/2022 0129 Gross per 24 hour  Intake 240 ml  Output 620 ml  Net -380 ml   Filed Weights   05/11/22 1628 05/12/22 0701  Weight: 120.2 kg 120.2 kg    Examination:  General exam: Appears calm but uncomfortable  Respiratory system:  clear breath sounds b/l Cardiovascular system: S1/S2+. No rubs or gallops  Gastrointestinal system: abd is soft, NT, obese, hypoactive bowel sounds, ostomy present  Central nervous system: alert and oriented. Moves all extremities  Psychiatry: judgement and insight appears normal. Flat mood and affect    Data Reviewed: I have personally reviewed following labs and imaging studies  CBC: Recent Labs  Lab 05/11/22 1144 05/13/22 0500 05/14/22 0458 05/15/22 0416  WBC 13.5* 15.7* 13.1* 15.1*  HGB 13.8 12.4 12.3 12.7  HCT 42.2 37.6 37.0 38.5  MCV 97.5 98.4 96.9 96.7  PLT 223 177 200 246   Basic Metabolic Panel: Recent Labs  Lab 05/11/22 1144 05/12/22 0424 05/13/22 0500 05/14/22 0458 05/15/22 0416  NA 133* 135 137 131* 131*  K 4.6 4.0 4.4 4.2 4.4  CL 103 104 106 103  101  CO2 20* 21* 20* 20* 19*  GLUCOSE 167* 161* 174* 235* 229*  BUN 28* 46*  CREATININE 1.82* 1.43* 1.22* 1.67* 3.29*  CALCIUM 9.2 8.9 8.4* 8.6* 8.6*   GFR: Estimated Creatinine Clearance: 21.7 mL/min (A) (by C-G formula based on SCr of 3.29 mg/dL (H)). Liver Function Tests: No results for input(s): "AST", "ALT", "ALKPHOS", "BILITOT", "PROT", "ALBUMIN" in the last 168 hours. No results for input(s): "LIPASE", "AMYLASE" in the last 168 hours. No results for input(s): "AMMONIA" in the last 168 hours. Coagulation Profile: Recent Labs  Lab 05/11/22 1745  INR 1.1   Cardiac Enzymes: No results for  input(s): "CKTOTAL", "CKMB", "CKMBINDEX", "TROPONINI" in the last 168 hours. BNP (last 3 results) No results for input(s): "PROBNP" in the last 8760 hours. HbA1C: No results for input(s): "HGBA1C" in the last 72 hours. CBG: Recent Labs  Lab 05/14/22 0808 05/14/22 1211 05/14/22 1649 05/14/22 2125 05/15/22 0749  GLUCAP 232* 220* 216* 194* 221*   Lipid Profile: No results for input(s): "CHOL", "HDL", "LDLCALC", "TRIG", "CHOLHDL", "LDLDIRECT" in the last 72 hours. Thyroid Function Tests: No results for input(s): "TSH", "T4TOTAL", "FREET4", "T3FREE", "THYROIDAB" in the last 72 hours. Anemia Panel: No results for input(s): "VITAMINB12", "FOLATE", "FERRITIN", "TIBC", "IRON", "RETICCTPCT" in the last 72 hours. Sepsis Labs: No results for input(s): "PROCALCITON", "LATICACIDVEN" in the last 168 hours.  No results found for this or any previous visit (from the past 240 hour(s)).       Radiology Studies: DG THORACOLUMABAR SPINE  Result Date: 05/14/2022 CLINICAL DATA:  [id number] Postop check [id number] EXAM: THORACOLUMBAR SPINE 1V COMPARISON:  05/10/2022 FINDINGS: Bilateral pedicle screws T11, T12, L2, L3 with vertical interconnecting rods, intact without surrounding lucency. Mild compression deformity L1 vertebral body as before. Alignment is preserved. Aortic Atherosclerosis (ICD10-170.0). Surgical clips right abdomen. Fluid levels in nondilated bowel. IMPRESSION: 1. T11-L3 fusion without apparent complication. 2. Stable L1 compression deformity. Electronically Signed   By: Corlis Leak M.D.   On: 05/14/2022 09:38        Scheduled Meds:  atorvastatin  5 mg Oral QODAY   cholecalciferol  1,000 Units Oral Daily   fluticasone  2 spray Each Nare QHS   heparin  5,000 Units Subcutaneous Q8H   insulin aspart  0-5 Units Subcutaneous QHS   insulin aspart  0-9 Units Subcutaneous TID WC   lidocaine  1 patch Transdermal Q24H   metoprolol tartrate  25 mg Oral Daily   multivitamin with minerals  1  tablet Oral Daily   pantoprazole  40 mg Oral Daily   sodium chloride flush  3 mL Intravenous Q12H   Continuous Infusions:  sodium chloride     sodium chloride       LOS: 4 days    Time spent: 25 mins     Charise Killian, MD [facility] Pager [phone]  If 7PM-7AM, please contact night-coverage [email or web address] 05/15/2022, 7:54 AM

## 2022-05-15 NOTE — Inpatient Diabetes Management (Signed)
Inpatient Diabetes Program Recommendations  AACE/ADA: New Consensus Statement on Inpatient Glycemic Control (2015)  Target Ranges:  Prepandial:   less than 140 mg/dL      Peak postprandial:   less than 180 mg/dL (1-2 hours)      Critically ill patients:  140 - 180 mg/dL    Latest Reference Range & Units 01/31/22 08:56  Hemoglobin A1C 4.6 - 6.5 % 6.4    Latest Reference Range & Units 05/14/22 08:08 05/14/22 12:11 05/14/22 16:49 05/14/22 21:25  Glucose-Capillary 70 - 99 mg/dL 161 (H)  3 units Novolog  220 (H)  3 units Novolog  216 (H)  3 units Novolog  194 (H)  (H): Data is abnormally high  Latest Reference Range & Units 05/15/22 07:49  Glucose-Capillary 70 - 99 mg/dL 096 (H)  3 units Novolog   (H): Data is abnormally high   Admit with: Compression fracture of L1 lumbar vertebra after Fall  History: DM2  Home DM Meds: Glipizide 5 mg daily        Mounjaro 5 mg Qweek  Current Orders: Novolog Sensitive Correction Scale/ SSI (0-9 units) TID AC + HS    MD- Note home DM meds are on hold.  AM CBGs >200 the last 2 days.  Please consider starting basal insulin for in-hospital CBG control:  Semglee 10 units QHS (~0.1 units/kg)     --Will follow patient during hospitalization--  Ambrose Finland RN, MSN, CDCES Diabetes Coordinator Inpatient Glycemic Control Team Team Pager: [phone] (8a-5p)

## 2022-05-15 NOTE — Progress Notes (Signed)
Attending Progress Note  History: Jamie [name] is here for an unstable L1 3 column fracture.  Chance type.  Status post percutaneous fusion.  POD1: Left LFCN [place], expected postop soreness POD2: Left LFNC Numbness improved significantly. Overall mobility improved. Xrays completed. Worked with PT POD4: continued expected back pain   Physical Exam: Vitals:   05/15/22 0714 05/15/22 0735  BP: (!) 160/63 (!) 175/57  Pulse: 85   Resp: 18   Temp: 98.1 F (36.7 C)   SpO2: 97%     AA Ox3 CNI  Strength:5/5 throughout  Incisions c/d/I with Dermabond in place.  Data:  Recent Labs  Lab 05/13/22 0500 05/14/22 0458 05/15/22 0416  NA 137 131* 131*  K 4.4 4.2 4.4  CL 106 103 101  CO2 20* 20* 19*  BUN 16 28* 46*  CREATININE 1.22* 1.67* 3.29*  GLUCOSE 174* 235* 229*  CALCIUM 8.4* 8.6* 8.6*    No results for input(s): "AST", "ALT", "ALKPHOS" in the last 168 hours.  Invalid input(s): "TBILI"   Recent Labs  Lab 05/13/22 0500 05/14/22 0458 05/15/22 0416  WBC 15.7* 13.1* 15.1*  HGB 12.4 12.3 12.7  HCT 37.6 37.0 38.5  PLT 177 200 246    Recent Labs  Lab 05/11/22 1745  APTT 24  INR 1.1           Other tests/results: Instrumentatino in place, no progressive of deformity.     Assessment/Plan:  Jamie [name] is a 67 year old woman with a history of a 3 column L1 vertebral fracture that was unstable.  This required posterior percutaneous fusion.   - mobilize.  Discussed the importance of working with physical therapy with the patient this morning.  Continue Brace while OOB for 6 weeks.  - Xrays completed and appear stable, non need for further imaging.  - pain control - DVT prophylaxis - PTOT -Remainder of care per primary team.  Will follow-up with the patient for regular scheduled postop visit.  Manning Charity PA-C Department of Neurosurgery

## 2022-05-15 NOTE — NC FL2 (Signed)
[place] MEDICAID FL2 LEVEL OF CARE FORM     IDENTIFICATION  Patient Name: Jamie [name] Birthdate: 03/15/1955 Sex: female Admission Date (Current Location): 05/10/2022  [facility] and [place] Number:  Chiropodist and Address:  [facility], [address], [place], [place] [address]      Provider Number: [id number]  Attending Physician Name and Address:  Charise Killian, MD  Relative Name and Phone Number:  Jamie [name] spouse [phone]    Current Level of Care: Hospital Recommended Level of Care: Skilled Nursing Facility Prior Approval Number:    Date Approved/Denied:   PASRR Number: [id number]  Discharge Plan: SNF    Current Diagnoses: Patient Active Problem List   Diagnosis Date Noted   Closed tricolumnar fracture of lumbar vertebra 05/12/2022   Compression fracture of L1 lumbar vertebra 05/11/2022   Morbid obesity 05/11/2022   Fall at home, initial encounter 05/11/2022   Chronic kidney disease, stage 3a 05/11/2022   Closed fracture of first lumbar vertebra 05/11/2022   Closed fracture of twelfth thoracic vertebra 05/11/2022   Spinal instability, lumbar 05/11/2022   Leukocytosis 05/11/2022   Hearing loss, right 04/07/2022   Sinus congestion 04/07/2022   Abnormal SPEP 04/28/2021   Abnormal EKG 04/26/2021   Ileostomy dysfunction 08/31/2020   CKD (chronic kidney disease) stage 3, GFR 30-59 ml/min 05/31/2020   Aortic atherosclerosis 03/01/2020   Urinary frequency 12/23/2019   Elevated serum creatinine 05/14/2019   Dermatitis 07/01/2018   Left shoulder pain 11/30/2017   Palpitations 07/05/2017   Ostomy nurse consultation 06/05/2017   Lichen planus 04/09/2017   Hyperlipidemia associated with type 2 diabetes mellitus 04/10/2016   Obesity (BMI 30-39.9) 01/08/2015   Red blood cell antibody positive 07/02/2013   Elevated C-reactive protein (CRP) 05/15/2012   Routine general medical examination at a health care facility  09/24/2011   Apnea 11/14/2010   Nevus of lower leg 11/14/2010   Other screening mammogram 10/31/2010   Stress reaction, emotional 05/02/2010   Compulsive overeater 05/02/2010   Arthritis    H/O small bowel obstruction    KNEE PAIN 10/14/2009   DM (diabetes mellitus), type 2 with renal complications 04/23/2009   ROSACEA 09/12/2007   Essential hypertension 05/15/2007   OSA (obstructive sleep apnea) 12/13/2006   Ulcerative colitis 07/12/2006    Orientation RESPIRATION BLADDER Height & Weight     Self, Time, Situation, Place  O2 (2 liters) Continent, External catheter Weight: 120.2 kg Height:  5' 5.5" (166.4 cm)  BEHAVIORAL SYMPTOMS/MOOD NEUROLOGICAL BOWEL NUTRITION STATUS      Continent Diet (see dc summary)  AMBULATORY STATUS COMMUNICATION OF NEEDS Skin   Extensive Assist Verbally Normal, Surgical wounds                       Personal Care Assistance Level of Assistance  Bathing, Feeding, Dressing Bathing Assistance: Limited assistance Feeding assistance: Limited assistance Dressing Assistance: Maximum assistance     Functional Limitations Info  Sight, Hearing, Speech Sight Info: Adequate Hearing Info: Adequate Speech Info: Adequate    SPECIAL CARE FACTORS FREQUENCY                       Contractures Contractures Info: Not present    Additional Factors Info  Code Status, Allergies Code Status Info: Full code Allergies Info: Ciprofloxacin, Other, Jardiance (Empagliflozin), Lisinopril, Metformin And Related, Sulfa Antibiotics           Current Medications (05/15/2022):  This is the  current hospital active medication list Current Facility-Administered Medications  Medication Dose Route Frequency Provider Last Rate Last Admin   0.9 %  sodium chloride infusion  250 mL Intravenous Continuous Susanne Borders, PA       0.9 %  sodium chloride infusion   Intravenous Continuous Charise Killian, MD 75 mL/hr at 05/15/22 0940 New Bag at 05/15/22 0940    acetaminophen (TYLENOL) tablet 650 mg  650 mg Oral Q6H PRN Susanne Borders, PA       albuterol (PROVENTIL) (2.5 MG/3ML) 0.083% nebulizer solution 3 mL  3 mL Nebulization Q4H PRN Susanne Borders, PA       atorvastatin (LIPITOR) tablet 5 mg  5 mg Oral QODAY Susanne Borders, PA   5 mg at 05/14/22 6045   cholecalciferol (VITAMIN D3) 25 MCG (1000 UNIT) tablet 1,000 Units  1,000 Units Oral Daily Susanne Borders, PA   1,000 Units at 05/15/22 4098   dextromethorphan-guaiFENesin (MUCINEX DM) 30-600 MG per 12 hr tablet 1 tablet  1 tablet Oral BID PRN Susanne Borders, PA       fluticasone [facility]) 50 MCG/ACT nasal spray 2 spray  2 spray Each Nare QHS Susanne Borders, PA   2 spray at 05/14/22 2149   heparin injection 5,000 Units  5,000 Units Subcutaneous Q8H Charise Killian, MD   5,000 Units at 05/14/22 2149   hydrALAZINE (APRESOLINE) injection 10 mg  10 mg Intravenous Q6H PRN Charise Killian, MD       insulin aspart (novoLOG) injection 0-5 Units  0-5 Units Subcutaneous QHS Susanne Borders, PA   2 Units at 05/13/22 2213   insulin aspart (novoLOG) injection 0-9 Units  0-9 Units Subcutaneous TID WC Susanne Borders, PA   5 Units at 05/15/22 1246   lidocaine (LIDODERM) 5 % 1 patch  1 patch Transdermal Q24H Susanne Borders, PA       melatonin tablet 2.5 mg  2.5 mg Oral QHS PRN Susanne Borders, PA   2.5 mg at 05/14/22 2152   menthol-cetylpyridinium (CEPACOL) lozenge 3 mg  1 lozenge Oral PRN Susanne Borders, PA       Or   phenol (CHLORASEPTIC) mouth spray 1 spray  1 spray Mouth/Throat PRN Susanne Borders, PA       methocarbamol (ROBAXIN) tablet 500 mg  500 mg Oral Q8H PRN Susanne Borders, PA   500 mg at 05/12/22 2159   metoprolol tartrate (LOPRESSOR) tablet 25 mg  25 mg Oral Daily Susanne Borders, [place]   25 mg at 05/15/22 1191   mirabegron ER (MYRBETRIQ) tablet 25 mg  25 mg Oral Daily PRN Charise Killian, MD       morphine (PF) 2 MG/ML injection 2 mg  2 mg  Intravenous Q4H PRN Susanne Borders, PA   2 mg at 05/14/22 4782   multivitamin with minerals tablet 1 tablet  1 tablet Oral Daily Susanne Borders, PA   1 tablet at 05/15/22 0951   ondansetron (ZOFRAN) injection 4 mg  4 mg Intravenous Q8H PRN Susanne Borders, PA       oxyCODONE-acetaminophen (PERCOCET/ROXICET) 5-325 MG per tablet 1 tablet  1 tablet Oral Q4H PRN Susanne Borders, PA   1 tablet at 05/15/22 9562   simethicone (MYLICON) chewable tablet 80 mg  80 mg Oral QID PRN Charise Killian, MD   80 mg at 05/15/22 1045   sodium chloride flush (NS) 0.9 % injection  3 mL  3 mL Intravenous Q12H Susanne Borders, [place]   3 mL at 05/15/22 1017   sodium chloride flush (NS) 0.9 % injection 3 mL  3 mL Intravenous PRN Susanne Borders, PA         Discharge Medications: Please see discharge summary for a list of discharge medications.  Relevant Imaging Results:  Relevant Lab Results:   Additional Information SS# [id number]  Marlowe Sax, RN

## 2022-05-15 NOTE — Progress Notes (Signed)
Occupational Therapy Treatment Patient Details Name: Jamie [name] MRN: [id number] DOB: January 10, 1956 Today's Date: 05/15/2022   History of present illness Pt is a 67 y/o F admitted on 05/10/22 after presenting with c/o a fall resulting in lower back pain. Pt found to have compression fx of L1 lumbar vertebra. Neurosurgery was consulted & pt underwent posterior thoracic fusion T11-L3 on 05/12/22. PMH: HTN, HLD, DM, CKD3a, OSA on CPAP, morbid obesity, anemia, gallstone, kidney stone, ulcerative colitis s/p colectomy & ileostomy, peripheral neuropathy   OT comments  Pt seen for OT tx. Pt received supine, spouse present. Pt appears anxious and endorses apprehension due to anticipated pain with mobility. Pt/spouse educated in log roll to improve recall and carryover from previous date, requiring MOD A and intermittent VC to improve technique. Seated EOB pt initially a bit nauseated but improved with time. Pt/spouse educated in AE for LB ADL tasks. Pt declined TLSO. Pt required MIN A for STS from elevated bed (has adjustable bed at home) with heavy reliance on the RW. Took a few short steps to sit on the recliner with VC for hand placement. Pt progressing towards goals. Continues to require significant assist for bed mobility and ADL tasks. Recommendation remains appropriate.    Recommendations for follow up therapy are one component of a multi-disciplinary discharge planning process, led by the attending physician.  Recommendations may be updated based on patient status, additional functional criteria and insurance authorization.    Assistance Recommended at Discharge Frequent or constant Supervision/Assistance  Patient can return home with the following  A little help with walking and/or transfers;A lot of help with bathing/dressing/bathroom;Assistance with cooking/housework;Help with stairs or ramp for entrance;Assist for transportation   Equipment Recommendations  BSC/3in1;Hospital bed     Recommendations for Other Services      Precautions / Restrictions Precautions Precautions: Fall;Back Required Braces or Orthoses: Spinal Brace Cervical Brace: For comfort Spinal Brace: Thoracolumbosacral orthotic;Applied in sitting position Restrictions Weight Bearing Restrictions: No       Mobility Bed Mobility Overal bed mobility: Needs Assistance Bed Mobility: Rolling, Sidelying to Sit Rolling: Mod assist Sidelying to sit: Mod assist, HOB elevated       General bed mobility comments: VC for sequencing, heavy UE support on bed rail and therapist    Transfers Overall transfer level: Needs assistance Equipment used: Rolling walker (2 wheels) Transfers: Sit to/from Stand Sit to Stand: From elevated surface, Min assist                 Balance Overall balance assessment: Needs assistance Sitting-balance support: Feet supported, Bilateral upper extremity supported Sitting balance-Leahy Scale: Fair Sitting balance - Comments: pain limited and mildly nauseated   Standing balance support: Bilateral upper extremity supported, During functional activity, Reliant on assistive device for balance Standing balance-Leahy Scale: Poor Standing balance comment: heavy reliance on walker for support                           ADL either performed or assessed with clinical judgement   ADL                                         General ADL Comments: Pt/spouse educated in AE for LB ADL tasks. Pt declined TLSO during ADL mobility    Extremity/Trunk Assessment  Vision       Perception     Praxis      Cognition Arousal/Alertness: Awake/alert Behavior During Therapy: WFL for tasks assessed/performed Overall Cognitive Status: Within Functional Limits for tasks assessed                                 General Comments: mildly anxious about moving        Exercises Other Exercises Other Exercises: Pt/spouse  instructed in AE for LB ADL, log roll, TLSO for comfort (pt declined to use), ADL transfers    Shoulder Instructions       General Comments      Pertinent Vitals/ Pain       Pain Assessment Pain Assessment: 0-10 Pain Score: 6  Pain Location: 3/10 at rest, 6/10 with mobility, improving to 4/10 once seated in recliner Pain Descriptors / Indicators: Grimacing, Guarding, Discomfort Pain Intervention(s): Limited activity within patient's tolerance, Monitored during session, Premedicated before session, Repositioned  Home Living                                          Prior Functioning/Environment              Frequency  Min 3X/week        Progress Toward Goals  OT Goals(current goals can now be found in the care plan section)  Progress towards OT goals: Progressing toward goals  Acute Rehab OT Goals Patient Stated Goal: to improve pain OT Goal Formulation: With patient Time For Goal Achievement: 05/27/22 Potential to Achieve Goals: Good  Plan Discharge plan remains appropriate;Frequency remains appropriate    Co-evaluation                 AM-PAC OT "6 Clicks" Daily Activity     Outcome Measure   Help from another person eating meals?: None Help from another person taking care of personal grooming?: A Little Help from another person toileting, which includes using toliet, bedpan, or urinal?: A Lot Help from another person bathing (including washing, rinsing, drying)?: A Lot Help from another person to put on and taking off regular upper body clothing?: A Lot Help from another person to put on and taking off regular lower body clothing?: A Lot 6 Click Score: 15    End of Session Equipment Utilized During Treatment: Rolling walker (2 wheels)  OT Visit Diagnosis: Other abnormalities of gait and mobility (R26.89);Muscle weakness (generalized) (M62.81)   Activity Tolerance Patient tolerated treatment well   Patient Left in chair;with  call bell/phone within reach;with family/visitor present   Nurse Communication          Time: 1610-9604 OT Time Calculation (min): 24 min  Charges: OT General Charges $OT Visit: 1 Visit OT Treatments $Self Care/Home Management : 23-37 mins  Arman Filter., MPH, MS, OTR/L ascom [phone] 05/15/22, 12:34 PM

## 2022-05-16 ENCOUNTER — Inpatient Hospital Stay: Payer: Medicare PPO

## 2022-05-16 ENCOUNTER — Encounter: Payer: Self-pay | Admitting: Neurosurgery

## 2022-05-16 DIAGNOSIS — S32010A Wedge compression fracture of first lumbar vertebra, initial encounter for closed fracture: Secondary | ICD-10-CM | POA: Diagnosis not present

## 2022-05-16 DIAGNOSIS — N1831 Chronic kidney disease, stage 3a: Secondary | ICD-10-CM | POA: Diagnosis not present

## 2022-05-16 LAB — URINALYSIS, COMPLETE (UACMP) WITH MICROSCOPIC
Bilirubin Urine: NEGATIVE
Glucose, UA: NEGATIVE mg/dL
Ketones, ur: NEGATIVE mg/dL
Nitrite: NEGATIVE
Protein, ur: NEGATIVE mg/dL
RBC / HPF: >50 RBC/hpf (ref 0–5)
Specific Gravity, Urine: 1.01 (ref 1.005–1.030)
pH: 5 (ref 5.0–8.0)

## 2022-05-16 LAB — BASIC METABOLIC PANEL
Anion gap: 13 (ref 5–15)
BUN: 67 mg/dL — ABNORMAL HIGH (ref 8–23)
CO2: 17 mmol/L — ABNORMAL LOW (ref 22–32)
Calcium: 8.2 mg/dL — ABNORMAL LOW (ref 8.9–10.3)
Chloride: 99 mmol/L (ref 98–111)
Creatinine, Ser: 5.43 mg/dL — ABNORMAL HIGH (ref 0.44–1.00)
GFR, Estimated: 8 mL/min — ABNORMAL LOW (ref >60)
Glucose, Bld: 229 mg/dL — ABNORMAL HIGH (ref 70–99)
Potassium: 4.5 mmol/L (ref 3.5–5.1)
Sodium: 129 mmol/L — ABNORMAL LOW (ref 135–145)

## 2022-05-16 LAB — CBC
HCT: 37 % (ref 36.0–46.0)
Hemoglobin: 12.2 g/dL (ref 12.0–15.0)
MCH: 31.5 pg (ref 26.0–34.0)
MCHC: 33 g/dL (ref 30.0–36.0)
MCV: 95.6 fL (ref 80.0–100.0)
Platelets: 244 10*3/uL (ref 150–400)
RBC: 3.87 MIL/uL (ref 3.87–5.11)
RDW: 13.3 % (ref 11.5–15.5)
WBC: 16.2 10*3/uL — ABNORMAL HIGH (ref 4.0–10.5)
nRBC: 0 % (ref 0.0–0.2)

## 2022-05-16 LAB — GLUCOSE, CAPILLARY
Glucose-Capillary: 208 mg/dL — ABNORMAL HIGH (ref 70–99)
Glucose-Capillary: 239 mg/dL — ABNORMAL HIGH (ref 70–99)
Glucose-Capillary: 300 mg/dL — ABNORMAL HIGH (ref 70–99)
Glucose-Capillary: 240 mg/dL — ABNORMAL HIGH (ref 70–99)

## 2022-05-16 MED ORDER — SODIUM BICARBONATE 8.4 % IV SOLN
INTRAVENOUS | Status: DC
  Filled 2022-05-16: qty 1000
  Filled 2022-05-16 (×4): qty 150
  Filled 2022-05-16 (×2): qty 1000

## 2022-05-16 MED ORDER — MIRABEGRON ER 25 MG PO TB24: 25.0000 mg | ORAL_TABLET | Freq: Every day | ORAL | Status: DC | PRN

## 2022-05-16 NOTE — Progress Notes (Signed)
[facility]  ROUNDING NOTE   Subjective:   [name] was admitted to [facility] on 05/10/2022 for Compression fracture of L1 lumbar vertebra [S32.010A] Compression fracture of lumbar vertebra, unspecified lumbar vertebral level, initial encounter [S32.000A]  Husband at bedside.  Patient had a fall at church where is was found to have TT12 and L1 fracture. Requiring posterior thoracic fusion of T11-L3 by Dr. Myer Haff on 4/19.   Patient's renal function was at baseline at 1.38 on admission. Denies use of nonsteroidal anti-inflammatory agents.   Patient was continued on losartan until 4/21.   Objective:  Vital signs in last 24 hours:  Temp:  [98.1 F (36.7 C)-98.4 F (36.9 C)] 98.1 F (36.7 C) (04/23 0751) Pulse Rate:  [74-86] 86 (04/23 0751) Resp:  [16-18] 18 (04/23 0751) BP: (132-160)/(53-88) 132/88 (04/23 0810) SpO2:  [96 %-100 %] 98 % (04/23 0751) FiO2 (%):  [21 %] 21 % (04/22 1936)  Weight change:  Filed Weights   05/11/22 1628 05/12/22 0701  Weight: 120.2 kg 120.2 kg    Intake/Output: I/O last 3 completed shifts: In: 2162.4 [P.O.:600; I.V.:1562.4] Out: 100 [Stool:100]   Intake/Output this shift:  No intake/output data recorded.  Physical Exam: General: NAD, sitting in chair  Head: Normocephalic, atraumatic. Moist oral mucosal membranes  Eyes: Anicteric, PERRL  Neck: Supple, trachea midline  Lungs:  Clear to auscultation  Heart: Regular rate and rhythm  Abdomen:  Soft, nontender,   Extremities:  no peripheral edema.  Neurologic: Nonfocal, moving all four extremities  Skin: No lesions        Basic Metabolic Panel: Recent Labs  Lab 05/12/22 0424 05/13/22 0500 05/14/22 0458 05/15/22 0416 05/16/22 0638  NA 135 137 131* 131* 129*  K 4.0 4.4 4.2 4.4 4.5  CL 104 106 103 101 99  CO2 21* 20* 20* 19* 17*  GLUCOSE 161* 174* 235* 229* 229*  BUN 21 16 28* 46* 67*  CREATININE 1.43* 1.22* 1.67* 3.29* 5.43*  CALCIUM 8.9 8.4* 8.6* 8.6* 8.2*     Liver Function Tests: No results for input(s): "AST", "ALT", "ALKPHOS", "BILITOT", "PROT", "ALBUMIN" in the last 168 hours. No results for input(s): "LIPASE", "AMYLASE" in the last 168 hours. No results for input(s): "AMMONIA" in the last 168 hours.  CBC: Recent Labs  Lab 05/11/22 1144 05/13/22 0500 05/14/22 0458 05/15/22 0416 05/16/22 0638  WBC 13.5* 15.7* 13.1* 15.1* 16.2*  HGB 13.8 12.4 12.3 12.7 12.2  HCT 42.2 37.6 37.0 38.5 37.0  MCV 97.5 98.4 96.9 96.7 95.6  PLT 223 177 200 246 244    Cardiac Enzymes: No results for input(s): "CKTOTAL", "CKMB", "CKMBINDEX", "TROPONINI" in the last 168 hours.  BNP: Invalid input(s): "POCBNP"  CBG: Recent Labs  Lab 05/15/22 1131 05/15/22 1650 05/15/22 2121 05/16/22 0733 05/16/22 1106  GLUCAP 283* 214* 207* 208* 239*    Microbiology: Results for orders placed or performed during the hospital encounter of 05/30/21  Urine Culture     Status: Abnormal   Collection Time: 05/30/21  5:11 AM   Specimen: Urine, Clean Catch  Result Value Ref Range Status   Specimen Description   Final    URINE, CLEAN CATCH Performed at [facility], [address] [place], [place] [address]    Special Requests   Final    NONE Performed at [facility], [address], [place], [place] [address]    Culture MULTIPLE SPECIES PRESENT, SUGGEST RECOLLECTION (A)  Final   Report Status 05/31/2021 FINAL  Final  Coagulation Studies: No results for input(s): "LABPROT", "INR" in the last 72 hours.  Urinalysis: No results for input(s): "COLORURINE", "LABSPEC", "PHURINE", "GLUCOSEU", "HGBUR", "BILIRUBINUR", "KETONESUR", "PROTEINUR", "UROBILINOGEN", "NITRITE", "LEUKOCYTESUR" in the last 72 hours.  Invalid input(s): "APPERANCEUR"    Imaging: No results found.   Medications:    sodium chloride     sodium bicarbonate 150 mEq in dextrose 5 % 1,150 mL infusion      atorvastatin  5 mg Oral QODAY   cholecalciferol  1,000  Units Oral Daily   fluticasone  2 spray Each Nare QHS   heparin  5,000 Units Subcutaneous Q8H   insulin aspart  0-5 Units Subcutaneous QHS   insulin aspart  0-9 Units Subcutaneous TID WC   lidocaine  1 patch Transdermal Q24H   metoprolol tartrate  25 mg Oral Daily   multivitamin with minerals  1 tablet Oral Daily   sodium chloride flush  3 mL Intravenous Q12H   acetaminophen, albuterol, dextromethorphan-guaiFENesin, hydrALAZINE, melatonin, menthol-cetylpyridinium **OR** phenol, methocarbamol, mirabegron ER, morphine injection, ondansetron (ZOFRAN) IV, oxyCODONE-acetaminophen, simethicone, sodium chloride flush  Assessment/ Plan:  [name] is a 67 y.o. white female with diabetes mellitus type II noninsulin dependent, hypertension, nephrolithiasis, sleep apnea, GERD, hyperlipidemia, who is admitted to [facility] on 05/10/2022 for Compression fracture of L1 lumbar vertebra [S32.010A] Compression fracture of lumbar vertebra, unspecified lumbar vertebral level, initial encounter [S32.000A]  Acute kidney injury with acute metabolic acidosis and hyponatremia on chronic kidney disease stage IIIB with proteinuria secondary to diabetic nephropathy and hypertension. Baseline creatinine of 1.32, GFR of 44 on 04/04/22. No IV contrast exposure. Most likely secondary to her back surgery.  - holding losartan - Check renal ultrasound.  - repeat urinalysis - change IV fluids to bicarb with dextrose.  - no acute indication for dialysis.   Hypertension with chronic kidney disease: 132/88. Elevated readings. Patient with pain. Home regimen of losartan and metoprolol. - holding losartan - continue metoprolol  Diabetes mellitus type II with chronic kidney disease: noninsulin dependent. Hemoglobin A1c of 6.4% on 01/31/22. Placed on sliding scale insulin. Not on an SGLT-2 inhibitor as outpatient due to history of acute kidney injury to empagliflozin.    LOS: 5 Jamie [name] 4/23/202411:24 AM

## 2022-05-16 NOTE — TOC Progression Note (Signed)
Transition of Care [facility]) - Progression Note    Patient Details  Name: Jamie [name] MRN: [id number] Date of Birth: 12/25/1955  Transition of Care [facility]) CM/SW Contact  Marlowe Sax, RN Phone Number: 05/16/2022, 9:47 AM  Clinical Narrative:     Met with the patient and her husband and reviewed the bed offer, she chose [facility], I notified Tammy at [facility], Ins auth pending  Expected Discharge Plan: Skilled Nursing Facility Barriers to Discharge: Insurance Authorization  Expected Discharge Plan and Services   Discharge Planning Services: CM Consult Post Acute Care Choice: Home Health Living arrangements for the past 2 months: Single Family Home                 DME Arranged: N/A DME Agency: NA       HH Arranged: NA           Social Determinants of Health (SDOH) Interventions SDOH Screenings   Food Insecurity: No Food Insecurity (05/13/2022)  Housing: Low Risk  (05/13/2022)  Transportation Needs: No Transportation Needs (05/13/2022)  Utilities: Not At Risk (05/13/2022)  Depression (PHQ2-9): Low Risk  (04/07/2022)  Financial Resource Strain: Low Risk  (05/31/2021)  Physical Activity: Inactive (05/31/2021)  Stress: No Stress Concern Present (05/31/2021)  Tobacco Use: Low Risk  (05/12/2022)    Readmission Risk Interventions     No data to display

## 2022-05-16 NOTE — TOC Progression Note (Signed)
Transition of Care [facility]) - Progression Note    Patient Details  Name: Jamie [name] MRN: [id number] Date of Birth: 09-14-1955  Transition of Care [facility]) CM/SW Contact  Marlowe Sax, RN Phone Number: 05/16/2022, 12:41 PM  Clinical Narrative:     Ins approved 4/23-4/25 REF ID number [id number]  Expected Discharge Plan: Skilled Nursing Facility Barriers to Discharge: Insurance Authorization  Expected Discharge Plan and Services   Discharge Planning Services: CM Consult Post Acute Care Choice: Home Health Living arrangements for the past 2 months: Single Family Home                 DME Arranged: N/A DME Agency: NA       HH Arranged: NA           Social Determinants of Health (SDOH) Interventions SDOH Screenings   Food Insecurity: No Food Insecurity (05/13/2022)  Housing: Low Risk  (05/13/2022)  Transportation Needs: No Transportation Needs (05/13/2022)  Utilities: Not At Risk (05/13/2022)  Depression (PHQ2-9): Low Risk  (04/07/2022)  Financial Resource Strain: Low Risk  (05/31/2021)  Physical Activity: Inactive (05/31/2021)  Stress: No Stress Concern Present (05/31/2021)  Tobacco Use: Low Risk  (05/12/2022)    Readmission Risk Interventions     No data to display

## 2022-05-16 NOTE — Progress Notes (Addendum)
PROGRESS NOTE   HPI was taken from Dr. Clyde Lundborg: Jamie [name] is a 67 y.o. female with medical history significant of HTN, HLD, DM, CKD-3a, OSA on CPAP, morbid obesity, anemia, gallstone, kidney stone, ulcerative colitis (s/p of colectomy and s/p ileostomy), who presents with fall and lower back pain.    Pt states that she accidentally fell in church yesterday evening, landing on her back, and possibly hit her head. She immediately developed middle back pain, which is constant, sharp, severe, 10 out of 10 in severity, nonradiating, aggravated by movement.  No loss of consciousness.  She has chronic peripheral neuropathy, no leg weakness.  Denies loss control for bladder or bowel movement.  Patient had some shortness of breath earlier, which has resolved.  Currently no chest pain, shortness breath, cough.  No fever or chills.  No nausea, vomiting, diarrhea or abdominal pain.  No symptoms of UTI.   Data reviewed independently and ED Course: pt was found to have WBC 13.5, worsening renal function with creatinine 1.82, BUN 23, GFR 30 (recent baseline creatinine 1.38 on 01/31/2022), temperature normal, blood pressure 148/47, heart rate 100, RR 14, oxygen saturation 97% on room air.  CT of head and neck negative for acute injury.  Patient is admitted to telemetry bed as inpatient.  Dr. Marcell Barlow for neurosurgery is consulted.   MRI-L spin 1. Acute nondisplaced transverse fracture through the superior aspect of the L1 vertebral body and posterior elements. 2. Multilevel lumbar disc and facet degeneration, most notable at L4-5 where there is moderate spinal stenosis.   MRI-T spin: Minimal edema type signal along the C7 and T8 superior endplates without a definite acute fracture when correlating with CT.   CT of T- and L spin: 1. Osteopenia and degenerative change thoracic spine without evidence of thoracic spine fractures or malalignment. 2. Cardiomegaly with aortic and coronary artery  atherosclerosis. 3. Acute superior endplate anterior wedge compression fracture of L1 with 25% loss of anterior height but no posterior cortical retropulsion or paraspinal hematoma. 4. Transverse nondisplaced fracture through the superior L1 body extends through both pedicles and dorsally through the superior articular facet of L1 and on the right also through the inferior articular facet of T12, with ankylosis of the facet joints seen previously. The posterior fractures are also nondisplaced. 5. Osteopenia and degenerative change without further appreciable lumbar spine fractures. 6. Nonobstructive left nephrolithiasis. 7. Cluster of extruded gallstones in Morison's pouch. Status post cholecystectomy.   As per Dr. Mayford Knife 4/19-4/23/24: Pt was found to have a compression fracture of L1 vertebra and is s/p open reduction internal fixation of T12 & L1 fractures & T11-L3 posterior arthrodesis as per neuro surg. PT/OT evaluated the pt and recs SNF. Pt has a bed at [facility] but pt's Cr was trending up daily so nephro was consulted today. Baseline Cr 1.32 as per nephro.    Jamie [name]  NFA:[id number] DOB: [birth year] DOA: 05/10/2022 PCP: Judy Pimple, MD   Assessment & Plan:   Principal Problem:   Compression fracture of L1 lumbar vertebra Active Problems:   Fall at home, initial encounter   Essential hypertension   Ulcerative colitis   DM (diabetes mellitus), type 2 with renal complications   Hyperlipidemia associated with type 2 diabetes mellitus   Chronic kidney disease, stage 3a   Leukocytosis   OSA (obstructive sleep apnea)   Morbid obesity   Closed tricolumnar fracture of lumbar vertebra  Assessment and Plan:  Compression fracture of L1 lumbar vertebra: s/p open  reduction internal fixation of T12 & L1 fractures & T11-L3 posterior arthrodesis as per neuro surg. Continue w/ lidocaine patch. Percocet, morphine prn for pain. Continue Brace while OOB for 6 weeks. Neuro surg recs  apprec & signed off. Will need outpatient f/u w/ neuro surg, Dr. Myer Haff. Therapy recs SNF.   Bladder spasms: mirabegon prn. Much improved  Fall: at home. Fall precautions    HTN: continue on metoprolol. Holding losartan secondary to AKI on CKD   Ulcerative colitis: s/p of colectomy and ileostomy. No acute issues    DM2: HbA1c 6.4, well controlled. Continue on SSI w/ accuchecks   HLD: continue on statin    AKI on CKDIIIa: Cr is trending up again today. Decreased urine output. Holding losartan. Continue on IVFs. Nephro consulted and recs apprec   Leukocytosis: likely reactive. Labile    OSA: continue on BiPAP    Morbid obesity: BMI 43.4. Complicates overall care & prognosis         DVT prophylaxis: SCDs, heparin SQ Code Status: full  Family Communication: discussed pt's care w/ pt's family at bedside and answered his questions  Disposition Plan: therapy recs SNF   Level of care: Med-Surg  Status is: Inpatient Remains inpatient appropriate because: severity of illness   Consultants:  Neuro surg   Procedures:   Antimicrobials:  Subjective: Pt c/o intermittent abd pain after Sq heparin is given   Objective: Vitals:   05/15/22 0735 05/15/22 1424 05/16/22 0009 05/16/22 0751  BP: (!) 175/57 (!) 149/53 (!) 156/55 (!) 160/70  Pulse:  74 84 86  Resp:  16 18 18   Temp:  98.1 F (36.7 C) 98.4 F (36.9 C) 98.1 F (36.7 C)  TempSrc:    Oral  SpO2:  100% 96% 98%  Weight:      Height:        Intake/Output Summary (Last 24 hours) at 05/16/2022 0804 Last data filed at 05/16/2022 0630 Gross per 24 hour  Intake 2162.36 ml  Output --  Net 2162.36 ml   Filed Weights   05/11/22 1628 05/12/22 0701  Weight: 120.2 kg 120.2 kg    Examination:  General exam: Appears uncomfortable   Respiratory system: clear breath sounds b/l. No rales  Cardiovascular system: S1 & S2+. No rubs or clicks  Gastrointestinal system: Abd is soft, NT, obese & normal bowel sounds  Central  nervous system: Alert and oriented. Moves all extremities  Psychiatry: judgement and insight appears normal. Flat mood and affect     Data Reviewed: I have personally reviewed following labs and imaging studies  CBC: Recent Labs  Lab 05/11/22 1144 05/13/22 0500 05/14/22 0458 05/15/22 0416 05/16/22 0638  WBC 13.5* 15.7* 13.1* 15.1* 16.2*  HGB 13.8 12.4 12.3 12.7 12.2  HCT 42.2 37.6 37.0 38.5 37.0  MCV 97.5 98.4 96.9 96.7 95.6  PLT 223 177 200 246 244   Basic Metabolic Panel: Recent Labs  Lab 05/12/22 0424 05/13/22 0500 05/14/22 0458 05/15/22 0416 05/16/22 0638  NA 135 137 131* 131* 129*  K 4.0 4.4 4.2 4.4 4.5  CL 104 106 103 101 99  CO2 21* 20* 20* 19* 17*  GLUCOSE 161* 174* 235* 229* 229*  BUN 21 16 28* 46* 67*  CREATININE 1.43* 1.22* 1.67* 3.29* 5.43*  CALCIUM 8.9 8.4* 8.6* 8.6* 8.2*   GFR: Estimated Creatinine Clearance: 13.2 mL/min (A) (by C-G formula based on SCr of 5.43 mg/dL (H)). Liver Function Tests: No results for input(s): "AST", "ALT", "ALKPHOS", "BILITOT", "PROT", "ALBUMIN" in the  last 168 hours. No results for input(s): "LIPASE", "AMYLASE" in the last 168 hours. No results for input(s): "AMMONIA" in the last 168 hours. Coagulation Profile: Recent Labs  Lab 05/11/22 1745  INR 1.1   Cardiac Enzymes: No results for input(s): "CKTOTAL", "CKMB", "CKMBINDEX", "TROPONINI" in the last 168 hours. BNP (last 3 results) No results for input(s): "PROBNP" in the last 8760 hours. HbA1C: No results for input(s): "HGBA1C" in the last 72 hours. CBG: Recent Labs  Lab 05/15/22 0749 05/15/22 1131 05/15/22 1650 05/15/22 2121 05/16/22 0733  GLUCAP 221* 283* 214* 207* 208*   Lipid Profile: No results for input(s): "CHOL", "HDL", "LDLCALC", "TRIG", "CHOLHDL", "LDLDIRECT" in the last 72 hours. Thyroid Function Tests: No results for input(s): "TSH", "T4TOTAL", "FREET4", "T3FREE", "THYROIDAB" in the last 72 hours. Anemia Panel: No results for input(s):  "VITAMINB12", "FOLATE", "FERRITIN", "TIBC", "IRON", "RETICCTPCT" in the last 72 hours. Sepsis Labs: No results for input(s): "PROCALCITON", "LATICACIDVEN" in the last 168 hours.  No results found for this or any previous visit (from the past 240 hour(s)).       Radiology Studies: DG THORACOLUMABAR SPINE  Result Date: 05/14/2022 CLINICAL DATA:  [id number] Postop check [id number] EXAM: THORACOLUMBAR SPINE 1V COMPARISON:  05/10/2022 FINDINGS: Bilateral pedicle screws T11, T12, L2, L3 with vertical interconnecting rods, intact without surrounding lucency. Mild compression deformity L1 vertebral body as before. Alignment is preserved. Aortic Atherosclerosis (ICD10-170.0). Surgical clips right abdomen. Fluid levels in nondilated bowel. IMPRESSION: 1. T11-L3 fusion without apparent complication. 2. Stable L1 compression deformity. Electronically Signed   By: Corlis Leak M.D.   On: 05/14/2022 09:38        Scheduled Meds:  atorvastatin  5 mg Oral QODAY   cholecalciferol  1,000 Units Oral Daily   fluticasone  2 spray Each Nare QHS   heparin  5,000 Units Subcutaneous Q8H   insulin aspart  0-5 Units Subcutaneous QHS   insulin aspart  0-9 Units Subcutaneous TID WC   lidocaine  1 patch Transdermal Q24H   metoprolol tartrate  25 mg Oral Daily   multivitamin with minerals  1 tablet Oral Daily   sodium chloride flush  3 mL Intravenous Q12H   Continuous Infusions:  sodium chloride     sodium chloride 75 mL/hr at 05/15/22 2128     LOS: 5 days    Time spent: 25 mins     Charise Killian, MD [facility] Pager [phone]  If 7PM-7AM, please contact night-coverage [email or web address] 05/16/2022, 8:04 AM

## 2022-05-16 NOTE — Progress Notes (Signed)
Physical Therapy Treatment Patient Details Name: Jamie [name] MRN: [id number] DOB: Jun 21, 1955 Today's Date: 05/16/2022   History of Present Illness Pt is a 67 y/o F admitted on 05/10/22 after presenting with c/o a fall resulting in lower back pain. Pt found to have compression fx of L1 lumbar vertebra. Neurosurgery was consulted & pt underwent posterior thoracic fusion T11-L3 on 05/12/22. PMH: HTN, HLD, DM, CKD3a, OSA on CPAP, morbid obesity, anemia, gallstone, kidney stone, ulcerative colitis s/p colectomy & ileostomy, peripheral neuropathy    PT Comments    Pt in with OT and brief co-tx to allow for safe gait  she stands and is able to walk to sink and back for ADL task at sink with OT.  Overall does well with gait but remains with heavy reliance on walker for gait but no buckling or LOB.  May benefit from wheelchair follow for longer gait distances but did well in room.  She is needed by Ultrasound for testing so she is returned to bed by OT after session.   Recommendations for follow up therapy are one component of a multi-disciplinary discharge planning process, led by the attending physician.  Recommendations may be updated based on patient status, additional functional criteria and insurance authorization.  Follow Up Recommendations  Can patient physically be transported by private vehicle: No    Assistance Recommended at Discharge Frequent or constant Supervision/Assistance  Patient can return home with the following Two people to help with walking and/or transfers;Two people to help with bathing/dressing/bathroom;Help with stairs or ramp for entrance;Assist for transportation;Assistance with cooking/housework   Equipment Recommendations       Recommendations for Other Services       Precautions / Restrictions Precautions Precautions: Fall;Back Required Braces or Orthoses: Spinal Brace Cervical Brace: For comfort Spinal Brace: Thoracolumbosacral orthotic;Applied in sitting  position (pt declined) Restrictions Weight Bearing Restrictions: No     Mobility  Bed Mobility Overal bed mobility: Needs Assistance Bed Mobility: Rolling, Sidelying to Sit Rolling: Min guard Sidelying to sit: Min assist         Patient Response: Cooperative  Transfers Overall transfer level: Needs assistance Equipment used: Rolling walker (2 wheels) Transfers: Sit to/from Stand Sit to Stand: Min assist   Step pivot transfers: Min assist       General transfer comment: Education re: hand placement during STS from EOB    Ambulation/Gait Ambulation/Gait assistance: Min Chemical engineer (Feet): 20 Feet Assistive device: Rolling walker (2 wheels) Gait Pattern/deviations: Step-through pattern, Decreased step length - right, Decreased step length - left Gait velocity: decreased     General Gait Details: good steps to chair   Stairs             Wheelchair Mobility    Modified Rankin (Stroke Patients Only)       Balance Overall balance assessment: Needs assistance Sitting-balance support: Feet supported, Bilateral upper extremity supported Sitting balance-Leahy Scale: Fair     Standing balance support: Bilateral upper extremity supported, During functional activity, Reliant on assistive device for balance, Single extremity supported Standing balance-Leahy Scale: Fair Standing balance comment: heavy reliance on walker for support, able to let go for alternating one-handed LB dressing over hips                            Cognition Arousal/Alertness: Awake/alert Behavior During Therapy: WFL for tasks assessed/performed Overall Cognitive Status: Within Functional Limits for tasks assessed  Exercises Other Exercises Other Exercises: seated AROM BLE EOB    General Comments        Pertinent Vitals/Pain Pain Assessment Pain Assessment: Faces Faces Pain Scale: Hurts little  more Pain Location: back Pain Descriptors / Indicators: Aching Pain Intervention(s): Limited activity within patient's tolerance, Monitored during session, Premedicated before session, Repositioned    Home Living                          Prior Function            PT Goals (current goals can now be found in the care plan section) Progress towards PT goals: Progressing toward goals    Frequency    7X/week      PT Plan Current plan remains appropriate    Co-evaluation PT/OT/SLP Co-Evaluation/Treatment: Yes Reason for Co-Treatment: For patient/therapist safety PT goals addressed during session: Mobility/safety with mobility OT goals addressed during session: ADL's and self-care      AM-PAC PT "6 Clicks" Mobility   Outcome Measure  Help needed turning from your back to your side while in a flat bed without using bedrails?: A Lot Help needed moving from lying on your back to sitting on the side of a flat bed without using bedrails?: A Lot Help needed moving to and from a bed to a chair (including a wheelchair)?: A Little Help needed standing up from a chair using your arms (e.g., wheelchair or bedside chair)?: A Little Help needed to walk in hospital room?: A Little Help needed climbing 3-5 steps with a railing? : Total 6 Click Score: 14    End of Session Equipment Utilized During Treatment: Gait belt Activity Tolerance: Patient limited by pain;Patient limited by fatigue Patient left: with family/visitor present;in bed;with call bell/phone within reach Nurse Communication: Mobility status       Time: 1610-9604 PT Time Calculation (min) (ACUTE ONLY): 8 min  Charges:  $Gait Training: 8-22 mins $Therapeutic Activity: 8-22 mins                    Danielle Dess, PTA 05/16/22, 1:27 PM

## 2022-05-16 NOTE — Progress Notes (Signed)
Physical Therapy Treatment Patient Details Name: Jamie [name] MRN: [id number] DOB: Jun 18, 1955 Today's Date: 05/16/2022   History of Present Illness Pt is a 67 y/o F admitted on 05/10/22 after presenting with c/o a fall resulting in lower back pain. Pt found to have compression fx of L1 lumbar vertebra. Neurosurgery was consulted & pt underwent posterior thoracic fusion T11-L3 on 05/12/22. PMH: HTN, HLD, DM, CKD3a, OSA on CPAP, morbid obesity, anemia, gallstone, kidney stone, ulcerative colitis s/p colectomy & ileostomy, peripheral neuropathy    PT Comments    Pt ready for session.  She is able to roll to left today with rail and no assist.  Advances legs off bed and begins to transition to sitting but does need min a x 1 to fully get to edge.  Steady once sitting with increased time to reposition.  Minimal dizziness noted which is relieved with time and seated BLE AROM.  She stand to walker with min a x 1 and is able to step to recliner at bedside with heavy use of RW for support.  She remains standing briefly once turned.  She does state she thinks she could try a few steps but does not want to wait for +2 to be called.  Will try to coordinate with OT later to progress gait.   Recommendations for follow up therapy are one component of a multi-disciplinary discharge planning process, led by the attending physician.  Recommendations may be updated based on patient status, additional functional criteria and insurance authorization.  Follow Up Recommendations  Can patient physically be transported by private vehicle: No    Assistance Recommended at Discharge Frequent or constant Supervision/Assistance  Patient can return home with the following Two people to help with walking and/or transfers;Two people to help with bathing/dressing/bathroom;Help with stairs or ramp for entrance;Assist for transportation;Assistance with cooking/housework   Equipment Recommendations       Recommendations for  Other Services       Precautions / Restrictions Precautions Precautions: Fall;Back Required Braces or Orthoses: Spinal Brace Cervical Brace: For comfort Spinal Brace: Thoracolumbosacral orthotic;Applied in sitting position Restrictions Weight Bearing Restrictions: No     Mobility  Bed Mobility Overal bed mobility: Needs Assistance Bed Mobility: Rolling, Sidelying to Sit Rolling: Min guard Sidelying to sit: Min assist            Transfers Overall transfer level: Needs assistance Equipment used: Rolling walker (2 wheels) Transfers: Sit to/from Stand     Step pivot transfers: Min assist       General transfer comment: Education re: hand placement during STS from EOB    Ambulation/Gait Ambulation/Gait assistance: Min Chemical engineer (Feet): 3 Feet Assistive device: Rolling walker (2 wheels) Gait Pattern/deviations: Step-to pattern Gait velocity: decreased     General Gait Details: good steps to chair   Stairs             Wheelchair Mobility    Modified Rankin (Stroke Patients Only)       Balance Overall balance assessment: Needs assistance Sitting-balance support: Feet supported, Bilateral upper extremity supported Sitting balance-Leahy Scale: Fair     Standing balance support: Bilateral upper extremity supported, During functional activity, Reliant on assistive device for balance Standing balance-Leahy Scale: Poor Standing balance comment: heavy reliance on walker for support                            Cognition Arousal/Alertness: Awake/alert Behavior During Therapy: [facility] for tasks  assessed/performed Overall Cognitive Status: Within Functional Limits for tasks assessed                                          Exercises Other Exercises Other Exercises: seated AROM BLE EOB    General Comments        Pertinent Vitals/Pain Pain Assessment Pain Assessment: Faces Faces Pain Scale: Hurts little  more Pain Descriptors / Indicators: Grimacing, Guarding, Discomfort Pain Intervention(s): Limited activity within patient's tolerance, Monitored during session, Repositioned    Home Living                          Prior Function            PT Goals (current goals can now be found in the care plan section) Progress towards PT goals: Progressing toward goals    Frequency    7X/week      PT Plan Current plan remains appropriate    Co-evaluation              AM-PAC PT "6 Clicks" Mobility   Outcome Measure  Help needed turning from your back to your side while in a flat bed without using bedrails?: A Lot Help needed moving from lying on your back to sitting on the side of a flat bed without using bedrails?: A Lot Help needed moving to and from a bed to a chair (including a wheelchair)?: A Little Help needed standing up from a chair using your arms (e.g., wheelchair or bedside chair)?: A Little Help needed to walk in hospital room?: A Lot Help needed climbing 3-5 steps with a railing? : Total 6 Click Score: 13    End of Session Equipment Utilized During Treatment: Gait belt Activity Tolerance: Patient limited by pain;Patient limited by fatigue Patient left: with family/visitor present;in bed;with call bell/phone within reach Nurse Communication: Mobility status       Time: 1610-9604 PT Time Calculation (min) (ACUTE ONLY): 14 min  Charges:  $Therapeutic Activity: 8-22 mins                   Danielle Dess, PTA 05/16/22, 11:16 AM

## 2022-05-16 NOTE — Progress Notes (Signed)
Occupational Therapy Treatment Patient Details Name: Jamie [name] MRN: [id number] DOB: Jan 11, 1956 Today's Date: 05/16/2022   History of present illness Pt is a 67 y/o F admitted on 05/10/22 after presenting with c/o a fall resulting in lower back pain. Pt found to have compression fx of L1 lumbar vertebra. Neurosurgery was consulted & pt underwent posterior thoracic fusion T11-L3 on 05/12/22. PMH: HTN, HLD, DM, CKD3a, OSA on CPAP, morbid obesity, anemia, gallstone, kidney stone, ulcerative colitis s/p colectomy & ileostomy, peripheral neuropathy   OT comments  Pt seen for OT and PT cotx to optimize ADL mobility. Pt required MIN A +2 for initial STS with VC for hand placement with RW improving to CGA-MIN A +1 and +2 for safety with additional STS. Pt ambulated with CGA x1-2 with RW to sink in room. Pt completed grooming tasks in standing at sink with intermittent UE support on counter for stability. CGA in standing. MOD A for donning underwear to thread over feet and complete donning over hips. Pt educated in alternating one-handed technique to support improved balance with UE support on RW. Pt progressing towards goals today. Continues to benefit. Recommendation remains appropriate.    Recommendations for follow up therapy are one component of a multi-disciplinary discharge planning process, led by the attending physician.  Recommendations may be updated based on patient status, additional functional criteria and insurance authorization.    Assistance Recommended at Discharge Frequent or constant Supervision/Assistance  Patient can return home with the following  A little help with walking and/or transfers;A lot of help with bathing/dressing/bathroom;Assistance with cooking/housework;Help with stairs or ramp for entrance;Assist for transportation   Equipment Recommendations  BSC/3in1;Hospital bed    Recommendations for Other Services      Precautions / Restrictions Precautions Precautions:  Fall;Back Required Braces or Orthoses: Spinal Brace Cervical Brace: For comfort Spinal Brace: Thoracolumbosacral orthotic;Applied in sitting position (pt declined) Restrictions Weight Bearing Restrictions: No       Mobility Bed Mobility               General bed mobility comments: NT in recliner    Transfers Overall transfer level: Needs assistance Equipment used: Rolling walker (2 wheels) Transfers: Sit to/from Stand Sit to Stand: From elevated surface, Min assist, Min guard, +2 physical assistance           General transfer comment: initial +2 CGA-MIN A for STS from recliner 1st transfer, improving with 2nd transfer to +1     Balance Overall balance assessment: Needs assistance Sitting-balance support: Feet supported, Bilateral upper extremity supported Sitting balance-Leahy Scale: Fair     Standing balance support: Bilateral upper extremity supported, During functional activity, Reliant on assistive device for balance, Single extremity supported Standing balance-Leahy Scale: Poor Standing balance comment: heavy reliance on walker for support, able to let go for alternating one-handed LB dressing over hips                           ADL either performed or assessed with clinical judgement   ADL                                         General ADL Comments: Pt completed grooming tasks in standing at sink with intermittent UE support on counter for stability. CGA in standing. MOD A for donning underwear to thread over feet and complete donning over  hips. Pt educated in alternating one-handed technique to support improved balance wiht UE support on RW.    Extremity/Trunk Assessment              Vision       Perception     Praxis      Cognition Arousal/Alertness: Awake/alert Behavior During Therapy: WFL for tasks assessed/performed Overall Cognitive Status: Within Functional Limits for tasks assessed                                  General Comments: mildly anxious about moving        Exercises Other Exercises Other Exercises: Pt educated in LB ADL techniques, log roll cues    Shoulder Instructions       General Comments      Pertinent Vitals/ Pain       Pain Assessment Pain Assessment: 0-10 Pain Score: 2  Pain Location: back Pain Descriptors / Indicators: Aching Pain Intervention(s): Limited activity within patient's tolerance, Monitored during session, Premedicated before session, Repositioned  Home Living                                          Prior Functioning/Environment              Frequency  Min 3X/week        Progress Toward Goals  OT Goals(current goals can now be found in the care plan section)  Progress towards OT goals: Progressing toward goals  Acute Rehab OT Goals Patient Stated Goal: to improve pain OT Goal Formulation: With patient Time For Goal Achievement: 05/27/22 Potential to Achieve Goals: Good  Plan Discharge plan remains appropriate;Frequency remains appropriate    Co-evaluation    PT/OT/SLP Co-Evaluation/Treatment: Yes Reason for Co-Treatment: For patient/therapist safety;To address functional/ADL transfers PT goals addressed during session: Mobility/safety with mobility;Proper use of DME;Balance OT goals addressed during session: ADL's and self-care;Proper use of Adaptive equipment and DME      AM-PAC OT "6 Clicks" Daily Activity     Outcome Measure   Help from another person eating meals?: None Help from another person taking care of personal grooming?: A Little Help from another person toileting, which includes using toliet, bedpan, or urinal?: A Lot Help from another person bathing (including washing, rinsing, drying)?: A Lot Help from another person to put on and taking off regular upper body clothing?: A Little Help from another person to put on and taking off regular lower body clothing?: A Lot 6  Click Score: 16    End of Session Equipment Utilized During Treatment: Gait belt;Rolling walker (2 wheels)  OT Visit Diagnosis: Other abnormalities of gait and mobility (R26.89);Muscle weakness (generalized) (M62.81)   Activity Tolerance Patient tolerated treatment well   Patient Left in bed;with call bell/phone within reach;with family/visitor present (in bed for transport for ultrasound)   Nurse Communication Mobility status        Time: 1610-9604 OT Time Calculation (min): 26 min  Charges: OT General Charges $OT Visit: 1 Visit OT Treatments $Self Care/Home Management : 8-22 mins  Arman Filter., MPH, MS, OTR/L ascom [phone] 05/16/22, 11:56 AM

## 2022-05-16 NOTE — Care Management Important Message (Signed)
Important Message  Patient Details  Name: Jamie [name] MRN: [id number] Date of Birth: 10/16/1955   Medicare Important Message Given:  Yes     Olegario Messier A Itsel Opfer 05/16/2022, 3:09 PM

## 2022-05-16 NOTE — Inpatient Diabetes Management (Signed)
Inpatient Diabetes Program Recommendations  AACE/ADA: New Consensus Statement on Inpatient Glycemic Control (2015)  Target Ranges:  Prepandial:   less than 140 mg/dL      Peak postprandial:   less than 180 mg/dL (1-2 hours)      Critically ill patients:  140 - 180 mg/dL    Latest Reference Range & Units 05/15/22 07:49 05/15/22 11:31 05/15/22 16:50 05/15/22 21:21  Glucose-Capillary 70 - 99 mg/dL 161 (H) 096 (H) 045 (H) 207 (H)  (H): Data is abnormally high  Latest Reference Range & Units 05/16/22 07:33  Glucose-Capillary 70 - 99 mg/dL 409 (H)  (H): Data is abnormally high   Admit with: Compression fracture of L1 lumbar vertebra after Fall   History: DM2   Home DM Meds: Glipizide 5 mg daily                              Mounjaro 5 mg Qweek   Current Orders: Novolog Sensitive Correction Scale/ SSI (0-9 units) TID AC + HS       MD- Note home DM meds are on hold.  AM CBGs >200 the last 3 days.   Please consider starting basal insulin for in-hospital CBG control:   Semglee 10 units QHS (~0.1 units/kg)         --Will follow patient during hospitalization--   Ambrose Finland RN, MSN, CDCES Diabetes Coordinator Inpatient Glycemic Control Team Team Pager: [phone] (8a-5p)

## 2022-05-17 DIAGNOSIS — S32010A Wedge compression fracture of first lumbar vertebra, initial encounter for closed fracture: Secondary | ICD-10-CM | POA: Diagnosis not present

## 2022-05-17 LAB — CBC
HCT: 34.2 % — ABNORMAL LOW (ref 36.0–46.0)
Hemoglobin: 11.5 g/dL — ABNORMAL LOW (ref 12.0–15.0)
MCH: 31.9 pg (ref 26.0–34.0)
MCHC: 33.6 g/dL (ref 30.0–36.0)
MCV: 94.7 fL (ref 80.0–100.0)
Platelets: 235 10*3/uL (ref 150–400)
RBC: 3.61 MIL/uL — ABNORMAL LOW (ref 3.87–5.11)
RDW: 13.4 % (ref 11.5–15.5)
WBC: 15.8 10*3/uL — ABNORMAL HIGH (ref 4.0–10.5)
nRBC: 0 % (ref 0.0–0.2)

## 2022-05-17 LAB — GLUCOSE, CAPILLARY
Glucose-Capillary: 215 mg/dL — ABNORMAL HIGH (ref 70–99)
Glucose-Capillary: 295 mg/dL — ABNORMAL HIGH (ref 70–99)
Glucose-Capillary: 229 mg/dL — ABNORMAL HIGH (ref 70–99)
Glucose-Capillary: 236 mg/dL — ABNORMAL HIGH (ref 70–99)

## 2022-05-17 LAB — BASIC METABOLIC PANEL
Anion gap: 12 (ref 5–15)
BUN: 82 mg/dL — ABNORMAL HIGH (ref 8–23)
CO2: 20 mmol/L — ABNORMAL LOW (ref 22–32)
Calcium: 8.1 mg/dL — ABNORMAL LOW (ref 8.9–10.3)
Chloride: 97 mmol/L — ABNORMAL LOW (ref 98–111)
Creatinine, Ser: 6.88 mg/dL — ABNORMAL HIGH (ref 0.44–1.00)
GFR, Estimated: 6 mL/min — ABNORMAL LOW (ref >60)
Glucose, Bld: 254 mg/dL — ABNORMAL HIGH (ref 70–99)
Potassium: 4.1 mmol/L (ref 3.5–5.1)
Sodium: 129 mmol/L — ABNORMAL LOW (ref 135–145)

## 2022-05-17 LAB — HEPATITIS B SURFACE ANTIGEN: Hepatitis B Surface Ag: NONREACTIVE

## 2022-05-17 LAB — HEPATITIS B CORE ANTIBODY, IGM: Hep B C IgM: NONREACTIVE

## 2022-05-17 LAB — HEPATITIS B CORE ANTIBODY, TOTAL: Hep B Core Total Ab: NONREACTIVE

## 2022-05-17 MED ORDER — INSULIN GLARGINE-YFGN 100 UNIT/ML ~~LOC~~ SOLN
10.0000 [IU] | Freq: Every day | SUBCUTANEOUS | Status: DC
  Administered 2022-05-17: 10 [IU] via SUBCUTANEOUS
  Filled 2022-05-17 (×2): qty 0.1

## 2022-05-17 NOTE — Progress Notes (Signed)
Physical Therapy Treatment Patient Details Name: Jamie [name] MRN: [id number] DOB: 10/17/1955 Today's Date: 05/17/2022   History of Present Illness Pt is a 67 y/o F admitted on 05/10/22 after presenting with c/o a fall resulting in lower back pain. Pt found to have compression fx of L1 lumbar vertebra. Neurosurgery was consulted & pt underwent posterior thoracic fusion T11-L3 on 05/12/22. PMH: HTN, HLD, DM, CKD3a, OSA on CPAP, morbid obesity, anemia, gallstone, kidney stone, ulcerative colitis s/p colectomy & ileostomy, peripheral neuropathy    PT Comments    Pt was long sitting in bed upon arrival. She is A and O x 4. Does have some noticeable anxiety about  mobility and OOB activity however pt was agreeable. She required increased time to exit R side of bed. Stood pivot to [facility] and successfully urinated prior to standing and ambulating into hallway. Required extensive assistance with hygiene care after urinating.Overall pt is progressing well. Had TLSO donned throughout all standing activity. Acute PT will continue to follow and progress per current POC. DC recs remain appropriate.   Recommendations for follow up therapy are one component of a multi-disciplinary discharge planning process, led by the attending physician.  Recommendations may be updated based on patient status, additional functional criteria and insurance authorization.      Assistance Recommended at Discharge Frequent or constant Supervision/Assistance  Patient can return home with the following A little help with walking and/or transfers;A little help with bathing/dressing/bathroom;Assistance with cooking/housework;Direct supervision/assist for medications management;Direct supervision/assist for financial management;Assist for transportation;Help with stairs or ramp for entrance   Equipment Recommendations  Other (comment) (Defer to next level of care)       Precautions / Restrictions Precautions Precautions:  Fall;Back Required Braces or Orthoses: Spinal Brace Spinal Brace: Thoracolumbosacral orthotic;Applied in sitting position (when OOB/ cut out for ostomy) Restrictions Weight Bearing Restrictions: No     Mobility  Bed Mobility Overal bed mobility: Needs Assistance Bed Mobility: Rolling, Sidelying to Sit, Supine to Sit Rolling: Min guard Sidelying to sit: Min assist Supine to sit: Min assist  General bed mobility comments: Pt was able to roll R to short sit via log roll technique. Increased time + min assist to fully achieve EOB short sit    Transfers Overall transfer level: Needs assistance Equipment used: Rolling walker (2 wheels) Transfers: Sit to/from Stand Sit to Stand: Min assist    General transfer comment: Pt was able to a stand from EOB/BSC/and recliner with min assist. Vcs for technique and sequencing improvements    Ambulation/Gait Ambulation/Gait assistance: Min assist Gait Distance (Feet): 50 Feet Assistive device: Rolling walker (2 wheels) Gait Pattern/deviations: Step-through pattern, Decreased step length - right, Decreased step length - left Gait velocity: decreased     General Gait Details: Pt was able to ambulated 50 ft with RW with slow cautious gait. Overall tolerated well but was limited by pain/fatigue     Balance Overall balance assessment: Needs assistance Sitting-balance support: Feet supported, Bilateral upper extremity supported Sitting balance-Leahy Scale: Good     Standing balance support: Bilateral upper extremity supported, During functional activity, Reliant on assistive device for balance, Single extremity supported Standing balance-Leahy Scale: Fair Standing balance comment: heavy reliance on walker for support       Cognition Arousal/Alertness: Awake/alert Behavior During Therapy: WFL for tasks assessed/performed Overall Cognitive Status: Within Functional Limits for tasks assessed      General Comments: Pt is A and O x 4.  endorses feeling slightly better but still anxious with  movements/OOB activity               Pertinent Vitals/Pain Pain Assessment Pain Assessment: 0-10 Pain Score: 6  Pain Location: back Pain Descriptors / Indicators: Aching Pain Intervention(s): Limited activity within patient's tolerance, Monitored during session, Premedicated before session, Repositioned     PT Goals (current goals can now be found in the care plan section) Acute Rehab PT Goals Patient Stated Goal: less pain with better  mobility. Progress towards PT goals: Progressing toward goals    Frequency    7X/week      PT Plan Current plan remains appropriate    Co-evaluation     PT goals addressed during session: Mobility/safety with mobility;Balance;Proper use of DME;Strengthening/ROM        AM-PAC PT "6 Clicks" Mobility   Outcome Measure  Help needed turning from your back to your side while in a flat bed without using bedrails?: A Little Help needed moving from lying on your back to sitting on the side of a flat bed without using bedrails?: A Little Help needed moving to and from a bed to a chair (including a wheelchair)?: A Lot Help needed standing up from a chair using your arms (e.g., wheelchair or bedside chair)?: A Lot Help needed to walk in hospital room?: A Little Help needed climbing 3-5 steps with a railing? : A Lot 6 Click Score: 15    End of Session Equipment Utilized During Treatment: Gait belt Activity Tolerance: Patient tolerated treatment well Patient left: in chair;with call bell/phone within reach;with chair alarm set;with family/visitor present Nurse Communication: Mobility status PT Visit Diagnosis: Muscle weakness (generalized) (M62.81);Pain;Difficulty in walking, not elsewhere classified (R26.2);Other abnormalities of gait and mobility (R26.89);Unsteadiness on feet (R26.81)     Time: 1610-9604 PT Time Calculation (min) (ACUTE ONLY): 31 min  Charges:  $Gait Training:  8-22 mins $Therapeutic Activity: 8-22 mins                     Jetta Lout PTA 05/17/22, 3:01 PM

## 2022-05-17 NOTE — Progress Notes (Signed)
[facility]  ROUNDING NOTE   Subjective:   [name] was admitted to [facility] on 05/10/2022 for Compression fracture of L1 lumbar vertebra [S32.010A] Compression fracture of lumbar vertebra, unspecified lumbar vertebral level, initial encounter [S32.000A]  Patient is followed by our practice outpatient by Dr Cherylann Ratel.   Patient resting comfortably in bed Alert and oriented Husband remains at bedside Reports appetite has decreased today Continues to make urine  Creatinine 6.88 (5.43)   Objective:  Vital signs in last 24 hours:  Temp:  [98 F (36.7 C)-98.3 F (36.8 C)] 98.3 F (36.8 C) (04/24 0754) Pulse Rate:  [80-88] 88 (04/24 0754) Resp:  [18-20] 18 (04/24 0754) BP: (145-166)/(32-53) 166/53 (04/24 0754) SpO2:  [97 %-100 %] 97 % (04/24 0754) FiO2 (%):  [21 %] 21 % (04/23 1931)  Weight change:  Filed Weights   05/11/22 1628 05/12/22 0701  Weight: 120.2 kg 120.2 kg    Intake/Output: I/O last 3 completed shifts: In: 2656.7 [P.O.:360; I.V.:2296.7] Out: 425 [Stool:425]   Intake/Output this shift:  No intake/output data recorded.  Physical Exam: General: NAD, resting in bed  Head: Normocephalic, atraumatic. Moist oral mucosal membranes  Eyes: Anicteric  Lungs:  Clear to auscultation  Heart: Regular rate and rhythm  Abdomen:  Soft, nontender,   Extremities:  no peripheral edema.  Neurologic: Nonfocal, moving all four extremities  Skin: No lesions        Basic Metabolic Panel: Recent Labs  Lab 05/13/22 0500 05/14/22 0458 05/15/22 0416 05/16/22 0638 05/17/22 0614  NA 137 131* 131* 129* 129*  K 4.4 4.2 4.4 4.5 4.1  CL 106 103 101 99 97*  CO2 20* 20* 19* 17* 20*  GLUCOSE 174* 235* 229* 229* 254*  BUN 16 28* 46* 67* 82*  CREATININE 1.22* 1.67* 3.29* 5.43* 6.88*  CALCIUM 8.4* 8.6* 8.6* 8.2* 8.1*     Liver Function Tests: No results for input(s): "AST", "ALT", "ALKPHOS", "BILITOT", "PROT", "ALBUMIN" in the last 168 hours. No results  for input(s): "LIPASE", "AMYLASE" in the last 168 hours. No results for input(s): "AMMONIA" in the last 168 hours.  CBC: Recent Labs  Lab 05/13/22 0500 05/14/22 0458 05/15/22 0416 05/16/22 0638 05/17/22 0614  WBC 15.7* 13.1* 15.1* 16.2* 15.8*  HGB 12.4 12.3 12.7 12.2 11.5*  HCT 37.6 37.0 38.5 37.0 34.2*  MCV 98.4 96.9 96.7 95.6 94.7  PLT 177 200 246 244 235     Cardiac Enzymes: No results for input(s): "CKTOTAL", "CKMB", "CKMBINDEX", "TROPONINI" in the last 168 hours.  BNP: Invalid input(s): "POCBNP"  CBG: Recent Labs  Lab 05/16/22 0733 05/16/22 1106 05/16/22 1535 05/16/22 2123 05/17/22 0810  GLUCAP 208* 239* 300* 240* 215*     Microbiology: Results for orders placed or performed during the hospital encounter of 05/30/21  Urine Culture     Status: Abnormal   Collection Time: 05/30/21  5:11 AM   Specimen: Urine, Clean Catch  Result Value Ref Range Status   Specimen Description   Final    URINE, CLEAN CATCH Performed at [facility], [address], [place], [place] [address]    Special Requests   Final    NONE Performed at [facility], [address] [place], [place] [address]    Culture MULTIPLE SPECIES PRESENT, SUGGEST RECOLLECTION (A)  Final   Report Status 05/31/2021 FINAL  Final    Coagulation Studies: No results for input(s): "LABPROT", "INR" in the last 72 hours.  Urinalysis: Recent Labs    05/16/22  1743  COLORURINE YELLOW*  LABSPEC 1.010  PHURINE 5.0  GLUCOSEU NEGATIVE  HGBUR LARGE*  BILIRUBINUR NEGATIVE  KETONESUR NEGATIVE  PROTEINUR NEGATIVE  NITRITE NEGATIVE  LEUKOCYTESUR TRACE*      Imaging: US RENAL  Result Date: 05/16/2022 CLINICAL DATA:  Acute renal failure EXAM: RENAL / URINARY TRACT ULTRASOUND COMPLETE COMPARISON:  CT abdomen pelvis contrast 05/30/2021 FINDINGS: Right Kidney: Renal measurements: 11.4 x 4.7 x 6.3 cm = volume: 176 mL. Echogenicity within normal limits. No mass. Mild hydronephrosis. Left  Kidney: Renal measurements: 11.9 x 6.1 x 6.6 cm = volume: 249 mL. Echogenicity within normal limits. No mass. Mild hydronephrosis. 7 mm nonobstructing calculus of the lower pole. Bladder: Appears normal for degree of bladder distention. Other: None. IMPRESSION: 1. Mild bilateral hydronephrosis. 2. 8 mm nonobstructing left renal calculus. Electronically Signed   By: Acquanetta Belling M.D.   On: 05/16/2022 14:09     Medications:    sodium chloride     sodium bicarbonate 150 mEq in dextrose 5 % 1,150 mL infusion 100 mL/hr at 05/17/22 0325    atorvastatin  5 mg Oral QODAY   cholecalciferol  1,000 Units Oral Daily   fluticasone  2 spray Each Nare QHS   heparin  5,000 Units Subcutaneous Q8H   insulin aspart  0-5 Units Subcutaneous QHS   insulin aspart  0-9 Units Subcutaneous TID WC   lidocaine  1 patch Transdermal Q24H   metoprolol tartrate  25 mg Oral Daily   multivitamin with minerals  1 tablet Oral Daily   sodium chloride flush  3 mL Intravenous Q12H   acetaminophen, albuterol, dextromethorphan-guaiFENesin, hydrALAZINE, melatonin, menthol-cetylpyridinium **OR** phenol, methocarbamol, mirabegron ER, morphine injection, ondansetron (ZOFRAN) IV, oxyCODONE-acetaminophen, simethicone, sodium chloride flush  Assessment/ Plan:  [name] is a 67 y.o. white female with diabetes mellitus type II noninsulin dependent, hypertension, nephrolithiasis, sleep apnea, GERD, hyperlipidemia, who is admitted to [facility] on 05/10/2022 for Compression fracture of L1 lumbar vertebra [S32.010A] Compression fracture of lumbar vertebra, unspecified lumbar vertebral level, initial encounter [S32.000A]  Acute kidney injury with acute metabolic acidosis and hyponatremia on chronic kidney disease stage IIIB with proteinuria secondary to diabetic nephropathy and hypertension. Baseline creatinine of 1.32, GFR of 44 on 04/04/22. No IV contrast exposure. Most likely secondary to her back surgery.  - holding losartan - Renal  ultrasound shows mild bilateral hydronephrosis and 8mm nonobstructing left renal calculus - UA cloudy with some leukocytes  - Continue IV fluids to bicarb with dextrose.  - no acute indication for dialysis.   Hypertension with chronic kidney disease: 132/88. Elevated readings. Patient with pain. Home regimen of losartan and metoprolol. - holding losartan - continue metoprolol  Diabetes mellitus type II with chronic kidney disease: noninsulin dependent. Hemoglobin A1c of 6.4% on 01/31/22. Placed on sliding scale insulin. Not on an SGLT-2 inhibitor as outpatient due to history of acute kidney injury to empagliflozin. Glucose elevated, primary team to manage SSI   LOS: 6   4/24/202410:47 AM

## 2022-05-17 NOTE — Progress Notes (Signed)
  PROGRESS NOTE    Jamie [name]  ZOX:[id number] DOB: 12-Jan-1956 DOA: 05/10/2022 PCP: Judy Pimple, MD  145A/145A-AA  LOS: 6 days   Brief hospital course:   Assessment & Plan: Jamie [name] is a 67 y.o. female with medical history significant of HTN, HLD, DM, CKD-3a, OSA on CPAP, morbid obesity, anemia, gallstone, kidney stone, ulcerative colitis (s/p of colectomy and s/p ileostomy), who presents with fall and lower back pain.    Jamie [name] states that she accidentally fell in church yesterday evening, landing on her back, and possibly hit her head.   Compression fracture of L1 lumbar vertebra:  s/p open reduction internal fixation of T12 & L1 fractures & T11-L3 posterior arthrodesis as per neuro surg.  Continue w/ lidocaine patch. Percocet prn for pain.  Continue Brace while OOB for 6 weeks. --Will need outpatient f/u w/ neuro surg, Dr. Myer Haff.  --Therapy recs SNF.    Bladder spasms: Much improved mirabegon prn.    Fall: at home. Fall precautions    HTN:  continue on metoprolol.  --hold losartan 2/2 AKI   Ulcerative colitis: s/p of colectomy and ileostomy. No acute issues    DM2: HbA1c 6.4, well controlled.  Continue on SSI w/ accuchecks  --start glargine 10u nightly   HLD:  continue on statin    AKI on CKDIIIa:  Acute metabolic acidosis Cr is trending up again today. Decreased urine output.  --hold losartan --cont MIVF as Na bicarb in D5  Leukocytosis: likely reactive. Labile    OSA:  continue on BiPAP    Morbid obesity: BMI 43.4. Complicates overall care & prognosis     DVT prophylaxis: Heparin SQ Code Status: Full code  Family Communication: husband updated at bedside today Level of care: Med-Surg Dispo:   The patient is from: home Anticipated d/c is to: home Anticipated d/c date is: undetermined   Subjective and Interval History:  Jamie [name] reported back pain improved.  Having BM's.   Objective: Vitals:   05/16/22 1533 05/17/22 0050 05/17/22 0754  05/17/22 1614  BP: (!) 145/32 (!) 161/48 (!) 166/53 (!) 135/48  Pulse: 80 88 88 77  Resp: Temp: 98 F (36.7 C) 98.1 F (36.7 C) 98.3 F (36.8 C) 98.2 F (36.8 C)  TempSrc:      SpO2: 100% 97% 97% 98%  Weight:      Height:        Intake/Output Summary (Last 24 hours) at 05/17/2022 1937 Last data filed at 05/17/2022 1415 Gross per 24 hour  Intake 1232.67 ml  Output 100 ml  Net 1132.67 ml   Filed Weights   05/11/22 1628 05/12/22 0701  Weight: 120.2 kg 120.2 kg    Examination:   Constitutional: NAD, AAOx3 HEENT: conjunctivae and lids normal, EOMI CV: No cyanosis.   RESP: normal respiratory effort, on RA Neuro: II - XII grossly intact.   Psych: Normal mood and affect.  Appropriate judgement and reason   Data Reviewed: I have personally reviewed labs and imaging studies  Time spent: 35 minutes  Darlin Priestly, MD [facility] If 7PM-7AM, please contact night-coverage 05/17/2022, 7:37 PM

## 2022-05-17 NOTE — Plan of Care (Signed)
  Problem: Education: Goal: Ability to describe self-care measures that may prevent or decrease complications (Diabetes Survival Skills Education) will improve Outcome: Progressing Goal: Individualized Educational Video(s) Outcome: Progressing   Problem: Coping: Goal: Ability to adjust to condition or change in health will improve Outcome: Progressing   Problem: Fluid Volume: Goal: Ability to maintain a balanced intake and output will improve Outcome: Progressing   Problem: Health Behavior/Discharge Planning: Goal: Ability to identify and utilize available resources and services will improve Outcome: Progressing Goal: Ability to manage health-related needs will improve Outcome: Progressing   Problem: Metabolic: Goal: Ability to maintain appropriate glucose levels will improve Outcome: Progressing   Problem: Nutritional: Goal: Maintenance of adequate nutrition will improve Outcome: Progressing Goal: Progress toward achieving an optimal weight will improve Outcome: Progressing   Problem: Skin Integrity: Goal: Risk for impaired skin integrity will decrease Outcome: Progressing   Problem: Tissue Perfusion: Goal: Adequacy of tissue perfusion will improve Outcome: Progressing   Problem: Education: Goal: Knowledge of General Education information will improve Description: Including pain rating scale, medication(s)/side effects and non-pharmacologic comfort measures Outcome: Progressing   Problem: Health Behavior/Discharge Planning: Goal: Ability to manage health-related needs will improve Outcome: Progressing   Problem: Clinical Measurements: Goal: Ability to maintain clinical measurements within normal limits will improve Outcome: Progressing Goal: Will remain free from infection Outcome: Progressing Goal: Diagnostic test results will improve Outcome: Progressing Goal: Respiratory complications will improve Outcome: Progressing Goal: Cardiovascular complication will  be avoided Outcome: Progressing   Problem: Activity: Goal: Risk for activity intolerance will decrease Outcome: Progressing   Problem: Nutrition: Goal: Adequate nutrition will be maintained Outcome: Progressing   Problem: Coping: Goal: Level of anxiety will decrease Outcome: Progressing   Problem: Elimination: Goal: Will not experience complications related to bowel motility Outcome: Progressing Goal: Will not experience complications related to urinary retention Outcome: Progressing   Problem: Pain Managment: Goal: General experience of comfort will improve Outcome: Progressing   Problem: Safety: Goal: Ability to remain free from injury will improve Outcome: Progressing   Problem: Skin Integrity: Goal: Risk for impaired skin integrity will decrease Outcome: Progressing   Problem: Education: Goal: Ability to verbalize activity precautions or restrictions will improve Outcome: Progressing Goal: Knowledge of the prescribed therapeutic regimen will improve Outcome: Progressing Goal: Understanding of discharge needs will improve Outcome: Progressing   Problem: Activity: Goal: Ability to avoid complications of mobility impairment will improve Outcome: Progressing Goal: Ability to tolerate increased activity will improve Outcome: Progressing Goal: Will remain free from falls Outcome: Progressing   Problem: Bowel/Gastric: Goal: Gastrointestinal status for postoperative course will improve Outcome: Progressing   Problem: Clinical Measurements: Goal: Ability to maintain clinical measurements within normal limits will improve Outcome: Progressing Goal: Postoperative complications will be avoided or minimized Outcome: Progressing Goal: Diagnostic test results will improve Outcome: Progressing   Problem: Pain Management: Goal: Pain level will decrease Outcome: Progressing   Problem: Skin Integrity: Goal: Will show signs of wound healing Outcome: Progressing    Problem: Health Behavior/Discharge Planning: Goal: Identification of resources available to assist in meeting health care needs will improve Outcome: Progressing   Problem: Bladder/Genitourinary: Goal: Urinary functional status for postoperative course will improve Outcome: Progressing   

## 2022-05-17 NOTE — Inpatient Diabetes Management (Signed)
Inpatient Diabetes Program Recommendations  AACE/ADA: New Consensus Statement on Inpatient Glycemic Control   Target Ranges:  Prepandial:   less than 140 mg/dL      Peak postprandial:   less than 180 mg/dL (1-2 hours)      Critically ill patients:  140 - 180 mg/dL    Latest Reference Range & Units 05/16/22 07:33 05/16/22 11:06 05/16/22 15:35 05/16/22 21:23 05/17/22 08:10 05/17/22 11:48  Glucose-Capillary 70 - 99 mg/dL 161 (H) 096 (H) 045 (H) 240 (H) 215 (H) 295 (H)    Review of Glycemic Control  Diabetes history: DM2 Outpatient Diabetes medications: Glipizide 5 mg daily, Mounjaro 5 mg Qweek Current orders for Inpatient glycemic control: Novolog 0-9 units TID with meals, Novolog 0-5 units QHS  Inpatient Diabetes Program Recommendations:    Insulin: CBGs 208-300 mg/dl on 05/02/79 and 191-478 mg/dl today.  Please consider ordering Semglee 10 units Q24H.  Thanks, Orlando Penner, RN, MSN, CDCES Diabetes Coordinator Inpatient Diabetes Program [phone] (Team Pager from 8am to 5pm)

## 2022-05-18 DIAGNOSIS — S32010A Wedge compression fracture of first lumbar vertebra, initial encounter for closed fracture: Secondary | ICD-10-CM | POA: Diagnosis not present

## 2022-05-18 LAB — GLUCOSE, CAPILLARY
Glucose-Capillary: 234 mg/dL — ABNORMAL HIGH (ref 70–99)
Glucose-Capillary: 283 mg/dL — ABNORMAL HIGH (ref 70–99)
Glucose-Capillary: 255 mg/dL — ABNORMAL HIGH (ref 70–99)
Glucose-Capillary: 254 mg/dL — ABNORMAL HIGH (ref 70–99)

## 2022-05-18 LAB — URINALYSIS, W/ REFLEX TO CULTURE (INFECTION SUSPECTED)
Bilirubin Urine: NEGATIVE
Glucose, UA: NEGATIVE mg/dL
Ketones, ur: NEGATIVE mg/dL
Nitrite: NEGATIVE
Protein, ur: NEGATIVE mg/dL
Specific Gravity, Urine: 1.01 (ref 1.005–1.030)
pH: 5 (ref 5.0–8.0)

## 2022-05-18 LAB — BASIC METABOLIC PANEL
Anion gap: 13 (ref 5–15)
BUN: 89 mg/dL — ABNORMAL HIGH (ref 8–23)
CO2: 24 mmol/L (ref 22–32)
Calcium: 7.7 mg/dL — ABNORMAL LOW (ref 8.9–10.3)
Chloride: 90 mmol/L — ABNORMAL LOW (ref 98–111)
Creatinine, Ser: 7.52 mg/dL — ABNORMAL HIGH (ref 0.44–1.00)
GFR, Estimated: 5 mL/min — ABNORMAL LOW (ref >60)
Glucose, Bld: 259 mg/dL — ABNORMAL HIGH (ref 70–99)
Potassium: 3.5 mmol/L (ref 3.5–5.1)
Sodium: 127 mmol/L — ABNORMAL LOW (ref 135–145)

## 2022-05-18 LAB — CBC
HCT: 31.4 % — ABNORMAL LOW (ref 36.0–46.0)
Hemoglobin: 10.7 g/dL — ABNORMAL LOW (ref 12.0–15.0)
MCH: 31.8 pg (ref 26.0–34.0)
MCHC: 34.1 g/dL (ref 30.0–36.0)
MCV: 93.5 fL (ref 80.0–100.0)
Platelets: 228 10*3/uL (ref 150–400)
RBC: 3.36 MIL/uL — ABNORMAL LOW (ref 3.87–5.11)
RDW: 13.2 % (ref 11.5–15.5)
WBC: 14.4 10*3/uL — ABNORMAL HIGH (ref 4.0–10.5)
nRBC: 0 % (ref 0.0–0.2)

## 2022-05-18 LAB — MAGNESIUM: Magnesium: 2.2 mg/dL (ref 1.7–2.4)

## 2022-05-18 MED ORDER — LORAZEPAM 2 MG/ML IJ SOLN
1.0000 mg | Freq: Once | INTRAMUSCULAR | Status: AC
  Administered 2022-05-18: 1 mg via INTRAVENOUS
  Filled 2022-05-18: qty 1

## 2022-05-18 MED ORDER — INSULIN GLARGINE-YFGN 100 UNIT/ML ~~LOC~~ SOLN
15.0000 [IU] | Freq: Every day | SUBCUTANEOUS | Status: DC
  Administered 2022-05-18 – 2022-05-19 (×2): 15 [IU] via SUBCUTANEOUS
  Filled 2022-05-18 (×3): qty 0.15

## 2022-05-18 MED ORDER — METHOCARBAMOL 1000 MG/10ML IJ SOLN
1000.0000 mg | Freq: Once | INTRAVENOUS | Status: AC
  Administered 2022-05-18: 1000 mg via INTRAVENOUS
  Filled 2022-05-18: qty 10

## 2022-05-18 NOTE — Progress Notes (Signed)
[facility]  ROUNDING NOTE   Subjective:   [name] was admitted to [facility] on 05/10/2022 for Compression fracture of L1 lumbar vertebra [S32.010A] Compression fracture of lumbar vertebra, unspecified lumbar vertebral level, initial encounter [S32.000A]  Patient is followed by our practice outpatient by Dr Cherylann Ratel.   Patient laying in bed Souse at bedside States she feels well Appetite has improved today Intermittent nausea  Creatinine 7.52 (6.88) (5.43)   Objective:  Vital signs in last 24 hours:  Temp:  [98.2 F (36.8 C)-98.7 F (37.1 C)] 98.4 F (36.9 C) (04/25 0725) Pulse Rate:  [77-88] 82 (04/25 0725) Resp:  [18-20] 19 (04/25 0725) BP: (135-158)/(41-48) 155/41 (04/25 0725) SpO2:  [97 %-99 %] 97 % (04/25 0725)  Weight change:  Filed Weights   05/11/22 1628 05/12/22 0701  Weight: 120.2 kg 120.2 kg    Intake/Output: I/O last 3 completed shifts: In: 1232.7 [P.O.:360; I.V.:872.7] Out: 275 [Stool:275]   Intake/Output this shift:  Total I/O In: 200 [P.O.:200] Out: -   Physical Exam: General: NAD, resting in bed  Head: Normocephalic, atraumatic. Moist oral mucosal membranes  Eyes: Anicteric  Lungs:  Clear to auscultation  Heart: Regular rate and rhythm  Abdomen:  Soft, nontender  Extremities:  no peripheral edema.  Neurologic: Nonfocal, moving all four extremities  Skin: No lesions        Basic Metabolic Panel: Recent Labs  Lab 05/14/22 0458 05/15/22 0416 05/16/22 0638 05/17/22 0614 05/18/22 0431  NA 131* 131* 129* 129* 127*  K 4.2 4.4 4.5 4.1 3.5  CL 103 101 99 97* 90*  CO2 20* 19* 17* 20* 24  GLUCOSE 235* 229* 229* 254* 259*  BUN 28* 46* 67* 82* 89*  CREATININE 1.67* 3.29* 5.43* 6.88* 7.52*  CALCIUM 8.6* 8.6* 8.2* 8.1* 7.7*  MG  --   --   --   --  2.2     Liver Function Tests: No results for input(s): "AST", "ALT", "ALKPHOS", "BILITOT", "PROT", "ALBUMIN" in the last 168 hours. No results for input(s): "LIPASE",  "AMYLASE" in the last 168 hours. No results for input(s): "AMMONIA" in the last 168 hours.  CBC: Recent Labs  Lab 05/14/22 0458 05/15/22 0416 05/16/22 0638 05/17/22 0614 05/18/22 0431  WBC 13.1* 15.1* 16.2* 15.8* 14.4*  HGB 12.3 12.7 12.2 11.5* 10.7*  HCT 37.0 38.5 37.0 34.2* 31.4*  MCV 96.9 96.7 95.6 94.7 93.5  PLT 200 246 244 235 228     Cardiac Enzymes: No results for input(s): "CKTOTAL", "CKMB", "CKMBINDEX", "TROPONINI" in the last 168 hours.  BNP: Invalid input(s): "POCBNP"  CBG: Recent Labs  Lab 05/17/22 1148 05/17/22 1615 05/17/22 2137 05/18/22 0741 05/18/22 1142  GLUCAP 295* 229* 236* 234* 283*     Microbiology: Results for orders placed or performed during the hospital encounter of 05/30/21  Urine Culture     Status: Abnormal   Collection Time: 05/30/21  5:11 AM   Specimen: Urine, Clean Catch  Result Value Ref Range Status   Specimen Description   Final    URINE, CLEAN CATCH Performed at [facility], [address] [place], [place] [address]    Special Requests   Final    NONE Performed at [facility], [address] [place], [place] [address]    Culture MULTIPLE SPECIES PRESENT, SUGGEST RECOLLECTION (A)  Final   Report Status 05/31/2021 FINAL  Final    Coagulation Studies: No results for input(s): "LABPROT", "INR" in the last 72 hours.  Urinalysis:  Recent Labs    05/16/22 1743  COLORURINE YELLOW*  LABSPEC 1.010  PHURINE 5.0  GLUCOSEU NEGATIVE  HGBUR LARGE*  BILIRUBINUR NEGATIVE  KETONESUR NEGATIVE  PROTEINUR NEGATIVE  NITRITE NEGATIVE  LEUKOCYTESUR TRACE*       Imaging: US RENAL  Result Date: 05/16/2022 CLINICAL DATA:  Acute renal failure EXAM: RENAL / URINARY TRACT ULTRASOUND COMPLETE COMPARISON:  CT abdomen pelvis contrast 05/30/2021 FINDINGS: Right Kidney: Renal measurements: 11.4 x 4.7 x 6.3 cm = volume: 176 mL. Echogenicity within normal limits. No mass. Mild hydronephrosis. Left Kidney: Renal  measurements: 11.9 x 6.1 x 6.6 cm = volume: 249 mL. Echogenicity within normal limits. No mass. Mild hydronephrosis. 7 mm nonobstructing calculus of the lower pole. Bladder: Appears normal for degree of bladder distention. Other: None. IMPRESSION: 1. Mild bilateral hydronephrosis. 2. 8 mm nonobstructing left renal calculus. Electronically Signed   By: Acquanetta Belling M.D.   On: 05/16/2022 14:09     Medications:    sodium chloride     sodium bicarbonate 150 mEq in dextrose 5 % 1,150 mL infusion 100 mL/hr at 05/17/22 1509    atorvastatin  5 mg Oral QODAY   cholecalciferol  1,000 Units Oral Daily   fluticasone  2 spray Each Nare QHS   heparin  5,000 Units Subcutaneous Q8H   insulin aspart  0-5 Units Subcutaneous QHS   insulin aspart  0-9 Units Subcutaneous TID WC   insulin glargine-yfgn  10 Units Subcutaneous QHS   lidocaine  1 patch Transdermal Q24H   metoprolol tartrate  25 mg Oral Daily   multivitamin with minerals  1 tablet Oral Daily   sodium chloride flush  3 mL Intravenous Q12H   acetaminophen, albuterol, dextromethorphan-guaiFENesin, hydrALAZINE, melatonin, menthol-cetylpyridinium **OR** phenol, methocarbamol, mirabegron ER, ondansetron (ZOFRAN) IV, oxyCODONE-acetaminophen, simethicone, sodium chloride flush  Assessment/ Plan:  [name] is a 67 y.o. white female with diabetes mellitus type II noninsulin dependent, hypertension, nephrolithiasis, sleep apnea, GERD, hyperlipidemia, who is admitted to [facility] on 05/10/2022 for Compression fracture of L1 lumbar vertebra [S32.010A] Compression fracture of lumbar vertebra, unspecified lumbar vertebral level, initial encounter [S32.000A]  Acute kidney injury with acute metabolic acidosis and hyponatremia on chronic kidney disease stage IIIB with proteinuria secondary to diabetic nephropathy and hypertension. Baseline creatinine of 1.32, GFR of 44 on 04/04/22. No IV contrast exposure. Most likely secondary to her back surgery.  - holding  losartan - Renal ultrasound shows mild bilateral hydronephrosis and 8mm nonobstructing left renal calculus. Follows Urology outpatient.   - Creatinine continues to worsen, encouraged nursing to document urine output.  - Discussed possible need for temporary dialysis, not acutely needed as patient not experiencing uremic symptoms.  - Continue IVF  Hypertension with chronic kidney disease: 132/88. Elevated readings. Patient with pain. Home regimen of losartan and metoprolol. - holding losartan - continue metoprolol  Diabetes mellitus type II with chronic kidney disease: noninsulin dependent. Hemoglobin A1c of 6.4% on 01/31/22. Placed on sliding scale insulin. Not on an SGLT-2 inhibitor as outpatient due to history of acute kidney injury to empagliflozin.  Glucose remains elevated. Primary team to manage SSI   LOS: 7   4/25/202412:06 PM

## 2022-05-18 NOTE — Progress Notes (Signed)
PT Cancellation Note  Patient Details Name: Jamie [name] MRN: [id number] DOB: May 08, 1955   Cancelled Treatment:    Reason Eval/Treat Not Completed:  (Nursing care with first attempt. Second attempt, patient complains of fatigue and is tearful of her overall medical condition, refusing PT at this time. PT will continue with attempts)  Donna Bernard, PT, MPT  Ina Homes 05/18/2022, 12:47 PM

## 2022-05-18 NOTE — Progress Notes (Signed)
ICU charge RN called to 1A to insert foley catheter in patient. Unsuccessfully attempted to insert foley catheter x2 with assistance from another RN on 1A. Difficulties with visualization of patient's anatomy even with assistance in positioning.

## 2022-05-18 NOTE — Inpatient Diabetes Management (Signed)
Inpatient Diabetes Program Recommendations  AACE/ADA: New Consensus Statement on Inpatient Glycemic Control (2015)  Target Ranges:  Prepandial:   less than 140 mg/dL      Peak postprandial:   less than 180 mg/dL (1-2 hours)      Critically ill patients:  140 - 180 mg/dL   Lab Results  Component Value Date   GLUCAP 234 (H) 05/18/2022   HGBA1C 6.4 01/31/2022    Review of Gl  Latest Reference Range & Units 05/17/22 08:10 05/17/22 11:48 05/17/22 16:15 05/17/22 21:37 05/18/22 07:41  Glucose-Capillary 70 - 99 mg/dL 098 (H) 119 (H) 147 (H) 236 (H)  Semglee 10 units 234 (H)  (H): Data is abnormally high ycemic Control  Diabetes history: DM2 Outpatient Diabetes medications: Glipizide 5 mg daily, Mounjaro 5 mg Qweek Current orders for Inpatient glycemic control: Novolog 0-9 units TID with meals, Novolog 0-5 units QHS, Semglee 10 units QHS  Inpatient Diabetes Program Recommendations:    Please consider increasing Semglee to 14 units QHS.  Will continue to follow while inpatient.  Thank you, Dulce Sellar, MSN, CDCES Diabetes Coordinator Inpatient Diabetes Program [phone] (team pager from 8a-5p)

## 2022-05-18 NOTE — Progress Notes (Signed)
Order was placed to place a foley because of retention. 4 attempts were made with one attempt made by ICU nurse. It was suggested that patient received medication to help her relax. Order was place and will be administered. After administration, another attempt will be made. Oncoming nurse, charge nurse and ICU nurse made aware. Will continue to monitor patient and update with successful/unsuccessful placement.

## 2022-05-18 NOTE — Plan of Care (Signed)

## 2022-05-18 NOTE — Progress Notes (Signed)
  PROGRESS NOTE    Jamie [name]  MWN:[id number] DOB: [birth year] DOA: 05/10/2022 PCP: Judy Pimple, MD  145A/145A-AA  LOS: 7 days   Brief hospital course:   Assessment & Plan: Jamie [name] is a 67 y.o. female with medical history significant of HTN, HLD, DM, CKD-3a, OSA on CPAP, morbid obesity, anemia, gallstone, kidney stone, ulcerative colitis (s/p of colectomy and s/p ileostomy), who presents with fall and lower back pain.    Pt states that she accidentally fell in church yesterday evening, landing on her back, and possibly hit her head.   Compression fracture of L1 lumbar vertebra:  s/p open reduction internal fixation of T12 & L1 fractures & T11-L3 posterior arthrodesis as per neuro surg.  Continue w/ lidocaine patch. Percocet prn for pain.  Continue Brace while OOB for 6 weeks. --Will need outpatient f/u w/ neuro surg, Dr. Myer Haff.  --Therapy recs SNF.   AKI 2/2 urinary retention  CKDIIIa Acute metabolic acidosis Cr is trending up again today. Decreased urine output. Bladder scan today found >962ml post void  Plan: --place Foley --hold losartan --hold MIVF for now   Fall: at home. Fall precautions    HTN:  continue on metoprolol.  --hold losartan 2/2 AKI   Ulcerative colitis: s/p colectomy and ileostomy.  No acute issues    DM2: HbA1c 6.4, well controlled.  Continue on SSI w/ accuchecks  --increase glargine to 15u nightly   HLD:  continue on statin    Leukocytosis: likely reactive. Labile    OSA:  continue on BiPAP    Morbid obesity: BMI 43.4. Complicates overall care & prognosis     DVT prophylaxis: Heparin SQ Code Status: Full code  Family Communication: husband updated at bedside today Level of care: Med-Surg Dispo:   The patient is from: home Anticipated d/c is to: home Anticipated d/c date is: undetermined   Subjective and Interval History:  Cr continued to trend up.  Today, pt reported pressure and crampy feeling after voiding,  and urinary leakage which was new to her.  Bladder scan ordered, and RN reported >999 ml post void.  Foley ordered.   Objective: Vitals:   05/17/22 1614 05/17/22 2314 05/18/22 0725 05/18/22 1621  BP: (!) 135/48 (!) 158/46 (!) 155/41 (!) 127/40  Pulse: 77 88 82 75  Resp: Temp: 98.2 F (36.8 C) 98.7 F (37.1 C) 98.4 F (36.9 C) 98.2 F (36.8 C)  TempSrc:   Oral   SpO2: 98% 99% 97% 98%  Weight:      Height:        Intake/Output Summary (Last 24 hours) at 05/18/2022 1910 Last data filed at 05/18/2022 1721 Gross per 24 hour  Intake 200 ml  Output 225 ml  Net -25 ml   Filed Weights   05/11/22 1628 05/12/22 0701  Weight: 120.2 kg 120.2 kg    Examination:   Constitutional: NAD, AAOx3 HEENT: conjunctivae and lids normal, EOMI CV: No cyanosis.   RESP: normal respiratory effort, on RA Neuro: II - XII grossly intact.   Psych: more depressed mood and affect.  Appropriate judgement and reason   Data Reviewed: I have personally reviewed labs and imaging studies  Time spent: 50 minutes  Darlin Priestly, MD [facility] If 7PM-7AM, please contact night-coverage 05/18/2022, 7:10 PM

## 2022-05-19 ENCOUNTER — Inpatient Hospital Stay: Payer: Medicare PPO

## 2022-05-19 DIAGNOSIS — S32010A Wedge compression fracture of first lumbar vertebra, initial encounter for closed fracture: Secondary | ICD-10-CM | POA: Diagnosis not present

## 2022-05-19 LAB — HEMOGLOBIN AND HEMATOCRIT, BLOOD
HCT: 29.6 % — ABNORMAL LOW (ref 36.0–46.0)
Hemoglobin: 10 g/dL — ABNORMAL LOW (ref 12.0–15.0)

## 2022-05-19 LAB — BASIC METABOLIC PANEL
Anion gap: 13 (ref 5–15)
BUN: 73 mg/dL — ABNORMAL HIGH (ref 8–23)
CO2: 27 mmol/L (ref 22–32)
Calcium: 7.7 mg/dL — ABNORMAL LOW (ref 8.9–10.3)
Chloride: 93 mmol/L — ABNORMAL LOW (ref 98–111)
Creatinine, Ser: 5.09 mg/dL — ABNORMAL HIGH (ref 0.44–1.00)
GFR, Estimated: 9 mL/min — ABNORMAL LOW (ref >60)
Glucose, Bld: 203 mg/dL — ABNORMAL HIGH (ref 70–99)
Potassium: 3.2 mmol/L — ABNORMAL LOW (ref 3.5–5.1)
Sodium: 133 mmol/L — ABNORMAL LOW (ref 135–145)

## 2022-05-19 LAB — GLUCOSE, CAPILLARY
Glucose-Capillary: 185 mg/dL — ABNORMAL HIGH (ref 70–99)
Glucose-Capillary: 301 mg/dL — ABNORMAL HIGH (ref 70–99)
Glucose-Capillary: 239 mg/dL — ABNORMAL HIGH (ref 70–99)
Glucose-Capillary: 287 mg/dL — ABNORMAL HIGH (ref 70–99)

## 2022-05-19 LAB — TYPE AND SCREEN: ABO/RH(D): A POS

## 2022-05-19 LAB — CBC
HCT: 28.2 % — ABNORMAL LOW (ref 36.0–46.0)
Hemoglobin: 9.4 g/dL — ABNORMAL LOW (ref 12.0–15.0)
MCH: 31.1 pg (ref 26.0–34.0)
MCHC: 33.3 g/dL (ref 30.0–36.0)
MCV: 93.4 fL (ref 80.0–100.0)
Platelets: 206 10*3/uL (ref 150–400)
RBC: 3.02 MIL/uL — ABNORMAL LOW (ref 3.87–5.11)
RDW: 13 % (ref 11.5–15.5)
WBC: 11.3 10*3/uL — ABNORMAL HIGH (ref 4.0–10.5)
nRBC: 0 % (ref 0.0–0.2)

## 2022-05-19 LAB — CULTURE, BLOOD (ROUTINE X 2): Special Requests: ADEQUATE

## 2022-05-19 LAB — MAGNESIUM: Magnesium: 1.9 mg/dL (ref 1.7–2.4)

## 2022-05-19 LAB — BPAM RBC: Unit Type and Rh: 5100

## 2022-05-19 MED ORDER — MAGIC MOUTHWASH
10.0000 mL | Freq: Three times a day (TID) | ORAL | Status: DC
  Administered 2022-05-19 – 2022-05-20 (×4): 10 mL via ORAL
  Filled 2022-05-19 (×7): qty 10

## 2022-05-19 MED ORDER — INSULIN ASPART 100 UNIT/ML IJ SOLN
3.0000 [IU] | Freq: Three times a day (TID) | INTRAMUSCULAR | Status: DC
  Administered 2022-05-19 – 2022-05-21 (×6): 3 [IU] via SUBCUTANEOUS
  Filled 2022-05-19 (×6): qty 1

## 2022-05-19 MED ORDER — LACTATED RINGERS IV SOLN: INTRAVENOUS | Status: DC

## 2022-05-19 MED ORDER — IOHEXOL 9 MG/ML PO SOLN
500.0000 mL | ORAL | Status: AC
  Administered 2022-05-19 (×2): 500 mL via ORAL

## 2022-05-19 MED ORDER — POTASSIUM CHLORIDE CRYS ER 20 MEQ PO TBCR
40.0000 meq | EXTENDED_RELEASE_TABLET | Freq: Once | ORAL | Status: AC
  Administered 2022-05-19: 40 meq via ORAL
  Filled 2022-05-19: qty 2

## 2022-05-19 NOTE — Progress Notes (Signed)
[facility]  ROUNDING NOTE   Subjective:   [name] was admitted to [facility] on 05/10/2022 for Compression fracture of L1 lumbar vertebra (HCC) [S32.010A] Compression fracture of lumbar vertebra, unspecified lumbar vertebral level, initial encounter [facility]) [S32.000A]  Patient is followed by our practice outpatient by Dr Cherylann Ratel.   Patient seen resting in bed, currently eating breakfast No family at bedside Patient states she feels well today Reports multiple attempts at Foley catheter placement overnight Foley catheter with blood-tinged urine  Creatinine 5.09 (7.52) (6.88) (5.43)   Objective:  Vital signs in last 24 hours:  Temp:  [98 F (36.7 C)-99.1 F (37.3 C)] 98 F (36.7 C) (04/26 0803) Pulse Rate:  [75-93] 86 (04/26 0803) Resp:  [17-20] 18 (04/26 0803) BP: (127-135)/(40-64) 135/64 (04/26 0803) SpO2:  [94 %-98 %] 94 % (04/26 0803)  Weight change:  Filed Weights   05/11/22 1628 05/12/22 0701  Weight: 120.2 kg 120.2 kg    Intake/Output: I/O last 3 completed shifts: In: 200 [P.O.:200] Out: 6675 [Urine:6500; Stool:175]   Intake/Output this shift:  Total I/O In: -  Out: 2225 [Urine:1875; Stool:350]  Physical Exam: General: NAD, resting in bed  Head: Normocephalic, atraumatic. Moist oral mucosal membranes  Eyes: Anicteric  Lungs:  Clear to auscultation  Heart: Regular rate and rhythm  Abdomen:  Soft, nontender  Extremities:  no peripheral edema.  Neurologic: Nonfocal, moving all four extremities  Skin: No lesions   GU Foley    Basic Metabolic Panel: Recent Labs  Lab 05/15/22 0416 05/16/22 0638 05/17/22 0614 05/18/22 0431 05/19/22 0542  NA 131* 129* 129* 127* 133*  K 4.4 4.5 4.1 3.5 3.2*  CL 101 99 97* 90* 93*  CO2 19* 17* 20* 24 27  GLUCOSE 229* 229* 254* 259* 203*  BUN 46* 67* 82* 89* 73*  CREATININE 3.29* 5.43* 6.88* 7.52* 5.09*  CALCIUM 8.6* 8.2* 8.1* 7.7* 7.7*  MG  --   --   --  2.2 1.9     Liver Function Tests: No  results for input(s): "AST", "ALT", "ALKPHOS", "BILITOT", "PROT", "ALBUMIN" in the last 168 hours. No results for input(s): "LIPASE", "AMYLASE" in the last 168 hours. No results for input(s): "AMMONIA" in the last 168 hours.  CBC: Recent Labs  Lab 05/15/22 0416 05/16/22 0638 05/17/22 0614 05/18/22 0431 05/19/22 0010 05/19/22 0542  WBC 15.1* 16.2* 15.8* 14.4*  --  11.3*  HGB 12.7 12.2 11.5* 10.7* 10.0* 9.4*  HCT 38.5 37.0 34.2* 31.4* 29.6* 28.2*  MCV 96.7 95.6 94.7 93.5  --  93.4  PLT 246 244 235 228  --  206     Cardiac Enzymes: No results for input(s): "CKTOTAL", "CKMB", "CKMBINDEX", "TROPONINI" in the last 168 hours.  BNP: Invalid input(s): "POCBNP"  CBG: Recent Labs  Lab 05/18/22 0741 05/18/22 1142 05/18/22 1705 05/18/22 2124 05/19/22 0801  GLUCAP 234* 283* 255* 254* 185*     Microbiology: Results for orders placed or performed during the hospital encounter of 05/10/22  Culture, blood (Routine X 2) w Reflex to ID Panel     Status: None (Preliminary result)   Collection Time: 05/18/22  2:12 PM   Specimen: BLOOD  Result Value Ref Range Status   Specimen Description BLOOD BLOOD LEFT FOREARM  Final   Special Requests   Final    BOTTLES DRAWN AEROBIC AND ANAEROBIC Blood Culture adequate volume   Culture   Final    NO GROWTH < 24 HOURS Performed at [facility], [address] [place], [place] [address]    Report Status PENDING  Incomplete  Culture, blood (Routine X 2) w Reflex to ID Panel     Status: None (Preliminary result)   Collection Time: 05/18/22  2:13 PM   Specimen: BLOOD  Result Value Ref Range Status   Specimen Description BLOOD RIGHT ANTECUBITAL  Final   Special Requests   Final    BOTTLES DRAWN AEROBIC AND ANAEROBIC Blood Culture adequate volume   Culture   Final    NO GROWTH < 24 HOURS Performed at [facility], [address], [place], [place] [address]    Report Status PENDING  Incomplete    Coagulation Studies: No  results for input(s): "LABPROT", "INR" in the last 72 hours.  Urinalysis: Recent Labs    05/16/22 1743 05/18/22 1725  COLORURINE YELLOW* YELLOW*  LABSPEC 1.010 1.010  PHURINE 5.0 5.0  GLUCOSEU NEGATIVE NEGATIVE  HGBUR LARGE* MODERATE*  BILIRUBINUR NEGATIVE NEGATIVE  KETONESUR NEGATIVE NEGATIVE  PROTEINUR NEGATIVE NEGATIVE  NITRITE NEGATIVE NEGATIVE  LEUKOCYTESUR TRACE* TRACE*       Imaging: CT ABDOMEN PELVIS WO CONTRAST  Result Date: 05/19/2022 CLINICAL DATA:  Hydronephrosis, hematuria EXAM: CT ABDOMEN AND PELVIS WITHOUT CONTRAST TECHNIQUE: Multidetector CT imaging of the abdomen and pelvis was performed following the standard protocol without IV contrast. RADIATION DOSE REDUCTION: This exam was performed according to the departmental dose-optimization program which includes automated exposure control, adjustment of the mA and/or kV according to patient size and/or use of iterative reconstruction technique. COMPARISON:  05/30/2021 FINDINGS: Lower chest: Bibasilar atelectasis. Cardiac size within normal limits. Trace pericardial effusion. Hepatobiliary: Cirrhosis. Stable capsular calcification along more since pouch. Status post cholecystectomy. No intra or extrahepatic biliary ductal dilation. Pancreas: Unremarkable Spleen: Unremarkable Adrenals/Urinary Tract: The adrenal glands are unremarkable. The kidneys are normal in size and position. Mild asymmetric left hydronephrosis present which appears stable since prior examination and may reflect the sequela of remote distension 1 underlying mild UPJ obstruction. No hydronephrosis on the right. Two nonobstructing renal calculi are seen within the lower pole of the left kidney measuring 4 mm in dimension. No ureteral calculi. A a Foley catheter is seen looped within a decompressed bladder lumen. Stomach/Bowel: Surgical changes of a total proctocolectomy and right lower quadrant and ileostomy are identified. Small parastomal hernia, tiny  periumbilical right parasagittal ventral hernia contain loops of unremarkable small bowel. The stomach and small bowel are otherwise unremarkable and there is no evidence of obstruction or focal inflammation. No free intraperitoneal gas or fluid. Stable infiltrative change within the presacral space, likely postsurgical in nature. Vascular/Lymphatic: Aortic atherosclerosis. No enlarged abdominal or pelvic lymph nodes. Reproductive: Status post hysterectomy. No adnexal masses. Other: Mild subcutaneous edema within the flanks bilaterally. Musculoskeletal: Acute to subacute fracture of the L1 vertebral body status post T11-L3 posterior thoracolumbar fusion with instrumentation. No other acute fracture identified. Degenerative changes are seen within the lumbar spine and hips bilaterally. IMPRESSION: 1. No acute intra-abdominal pathology identified. No definite radiographic explanation for the patient's reported symptoms. 2. Stable mild asymmetric left hydronephrosis, possibly related to the sequela of remote distension or underlying mild UPJ obstruction. 3. Minimal left nonobstructing nephrolithiasis. 4. Cirrhosis. 5. Surgical changes of a total proctocolectomy and right lower quadrant and ileostomy. No evidence of obstruction. 6. Acute to subacute fracture of the L1 vertebral body status post T11-L3 posterior thoracolumbar fusion with instrumentation. 7. Aortic atherosclerosis. Aortic Atherosclerosis (ICD10-I70.0). Electronically Signed   By: Helyn Numbers M.D.   On: 05/19/2022 03:02  Medications:    sodium chloride     lactated ringers 100 mL/hr at 05/19/22 8469    atorvastatin  5 mg Oral QODAY   cholecalciferol  1,000 Units Oral Daily   fluticasone  2 spray Each Nare QHS   heparin  5,000 Units Subcutaneous Q8H   insulin aspart  0-5 Units Subcutaneous QHS   insulin aspart  0-9 Units Subcutaneous TID WC   insulin glargine-yfgn  15 Units Subcutaneous QHS   lidocaine  1 patch Transdermal Q24H    metoprolol tartrate  25 mg Oral Daily   multivitamin with minerals  1 tablet Oral Daily   sodium chloride flush  3 mL Intravenous Q12H   acetaminophen, albuterol, dextromethorphan-guaiFENesin, hydrALAZINE, melatonin, menthol-cetylpyridinium **OR** phenol, methocarbamol, mirabegron ER, ondansetron (ZOFRAN) IV, oxyCODONE-acetaminophen, simethicone, sodium chloride flush  Assessment/ Plan:  [name] is a 67 y.o. white female with diabetes mellitus type II noninsulin dependent, hypertension, nephrolithiasis, sleep apnea, GERD, hyperlipidemia, who is admitted to [facility] on 05/10/2022 for Compression fracture of L1 lumbar vertebra (HCC) [S32.010A] Compression fracture of lumbar vertebra, unspecified lumbar vertebral level, initial encounter (HCC) [S32.000A]  Acute kidney injury with acute metabolic acidosis and hyponatremia on chronic kidney disease stage IIIB with proteinuria secondary to diabetic nephropathy and hypertension. Baseline creatinine of 1.32, GFR of 44 on 04/04/22. No IV contrast exposure. Most likely secondary to her back surgery. Holding Losartan - Renal ultrasound shows mild bilateral hydronephrosis and 8mm nonobstructing left renal calculus. Follows Urology outpatient.   -After complaints of pain after urination and leaking, bladder scan showed greater than 1 L.  Foley catheter placed with 6.45 L of urine recorded. -Creatinine now improving, 5.09.  Hypertension with chronic kidney disease: 132/88. Elevated readings. Patient with pain. Home regimen of losartan and metoprolol. - holding losartan - continue metoprolol  Diabetes mellitus type II with chronic kidney disease: noninsulin dependent. Hemoglobin A1c of 6.4% on 01/31/22. Placed on sliding scale insulin. Not on an SGLT-2 inhibitor as outpatient due to history of acute kidney injury to empagliflozin.  Glucose remains elevated. Primary team to manage SSI   LOS: 8   4/26/202410:51 AM

## 2022-05-19 NOTE — TOC Progression Note (Signed)
Transition of Care [facility]) - Progression Note    Patient Details  Name: MEAGHEN VECCHIARELLI MRN: [id number] Date of Birth: 10/09/1955  [facility] of [facility] CM/SW Contact  Marlowe Sax, RN Phone Number: 05/19/2022, 9:08 AM  Clinical Narrative:    Ins expires Delorise Shiner period for 1 day will end tonight at 1159 PM Auth ID [id number]   Expected Discharge Plan: Skilled Nursing Facility Barriers to Discharge: Insurance Authorization  Expected Discharge Plan and Services   Discharge Planning Services: CM Consult Post Acute Care Choice: Home Health Living arrangements for the past 2 months: Single Family Home                 DME Arranged: N/A DME Agency: NA       HH Arranged: NA           Social Determinants of Health (SDOH) Interventions SDOH Screenings   Food Insecurity: No Food Insecurity (05/13/2022)  Housing: Low Risk  (05/13/2022)  Transportation Needs: No Transportation Needs (05/13/2022)  Utilities: Not At Risk (05/13/2022)  Depression (PHQ2-9): Low Risk  (04/07/2022)  Financial Resource Strain: Low Risk  (05/31/2021)  Physical Activity: Inactive (05/31/2021)  Stress: No Stress Concern Present (05/31/2021)  Tobacco Use: Low Risk  (05/16/2022)    Readmission Risk Interventions     No data to display

## 2022-05-19 NOTE — Plan of Care (Signed)
  Problem: Education: Goal: Knowledge of General Education information will improve Description Including pain rating scale, medication(s)/side effects and non-pharmacologic comfort measures Outcome: Progressing   Problem: Health Behavior/Discharge Planning: Goal: Ability to manage health-related needs will improve Outcome: Progressing   Problem: Clinical Measurements: Goal: Ability to maintain clinical measurements within normal limits will improve Outcome: Progressing Goal: Diagnostic test results will improve Outcome: Progressing   Problem: Activity: Goal: Risk for activity intolerance will decrease Outcome: Progressing   Problem: Nutrition: Goal: Adequate nutrition will be maintained Outcome: Progressing   Problem: Pain Managment: Goal: General experience of comfort will improve Outcome: Progressing   

## 2022-05-19 NOTE — Inpatient Diabetes Management (Signed)
Inpatient Diabetes Program Recommendations  AACE/ADA: New Consensus Statement on Inpatient Glycemic Control (2015)  Target Ranges:  Prepandial:   less than 140 mg/dL      Peak postprandial:   less than 180 mg/dL (1-2 hours)      Critically ill patients:  140 - 180 mg/dL   Lab Results  Component Value Date   GLUCAP 301 (H) 05/19/2022   HGBA1C 6.4 01/31/2022    Review of Glycemic Control  Latest Reference Range & Units 05/19/22 08:01 05/19/22 12:27  Glucose-Capillary 70 - 99 mg/dL 161 (H) 096 (H)  (H): Data is abnormally high  Diabetes history: DM2 Outpatient Diabetes medications: Glipizide 5 mg QD, Mounjaro 5 mg weekly Current orders for Inpatient glycemic control: Semglee 15 QD, Novolog 0-9 units TID and 0-5 units QHS  Inpatient Diabetes Program Recommendations:    Please consider: Novolog 3 units TID with meals if consumes at least 50%  Will continue to follow while inpatient.  Thank you, Dulce Sellar, MSN, CDCES Diabetes Coordinator Inpatient Diabetes Program [phone] (team pager from 8a-5p)

## 2022-05-19 NOTE — Progress Notes (Signed)
  PROGRESS NOTE    Jamie [name]  RUE:[id number] DOB: 1955-10-08 DOA: 05/10/2022 PCP: Judy Pimple, MD  145A/145A-AA  LOS: 8 days   Brief hospital course:   Assessment & Plan: Jamie [name] is a 67 y.o. female with medical history significant of HTN, HLD, DM, CKD-3a, OSA on CPAP, morbid obesity, anemia, gallstone, kidney stone, ulcerative colitis (s/p of colectomy and s/p ileostomy), who presents with fall and lower back pain.    Pt states that she accidentally fell in church yesterday evening, landing on her back, and possibly hit her head.   Compression fracture of L1 lumbar vertebra:  s/p open reduction internal fixation of T12 & L1 fractures & T11-L3 posterior arthrodesis as per neuro surg.  Continue w/ lidocaine patch. Percocet prn for pain.  Continue Brace while OOB for 6 weeks. --Will need outpatient f/u w/ neuro surg, Dr. Myer Haff.  --Therapy recs SNF.   AKI 2/2 urinary retention  CKDIIIa hematuria Cr peaked at 7.52.  Bladder scan 4/25 found >948ml post void.  Foley placed. Plan: --cont Foley for bladder decompression --cont LR@100  --hold losartan   Acute metabolic acidosis, resolved S/p bicarb gtt  Fall:  at home. Fall precautions    HTN:  continue on metoprolol.  --hold losartan 2/2 AKI   Ulcerative colitis: s/p colectomy and ileostomy.  No acute issues    DM2: HbA1c 6.4, well controlled.  Continue on SSI w/ accuchecks  --cont glargine to 15u nightly --add mealtime 3u TID   HLD:  continue on statin    Leukocytosis:  likely reactive.    OSA:  continue on BiPAP    Morbid obesity: BMI 43.4.  Complicates overall care & prognosis     DVT prophylaxis: Heparin SQ Code Status: Full code  Family Communication: husband updated at bedside today Level of care: Med-Surg Dispo:   The patient is from: home Anticipated d/c is to: home Anticipated d/c date is: 2-3 days   Subjective and Interval History:  Foley placed last night with 6.5L urine  output overnight.    Pt reported feeling better.   Objective: Vitals:   05/18/22 0725 05/18/22 1621 05/18/22 2335 05/19/22 0803  BP: (!) 155/41 (!) 127/40 (!) 129/45 135/64  Pulse: 82 75 93 86  Resp: 19 17 20 18   Temp: 98.4 F (36.9 C) 98.2 F (36.8 C) 99.1 F (37.3 C) 98 F (36.7 C)  TempSrc: Oral  Oral   SpO2: 97% 98% 97% 94%  Weight:      Height:        Intake/Output Summary (Last 24 hours) at 05/19/2022 1419 Last data filed at 05/19/2022 1059 Gross per 24 hour  Intake --  Output 8875 ml  Net -8875 ml   Filed Weights   05/11/22 1628 05/12/22 0701  Weight: 120.2 kg 120.2 kg    Examination:   Constitutional: NAD, AAOx3 HEENT: conjunctivae and lids normal, EOMI CV: No cyanosis.   RESP: normal respiratory effort, on RA Neuro: II - XII grossly intact.   Psych: Normal mood and affect.  Appropriate judgement and reason Foley present with red urine   Data Reviewed: I have personally reviewed labs and imaging studies  Time spent: 35 minutes  Darlin Priestly, MD [facility] If 7PM-7AM, please contact night-coverage 05/19/2022, 2:19 PM

## 2022-05-19 NOTE — Progress Notes (Addendum)
       CROSS COVER NOTE  NAME: Jamie [name] MRN: [id number] DOB : [birth year]    HPI/Events of Note   Report:development of hematuria post foley placement.  Initial output was amber in color.   On review of chart:document difficult foley placement. Foley place secondary to urinary retention that resulted n hydronephrosis and severe renal failure    Assessment and  Interventions   Assessment: Hemodynamically stable. 129/45 HT 95 HGB stable at 10 Repeat CT abd pelvis IMPRESSION: 1. No acute intra-abdominal pathology identified. No definite radiographic explanation for the patient's reported symptoms. 2. Stable mild asymmetric left hydronephrosis, possibly related to the sequela of remote distension or underlying mild UPJ obstruction. 3. Minimal left nonobstructing nephrolithiasis. 4. Cirrhosis. 5. Surgical changes of a total proctocolectomy and right lower quadrant and ileostomy. No evidence of obstruction. 6. Acute to subacute fracture of the L1 vertebral body status post T11-L3 posterior thoracolumbar fusion with instrumentation. 7. Aortic atherosclerosis.  Plan:  Type and screen completed Continue to monitor - serial CBC if ongoing frank hematuria      Donnie Mesa NP [facility]

## 2022-05-19 NOTE — Consult Note (Addendum)
   [facility] Inpatient Consult   05/19/2022  Jamie [name] [birth year] [id number]   Location: [facility] RN Hospital Liaison screened remotely [facility]).   [facility] Customer service manager [facility]) Accountable Care Organization [ACO] Patient: Chemical engineer)    Primary Care Provider:  Judy Pimple, MD  [facility]  Patient screened for readmission hospitalization with noted Medium risk score for unplanned readmission risk with 1 IP in 6 months. [facility]/Population Health RN liaison will assess for potential [facility]) Care Management service needs for post hospital transition for care coordination. Pt opt to home with [facility] services.  Plan: [facility]/Population Health RN Liaison will continue to follow ongoing disposition in assessing for post hospital community care coordination/management needs.  Referral request for community care coordination: Anticipate post-hospital follow up call.  [facility] Care Management/Population Health does not replace or interfere with any arrangements made by the Inpatient Transition of Care team.   For questions contact:      Elliot Cousin, RN, BSN [facility] Liaison [place]   [facility]  Population Health Office Hours MTWF 8:00 am to 6 pm off on Thursday [phone] mobile [phone] [Office toll free line][facility] Office Hours are M-F 8:30 - 5 pm 24 hour nurse advise line [phone] Conceirge  [name]

## 2022-05-19 NOTE — Progress Notes (Signed)
Physical Therapy Treatment Patient Details Name: Jamie [name] MRN: [id number] DOB: Oct 10, 1955 Today's Date: 05/19/2022   History of Present Illness Pt is a 67 y/o F admitted on 05/10/22 after presenting with c/o a fall resulting in lower back pain. Pt found to have compression fx of L1 lumbar vertebra. Neurosurgery was consulted & pt underwent posterior thoracic fusion T11-L3 on 05/12/22. PMH: HTN, HLD, DM, CKD3a, OSA on CPAP, morbid obesity, anemia, gallstone, kidney stone, ulcerative colitis s/p colectomy & ileostomy, peripheral neuropathy    PT Comments    Pt was long sitting in bed upon arrival. She is A and O x 4 with supportive spouse present. Pt was agreeable to session. Remains slow moving and very cautious. Was able to perform log roll technique for achieve EOB short sit form supine position. Pt required extensive assistance to apply TLSO brace in sitting. Brace adjusted once in standing. She tolerated gait in hallway well but tends to have poor posture. Constant vcs for posture correction and staying closer to front of RW. Pt is able to correct but unable to maintain erect posture." I'll keep working on it. I was flexed prior to surgery too." Pt is progressing however continues to require assistance with mobility transfers, and ADLs. DC recs remain appropriate. Recommend continued skilled PT at DC to maximize her independence and safety with all ADLs.    Recommendations for follow up therapy are one component of a multi-disciplinary discharge planning process, led by the attending physician.  Recommendations may be updated based on patient status, additional functional criteria and insurance authorization.     Assistance Recommended at Discharge Frequent or constant Supervision/Assistance  Patient can return home with the following A little help with walking and/or transfers;A little help with bathing/dressing/bathroom;Assistance with cooking/housework;Direct supervision/assist for  medications management;Direct supervision/assist for financial management;Assist for transportation;Help with stairs or ramp for entrance   Equipment Recommendations  Other (comment) (Defer to next level of care.)        Precautions Precautions: Fall;Back Precaution Booklet Issued: Yes (comment) Required Braces or Orthoses: Spinal Brace Cervical Brace: For comfort Spinal Brace: Thoracolumbosacral orthotic;Applied in sitting position Restrictions Weight Bearing Restrictions: No     Mobility  Bed Mobility Overal bed mobility: Needs Assistance Bed Mobility: Rolling, Sidelying to Sit, Supine to Sit Rolling: Min guard Sidelying to sit: Min assist Supine to sit: Min assist  General bed mobility comments: Increased time to perform however was able to correctly perform log roll  technique    Transfers Overall transfer level: Needs assistance Equipment used: Rolling walker (2 wheels) Transfers: Sit to/from Stand Sit to Stand: Min guard General transfer comment: CGA to stand from slightly elevated bed height. pt required alot of assistance to correctly place then adjust TLSO to proper fit    Ambulation/Gait Ambulation/Gait assistance: Min guard Gait Distance (Feet): 200 Feet Assistive device: Rolling walker (2 wheels) Gait Pattern/deviations: Trunk flexed, Step-through pattern Gait velocity: decreased  General Gait Details: Pt has poor gait posture however no LOB or safety concerns. Vcs throughout for staying closer to front of RW and correct posture. pt is able to correct but unable to maintain erect posture during ambulation.   Balance Overall balance assessment: Needs assistance Sitting-balance support: Feet supported, Bilateral upper extremity supported Sitting balance-Leahy Scale: Good     Standing balance support: Bilateral upper extremity supported, During functional activity, Reliant on assistive device for balance, Single extremity supported Standing balance-Leahy  Scale: Fair Standing balance comment: heavy reliance on walker during all standing dynamic  activity         Cognition Arousal/Alertness: Awake/alert Behavior During Therapy: WFL for tasks assessed/performed Overall Cognitive Status: Within Functional Limits for tasks assessed      General Comments: Pt is A and O x 4. Overall less anxious today than previous date               Pertinent Vitals/Pain Pain Assessment Pain Assessment: 0-10 Pain Score: 6  Pain Location: back Pain Descriptors / Indicators: Aching Pain Intervention(s): Limited activity within patient's tolerance, Monitored during session, Premedicated before session, Repositioned     PT Goals (current goals can now be found in the care plan section) Acute Rehab PT Goals Patient Stated Goal: " move better with less pain." Progress towards PT goals: Progressing toward goals    Frequency    7X/week      PT Plan Current plan remains appropriate       AM-PAC PT "6 Clicks" Mobility   Outcome Measure  Help needed turning from your back to your side while in a flat bed without using bedrails?: A Little Help needed moving from lying on your back to sitting on the side of a flat bed without using bedrails?: A Little Help needed moving to and from a bed to a chair (including a wheelchair)?: A Little Help needed standing up from a chair using your arms (e.g., wheelchair or bedside chair)?: A Little Help needed to walk in hospital room?: A Little Help needed climbing 3-5 steps with a railing? : A Lot 6 Click Score: 17    End of Session   Activity Tolerance: Patient tolerated treatment well Patient left: in chair;with call bell/phone within reach;with chair alarm set;with family/visitor present Nurse Communication: Mobility status PT Visit Diagnosis: Muscle weakness (generalized) (M62.81);Pain;Difficulty in walking, not elsewhere classified (R26.2);Other abnormalities of gait and mobility (R26.89);Unsteadiness  on feet (R26.81)     Time: 1610-9604 PT Time Calculation (min) (ACUTE ONLY): 23 min  Charges:  $Gait Training: 8-22 mins $Therapeutic Activity: 8-22 mins                     Jetta Lout PTA 05/19/22, 9:48 AM

## 2022-05-19 NOTE — Progress Notes (Signed)
Occupational Therapy Treatment Patient Details Name: Jamie [name] MRN: [id number] DOB: [birth year] Today's Date: 05/19/2022   History of present illness Pt is a 67 y/o F admitted on 05/10/22 after presenting with c/o a fall resulting in lower back pain. Pt found to have compression fx of L1 lumbar vertebra. Neurosurgery was consulted & pt underwent posterior thoracic fusion T11-L3 on 05/12/22. PMH: HTN, HLD, DM, CKD3a, OSA on CPAP, morbid obesity, anemia, gallstone, kidney stone, ulcerative colitis s/p colectomy & ileostomy, peripheral neuropathy   OT comments  OT facilitated problem solving ADL routines with pt and spouse based on home layout, AE/DME options and recs and current limitations specific to bathing, toileting, and dressing. Education provided in specific recommendations and considerations for home. Pt/spouse verbalized understanding and pt voiced appreciation for instruction. Continues to benefit from OT.   Recommendations for follow up therapy are one component of a multi-disciplinary discharge planning process, led by the attending physician.  Recommendations may be updated based on patient status, additional functional criteria and insurance authorization.    Assistance Recommended at Discharge Frequent or constant Supervision/Assistance  Patient can return home with the following  A little help with walking and/or transfers;A lot of help with bathing/dressing/bathroom;Assistance with cooking/housework;Help with stairs or ramp for entrance;Assist for transportation   Equipment Recommendations       Recommendations for Other Services      Precautions / Restrictions Precautions Precautions: Fall;Back Precaution Booklet Issued: Yes (comment) Required Braces or Orthoses: Spinal Brace Cervical Brace: For comfort Spinal Brace: Thoracolumbosacral orthotic;Applied in sitting position Restrictions Weight Bearing Restrictions: No       Mobility Bed Mobility                General bed mobility comments: NT in recliner    Transfers                   General transfer comment: declined     Balance                                           ADL either performed or assessed with clinical judgement   ADL                                              Extremity/Trunk Assessment              Vision       Perception     Praxis      Cognition Arousal/Alertness: Awake/alert Behavior During Therapy: WFL for tasks assessed/performed Overall Cognitive Status: Within Functional Limits for tasks assessed                                          Exercises Other Exercises Other Exercises: OT facilitated problem solving ADL routines with pt and spouse based on home layout, AE/DME recs and current limitations specific to bathing, toileting, and dressing. Education provided in specific recommendations and considerations for home.    Shoulder Instructions       General Comments      Pertinent Vitals/ Pain       Pain Assessment Pain Assessment: No/denies pain  Home Living  Prior Functioning/Environment              Frequency  Min 3X/week        Progress Toward Goals  OT Goals(current goals can now be found in the care plan section)  Progress towards OT goals: Progressing toward goals  Acute Rehab OT Goals Patient Stated Goal: to improve pain OT Goal Formulation: With patient Time For Goal Achievement: 05/27/22 Potential to Achieve Goals: Good  Plan Discharge plan remains appropriate;Frequency remains appropriate    Co-evaluation                 AM-PAC OT "6 Clicks" Daily Activity     Outcome Measure   Help from another person eating meals?: None Help from another person taking care of personal grooming?: A Little Help from another person toileting, which includes using toliet, bedpan, or urinal?:  A Lot Help from another person bathing (including washing, rinsing, drying)?: A Lot Help from another person to put on and taking off regular upper body clothing?: A Little Help from another person to put on and taking off regular lower body clothing?: A Lot 6 Click Score: 16    End of Session    OT Visit Diagnosis: Other abnormalities of gait and mobility (R26.89);Muscle weakness (generalized) (M62.81)   Activity Tolerance Patient tolerated treatment well   Patient Left in chair;with call bell/phone within reach;with family/visitor present   Nurse Communication          Time: 1610-9604 OT Time Calculation (min): 31 min  Charges: OT General Charges $OT Visit: 1 Visit OT Treatments $Self Care/Home Management : 23-37 mins  Arman Filter., MPH, MS, OTR/L ascom [phone] 05/19/22, 12:14 PM

## 2022-05-20 DIAGNOSIS — S32010A Wedge compression fracture of first lumbar vertebra, initial encounter for closed fracture: Secondary | ICD-10-CM | POA: Diagnosis not present

## 2022-05-20 LAB — GLUCOSE, CAPILLARY
Glucose-Capillary: 215 mg/dL — ABNORMAL HIGH (ref 70–99)
Glucose-Capillary: 258 mg/dL — ABNORMAL HIGH (ref 70–99)
Glucose-Capillary: 215 mg/dL — ABNORMAL HIGH (ref 70–99)
Glucose-Capillary: 168 mg/dL — ABNORMAL HIGH (ref 70–99)

## 2022-05-20 LAB — CULTURE, BLOOD (ROUTINE X 2)

## 2022-05-20 LAB — CBC
HCT: 29.6 % — ABNORMAL LOW (ref 36.0–46.0)
Hemoglobin: 9.7 g/dL — ABNORMAL LOW (ref 12.0–15.0)
MCH: 31.5 pg (ref 26.0–34.0)
MCHC: 32.8 g/dL (ref 30.0–36.0)
MCV: 96.1 fL (ref 80.0–100.0)
Platelets: 226 10*3/uL (ref 150–400)
RBC: 3.08 MIL/uL — ABNORMAL LOW (ref 3.87–5.11)
RDW: 13.2 % (ref 11.5–15.5)
WBC: 8.8 10*3/uL (ref 4.0–10.5)
nRBC: 0 % (ref 0.0–0.2)

## 2022-05-20 LAB — BASIC METABOLIC PANEL
Anion gap: 7 (ref 5–15)
BUN: 35 mg/dL — ABNORMAL HIGH (ref 8–23)
CO2: 32 mmol/L (ref 22–32)
Calcium: 8.4 mg/dL — ABNORMAL LOW (ref 8.9–10.3)
Chloride: 103 mmol/L (ref 98–111)
Creatinine, Ser: 1.64 mg/dL — ABNORMAL HIGH (ref 0.44–1.00)
GFR, Estimated: 34 mL/min — ABNORMAL LOW (ref >60)
Glucose, Bld: 175 mg/dL — ABNORMAL HIGH (ref 70–99)
Potassium: 3.2 mmol/L — ABNORMAL LOW (ref 3.5–5.1)
Sodium: 142 mmol/L (ref 135–145)

## 2022-05-20 LAB — MAGNESIUM: Magnesium: 1.7 mg/dL (ref 1.7–2.4)

## 2022-05-20 MED ORDER — INSULIN GLARGINE-YFGN 100 UNIT/ML ~~LOC~~ SOLN
18.0000 [IU] | Freq: Every day | SUBCUTANEOUS | Status: DC
  Administered 2022-05-20 – 2022-05-23 (×4): 18 [IU] via SUBCUTANEOUS
  Filled 2022-05-20 (×5): qty 0.18

## 2022-05-20 MED ORDER — LOSARTAN POTASSIUM 25 MG PO TABS
25.0000 mg | ORAL_TABLET | Freq: Every day | ORAL | Status: DC
  Administered 2022-05-20 – 2022-05-24 (×5): 25 mg via ORAL
  Filled 2022-05-20 (×5): qty 1

## 2022-05-20 MED ORDER — POTASSIUM CHLORIDE CRYS ER 20 MEQ PO TBCR
40.0000 meq | EXTENDED_RELEASE_TABLET | Freq: Once | ORAL | Status: AC
  Administered 2022-05-20: 40 meq via ORAL
  Filled 2022-05-20: qty 2

## 2022-05-20 NOTE — Progress Notes (Signed)
[facility]  ROUNDING NOTE   Subjective:   [name] was admitted to [facility] on 05/10/2022 for Compression fracture of L1 lumbar vertebra (HCC) [S32.010A] Compression fracture of lumbar vertebra, unspecified lumbar vertebral level, initial encounter [facility]) [S32.000A]  Patient is followed by our practice outpatient by Dr Cherylann Ratel.   Patient seen resting in bed, currently eating breakfast Patient's husband is at bedside. Patient states she feels well today Currently has  Foley catheter with blood-tinged urine.  Good urine output. Serum creatinine has improved significantly   Objective:  Vital signs in last 24 hours:  Temp:  [98.6 F (37 C)] 98.6 F (37 C) (04/26 2331) Pulse Rate:  [82] 82 (04/26 2331) Resp:  [18-20] 20 (04/26 2331) BP: (138-155)/(40-41) 155/40 (04/26 2331) SpO2:  [95 %-98 %] 95 % (04/26 2331) FiO2 (%):  [21 %] 21 % (04/26 1900)  Weight change:  Filed Weights   05/11/22 1628 05/12/22 0701  Weight: 120.2 kg 120.2 kg    Intake/Output: I/O last 3 completed shifts: In: 1839.2 [I.V.:1839.2] Out: [address] [Urine:12025; Stool:1000]   Intake/Output this shift:  Total I/O In: -  Out: 1000 [Urine:800; Stool:200]  Physical Exam: General: NAD, resting in bed  Head: Normocephalic, atraumatic. Moist oral mucosal membranes  Eyes: Anicteric  Lungs:  Clear to auscultation  Heart: Regular rate and rhythm  Abdomen:  Soft, nontender  Extremities:  no peripheral edema.  Neurologic: Nonfocal, moving all four extremities  Skin: No lesions   GU Foley    Basic Metabolic Panel: Recent Labs  Lab 05/16/22 0638 05/17/22 0614 05/18/22 0431 05/19/22 0542 05/20/22 0549  NA 129* 129* 127* 133* 142  K 4.5 4.1 3.5 3.2* 3.2*  CL 99 97* 90* 93* 103  CO2 17* 20* 24 27 32  GLUCOSE 229* 254* 259* 203* 175*  BUN 67* 82* 89* 73* 35*  CREATININE 5.43* 6.88* 7.52* 5.09* 1.64*  CALCIUM 8.2* 8.1* 7.7* 7.7* 8.4*  MG  --   --  2.2 1.9 1.7     Liver Function  Tests: No results for input(s): "AST", "ALT", "ALKPHOS", "BILITOT", "PROT", "ALBUMIN" in the last 168 hours. No results for input(s): "LIPASE", "AMYLASE" in the last 168 hours. No results for input(s): "AMMONIA" in the last 168 hours.  CBC: Recent Labs  Lab 05/16/22 0638 05/17/22 0614 05/18/22 0431 05/19/22 0010 05/19/22 0542 05/20/22 0549  WBC 16.2* 15.8* 14.4*  --  11.3* 8.8  HGB 12.2 11.5* 10.7* 10.0* 9.4* 9.7*  HCT 37.0 34.2* 31.4* 29.6* 28.2* 29.6*  MCV 95.6 94.7 93.5  --  93.4 96.1  PLT 244 235 228  --  206 226     Cardiac Enzymes: No results for input(s): "CKTOTAL", "CKMB", "CKMBINDEX", "TROPONINI" in the last 168 hours.  BNP: Invalid input(s): "POCBNP"  CBG: Recent Labs  Lab 05/19/22 0801 05/19/22 1227 05/19/22 1706 05/19/22 2124 05/20/22 0746  GLUCAP 185* 301* 239* 287* 215*     Microbiology: Results for orders placed or performed during the hospital encounter of 05/10/22  Culture, blood (Routine X 2) w Reflex to ID Panel     Status: None (Preliminary result)   Collection Time: 05/18/22  2:12 PM   Specimen: BLOOD  Result Value Ref Range Status   Specimen Description BLOOD BLOOD LEFT FOREARM  Final   Special Requests   Final    BOTTLES DRAWN AEROBIC AND ANAEROBIC Blood Culture adequate volume   Culture   Final    NO GROWTH 2 DAYS Performed at [facility],  [address], [place], [place] [address]    Report Status PENDING  Incomplete  Culture, blood (Routine X 2) w Reflex to ID Panel     Status: None (Preliminary result)   Collection Time: 05/18/22  2:13 PM   Specimen: BLOOD  Result Value Ref Range Status   Specimen Description BLOOD RIGHT ANTECUBITAL  Final   Special Requests   Final    BOTTLES DRAWN AEROBIC AND ANAEROBIC Blood Culture adequate volume   Culture   Final    NO GROWTH 2 DAYS Performed at [facility], [address] [place], [place] [address]    Report Status PENDING  Incomplete    Coagulation  Studies: No results for input(s): "LABPROT", "INR" in the last 72 hours.  Urinalysis: Recent Labs    05/18/22 1725  COLORURINE YELLOW*  LABSPEC 1.010  PHURINE 5.0  GLUCOSEU NEGATIVE  HGBUR MODERATE*  BILIRUBINUR NEGATIVE  KETONESUR NEGATIVE  PROTEINUR NEGATIVE  NITRITE NEGATIVE  LEUKOCYTESUR TRACE*       Imaging: CT ABDOMEN PELVIS WO CONTRAST  Result Date: 05/19/2022 CLINICAL DATA:  Hydronephrosis, hematuria EXAM: CT ABDOMEN AND PELVIS WITHOUT CONTRAST TECHNIQUE: Multidetector CT imaging of the abdomen and pelvis was performed following the standard protocol without IV contrast. RADIATION DOSE REDUCTION: This exam was performed according to the departmental dose-optimization program which includes automated exposure control, adjustment of the mA and/or kV according to patient size and/or use of iterative reconstruction technique. COMPARISON:  05/30/2021 FINDINGS: Lower chest: Bibasilar atelectasis. Cardiac size within normal limits. Trace pericardial effusion. Hepatobiliary: Cirrhosis. Stable capsular calcification along more since pouch. Status post cholecystectomy. No intra or extrahepatic biliary ductal dilation. Pancreas: Unremarkable Spleen: Unremarkable Adrenals/Urinary Tract: The adrenal glands are unremarkable. The kidneys are normal in size and position. Mild asymmetric left hydronephrosis present which appears stable since prior examination and may reflect the sequela of remote distension 1 underlying mild UPJ obstruction. No hydronephrosis on the right. Two nonobstructing renal calculi are seen within the lower pole of the left kidney measuring 4 mm in dimension. No ureteral calculi. A a Foley catheter is seen looped within a decompressed bladder lumen. Stomach/Bowel: Surgical changes of a total proctocolectomy and right lower quadrant and ileostomy are identified. Small parastomal hernia, tiny periumbilical right parasagittal ventral hernia contain loops of unremarkable small  bowel. The stomach and small bowel are otherwise unremarkable and there is no evidence of obstruction or focal inflammation. No free intraperitoneal gas or fluid. Stable infiltrative change within the presacral space, likely postsurgical in nature. Vascular/Lymphatic: Aortic atherosclerosis. No enlarged abdominal or pelvic lymph nodes. Reproductive: Status post hysterectomy. No adnexal masses. Other: Mild subcutaneous edema within the flanks bilaterally. Musculoskeletal: Acute to subacute fracture of the L1 vertebral body status post T11-L3 posterior thoracolumbar fusion with instrumentation. No other acute fracture identified. Degenerative changes are seen within the lumbar spine and hips bilaterally. IMPRESSION: 1. No acute intra-abdominal pathology identified. No definite radiographic explanation for the patient's reported symptoms. 2. Stable mild asymmetric left hydronephrosis, possibly related to the sequela of remote distension or underlying mild UPJ obstruction. 3. Minimal left nonobstructing nephrolithiasis. 4. Cirrhosis. 5. Surgical changes of a total proctocolectomy and right lower quadrant and ileostomy. No evidence of obstruction. 6. Acute to subacute fracture of the L1 vertebral body status post T11-L3 posterior thoracolumbar fusion with instrumentation. 7. Aortic atherosclerosis. Aortic Atherosclerosis (ICD10-I70.0). Electronically Signed   By: Helyn Numbers M.D.   On: 05/19/2022 03:02     Medications:    sodium chloride  lactated ringers 100 mL/hr at 05/20/22 0419    atorvastatin  5 mg Oral QODAY   cholecalciferol  1,000 Units Oral Daily   fluticasone  2 spray Each Nare QHS   heparin  5,000 Units Subcutaneous Q8H   insulin aspart  0-5 Units Subcutaneous QHS   insulin aspart  0-9 Units Subcutaneous TID WC   insulin aspart  3 Units Subcutaneous TID WC   insulin glargine-yfgn  15 Units Subcutaneous QHS   lidocaine  1 patch Transdermal Q24H   magic mouthwash  10 mL Oral TID    metoprolol tartrate  25 mg Oral Daily   multivitamin with minerals  1 tablet Oral Daily   sodium chloride flush  3 mL Intravenous Q12H   acetaminophen, albuterol, dextromethorphan-guaiFENesin, hydrALAZINE, melatonin, menthol-cetylpyridinium **OR** phenol, methocarbamol, mirabegron ER, ondansetron (ZOFRAN) IV, oxyCODONE-acetaminophen, simethicone, sodium chloride flush  Assessment/ Plan:  Jamie [name] is a 67 y.o. white female with diabetes mellitus type II noninsulin dependent, hypertension, nephrolithiasis, sleep apnea, GERD, hyperlipidemia, who is admitted to [facility] on 05/10/2022 for Compression fracture of L1 lumbar vertebra (HCC) [S32.010A] Compression fracture of lumbar vertebra, unspecified lumbar vertebral level, initial encounter (HCC) [S32.000A]  Acute kidney injury with acute metabolic acidosis and hyponatremia on chronic kidney disease stage IIIB with proteinuria secondary to diabetic nephropathy and hypertension. Baseline creatinine of 1.32, GFR of 44 on 04/04/22. No IV contrast exposure.  - Renal ultrasound shows mild bilateral hydronephrosis and 8mm nonobstructing left renal calculus. Follows Urology outpatient.   - AKI due to urinary retention.  Creatinine and UOP Improved significantly after Foley placement.   -Will need ongoing urology follow-up.  Hypertension with chronic kidney disease:   Elevated BPs are noted.  Home regimen of losartan and metoprolol. -both metoprolol and losartan can be restarted.   Diabetes mellitus type II with chronic kidney disease: noninsulin dependent. Hemoglobin A1c of 6.4% on 01/31/22. Placed on sliding scale insulin. Not on an SGLT-2 inhibitor as outpatient due to history of acute kidney injury to empagliflozin.  Glucose remains elevated. Primary team to manage SSI   LOS: 9 Jamari Diana 4/27/20249:00 AM

## 2022-05-20 NOTE — Progress Notes (Signed)
Mobility Specialist - Progress Note   05/20/22 1311  Mobility  Activity Ambulated with assistance in hallway  Level of Assistance Standby assist, set-up cues, supervision of patient - no hands on  Assistive Device Front wheel walker  Distance Ambulated (ft) 240 ft  Activity Response Tolerated well  Mobility Referral Yes  $Mobility charge 1 Mobility   Pt sitting in recliner on RA upon arrival. Pt needs ModA to apply TLSO brace. Pt STS and ambulates in hallway SBA with no LOB noted. Pt returns to recliner with needs in reach, TLSO brace removed, and family present.   Terrilyn Saver  Mobility Specialist  05/20/22 1:13 PM

## 2022-05-20 NOTE — Plan of Care (Signed)
  Problem: Pain Management: Goal: Pain level will decrease Outcome: Progressing   Problem: Skin Integrity: Goal: Will show signs of wound healing Outcome: Progressing   Problem: Clinical Measurements: Goal: Ability to maintain clinical measurements within normal limits will improve Outcome: Progressing   Problem: Activity: Goal: Ability to avoid complications of mobility impairment will improve Outcome: Progressing   Problem: Education: Goal: Ability to verbalize activity precautions or restrictions will improve Outcome: Progressing

## 2022-05-20 NOTE — Progress Notes (Signed)
A consult was placed to the hospital's IV Nurse for new IV access; left forearm is edematous from an infiltrate;  both the right arm and Left upper arm were assessed thoroughly with ultrasound; very poor veins that do not compress; no attempts made.  RN aware.

## 2022-05-20 NOTE — Progress Notes (Signed)
  PROGRESS NOTE    Jamie [name]  ZOX:[id number] DOB: March 27, 1955 DOA: 05/10/2022 PCP: Jamie Pimple, MD  145A/145A-AA  LOS: 9 days   Brief hospital course:   Assessment & Plan: Jamie [name] is a 67 y.o. female with medical history significant of HTN, HLD, DM, CKD-3a, OSA on CPAP, morbid obesity, anemia, gallstone, kidney stone, ulcerative colitis (s/p of colectomy and s/p ileostomy), who presents with fall and lower back pain.    Pt states that she accidentally fell in church yesterday evening, landing on her back, and possibly hit her head.   Compression fracture of L1 lumbar vertebra:  s/p open reduction internal fixation of T12 & L1 fractures & T11-L3 posterior arthrodesis as per neuro surg.  Continue w/ lidocaine patch. Percocet prn for pain.  Continue Brace while OOB for 6 weeks. --Will need outpatient f/u w/ neuro surg, Dr. Myer [name].  --SNF rehab  AKI 2/2 urinary retention  CKDIIIa hematuria Cr peaked at 7.52.  Bladder scan 4/25 found >957ml post void.  Foley placed.  Cr improved after bladder decompression. Plan: --cont Foley for bladder decompression.  Will discharge with Foley. --cont LR@100  --hold losartan   Acute metabolic acidosis, resolved S/p bicarb gtt  Fall:  at home. Fall precautions    HTN:  continue on metoprolol.  --hold losartan 2/2 AKI   Ulcerative colitis: s/p colectomy and ileostomy.  No acute issues    DM2: HbA1c 6.4, well controlled.  Continue on SSI w/ accuchecks  --increase glargine to 18u nightly --cont mealtime 3u TID   HLD:  continue on statin    Leukocytosis:  likely reactive.    OSA:  continue on BiPAP    Morbid obesity: BMI 43.4.  Complicates overall care & prognosis     DVT prophylaxis: Heparin SQ Code Status: Full code  Family Communication: husband updated at bedside today Level of care: Med-Surg Dispo:   The patient is from: home Anticipated d/c is to: SNF rehab Anticipated d/c date is: medically ready in  1-2 days   Subjective and Interval History:  Continued to have large amount of urine output and hematuria.  Cr much improved.     Objective: Vitals:   05/19/22 0803 05/19/22 1640 05/19/22 2331 05/20/22 0855  BP: 135/64 (!) 138/41 (!) 155/40 (!) 146/63  Pulse: 86 82 82 82  Resp: 18 18 20 16   Temp: 98 F (36.7 C)  98.6 F (37 C) 98.6 F (37 C)  TempSrc:      SpO2: 94% 98% 95% 99%  Weight:      Height:        Intake/Output Summary (Last 24 hours) at 05/20/2022 1622 Last data filed at 05/20/2022 1425 Gross per 24 hour  Intake 2199.16 ml  Output 5100 ml  Net -2900.84 ml   Filed Weights   05/11/22 1628 05/12/22 0701  Weight: 120.2 kg 120.2 kg    Examination:   Constitutional: NAD, AAOx3 HEENT: conjunctivae and lids normal, EOMI CV: No cyanosis.   RESP: normal respiratory effort, on RA Neuro: II - XII grossly intact.   Psych: Normal mood and affect.  Appropriate judgement and reason  Foley present with red urine and some blood clots   Data Reviewed: I have personally reviewed labs and imaging studies  Time spent: 35 minutes  Jamie Priestly, MD [facility] If 7PM-7AM, please contact night-coverage 05/20/2022, 4:22 PM

## 2022-05-20 NOTE — Progress Notes (Signed)
Physical Therapy Treatment Patient Details Name: Jamie [name] MRN: [id number] DOB: 21-Feb-1955 Today's Date: 05/20/2022   History of Present Illness Pt is a 67 y/o F admitted on 05/10/22 after presenting with c/o a fall resulting in lower back pain. Pt found to have compression fx of L1 lumbar vertebra. Neurosurgery was consulted & pt underwent posterior thoracic fusion T11-L3 on 05/12/22. PMH: HTN, HLD, DM, CKD3a, OSA on CPAP, morbid obesity, anemia, gallstone, kidney stone, ulcerative colitis s/p colectomy & ileostomy, peripheral neuropathy    PT Comments    Pt was long sitting in bed upon arrival. She agrees to session but continues to have some anxiety/ fear with mobility. Pt is A and O x 4 and demonstrating improving abilities with less pain overall. Pt requires increased time to log roll L from mostly flat bed surface. Min assist to achieve EOB short sit. Sat EOB while author assisted pt with placement of TLSO. Session progressed to standing and ambulating around RN stations with constant vcs for posture correction and staying inside RW. Overall pt is progressing. Both pt and spouse do not feel they can safely manage at home in current state. Acute PT will continue to follow and progress per current POC.     Recommendations for follow up therapy are one component of a multi-disciplinary discharge planning process, led by the attending physician.  Recommendations may be updated based on patient status, additional functional criteria and insurance authorization.     Assistance Recommended at Discharge Intermittent Supervision/Assistance  Patient can return home with the following A little help with walking and/or transfers;A little help with bathing/dressing/bathroom;Assistance with cooking/housework;Direct supervision/assist for medications management;Direct supervision/assist for financial management;Assist for transportation;Help with stairs or ramp for entrance   Equipment Recommendations   Other (comment) (Defer to next level of care)       Precautions / Restrictions Precautions Precautions: Fall;Back Precaution Booklet Issued: Yes (comment) Required Braces or Orthoses: Spinal Brace Cervical Brace: For comfort Spinal Brace: Thoracolumbosacral orthotic;Applied in sitting position Restrictions Weight Bearing Restrictions: No     Mobility  Bed Mobility Overal bed mobility: Needs Assistance Bed Mobility: Rolling, Sidelying to Sit, Supine to Sit Rolling: Min guard Sidelying to sit: Min assist Supine to sit: Min assist  General bed mobility comments: pt still requires some assistance + heavy use of bed rails and bed functions to exit L side of bed via log roll technique. once seted EOB, pt does require assistance to properly apply TLSO. demonstrated to mobility tech correct application in sitting + avoiding colostomy. Pts spouse was also present for education on proper application    Transfers Overall transfer level: Needs assistance Equipment used: Rolling walker (2 wheels) Transfers: Sit to/from Stand Sit to Stand: Min guard, Min assist    General transfer comment: CGA for slightly elevated bed height but min assist form recliner surface    Ambulation/Gait Ambulation/Gait assistance: Min guard Gait Distance (Feet): 200 Feet Assistive device: Rolling walker (2 wheels) Gait Pattern/deviations: Trunk flexed, Step-through pattern Gait velocity: decreased     General Gait Details: pt tolerated gait well but continues to require Vcs for posture correction and improved overall gait kinematics. id have two occasions of unsteadiness however no intervention required to correct/prevent fall.    Balance Overall balance assessment: Needs assistance Sitting-balance support: Feet supported, Bilateral upper extremity supported Sitting balance-Leahy Scale: Good     Standing balance support: Bilateral upper extremity supported, During functional activity, Reliant on assistive  device for balance, Single extremity supported Standing balance-Leahy  Scale: Fair Standing balance comment: heavy reliance on walker during all standing dynamic activity       Cognition Arousal/Alertness: Awake/alert Behavior During Therapy: WFL for tasks assessed/performed Overall Cognitive Status: Within Functional Limits for tasks assessed      General Comments: Pt is A and O x 4. Still slow moving with some anxiety noted however continues to improve. both pt and spouse do not feel they can safely manage at home due to level of assistance for getting in/out of bed and to stand.           General Comments General comments (skin integrity, edema, etc.): Mobility tech in room to start mobilitizing pt daily to amximize OOB/standing activity      Pertinent Vitals/Pain Pain Assessment Pain Assessment: 0-10 Pain Score: 5  Pain Location: back Pain Descriptors / Indicators: Aching Pain Intervention(s): Limited activity within patient's tolerance, Monitored during session, Premedicated before session, Repositioned     PT Goals (current goals can now be found in the care plan section) Acute Rehab PT Goals Patient Stated Goal: " move better with less pain." Progress towards PT goals: Progressing toward goals    Frequency    7X/week      PT Plan Current plan remains appropriate    Co-evaluation     PT goals addressed during session: Mobility/safety with mobility;Balance;Proper use of DME;Strengthening/ROM        AM-PAC PT "6 Clicks" Mobility   Outcome Measure  Help needed turning from your back to your side while in a flat bed without using bedrails?: A Little Help needed moving from lying on your back to sitting on the side of a flat bed without using bedrails?: A Little Help needed moving to and from a bed to a chair (including a wheelchair)?: A Little Help needed standing up from a chair using your arms (e.g., wheelchair or bedside chair)?: A Little Help needed to  walk in hospital room?: A Little Help needed climbing 3-5 steps with a railing? : A Little 6 Click Score: 18    End of Session   Activity Tolerance: Patient tolerated treatment well Patient left: in chair;with call bell/phone within reach;with chair alarm set;with family/visitor present Nurse Communication: Mobility status PT Visit Diagnosis: Muscle weakness (generalized) (M62.81);Pain;Difficulty in walking, not elsewhere classified (R26.2);Other abnormalities of gait and mobility (R26.89);Unsteadiness on feet (R26.81)     Time: 4098-1191 PT Time Calculation (min) (ACUTE ONLY): 26 min  Charges:  $Gait Training: 8-22 mins $Therapeutic Activity: 8-22 mins                     Jetta Lout PTA 05/20/22, 2:16 PM

## 2022-05-20 NOTE — Plan of Care (Signed)
  Problem: Education: Goal: Knowledge of General Education information will improve Description: Including pain rating scale, medication(s)/side effects and non-pharmacologic comfort measures Outcome: Progressing   Problem: Health Behavior/Discharge Planning: Goal: Ability to manage health-related needs will improve Outcome: Progressing   Problem: Clinical Measurements: Goal: Ability to maintain clinical measurements within normal limits will improve Outcome: Progressing Goal: Diagnostic test results will improve Outcome: Progressing Goal: Cardiovascular complication will be avoided Outcome: Progressing   Problem: Activity: Goal: Risk for activity intolerance will decrease Outcome: Progressing   Problem: Nutrition: Goal: Adequate nutrition will be maintained Outcome: Progressing   Problem: Coping: Goal: Level of anxiety will decrease Outcome: Progressing   Problem: Pain Managment: Goal: General experience of comfort will improve Outcome: Progressing   Problem: Safety: Goal: Ability to remain free from injury will improve Outcome: Progressing

## 2022-05-21 ENCOUNTER — Other Ambulatory Visit: Payer: Self-pay | Admitting: Family Medicine

## 2022-05-21 DIAGNOSIS — S32010A Wedge compression fracture of first lumbar vertebra, initial encounter for closed fracture: Secondary | ICD-10-CM | POA: Diagnosis not present

## 2022-05-21 LAB — CBC
HCT: 29.5 % — ABNORMAL LOW (ref 36.0–46.0)
Hemoglobin: 9.7 g/dL — ABNORMAL LOW (ref 12.0–15.0)
MCH: 31.8 pg (ref 26.0–34.0)
MCHC: 32.9 g/dL (ref 30.0–36.0)
MCV: 96.7 fL (ref 80.0–100.0)
Platelets: 238 10*3/uL (ref 150–400)
RBC: 3.05 MIL/uL — ABNORMAL LOW (ref 3.87–5.11)
RDW: 13.2 % (ref 11.5–15.5)
WBC: 13.2 10*3/uL — ABNORMAL HIGH (ref 4.0–10.5)
nRBC: 0.2 % (ref 0.0–0.2)

## 2022-05-21 LAB — BASIC METABOLIC PANEL
Anion gap: 9 (ref 5–15)
BUN: 25 mg/dL — ABNORMAL HIGH (ref 8–23)
CO2: 27 mmol/L (ref 22–32)
Calcium: 8.4 mg/dL — ABNORMAL LOW (ref 8.9–10.3)
Chloride: 101 mmol/L (ref 98–111)
Creatinine, Ser: 1.5 mg/dL — ABNORMAL HIGH (ref 0.44–1.00)
GFR, Estimated: 38 mL/min — ABNORMAL LOW (ref >60)
Glucose, Bld: 261 mg/dL — ABNORMAL HIGH (ref 70–99)
Potassium: 3.7 mmol/L (ref 3.5–5.1)
Sodium: 137 mmol/L (ref 135–145)

## 2022-05-21 LAB — GLUCOSE, CAPILLARY
Glucose-Capillary: 177 mg/dL — ABNORMAL HIGH (ref 70–99)
Glucose-Capillary: 304 mg/dL — ABNORMAL HIGH (ref 70–99)
Glucose-Capillary: 170 mg/dL — ABNORMAL HIGH (ref 70–99)
Glucose-Capillary: 251 mg/dL — ABNORMAL HIGH (ref 70–99)

## 2022-05-21 LAB — CULTURE, BLOOD (ROUTINE X 2): Culture: NO GROWTH

## 2022-05-21 LAB — MAGNESIUM: Magnesium: 1.3 mg/dL — ABNORMAL LOW (ref 1.7–2.4)

## 2022-05-21 MED ORDER — INSULIN ASPART 100 UNIT/ML IJ SOLN
5.0000 [IU] | Freq: Three times a day (TID) | INTRAMUSCULAR | Status: DC
  Administered 2022-05-21 – 2022-05-22 (×4): 5 [IU] via SUBCUTANEOUS
  Filled 2022-05-21 (×4): qty 1

## 2022-05-21 MED ORDER — MAGIC MOUTHWASH W/LIDOCAINE
5.0000 mL | Freq: Three times a day (TID) | ORAL | Status: AC
  Administered 2022-05-21 – 2022-05-23 (×9): 5 mL via ORAL
  Filled 2022-05-21 (×9): qty 5

## 2022-05-21 MED ORDER — MAGNESIUM SULFATE 4 GM/100ML IV SOLN
4.0000 g | Freq: Once | INTRAVENOUS | Status: DC
  Filled 2022-05-21: qty 100

## 2022-05-21 NOTE — Plan of Care (Signed)
  Problem: Education: Goal: Ability to describe self-care measures that may prevent or decrease complications (Diabetes Survival Skills Education) will improve Outcome: Progressing   Problem: Coping: Goal: Ability to adjust to condition or change in health will improve Outcome: Progressing   Problem: Skin Integrity: Goal: Risk for impaired skin integrity will decrease Outcome: Progressing   Problem: Activity: Goal: Risk for activity intolerance will decrease Outcome: Progressing   Problem: Safety: Goal: Ability to remain free from injury will improve Outcome: Progressing   Problem: Pain Managment: Goal: General experience of comfort will improve Outcome: Progressing   Problem: Elimination: Goal: Will not experience complications related to bowel motility Outcome: Progressing   Problem: Skin Integrity: Goal: Risk for impaired skin integrity will decrease Outcome: Progressing

## 2022-05-21 NOTE — Progress Notes (Signed)
Mobility Specialist - Progress Note   05/21/22 1344  Mobility  Activity Ambulated with assistance in hallway  Level of Assistance Standby assist, set-up cues, supervision of patient - no hands on  Assistive Device Front wheel walker  Distance Ambulated (ft) 240 ft  Activity Response Tolerated well  Mobility Referral Yes  $Mobility charge 1 Mobility   Pt sitting in recliner on RA upon arrival. Pt needs ModA to apply TLSO brace. Pt STS and ambulates in hallway SBA. Pt returns to recliner with needs in reach, TLSO brace removed, chair alarm activated, and family present.   Terrilyn Saver  Mobility Specialist  05/21/22 1:45 PM

## 2022-05-21 NOTE — Progress Notes (Signed)
[facility]  ROUNDING NOTE   Subjective:   Jamie [name] was admitted to [facility] on 05/10/2022 for Compression fracture of L1 lumbar vertebra (HCC) [S32.010A] Compression fracture of lumbar vertebra, unspecified lumbar vertebral level, initial encounter [facility]) [S32.000A]  Patient is followed by our practice outpatient by Dr Cherylann Ratel.   Patient seen resting in bed, currently eating breakfast Patient's husband is at bedside. Patient states she feels well today Currently has  Foley catheter with blood-tinged urine.  Good urine output. Serum creatinine has improved significantly Lost IV but oral intake appears to be adequate.  Objective:  Vital signs in last 24 hours:  Temp:  [97.9 F (36.6 C)-100 F (37.8 C)] 100 F (37.8 C) (04/28 0914) Pulse Rate:  [78-89] 89 (04/28 0914) Resp:  [18] 18 (04/28 0914) BP: (139-149)/(41-55) 143/46 (04/28 0914) SpO2:  [97 %-99 %] 99 % (04/28 0914) FiO2 (%):  [21 %] 21 % (04/28 0125)  Weight change:  Filed Weights   05/11/22 1628 05/12/22 0701  Weight: 120.2 kg 120.2 kg    Intake/Output: I/O last 3 completed shifts: In: 1546.8 [P.O.:600; I.V.:946.8] Out: 6050 [Urine:4500; Stool:1550]   Intake/Output this shift:  Total I/O In: 240 [P.O.:240] Out: 1425 [Urine:1375; Stool:50]  Physical Exam: General: NAD, resting in bed  Head: Normocephalic, atraumatic. Moist oral mucosal membranes  Eyes: Anicteric  Lungs:  Clear to auscultation  Heart: Regular rate and rhythm  Abdomen:  Soft, nontender  Extremities:  no peripheral edema.  Neurologic: Nonfocal, moving all four extremities  Skin: No lesions   GU Foley    Basic Metabolic Panel: Recent Labs  Lab 05/17/22 0614 05/18/22 0431 05/19/22 0542 05/20/22 0549 05/21/22 0520  NA 129* 127* 133* 142 137  K 4.1 3.5 3.2* 3.2* 3.7  CL 97* 90* 93* 103 101  CO2 20* 24 27 32 27  GLUCOSE 254* 259* 203* 175* 261*  BUN 82* 89* 73* 35* 25*  CREATININE 6.88* 7.52* 5.09* 1.64* 1.50*   CALCIUM 8.1* 7.7* 7.7* 8.4* 8.4*  MG  --  2.2 1.9 1.7 1.3*     Liver Function Tests: No results for input(s): "AST", "ALT", "ALKPHOS", "BILITOT", "PROT", "ALBUMIN" in the last 168 hours. No results for input(s): "LIPASE", "AMYLASE" in the last 168 hours. No results for input(s): "AMMONIA" in the last 168 hours.  CBC: Recent Labs  Lab 05/17/22 0614 05/18/22 0431 05/19/22 0010 05/19/22 0542 05/20/22 0549 05/21/22 0520  WBC 15.8* 14.4*  --  11.3* 8.8 13.2*  HGB 11.5* 10.7* 10.0* 9.4* 9.7* 9.7*  HCT 34.2* 31.4* 29.6* 28.2* 29.6* 29.5*  MCV 94.7 93.5  --  93.4 96.1 96.7  PLT 235 228  --  206 226 238     Cardiac Enzymes: No results for input(s): "CKTOTAL", "CKMB", "CKMBINDEX", "TROPONINI" in the last 168 hours.  BNP: Invalid input(s): "POCBNP"  CBG: Recent Labs  Lab 05/20/22 0746 05/20/22 1151 05/20/22 1626 05/20/22 2129 05/21/22 0809  GLUCAP 215* 258* 215* 168* 177*     Microbiology: Results for orders placed or performed during the hospital encounter of 05/10/22  Culture, blood (Routine X 2) w Reflex to ID Panel     Status: None (Preliminary result)   Collection Time: 05/18/22  2:12 PM   Specimen: BLOOD  Result Value Ref Range Status   Specimen Description BLOOD BLOOD LEFT FOREARM  Final   Special Requests   Final    BOTTLES DRAWN AEROBIC AND ANAEROBIC Blood Culture adequate volume   Culture   Final  NO GROWTH 3 DAYS Performed at [facility], [address] [place], [place] [address]    Report Status PENDING  Incomplete  Culture, blood (Routine X 2) w Reflex to ID Panel     Status: None (Preliminary result)   Collection Time: 05/18/22  2:13 PM   Specimen: BLOOD  Result Value Ref Range Status   Specimen Description BLOOD RIGHT ANTECUBITAL  Final   Special Requests   Final    BOTTLES DRAWN AEROBIC AND ANAEROBIC Blood Culture adequate volume   Culture   Final    NO GROWTH 3 DAYS Performed at [facility], [address]  [place], [place] [address]    Report Status PENDING  Incomplete    Coagulation Studies: No results for input(s): "LABPROT", "INR" in the last 72 hours.  Urinalysis: Recent Labs    05/18/22 1725  COLORURINE YELLOW*  LABSPEC 1.010  PHURINE 5.0  GLUCOSEU NEGATIVE  HGBUR MODERATE*  BILIRUBINUR NEGATIVE  KETONESUR NEGATIVE  PROTEINUR NEGATIVE  NITRITE NEGATIVE  LEUKOCYTESUR TRACE*       Imaging: No results found.   Medications:    sodium chloride     magnesium sulfate bolus IVPB      atorvastatin  5 mg Oral QODAY   cholecalciferol  1,000 Units Oral Daily   fluticasone  2 spray Each Nare QHS   insulin aspart  0-9 Units Subcutaneous TID WC   insulin aspart  3 Units Subcutaneous TID WC   insulin glargine-yfgn  18 Units Subcutaneous QHS   lidocaine  1 patch Transdermal Q24H   losartan  25 mg Oral Daily   magic mouthwash w/lidocaine  5 mL Oral TID   metoprolol tartrate  25 mg Oral Daily   multivitamin with minerals  1 tablet Oral Daily   sodium chloride flush  3 mL Intravenous Q12H   acetaminophen, albuterol, dextromethorphan-guaiFENesin, hydrALAZINE, melatonin, menthol-cetylpyridinium **OR** phenol, methocarbamol, mirabegron ER, ondansetron (ZOFRAN) IV, oxyCODONE-acetaminophen, simethicone, sodium chloride flush  Assessment/ Plan:  Jamie [name] is a 67 y.o. white female with diabetes mellitus type II noninsulin dependent, hypertension, nephrolithiasis, sleep apnea, GERD, hyperlipidemia, who is admitted to [facility] on 05/10/2022 for Compression fracture of L1 lumbar vertebra (HCC) [S32.010A] Compression fracture of lumbar vertebra, unspecified lumbar vertebral level, initial encounter (HCC) [S32.000A]  Acute kidney injury  - AKI due to urinary retention.   Gross hematuria after foley placement  Creatinine and UOP Improved significantly after Foley placement.   -Will need ongoing urology follow-up.  chronic kidney disease stage IIIB with proteinuria secondary to diabetic  nephropathy and hypertension. Baseline creatinine of 1.32, GFR of 44 on 04/04/22. No IV contrast exposure.  - Renal ultrasound shows mild bilateral hydronephrosis and 8mm nonobstructing left renal calculus. Follows Urology outpatient.     Hypertension with chronic kidney disease:   Elevated BPs are noted.  Home regimen of losartan and metoprolol. -both metoprolol and losartan can be restarted.   Diabetes mellitus type II with chronic kidney disease: noninsulin dependent. Hemoglobin A1c of 6.4% on 01/31/22. Placed on sliding scale insulin. Not on an SGLT-2 inhibitor as outpatient due to history of acute kidney injury to empagliflozin.  Glucose remains elevated. Primary team to manage SSI   LOS: 10 Jalessa Peyser 4/28/202411:34 AM

## 2022-05-21 NOTE — Progress Notes (Signed)
Physical Therapy Treatment Patient Details Name: Jamie [name] MRN: [id number] DOB: 11/09/1955 Today's Date: 05/21/2022   History of Present Illness Pt is a 67 y/o F admitted on 05/10/22 after presenting with c/o a fall resulting in lower back pain. Pt found to have compression fx of L1 lumbar vertebra. Neurosurgery was consulted & pt underwent posterior thoracic fusion T11-L3 on 05/12/22. PMH: HTN, HLD, DM, CKD3a, OSA on CPAP, morbid obesity, anemia, gallstone, kidney stone, ulcerative colitis s/p colectomy & ileostomy, peripheral neuropathy    PT Comments    Pt ready for session.  Increased tie but she is able to get to EOB with min a x 1 mostly for upper body.  Does use bed features such as rails and HOB raised.  Discussed likely need for bedrail at home and encouraged them to look at options that they think will work with their sleep number adjustable bed at home.  May need a tension rod type pole given adjustment and frame type.  She is able to stand from slightly elevated bed and walk x 1 lap+ on unit with slow but generally steady gait.  Remained in recliner after session with needs met.     Recommendations for follow up therapy are one component of a multi-disciplinary discharge planning process, led by the attending physician.  Recommendations may be updated based on patient status, additional functional criteria and insurance authorization.  Follow Up Recommendations       Assistance Recommended at Discharge Intermittent Supervision/Assistance  Patient can return home with the following A little help with walking and/or transfers;A little help with bathing/dressing/bathroom;Assistance with cooking/housework;Direct supervision/assist for medications management;Direct supervision/assist for financial management;Assist for transportation;Help with stairs or ramp for entrance   Equipment Recommendations  Other (comment) (Defer to next level of care)    Recommendations for Other Services        Precautions / Restrictions Precautions Precautions: Fall;Back Precaution Booklet Issued: Yes (comment) Required Braces or Orthoses: Spinal Brace Cervical Brace: For comfort Spinal Brace: Thoracolumbosacral orthotic;Applied in sitting position Restrictions Weight Bearing Restrictions: No     Mobility  Bed Mobility Overal bed mobility: Needs Assistance Bed Mobility: Supine to Sit     Supine to sit: Min assist     General bed mobility comments: increased time but still needs help for upper and some lower body movements.  encouraged pt and husband to look into bed rails for home that would work on their sleep number adjustable bed.  may need a sit to stand tension mounted rail for home given bedframe.    Transfers Overall transfer level: Needs assistance Equipment used: Rolling walker (2 wheels) Transfers: Sit to/from Stand Sit to Stand: Min guard, From elevated surface           General transfer comment: pulls up on walker vs pushing on bed but does well    Ambulation/Gait Ambulation/Gait assistance: Min guard Gait Distance (Feet): 200 Feet Assistive device: Rolling walker (2 wheels) Gait Pattern/deviations: Trunk flexed, Step-through pattern Gait velocity: decreased         Stairs             Wheelchair Mobility    Modified Rankin (Stroke Patients Only)       Balance Overall balance assessment: Needs assistance Sitting-balance support: Feet supported, Bilateral upper extremity supported Sitting balance-Leahy Scale: Good     Standing balance support: Bilateral upper extremity supported, During functional activity, Reliant on assistive device for balance, Single extremity supported Standing balance-Leahy Scale: Fair Standing balance comment:  heavy reliance on walker during all standing dynamic activity                            Cognition Arousal/Alertness: Awake/alert Behavior During Therapy: WFL for tasks  assessed/performed Overall Cognitive Status: Within Functional Limits for tasks assessed                                          Exercises      General Comments        Pertinent Vitals/Pain Pain Assessment Pain Assessment: Faces Faces Pain Scale: Hurts a little bit Pain Location: back Pain Descriptors / Indicators: Aching Pain Intervention(s): Limited activity within patient's tolerance, Monitored during session, Repositioned    Home Living                          Prior Function            PT Goals (current goals can now be found in the care plan section) Progress towards PT goals: Progressing toward goals    Frequency    7X/week      PT Plan Current plan remains appropriate    Co-evaluation              AM-PAC PT "6 Clicks" Mobility   Outcome Measure  Help needed turning from your back to your side while in a flat bed without using bedrails?: A Little Help needed moving from lying on your back to sitting on the side of a flat bed without using bedrails?: A Little Help needed moving to and from a bed to a chair (including a wheelchair)?: A Little Help needed standing up from a chair using your arms (e.g., wheelchair or bedside chair)?: A Little Help needed to walk in hospital room?: A Little Help needed climbing 3-5 steps with a railing? : A Little 6 Click Score: 18    End of Session   Activity Tolerance: Patient tolerated treatment well Patient left: in chair;with call bell/phone within reach;with chair alarm set;with family/visitor present Nurse Communication: Mobility status PT Visit Diagnosis: Muscle weakness (generalized) (M62.81);Pain;Difficulty in walking, not elsewhere classified (R26.2);Other abnormalities of gait and mobility (R26.89);Unsteadiness on feet (R26.81)     Time: 7253-6644 PT Time Calculation (min) (ACUTE ONLY): 25 min  Charges:  $Gait Training: 23-37 mins                   Danielle Dess,  PTA 05/21/22, 1:20 PM

## 2022-05-21 NOTE — Progress Notes (Signed)
PROGRESS NOTE    Jamie [name]  ZOX:[id number] DOB: June 25, 1955 DOA: 05/10/2022 PCP: Jamie Pimple, MD  145A/145A-AA  LOS: 10 days   Brief hospital course:   Assessment & Plan: Jamie [name] is a 67 y.o. female with medical history significant of HTN, HLD, DM, CKD-3a, OSA on CPAP, morbid obesity, anemia, gallstone, kidney stone, ulcerative colitis (s/p of colectomy and s/p ileostomy), who presents with fall and lower back pain.    Pt states that she accidentally fell in church yesterday evening, landing on her back, and possibly hit her head.   Compression fracture of L1 lumbar vertebra:  s/p open reduction internal fixation of T12 & L1 fractures & T11-L3 posterior arthrodesis as per neuro surg.  Continue w/ lidocaine patch. Percocet prn for pain.  Continue Brace while OOB for 6 weeks. --Will need outpatient f/u w/ neuro surg, Dr. Myer [name].  --SNF rehab  AKI 2/2 urinary retention  CKDIIIa Cr peaked at 7.52.  Bladder scan 4/25 found >982ml post void.  Foley placed.  Cr improved after bladder decompression. Plan: --cont Foley for bladder decompression.  Will discharge with Foley. --oral hydration now --hold losartan  Hematuria --likely from bladder trauma from over-distention --urology consult tomorrow --hold chem DVT ppx.   Acute metabolic acidosis, resolved S/p bicarb gtt  Fall:  at home. Fall precautions    HTN:  --cont Lopressor --hold losartan 2/2 AKI   Ulcerative colitis: s/p colectomy and ileostomy.  No acute issues    DM2: HbA1c 6.4, well controlled.  Continue on SSI w/ accuchecks  --cont glargine to 18u nightly --increase mealtime to 5u TID   HLD:  continue on statin    Leukocytosis:  likely reactive.    OSA:  continue on BiPAP    Morbid obesity: BMI 43.4.  Complicates overall care & prognosis     DVT prophylaxis: Heparin SQ Code Status: Full code  Family Communication: husband updated at bedside today Level of care: Med-Surg Dispo:    The patient is from: home Anticipated d/c is to: SNF rehab Anticipated d/c date is: medically ready in 1-2 days   Subjective and Interval History:  Pt reported not feeling as well this morning.  Temp up to 100.  Still having gross hematuria.  Pt lost her IV, difficult stick, per nephro, ok to stop MIVF.   Objective: Vitals:   05/20/22 1702 05/21/22 0111 05/21/22 0914 05/21/22 1520  BP: (!) 139/55 (!) 149/41 (!) 143/46 (!) 124/34  Pulse: 80 78 89 79  Resp: 18 18 18 18   Temp: 97.9 F (36.6 C) 98.8 F (37.1 C) 100 F (37.8 C) 98.4 F (36.9 C)  TempSrc:   Oral   SpO2: 99% 97% 99% 99%  Weight:      Height:        Intake/Output Summary (Last 24 hours) at 05/21/2022 1530 Last data filed at 05/21/2022 1436 Gross per 24 hour  Intake 720 ml  Output 4000 ml  Net -3280 ml   Filed Weights   05/11/22 1628 05/12/22 0701  Weight: 120.2 kg 120.2 kg    Examination:   Constitutional: NAD, AAOx3 HEENT: conjunctivae and lids normal, EOMI CV: No cyanosis.   RESP: normal respiratory effort, on RA Neuro: II - XII grossly intact.   Psych: Normal mood and affect.  Appropriate judgement and reason  Foley present with red urine   Data Reviewed: I have personally reviewed labs and imaging studies  Time spent: 35 minutes  Jamie Priestly, MD [facility] If  7PM-7AM, please contact night-coverage 05/21/2022, 3:30 PM

## 2022-05-21 NOTE — Plan of Care (Signed)
  Problem: Coping: Goal: Ability to adjust to condition or change in health will improve Outcome: Progressing   Problem: Fluid Volume: Goal: Ability to maintain a balanced intake and output will improve Outcome: Progressing   Problem: Metabolic: Goal: Ability to maintain appropriate glucose levels will improve Outcome: Progressing   Problem: Nutritional: Goal: Maintenance of adequate nutrition will improve Outcome: Progressing   Problem: Skin Integrity: Goal: Risk for impaired skin integrity will decrease Outcome: Progressing   Problem: Tissue Perfusion: Goal: Adequacy of tissue perfusion will improve Outcome: Progressing   Problem: Education: Goal: Knowledge of General Education information will improve Description: Including pain rating scale, medication(s)/side effects and non-pharmacologic comfort measures Outcome: Progressing   Problem: Health Behavior/Discharge Planning: Goal: Ability to manage health-related needs will improve Outcome: Progressing   Problem: Clinical Measurements: Goal: Ability to maintain clinical measurements within normal limits will improve Outcome: Progressing   Problem: Coping: Goal: Level of anxiety will decrease Outcome: Progressing   Problem: Elimination: Goal: Will not experience complications related to bowel motility Outcome: Progressing   Problem: Safety: Goal: Ability to remain free from injury will improve Outcome: Progressing

## 2022-05-22 DIAGNOSIS — R339 Retention of urine, unspecified: Secondary | ICD-10-CM

## 2022-05-22 DIAGNOSIS — R310 Gross hematuria: Secondary | ICD-10-CM | POA: Diagnosis not present

## 2022-05-22 DIAGNOSIS — S32010A Wedge compression fracture of first lumbar vertebra, initial encounter for closed fracture: Secondary | ICD-10-CM | POA: Diagnosis not present

## 2022-05-22 DIAGNOSIS — N179 Acute kidney failure, unspecified: Secondary | ICD-10-CM

## 2022-05-22 DIAGNOSIS — N9989 Other postprocedural complications and disorders of genitourinary system: Secondary | ICD-10-CM | POA: Diagnosis not present

## 2022-05-22 LAB — CULTURE, BLOOD (ROUTINE X 2): Special Requests: ADEQUATE

## 2022-05-22 LAB — GLUCOSE, CAPILLARY
Glucose-Capillary: 143 mg/dL — ABNORMAL HIGH (ref 70–99)
Glucose-Capillary: 284 mg/dL — ABNORMAL HIGH (ref 70–99)
Glucose-Capillary: 186 mg/dL — ABNORMAL HIGH (ref 70–99)
Glucose-Capillary: 159 mg/dL — ABNORMAL HIGH (ref 70–99)

## 2022-05-22 LAB — CBC
HCT: 28.3 % — ABNORMAL LOW (ref 36.0–46.0)
Hemoglobin: 9.2 g/dL — ABNORMAL LOW (ref 12.0–15.0)
MCH: 31.8 pg (ref 26.0–34.0)
MCHC: 32.5 g/dL (ref 30.0–36.0)
MCV: 97.9 fL (ref 80.0–100.0)
Platelets: 265 10*3/uL (ref 150–400)
RBC: 2.89 MIL/uL — ABNORMAL LOW (ref 3.87–5.11)
RDW: 13.4 % (ref 11.5–15.5)
WBC: 16.7 10*3/uL — ABNORMAL HIGH (ref 4.0–10.5)
nRBC: 0.1 % (ref 0.0–0.2)

## 2022-05-22 LAB — BASIC METABOLIC PANEL
Anion gap: 6 (ref 5–15)
BUN: 18 mg/dL (ref 8–23)
CO2: 30 mmol/L (ref 22–32)
Calcium: 8.2 mg/dL — ABNORMAL LOW (ref 8.9–10.3)
Chloride: 102 mmol/L (ref 98–111)
Creatinine, Ser: 1.32 mg/dL — ABNORMAL HIGH (ref 0.44–1.00)
GFR, Estimated: 44 mL/min — ABNORMAL LOW (ref >60)
Glucose, Bld: 150 mg/dL — ABNORMAL HIGH (ref 70–99)
Potassium: 3.2 mmol/L — ABNORMAL LOW (ref 3.5–5.1)
Sodium: 138 mmol/L (ref 135–145)

## 2022-05-22 LAB — MAGNESIUM: Magnesium: 1.4 mg/dL — ABNORMAL LOW (ref 1.7–2.4)

## 2022-05-22 MED ORDER — INSULIN ASPART 100 UNIT/ML IJ SOLN
7.0000 [IU] | Freq: Three times a day (TID) | INTRAMUSCULAR | Status: DC
  Administered 2022-05-23 – 2022-05-24 (×4): 7 [IU] via SUBCUTANEOUS
  Filled 2022-05-22 (×3): qty 1

## 2022-05-22 MED ORDER — MAGNESIUM OXIDE -MG SUPPLEMENT 400 (240 MG) MG PO TABS
800.0000 mg | ORAL_TABLET | Freq: Two times a day (BID) | ORAL | Status: DC
  Administered 2022-05-22 – 2022-05-24 (×5): 800 mg via ORAL
  Filled 2022-05-22 (×5): qty 2

## 2022-05-22 MED ORDER — CHLORHEXIDINE GLUCONATE CLOTH 2 % EX PADS
6.0000 | MEDICATED_PAD | Freq: Every day | CUTANEOUS | Status: DC
  Administered 2022-05-22 – 2022-05-24 (×3): 6 via TOPICAL

## 2022-05-22 MED ORDER — POTASSIUM CHLORIDE CRYS ER 20 MEQ PO TBCR
40.0000 meq | EXTENDED_RELEASE_TABLET | Freq: Once | ORAL | Status: AC
  Administered 2022-05-22: 40 meq via ORAL
  Filled 2022-05-22: qty 2

## 2022-05-22 NOTE — Plan of Care (Signed)

## 2022-05-22 NOTE — Progress Notes (Signed)
Physical Therapy Treatment Patient Details Name: Jamie [name] MRN: [id number] DOB: 10/14/1955 Today's Date: 05/22/2022   History of Present Illness Pt is a 67 y/o F admitted on 05/10/22 after presenting with c/o a fall resulting in lower back pain. Pt found to have compression fx of L1 lumbar vertebra. Neurosurgery was consulted & pt underwent posterior thoracic fusion T11-L3 on 05/12/22. PMH: HTN, HLD, DM, CKD3a, OSA on CPAP, morbid obesity, anemia, gallstone, kidney stone, ulcerative colitis s/p colectomy & ileostomy, peripheral neuropathy    PT Comments    Pt able to don brace with min a x 1 and some cues.  Doffs without assist.  She is able to stand and progress gait x 2 laps today with RW and min guard.  Overall less dependence on RW and progressing well.  She seems more confident with mobility.     Recommendations for follow up therapy are one component of a multi-disciplinary discharge planning process, led by the attending physician.  Recommendations may be updated based on patient status, additional functional criteria and insurance authorization.  Follow Up Recommendations       Assistance Recommended at Discharge Intermittent Supervision/Assistance  Patient can return home with the following A little help with walking and/or transfers;A little help with bathing/dressing/bathroom;Assistance with cooking/housework;Direct supervision/assist for medications management;Direct supervision/assist for financial management;Assist for transportation;Help with stairs or ramp for entrance   Equipment Recommendations  Other (comment) (Defer to next level of care)    Recommendations for Other Services       Precautions / Restrictions Precautions Precautions: Fall;Back Required Braces or Orthoses: Spinal Brace Cervical Brace: For comfort Spinal Brace: Thoracolumbosacral orthotic;Applied in sitting position Restrictions Weight Bearing Restrictions: No     Mobility  Bed Mobility                General bed mobility comments: in recliner before and after session Patient Response: Cooperative  Transfers Overall transfer level: Needs assistance Equipment used: Rolling walker (2 wheels) Transfers: Sit to/from Stand Sit to Stand: Min guard                Ambulation/Gait Ambulation/Gait assistance: Land (Feet): 360 Feet Assistive device: Rolling walker (2 wheels) Gait Pattern/deviations: Trunk flexed, Step-through pattern Gait velocity: decreased     General Gait Details: increased to x 2 laps on unit with RW and steady gait   Stairs             Wheelchair Mobility    Modified Rankin (Stroke Patients Only)       Balance Overall balance assessment: Needs assistance Sitting-balance support: Bilateral upper extremity supported, No upper extremity supported Sitting balance-Leahy Scale: Good     Standing balance support: Bilateral upper extremity supported, During functional activity Standing balance-Leahy Scale: Good Standing balance comment: decreasing reliance on RW for support                            Cognition Arousal/Alertness: Awake/alert Behavior During Therapy: WFL for tasks assessed/performed Overall Cognitive Status: Within Functional Limits for tasks assessed                                          Exercises      General Comments        Pertinent Vitals/Pain Pain Assessment Pain Assessment: Faces Faces Pain Scale: Hurts a little  bit Pain Location: back Pain Descriptors / Indicators: Aching Pain Intervention(s): Monitored during session, Repositioned, Limited activity within patient's tolerance    Home Living                          Prior Function            PT Goals (current goals can now be found in the care plan section) Progress towards PT goals: Progressing toward goals    Frequency    7X/week      PT Plan Current plan remains  appropriate    Co-evaluation              AM-PAC PT "6 Clicks" Mobility   Outcome Measure  Help needed turning from your back to your side while in a flat bed without using bedrails?: A Little Help needed moving from lying on your back to sitting on the side of a flat bed without using bedrails?: A Lot Help needed moving to and from a bed to a chair (including a wheelchair)?: A Little Help needed standing up from a chair using your arms (e.g., wheelchair or bedside chair)?: A Little Help needed to walk in hospital room?: A Little Help needed climbing 3-5 steps with a railing? : A Little 6 Click Score: 17    End of Session   Activity Tolerance: Patient tolerated treatment well Patient left: in chair;with call bell/phone within reach;with chair alarm set;with family/visitor present Nurse Communication: Mobility status PT Visit Diagnosis: Muscle weakness (generalized) (M62.81);Pain;Difficulty in walking, not elsewhere classified (R26.2);Other abnormalities of gait and mobility (R26.89);Unsteadiness on feet (R26.81)     Time: 9604-5409 PT Time Calculation (min) (ACUTE ONLY): 17 min  Charges:  $Gait Training: 8-22 mins                   Danielle Dess, PTA 05/22/22, 1:03 PM

## 2022-05-22 NOTE — Progress Notes (Signed)
[facility]  ROUNDING NOTE   Subjective:   Jamie [name] was admitted to [facility] on 05/10/2022 for Compression fracture of L1 lumbar vertebra (HCC) [S32.010A] Compression fracture of lumbar vertebra, unspecified lumbar vertebral level, initial encounter [facility]) [S32.000A]  Patient is followed by our practice outpatient by Dr Cherylann Ratel.   Patient seen sitting up in chair Remains on room air States she feels well Denies pain or discomfort   Objective:  Vital signs in last 24 hours:  Temp:  [98.3 F (36.8 C)-98.9 F (37.2 C)] 98.9 F (37.2 C) (04/29 0828) Pulse Rate:  [79-83] 81 (04/29 0828) Resp:  [17-18] 18 (04/29 0828) BP: (124-168)/(34-52) 168/47 (04/29 0828) SpO2:  [98 %-100 %] 100 % (04/29 0828)  Weight change:  Filed Weights   05/11/22 1628 05/12/22 0701  Weight: 120.2 kg 120.2 kg    Intake/Output: I/O last 3 completed shifts: In: 960 [P.O.:960] Out: 4600 [Urine:3300; Stool:1300]   Intake/Output this shift:  Total I/O In: 360 [P.O.:360] Out: 1400 [Urine:1400]  Physical Exam: General: NAD, resting in bed  Head: Normocephalic, atraumatic. Moist oral mucosal membranes  Eyes: Anicteric  Lungs:  Clear to auscultation  Heart: Regular rate and rhythm  Abdomen:  Soft, nontender  Extremities:  no peripheral edema.  Neurologic: Nonfocal, moving all four extremities  Skin: No lesions   GU Foley-pink    Basic Metabolic Panel: Recent Labs  Lab 05/18/22 0431 05/19/22 0542 05/20/22 0549 05/21/22 0520 05/22/22 0350  NA 127* 133* 142 137 138  K 3.5 3.2* 3.2* 3.7 3.2*  CL 90* 93* 103 101 102  CO2 24 27 32 27 30  GLUCOSE 259* 203* 175* 261* 150*  BUN 89* 73* 35* 25* 18  CREATININE 7.52* 5.09* 1.64* 1.50* 1.32*  CALCIUM 7.7* 7.7* 8.4* 8.4* 8.2*  MG 2.2 1.9 1.7 1.3* 1.4*     Liver Function Tests: No results for input(s): "AST", "ALT", "ALKPHOS", "BILITOT", "PROT", "ALBUMIN" in the last 168 hours. No results for input(s): "LIPASE", "AMYLASE" in  the last 168 hours. No results for input(s): "AMMONIA" in the last 168 hours.  CBC: Recent Labs  Lab 05/18/22 0431 05/19/22 0010 05/19/22 0542 05/20/22 0549 05/21/22 0520 05/22/22 0350  WBC 14.4*  --  11.3* 8.8 13.2* 16.7*  HGB 10.7* 10.0* 9.4* 9.7* 9.7* 9.2*  HCT 31.4* 29.6* 28.2* 29.6* 29.5* 28.3*  MCV 93.5  --  93.4 96.1 96.7 97.9  PLT 228  --  206 226 238 265     Cardiac Enzymes: No results for input(s): "CKTOTAL", "CKMB", "CKMBINDEX", "TROPONINI" in the last 168 hours.  BNP: Invalid input(s): "POCBNP"  CBG: Recent Labs  Lab 05/21/22 1152 05/21/22 1652 05/21/22 2120 05/22/22 0827 05/22/22 1202  GLUCAP 304* 170* 251* 143* 284*     Microbiology: Results for orders placed or performed during the hospital encounter of 05/10/22  Culture, blood (Routine X 2) w Reflex to ID Panel     Status: None (Preliminary result)   Collection Time: 05/18/22  2:12 PM   Specimen: BLOOD  Result Value Ref Range Status   Specimen Description BLOOD BLOOD LEFT FOREARM  Final   Special Requests   Final    BOTTLES DRAWN AEROBIC AND ANAEROBIC Blood Culture adequate volume   Culture   Final    NO GROWTH 4 DAYS Performed at [facility], [address] [place], [place] [address]    Report Status PENDING  Incomplete  Culture, blood (Routine X 2) w Reflex to ID Panel  Status: None (Preliminary result)   Collection Time: 05/18/22  2:13 PM   Specimen: BLOOD  Result Value Ref Range Status   Specimen Description BLOOD RIGHT ANTECUBITAL  Final   Special Requests   Final    BOTTLES DRAWN AEROBIC AND ANAEROBIC Blood Culture adequate volume   Culture   Final    NO GROWTH 4 DAYS Performed at [facility], [address], [place], [place] [address]    Report Status PENDING  Incomplete    Coagulation Studies: No results for input(s): "LABPROT", "INR" in the last 72 hours.  Urinalysis: No results for input(s): "COLORURINE", "LABSPEC", "PHURINE", "GLUCOSEU",  "HGBUR", "BILIRUBINUR", "KETONESUR", "PROTEINUR", "UROBILINOGEN", "NITRITE", "LEUKOCYTESUR" in the last 72 hours.  Invalid input(s): "APPERANCEUR"     Imaging: No results found.   Medications:    sodium chloride     magnesium sulfate bolus IVPB      atorvastatin  5 mg Oral QODAY   Chlorhexidine Gluconate Cloth  6 each Topical Q0600   cholecalciferol  1,000 Units Oral Daily   fluticasone  2 spray Each Nare QHS   insulin aspart  0-9 Units Subcutaneous TID WC   insulin aspart  5 Units Subcutaneous TID WC   insulin glargine-yfgn  18 Units Subcutaneous QHS   losartan  25 mg Oral Daily   magic mouthwash w/lidocaine  5 mL Oral TID   magnesium oxide  800 mg Oral BID   metoprolol tartrate  25 mg Oral Daily   multivitamin with minerals  1 tablet Oral Daily   sodium chloride flush  3 mL Intravenous Q12H   acetaminophen, albuterol, dextromethorphan-guaiFENesin, hydrALAZINE, melatonin, menthol-cetylpyridinium **OR** phenol, methocarbamol, mirabegron ER, ondansetron (ZOFRAN) IV, oxyCODONE-acetaminophen, simethicone, sodium chloride flush  Assessment/ Plan:  Jamie [name] is a 67 y.o. white female with diabetes mellitus type II noninsulin dependent, hypertension, nephrolithiasis, sleep apnea, GERD, hyperlipidemia, who is admitted to [facility] on 05/10/2022 for Compression fracture of L1 lumbar vertebra (HCC) [S32.010A] Compression fracture of lumbar vertebra, unspecified lumbar vertebral level, initial encounter (HCC) [S32.000A]  Acute kidney injury  - AKI due to urinary retention.   Gross hematuria after foley placement  Creatinine has returned to baseline.  -Urology following  chronic kidney disease stage IIIB with proteinuria secondary to diabetic nephropathy and hypertension. Baseline creatinine of 1.32, GFR of 44 on 04/04/22. No IV contrast exposure.  - Renal ultrasound shows mild bilateral hydronephrosis and 8mm nonobstructing left renal calculus. Follows Urology outpatient.      Hypertension with chronic kidney disease:   Blood pressure 168/47 Home regimen of losartan and metoprolol. -Metoprolol and losartan ordered.   Diabetes mellitus type II with chronic kidney disease: noninsulin dependent. Hemoglobin A1c of 6.4% on 01/31/22. Placed on sliding scale insulin. Not on an SGLT-2 inhibitor as outpatient due to history of acute kidney injury to empagliflozin.  Glucose elevated at times. Primary team to manage SSI   LOS: 11   4/29/20242:23 PM

## 2022-05-22 NOTE — Progress Notes (Signed)
PROGRESS NOTE    VIVA GALLAHER  XWR:[id number] DOB: [birth year] DOA: 05/10/2022 PCP: Judy Pimple, MD  145A/145A-AA  LOS: 11 days   Brief hospital course:   Assessment & Plan: LATANYA HEMMER is a 67 y.o. female with medical history significant of HTN, HLD, DM, CKD-3a, OSA on CPAP, morbid obesity, anemia, gallstone, kidney stone, ulcerative colitis (s/p of colectomy and s/p ileostomy), who presents with fall and lower back pain.    Pt states that she accidentally fell in church yesterday evening, landing on her back, and possibly hit her head.   Compression fracture of L1 lumbar vertebra:  s/p open reduction internal fixation of T12 & L1 fractures & T11-L3 posterior arthrodesis as per neuro surg.  Continue w/ lidocaine patch. Percocet prn for pain.  Continue Brace while OOB for 6 weeks. --Will need outpatient f/u w/ neuro surg, Dr. Myer Haff.  --SNF rehab  AKI 2/2 urinary retention  CKDIIIa Cr peaked at 7.52.  Bladder scan 4/25 found >945ml post void.  Foley placed.  Cr improved after bladder decompression. Plan: --cont Foley for bladder decompression.  --Will discharge with Foley, Outpatient voiding trial in 1 to 2 weeks with urology. --oral hydration --hold losartan  Hematuria --likely from bladder trauma from over-distention Plan: --urology consult today --obtain urine cx --Outpatient cystoscopy in about 1 month  --hold chem DVT ppx.   Acute metabolic acidosis, resolved S/p bicarb gtt  Fall:  at home. Fall precautions    HTN:  --cont Lopressor --hold losartan 2/2 AKI   Ulcerative colitis: s/p colectomy and ileostomy.  No acute issues    DM2: HbA1c 6.4, well controlled.  Continue on SSI w/ accuchecks  --cont glargine to 18u nightly --increase mealtime to 7u TID   HLD:  continue on statin    Leukocytosis:  likely reactive.    OSA:  continue on BiPAP    Morbid obesity: BMI 43.4.  Complicates overall care & prognosis     DVT prophylaxis: Heparin  SQ Code Status: Full code  Family Communication: husband updated at bedside today Level of care: Med-Surg Dispo:   The patient is from: home Anticipated d/c is to: SNF rehab Anticipated d/c date is: medically ready in 1-2 days   Subjective and Interval History:  No new complaints today.  Still having gross hematuria.   Objective: Vitals:   05/21/22 1520 05/21/22 2144 05/22/22 0828 05/22/22 1616  BP: (!) 124/34 (!) 151/52 (!) 168/47 (!) 146/47  Pulse: 79 83 81 74  Resp: 18 17 18 17   Temp: 98.4 F (36.9 C) 98.3 F (36.8 C) 98.9 F (37.2 C) 98.4 F (36.9 C)  TempSrc:      SpO2: 99% 98% 100% 99%  Weight:      Height:        Intake/Output Summary (Last 24 hours) at 05/22/2022 1918 Last data filed at 05/22/2022 1855 Gross per 24 hour  Intake 360 ml  Output 4500 ml  Net -4140 ml   Filed Weights   05/11/22 1628 05/12/22 0701  Weight: 120.2 kg 120.2 kg    Examination:   Constitutional: NAD, AAOx3 HEENT: conjunctivae and lids normal, EOMI CV: No cyanosis.   RESP: normal respiratory effort, on RA Neuro: II - XII grossly intact.   Psych: Normal mood and affect.  Appropriate judgement and reason  Foley present with red urine   Data Reviewed: I have personally reviewed labs and imaging studies  Time spent: 35 minutes  Darlin Priestly, MD [facility] If 7PM-7AM, please  contact night-coverage 05/22/2022, 7:18 PM

## 2022-05-22 NOTE — Care Management Important Message (Signed)
Important Message  Patient Details  Name: Jamie [name] MRN: [id number] Date of Birth: October 04, 1955   Medicare Important Message Given:  Yes     Johnell Comings 05/22/2022, 10:54 AM

## 2022-05-22 NOTE — TOC Progression Note (Signed)
Transition of Care [facility]) - Progression Note    Patient Details  Name: Jamie [name] MRN: [id number] Date of Birth: 11/13/1955  Transition of Care [facility]) CM/SW Contact  Marlowe Sax, RN Phone Number: 05/22/2022, 9:41 AM  Clinical Narrative:    Myriam Forehand to inquire the status of the auth, Member ID [id number] Approved to go to [facility] auth ID [id number]   Expected Discharge Plan: Skilled Nursing Facility Barriers to Discharge: Insurance Authorization  Expected Discharge Plan and Services   Discharge Planning Services: CM Consult Post Acute Care Choice: Home Health Living arrangements for the past 2 months: Single Family Home                 DME Arranged: N/A DME Agency: NA       HH Arranged: NA           Social Determinants of Health (SDOH) Interventions SDOH Screenings   Food Insecurity: No Food Insecurity (05/13/2022)  Housing: Low Risk  (05/13/2022)  Transportation Needs: No Transportation Needs (05/13/2022)  Utilities: Not At Risk (05/13/2022)  Depression (PHQ2-9): Low Risk  (04/07/2022)  Financial Resource Strain: Low Risk  (05/31/2021)  Physical Activity: Inactive (05/31/2021)  Stress: No Stress Concern Present (05/31/2021)  Tobacco Use: Low Risk  (05/16/2022)    Readmission Risk Interventions     No data to display

## 2022-05-22 NOTE — Plan of Care (Signed)
  Problem: Education: Goal: Knowledge of General Education information will improve Description Including pain rating scale, medication(s)/side effects and non-pharmacologic comfort measures Outcome: Progressing   Problem: Health Behavior/Discharge Planning: Goal: Ability to manage health-related needs will improve Outcome: Progressing   Problem: Clinical Measurements: Goal: Ability to maintain clinical measurements within normal limits will improve Outcome: Progressing Goal: Will remain free from infection Outcome: Progressing Goal: Diagnostic test results will improve Outcome: Progressing Goal: Respiratory complications will improve Outcome: Progressing Goal: Cardiovascular complication will be avoided Outcome: Progressing   Problem: Nutrition: Goal: Adequate nutrition will be maintained Outcome: Progressing   Problem: Pain Managment: Goal: General experience of comfort will improve Outcome: Progressing   

## 2022-05-22 NOTE — Consult Note (Addendum)
Urology Consult  I have been asked to see the patient by Dr. Fran Lowes, for evaluation and management of gross hematuria.  Chief Complaint: Gross hematuria  History of Present Illness: Jamie [name] is a 67 y.o. year old female with PMH DM, CKD, OSA on CPAP, nephrolithiasis, and UC s/p colectomy with ileostomy admitted on 05/10/2022 after a fall in which she sustained an L1 compression fracture, now s/p posterior thoracic fusion T11-L3 with Dr. Myer Haff.  She developed AKI postoperatively with creatinine peaking at 7.52. Bladder scan showed >937mL and Foley was placed after 5 attempts on 05/18/2022. Cr has been downtrending since, now 1.32 (at baseline).  She developed gross hematuria following Foley placement which has persisted. CT AP without contrast on 4/16 showed a decompressed bladder with a looped Foley in place, stable mild left hydronephrosis with 2 small left lower pole stones. Hemoglobin now stable, 9.2. Last heparin administered 14:28 on 4/27.  She is accompanied today by her son at the bedside. Foley in place draining clear, watermelon urine without clots. She reports occasional sensations of the urge to void with the Foley but these are not uncomfortable. She is a never smoker, no industrial dye exposure. She denies prior episodes of gross hematuria.  Past Medical History:  Diagnosis Date   Anemia    Arthritis    generalized   Bowel obstruction (HCC)    repeated (? possible adhesions) neg EGD   Chronic kidney disease    Colitis    with colectomy   Diabetes mellitus without complication (HCC)    Dyspnea    covid   Gall stones 01/23/2006   Gastroenteritis 07/25/2014   hosp for IVF    GERD (gastroesophageal reflux disease)    History of kidney stones    HTN (hypertension)    Hyperglycemia    mild-monitors A1C   Obesity    Pneumonia    Rosacea    Sleep apnea    CPAP everynight    Past Surgical History:  Procedure Laterality Date   CHOLECYSTECTOMY     COLECTOMY   1993   has ileostomy   COLON SURGERY  2021   removed rectum   COLONOSCOPY  08/1991   COLOSTOMY REVISION  12/06/2011   Procedure: COLOSTOMY REVISION;  Surgeon: Romie Levee, MD;  Location: [facility];  Service: General;  Laterality: Right;  OSTOMY revision   CYSTOSCOPY/URETEROSCOPY/HOLMIUM LASER/STENT PLACEMENT Left 04/11/2021   Procedure: CYSTOSCOPY/URETEROSCOPY/HOLMIUM LASER/STENT PLACEMENT;  Surgeon: Vanna Scotland, MD;  Location: [facility];  Service: Urology;  Laterality: Left;   EXAMINATION UNDER ANESTHESIA  12/06/2011   Procedure: EXAM UNDER ANESTHESIA;  Surgeon: Romie Levee, MD;  Location: [facility];  Service: General;  Laterality: N/A;  rectal exam under anesthesia, anal dilation, pouchoscopy     TOTAL ABDOMINAL HYSTERECTOMY  05/2006   for abscessed ovaries    Home Medications:  Current Meds  Medication Sig   acetaminophen (TYLENOL) 325 MG tablet Take 650 mg by mouth every 6 (six) hours as needed for moderate pain.   atorvastatin (LIPITOR) 10 MG tablet TAKE 1/2 PILL BY MOUTH EVERY OTHER DAY (Patient taking differently: Take 5 mg by mouth every other day.)   Cholecalciferol (VITAMIN D3) 25 MCG (1000 UT) CAPS Take 1,000 Units by mouth daily.   esomeprazole (NEXIUM) 20 MG capsule Take 20 mg by mouth every other day.   fluticasone (FLONASE) 50 MCG/ACT nasal spray Place 2 sprays into both nostrils at bedtime.   glipiZIDE (GLUCOTROL) 5 MG tablet Take  1 tablet (5 mg total) by mouth daily before breakfast.   losartan (COZAAR) 25 MG tablet Take 25 mg by mouth daily.   melatonin 1 MG TABS tablet Take 1.5 mg by mouth at bedtime.   Multiple Vitamin (MULTIVITAMIN) tablet Take 1 tablet by mouth daily.   [EXPIRED] oxyCODONE-acetaminophen (PERCOCET) 5-325 MG tablet Take 1 tablet by mouth every 6 (six) hours as needed for up to 7 days for severe pain.   zolpidem (AMBIEN) 10 MG tablet Take 0.5-1 tablets (5-10 mg total) by mouth at bedtime as needed. for sleep    [DISCONTINUED] metoprolol tartrate (LOPRESSOR) 25 MG tablet TAKE 1 TABLET BY MOUTH EVERY DAY (Patient taking differently: Take 25 mg by mouth daily.)    Allergies:  Allergies  Allergen Reactions   Ciprofloxacin Rash    Erythema and pruritis around IV site, erythema and rash along vein   Other Rash   Jardiance [Empagliflozin]     Increase in Cr    Lisinopril     REACTION: felt bad/ stomach hurt/ weak and tired   Metformin And Related     GI   Sulfa Antibiotics Rash    Family History  Problem Relation Age of Onset   Hypertension Mother    Hyperlipidemia Mother    Cirrhosis Mother        NASH   Diabetes Mother    Liver cancer Father        resection secondary to mets   Cancer Father        colon   Hyperlipidemia Father    Hypertension Father    Allergies Father    Ulcerative colitis Father    Allergies Brother    Gastric cancer Brother    Breast cancer Paternal Grandmother    Breast cancer Cousin 35       paternal cousins    Social History:  reports that she has never smoked. She has never used smokeless tobacco. She reports that she does not drink alcohol and does not use drugs.  ROS: A complete review of systems was performed.  All systems are negative except for pertinent findings as noted.  Physical Exam:  Vital signs in last 24 hours: Temp:  [98.3 F (36.8 C)-98.9 F (37.2 C)] 98.9 F (37.2 C) (04/29 0828) Pulse Rate:  [79-83] 81 (04/29 0828) Resp:  [17-18] 18 (04/29 0828) BP: (124-168)/(34-52) 168/47 (04/29 0828) SpO2:  [98 %-100 %] 100 % (04/29 0828) Constitutional:  Alert and oriented, no acute distress HEENT: [place] AT, moist mucus membranes Cardiovascular: No clubbing, cyanosis, or edema Respiratory: Normal respiratory effort Lymph: No cervical or inguinal adenopathy Neurologic: Grossly intact, no focal deficits, moving all 4 extremities Psychiatric: Normal mood and affect  Laboratory Data:  Recent Labs    05/20/22 0549 05/21/22 0520  05/22/22 0350  WBC 8.8 13.2* 16.7*  HGB 9.7* 9.7* 9.2*  HCT 29.6* 29.5* 28.3*   Recent Labs    05/20/22 0549 05/21/22 0520 05/22/22 0350  NA 142 137 138  K 3.2* 3.7 3.2*  CL 103 101 102  CO2 32 27 30  GLUCOSE 175* 261* 150*  BUN 35* 25* 18  CREATININE 1.64* 1.50* 1.32*  CALCIUM 8.4* 8.4* 8.2*   Urinalysis    Component Value Date/Time   COLORURINE YELLOW (A) 05/18/2022 1725   APPEARANCEUR HAZY (A) 05/18/2022 1725   LABSPEC 1.010 05/18/2022 1725   PHURINE 5.0 05/18/2022 1725   GLUCOSEU NEGATIVE 05/18/2022 1725   HGBUR MODERATE (A) 05/18/2022 1725   HGBUR trace-lysed 09/08/2008  1009   BILIRUBINUR NEGATIVE 05/18/2022 1725   BILIRUBINUR negative 01/12/2021 1459   KETONESUR NEGATIVE 05/18/2022 1725   PROTEINUR NEGATIVE 05/18/2022 1725   UROBILINOGEN 0.2 01/12/2021 1459   UROBILINOGEN 0.2 09/08/2008 1009   NITRITE NEGATIVE 05/18/2022 1725   LEUKOCYTESUR TRACE (A) 05/18/2022 1725   Results for orders placed or performed during the hospital encounter of 05/10/22  Culture, blood (Routine X 2) w Reflex to ID Panel     Status: None (Preliminary result)   Collection Time: 05/18/22  2:12 PM   Specimen: BLOOD  Result Value Ref Range Status   Specimen Description BLOOD BLOOD LEFT FOREARM  Final   Special Requests   Final    BOTTLES DRAWN AEROBIC AND ANAEROBIC Blood Culture adequate volume   Culture   Final    NO GROWTH 4 DAYS Performed at [facility], [address], [place], [place] [address]    Report Status PENDING  Incomplete  Culture, blood (Routine X 2) w Reflex to ID Panel     Status: None (Preliminary result)   Collection Time: 05/18/22  2:13 PM   Specimen: BLOOD  Result Value Ref Range Status   Specimen Description BLOOD RIGHT ANTECUBITAL  Final   Special Requests   Final    BOTTLES DRAWN AEROBIC AND ANAEROBIC Blood Culture adequate volume   Culture   Final    NO GROWTH 4 DAYS Performed at [facility], [address] [place], [place]  [address]    Report Status PENDING  Incomplete   Radiologic Imaging: No results found.  Assessment & Plan:  67 year old female admitted with lumbar compression fracture s/p fusion and developed postoperative urinary retention with AKI and gross hematuria following Foley placement.  AKI has resolved with bladder decompression.  Her gross hematuria has persisted.  Gross hematuria likely multifactorial in the setting of traumatic Foley placement with multiple attempts, rapid bladder decompression, and anticoagulation, though admittedly it has persisted a bit longer than typical.  Fortunately, her hemoglobin has been rather stable and there is no indication for urgent urologic intervention or testing at this time.  She is rather low risk for urologic malignancy.  Would recommend repeating a UA to rule out infection, though she does not appear clinically infected today.  Recommend outpatient voiding trial in 1 to 2 weeks as well as cystoscopy.  She and her son are in agreement with this plan.  Recommendations: -Repeat UA to rule out infection -Trend H&H -Continue Foley catheter -Outpatient voiding trial in 1 to 2 weeks -Outpatient cystoscopy in about 1 month  Thank you for involving me in this patient's care, please page with any further questions or concerns.  Carman Ching, PA-C 05/22/2022 1:07 PM

## 2022-05-22 NOTE — Progress Notes (Signed)
Occupational Therapy Treatment Patient Details Name: Jamie [name] MRN: [id number] DOB: Dec 31, 1955 Today's Date: 05/22/2022   History of present illness Pt is a 67 y/o F admitted on 05/10/22 after presenting with c/o a fall resulting in lower back pain. Pt found to have compression fx of L1 lumbar vertebra. Neurosurgery was consulted & pt underwent posterior thoracic fusion T11-L3 on 05/12/22. PMH: HTN, HLD, DM, CKD3a, OSA on CPAP, morbid obesity, anemia, gallstone, kidney stone, ulcerative colitis s/p colectomy & ileostomy, peripheral neuropathy   OT comments  Pt seen for OT tx. Agreeable to OT, tearful and expressing frustration with bladder issues at the moment. Active listening and emotional support provided. Pt completed bed mobility with Sup-CGA +time/effort, MOD A for TLSO mgt, STS with CGA, and SBA for ADL mobility in room with RW to sink and to recliner. Pt emptied ostomy with set up for supplies only from bed level. Pt stood at sink but then preferred to complete grooming tasks in sitting in recliner. Set up only. Pt endorsed only 3/10 back pain with mobility, improving with seated rest break. Pt progressing well towards goals. Continue with POC.    Recommendations for follow up therapy are one component of a multi-disciplinary discharge planning process, led by the attending physician.  Recommendations may be updated based on patient status, additional functional criteria and insurance authorization.    Assistance Recommended at Discharge Intermittent Supervision/Assistance  Patient can return home with the following  A little help with walking and/or transfers;A lot of help with bathing/dressing/bathroom;Assistance with cooking/housework;Help with stairs or ramp for entrance;Assist for transportation   Equipment Recommendations  BSC/3in1    Recommendations for Other Services      Precautions / Restrictions Precautions Precautions: Fall;Back Required Braces or Orthoses: Spinal  Brace Cervical Brace: For comfort Spinal Brace: Thoracolumbosacral orthotic;Applied in sitting position Restrictions Weight Bearing Restrictions: No       Mobility Bed Mobility Overal bed mobility: Needs Assistance Bed Mobility: Rolling, Sidelying to Sit Rolling: Supervision Sidelying to sit: Min guard       General bed mobility comments: +time/effort, heavy UE support but able to complete without direct assist    Transfers Overall transfer level: Needs assistance Equipment used: Rolling walker (2 wheels) Transfers: Sit to/from Stand Sit to Stand: Min guard, From elevated surface                 Balance Overall balance assessment: Needs assistance Sitting-balance support: Bilateral upper extremity supported, No upper extremity supported Sitting balance-Leahy Scale: Good     Standing balance support: Single extremity supported, Bilateral upper extremity supported, During functional activity Standing balance-Leahy Scale: Good                             ADL either performed or assessed with clinical judgement   ADL                                         General ADL Comments: Pt emptied ostomy with set up for supplies only from bed level. Pt stood at sink but then preferred to complete grooming tasks in sitting in recliner. Set up only. MOD A to don TLSO but pt was able to instruct in steps to don.    Extremity/Trunk Assessment              Vision  Perception     Praxis      Cognition Arousal/Alertness: Awake/alert Behavior During Therapy: WFL for tasks assessed/performed Overall Cognitive Status: Within Functional Limits for tasks assessed                                 General Comments: Pt a bit tearful during session, expressing frustration with bladder issues. Emotional support and active listening provided.        Exercises      Shoulder Instructions       General Comments       Pertinent Vitals/ Pain       Pain Assessment Pain Assessment: 0-10 Pain Score: 3  Pain Location: back Pain Descriptors / Indicators: Aching Pain Intervention(s): Monitored during session, Repositioned, Patient requesting pain meds-RN notified  Home Living                                          Prior Functioning/Environment              Frequency  Min 3X/week        Progress Toward Goals  OT Goals(current goals can now be found in the care plan section)  Progress towards OT goals: Progressing toward goals  Acute Rehab OT Goals Patient Stated Goal: to improve pain OT Goal Formulation: With patient Time For Goal Achievement: 05/27/22 Potential to Achieve Goals: Good  Plan Discharge plan remains appropriate;Frequency remains appropriate    Co-evaluation                 AM-PAC OT "6 Clicks" Daily Activity     Outcome Measure   Help from another person eating meals?: None Help from another person taking care of personal grooming?: None Help from another person toileting, which includes using toliet, bedpan, or urinal?: A Little Help from another person bathing (including washing, rinsing, drying)?: A Lot Help from another person to put on and taking off regular upper body clothing?: A Little Help from another person to put on and taking off regular lower body clothing?: A Lot 6 Click Score: 18    End of Session Equipment Utilized During Treatment: Rolling walker (2 wheels)  OT Visit Diagnosis: Other abnormalities of gait and mobility (R26.89);Muscle weakness (generalized) (M62.81)   Activity Tolerance Patient tolerated treatment well   Patient Left in chair;with call bell/phone within reach   Nurse Communication Patient requests pain meds        Time: 1610-9604 OT Time Calculation (min): 32 min  Charges: OT General Charges $OT Visit: 1 Visit OT Treatments $Self Care/Home Management : 23-37 mins  Arman Filter., MPH, MS,  OTR/L ascom [phone] 05/22/22, 10:44 AM

## 2022-05-23 DIAGNOSIS — S32010A Wedge compression fracture of first lumbar vertebra, initial encounter for closed fracture: Secondary | ICD-10-CM | POA: Diagnosis not present

## 2022-05-23 LAB — TYPE AND SCREEN
Antibody Screen: POSITIVE
Unit division: 0
Unit division: 0

## 2022-05-23 LAB — CULTURE, BLOOD (ROUTINE X 2): Culture: NO GROWTH

## 2022-05-23 LAB — CBC
HCT: 30.4 % — ABNORMAL LOW (ref 36.0–46.0)
Hemoglobin: 9.9 g/dL — ABNORMAL LOW (ref 12.0–15.0)
MCH: 31.5 pg (ref 26.0–34.0)
MCHC: 32.6 g/dL (ref 30.0–36.0)
MCV: 96.8 fL (ref 80.0–100.0)
Platelets: 284 10*3/uL (ref 150–400)
RBC: 3.14 MIL/uL — ABNORMAL LOW (ref 3.87–5.11)
RDW: 13.2 % (ref 11.5–15.5)
WBC: 15.2 10*3/uL — ABNORMAL HIGH (ref 4.0–10.5)
nRBC: 0 % (ref 0.0–0.2)

## 2022-05-23 LAB — BASIC METABOLIC PANEL
Anion gap: 9 (ref 5–15)
BUN: 16 mg/dL (ref 8–23)
CO2: 26 mmol/L (ref 22–32)
Calcium: 8.4 mg/dL — ABNORMAL LOW (ref 8.9–10.3)
Chloride: 104 mmol/L (ref 98–111)
Creatinine, Ser: 1.24 mg/dL — ABNORMAL HIGH (ref 0.44–1.00)
GFR, Estimated: 48 mL/min — ABNORMAL LOW (ref >60)
Glucose, Bld: 143 mg/dL — ABNORMAL HIGH (ref 70–99)
Potassium: 3.7 mmol/L (ref 3.5–5.1)
Sodium: 139 mmol/L (ref 135–145)

## 2022-05-23 LAB — URINE CULTURE: Culture: >=100000 — AB

## 2022-05-23 LAB — BPAM RBC
Blood Product Expiration Date: 202405162359
Blood Product Expiration Date: 202405162359
Unit Type and Rh: 5100

## 2022-05-23 LAB — MAGNESIUM: Magnesium: 1.6 mg/dL — ABNORMAL LOW (ref 1.7–2.4)

## 2022-05-23 LAB — GLUCOSE, CAPILLARY
Glucose-Capillary: 148 mg/dL — ABNORMAL HIGH (ref 70–99)
Glucose-Capillary: 279 mg/dL — ABNORMAL HIGH (ref 70–99)
Glucose-Capillary: 143 mg/dL — ABNORMAL HIGH (ref 70–99)
Glucose-Capillary: 159 mg/dL — ABNORMAL HIGH (ref 70–99)

## 2022-05-23 MED ORDER — SODIUM CHLORIDE 0.9 % IV SOLN
2.0000 g | INTRAVENOUS | Status: DC
  Administered 2022-05-23: 2 g via INTRAVENOUS
  Filled 2022-05-23: qty 20
  Filled 2022-05-23: qty 2

## 2022-05-23 MED ORDER — SODIUM CHLORIDE 0.9 % IV SOLN
2.0000 g | INTRAVENOUS | Status: DC
  Filled 2022-05-23: qty 20

## 2022-05-23 MED ORDER — INSULIN STARTER KIT- PEN NEEDLES (ENGLISH)
1.0000 | Freq: Once | Status: AC
  Administered 2022-05-23: 1
  Filled 2022-05-23: qty 1

## 2022-05-23 MED ORDER — LIVING WELL WITH DIABETES BOOK
Freq: Once | Status: AC
  Filled 2022-05-23: qty 1

## 2022-05-23 NOTE — Progress Notes (Signed)
PROGRESS NOTE    Jamie [name]  NFA:[id number] DOB: 25-Mar-1955 DOA: 05/10/2022 PCP: Judy Pimple, MD  145A/145A-AA  LOS: 12 days   Brief hospital course:   Assessment & Plan: Jamie [name] is a 67 y.o. female with medical history significant of HTN, DM, CKD-3a, OSA on CPAP, morbid obesity, anemia, kidney stone, ulcerative colitis (s/p of colectomy and s/p ileostomy), who presented with fall and lower back pain.    Pt states that she accidentally fell in church, landing on her back.   Compression fracture of L1 lumbar vertebra:  s/p open reduction internal fixation of T12 & L1 fractures & T11-L3 posterior arthrodesis as per neuro surg.  --Continue Brace while OOB for 6 weeks. --outpatient f/u w/ neuro surg, Dr. Myer Haff.  --PT now cleared pt to go home with [facility]  AKI 2/2 urinary retention  CKDIIIa Cr peaked at 7.52.  Bladder scan on 4/25 found >973ml post void.  Foley placed.  Cr improved after bladder decompression. Plan: --cont Foley for bladder decompression.  --Will discharge with Foley, Outpatient voiding trial in 1 to 2 weeks with urology. --oral hydration --hold losartan  Gross Hematuria, improved --likely from bladder trauma from over-distention.  Hematuria started to clear up today. --Urology consulted Plan: --Outpatient cystoscopy in about 1 month   Proteus in urine cx --urine cx from 4/29 pos for proteus.  Given hematuria and bladder trauma, will cover with abx. --start ceftriaxone today  DM2:  HbA1c 6.4 three months ago, however, has needed significant amount of insulin during hospitalization. Continue on SSI w/ accuchecks  --cont glargine to 18u nightly --cont mealtime 7u TID  Acute metabolic acidosis, resolved S/p bicarb gtt  Fall:  at home. Fall precautions    HTN:  --cont Lopressor --hold losartan 2/2 AKI   Ulcerative colitis: s/p colectomy and ileostomy.  No acute issues    HLD:  continue on statin    Leukocytosis:  likely reactive.     OSA:  continue on BiPAP    Morbid obesity: BMI 43.4.  Complicates overall care & prognosis   Hypomag --cont Mag-ox    DVT prophylaxis: Heparin SQ Code Status: Full code  Family Communication: husband updated at bedside today Level of care: Med-Surg Dispo:   The patient is from: home Anticipated d/c is to: home Anticipated d/c date is: 1-2 days, after urine cx sensitivity result.   Subjective and Interval History:  PT found pt doing well enough to go home.  Pt was planned for discharge, however, urine cx returned pos for Proteus.   Objective: Vitals:   05/22/22 1616 05/22/22 2315 05/23/22 0848 05/23/22 1556  BP: (!) 146/47 (!) 145/41 (!) 142/44 (!) 144/45  Pulse: 74 72 84 73  Resp: 17 18 18 18   Temp: 98.4 F (36.9 C) 98.3 F (36.8 C) 98.1 F (36.7 C) 98.1 F (36.7 C)  TempSrc:      SpO2: 99% 98% 96% 98%  Weight:      Height:        Intake/Output Summary (Last 24 hours) at 05/23/2022 2028 Last data filed at 05/23/2022 1927 Gross per 24 hour  Intake --  Output 3300 ml  Net -3300 ml   Filed Weights   05/11/22 1628 05/12/22 0701  Weight: 120.2 kg 120.2 kg    Examination:   Constitutional: NAD, AAOx3 HEENT: conjunctivae and lids normal, EOMI CV: No cyanosis.   RESP: normal respiratory effort, on RA Neuro: II - XII grossly intact.   Psych: Normal mood and  affect.  Appropriate judgement and reason  Foley present with light red urine   Data Reviewed: I have personally reviewed labs and imaging studies  Time spent: 50 minutes  Darlin Priestly, MD [facility] If 7PM-7AM, please contact night-coverage 05/23/2022, 8:28 PM

## 2022-05-23 NOTE — Inpatient Diabetes Management (Signed)
Inpatient Diabetes Program Recommendations  AACE/ADA: New Consensus Statement on Inpatient Glycemic Control (2015)  Target Ranges:  Prepandial:   less than 140 mg/dL      Peak postprandial:   less than 180 mg/dL (1-2 hours)      Critically ill patients:  140 - 180 mg/dL   Lab Results  Component Value Date   GLUCAP 279 (H) 05/23/2022   HGBA1C 6.4 01/31/2022    Latest Reference Range & Units 05/22/22 08:27 05/22/22 12:02 05/22/22 16:45 05/22/22 21:31 05/23/22 07:58 05/23/22 11:45  Glucose-Capillary 70 - 99 mg/dL 161 (H) 096 (H) 045 (H) 159 (H) 148 (H) 279 (H)  (H): Data is abnormally high  Diabetes history: DM2 Outpatient Diabetes medications: Glipizide 5 mg QD + Mounjaro 5 mg Qweek  Current orders for Inpatient glycemic control: Semglee 18 units qd, Novolog 7 units tid meal coverage, Novolog 0-9 units tid  Educated patient and spouse on insulin pen use at home. Reviewed contents of insulin flexpen starter kit. Reviewed all steps if insulin pen including attachment of needle, 2-unit air shot, dialing up dose, giving injection, removing needle, disposal of sharps, storage of unused insulin, disposal of insulin etc. Patient able to provide successful return demonstration. Also reviewed troubleshooting with insulin pen. MD to give patient Rxs for insulin pens and insulin pen needles.    MD ordered application of Freestyle CGM at discharge for patient. Education done regarding application and changing CGM sensor (alternate every 14 days on back of arms), 1 hour warm-up, use of glucometer when alert displays, how to scan CGM for glucose reading and information for PCP. Patient has also been given educational packet regarding use CGM sensor including the 1-800 toll free number for any questions, problems or needs related to the [facility] sensors or reader.    Sensor applied by patient to (L) Arm at 1230p.  Explained that glucose readings will not be available until 1 hour after application.  Reviewed use of CGM including how to scan, changing Sensor, Vitamin C warning, arrows with glucose readings, and Freestyle app. Gave patient Freestyle reader as well due to the app not being compatible with her phone.  Patient very appreciative.   Thank you, Billy Fischer. Dathan Attia, RN, MSN, CDE  Diabetes Coordinator Inpatient Glycemic Control Team Team Pager [phone] (8am-5pm) 05/23/2022 3:12 PM

## 2022-05-23 NOTE — Progress Notes (Signed)
Occupational Therapy Treatment Patient Details Name: Jamie [name] MRN: [id number] DOB: September 26, 1955 Today's Date: 05/23/2022   History of present illness Pt is a 67 y/o F admitted on 05/10/22 after presenting with c/o a fall resulting in lower back pain. Pt found to have compression fx of L1 lumbar vertebra. Neurosurgery was consulted & pt underwent posterior thoracic fusion T11-L3 on 05/12/22. PMH: HTN, HLD, DM, CKD3a, OSA on CPAP, morbid obesity, anemia, gallstone, kidney stone, ulcerative colitis s/p colectomy & ileostomy, peripheral neuropathy   OT comments  Pt progressing well towards goals. Completed bed mobility with supervision, MIN A to don TLSO with 1 VC for how to adjust for improved comfort, SBA for STS with RW, and ambulated with CGA to sink. Pt completed oral care with supervision at sink and 1 VC for posture. Pt requested to ambulate around nurses station 1x lap. Pt educated in obstacle negotiation with RW and posture to maintain head up in anticipation for eventual return home to improve safety. HR 129 after returning to room and sitting in recliner. VC for PLB to support recovery with HR 108 within 3 min. O2 99% throughout. Pt continues to progress towards goals, continue with POC.    Recommendations for follow up therapy are one component of a multi-disciplinary discharge planning process, led by the attending physician.  Recommendations may be updated based on patient status, additional functional criteria and insurance authorization.    Assistance Recommended at Discharge Intermittent Supervision/Assistance  Patient can return home with the following  A little help with walking and/or transfers;A lot of help with bathing/dressing/bathroom;Assistance with cooking/housework;Help with stairs or ramp for entrance;Assist for transportation   Equipment Recommendations  BSC/3in1    Recommendations for Other Services      Precautions / Restrictions Precautions Precautions:  Fall;Back Required Braces or Orthoses: Spinal Brace Cervical Brace: For comfort Spinal Brace: Thoracolumbosacral orthotic;Applied in sitting position Restrictions Weight Bearing Restrictions: No       Mobility Bed Mobility Overal bed mobility: Needs Assistance Bed Mobility: Rolling, Sidelying to Sit Rolling: Supervision Sidelying to sit: Supervision       General bed mobility comments: increased time/effort but completed without direct assist    Transfers Overall transfer level: Needs assistance Equipment used: Rolling walker (2 wheels) Transfers: Sit to/from Stand Sit to Stand: Min guard           General transfer comment: initial VC for anterior weight shift prior to attempting to stand, good hand placement     Balance Overall balance assessment: Needs assistance Sitting-balance support: Single extremity supported, No upper extremity supported, Feet supported Sitting balance-Leahy Scale: Good     Standing balance support: Bilateral upper extremity supported, During functional activity, Single extremity supported Standing balance-Leahy Scale: Good                             ADL either performed or assessed with clinical judgement   ADL                                         General ADL Comments: Pt completed ostomy care with spouse assist at start of session without difficulty. Donned TLSO with MIN A in sitting (1 VC for improving tightening as needed), completed oral care standing at sink with 1 VC for posture for improved comfort, and preferred to comb hair in sitting at  end of session. Indep with doffing brace in sitting.    Extremity/Trunk Assessment              Vision       Perception     Praxis      Cognition Arousal/Alertness: Awake/alert Behavior During Therapy: WFL for tasks assessed/performed Overall Cognitive Status: Within Functional Limits for tasks assessed                                           Exercises Other Exercises Other Exercises: Pt ambulated around nursing station 1x lap per pt request, educated in obstacle negotiation in anticipation for eventual return home. HR 129 after returning to room and sitting in recliner. VC for PLB to support recovery with HR 108 within 3 min. O2 99% throughout.    Shoulder Instructions       General Comments      Pertinent Vitals/ Pain       Pain Assessment Pain Assessment: 0-10 Pain Score: 2  Pain Location: back Pain Descriptors / Indicators: Aching Pain Intervention(s): Limited activity within patient's tolerance, Monitored during session, Repositioned, Patient requesting pain meds-RN notified  Home Living                                          Prior Functioning/Environment              Frequency  Min 3X/week        Progress Toward Goals  OT Goals(current goals can now be found in the care plan section)  Progress towards OT goals: Progressing toward goals  Acute Rehab OT Goals Patient Stated Goal: to improve pain OT Goal Formulation: With patient Time For Goal Achievement: 05/27/22 Potential to Achieve Goals: Good  Plan Discharge plan remains appropriate;Frequency remains appropriate    Co-evaluation                 AM-PAC OT "6 Clicks" Daily Activity     Outcome Measure   Help from another person eating meals?: None Help from another person taking care of personal grooming?: None Help from another person toileting, which includes using toliet, bedpan, or urinal?: A Little Help from another person bathing (including washing, rinsing, drying)?: A Lot Help from another person to put on and taking off regular upper body clothing?: A Little Help from another person to put on and taking off regular lower body clothing?: A Lot 6 Click Score: 18    End of Session Equipment Utilized During Treatment: Rolling walker (2 wheels);Back brace  OT Visit Diagnosis: Other  abnormalities of gait and mobility (R26.89);Muscle weakness (generalized) (M62.81)   Activity Tolerance Patient tolerated treatment well   Patient Left in chair;with call bell/phone within reach;with family/visitor present   Nurse Communication Patient requests pain meds        Time: 7829-5621 OT Time Calculation (min): 28 min  Charges: OT General Charges $OT Visit: 1 Visit OT Treatments $Self Care/Home Management : 23-37 mins  Arman Filter., MPH, MS, OTR/L ascom [phone] 05/23/22, 10:02 AM

## 2022-05-23 NOTE — TOC Progression Note (Signed)
Transition of Care [facility]) - Progression Note    Patient Details  Name: Jamie [name] MRN: [id number] Date of Birth: 27-Feb-1955  [facility]) CM/SW Contact  Marlowe Sax, RN Phone Number: 05/23/2022, 11:56 AM  Clinical Narrative:    Spoke with the patient and her husband, they have decided to go home with [facility] instead of STR [facility] accepted the patient She has a rw at home and does jot feel she needs any other DME Husband to transport   Expected Discharge Plan: Home w Home Health Services Barriers to Discharge: Barriers Resolved  Expected Discharge Plan and Services   Discharge Planning Services: CM Consult Post Acute Care Choice: Home Health Living arrangements for the past 2 months: Single Family Home                 DME Arranged: N/A DME Agency: NA       HH Arranged: PT, OT HH Agency: [facility] Date [facility] Agency Contacted: 05/23/22 Time HH Agency Contacted: 1156 Representative spoke with at [facility] Agency: Kandee Keen   Social Determinants of Health (SDOH) Interventions SDOH Screenings   Food Insecurity: No Food Insecurity (05/13/2022)  Housing: Low Risk  (05/13/2022)  Transportation Needs: No Transportation Needs (05/13/2022)  Utilities: Not At Risk (05/13/2022)  Depression (PHQ2-9): Low Risk  (04/07/2022)  Financial Resource Strain: Low Risk  (05/31/2021)  Physical Activity: Inactive (05/31/2021)  Stress: No Stress Concern Present (05/31/2021)  Tobacco Use: Low Risk  (05/16/2022)    Readmission Risk Interventions     No data to display

## 2022-05-23 NOTE — Plan of Care (Signed)

## 2022-05-23 NOTE — Progress Notes (Signed)
Physical Therapy Treatment Patient Details Name: Jamie [name] MRN: [id number] DOB: [birth year] Today's Date: 05/23/2022   History of Present Illness Pt is a 67 y/o F admitted on 05/10/22 after presenting with c/o a fall resulting in lower back pain. Pt found to have compression fx of L1 lumbar vertebra. Neurosurgery was consulted & pt underwent posterior thoracic fusion T11-L3 on 05/12/22. PMH: HTN, HLD, DM, CKD3a, OSA on CPAP, morbid obesity, anemia, gallstone, kidney stone, ulcerative colitis s/p colectomy & ileostomy, peripheral neuropathy    PT Comments    Walked to/from PT gym for stair training.  Progressing well towards goals and increasing comfort with mobility.  Recommendations for follow up therapy are one component of a multi-disciplinary discharge planning process, led by the attending physician.  Recommendations may be updated based on patient status, additional functional criteria and insurance authorization.  Follow Up Recommendations       Assistance Recommended at Discharge Intermittent Supervision/Assistance  Patient can return home with the following A little help with walking and/or transfers;A little help with bathing/dressing/bathroom;Assistance with cooking/housework;Direct supervision/assist for medications management;Direct supervision/assist for financial management;Assist for transportation;Help with stairs or ramp for entrance   Equipment Recommendations  Other (comment) (Defer to next level of care)    Recommendations for Other Services       Precautions / Restrictions Precautions Precautions: Fall;Back Required Braces or Orthoses: Spinal Brace Cervical Brace: For comfort Spinal Brace: Thoracolumbosacral orthotic;Applied in sitting position Restrictions Weight Bearing Restrictions: No     Mobility  Bed Mobility               General bed mobility comments: in recliner before and after Patient Response: Cooperative  Transfers Overall  transfer level: Needs assistance   Transfers: Sit to/from Stand Sit to Stand: Supervision                Ambulation/Gait Ambulation/Gait assistance: Supervision, Min guard Gait Distance (Feet): 360 Feet Assistive device: Rolling walker (2 wheels) Gait Pattern/deviations: Step-through pattern, Decreased step length - right, Decreased step length - left, Trunk flexed Gait velocity: decreased         Stairs Stairs: Yes Stairs assistance: Supervision, Min guard Stair Management: Step to pattern, Forwards, Two rails Number of Stairs: 4     Wheelchair Mobility    Modified Rankin (Stroke Patients Only)       Balance Overall balance assessment: Needs assistance Sitting-balance support: Single extremity supported, No upper extremity supported, Feet supported Sitting balance-Leahy Scale: Good     Standing balance support: Bilateral upper extremity supported, During functional activity, Single extremity supported Standing balance-Leahy Scale: Good                              Cognition Arousal/Alertness: Awake/alert Behavior During Therapy: WFL for tasks assessed/performed Overall Cognitive Status: Within Functional Limits for tasks assessed                                          Exercises      General Comments        Pertinent Vitals/Pain Pain Assessment Pain Assessment: Faces Faces Pain Scale: Hurts a little bit Pain Location: back Pain Descriptors / Indicators: Aching Pain Intervention(s): Limited activity within patient's tolerance, Monitored during session, Repositioned    Home Living  Prior Function            PT Goals (current goals can now be found in the care plan section) Progress towards PT goals: Progressing toward goals    Frequency    7X/week      PT Plan Current plan remains appropriate    Co-evaluation              AM-PAC PT "6 Clicks" Mobility    Outcome Measure  Help needed turning from your back to your side while in a flat bed without using bedrails?: A Little Help needed moving from lying on your back to sitting on the side of a flat bed without using bedrails?: A Lot Help needed moving to and from a bed to a chair (including a wheelchair)?: A Little Help needed standing up from a chair using your arms (e.g., wheelchair or bedside chair)?: A Little Help needed to walk in hospital room?: A Little Help needed climbing 3-5 steps with a railing? : A Little 6 Click Score: 17    End of Session   Activity Tolerance: Patient tolerated treatment well Patient left: in chair;with call bell/phone within reach;with chair alarm set;with family/visitor present Nurse Communication: Mobility status PT Visit Diagnosis: Muscle weakness (generalized) (M62.81);Pain;Difficulty in walking, not elsewhere classified (R26.2);Other abnormalities of gait and mobility (R26.89);Unsteadiness on feet (R26.81)     Time: 1610-9604 PT Time Calculation (min) (ACUTE ONLY): 22 min  Charges:  $Gait Training: 8-22 mins                   Danielle Dess, PTA 05/23/22, 11:25 AM

## 2022-05-23 NOTE — Progress Notes (Signed)
[facility]  ROUNDING NOTE   Subjective:   [name] was admitted to [facility] on 05/10/2022 for Compression fracture of L1 lumbar vertebra (HCC) [S32.010A] Compression fracture of lumbar vertebra, unspecified lumbar vertebral level, initial encounter [facility]) [S32.000A]  Patient is followed by our practice outpatient by Dr Jamie [name].   Patient seen sitting up in chair No family at bedside Completed breakfast tray at bedside, denies nausea vomiting States she feels well today Pink-tinged urine in Foley catheter   Objective:  Vital signs in last 24 hours:  Temp:  [98.1 F (36.7 C)-98.4 F (36.9 C)] 98.1 F (36.7 C) (04/30 0848) Pulse Rate:  [72-84] 84 (04/30 0848) Resp:  [17-18] 18 (04/30 0848) BP: (142-146)/(41-47) 142/44 (04/30 0848) SpO2:  [96 %-99 %] 96 % (04/30 0848) FiO2 (%):  [21 %] 21 % (04/29 2014)  Weight change:  Filed Weights   05/11/22 1628 05/12/22 0701  Weight: 120.2 kg 120.2 kg    Intake/Output: I/O last 3 completed shifts: In: 600 [P.O.:600] Out: 6450 [Urine:5100; Stool:1350]   Intake/Output this shift:  Total I/O In: -  Out: 450 [Urine:450]  Physical Exam: General: NAD sitting in chair  Head: Normocephalic, atraumatic. Moist oral mucosal membranes  Eyes: Anicteric  Lungs:  Clear to auscultation  Heart: Regular rate and rhythm  Abdomen:  Soft, nontender  Extremities:  no peripheral edema.  Neurologic: Nonfocal, moving all four extremities  Skin: No lesions   GU Foley-pink    Basic Metabolic Panel: Recent Labs  Lab 05/19/22 0542 05/20/22 0549 05/21/22 0520 05/22/22 0350 05/23/22 0519  NA 133* 142 137 138 139  K 3.2* 3.2* 3.7 3.2* 3.7  CL 93* 103 101 102 104  CO2 27 32 27 30 26   GLUCOSE 203* 175* 261* 150* 143*  BUN 73* 35* 25* 18 16  CREATININE 5.09* 1.64* 1.50* 1.32* 1.24*  CALCIUM 7.7* 8.4* 8.4* 8.2* 8.4*  MG 1.9 1.7 1.3* 1.4* 1.6*     Liver Function Tests: No results for input(s): "AST", "ALT", "ALKPHOS",  "BILITOT", "PROT", "ALBUMIN" in the last 168 hours. No results for input(s): "LIPASE", "AMYLASE" in the last 168 hours. No results for input(s): "AMMONIA" in the last 168 hours.  CBC: Recent Labs  Lab 05/19/22 0542 05/20/22 0549 05/21/22 0520 05/22/22 0350 05/23/22 0519  WBC 11.3* 8.8 13.2* 16.7* 15.2*  HGB 9.4* 9.7* 9.7* 9.2* 9.9*  HCT 28.2* 29.6* 29.5* 28.3* 30.4*  MCV 93.4 96.1 96.7 97.9 96.8  PLT 206 226 238 265 284     Cardiac Enzymes: No results for input(s): "CKTOTAL", "CKMB", "CKMBINDEX", "TROPONINI" in the last 168 hours.  BNP: Invalid input(s): "POCBNP"  CBG: Recent Labs  Lab 05/22/22 1202 05/22/22 1645 05/22/22 2131 05/23/22 0758 05/23/22 1145  GLUCAP 284* 186* 159* 148* 279*     Microbiology: Results for orders placed or performed during the hospital encounter of 05/10/22  Culture, blood (Routine X 2) w Reflex to ID Panel     Status: None   Collection Time: 05/18/22  2:12 PM   Specimen: BLOOD  Result Value Ref Range Status   Specimen Description BLOOD BLOOD LEFT FOREARM  Final   Special Requests   Final    BOTTLES DRAWN AEROBIC AND ANAEROBIC Blood Culture adequate volume   Culture   Final    NO GROWTH 5 DAYS Performed at [facility], [address], [place], [place] [address]    Report Status 05/23/2022 FINAL  Final  Culture, blood (Routine X 2) w Reflex to  ID Panel     Status: None   Collection Time: 05/18/22  2:13 PM   Specimen: BLOOD  Result Value Ref Range Status   Specimen Description BLOOD RIGHT ANTECUBITAL  Final   Special Requests   Final    BOTTLES DRAWN AEROBIC AND ANAEROBIC Blood Culture adequate volume   Culture   Final    NO GROWTH 5 DAYS Performed at [facility], [address] [place], [place] [address]    Report Status 05/23/2022 FINAL  Final  Urine Culture (for pregnant, neutropenic or urologic patients or patients with an indwelling urinary catheter)     Status: Abnormal (Preliminary result)    Collection Time: 05/22/22  3:20 PM   Specimen: Urine, Catheterized  Result Value Ref Range Status   Specimen Description   Final    URINE, CATHETERIZED Performed at [facility], [address], [place], [place] [address]    Special Requests   Final    NONE Performed at [facility], [address] [place], [place] [address]    Culture (A)  Final    >=100,000 COLONIES/mL PROTEUS MIRABILIS SUSCEPTIBILITIES TO FOLLOW Performed at [facility], [address], [place], [place] [address]    Report Status PENDING  Incomplete    Coagulation Studies: No results for input(s): "LABPROT", "INR" in the last 72 hours.  Urinalysis: No results for input(s): "COLORURINE", "LABSPEC", "PHURINE", "GLUCOSEU", "HGBUR", "BILIRUBINUR", "KETONESUR", "PROTEINUR", "UROBILINOGEN", "NITRITE", "LEUKOCYTESUR" in the last 72 hours.  Invalid input(s): "APPERANCEUR"     Imaging: No results found.   Medications:    sodium chloride     magnesium sulfate bolus IVPB      atorvastatin  5 mg Oral QODAY   Chlorhexidine Gluconate Cloth  6 each Topical Q0600   cholecalciferol  1,000 Units Oral Daily   fluticasone  2 spray Each Nare QHS   insulin aspart  0-9 Units Subcutaneous TID WC   insulin aspart  7 Units Subcutaneous TID WC   insulin glargine-yfgn  18 Units Subcutaneous QHS   losartan  25 mg Oral Daily   magic mouthwash w/lidocaine  5 mL Oral TID   magnesium oxide  800 mg Oral BID   metoprolol tartrate  25 mg Oral Daily   multivitamin with minerals  1 tablet Oral Daily   sodium chloride flush  3 mL Intravenous Q12H   acetaminophen, albuterol, dextromethorphan-guaiFENesin, hydrALAZINE, melatonin, menthol-cetylpyridinium **OR** phenol, methocarbamol, mirabegron ER, ondansetron (ZOFRAN) IV, oxyCODONE-acetaminophen, simethicone, sodium chloride flush  Assessment/ Plan:  [name] is a 67 y.o. white female with diabetes mellitus type II noninsulin dependent,  hypertension, nephrolithiasis, sleep apnea, GERD, hyperlipidemia, who is admitted to [facility] on 05/10/2022 for Compression fracture of L1 lumbar vertebra (HCC) [S32.010A] Compression fracture of lumbar vertebra, unspecified lumbar vertebral level, initial encounter (HCC) [S32.000A]  Acute kidney injury  - AKI due to urinary retention.   Gross hematuria resolving and Foley catheter Creatinine at baseline. -Urology following at discharge.  chronic kidney disease stage IIIB with proteinuria secondary to diabetic nephropathy and hypertension. Baseline creatinine of 1.32, GFR of 44 on 04/04/22. No IV contrast exposure.  - Renal ultrasound shows mild bilateral hydronephrosis and 8mm nonobstructing left renal calculus. Follows Urology outpatient.     Hypertension with chronic kidney disease:   Blood pressure 168/47 Home regimen of losartan and metoprolol. -Metoprolol and losartan ordered.   Diabetes mellitus type II with chronic kidney disease: noninsulin dependent. Hemoglobin A1c of 6.4% on 01/31/22. Placed on sliding  scale insulin. Not on an SGLT-2 inhibitor as outpatient due to history of acute kidney injury to empagliflozin.  Primary team to manage SSI   LOS: 12   4/30/20243:31 PM

## 2022-05-24 DIAGNOSIS — I10 Essential (primary) hypertension: Secondary | ICD-10-CM | POA: Diagnosis not present

## 2022-05-24 DIAGNOSIS — S32010A Wedge compression fracture of first lumbar vertebra, initial encounter for closed fracture: Secondary | ICD-10-CM | POA: Diagnosis not present

## 2022-05-24 DIAGNOSIS — S32009K Unspecified fracture of unspecified lumbar vertebra, subsequent encounter for fracture with nonunion: Secondary | ICD-10-CM

## 2022-05-24 DIAGNOSIS — N1831 Chronic kidney disease, stage 3a: Secondary | ICD-10-CM | POA: Diagnosis not present

## 2022-05-24 LAB — CBC
HCT: 30.1 % — ABNORMAL LOW (ref 36.0–46.0)
Hemoglobin: 10 g/dL — ABNORMAL LOW (ref 12.0–15.0)
MCH: 31.7 pg (ref 26.0–34.0)
MCHC: 33.2 g/dL (ref 30.0–36.0)
MCV: 95.6 fL (ref 80.0–100.0)
Platelets: 356 10*3/uL (ref 150–400)
RBC: 3.15 MIL/uL — ABNORMAL LOW (ref 3.87–5.11)
RDW: 13.4 % (ref 11.5–15.5)
WBC: 26.6 10*3/uL — ABNORMAL HIGH (ref 4.0–10.5)
nRBC: 0 % (ref 0.0–0.2)

## 2022-05-24 LAB — URINE CULTURE

## 2022-05-24 LAB — GLUCOSE, CAPILLARY
Glucose-Capillary: 166 mg/dL — ABNORMAL HIGH (ref 70–99)
Glucose-Capillary: 262 mg/dL — ABNORMAL HIGH (ref 70–99)

## 2022-05-24 LAB — BASIC METABOLIC PANEL
Anion gap: 8 (ref 5–15)
BUN: 23 mg/dL (ref 8–23)
CO2: 24 mmol/L (ref 22–32)
Calcium: 8.4 mg/dL — ABNORMAL LOW (ref 8.9–10.3)
Chloride: 103 mmol/L (ref 98–111)
Creatinine, Ser: 1.49 mg/dL — ABNORMAL HIGH (ref 0.44–1.00)
GFR, Estimated: 38 mL/min — ABNORMAL LOW (ref >60)
Glucose, Bld: 178 mg/dL — ABNORMAL HIGH (ref 70–99)
Potassium: 4.4 mmol/L (ref 3.5–5.1)
Sodium: 135 mmol/L (ref 135–145)

## 2022-05-24 LAB — CULTURE, BLOOD (ROUTINE X 2): Culture: NO GROWTH

## 2022-05-24 LAB — MAGNESIUM: Magnesium: 1.7 mg/dL (ref 1.7–2.4)

## 2022-05-24 MED ORDER — CEFADROXIL 500 MG PO CAPS: 500.0000 mg | ORAL_CAPSULE | Freq: Two times a day (BID) | ORAL | 0 refills | Status: DC

## 2022-05-24 MED ORDER — INSULIN ASPART PROT & ASPART (70-30 MIX) 100 UNIT/ML ~~LOC~~ SUSP
13.0000 [IU] | Freq: Two times a day (BID) | SUBCUTANEOUS | Status: DC
  Filled 2022-05-24: qty 10

## 2022-05-24 MED ORDER — INSULIN ASPART PROT & ASPART (70-30 MIX) 100 UNIT/ML PEN: 13.0000 [IU] | PEN_INJECTOR | Freq: Two times a day (BID) | SUBCUTANEOUS | 1 refills | Status: DC

## 2022-05-24 MED ORDER — SIMETHICONE 80 MG PO CHEW: 80.0000 mg | CHEWABLE_TABLET | Freq: Four times a day (QID) | ORAL | 0 refills | Status: AC | PRN

## 2022-05-24 MED ORDER — MAGNESIUM OXIDE -MG SUPPLEMENT 400 (240 MG) MG PO TABS: 800.0000 mg | ORAL_TABLET | Freq: Two times a day (BID) | ORAL | 0 refills | Status: AC

## 2022-05-24 NOTE — Progress Notes (Addendum)
Physical Therapy Treatment Patient Details Name: Jamie [name] MRN: [id number] DOB: Feb 12, 1955 Today's Date: 05/24/2022   History of Present Illness Pt is a 67 y/o F admitted on 05/10/22 after presenting with c/o a fall resulting in lower back pain. Pt found to have compression fx of L1 lumbar vertebra. Neurosurgery was consulted & pt underwent posterior thoracic fusion T11-L3 on 05/12/22. PMH: HTN, HLD, DM, CKD3a, OSA on CPAP, morbid obesity, anemia, gallstone, kidney stone, ulcerative colitis s/p colectomy & ileostomy, peripheral neuropathy    PT Comments    Pt was sitting in recliner upon arrival. She is A and O x 4. Pt demonstrates safe abilities to stand and ambulate with TLSO applied. Overall pt is progressing well towards all acute PT goals. Planning to DC home when cleared medically. Will decrease frequency but continue to have mobility team continue daily OOB activity.   Recommendations for follow up therapy are one component of a multi-disciplinary discharge planning process, led by the attending physician.  Recommendations may be updated based on patient status, additional functional criteria and insurance authorization.     Assistance Recommended at Discharge Intermittent Supervision/Assistance  Patient can return home with the following A little help with walking and/or transfers;A little help with bathing/dressing/bathroom;Assistance with cooking/housework;Direct supervision/assist for medications management;Direct supervision/assist for financial management;Assist for transportation;Help with stairs or ramp for entrance   Equipment Recommendations  None recommended by PT       Precautions / Restrictions Precautions Precautions: Fall;Back Precaution Comments: spinal Required Braces or Orthoses: Spinal Brace Cervical Brace: For comfort Spinal Brace: Thoracolumbosacral orthotic;Applied in sitting position     Mobility  Bed Mobility  General bed mobility comments: Pt was in  recliner pre/post session    Transfers Overall transfer level: Needs assistance Equipment used: Rolling walker (2 wheels) Transfers: Sit to/from Stand Sit to Stand: Supervision  General transfer comment: no physical assistance to stand or sit to/from recliner<> RW    Ambulation/Gait Ambulation/Gait assistance: Supervision Gait Distance (Feet): 220 Feet Assistive device: Rolling walker (2 wheels) Gait Pattern/deviations: Step-through pattern, Decreased step length - right, Decreased step length - left, Trunk flexed Gait velocity: decreased  General Gait Details: Pt tolerated ambulation ~ 220 ft with RW. no LOB or safety concerns however pt cued for improved posture throughout. gait posture is improving but still flexed.   Balance Overall balance assessment: Needs assistance Sitting-balance support: Single extremity supported, No upper extremity supported, Feet supported Sitting balance-Leahy Scale: Good     Standing balance support: Bilateral upper extremity supported, During functional activity, Single extremity supported Standing balance-Leahy Scale: Good    Cognition Arousal/Alertness: Awake/alert Behavior During Therapy: WFL for tasks assessed/performed Overall Cognitive Status: Within Functional Limits for tasks assessed              Pertinent Vitals/Pain Pain Assessment Pain Assessment: 0-10 Pain Score: 4  Pain Location: back Pain Descriptors / Indicators: Aching Pain Intervention(s): Limited activity within patient's tolerance, Monitored during session, Premedicated before session, Repositioned     PT Goals (current goals can now be found in the care plan section) Acute Rehab PT Goals Patient Stated Goal: go home Progress towards PT goals: Progressing toward goals    Frequency    Min 4X/week      PT Plan Current plan remains appropriate;Frequency needs to be updated    Co-evaluation     PT goals addressed during session: Mobility/safety with  mobility;Balance;Proper use of DME;Strengthening/ROM        AM-PAC PT "6 Clicks" Mobility  Outcome Measure  Help needed turning from your back to your side while in a flat bed without using bedrails?: A Little Help needed moving from lying on your back to sitting on the side of a flat bed without using bedrails?: A Little Help needed moving to and from a bed to a chair (including a wheelchair)?: A Little Help needed standing up from a chair using your arms (e.g., wheelchair or bedside chair)?: A Little Help needed to walk in hospital room?: A Little Help needed climbing 3-5 steps with a railing? : A Little 6 Click Score: 18    End of Session Equipment Utilized During Treatment: Back brace (TLSO) Activity Tolerance: Patient tolerated treatment well Patient left: in chair;with call bell/phone within reach;with chair alarm set;with family/visitor present Nurse Communication: Mobility status PT Visit Diagnosis: Muscle weakness (generalized) (M62.81);Pain;Difficulty in walking, not elsewhere classified (R26.2);Other abnormalities of gait and mobility (R26.89);Unsteadiness on feet (R26.81)     Time: 4098-1191 PT Time Calculation (min) (ACUTE ONLY): 15 min  Charges:  $Gait Training: 8-22 mins          Jetta Lout PTA 05/24/22, 2:40 PM

## 2022-05-24 NOTE — Discharge Summary (Addendum)
Physician Discharge Summary   Patient: Jamie [name] MRN: [id number] DOB: April 10, 1955  Admit date:     05/10/2022  Discharge date: 05/24/22  Discharge Physician: Pennie Banter   PCP: Judy Pimple, MD   Recommendations at discharge:   Follow up as scheduled with Neurosurgery Follow up as scheduled with Urology Follow up with Primary Care in 1-2 weeks Follow up on glycemic control.  Due to hyperglycemic during admission, pt discharged on 70/30 insulin and provided FreeStyle Libre CGM Repeat CBC, BMP in 1 week  Discharge Diagnoses: Principal Problem:   Compression fracture of L1 lumbar vertebra (HCC) Active Problems:   Fall at home, initial encounter   Essential hypertension   Ulcerative colitis (HCC)   DM (diabetes mellitus), type 2 with renal complications (HCC)   Hyperlipidemia associated with type 2 diabetes mellitus (HCC)   Chronic kidney disease, stage 3a (HCC)   Leukocytosis   OSA (obstructive sleep apnea)   Morbid obesity (HCC)   Closed tricolumnar fracture of lumbar vertebra (HCC)  Resolved Problems:   * No resolved hospital problems. [facility] Course: Per Dr. Fran Lowes:  "Jamie [name] is a 67 y.o. female with medical history significant of HTN, DM, CKD-3a, OSA on CPAP, morbid obesity, anemia, kidney stone, ulcerative colitis (s/p of colectomy and s/p ileostomy), who presented with fall and lower back pain.  Pt states that she accidentally fell in church, landing on her back."   Further hospital course and management as outlined below. In summary, hospital course complicated by acute urinary retention requiring Foley catheter, UTI, and AKI.    4/30 -- pt feels well.  Minimal back pain, tolerates TLSO brace for OOB activity.  She is agreeable to discharge this afternoon on PO antibiotics for UTI and close urology follow up on urinary retention. Tolerating Foley   Assessment and Plan:  Compression fracture of L1 lumbar vertebra:  s/p open reduction internal  fixation of T12 & L1 fractures & T11-L3 posterior arthrodesis as per neuro surg.  --Continue Brace while OOB for 6 weeks.  --outpatient f/u w/ neuro surg, Dr. Myer Haff.  --PT cleared pt to go home with [facility]   AKI 2/2 urinary retention  CKDIIIa Cr peaked at 7.52.  Bladder scan on 4/25 found >957ml post void.   Foley placed.  Cr improved after bladder decompression. Plan: --cont Foley for bladder decompression.   --Discharge with Foley --Outpatient voiding trial outpatient with urology.   Gross Hematuria, resolved --likely from bladder trauma from over-distention.   --Urology consulted Plan: --Outpatient cystoscopy in about 1 month    UTI due to Foley catheter Proteus in urine cx --urine cx from 4/29 pos for proteus.  Given hematuria and bladder trauma, will cover with abx. --Treated with empiric ceftriaxone  --Discharge on PO Duricef x 6 more days --Repeat CBC in 1 week to follow leukocytosis   DM2:  HbA1c 6.4 three months ago, however, has needed significant amount of insulin during hospitalization. Continue on SSI w/ accuchecks  --seen by diabetes educator --resume home regimen on d/c --Freestyle Libre provided --discharge on Novolog 70/30 - 13 units BID WC --close PCP follow up recommended   Acute metabolic acidosis, resolved S/p bicarb gtt   Fall:  at home. Fall precautions    HTN:  --cont Lopressor, later held 2/2 AKI, resume at d/c   Ulcerative colitis: s/p colectomy and ileostomy.  No acute issues    HLD:  continue on statin    Leukocytosis:  Reactive vs. UTI.  Afebrile Continue antibiotics for UTI as above Repeat CBC in 1 week   OSA:  continue on BiPAP    Morbid obesity: BMI 43.4.  Complicates overall care & prognosis    Hypomag --cont Mag-ox       Consultants: Neurosurgery, Urology, Nephrology Procedures performed:  T11-L3 posterior arthrodesis on 05/12/22  Disposition: Home health Diet recommendation:  Discharge Diet Orders (From  admission, onward)     Start     Ordered   05/24/22 0000  Diet - low sodium heart healthy        05/24/22 1513           Cardiac diet DISCHARGE MEDICATION: Allergies as of 05/24/2022       Reactions   Ciprofloxacin Rash   Erythema and pruritis around IV site, erythema and rash along vein   Other Rash   Jardiance [empagliflozin]    Increase in Cr    Lisinopril    REACTION: felt bad/ stomach hurt/ weak and tired   Metformin And Related    GI   Sulfa Antibiotics Rash        Medication List     STOP taking these medications    hyoscyamine 0.125 MG SL tablet Commonly known as: LEVSIN SL   zolpidem 10 MG tablet Commonly known as: AMBIEN       TAKE these medications    Accu-Chek Guide test strip Generic drug: glucose blood TO CHECK GLUCOSE DAILY AND AS NEEDED FOR TYPE 2 DIABETES   Accu-Chek Guide w/Device Kit To check glucose daily and as needed for type 2 diabetes   accu-chek multiclix lancets To check glucose daily and as needed for diabetes type 2   acetaminophen 325 MG tablet Commonly known as: TYLENOL Take 650 mg by mouth every 6 (six) hours as needed for moderate pain.   albuterol 108 (90 Base) MCG/ACT inhaler Commonly known as: VENTOLIN HFA Inhale 2 puffs into the lungs every 4 (four) hours as needed for wheezing or shortness of breath.   atorvastatin 10 MG tablet Commonly known as: LIPITOR TAKE 1/2 PILL BY MOUTH EVERY OTHER DAY What changed: See the new instructions.   cefadroxil 500 MG capsule Commonly known as: DURICEF Take 1 capsule (500 mg total) by mouth 2 (two) times daily for 6 days.   esomeprazole 20 MG capsule Commonly known as: NEXIUM Take 20 mg by mouth every other day.   fluticasone 50 MCG/ACT nasal spray Commonly known as: FLONASE Place 2 sprays into both nostrils at bedtime.   glipiZIDE 5 MG tablet Commonly known as: GLUCOTROL Take 1 tablet (5 mg total) by mouth daily before breakfast.   insulin aspart protamine -  aspart (70-30) 100 UNIT/ML FlexPen Commonly known as: NOVOLOG 70/30 MIX Inject 13 Units into the skin 2 (two) times daily with a meal.   losartan 25 MG tablet Commonly known as: COZAAR Take 25 mg by mouth daily.   magnesium oxide 400 (240 Mg) MG tablet Commonly known as: MAG-OX Take 2 tablets (800 mg total) by mouth 2 (two) times daily.   melatonin 1 MG Tabs tablet Take 1.5 mg by mouth at bedtime.   metoprolol tartrate 25 MG tablet Commonly known as: LOPRESSOR TAKE 1 TABLET BY MOUTH EVERY DAY   multivitamin tablet Take 1 tablet by mouth daily.   NON FORMULARY SLEEPS WITH BI-PAP   simethicone 80 MG chewable tablet Commonly known as: MYLICON Chew 1 tablet (80 mg total) by mouth 4 (four) times daily as needed for flatulence.  tirzepatide 5 MG/0.5ML Pen Commonly known as: MOUNJARO Inject 5 mg into the skin once a week.   Vitamin D3 25 MCG (1000 UT) Caps Take 1,000 Units by mouth daily.       ASK your doctor about these medications    oxyCODONE-acetaminophen 5-325 MG tablet Commonly known as: Percocet Take 1 tablet by mouth every 6 (six) hours as needed for up to 7 days for severe pain. Ask about: Should I take this medication?        Contact information for follow-up providers     [facility] at [facility]. Go to .   Specialty: Emergency Medicine Why: If symptoms worsen Contact information: [address] [id number] [place] [facility] [address] [phone]        Drake Leach, PA-C Follow up on 05/25/2022.   Specialty: Neurosurgery Contact information: [address] [place] [address] [phone]         Judy Pimple, MD. Call.   Specialties: Family Medicine, Radiology Why: Hospital follow up in 1-2 weeks Contact information: [address] [place] [address] [phone]         Carman Ching, PA-C. Go to.   Specialty: Urology Why: Appt scheduled  06/01/2022 at 3:30 PM Contact information: [address] [place] [address] [phone]         Venetia Night, MD. Call.   Specialty: Neurosurgery Contact information: [address] [place] [address] [phone]              Contact information for after-discharge care     Destination     [facility] [address] [place] [facility] Preferred SNF .   Service: Skilled Nursing Contact information: [address] [place] [address] [phone]                    Discharge Exam: Ceasar Mons Weights   05/11/22 1628 05/12/22 0701  Weight: 120.2 kg 120.2 kg   General exam: awake, alert, no acute distress HEENT: atraumatic, clear conjunctiva, anicteric sclera, moist mucus membranes, hearing grossly normal  Respiratory system: CTAB, no wheezes, rales or rhonchi, normal respiratory effort. Cardiovascular system: normal S1/S2, RRR, no pedal edema.   Gastrointestinal system: soft, NT, ND, no HSM felt, +bowel sounds. Genitourinary: Foley in place, no gross hematuria Central nervous system: A&O x 4. no gross focal neurologic deficits, normal speech Extremities: moves all, no edema, normal tone Skin: dry, intact, normal temperature, normal color, No rashes, lesions or ulcers Psychiatry: normal mood, congruent affect, judgement and insight appear normal   Condition at discharge: stable  The results of significant diagnostics from this hospitalization (including imaging, microbiology, ancillary and laboratory) are listed below for reference.   Imaging Studies: CT ABDOMEN PELVIS WO CONTRAST  Result Date: 05/19/2022 CLINICAL DATA:  Hydronephrosis, hematuria EXAM: CT ABDOMEN AND PELVIS WITHOUT CONTRAST TECHNIQUE: Multidetector CT imaging of the abdomen and pelvis was performed following the standard protocol without IV contrast. RADIATION DOSE REDUCTION: This exam was performed according to the departmental dose-optimization  program which includes automated exposure control, adjustment of the mA and/or kV according to patient size and/or use of iterative reconstruction technique. COMPARISON:  05/30/2021 FINDINGS: Lower chest: Bibasilar atelectasis. Cardiac size within normal limits. Trace pericardial effusion. Hepatobiliary: Cirrhosis. Stable capsular calcification along more since pouch. Status post cholecystectomy. No intra or extrahepatic biliary ductal dilation. Pancreas: Unremarkable Spleen: Unremarkable Adrenals/Urinary Tract: The adrenal glands are unremarkable. The kidneys are normal in  size and position. Mild asymmetric left hydronephrosis present which appears stable since prior examination and may reflect the sequela of remote distension 1 underlying mild UPJ obstruction. No hydronephrosis on the right. Two nonobstructing renal calculi are seen within the lower pole of the left kidney measuring 4 mm in dimension. No ureteral calculi. A a Foley catheter is seen looped within a decompressed bladder lumen. Stomach/Bowel: Surgical changes of a total proctocolectomy and right lower quadrant and ileostomy are identified. Small parastomal hernia, tiny periumbilical right parasagittal ventral hernia contain loops of unremarkable small bowel. The stomach and small bowel are otherwise unremarkable and there is no evidence of obstruction or focal inflammation. No free intraperitoneal gas or fluid. Stable infiltrative change within the presacral space, likely postsurgical in nature. Vascular/Lymphatic: Aortic atherosclerosis. No enlarged abdominal or pelvic lymph nodes. Reproductive: Status post hysterectomy. No adnexal masses. Other: Mild subcutaneous edema within the flanks bilaterally. Musculoskeletal: Acute to subacute fracture of the L1 vertebral body status post T11-L3 posterior thoracolumbar fusion with instrumentation. No other acute fracture identified. Degenerative changes are seen within the lumbar spine and hips bilaterally.  IMPRESSION: 1. No acute intra-abdominal pathology identified. No definite radiographic explanation for the patient's reported symptoms. 2. Stable mild asymmetric left hydronephrosis, possibly related to the sequela of remote distension or underlying mild UPJ obstruction. 3. Minimal left nonobstructing nephrolithiasis. 4. Cirrhosis. 5. Surgical changes of a total proctocolectomy and right lower quadrant and ileostomy. No evidence of obstruction. 6. Acute to subacute fracture of the L1 vertebral body status post T11-L3 posterior thoracolumbar fusion with instrumentation. 7. Aortic atherosclerosis. Aortic Atherosclerosis (ICD10-I70.0). Electronically Signed   By: Helyn Numbers M.D.   On: 05/19/2022 03:02   US RENAL  Result Date: 05/16/2022 CLINICAL DATA:  Acute renal failure EXAM: RENAL / URINARY TRACT ULTRASOUND COMPLETE COMPARISON:  CT abdomen pelvis contrast 05/30/2021 FINDINGS: Right Kidney: Renal measurements: 11.4 x 4.7 x 6.3 cm = volume: 176 mL. Echogenicity within normal limits. No mass. Mild hydronephrosis. Left Kidney: Renal measurements: 11.9 x 6.1 x 6.6 cm = volume: 249 mL. Echogenicity within normal limits. No mass. Mild hydronephrosis. 7 mm nonobstructing calculus of the lower pole. Bladder: Appears normal for degree of bladder distention. Other: None. IMPRESSION: 1. Mild bilateral hydronephrosis. 2. 8 mm nonobstructing left renal calculus. Electronically Signed   By: Acquanetta Belling M.D.   On: 05/16/2022 14:09   Abdomen 1 view (KUB)  Result Date: 05/14/2022 CLINICAL DATA:  kidney stones EXAM: ABDOMEN - 1 VIEW COMPARISON:  CT 05/30/2021 FINDINGS: Calcifications project over the left renal collecting system largest 4 mm. Cholecystectomy clips. Calcifications project in the right lower quadrant at the lower margin of the liver shadow suggesting dropped gallstones as before. Mild spondylitic changes in the lower lumbar spine. IMPRESSION: 1. Left nephrolithiasis. 2. Dropped gallstones as before.  Electronically Signed   By: Corlis Leak M.D.   On: 05/14/2022 09:42   DG THORACOLUMABAR SPINE  Result Date: 05/14/2022 CLINICAL DATA:  [id number] Postop check [id number] EXAM: THORACOLUMBAR SPINE 1V COMPARISON:  05/10/2022 FINDINGS: Bilateral pedicle screws T11, T12, L2, L3 with vertical interconnecting rods, intact without surrounding lucency. Mild compression deformity L1 vertebral body as before. Alignment is preserved. Aortic Atherosclerosis (ICD10-170.0). Surgical clips right abdomen. Fluid levels in nondilated bowel. IMPRESSION: 1. T11-L3 fusion without apparent complication. 2. Stable L1 compression deformity. Electronically Signed   By: Corlis Leak M.D.   On: 05/14/2022 09:38   DG Thoracic Spine 2 View  Result Date: 05/12/2022 CLINICAL DATA:  [id number] Spinal instability  [id number]. Intraoperative films of thoracic and lumbar fusion due to fracture. EXAM: THORACIC SPINE 2 VIEWS COMPARISON:  Thoracic spine radiographs 05/10/2022. FLUOROSCOPY: Exposure Index (as provided by the fluoroscopic device): 97.94 mGy Kerma. Fluoroscopy time: 1 minutes, 6 seconds. FINDINGS: Multiple intraoperative fluoroscopic spot images are provided without a radiologist present for the procedure described above. IMPRESSION: Multiple intraoperative fluoroscopic spot images are provided without a radiologist present for the procedure described above. Electronically Signed   By: Orvan Falconer M.D.   On: 05/12/2022 10:42   DG C-Arm 1-60 Min-No Report  Result Date: 05/12/2022 Fluoroscopy was utilized by the requesting physician.  No radiographic interpretation.   CT HEAD WO CONTRAST ( )  Result Date: 05/11/2022 CLINICAL DATA:  Fall EXAM: CT HEAD WITHOUT CONTRAST CT CERVICAL SPINE WITHOUT CONTRAST TECHNIQUE: Multidetector CT imaging of the head and cervical spine was performed following the standard protocol without intravenous contrast. Multiplanar CT image reconstructions of the cervical spine were also generated. RADIATION DOSE  REDUCTION: This exam was performed according to the departmental dose-optimization program which includes automated exposure control, adjustment of the mA and/or kV according to patient size and/or use of iterative reconstruction technique. COMPARISON:  CT head 01/09/2014, no prior CT cervical spine FINDINGS: CT HEAD FINDINGS Brain: No evidence of acute infarct, hemorrhage, mass, mass effect, or midline shift. No hydrocephalus or extra-axial fluid collection. Vascular: No hyperdense vessel. Skull: Negative for fracture or focal lesion. Sinuses/Orbits: No acute finding. Other: The mastoid air cells are well aerated. CT CERVICAL SPINE FINDINGS Alignment: No traumatic listhesis. Straightening of the normal cervical lordosis. Skull base and vertebrae: Evaluation of the lower cervical spine is somewhat limited by artifact related to overlapping soft tissues. Within this limitation, no acute fracture. No primary bone lesion or focal pathologic process. Prominent anterior osteophytes at C1 and C3-C6, which indents on the posterior aspect of the pharynx. Soft tissues and spinal canal: No prevertebral fluid or swelling. No visible canal hematoma. Disc levels: Degenerative changes in the cervical spine. No significant spinal canal stenosis. Upper chest: Negative. IMPRESSION: 1. No acute intracranial process. 2. No acute fracture or traumatic listhesis in the cervical spine. Electronically Signed   By: Wiliam Ke M.D.   On: 05/11/2022 11:56   CT CERVICAL SPINE WO CONTRAST  Result Date: 05/11/2022 CLINICAL DATA:  Fall EXAM: CT HEAD WITHOUT CONTRAST CT CERVICAL SPINE WITHOUT CONTRAST TECHNIQUE: Multidetector CT imaging of the head and cervical spine was performed following the standard protocol without intravenous contrast. Multiplanar CT image reconstructions of the cervical spine were also generated. RADIATION DOSE REDUCTION: This exam was performed according to the departmental dose-optimization program which includes  automated exposure control, adjustment of the mA and/or kV according to patient size and/or use of iterative reconstruction technique. COMPARISON:  CT head 01/09/2014, no prior CT cervical spine FINDINGS: CT HEAD FINDINGS Brain: No evidence of acute infarct, hemorrhage, mass, mass effect, or midline shift. No hydrocephalus or extra-axial fluid collection. Vascular: No hyperdense vessel. Skull: Negative for fracture or focal lesion. Sinuses/Orbits: No acute finding. Other: The mastoid air cells are well aerated. CT CERVICAL SPINE FINDINGS Alignment: No traumatic listhesis. Straightening of the normal cervical lordosis. Skull base and vertebrae: Evaluation of the lower cervical spine is somewhat limited by artifact related to overlapping soft tissues. Within this limitation, no acute fracture. No primary bone lesion or focal pathologic process. Prominent anterior osteophytes at C1 and C3-C6, which indents on the posterior aspect of the pharynx. Soft tissues and spinal canal: No prevertebral fluid or  swelling. No visible canal hematoma. Disc levels: Degenerative changes in the cervical spine. No significant spinal canal stenosis. Upper chest: Negative. IMPRESSION: 1. No acute intracranial process. 2. No acute fracture or traumatic listhesis in the cervical spine. Electronically Signed   By: Wiliam Ke M.D.   On: 05/11/2022 11:56   MR LUMBAR SPINE WO CONTRAST  Result Date: 05/11/2022 CLINICAL DATA:  Low back pain, trauma.  Fall. EXAM: MRI LUMBAR SPINE WITHOUT CONTRAST TECHNIQUE: Multiplanar, multisequence MR imaging of the lumbar spine was performed. No intravenous contrast was administered. COMPARISON:  Lumbar spine CT 05/10/2022 FINDINGS: Segmentation:  Standard. Alignment:  Trace anterolisthesis of L4 on L5. Vertebrae: Has shown on CT, there is an acute transverse fracture through the superior aspect of the L1 vertebral body which extends through the posterior elements bilaterally (pedicles and superior  articular processes) without significant displacement. There is only at most subtle anterior compression of the L1 vertebral body. No other lumbar fracture is identified. Conus medullaris and cauda equina: Conus extends to the upper L1 level. Conus and cauda equina appear normal. Paraspinal and other soft tissues: Unremarkable. Disc levels: Disc desiccation throughout the lumbar spine with largely preserved disc space heights. T12-L1: Mild facet hypertrophy without disc herniation or stenosis. L1-2: Mild disc bulging and mild facet hypertrophy without stenosis. L2-3: Mild disc bulging and moderate facet hypertrophy result in borderline to mild right lateral recess stenosis and moderate right neural foraminal stenosis without spinal stenosis. L3-4: Disc bulging and severe facet hypertrophy result in mild spinal stenosis, mild bilateral lateral recess stenosis, and mild right neural foraminal stenosis. L4-5: Anterolisthesis with mild bulging of uncovered disc and severe facet hypertrophy result in moderate spinal stenosis, moderate bilateral lateral recess stenosis, and minimal to mild right and mild-to-moderate left neural foraminal stenosis. L5-S1: Minimal disc bulging and moderate to severe right and mild-to-moderate left facet hypertrophy without significant stenosis. IMPRESSION: 1. Acute nondisplaced transverse fracture through the superior aspect of the L1 vertebral body and posterior elements. 2. Multilevel lumbar disc and facet degeneration, most notable at L4-5 where there is moderate spinal stenosis. Electronically Signed   By: Sebastian Ache M.D.   On: 05/11/2022 11:34   MR THORACIC SPINE WO CONTRAST  Result Date: 05/11/2022 CLINICAL DATA:  Back trauma, abnormal neuro exam, CT or xray positive (Age >= 16y). Fall. EXAM: MRI THORACIC SPINE WITHOUT CONTRAST TECHNIQUE: Multiplanar, multisequence MR imaging of the thoracic spine was performed. No intravenous contrast was administered. COMPARISON:  Thoracic  spine CT 05/10/2022 FINDINGS: Alignment:  Normal. Vertebrae: Minimal STIR hyperintensity/edema along the C7 and T8 superior endplates without a definite acute fracture when correlating with CT. Partial defect in bridging anterior osteophytes/syndesmophytes at T7-8 appears corticated/chronic on CT. No suspicious marrow lesion. Cord:  Normal signal and morphology. Paraspinal and other soft tissues: Unremarkable. Disc levels: Mild diffuse thoracic spondylosis and facet arthrosis without significant stenosis. IMPRESSION: Minimal edema type signal along the C7 and T8 superior endplates without a definite acute fracture when correlating with CT. Electronically Signed   By: Sebastian Ache M.D.   On: 05/11/2022 11:20   CT Thoracic Spine Wo Contrast  Result Date: 05/11/2022 CLINICAL DATA:  Mid and lower back pain following a fall onto her back down an unknown number of steps at church today. Age-indeterminate L1 compression fracture noted on thoracic spine series today. EXAM: CT THORACIC AND LUMBAR SPINE WITHOUT CONTRAST TECHNIQUE: Multidetector CT imaging of the thoracic and lumbar spine was performed without intravenous contrast. Multiplanar CT image reconstructions were also  generated. RADIATION DOSE REDUCTION: This exam was performed according to the departmental dose-optimization program which includes automated exposure control, adjustment of the mA and/or kV according to patient size and/or use of iterative reconstruction technique. COMPARISON:  Thoracic spine series today, CT abdomen and pelvis and reconstructed images 05/30/21. FINDINGS: CT THORACIC SPINE FINDINGS Segmentation: There are 12 rib-bearing thoracic segments, with osteopenia. There is a rudimentary cervical rib on the left at C7. Alignment: Within normal limits. Vertebrae: No acute fracture is evident. The spinous processes abut in the midline at most thoracic levels. There is intact bridging enthesopathy compatible with DISH throughout, with  degenerative discs with scattered disc space calcifications in the lower thoracic spine. Most of the posteromedial ribs are fused to the vertebrae and some are also fused to both the vertebrae and the transverse processes. Paraspinal and other soft tissues: There is no paraspinal hematoma or soft tissue injection. No canal hematoma is seen. There is cardiomegaly with coronary artery and aortic atherosclerosis. No pneumothorax is seen the visualized chest. Visualized lungs are clear as well as can be seen accounting for respiratory motion. Disc levels: The thoracic discs are degenerated particularly in the lower half of the thoracic spine. There are mild features of spondylosis. There are mild posterior disc osteophyte complexes at T6-7, T7-8, T8-9, T9-10, T10-11, and T11-12 but no mass effect on the cord surface or herniated discs are seen. There are multilevel facet spurring changes but no significant foraminal encroachment in the thoracic spine. CT LUMBAR SPINE FINDINGS Segmentation: 5 lumbar type vertebrae. Alignment: Normal except for minimal grade 1 degenerative anterolisthesis at L4-5, stable. Vertebrae: Osteopenia. There is an acute superior endplate anterior wedge compression fracture of L1 vertebral body. There is no posterior cortical retropulsion. Loss of anterior height is 25%.  No posterior height loss is seen. There is fragmentation along the anterior superior L1 body, and a transverse nondisplaced fracture line extending through the superior L1 body. The fracture continues posteriorly through both pedicles, and continuing dorsally through the superior articular facets of L1 and on the right also passes through the adjacent inferior articular facet of T12, with ankylosis of the facet joints previously noted. Other vertebrae do not show evidence of fractures. There are bridging osteophytes all levels except for L4-5, where there is attempted bridging. Paraspinal and other soft tissues: Aortic  atherosclerosis. No paraspinal hematoma. Nonobstructive nephrolithiasis on the left. Cluster of extruded gallstones are Morrison's pouch status post cholecystectomy. Coarse chronic presacral stranding changes. Disc levels: There is partial disc space loss once again at T12-L1 and L1-2. There are posterior disc osteophyte complexes at both levels without canal hematoma or significant spinal stenosis. At L1-2 posterior disc osteophyte complex again partially effaces the ventral CSF and there is mild facet hypertrophy without foraminal stenosis. L2-3, right-greater-than-left moderate facet hypertrophy is seen with encroachment on the right subarticular recess and mild spinal canal stenosis due to posterior osteophytes. Due to facet hypertrophy there is moderate severe right and mild-to-moderate left foraminal stenosis. L3-4 demonstrating moderate-to-severe dorsal facet hypertrophy and a posterior disc osteophyte complex and ligamentous thickening causing mild spinal canal encroachment. There is mild-to-moderate foraminal stenosis. More advanced facet hypertrophy L4-5 is seen with moderate to severe acquired spinal stenosis due to ligamentous and facet hypertrophy and dorsal disc osteophyte complex. There is bilateral mild-to-moderate foraminal stenosis. At L5-S1, moderate facet osteophytes and posterior disc osteophyte complex causing mild compression of the S1 nerve roots. There is moderate foraminal stenosis. IMPRESSION: 1. Osteopenia and degenerative change thoracic spine without  evidence of thoracic spine fractures or malalignment. 2. Cardiomegaly with aortic and coronary artery atherosclerosis. 3. Acute superior endplate anterior wedge compression fracture of L1 with 25% loss of anterior height but no posterior cortical retropulsion or paraspinal hematoma. 4. Transverse nondisplaced fracture through the superior L1 body extends through both pedicles and dorsally through the superior articular facet of L1 and on  the right also through the inferior articular facet of T12, with ankylosis of the facet joints seen previously. The posterior fractures are also nondisplaced. 5. Osteopenia and degenerative change without further appreciable lumbar spine fractures. 6. Nonobstructive left nephrolithiasis. 7. Cluster of extruded gallstones in Morison's pouch. Status post cholecystectomy. Aortic Atherosclerosis (ICD10-I70.0). Electronically Signed   By: Almira Bar M.D.   On: 05/11/2022 00:09   CT Lumbar Spine Wo Contrast  Result Date: 05/11/2022 CLINICAL DATA:  Mid and lower back pain following a fall onto her back down an unknown number of steps at church today. Age-indeterminate L1 compression fracture noted on thoracic spine series today. EXAM: CT THORACIC AND LUMBAR SPINE WITHOUT CONTRAST TECHNIQUE: Multidetector CT imaging of the thoracic and lumbar spine was performed without intravenous contrast. Multiplanar CT image reconstructions were also generated. RADIATION DOSE REDUCTION: This exam was performed according to the departmental dose-optimization program which includes automated exposure control, adjustment of the mA and/or kV according to patient size and/or use of iterative reconstruction technique. COMPARISON:  Thoracic spine series today, CT abdomen and pelvis and reconstructed images 05/30/21. FINDINGS: CT THORACIC SPINE FINDINGS Segmentation: There are 12 rib-bearing thoracic segments, with osteopenia. There is a rudimentary cervical rib on the left at C7. Alignment: Within normal limits. Vertebrae: No acute fracture is evident. The spinous processes abut in the midline at most thoracic levels. There is intact bridging enthesopathy compatible with DISH throughout, with degenerative discs with scattered disc space calcifications in the lower thoracic spine. Most of the posteromedial ribs are fused to the vertebrae and some are also fused to both the vertebrae and the transverse processes. Paraspinal and other soft  tissues: There is no paraspinal hematoma or soft tissue injection. No canal hematoma is seen. There is cardiomegaly with coronary artery and aortic atherosclerosis. No pneumothorax is seen the visualized chest. Visualized lungs are clear as well as can be seen accounting for respiratory motion. Disc levels: The thoracic discs are degenerated particularly in the lower half of the thoracic spine. There are mild features of spondylosis. There are mild posterior disc osteophyte complexes at T6-7, T7-8, T8-9, T9-10, T10-11, and T11-12 but no mass effect on the cord surface or herniated discs are seen. There are multilevel facet spurring changes but no significant foraminal encroachment in the thoracic spine. CT LUMBAR SPINE FINDINGS Segmentation: 5 lumbar type vertebrae. Alignment: Normal except for minimal grade 1 degenerative anterolisthesis at L4-5, stable. Vertebrae: Osteopenia. There is an acute superior endplate anterior wedge compression fracture of L1 vertebral body. There is no posterior cortical retropulsion. Loss of anterior height is 25%.  No posterior height loss is seen. There is fragmentation along the anterior superior L1 body, and a transverse nondisplaced fracture line extending through the superior L1 body. The fracture continues posteriorly through both pedicles, and continuing dorsally through the superior articular facets of L1 and on the right also passes through the adjacent inferior articular facet of T12, with ankylosis of the facet joints previously noted. Other vertebrae do not show evidence of fractures. There are bridging osteophytes all levels except for L4-5, where there is attempted bridging. Paraspinal and other  soft tissues: Aortic atherosclerosis. No paraspinal hematoma. Nonobstructive nephrolithiasis on the left. Cluster of extruded gallstones are Morrison's pouch status post cholecystectomy. Coarse chronic presacral stranding changes. Disc levels: There is partial disc space loss  once again at T12-L1 and L1-2. There are posterior disc osteophyte complexes at both levels without canal hematoma or significant spinal stenosis. At L1-2 posterior disc osteophyte complex again partially effaces the ventral CSF and there is mild facet hypertrophy without foraminal stenosis. L2-3, right-greater-than-left moderate facet hypertrophy is seen with encroachment on the right subarticular recess and mild spinal canal stenosis due to posterior osteophytes. Due to facet hypertrophy there is moderate severe right and mild-to-moderate left foraminal stenosis. L3-4 demonstrating moderate-to-severe dorsal facet hypertrophy and a posterior disc osteophyte complex and ligamentous thickening causing mild spinal canal encroachment. There is mild-to-moderate foraminal stenosis. More advanced facet hypertrophy L4-5 is seen with moderate to severe acquired spinal stenosis due to ligamentous and facet hypertrophy and dorsal disc osteophyte complex. There is bilateral mild-to-moderate foraminal stenosis. At L5-S1, moderate facet osteophytes and posterior disc osteophyte complex causing mild compression of the S1 nerve roots. There is moderate foraminal stenosis. IMPRESSION: 1. Osteopenia and degenerative change thoracic spine without evidence of thoracic spine fractures or malalignment. 2. Cardiomegaly with aortic and coronary artery atherosclerosis. 3. Acute superior endplate anterior wedge compression fracture of L1 with 25% loss of anterior height but no posterior cortical retropulsion or paraspinal hematoma. 4. Transverse nondisplaced fracture through the superior L1 body extends through both pedicles and dorsally through the superior articular facet of L1 and on the right also through the inferior articular facet of T12, with ankylosis of the facet joints seen previously. The posterior fractures are also nondisplaced. 5. Osteopenia and degenerative change without further appreciable lumbar spine fractures. 6.  Nonobstructive left nephrolithiasis. 7. Cluster of extruded gallstones in Morison's pouch. Status post cholecystectomy. Aortic Atherosclerosis (ICD10-I70.0). Electronically Signed   By: Almira Bar M.D.   On: 05/11/2022 00:09   DG Thoracic Spine 2 View  Result Date: 05/10/2022 CLINICAL DATA:  pain EXAM: THORACIC SPINE 2 VIEWS COMPARISON:  CT abdomen pelvis 05/30/2021. FINDINGS: There is no evidence of thoracic spine fracture. Interval development of an age-indeterminate L1 anterior wedge compression fracture. Multilevel mild degenerative changes spine. No other significant bone abnormalities are identified. IMPRESSION: 1. No acute displaced fracture or traumatic listhesis of the thoracic spine. 2. Interval development of an age-indeterminate L1 anterior wedge compression fracture. Recommend correlation with point tenderness to palpation to evaluate for an acute component. Electronically Signed   By: Tish Frederickson M.D.   On: 05/10/2022 21:42    Microbiology: Results for orders placed or performed during the hospital encounter of 05/10/22  Culture, blood (Routine X 2) w Reflex to ID Panel     Status: None   Collection Time: 05/18/22  2:12 PM   Specimen: BLOOD  Result Value Ref Range Status   Specimen Description BLOOD BLOOD LEFT FOREARM  Final   Special Requests   Final    BOTTLES DRAWN AEROBIC AND ANAEROBIC Blood Culture adequate volume   Culture   Final    NO GROWTH 5 DAYS Performed at [facility], [address] [place], [place] [address]    Report Status 05/23/2022 FINAL  Final  Culture, blood (Routine X 2) w Reflex to ID Panel     Status: None   Collection Time: 05/18/22  2:13 PM   Specimen: BLOOD  Result Value Ref Range Status   Specimen Description BLOOD RIGHT ANTECUBITAL  Final   Special Requests   Final    BOTTLES DRAWN AEROBIC AND ANAEROBIC Blood Culture adequate volume   Culture   Final    NO GROWTH 5 DAYS Performed at [facility], [address] [place], [place] [address]    Report Status 05/23/2022 FINAL  Final  Urine Culture (for pregnant, neutropenic or urologic patients or patients with an indwelling urinary catheter)     Status: Abnormal   Collection Time: 05/22/22  3:20 PM   Specimen: Urine, Catheterized  Result Value Ref Range Status   Specimen Description   Final    URINE, CATHETERIZED Performed at [facility], [address] [place], [place] [address]    Special Requests   Final    NONE Performed at [facility], [address] [place], [place] [address]    Culture >=100,000 COLONIES/mL PROTEUS MIRABILIS (A)  Final   Report Status 05/24/2022 FINAL  Final   Organism ID, Bacteria PROTEUS MIRABILIS (A)  Final      Susceptibility   Proteus mirabilis - MIC*    AMPICILLIN <=2 SENSITIVE Sensitive     CEFAZOLIN <=4 SENSITIVE Sensitive     CEFEPIME <=0.12 SENSITIVE Sensitive     CEFTRIAXONE <=0.25 SENSITIVE Sensitive     CIPROFLOXACIN <=0.25 SENSITIVE Sensitive     GENTAMICIN <=1 SENSITIVE Sensitive     IMIPENEM 2 SENSITIVE Sensitive     NITROFURANTOIN 128 RESISTANT Resistant     TRIMETH/SULFA <=20 SENSITIVE Sensitive     AMPICILLIN/SULBACTAM <=2 SENSITIVE Sensitive     PIP/TAZO <=4 SENSITIVE Sensitive     * >=100,000 COLONIES/mL PROTEUS MIRABILIS  Culture, blood (Routine X 2) w Reflex to ID Panel     Status: None (Preliminary result)   Collection Time: 05/23/22  6:30 PM   Specimen: BLOOD LEFT HAND  Result Value Ref Range Status   Specimen Description BLOOD LEFT HAND BLOOD LEFT HAND  Final   Special Requests   Final    BOTTLES DRAWN AEROBIC AND ANAEROBIC Blood Culture results may not be optimal due to an inadequate volume of blood received in culture bottles   Culture   Final    NO GROWTH < 24 HOURS Performed at [facility], [address] [place], [place] [address]    Report Status PENDING  Incomplete  Culture, blood (Routine X 2) w Reflex to ID Panel     Status: None  (Preliminary result)   Collection Time: 05/23/22 10:23 PM   Specimen: BLOOD LEFT HAND  Result Value Ref Range Status   Specimen Description BLOOD LEFT HAND  Final   Special Requests   Final    BOTTLES DRAWN AEROBIC AND ANAEROBIC Blood Culture adequate volume   Culture   Final    NO GROWTH < 12 HOURS Performed at [facility], [address] [place], [place] [address]    Report Status PENDING  Incomplete    Labs: CBC: Recent Labs  Lab 05/20/22 0549 05/21/22 0520 05/22/22 0350 05/23/22 0519 05/24/22 0529  WBC 8.8 13.2* 16.7* 15.2* 26.6*  HGB 9.7* 9.7* 9.2* 9.9* 10.0*  HCT 29.6* 29.5* 28.3* 30.4* 30.1*  MCV 96.1 96.7 97.9 96.8 95.6  PLT 226 238 265 284 356   Basic Metabolic Panel: Recent Labs  Lab 05/20/22 0549 05/21/22 0520 05/22/22 0350 05/23/22 0519 05/24/22 0529  NA 142 137 138 139 135  K 3.2* 3.7 3.2* 3.7 4.4  CL 103 101 102 104 103  CO2 32 27 30 26  24  GLUCOSE 175* 261* 150* 143* 178*  BUN 35* 25* 18 16 23   CREATININE 1.64* 1.50* 1.32* 1.24* 1.49*  CALCIUM 8.4* 8.4* 8.2* 8.4* 8.4*  MG 1.7 1.3* 1.4* 1.6* 1.7   Liver Function Tests: No results for input(s): "AST", "ALT", "ALKPHOS", "BILITOT", "PROT", "ALBUMIN" in the last 168 hours. CBG: Recent Labs  Lab 05/23/22 1145 05/23/22 1715 05/23/22 2154 05/24/22 0723 05/24/22 1124  GLUCAP 279* 143* 159* 166* 262*    Discharge time spent: greater than 30 minutes.  Signed: Pennie Banter, DO [facility] 05/24/2022

## 2022-05-24 NOTE — TOC Progression Note (Signed)
Transition of Care [facility]) - Progression Note    Patient Details  Name: Jamie [name] MRN: [id number] Date of Birth: [birth year]  [facility] CM/SW Contact  Marlowe Sax, RN Phone Number: 05/24/2022, 10:23 AM  Clinical Narrative:    The patient is set up with DME and HH, TOC will sign off, if any further needs please cosult again   Expected Discharge Plan: Home w Home Health Services Barriers to Discharge: Barriers Resolved  Expected Discharge Plan and Services   Discharge Planning Services: CM Consult Post Acute Care Choice: Home Health Living arrangements for the past 2 months: Single Family Home                 DME Arranged: N/A DME Agency: NA       HH Arranged: PT, OT HH Agency: [facility] Date [facility] Agency Contacted: 05/23/22 Time HH Agency Contacted: 1156 Representative spoke with at [facility] Agency: Kandee Keen   Social Determinants of Health (SDOH) Interventions SDOH Screenings   Food Insecurity: No Food Insecurity (05/13/2022)  Housing: Low Risk  (05/13/2022)  Transportation Needs: No Transportation Needs (05/13/2022)  Utilities: Not At Risk (05/13/2022)  Depression (PHQ2-9): Low Risk  (04/07/2022)  Financial Resource Strain: Low Risk  (05/31/2021)  Physical Activity: Inactive (05/31/2021)  Stress: No Stress Concern Present (05/31/2021)  Tobacco Use: Low Risk  (05/16/2022)    Readmission Risk Interventions     No data to display

## 2022-05-24 NOTE — Progress Notes (Signed)
Mobility Specialist - Progress Note   05/24/22 1305  Mobility  Activity Ambulated with assistance in hallway  Level of Assistance Modified independent, requires aide device or extra time  Assistive Device Front wheel walker  Distance Ambulated (ft) 240 ft  Activity Response Tolerated well  Mobility Referral Yes  $Mobility charge 1 Mobility   Pt sitting in recliner on RA upon arrival. Pt dons brace MinA with VC. Pt STS and ambulates in hallway ModI. Pt returns to recliner with needs in reach and sister in room.   Terrilyn Saver  Mobility Specialist  05/24/22 1:07 PM

## 2022-05-25 ENCOUNTER — Telehealth: Payer: Self-pay | Admitting: *Deleted

## 2022-05-25 ENCOUNTER — Encounter: Payer: Medicare PPO | Admitting: Orthopedic Surgery

## 2022-05-25 LAB — CULTURE, BLOOD (ROUTINE X 2)

## 2022-05-25 NOTE — Progress Notes (Addendum)
   REFERRING PHYSICIAN:  Judy Pimple, Md [address] [place],  [place] [address]  DOS: 05/12/22 T11-L3 posterior instrumentation with ORIF of T12 and L1 fractures  HISTORY OF PRESENT ILLNESS: Jamie [name] is approximately 2 weeks status post T11-L3 posterior instrumentation with ORIF T12 and L1 fractures. Was given percocet 5 on discharge from the hospital. She went home on 05/24/22.   She still has numbness in left lateral thigh. She notes some radiating pain  in left thigh when she starts to walk, but it improves after a few steps. Minimal back pain.   She is taking prn percocet and tylenol. Does not need refill.   She still has foley catheter- has f/u with urology tomorrow to hopefully get it removed. She has an ostomy bag as well.   She is not wearing a brace- has not been wearing since she came home as she has not been walking much.   HHPT is starting today.    PHYSICAL EXAMINATION:  General: Patient is well developed, well nourished, calm, collected, and in no apparent distress.   NEUROLOGICAL:  General: In no acute distress.   Awake, alert, oriented to person, place, and time.  Pupils equal round and reactive to light.  Facial tone is symmetric.     Strength:          Side Iliopsoas Quads Hamstring PF DF EHL  R 5 5 5 5 5 5   L 5 5 5 5 5 5    Incisions are c/d/i  She has some numbness in left thigh in distribution of LFCN.   ROS (Neurologic):  Negative except as noted above  IMAGING: Nothing new to review.   ASSESSMENT/PLAN:  Jamie [name] is doing reasonably well s/p above surgery. Treatment options reviewed with patient and following plan made:   - Reviewed wound care.  - No bending, twisting, or lifting.  - Continue on current medications including prn percocet. No more than 3-4 grams of tylenol per day.  - Follow up as scheduled in 4 weeks and prn. Will need xrays prior to this visit.   Reviewed with Dr. Myer Haff after her visit and called her-  he prefers she wear brace when up and walking. Will watch left thigh numbness. This should improve. If not, may have her see Dr. Katrinka Blazing.   Advised to contact the office if any questions or concerns arise.  Drake Leach PA-C Department of neurosurgery

## 2022-05-25 NOTE — Telephone Encounter (Signed)
Thanks for the heads up  We will likely do labs that day but not before

## 2022-05-25 NOTE — Transitions of Care (Post Inpatient/ED Visit) (Signed)
05/25/2022  Name: Jamie [name] MRN: [id number] DOB: 09-05-1955  Today's TOC FU Call Status: Today's TOC FU Call Status:: Successful TOC FU Call Competed TOC FU Call Complete Date: 05/25/22  Transition Care Management Follow-up Telephone Call Date of Discharge: 05/24/22 Discharge Facility: [facility]) Type of Discharge: Inpatient Admission Primary Inpatient Discharge Diagnosis:: Compression fracture of L1 lumbar vertebra (HCC How have you been since you were released from the hospital?: Better Any questions or concerns?: Yes Patient Questions/Concerns:: Patient is started on insulin and wanted to know about getting needles for the flex pen. She want to know does she need to get labs done before seeing DR. Patient Questions/Concerns Addressed: Notified Provider of Patient Questions/Concerns  Items Reviewed: Did you receive and understand the discharge instructions provided?: Yes Medications obtained,verified, and reconciled?: Yes (Medications Reviewed) Any new allergies since your discharge?: No Dietary orders reviewed?: No Do you have support at home?: Yes Name of Support/Comfort Primary Source: [place]  Medications Reviewed Today: Medications Reviewed Today     Reviewed by Luella Cook, RN (Case Manager) on 05/25/22 at 1211  Med List Status: <None>   Medication Order Taking? Sig Documenting Provider Last Dose Status Informant  ACCU-CHEK GUIDE test strip [id number] Yes TO CHECK GLUCOSE DAILY AND AS NEEDED FOR TYPE 2 DIABETES Tower, Audrie Gallus, MD Taking Active Self  acetaminophen (TYLENOL) 325 MG tablet [id number] Yes Take 650 mg by mouth every 6 (six) hours as needed for moderate pain. [provider] Taking Active Self  albuterol (VENTOLIN HFA) 108 (90 Base) MCG/ACT inhaler [id number] Yes Inhale 2 puffs into the lungs every 4 (four) hours as needed for wheezing or shortness of breath. Erin Fulling, MD Taking Active Self  atorvastatin  (LIPITOR) 10 MG tablet [id number]  TAKE 1/2 PILL BY MOUTH EVERY OTHER DAY  Patient taking differently: Take 5 mg by mouth every other day.   Tower, Audrie Gallus, MD  Active Self  Blood Glucose Monitoring Suppl (ACCU-CHEK GUIDE) w/Device Andria Rhein [id number] Yes To check glucose daily and as needed for type 2 diabetes Tower, Audrie Gallus, MD Taking Active Self  cefadroxil (DURICEF) 500 MG capsule [id number] Yes Take 1 capsule (500 mg total) by mouth 2 (two) times daily for 6 days. Pennie Banter, DO Taking Active   Cholecalciferol (VITAMIN D3) 25 MCG (1000 UT) CAPS [id number] Yes Take 1,000 Units by mouth daily. [provider] Taking Active Self  esomeprazole (NEXIUM) 20 MG capsule [id number] Yes Take 20 mg by mouth every other day. [provider] Taking Active Self  fluticasone (FLONASE) 50 MCG/ACT nasal spray [id number] Yes Place 2 sprays into both nostrils at bedtime. [provider] Taking Active Self  glipiZIDE (GLUCOTROL) 5 MG tablet [id number] Yes Take 1 tablet (5 mg total) by mouth daily before breakfast. Tower, Audrie Gallus, MD Taking Active Self  insulin aspart protamine - aspart (NOVOLOG 70/30 MIX) (70-30) 100 UNIT/ML FlexPen [id number] Yes Inject 13 Units into the skin 2 (two) times daily with a meal. Pennie Banter, DO Taking Active   Lancets (ACCU-CHEK MULTICLIX) lancets [id number] Yes To check glucose daily and as needed for diabetes type 2 Tower, Audrie Gallus, MD Taking Active Self  losartan (COZAAR) 25 MG tablet [id number] Yes Take 25 mg by mouth daily. [provider] Taking Active Self  magnesium oxide (MAG-OX) 400 (240 Mg) MG tablet [id number] Yes Take 2 tablets (800 mg total) by mouth 2 (two) times daily. Esaw Grandchild A, DO Taking Active   melatonin 1  MG TABS tablet [id number] Yes Take 1.5 mg by mouth at bedtime. [provider] Taking Active Self  metoprolol tartrate (LOPRESSOR) 25 MG tablet [id number] Yes TAKE 1 TABLET BY MOUTH EVERY DAY Tower, Audrie Gallus, MD Taking  Active   Multiple Vitamin (MULTIVITAMIN) tablet [id number] Yes Take 1 tablet by mouth daily. [provider] Taking Active Self  Clent Demark [id number] Yes SLEEPS WITH BI-PAP [provider] Taking Active Self  simethicone (MYLICON) 80 MG chewable tablet [id number] Yes Chew 1 tablet (80 mg total) by mouth 4 (four) times daily as needed for flatulence. Pennie Banter, DO Taking Active   tirzepatide [facility]) 5 MG/0.5ML Pen [id number] Yes Inject 5 mg into the skin once a week. Tower, Audrie Gallus, MD Taking Active Self            Home Care and Equipment/Supplies: Were Home Health Services Ordered?: NA Any new equipment or medical supplies ordered?: NA  Functional Questionnaire: Do you need assistance with bathing/showering or dressing?: Yes Do you need assistance with meal preparation?: Yes Do you need assistance with eating?: No Do you have difficulty maintaining continence: No Do you need assistance with getting out of bed/getting out of a chair/moving?: Yes Do you have difficulty managing or taking your medications?: No  Follow up appointments reviewed: PCP Follow-up appointment confirmed?: Yes Date of PCP follow-up appointment?: 05/30/22 Follow-up Provider: Dr Idamae Schuller [facility] Follow-up appointment confirmed?: Yes Date of Specialist follow-up appointment?: 05/29/22 Follow-Up Specialty Provider:: [id number] Drake Leach Pa 9:30, [id number] [facility] [place] 9:30 and :3:30, Dr Marcell Barlow [id number] 11:30 Do you need transportation to your follow-up appointment?: No Do you understand care options if your condition(s) worsen?: Yes-patient verbalized understanding  SDOH Interventions Today    Flowsheet Row Most Recent Value  SDOH Interventions   Food Insecurity Interventions Intervention Not Indicated  Housing Interventions Intervention Not Indicated  Transportation Interventions Intervention Not Indicated      Interventions Today    Flowsheet Row  Most Recent Value  General Interventions   General Interventions Discussed/Reviewed General Interventions Discussed, General Interventions Reviewed, Doctor Visits  Doctor Visits Discussed/Reviewed Doctor Visits Discussed, Doctor Visits Reviewed  Education Interventions   Education Provided Provided Education  [discussed using the free style Libre]  Provided Verbal Education On Blood Sugar Monitoring  Pharmacy Interventions   Pharmacy Dicussed/Reviewed Pharmacy Topics Discussed, Pharmacy Topics Reviewed  [Discussed her insulin]      TOC Interventions Today    Flowsheet Row Most Recent Value  TOC Interventions   TOC Interventions Discussed/Reviewed TOC Interventions Discussed, TOC Interventions Reviewed, Arranged PCP follow up within 7 days/Care Guide scheduled  [Discussed usining incentive spironmetry]      Gean Maidens BSN RN [facility] [phone]

## 2022-05-25 NOTE — Telephone Encounter (Signed)
Pt.notified

## 2022-05-26 LAB — CULTURE, BLOOD (ROUTINE X 2)

## 2022-05-28 LAB — CULTURE, BLOOD (ROUTINE X 2)
Culture: NO GROWTH
Special Requests: ADEQUATE

## 2022-05-29 ENCOUNTER — Ambulatory Visit (INDEPENDENT_AMBULATORY_CARE_PROVIDER_SITE_OTHER): Payer: Medicare PPO | Admitting: Orthopedic Surgery

## 2022-05-29 ENCOUNTER — Encounter: Payer: Self-pay | Admitting: Orthopedic Surgery

## 2022-05-29 VITALS — BP 111/69 | HR 76 | Temp 97.9°F | Ht 65.0 in | Wt 264.0 lb

## 2022-05-29 DIAGNOSIS — M532X6 Spinal instabilities, lumbar region: Secondary | ICD-10-CM

## 2022-05-29 DIAGNOSIS — E1122 Type 2 diabetes mellitus with diabetic chronic kidney disease: Secondary | ICD-10-CM | POA: Diagnosis not present

## 2022-05-29 DIAGNOSIS — I2510 Atherosclerotic heart disease of native coronary artery without angina pectoris: Secondary | ICD-10-CM | POA: Diagnosis not present

## 2022-05-29 DIAGNOSIS — Z09 Encounter for follow-up examination after completed treatment for conditions other than malignant neoplasm: Secondary | ICD-10-CM

## 2022-05-29 DIAGNOSIS — S32010D Wedge compression fracture of first lumbar vertebra, subsequent encounter for fracture with routine healing: Secondary | ICD-10-CM | POA: Diagnosis not present

## 2022-05-29 DIAGNOSIS — I1310 Hypertensive heart and chronic kidney disease without heart failure, with stage 1 through stage 4 chronic kidney disease, or unspecified chronic kidney disease: Secondary | ICD-10-CM | POA: Diagnosis not present

## 2022-05-29 DIAGNOSIS — B964 Proteus (mirabilis) (morganii) as the cause of diseases classified elsewhere: Secondary | ICD-10-CM | POA: Diagnosis not present

## 2022-05-29 DIAGNOSIS — N1831 Chronic kidney disease, stage 3a: Secondary | ICD-10-CM | POA: Diagnosis not present

## 2022-05-29 DIAGNOSIS — M4325 Fusion of spine, thoracolumbar region: Secondary | ICD-10-CM

## 2022-05-29 DIAGNOSIS — W109XXD Fall (on) (from) unspecified stairs and steps, subsequent encounter: Secondary | ICD-10-CM

## 2022-05-29 DIAGNOSIS — S32018D Other fracture of first lumbar vertebra, subsequent encounter for fracture with routine healing: Secondary | ICD-10-CM

## 2022-05-29 DIAGNOSIS — S22089S Unspecified fracture of T11-T12 vertebra, sequela: Secondary | ICD-10-CM

## 2022-05-29 DIAGNOSIS — N179 Acute kidney failure, unspecified: Secondary | ICD-10-CM | POA: Diagnosis not present

## 2022-05-29 DIAGNOSIS — S22088D Other fracture of T11-T12 vertebra, subsequent encounter for fracture with routine healing: Secondary | ICD-10-CM

## 2022-05-29 DIAGNOSIS — N136 Pyonephrosis: Secondary | ICD-10-CM | POA: Diagnosis not present

## 2022-05-29 DIAGNOSIS — S32012S Unstable burst fracture of first lumbar vertebra, sequela: Secondary | ICD-10-CM

## 2022-05-29 DIAGNOSIS — D631 Anemia in chronic kidney disease: Secondary | ICD-10-CM | POA: Diagnosis not present

## 2022-05-29 NOTE — Patient Instructions (Addendum)
It was nice to see you today.   I am glad that you are feeling better!   Okay to get incision wet in the shower, do not submerge in pool or hot tub.   Call if any concerns about the incision such as redness, drainage, or fever/chills.   Try to avoid bending, twisting, or lifting. You can lift up to 10 pounds until your follow up with Dr.  Myer Haff in 4 weeks.   You have percocet at home. Continue to take the least amount needed and only take for severe pain. Remember this medication can make you sleepy and/or constipated.   Remember to take no more than 3000-4000mg  of tylenol per day. You need to be sure to count the tylenol in the percocet as well as the regular tylenol.   We will see you back in 3-4 weeks for your 6 weeks postop visit. You will need to get xrays prior to this visit. I would show up an hour before your visit with Dr. Myer Haff.   Please call with any questions or concerns.   Drake Leach PA-C [phone]

## 2022-05-30 ENCOUNTER — Ambulatory Visit: Payer: Medicare PPO | Admitting: Physician Assistant

## 2022-05-30 ENCOUNTER — Ambulatory Visit: Payer: Medicare PPO | Admitting: Family Medicine

## 2022-05-30 ENCOUNTER — Encounter: Payer: Self-pay | Admitting: Family Medicine

## 2022-05-30 VITALS — BP 126/60 | HR 71 | Temp 97.8°F | Ht 65.0 in | Wt 250.5 lb

## 2022-05-30 DIAGNOSIS — I2510 Atherosclerotic heart disease of native coronary artery without angina pectoris: Secondary | ICD-10-CM | POA: Diagnosis not present

## 2022-05-30 DIAGNOSIS — I1310 Hypertensive heart and chronic kidney disease without heart failure, with stage 1 through stage 4 chronic kidney disease, or unspecified chronic kidney disease: Secondary | ICD-10-CM | POA: Diagnosis not present

## 2022-05-30 DIAGNOSIS — N1832 Chronic kidney disease, stage 3b: Secondary | ICD-10-CM

## 2022-05-30 DIAGNOSIS — N179 Acute kidney failure, unspecified: Secondary | ICD-10-CM | POA: Diagnosis not present

## 2022-05-30 DIAGNOSIS — R339 Retention of urine, unspecified: Secondary | ICD-10-CM

## 2022-05-30 DIAGNOSIS — Z7985 Long-term (current) use of injectable non-insulin antidiabetic drugs: Secondary | ICD-10-CM | POA: Diagnosis not present

## 2022-05-30 DIAGNOSIS — D631 Anemia in chronic kidney disease: Secondary | ICD-10-CM | POA: Diagnosis not present

## 2022-05-30 DIAGNOSIS — B3731 Acute candidiasis of vulva and vagina: Secondary | ICD-10-CM

## 2022-05-30 DIAGNOSIS — S32012S Unstable burst fracture of first lumbar vertebra, sequela: Secondary | ICD-10-CM | POA: Diagnosis not present

## 2022-05-30 DIAGNOSIS — R310 Gross hematuria: Secondary | ICD-10-CM | POA: Diagnosis not present

## 2022-05-30 DIAGNOSIS — N201 Calculus of ureter: Secondary | ICD-10-CM

## 2022-05-30 DIAGNOSIS — S32010D Wedge compression fracture of first lumbar vertebra, subsequent encounter for fracture with routine healing: Secondary | ICD-10-CM | POA: Diagnosis not present

## 2022-05-30 DIAGNOSIS — I10 Essential (primary) hypertension: Secondary | ICD-10-CM

## 2022-05-30 DIAGNOSIS — E1122 Type 2 diabetes mellitus with diabetic chronic kidney disease: Secondary | ICD-10-CM

## 2022-05-30 DIAGNOSIS — D72829 Elevated white blood cell count, unspecified: Secondary | ICD-10-CM

## 2022-05-30 DIAGNOSIS — N1831 Chronic kidney disease, stage 3a: Secondary | ICD-10-CM | POA: Diagnosis not present

## 2022-05-30 DIAGNOSIS — S32009K Unspecified fracture of unspecified lumbar vertebra, subsequent encounter for fracture with nonunion: Secondary | ICD-10-CM | POA: Diagnosis not present

## 2022-05-30 DIAGNOSIS — N136 Pyonephrosis: Secondary | ICD-10-CM | POA: Diagnosis not present

## 2022-05-30 DIAGNOSIS — B964 Proteus (mirabilis) (morganii) as the cause of diseases classified elsewhere: Secondary | ICD-10-CM | POA: Diagnosis not present

## 2022-05-30 LAB — CBC WITH DIFFERENTIAL/PLATELET
Basophils Absolute: 0.1 10*3/uL (ref 0.0–0.1)
Basophils Relative: 0.9 % (ref 0.0–3.0)
Eosinophils Absolute: 0.3 10*3/uL (ref 0.0–0.7)
Eosinophils Relative: 2.2 % (ref 0.0–5.0)
HCT: 34 % — ABNORMAL LOW (ref 36.0–46.0)
Hemoglobin: 11.3 g/dL — ABNORMAL LOW (ref 12.0–15.0)
Lymphocytes Relative: 18.6 % (ref 12.0–46.0)
Lymphs Abs: 2.8 10*3/uL (ref 0.7–4.0)
MCHC: 33.4 g/dL (ref 30.0–36.0)
MCV: 97 fl (ref 78.0–100.0)
Monocytes Absolute: 1.1 10*3/uL — ABNORMAL HIGH (ref 0.1–1.0)
Monocytes Relative: 7.1 % (ref 3.0–12.0)
Neutro Abs: 10.8 10*3/uL — ABNORMAL HIGH (ref 1.4–7.7)
Neutrophils Relative %: 71.2 % (ref 43.0–77.0)
Platelets: 493 10*3/uL — ABNORMAL HIGH (ref 150.0–400.0)
RBC: 3.5 Mil/uL — ABNORMAL LOW (ref 3.87–5.11)
RDW: 14.1 % (ref 11.5–15.5)
WBC: 15.1 10*3/uL — ABNORMAL HIGH (ref 4.0–10.5)

## 2022-05-30 LAB — BASIC METABOLIC PANEL
BUN: 23 mg/dL (ref 6–23)
CO2: 23 mEq/L (ref 19–32)
Calcium: 8.9 mg/dL (ref 8.4–10.5)
Chloride: 100 mEq/L (ref 96–112)
Creatinine, Ser: 1.53 mg/dL — ABNORMAL HIGH (ref 0.40–1.20)
GFR: 34.88 mL/min — ABNORMAL LOW (ref >60.00)
Glucose, Bld: 257 mg/dL — ABNORMAL HIGH (ref 70–99)
Potassium: 5.1 mEq/L (ref 3.5–5.1)
Sodium: 132 mEq/L — ABNORMAL LOW (ref 135–145)

## 2022-05-30 LAB — BLADDER SCAN AMB NON-IMAGING: Scan Result: 284

## 2022-05-30 MED ORDER — FLUCONAZOLE 150 MG PO TABS
150.0000 mg | ORAL_TABLET | Freq: Once | ORAL | 0 refills | Status: AC
Start: 2022-05-30 — End: 2022-05-30

## 2022-05-30 MED ORDER — ZOLPIDEM TARTRATE 10 MG PO TABS: 5.0000 mg | ORAL_TABLET | Freq: Every evening | ORAL | 1 refills | Status: DC | PRN

## 2022-05-30 NOTE — Assessment & Plan Note (Signed)
Pt is getting back to pre hospital diet  PT for exercise while getting strength back   Will start back on mounjaro

## 2022-05-30 NOTE — Assessment & Plan Note (Signed)
Re check cbc today  Was during stress and infection

## 2022-05-30 NOTE — Progress Notes (Signed)
Catheter Removal  Patient is present today for a catheter removal.  8ml of water was drained from the balloon. A 14FR foley cath was removed from the bladder, no complications were noted. Patient tolerated well.  Performed by: Mammie Lorenzo, RN  Follow up/ Additional notes: this afternoon

## 2022-05-30 NOTE — Assessment & Plan Note (Signed)
bp in fair control at this time (after hospitalization) BP Readings from Last 1 Encounters:  05/30/22 126/60   No changes needed Most recent labs reviewed  Disc lifstyle change with low sodium diet and exercise  Plan to continue  Losartan 25 mg daily  Metoprolol 25 mg daily  Nephrology f/u in feb

## 2022-05-30 NOTE — Patient Instructions (Addendum)
Do not mix ambien with pain medicine  Use sparingly and only as needed   Caution of sedation and falls with this    Try to stick to diabetic diet the best you can  Go back on your mounjaro and hold insulin We can add it back if glucose goes up  Continue to hold glipizide for now   Continue other medicines   I will refer you to the pharmacy team I put the referral in  Please let us know if you don't hear in 1-2 weeks   When you are down to 1 free style Josephine Igo let us know so we can order   Labs today   See you in June unless needed earlier

## 2022-05-30 NOTE — Progress Notes (Signed)
Subjective:    Patient ID: Jamie [name], female    DOB: 1955-07-10, 67 y.o.   MRN: [id number]  HPI Pt presents for f/u of hospitalization for comp fx of multiple vertebrae (incl L1 burst fx)  Wt Readings from Last 3 Encounters:  05/30/22 250 lb 8 oz (113.6 kg)  05/29/22 264 lb (119.7 kg)  05/12/22 264 lb 15.9 oz (120.2 kg)   41.69 kg/m  Vitals:   05/30/22 1024  BP: 126/60  Pulse: 71  Temp: 97.8 F (36.6 C)  SpO2: 97%    Hosp from 4/17 to 05/24/22 Presented after accidental fall on [place] in [place] (fell in dark going down stairs)  Needed surgery  Complicated by urinary retention/ uti and foley - then AKI   Hosp course as follows Hospital Course: Per Dr. Fran Lowes:  "Jamie [name] is a 67 y.o. female with medical history significant of HTN, DM, CKD-3a, OSA on CPAP, morbid obesity, anemia, kidney stone, ulcerative colitis (s/p of colectomy and s/p ileostomy), who presented with fall and lower back pain.  Pt states that she accidentally fell in [place], landing on her back."     Further hospital course and management as outlined below. In summary, hospital course complicated by acute urinary retention requiring Foley catheter, UTI, and AKI.     4/30 -- pt feels well.  Minimal back pain, tolerates TLSO brace for OOB activity.  She is agreeable to discharge this afternoon on PO antibiotics for UTI and close urology follow up on urinary retention. Tolerating Foley    Assessment and Plan:   Compression fracture of L1 lumbar vertebra:  s/p open reduction internal fixation of T12 & L1 fractures & T11-L3 posterior arthrodesis as per neuro surg.  --Continue Brace while OOB for 6 weeks.  --outpatient f/u w/ neuro surg, Dr. Myer Haff.  (Had f/u yesterday)  --PT cleared pt to go home with [facility]  Overall feels better  Not currently using brace / they want her to use brace when up and about     AKI 2/2 urinary retention  CKDIIIa Cr peaked at 7.52.  Bladder scan on 4/25 found >951ml  post void.   Foley placed.  Cr improved after bladder decompression. Plan: --cont Foley for bladder decompression.   --Discharge with Foley --Outpatient voiding trial outpatient with urology.  Removed foley this am -has not urinated yet  Finished abx this am     Gross Hematuria, resolved --likely from bladder trauma from over-distention.   --Urology consulted Plan: --Outpatient cystoscopy in about 1 month    Proteus in urine cx --urine cx from 4/29 pos for proteus.  Given hematuria and bladder trauma, will cover with abx. --Treated with empiric ceftriaxone  --Discharge on PO Duricef x 6 more days --Repeat CBC in 1 week to follow leukocytosis   DM2:  HbA1c 6.4 three months ago, however, has needed significant amount of insulin during hospitalization. Continue on SSI w/ accuchecks  --seen by diabetes educator --resume home regimen on d/c --Freestyle Libre provided --discharge on Novolog 70/30 - 13 units BID WC --close PCP follow up recommended  They sent her home on insuln  13 u of 70/30 twice daily  Started dexcom also  Blood sugar is still running high for her 250s-300s Lowest 130   Taking percocet for pain  Ambien 1/2 pill as needed for sleep  Using bipap     Acute metabolic acidosis, resolved S/p bicarb gtt   Fall:  at home. Fall precautions   Using walker  now    HTN:  --cont Lopressor, later held 2/2 AKI, resume at d/c  BP: 126/60     Ulcerative colitis: s/p colectomy and ileostomy.  No acute issues    HLD:  continue on statin    Leukocytosis:  Reactive vs. UTI.  Afebrile Continue antibiotics for UTI as above Repeat CBC in 1 week   OSA:  continue on BiPAP    Morbid obesity: BMI 43.4.  Complicates overall care & prognosis    Hypomag --cont Mag-ox  Has planned f/u with neuro surg and urology   Last metabolic panel Lab Results  Component Value Date   GLUCOSE 178 (H) 05/24/2022   NA 135 05/24/2022   K 4.4 05/24/2022   CL 103  05/24/2022   CO2 24 05/24/2022   BUN 23 05/24/2022   CREATININE 1.49 (H) 05/24/2022   GFRNONAA 38 (L) 05/24/2022   CALCIUM 8.4 (L) 05/24/2022   PHOS 3.3 07/26/2014   PROT 7.1 01/31/2022   ALBUMIN 3.9 01/31/2022   LABGLOB 3.3 04/28/2021   BILITOT 0.6 01/31/2022   ALKPHOS 59 01/31/2022   AST 28 01/31/2022   ALT 16 01/31/2022   ANIONGAP 8 05/24/2022   Lab Results  Component Value Date   WBC 26.6 (H) 05/24/2022   HGB 10.0 (L) 05/24/2022   HCT 30.1 (L) 05/24/2022   MCV 95.6 05/24/2022   PLT 356 05/24/2022   .last dexa 2021-normal   PT coming in  Will have OT  Patient Active Problem List   Diagnosis Date Noted   Closed tricolumnar fracture of lumbar vertebra (HCC) 05/12/2022   Compression fracture of L1 lumbar vertebra (HCC) 05/11/2022   Morbid obesity (HCC) 05/11/2022   Fall at home, initial encounter 05/11/2022   Chronic kidney disease, stage 3a (HCC) 05/11/2022   Closed fracture of first lumbar vertebra (HCC) 05/11/2022   Closed fracture of twelfth thoracic vertebra (HCC) 05/11/2022   Spinal instability, lumbar 05/11/2022   Leukocytosis 05/11/2022   Hearing loss, right 04/07/2022   Sinus congestion 04/07/2022   Abnormal SPEP 04/28/2021   Abnormal EKG 04/26/2021   Ileostomy dysfunction (HCC) 08/31/2020   CKD (chronic kidney disease) stage 3, GFR 30-59 ml/min (HCC) 05/31/2020   Aortic atherosclerosis (HCC) 03/01/2020   Urinary frequency 12/23/2019   Elevated serum creatinine 05/14/2019   Dermatitis 07/01/2018   Left shoulder pain 11/30/2017   Palpitations 07/05/2017   Ostomy nurse consultation 06/05/2017   Lichen planus 04/09/2017   Hyperlipidemia associated with type 2 diabetes mellitus (HCC) 04/10/2016   Obesity (BMI 30-39.9) 01/08/2015   Red blood cell antibody positive 07/02/2013   Elevated C-reactive protein (CRP) 05/15/2012   Routine general medical examination at a health care facility 09/24/2011   Apnea 11/14/2010   Nevus of lower leg 11/14/2010   Other  screening mammogram 10/31/2010   Stress reaction, emotional 05/02/2010   Compulsive overeater 05/02/2010   Arthritis    H/O small bowel obstruction    KNEE PAIN 10/14/2009   DM (diabetes mellitus), type 2 with renal complications (HCC) 04/23/2009   ROSACEA 09/12/2007   Essential hypertension 05/15/2007   OSA (obstructive sleep apnea) 12/13/2006   Ulcerative colitis (HCC) 07/12/2006   Past Medical History:  Diagnosis Date   Anemia    Arthritis    generalized   Bowel obstruction (HCC)    repeated (? possible adhesions) neg EGD   Chronic kidney disease    Colitis    with colectomy   Diabetes mellitus without complication (HCC)    Dyspnea  covid   Gall stones 01/23/2006   Gastroenteritis 07/25/2014   hosp for IVF    GERD (gastroesophageal reflux disease)    History of kidney stones    HTN (hypertension)    Hyperglycemia    mild-monitors A1C   Obesity    Pneumonia    Rosacea    Sleep apnea    CPAP everynight   Past Surgical History:  Procedure Laterality Date   CHOLECYSTECTOMY     COLECTOMY  1993   has ileostomy   COLON SURGERY  2021   removed rectum   COLONOSCOPY  08/1991   COLOSTOMY REVISION  12/06/2011   Procedure: COLOSTOMY REVISION;  Surgeon: Romie Levee, MD;  Location: [facility];  Service: General;  Laterality: Right;  OSTOMY revision   CYSTOSCOPY/URETEROSCOPY/HOLMIUM LASER/STENT PLACEMENT Left 04/11/2021   Procedure: CYSTOSCOPY/URETEROSCOPY/HOLMIUM LASER/STENT PLACEMENT;  Surgeon: Vanna Scotland, MD;  Location: [facility];  Service: Urology;  Laterality: Left;   EXAMINATION UNDER ANESTHESIA  12/06/2011   Procedure: EXAM UNDER ANESTHESIA;  Surgeon: Romie Levee, MD;  Location: [facility];  Service: General;  Laterality: N/A;  rectal exam under anesthesia, anal dilation, pouchoscopy     TOTAL ABDOMINAL HYSTERECTOMY  05/2006   for abscessed ovaries   Social History   Tobacco Use   Smoking status: Never   Smokeless  tobacco: Never  Vaping Use   Vaping Use: Never used  Substance Use Topics   Alcohol use: No    Alcohol/week: 0.0 standard drinks of alcohol   Drug use: No   Family History  Problem Relation Age of Onset   Hypertension Mother    Hyperlipidemia Mother    Cirrhosis Mother        NASH   Diabetes Mother    Liver cancer Father        resection secondary to mets   Cancer Father        colon   Hyperlipidemia Father    Hypertension Father    Allergies Father    Ulcerative colitis Father    Allergies Brother    Gastric cancer Brother    Breast cancer Paternal Grandmother    Breast cancer Cousin 35       paternal cousins   Allergies  Allergen Reactions   Ciprofloxacin Rash    Erythema and pruritis around IV site, erythema and rash along vein   Other Rash   Jardiance [Empagliflozin]     Increase in Cr    Lisinopril     REACTION: felt bad/ stomach hurt/ weak and tired   Metformin And Related     GI   Sulfa Antibiotics Rash   Current Outpatient Medications on File Prior to Visit  Medication Sig Dispense Refill   ACCU-CHEK GUIDE test strip TO CHECK GLUCOSE DAILY AND AS NEEDED FOR TYPE 2 DIABETES 100 strip 1   acetaminophen (TYLENOL) 325 MG tablet Take 650 mg by mouth every 6 (six) hours as needed for moderate pain.     atorvastatin (LIPITOR) 10 MG tablet TAKE 1/2 PILL BY MOUTH EVERY OTHER DAY (Patient taking differently: Take 5 mg by mouth every other day.) 24 tablet 1   Blood Glucose Monitoring Suppl (ACCU-CHEK GUIDE) w/Device KIT To check glucose daily and as needed for type 2 diabetes 1 kit 0   Cholecalciferol (VITAMIN D3) 25 MCG (1000 UT) CAPS Take 1,000 Units by mouth daily.     esomeprazole (NEXIUM) 20 MG capsule Take 20 mg by mouth every other day.  fluticasone (FLONASE) 50 MCG/ACT nasal spray Place 2 sprays into both nostrils at bedtime.     insulin aspart protamine - aspart (NOVOLOG 70/30 MIX) (70-30) 100 UNIT/ML FlexPen Inject 13 Units into the skin 2 (two) times  daily with a meal. (Patient taking differently: Inject 18 Units into the skin 2 (two) times daily with a meal.) 15 mL 1   Lancets (ACCU-CHEK MULTICLIX) lancets To check glucose daily and as needed for diabetes type 2 100 each 3   losartan (COZAAR) 25 MG tablet Take 25 mg by mouth daily.     magnesium oxide (MAG-OX) 400 (240 Mg) MG tablet Take 2 tablets (800 mg total) by mouth 2 (two) times daily. 120 tablet 0   metoprolol tartrate (LOPRESSOR) 25 MG tablet TAKE 1 TABLET BY MOUTH EVERY DAY 90 tablet 0   Multiple Vitamin (MULTIVITAMIN) tablet Take 1 tablet by mouth daily.     NON FORMULARY SLEEPS WITH BI-PAP     simethicone (MYLICON) 80 MG chewable tablet Chew 1 tablet (80 mg total) by mouth 4 (four) times daily as needed for flatulence. 30 tablet 0   tirzepatide (MOUNJARO) 5 MG/0.5ML Pen Inject 5 mg into the skin once a week.     No current facility-administered medications on file prior to visit.    Review of Systems  Constitutional:  Positive for fatigue. Negative for activity change, appetite change, fever and unexpected weight change.  HENT:  Negative for congestion, ear pain, rhinorrhea, sinus pressure and sore throat.   Eyes:  Negative for pain, redness and visual disturbance.  Respiratory:  Negative for cough, shortness of breath and wheezing.   Cardiovascular:  Negative for chest pain and palpitations.  Gastrointestinal:  Negative for abdominal pain, blood in stool, constipation and diarrhea.  Endocrine: Negative for polydipsia and polyuria.  Genitourinary:  Negative for dysuria, frequency and urgency.  Musculoskeletal:  Positive for back pain. Negative for arthralgias and myalgias.  Skin:  Negative for pallor and rash.  Allergic/Immunologic: Negative for environmental allergies.  Neurological:  Negative for dizziness, syncope and headaches.  Hematological:  Negative for adenopathy. Does not bruise/bleed easily.  Psychiatric/Behavioral:  Negative for decreased concentration and  dysphoric mood. The patient is not nervous/anxious.        Objective:   Physical Exam Constitutional:      General: She is not in acute distress.    Appearance: Normal appearance. She is well-developed. She is obese. She is not ill-appearing or diaphoretic.  HENT:     Head: Normocephalic and atraumatic.     Mouth/Throat:     Mouth: Mucous membranes are moist.  Eyes:     General: No scleral icterus.    Conjunctiva/sclera: Conjunctivae normal.     Pupils: Pupils are equal, round, and reactive to light.  Neck:     Thyroid: No thyromegaly.     Vascular: No carotid bruit or JVD.  Cardiovascular:     Rate and Rhythm: Normal rate and regular rhythm.     Heart sounds: Normal heart sounds.     No gallop.  Pulmonary:     Effort: Pulmonary effort is normal. No respiratory distress.     Breath sounds: Normal breath sounds. No wheezing or rales.  Abdominal:     General: There is no distension or abdominal bruit.     Palpations: Abdomen is soft.     Tenderness: There is no abdominal tenderness.     Comments: Bladder does not feel distended    Colostomy site appears nl  Musculoskeletal:     Cervical back: Normal range of motion and neck supple.     Right lower leg: No edema.     Left lower leg: No edema.     Comments: Scars on back from recent spinal surgery Healed well  Lymphadenopathy:     Cervical: No cervical adenopathy.  Skin:    General: Skin is warm and dry.     Coloration: Skin is not pale.     Findings: Bruising present. No rash.     Comments: Some bruises on arms from blood draws  Neurological:     Mental Status: She is alert.     Coordination: Coordination normal.     Deep Tendon Reflexes: Reflexes are normal and symmetric. Reflexes normal.  Psychiatric:        Mood and Affect: Mood normal.           Assessment & Plan:   Problem List Items Addressed This Visit       Cardiovascular and Mediastinum   Essential hypertension    bp in fair control at this  time (after hospitalization) BP Readings from Last 1 Encounters:  05/30/22 126/60  No changes needed Most recent labs reviewed  Disc lifstyle change with low sodium diet and exercise  Plan to continue  Losartan 25 mg daily  Metoprolol 25 mg daily  Nephrology f/u in feb      Relevant Orders   Basic metabolic panel   CBC with Differential/Platelet     Endocrine   DM (diabetes mellitus), type 2 with renal complications (HCC)    Glucose was high in the hospital- treated with 70/30 insulin  Now at home still often in 200s to 300s  Diet is getting back to normal   Advised to start back on mounjaro 5 mg  Hold insulin- can re start if glucose is high Still holding glipizide Pharmacy referral done also   Lab today      Relevant Medications   tirzepatide (MOUNJARO) 5 MG/0.5ML Pen     Musculoskeletal and Integument   Closed fracture of first lumbar vertebra (HCC) - Primary    S//p neuro surgery for burst fx  Reviewed hospital records, lab results and studies in detail  Rev neuro surg note recent Not wearing brace today  Percocet prn-not using much  PT/ OT starting now  Overall doing well  Of note-normal bmd on last dexa  This was not a fragility fx Discussed fall prev      Closed tricolumnar fracture of lumbar vertebra (HCC)    Better post surg Had neurosurg f/u        Genitourinary   CKD (chronic kidney disease) stage 3, GFR 30-59 ml/min (HCC)    This got worse (AKI) in hospital with uti and urinary retention Had foley removed today /saw urology  Feels better Lab pending      Relevant Orders   Basic metabolic panel     Other   Leukocytosis    Re check cbc today  Was during stress and infection      Relevant Orders   CBC with Differential/Platelet   Morbid obesity (HCC)    Pt is getting back to pre hospital diet  PT for exercise while getting strength back   Will start back on mounjaro      Relevant Medications   tirzepatide (MOUNJARO) 5 MG/0.5ML  Pen

## 2022-05-30 NOTE — Assessment & Plan Note (Signed)
Glucose was high in the hospital- treated with 70/30 insulin  Now at home still often in 200s to 300s  Diet is getting back to normal   Advised to start back on mounjaro 5 mg  Hold insulin- can re start if glucose is high Still holding glipizide Pharmacy referral done also   Lab today

## 2022-05-30 NOTE — Assessment & Plan Note (Signed)
S//p neuro surgery for burst fx  Reviewed hospital records, lab results and studies in detail  Rev neuro surg note recent Not wearing brace today  Percocet prn-not using much  PT/ OT starting now  Overall doing well  Of note-normal bmd on last dexa  This was not a fragility fx Discussed fall prev

## 2022-05-30 NOTE — Assessment & Plan Note (Signed)
Better post surg Had neurosurg f/u

## 2022-05-30 NOTE — Progress Notes (Signed)
05/30/2022 4:32 PM   DAKOTAH WEEDA 01-Nov-1955 [id number]  CC: Chief Complaint  Patient presents with   Other   HPI: CARSYNN LEAPHART is a 67 y.o. female with PMH DM, CKD, OSA on CPAP, nephrolithiasis, and UC s/p colectomy with ileostomy who recently sustained an L1 compression fracture and a fall, now s/p posterior thoracic fusion T11-L3 with Dr. Marcell Barlow on 05/12/2022.  She developed postoperative urinary retention with AKI and gross hematuria following Foley catheter placement.  She presents today for outpatient voiding trial.  Foley catheter removed in the morning, see separate procedure note for details.  She returned to clinic in the afternoon.  She reports she has been unable to void but feels the urge to do so and is starting to become uncomfortable.  Bladder scan with 284 mL.  She is rather tearful about her inability to void.  She states her gross hematuria resolved after I saw her in the hospital, but she is unsure when this occurred.  She did ultimately get treated for possible UTI with Rocephin and discharged on cefadroxil with urine cultures growing Macrobid resistant Proteus mirabilis.  She denies saddle anesthesia today.  No change in bowel function per ostomy.  She is having some numbness in her left lateral thigh postoperatively.  Additionally, she reports that she has been prescribed antifungal powder for a yeast infection in her groin, however she has had difficulty with applying it due to her recent spinal surgery.  She admits to some vulvar itching.  PMH: Past Medical History:  Diagnosis Date   Anemia    Arthritis    generalized   Bowel obstruction (HCC)    repeated (? possible adhesions) neg EGD   Chronic kidney disease    Colitis    with colectomy   Diabetes mellitus without complication (HCC)    Dyspnea    covid   Gall stones 01/23/2006   Gastroenteritis 07/25/2014   hosp for IVF    GERD (gastroesophageal reflux disease)    History of kidney stones     HTN (hypertension)    Hyperglycemia    mild-monitors A1C   Obesity    Pneumonia    Rosacea    Sleep apnea    CPAP everynight    Surgical History: Past Surgical History:  Procedure Laterality Date   CHOLECYSTECTOMY     COLECTOMY  1993   has ileostomy   COLON SURGERY  2021   removed rectum   COLONOSCOPY  08/1991   COLOSTOMY REVISION  12/06/2011   Procedure: COLOSTOMY REVISION;  Surgeon: Romie Levee, MD;  Location: [facility];  Service: General;  Laterality: Right;  OSTOMY revision   CYSTOSCOPY/URETEROSCOPY/HOLMIUM LASER/STENT PLACEMENT Left 04/11/2021   Procedure: CYSTOSCOPY/URETEROSCOPY/HOLMIUM LASER/STENT PLACEMENT;  Surgeon: Vanna Scotland, MD;  Location: [facility];  Service: Urology;  Laterality: Left;   EXAMINATION UNDER ANESTHESIA  12/06/2011   Procedure: EXAM UNDER ANESTHESIA;  Surgeon: Romie Levee, MD;  Location: [facility];  Service: General;  Laterality: N/A;  rectal exam under anesthesia, anal dilation, pouchoscopy     TOTAL ABDOMINAL HYSTERECTOMY  05/2006   for abscessed ovaries    Home Medications:  Allergies as of 05/30/2022       Reactions   Ciprofloxacin Rash   Erythema and pruritis around IV site, erythema and rash along vein   Other Rash   Jardiance [empagliflozin]    Increase in Cr    Lisinopril    REACTION: felt bad/ stomach hurt/ weak and tired  Metformin And Related    GI   Sulfa Antibiotics Rash        Medication List        Accurate as of May 30, 2022  4:32 PM. If you have any questions, ask your nurse or doctor.          STOP taking these medications    cefadroxil 500 MG capsule Commonly known as: DURICEF Stopped by: Roxy Manns, MD       TAKE these medications    Accu-Chek Guide test strip Generic drug: glucose blood TO CHECK GLUCOSE DAILY AND AS NEEDED FOR TYPE 2 DIABETES   Accu-Chek Guide w/Device Kit To check glucose daily and as needed for type 2 diabetes   accu-chek multiclix  lancets To check glucose daily and as needed for diabetes type 2   acetaminophen 325 MG tablet Commonly known as: TYLENOL Take 650 mg by mouth every 6 (six) hours as needed for moderate pain.   atorvastatin 10 MG tablet Commonly known as: LIPITOR TAKE 1/2 PILL BY MOUTH EVERY OTHER DAY What changed: See the new instructions.   esomeprazole 20 MG capsule Commonly known as: NEXIUM Take 20 mg by mouth every other day.   fluticasone 50 MCG/ACT nasal spray Commonly known as: FLONASE Place 2 sprays into both nostrils at bedtime.   insulin aspart protamine - aspart (70-30) 100 UNIT/ML FlexPen Commonly known as: NOVOLOG 70/30 MIX Inject 13 Units into the skin 2 (two) times daily with a meal. What changed: how much to take   losartan 25 MG tablet Commonly known as: COZAAR Take 25 mg by mouth daily.   magnesium oxide 400 (240 Mg) MG tablet Commonly known as: MAG-OX Take 2 tablets (800 mg total) by mouth 2 (two) times daily.   metoprolol tartrate 25 MG tablet Commonly known as: LOPRESSOR TAKE 1 TABLET BY MOUTH EVERY DAY   Mounjaro 5 MG/0.5ML Pen Generic drug: tirzepatide Inject 5 mg into the skin once a week.   multivitamin tablet Take 1 tablet by mouth daily.   NON FORMULARY SLEEPS WITH BI-PAP   simethicone 80 MG chewable tablet Commonly known as: MYLICON Chew 1 tablet (80 mg total) by mouth 4 (four) times daily as needed for flatulence.   Vitamin D3 25 MCG (1000 UT) Caps Take 1,000 Units by mouth daily.   zolpidem 10 MG tablet Commonly known as: AMBIEN Take 0.5 tablets (5 mg total) by mouth at bedtime as needed for sleep. Caution of sedation and falls Started by: Roxy Manns, MD        Allergies:  Allergies  Allergen Reactions   Ciprofloxacin Rash    Erythema and pruritis around IV site, erythema and rash along vein   Other Rash   Jardiance [Empagliflozin]     Increase in Cr    Lisinopril     REACTION: felt bad/ stomach hurt/ weak and tired   Metformin  And Related     GI   Sulfa Antibiotics Rash    Family History: Family History  Problem Relation Age of Onset   Hypertension Mother    Hyperlipidemia Mother    Cirrhosis Mother        NASH   Diabetes Mother    Liver cancer Father        resection secondary to mets   Cancer Father        colon   Hyperlipidemia Father    Hypertension Father    Allergies Father    Ulcerative colitis Father  Allergies Brother    Gastric cancer Brother    Breast cancer Paternal Grandmother    Breast cancer Cousin 9       paternal cousins    Social History:   reports that she has never smoked. She has never used smokeless tobacco. She reports that she does not drink alcohol and does not use drugs.  Physical Exam: There were no vitals taken for this visit.  Constitutional:  Alert and oriented, no acute distress, nontoxic appearing HEENT: [place], AT Cardiovascular: No clubbing, cyanosis, or edema Respiratory: Normal respiratory effort, no increased work of breathing GU: Beefy red rash of the bilateral inguinal creases, labia majora, and milky, white discharge within the vaginal vault. Skin: No rashes, bruises or suspicious lesions Neurologic: Grossly intact, no focal deficits, moving all 4 extremities Psychiatric: Normal mood and affect  Laboratory Data: Results for orders placed or performed in visit on 05/30/22  Bladder Scan (Post Void Residual) in office  Result Value Ref Range   Scan Result 284    Simple Catheter Placement  Due to urinary retention patient is present today for a foley cath placement.  Patient was cleaned and prepped in a sterile fashion with betadine. A 16 FR coude foley catheter was inserted, urine return was noted , urine was yellow in color.  The balloon was filled with 10cc of sterile water.  A leg bag was attached for drainage. Patient was also given a night bag to take home.  Patient tolerated well, no complications were noted   Performed by: Carman Ching, PA-C and Wynona Dove, RN  Assessment & Plan:   1. Urinary retention Voiding trial failed today.  She has been completely unable to urinate, though feels the urge to do so.  I offered her Foley catheter replacement versus CIC teaching and she elected for the former, with CIC being challenging in light of her recent spinal surgery.  I will reach out to Dr. Myer Haff regarding her ongoing inability to void in case he wishes to pursue additional testing. - Bladder Scan (Post Void Residual) in office  2. Vulvovaginal candidiasis Noted on physical exam with Foley catheter placement today.  Contributors include recent antibiotics and diabetes.  Will treat with Diflucan 150 mg x 1 dose, especially since she is having difficulty applying powder. - fluconazole (DIFLUCAN) 150 MG tablet; Take 1 tablet (150 mg total) by mouth once for 1 dose.  Dispense: 1 tablet; Refill: 0  3. Gross hematuria Resolved.  Will keep plans for outpatient cystoscopy with Dr. Apolinar Junes.  Return for Keep plans for cystoscopy.  Carman Ching, PA-C  [facility] [place] [address] [place], [place] [address] [phone]

## 2022-05-30 NOTE — Assessment & Plan Note (Signed)
This got worse (AKI) in hospital with uti and urinary retention Had foley removed today /saw urology  Feels better Lab pending

## 2022-05-31 NOTE — Progress Notes (Signed)
Labs ordered.

## 2022-05-31 NOTE — Addendum Note (Signed)
Addended by: Roxy Manns A on: 05/31/2022 08:31 PM   Modules accepted: Orders

## 2022-06-01 ENCOUNTER — Ambulatory Visit: Payer: Medicare PPO | Admitting: Physician Assistant

## 2022-06-01 DIAGNOSIS — N136 Pyonephrosis: Secondary | ICD-10-CM | POA: Diagnosis not present

## 2022-06-01 DIAGNOSIS — I1310 Hypertensive heart and chronic kidney disease without heart failure, with stage 1 through stage 4 chronic kidney disease, or unspecified chronic kidney disease: Secondary | ICD-10-CM | POA: Diagnosis not present

## 2022-06-01 DIAGNOSIS — I2510 Atherosclerotic heart disease of native coronary artery without angina pectoris: Secondary | ICD-10-CM | POA: Diagnosis not present

## 2022-06-01 DIAGNOSIS — S32010D Wedge compression fracture of first lumbar vertebra, subsequent encounter for fracture with routine healing: Secondary | ICD-10-CM | POA: Diagnosis not present

## 2022-06-01 DIAGNOSIS — E1122 Type 2 diabetes mellitus with diabetic chronic kidney disease: Secondary | ICD-10-CM | POA: Diagnosis not present

## 2022-06-01 DIAGNOSIS — B964 Proteus (mirabilis) (morganii) as the cause of diseases classified elsewhere: Secondary | ICD-10-CM | POA: Diagnosis not present

## 2022-06-01 DIAGNOSIS — N1831 Chronic kidney disease, stage 3a: Secondary | ICD-10-CM | POA: Diagnosis not present

## 2022-06-01 DIAGNOSIS — N179 Acute kidney failure, unspecified: Secondary | ICD-10-CM | POA: Diagnosis not present

## 2022-06-01 DIAGNOSIS — D631 Anemia in chronic kidney disease: Secondary | ICD-10-CM | POA: Diagnosis not present

## 2022-06-02 DIAGNOSIS — N1831 Chronic kidney disease, stage 3a: Secondary | ICD-10-CM | POA: Diagnosis not present

## 2022-06-02 DIAGNOSIS — N136 Pyonephrosis: Secondary | ICD-10-CM | POA: Diagnosis not present

## 2022-06-02 DIAGNOSIS — I1310 Hypertensive heart and chronic kidney disease without heart failure, with stage 1 through stage 4 chronic kidney disease, or unspecified chronic kidney disease: Secondary | ICD-10-CM | POA: Diagnosis not present

## 2022-06-02 DIAGNOSIS — B964 Proteus (mirabilis) (morganii) as the cause of diseases classified elsewhere: Secondary | ICD-10-CM | POA: Diagnosis not present

## 2022-06-02 DIAGNOSIS — S32010D Wedge compression fracture of first lumbar vertebra, subsequent encounter for fracture with routine healing: Secondary | ICD-10-CM | POA: Diagnosis not present

## 2022-06-02 DIAGNOSIS — I2510 Atherosclerotic heart disease of native coronary artery without angina pectoris: Secondary | ICD-10-CM | POA: Diagnosis not present

## 2022-06-02 DIAGNOSIS — E1122 Type 2 diabetes mellitus with diabetic chronic kidney disease: Secondary | ICD-10-CM | POA: Diagnosis not present

## 2022-06-02 DIAGNOSIS — D631 Anemia in chronic kidney disease: Secondary | ICD-10-CM | POA: Diagnosis not present

## 2022-06-02 DIAGNOSIS — N179 Acute kidney failure, unspecified: Secondary | ICD-10-CM | POA: Diagnosis not present

## 2022-06-05 ENCOUNTER — Encounter: Payer: Self-pay | Admitting: Family Medicine

## 2022-06-05 ENCOUNTER — Telehealth: Payer: Self-pay

## 2022-06-05 NOTE — Progress Notes (Signed)
Care Management & Coordination Services Pharmacy Team  Reason for Encounter: Appointment Reminder  Contacted patient to confirm in office appointment with Al Corpus , PharmD on 06/06/22 at 11:00.  Reminded patient of location  [facility] Spoke with patient on 06/05/2022   Do you  have any problems getting your medications? No  Have you seen any other providers since your last visit with PCP? No   Chart review:  Recent office visits:  5/7/24Idamae Schuller Tower,MD(PCP)-hospital follow up,started dexcom also,advised to restart mounjaro 5mg ,hold glipizide,labs ordered,f/u 2 weeks  04/07/22-Marne Tower,MD(PCP)-hearing loss, for sinus-continue flonase BID, add mucinex D.referral for ENT if worsening  02/06/22-Marne Tower,MD(PCP)-f/u chronic, A1c 6.4,discussed exercise,f/u 3-6 months  Recent consult visits:  05/30/22-Samantha Vaillamcourt,PA(uro)-voiding trial failed today.start diflucan 150mg   1 dose,f/u with cystoscopy- foley removed 05/29/22-Stacy Luna,PA(neuro)-f/u -post  T11-L3 fractures-minimal pain.avoid bending, twisting, or lifting. You can lift up to 10 pounds  05/10/22-Ashley Brandon,MD(uro)- f/u kidney stone,xray,stop emergen-C.f/u 1 year 04/06/22-Munsoor,Lateef,MD(neph)-f/u DM,maintain adequate hydration,f/u 4 months 03/07/22-Kurian Kasa,MD(pulmo)-f/u OSA,f/u 1 year Hospital visits:  Medication Reconciliation was completed by comparing discharge summary, patient's EMR and Pharmacy list, and upon discussion with patient.  Admitted to the hospital on 05/10/22 due to Compression Fracture L1. Discharge date was 05/24/22. Discharged from [facility].    New?Medications Started at [facility] Discharge:?? -started oxycodone   Medications Discontinued at Hospital Discharge: -Stopped hyoscyamine , zolpidem  Medications that remain the same after Hospital Discharge:?? home with home health, back brace,antibiotics, -All other medications will remain the same.     Star Rating Drugs:   Medication:  Last Fill: [facility] Atorvastatin 10mg  05/20/22 90 Novolog  70/30 05/24/22  57 Losartan 25mg  05/25/22  90   Care Gaps: Annual wellness visit in last year? Yes  If Diabetic: Last eye exam / retinopathy screening:UTD Last diabetic foot exam:UTD UACR - 4  Al Corpus, PharmD notified  Burt Knack, [facility] Clinical Pharmacy Assistant [phone]

## 2022-06-06 ENCOUNTER — Ambulatory Visit: Payer: Medicare PPO | Admitting: Pharmacist

## 2022-06-06 ENCOUNTER — Telehealth: Payer: Self-pay | Admitting: Family Medicine

## 2022-06-06 DIAGNOSIS — S32010D Wedge compression fracture of first lumbar vertebra, subsequent encounter for fracture with routine healing: Secondary | ICD-10-CM | POA: Diagnosis not present

## 2022-06-06 DIAGNOSIS — B964 Proteus (mirabilis) (morganii) as the cause of diseases classified elsewhere: Secondary | ICD-10-CM | POA: Diagnosis not present

## 2022-06-06 DIAGNOSIS — R790 Abnormal level of blood mineral: Secondary | ICD-10-CM | POA: Insufficient documentation

## 2022-06-06 DIAGNOSIS — N179 Acute kidney failure, unspecified: Secondary | ICD-10-CM | POA: Diagnosis not present

## 2022-06-06 DIAGNOSIS — N1831 Chronic kidney disease, stage 3a: Secondary | ICD-10-CM | POA: Diagnosis not present

## 2022-06-06 DIAGNOSIS — I2510 Atherosclerotic heart disease of native coronary artery without angina pectoris: Secondary | ICD-10-CM | POA: Diagnosis not present

## 2022-06-06 DIAGNOSIS — N136 Pyonephrosis: Secondary | ICD-10-CM | POA: Diagnosis not present

## 2022-06-06 DIAGNOSIS — N1832 Chronic kidney disease, stage 3b: Secondary | ICD-10-CM

## 2022-06-06 DIAGNOSIS — D72829 Elevated white blood cell count, unspecified: Secondary | ICD-10-CM

## 2022-06-06 DIAGNOSIS — I1310 Hypertensive heart and chronic kidney disease without heart failure, with stage 1 through stage 4 chronic kidney disease, or unspecified chronic kidney disease: Secondary | ICD-10-CM | POA: Diagnosis not present

## 2022-06-06 DIAGNOSIS — E1122 Type 2 diabetes mellitus with diabetic chronic kidney disease: Secondary | ICD-10-CM | POA: Diagnosis not present

## 2022-06-06 DIAGNOSIS — D631 Anemia in chronic kidney disease: Secondary | ICD-10-CM | POA: Diagnosis not present

## 2022-06-06 NOTE — Telephone Encounter (Signed)
-----   Message from Alvina Chou sent at 05/31/2022  3:53 PM EDT ----- Regarding: Lab orders for Thursday, 5.16.24 Lab orders, thanks

## 2022-06-06 NOTE — Telephone Encounter (Signed)
-----   Message from Kathyrn Sheriff, [facility] sent at 06/06/2022  2:38 PM EDT ----- Regarding: Mg level? Hello!  Jamie [name] started magnesium when she was in the hospital last month and has continued 800 mg BID. Previously pt had normal Mg levels, it is possible insulin resistance/insulin use in hospital reduced Mg levels. She is back on Mounjaro (which improves insulin sensitivity) and is no longer needing insulin so Mg levels may recover without need for supplementation.  I wonder if we can re-check her Mg levels when she comes in for labs later this week?  Mardella Layman
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
## 2022-06-06 NOTE — Telephone Encounter (Signed)
Thanks for the heads up Ask pt to hold the magnesium  I doubt she needs it   I placed an order to add to the upcoming labs  Thanks

## 2022-06-07 DIAGNOSIS — I1310 Hypertensive heart and chronic kidney disease without heart failure, with stage 1 through stage 4 chronic kidney disease, or unspecified chronic kidney disease: Secondary | ICD-10-CM | POA: Diagnosis not present

## 2022-06-07 DIAGNOSIS — B964 Proteus (mirabilis) (morganii) as the cause of diseases classified elsewhere: Secondary | ICD-10-CM | POA: Diagnosis not present

## 2022-06-07 DIAGNOSIS — D631 Anemia in chronic kidney disease: Secondary | ICD-10-CM | POA: Diagnosis not present

## 2022-06-07 DIAGNOSIS — N1831 Chronic kidney disease, stage 3a: Secondary | ICD-10-CM | POA: Diagnosis not present

## 2022-06-07 DIAGNOSIS — N179 Acute kidney failure, unspecified: Secondary | ICD-10-CM | POA: Diagnosis not present

## 2022-06-07 DIAGNOSIS — E1122 Type 2 diabetes mellitus with diabetic chronic kidney disease: Secondary | ICD-10-CM | POA: Diagnosis not present

## 2022-06-07 DIAGNOSIS — I2510 Atherosclerotic heart disease of native coronary artery without angina pectoris: Secondary | ICD-10-CM | POA: Diagnosis not present

## 2022-06-07 DIAGNOSIS — N136 Pyonephrosis: Secondary | ICD-10-CM | POA: Diagnosis not present

## 2022-06-07 DIAGNOSIS — S32010D Wedge compression fracture of first lumbar vertebra, subsequent encounter for fracture with routine healing: Secondary | ICD-10-CM | POA: Diagnosis not present

## 2022-06-07 NOTE — Telephone Encounter (Signed)
Pt notified of Dr. Royden Purl comments. She will hold Magnesium tabs until Dr. Milinda Antis advises on labs

## 2022-06-09 ENCOUNTER — Other Ambulatory Visit (INDEPENDENT_AMBULATORY_CARE_PROVIDER_SITE_OTHER): Payer: Medicare PPO

## 2022-06-09 DIAGNOSIS — D72829 Elevated white blood cell count, unspecified: Secondary | ICD-10-CM | POA: Diagnosis not present

## 2022-06-09 DIAGNOSIS — N179 Acute kidney failure, unspecified: Secondary | ICD-10-CM | POA: Diagnosis not present

## 2022-06-09 DIAGNOSIS — B964 Proteus (mirabilis) (morganii) as the cause of diseases classified elsewhere: Secondary | ICD-10-CM | POA: Diagnosis not present

## 2022-06-09 DIAGNOSIS — R790 Abnormal level of blood mineral: Secondary | ICD-10-CM | POA: Diagnosis not present

## 2022-06-09 DIAGNOSIS — N136 Pyonephrosis: Secondary | ICD-10-CM | POA: Diagnosis not present

## 2022-06-09 DIAGNOSIS — I1310 Hypertensive heart and chronic kidney disease without heart failure, with stage 1 through stage 4 chronic kidney disease, or unspecified chronic kidney disease: Secondary | ICD-10-CM | POA: Diagnosis not present

## 2022-06-09 DIAGNOSIS — N1832 Chronic kidney disease, stage 3b: Secondary | ICD-10-CM | POA: Diagnosis not present

## 2022-06-09 DIAGNOSIS — E1122 Type 2 diabetes mellitus with diabetic chronic kidney disease: Secondary | ICD-10-CM | POA: Diagnosis not present

## 2022-06-09 DIAGNOSIS — D631 Anemia in chronic kidney disease: Secondary | ICD-10-CM | POA: Diagnosis not present

## 2022-06-09 DIAGNOSIS — S32010D Wedge compression fracture of first lumbar vertebra, subsequent encounter for fracture with routine healing: Secondary | ICD-10-CM | POA: Diagnosis not present

## 2022-06-09 DIAGNOSIS — N1831 Chronic kidney disease, stage 3a: Secondary | ICD-10-CM | POA: Diagnosis not present

## 2022-06-09 DIAGNOSIS — I2510 Atherosclerotic heart disease of native coronary artery without angina pectoris: Secondary | ICD-10-CM | POA: Diagnosis not present

## 2022-06-09 LAB — CBC WITH DIFFERENTIAL/PLATELET
Hemoglobin: 11.3 g/dL — ABNORMAL LOW (ref 11.7–15.5)
MPV: 10.9 fL (ref 7.5–12.5)
Platelets: 294 10*3/uL (ref 140–400)
RBC: 3.63 10*6/uL — ABNORMAL LOW (ref 3.80–5.10)

## 2022-06-09 NOTE — Addendum Note (Signed)
Addended by: Makenleigh Crownover D on: 06/09/2022 02:31 PM   Modules accepted: Orders  

## 2022-06-09 NOTE — Addendum Note (Signed)
Addended by: Finis Hendricksen D on: 06/09/2022 02:31 PM   Modules accepted: Orders  

## 2022-06-09 NOTE — Addendum Note (Signed)
Addended by: Trellis Paganini D on: 06/09/2022 02:31 PM   Modules accepted: Orders

## 2022-06-09 NOTE — Addendum Note (Signed)
Addended by: Kayann Maj D on: 06/09/2022 02:31 PM   Modules accepted: Orders  

## 2022-06-10 LAB — CBC WITH DIFFERENTIAL/PLATELET
Absolute Monocytes: 602 cells/uL (ref 200–950)
Basophils Absolute: 42 cells/uL (ref 0–200)
Basophils Relative: 0.6 %
Eosinophils Absolute: 427 cells/uL (ref 15–500)
Eosinophils Relative: 6.1 %
HCT: 34.5 % — ABNORMAL LOW (ref 35.0–45.0)
Lymphs Abs: 2142 cells/uL (ref 850–3900)
MCH: 31.1 pg (ref 27.0–33.0)
MCHC: 32.8 g/dL (ref 32.0–36.0)
MCV: 95 fL (ref 80.0–100.0)
Monocytes Relative: 8.6 %
Neutro Abs: 3787 cells/uL (ref 1500–7800)
Neutrophils Relative %: 54.1 %
RDW: 12.8 % (ref 11.0–15.0)
Total Lymphocyte: 30.6 %
WBC: 7 10*3/uL (ref 3.8–10.8)

## 2022-06-10 LAB — BASIC METABOLIC PANEL
BUN/Creatinine Ratio: 8 (calc) (ref 6–22)
BUN: 10 mg/dL (ref 7–25)
CO2: 23 mmol/L (ref 20–32)
Calcium: 9.2 mg/dL (ref 8.6–10.4)
Chloride: 107 mmol/L (ref 98–110)
Creat: 1.3 mg/dL — ABNORMAL HIGH (ref 0.50–1.05)
Glucose, Bld: 161 mg/dL — ABNORMAL HIGH (ref 65–99)
Potassium: 4.2 mmol/L (ref 3.5–5.3)
Sodium: 140 mmol/L (ref 135–146)

## 2022-06-10 LAB — MAGNESIUM: Magnesium: 1.6 mg/dL (ref 1.5–2.5)

## 2022-06-11 ENCOUNTER — Encounter: Payer: Self-pay | Admitting: Family Medicine

## 2022-06-12 MED ORDER — FREESTYLE LIBRE 3 SENSOR MISC: 3 refills | Status: DC

## 2022-06-12 NOTE — Telephone Encounter (Signed)
I did que up the px Please send to preferred pharmacy  Thanks

## 2022-06-14 DIAGNOSIS — H903 Sensorineural hearing loss, bilateral: Secondary | ICD-10-CM | POA: Diagnosis not present

## 2022-06-14 DIAGNOSIS — S32010D Wedge compression fracture of first lumbar vertebra, subsequent encounter for fracture with routine healing: Secondary | ICD-10-CM | POA: Diagnosis not present

## 2022-06-14 DIAGNOSIS — D631 Anemia in chronic kidney disease: Secondary | ICD-10-CM | POA: Diagnosis not present

## 2022-06-14 DIAGNOSIS — B964 Proteus (mirabilis) (morganii) as the cause of diseases classified elsewhere: Secondary | ICD-10-CM | POA: Diagnosis not present

## 2022-06-14 DIAGNOSIS — N1831 Chronic kidney disease, stage 3a: Secondary | ICD-10-CM | POA: Diagnosis not present

## 2022-06-14 DIAGNOSIS — E1122 Type 2 diabetes mellitus with diabetic chronic kidney disease: Secondary | ICD-10-CM | POA: Diagnosis not present

## 2022-06-14 DIAGNOSIS — N136 Pyonephrosis: Secondary | ICD-10-CM | POA: Diagnosis not present

## 2022-06-14 DIAGNOSIS — N179 Acute kidney failure, unspecified: Secondary | ICD-10-CM | POA: Diagnosis not present

## 2022-06-14 DIAGNOSIS — H6983 Other specified disorders of Eustachian tube, bilateral: Secondary | ICD-10-CM | POA: Diagnosis not present

## 2022-06-14 DIAGNOSIS — I2510 Atherosclerotic heart disease of native coronary artery without angina pectoris: Secondary | ICD-10-CM | POA: Diagnosis not present

## 2022-06-14 DIAGNOSIS — I1310 Hypertensive heart and chronic kidney disease without heart failure, with stage 1 through stage 4 chronic kidney disease, or unspecified chronic kidney disease: Secondary | ICD-10-CM | POA: Diagnosis not present

## 2022-06-20 DIAGNOSIS — N1831 Chronic kidney disease, stage 3a: Secondary | ICD-10-CM | POA: Diagnosis not present

## 2022-06-20 DIAGNOSIS — N179 Acute kidney failure, unspecified: Secondary | ICD-10-CM | POA: Diagnosis not present

## 2022-06-20 DIAGNOSIS — I1310 Hypertensive heart and chronic kidney disease without heart failure, with stage 1 through stage 4 chronic kidney disease, or unspecified chronic kidney disease: Secondary | ICD-10-CM | POA: Diagnosis not present

## 2022-06-20 DIAGNOSIS — Z932 Ileostomy status: Secondary | ICD-10-CM | POA: Diagnosis not present

## 2022-06-20 DIAGNOSIS — R932 Abnormal findings on diagnostic imaging of liver and biliary tract: Secondary | ICD-10-CM | POA: Diagnosis not present

## 2022-06-20 DIAGNOSIS — I2510 Atherosclerotic heart disease of native coronary artery without angina pectoris: Secondary | ICD-10-CM | POA: Diagnosis not present

## 2022-06-20 DIAGNOSIS — D631 Anemia in chronic kidney disease: Secondary | ICD-10-CM | POA: Diagnosis not present

## 2022-06-20 DIAGNOSIS — G4733 Obstructive sleep apnea (adult) (pediatric): Secondary | ICD-10-CM | POA: Diagnosis not present

## 2022-06-20 DIAGNOSIS — K5190 Ulcerative colitis, unspecified, without complications: Secondary | ICD-10-CM | POA: Diagnosis not present

## 2022-06-20 DIAGNOSIS — E119 Type 2 diabetes mellitus without complications: Secondary | ICD-10-CM | POA: Diagnosis not present

## 2022-06-20 DIAGNOSIS — S32010D Wedge compression fracture of first lumbar vertebra, subsequent encounter for fracture with routine healing: Secondary | ICD-10-CM | POA: Diagnosis not present

## 2022-06-20 DIAGNOSIS — N136 Pyonephrosis: Secondary | ICD-10-CM | POA: Diagnosis not present

## 2022-06-20 DIAGNOSIS — B964 Proteus (mirabilis) (morganii) as the cause of diseases classified elsewhere: Secondary | ICD-10-CM | POA: Diagnosis not present

## 2022-06-20 DIAGNOSIS — E1122 Type 2 diabetes mellitus with diabetic chronic kidney disease: Secondary | ICD-10-CM | POA: Diagnosis not present

## 2022-06-21 ENCOUNTER — Other Ambulatory Visit: Payer: Self-pay

## 2022-06-21 DIAGNOSIS — S22089S Unspecified fracture of T11-T12 vertebra, sequela: Secondary | ICD-10-CM

## 2022-06-21 DIAGNOSIS — S32012S Unstable burst fracture of first lumbar vertebra, sequela: Secondary | ICD-10-CM

## 2022-06-22 ENCOUNTER — Ambulatory Visit (INDEPENDENT_AMBULATORY_CARE_PROVIDER_SITE_OTHER): Payer: Medicare PPO | Admitting: Neurosurgery

## 2022-06-22 ENCOUNTER — Encounter: Payer: Self-pay | Admitting: Neurosurgery

## 2022-06-22 ENCOUNTER — Ambulatory Visit
Admission: RE | Admit: 2022-06-22 | Discharge: 2022-06-22 | Disposition: A | Discharge status: Home / Self Care | Payer: Medicare PPO | Source: Ambulatory Visit | Attending: Neurosurgery | Admitting: Neurosurgery

## 2022-06-22 VITALS — BP 116/57 | HR 78 | Temp 98.3°F | Ht 65.0 in | Wt 250.0 lb

## 2022-06-22 DIAGNOSIS — M47816 Spondylosis without myelopathy or radiculopathy, lumbar region: Secondary | ICD-10-CM | POA: Diagnosis not present

## 2022-06-22 DIAGNOSIS — S22089S Unspecified fracture of T11-T12 vertebra, sequela: Secondary | ICD-10-CM | POA: Diagnosis not present

## 2022-06-22 DIAGNOSIS — S22088D Other fracture of T11-T12 vertebra, subsequent encounter for fracture with routine healing: Secondary | ICD-10-CM

## 2022-06-22 DIAGNOSIS — M8588 Other specified disorders of bone density and structure, other site: Secondary | ICD-10-CM | POA: Diagnosis not present

## 2022-06-22 DIAGNOSIS — S32012S Unstable burst fracture of first lumbar vertebra, sequela: Secondary | ICD-10-CM

## 2022-06-22 DIAGNOSIS — S32018D Other fracture of first lumbar vertebra, subsequent encounter for fracture with routine healing: Secondary | ICD-10-CM

## 2022-06-22 DIAGNOSIS — W109XXD Fall (on) (from) unspecified stairs and steps, subsequent encounter: Secondary | ICD-10-CM

## 2022-06-22 NOTE — Progress Notes (Signed)
   REFERRING PHYSICIAN:  No referring provider defined for this encounter.  DOS: 05/12/22 T11-L3 posterior instrumentation with ORIF of T12 and L1 fractures  HISTORY OF PRESENT ILLNESS: Jamie [name] is status post T11-L3 posterior instrumentation with ORIF T12 and L1 fractures.   Jamie [name] is doing well.  Jamie [name] still has a Foley catheter.  Jamie [name] will be evaluated in urology next week.    PHYSICAL EXAMINATION:  General: Patient is well developed, well nourished, calm, collected, and in no apparent distress.   NEUROLOGICAL:  General: In no acute distress.   Awake, alert, oriented to person, place, and time.  Pupils equal round and reactive to light.  Facial tone is symmetric.     Strength:          Side Iliopsoas Quads Hamstring PF DF EHL  R 5 5 5 5 5 5   L 5 5 5 5 5 5    Incisions are c/d/i  Jamie [name] has some numbness in left thigh in distribution of LFCN.   ROS (Neurologic):  Negative except as noted above  IMAGING: No complications noted  ASSESSMENT/PLAN:  Jamie [name] is doing well s/p above surgery.  Will see her back in 6 weeks.  We reviewed her activity limitations.  Jamie [name] is released to begin lifting up to 25 pounds.  Jamie [name] is released to begin driving.  Jamie [name] can start slowly increasing activity.    Venetia Night MD Department of neurosurgery

## 2022-06-27 ENCOUNTER — Ambulatory Visit: Payer: Medicare PPO | Admitting: Urology

## 2022-06-27 VITALS — BP 141/75

## 2022-06-27 DIAGNOSIS — N201 Calculus of ureter: Secondary | ICD-10-CM

## 2022-06-27 DIAGNOSIS — R310 Gross hematuria: Secondary | ICD-10-CM | POA: Diagnosis not present

## 2022-06-27 DIAGNOSIS — R339 Retention of urine, unspecified: Secondary | ICD-10-CM

## 2022-06-27 LAB — BLADDER SCAN AMB NON-IMAGING: Scan Result: 275

## 2022-06-27 MED ORDER — CEPHALEXIN 250 MG PO CAPS
500.0000 mg | ORAL_CAPSULE | Freq: Once | ORAL | Status: AC
Start: 2022-06-27 — End: 2022-06-27
  Administered 2022-06-27: 500 mg via ORAL

## 2022-06-27 NOTE — Addendum Note (Signed)
Addended byRanda Lynn on: 06/27/2022 02:54 PM   Modules accepted: Orders

## 2022-06-27 NOTE — Progress Notes (Signed)
   06/27/22  CC:  Chief Complaint  Patient presents with   Cysto    HPI: 67 year old female who presents today for cystoscopy in the setting of urinary retention and gross hematuria.  She recently sustained an L1 compression fracture requiring posterior thoracic fusion T11-L3 with Dr. Marcell Barlow.  She developed postop acute kidney injury and urinary retention.  Her creatinine is now back to baseline.  She did have gross hematuria after Foley catheter placement.  CT abdomen pelvis without contrast was unremarkable.  She is failed voiding trial x 1.  She is very anxious to get the catheter removed.  Blood pressure (!) 141/75. NED. A&Ox3.   No respiratory distress   Abd soft, NT, ND Normal external genitalia with patent urethral meatus  Cystoscopy Procedure Note  Patient identification was confirmed, informed consent was obtained, and patient was prepped using Betadine solution.  Lidocaine jelly was administered per urethral meatus.    Procedure: - Flexible cystoscope introduced, without any difficulty.   - Thorough search of the bladder revealed:    normal urethral meatus    Some mild bullous edema along the trigone and posterior bladder wall consistent with catheter cystitis with some scant debris.  No underlying tumors appreciated.    no stones    no ulcers     no tumors    no urethral polyps    no trabeculation  - Ureteral orifices were normal in position and appearance.  Post-Procedure: - Patient tolerated the procedure well  Keflex given x 1 today  Assessment/ Plan:  1. Gross hematuria No underlying bladder pathology, findings consistent with catheter cystitis  Suspect hematuria related to catheter irritation  Upper tract imaging has been unremarkable - cephALEXin (KEFLEX) capsule 500 mg  2. Urinary retention Catheter removed for cystoscopy today, bladder filled and she was able to void albeit with mildly elevated PVR of 181  Return later today for  recheck    Vanna Scotland, MD

## 2022-06-28 ENCOUNTER — Encounter: Payer: Self-pay | Admitting: Family Medicine

## 2022-06-28 DIAGNOSIS — N179 Acute kidney failure, unspecified: Secondary | ICD-10-CM | POA: Diagnosis not present

## 2022-06-28 DIAGNOSIS — N136 Pyonephrosis: Secondary | ICD-10-CM | POA: Diagnosis not present

## 2022-06-28 DIAGNOSIS — B964 Proteus (mirabilis) (morganii) as the cause of diseases classified elsewhere: Secondary | ICD-10-CM | POA: Diagnosis not present

## 2022-06-28 DIAGNOSIS — I1310 Hypertensive heart and chronic kidney disease without heart failure, with stage 1 through stage 4 chronic kidney disease, or unspecified chronic kidney disease: Secondary | ICD-10-CM | POA: Diagnosis not present

## 2022-06-28 DIAGNOSIS — S32010D Wedge compression fracture of first lumbar vertebra, subsequent encounter for fracture with routine healing: Secondary | ICD-10-CM | POA: Diagnosis not present

## 2022-06-28 DIAGNOSIS — I2510 Atherosclerotic heart disease of native coronary artery without angina pectoris: Secondary | ICD-10-CM | POA: Diagnosis not present

## 2022-06-28 DIAGNOSIS — E1122 Type 2 diabetes mellitus with diabetic chronic kidney disease: Secondary | ICD-10-CM | POA: Diagnosis not present

## 2022-06-28 DIAGNOSIS — D631 Anemia in chronic kidney disease: Secondary | ICD-10-CM | POA: Diagnosis not present

## 2022-06-28 DIAGNOSIS — N1831 Chronic kidney disease, stage 3a: Secondary | ICD-10-CM | POA: Diagnosis not present

## 2022-06-28 NOTE — Telephone Encounter (Signed)
We need to talk about her symptoms and options for treatment, optimally in person so I can check vitals but if that is impossible- virtual  Please call her and schedule an appt

## 2022-06-29 NOTE — Telephone Encounter (Signed)
Patient scheduled on 6/13

## 2022-07-03 ENCOUNTER — Telehealth: Payer: Self-pay | Admitting: Family Medicine

## 2022-07-03 ENCOUNTER — Telehealth: Payer: Self-pay

## 2022-07-03 DIAGNOSIS — E1122 Type 2 diabetes mellitus with diabetic chronic kidney disease: Secondary | ICD-10-CM

## 2022-07-03 DIAGNOSIS — N1832 Chronic kidney disease, stage 3b: Secondary | ICD-10-CM

## 2022-07-03 DIAGNOSIS — E1169 Type 2 diabetes mellitus with other specified complication: Secondary | ICD-10-CM

## 2022-07-03 DIAGNOSIS — I10 Essential (primary) hypertension: Secondary | ICD-10-CM

## 2022-07-03 DIAGNOSIS — E785 Hyperlipidemia, unspecified: Secondary | ICD-10-CM

## 2022-07-03 DIAGNOSIS — Z79899 Other long term (current) drug therapy: Secondary | ICD-10-CM

## 2022-07-03 NOTE — Telephone Encounter (Signed)
-----   Message from Alvina Chou sent at 06/20/2022  2:17 PM EDT ----- Regarding: Lab orders for Tuesday, 6.11.24 Patient is scheduled for CPX labs, please order future labs, Thanks , Terri  There are two orders, checking if you want more.

## 2022-07-03 NOTE — Progress Notes (Signed)
Care Management & Coordination Services Pharmacy Team  Reason for Encounter: Appointment Reminder  Contacted patient to confirm telephone appointment with Al Corpus, PharmD on 07/04/22 at 3:00. Spoke with patient on 07/03/2022   Do you have any problems getting your medications? No  What is your top health concern you would like to discuss at your upcoming visit?  Uncomfortable sleep   Have you seen any other providers since your last visit with PCP? Yes- neurosurgery,urology   Hospital visits:  None in previous 6 months   Star Rating Drugs:  Medication:  Last Fill: Day Supply Atorvastatin 10mg  05/20/22 90 Losartan 25mg  05/25/22  90 Mounjaro Insulin 70/30  05/24/22  57   Care Gaps: Annual wellness visit in last year? Yes  If Diabetic: Last eye exam / retinopathy screening:UTD Last diabetic foot exam:UTD   Al Corpus, PharmD notified  Burt Knack, [facility] Clinical Pharmacy Assistant [phone]

## 2022-07-04 ENCOUNTER — Ambulatory Visit: Payer: Medicare PPO | Admitting: Pharmacist

## 2022-07-04 ENCOUNTER — Other Ambulatory Visit (INDEPENDENT_AMBULATORY_CARE_PROVIDER_SITE_OTHER): Payer: Medicare PPO

## 2022-07-04 ENCOUNTER — Encounter: Payer: Self-pay | Admitting: Family Medicine

## 2022-07-04 DIAGNOSIS — E1122 Type 2 diabetes mellitus with diabetic chronic kidney disease: Secondary | ICD-10-CM

## 2022-07-04 DIAGNOSIS — I10 Essential (primary) hypertension: Secondary | ICD-10-CM

## 2022-07-04 DIAGNOSIS — E1169 Type 2 diabetes mellitus with other specified complication: Secondary | ICD-10-CM

## 2022-07-04 DIAGNOSIS — I1310 Hypertensive heart and chronic kidney disease without heart failure, with stage 1 through stage 4 chronic kidney disease, or unspecified chronic kidney disease: Secondary | ICD-10-CM | POA: Diagnosis not present

## 2022-07-04 DIAGNOSIS — N1832 Chronic kidney disease, stage 3b: Secondary | ICD-10-CM

## 2022-07-04 DIAGNOSIS — E785 Hyperlipidemia, unspecified: Secondary | ICD-10-CM | POA: Diagnosis not present

## 2022-07-04 DIAGNOSIS — I2510 Atherosclerotic heart disease of native coronary artery without angina pectoris: Secondary | ICD-10-CM | POA: Diagnosis not present

## 2022-07-04 DIAGNOSIS — Z79899 Other long term (current) drug therapy: Secondary | ICD-10-CM | POA: Diagnosis not present

## 2022-07-04 DIAGNOSIS — B964 Proteus (mirabilis) (morganii) as the cause of diseases classified elsewhere: Secondary | ICD-10-CM | POA: Diagnosis not present

## 2022-07-04 DIAGNOSIS — N136 Pyonephrosis: Secondary | ICD-10-CM | POA: Diagnosis not present

## 2022-07-04 DIAGNOSIS — N184 Chronic kidney disease, stage 4 (severe): Secondary | ICD-10-CM

## 2022-07-04 DIAGNOSIS — D631 Anemia in chronic kidney disease: Secondary | ICD-10-CM | POA: Diagnosis not present

## 2022-07-04 DIAGNOSIS — N179 Acute kidney failure, unspecified: Secondary | ICD-10-CM | POA: Diagnosis not present

## 2022-07-04 DIAGNOSIS — D72829 Elevated white blood cell count, unspecified: Secondary | ICD-10-CM

## 2022-07-04 DIAGNOSIS — E538 Deficiency of other specified B group vitamins: Secondary | ICD-10-CM | POA: Insufficient documentation

## 2022-07-04 DIAGNOSIS — S32010D Wedge compression fracture of first lumbar vertebra, subsequent encounter for fracture with routine healing: Secondary | ICD-10-CM | POA: Diagnosis not present

## 2022-07-04 DIAGNOSIS — N1831 Chronic kidney disease, stage 3a: Secondary | ICD-10-CM | POA: Diagnosis not present

## 2022-07-04 LAB — BASIC METABOLIC PANEL
BUN: 14 mg/dL (ref 6–23)
CO2: 22 mEq/L (ref 19–32)
Calcium: 9.3 mg/dL (ref 8.4–10.5)
Chloride: 106 mEq/L (ref 96–112)
Creatinine, Ser: 1.28 mg/dL — ABNORMAL HIGH (ref 0.40–1.20)
GFR: 43.18 mL/min — ABNORMAL LOW (ref >60.00)
Glucose, Bld: 164 mg/dL — ABNORMAL HIGH (ref 70–99)
Potassium: 4.2 mEq/L (ref 3.5–5.1)
Sodium: 138 mEq/L (ref 135–145)

## 2022-07-04 LAB — COMPREHENSIVE METABOLIC PANEL
ALT: 15 U/L (ref 0–35)
AST: 24 U/L (ref 0–37)
Albumin: 3.8 g/dL (ref 3.5–5.2)
Alkaline Phosphatase: 92 U/L (ref 39–117)
BUN: 14 mg/dL (ref 6–23)
CO2: 22 mEq/L (ref 19–32)
Calcium: 9.3 mg/dL (ref 8.4–10.5)
Chloride: 106 mEq/L (ref 96–112)
Creatinine, Ser: 1.28 mg/dL — ABNORMAL HIGH (ref 0.40–1.20)
GFR: 43.18 mL/min — ABNORMAL LOW (ref >60.00)
Glucose, Bld: 164 mg/dL — ABNORMAL HIGH (ref 70–99)
Potassium: 4.2 mEq/L (ref 3.5–5.1)
Sodium: 138 mEq/L (ref 135–145)
Total Bilirubin: 0.6 mg/dL (ref 0.2–1.2)
Total Protein: 6.9 g/dL (ref 6.0–8.3)

## 2022-07-04 LAB — CBC WITH DIFFERENTIAL/PLATELET
Basophils Absolute: 0 10*3/uL (ref 0.0–0.1)
Basophils Relative: 0.6 % (ref 0.0–3.0)
Eosinophils Absolute: 0.3 10*3/uL (ref 0.0–0.7)
Eosinophils Relative: 3.7 % (ref 0.0–5.0)
HCT: 38.9 % (ref 36.0–46.0)
Hemoglobin: 12.4 g/dL (ref 12.0–15.0)
Lymphocytes Relative: 27.6 % (ref 12.0–46.0)
Lymphs Abs: 2.2 10*3/uL (ref 0.7–4.0)
MCHC: 32 g/dL (ref 30.0–36.0)
MCV: 94.8 fl (ref 78.0–100.0)
Monocytes Absolute: 0.6 10*3/uL (ref 0.1–1.0)
Monocytes Relative: 7.4 % (ref 3.0–12.0)
Neutro Abs: 4.7 10*3/uL (ref 1.4–7.7)
Neutrophils Relative %: 60.7 % (ref 43.0–77.0)
Platelets: 289 10*3/uL (ref 150.0–400.0)
RBC: 4.1 Mil/uL (ref 3.87–5.11)
RDW: 14.3 % (ref 11.5–15.5)
WBC: 7.8 10*3/uL (ref 4.0–10.5)

## 2022-07-04 LAB — LDL CHOLESTEROL, DIRECT: Direct LDL: 50 mg/dL

## 2022-07-04 LAB — VITAMIN D 25 HYDROXY (VIT D DEFICIENCY, FRACTURES): VITD: 45.62 ng/mL (ref 30.00–100.00)

## 2022-07-04 LAB — LIPID PANEL
Cholesterol: 119 mg/dL (ref 0–200)
HDL: 52.8 mg/dL (ref >39.00)
NonHDL: 65.85
Total CHOL/HDL Ratio: 2
Triglycerides: 215 mg/dL — ABNORMAL HIGH (ref 0.0–149.0)
VLDL: 43 mg/dL — ABNORMAL HIGH (ref 0.0–40.0)

## 2022-07-04 LAB — HEMOGLOBIN A1C: Hgb A1c MFr Bld: 6.8 % — ABNORMAL HIGH (ref 4.6–6.5)

## 2022-07-04 LAB — VITAMIN B12: Vitamin B-12: 121 pg/mL — ABNORMAL LOW (ref 211–911)

## 2022-07-04 LAB — TSH: TSH: 1.97 u[IU]/mL (ref 0.35–5.50)
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
## 2022-07-06 ENCOUNTER — Ambulatory Visit (INDEPENDENT_AMBULATORY_CARE_PROVIDER_SITE_OTHER): Payer: Medicare PPO | Admitting: *Deleted

## 2022-07-06 ENCOUNTER — Ambulatory Visit: Payer: Medicare PPO | Admitting: Family Medicine

## 2022-07-06 DIAGNOSIS — E538 Deficiency of other specified B group vitamins: Secondary | ICD-10-CM

## 2022-07-06 MED ORDER — CYANOCOBALAMIN 1000 MCG/ML IJ SOLN
1000.0000 ug | Freq: Once | INTRAMUSCULAR | Status: AC
Start: 2022-07-06 — End: 2022-07-06
  Administered 2022-07-06: 1000 ug via INTRAMUSCULAR

## 2022-07-06 NOTE — Progress Notes (Signed)
Per orders of Dr. Kerby Nora (PCP out of the office), injection of B12 given by Blenda Mounts M in right deltoid.

## 2022-07-11 ENCOUNTER — Ambulatory Visit (INDEPENDENT_AMBULATORY_CARE_PROVIDER_SITE_OTHER): Payer: Medicare PPO | Admitting: Family Medicine

## 2022-07-11 ENCOUNTER — Ambulatory Visit: Payer: Medicare PPO | Admitting: Physician Assistant

## 2022-07-11 ENCOUNTER — Encounter: Payer: Self-pay | Admitting: Physician Assistant

## 2022-07-11 ENCOUNTER — Encounter: Payer: Self-pay | Admitting: Family Medicine

## 2022-07-11 VITALS — BP 124/66 | HR 97 | Ht 65.0 in | Wt 251.0 lb

## 2022-07-11 VITALS — BP 130/74 | HR 92 | Temp 97.8°F | Ht 65.0 in | Wt 251.5 lb

## 2022-07-11 DIAGNOSIS — I700 Atherosclerosis of aorta: Secondary | ICD-10-CM

## 2022-07-11 DIAGNOSIS — E1122 Type 2 diabetes mellitus with diabetic chronic kidney disease: Secondary | ICD-10-CM

## 2022-07-11 DIAGNOSIS — L292 Pruritus vulvae: Secondary | ICD-10-CM | POA: Diagnosis not present

## 2022-07-11 DIAGNOSIS — Z1231 Encounter for screening mammogram for malignant neoplasm of breast: Secondary | ICD-10-CM

## 2022-07-11 DIAGNOSIS — E538 Deficiency of other specified B group vitamins: Secondary | ICD-10-CM

## 2022-07-11 DIAGNOSIS — R339 Retention of urine, unspecified: Secondary | ICD-10-CM

## 2022-07-11 DIAGNOSIS — K51019 Ulcerative (chronic) pancolitis with unspecified complications: Secondary | ICD-10-CM

## 2022-07-11 DIAGNOSIS — Z0000 Encounter for general adult medical examination without abnormal findings: Secondary | ICD-10-CM

## 2022-07-11 DIAGNOSIS — N1832 Chronic kidney disease, stage 3b: Secondary | ICD-10-CM | POA: Diagnosis not present

## 2022-07-11 DIAGNOSIS — I10 Essential (primary) hypertension: Secondary | ICD-10-CM | POA: Diagnosis not present

## 2022-07-11 DIAGNOSIS — N184 Chronic kidney disease, stage 4 (severe): Secondary | ICD-10-CM

## 2022-07-11 DIAGNOSIS — Z7985 Long-term (current) use of injectable non-insulin antidiabetic drugs: Secondary | ICD-10-CM | POA: Diagnosis not present

## 2022-07-11 DIAGNOSIS — E1169 Type 2 diabetes mellitus with other specified complication: Secondary | ICD-10-CM

## 2022-07-11 DIAGNOSIS — N3000 Acute cystitis without hematuria: Secondary | ICD-10-CM

## 2022-07-11 DIAGNOSIS — Z79899 Other long term (current) drug therapy: Secondary | ICD-10-CM

## 2022-07-11 DIAGNOSIS — E785 Hyperlipidemia, unspecified: Secondary | ICD-10-CM

## 2022-07-11 DIAGNOSIS — R310 Gross hematuria: Secondary | ICD-10-CM | POA: Diagnosis not present

## 2022-07-11 DIAGNOSIS — E2839 Other primary ovarian failure: Secondary | ICD-10-CM

## 2022-07-11 LAB — URINALYSIS, COMPLETE
Bilirubin, UA: NEGATIVE
Glucose, UA: NEGATIVE
Ketones, UA: NEGATIVE
Nitrite, UA: NEGATIVE
Specific Gravity, UA: 1.015 (ref 1.005–1.030)
Urobilinogen, Ur: 0.2 mg/dL (ref 0.2–1.0)
pH, UA: 5.5 (ref 5.0–7.5)

## 2022-07-11 LAB — MICROSCOPIC EXAMINATION: WBC, UA: >30 /hpf — AB (ref 0–5)

## 2022-07-11 LAB — BLADDER SCAN AMB NON-IMAGING: Scan Result: 61

## 2022-07-11 MED ORDER — CEPHALEXIN 500 MG PO CAPS
500.0000 mg | ORAL_CAPSULE | Freq: Two times a day (BID) | ORAL | 0 refills | Status: AC
Start: 2022-07-11 — End: 2022-07-16

## 2022-07-11 MED ORDER — ZOLPIDEM TARTRATE 10 MG PO TABS: 5.0000 mg | ORAL_TABLET | Freq: Every evening | ORAL | 3 refills | Status: AC | PRN

## 2022-07-11 MED ORDER — TIRZEPATIDE 7.5 MG/0.5ML ~~LOC~~ SOAJ: 7.5000 mg | SUBCUTANEOUS | 0 refills | Status: DC

## 2022-07-11 MED ORDER — FLUCONAZOLE 150 MG PO TABS
150.0000 mg | ORAL_TABLET | Freq: Once | ORAL | 0 refills | Status: AC
Start: 2022-07-11 — End: 2022-07-11

## 2022-07-11 NOTE — Assessment & Plan Note (Signed)
S/p colectomy  No problems with ostomy lately

## 2022-07-11 NOTE — Progress Notes (Signed)
Subjective:    Patient ID: Jamie [name], female    DOB: December 26, 1955, 67 y.o.   MRN: [id number]  HPI  Here for health maintenance exam and to review chronic medical problems   Wt Readings from Last 3 Encounters:  07/11/22 251 lb 8 oz (114.1 kg)  06/22/22 250 lb (113.4 kg)  05/30/22 250 lb 8 oz (113.6 kg)   41.85 kg/m  Vitals:   07/11/22 0956  BP: 130/74  Pulse: 92  Temp: 97.8 F (36.6 C)  SpO2: 99%    Immunization History  Administered Date(s) Administered   Influenza Inj Mdck Quad Pf 10/02/2018, 12/13/2018   Influenza Split 10/13/2010, 10/02/2011, 10/23/2013   Influenza Whole 12/13/2006, 11/04/2009   Influenza, High Dose Seasonal PF 09/29/2019, 10/12/2020, 10/25/2021   Influenza,inj,Quad PF,6+ Mos 10/02/2012, 11/04/2014, 10/11/2017, 10/02/2018   Influenza-Unspecified 10/08/2015, 10/02/2016, 09/29/2019   Moderna Covid-19 Vaccine Bivalent Booster 53yrs & up 10/12/2020   PFIZER Comirnaty(Gray Top)Covid-19 Tri-Sucrose Vaccine 05/31/2020   PFIZER(Purple Top)SARS-COV-2 Vaccination 04/14/2019, 05/05/2019, 11/18/2019   PNEUMOCOCCAL CONJUGATE-20 12/08/2021   Pfizer Covid-19 Vaccine Bivalent Booster 61yrs & up 10/25/2021   Pneumococcal Polysaccharide-23 12/09/2013   Respiratory Syncytial Virus Vaccine,Recomb Aduvanted(Arexvy) 12/08/2021   Td 01/23/2002   Tdap 10/02/2012   Zoster Recombinat (Shingrix) 10/25/2019, 01/06/2020   Zoster, Live 01/08/2015    Health Maintenance Due  Topic Date Due   Medicare Annual Wellness (AWV)  06/01/2022     Mammogram 07/2021-wants to do that  Self breast exam: no lumps    Gyn care/ pap does not see gyn    Colon cancer screening -none, colectomy for UC in past    Dexa 12/2019 osteopenia  Falls- one  Fractures- vertebral  Supplements  Last vitamin D Lab Results  Component Value Date   VD25OH 45.62 07/04/2022    Exercise : some walking / more than she had  About 15 minutes-working up  Husband goes with  Restricted to not  bend /twist or lift over 25 lb    Derm care: - had a visit in April  Had a good exam    Mood    07/11/2022   11:00 AM 05/30/2022   10:43 AM 04/07/2022   12:28 PM 02/06/2022    8:10 AM 05/31/2021   10:35 AM  Depression screen PHQ 2/9  Decreased Interest 0 0 0 0 0  Down, Depressed, Hopeless 1 0 0 0 0  PHQ - 2 Score 1 0 0 0 0  Altered sleeping 2 1 0 0   Tired, decreased energy 0 0 0 0   Change in appetite 1 0 0 0   Feeling bad or failure about yourself  0 0 0 0   Trouble concentrating 1 1 0 0   Moving slowly or fidgety/restless 0 0 0 0   Suicidal thoughts 0 0 0 0   PHQ-9 Score 5 2 0 0   Difficult doing work/chores Not difficult at all Not difficult at all Not difficult at all Not difficult at all      Needs refill of ambien Cannot go without it    HTN bp is stable today  No cp or palpitations or headaches or edema  No side effects to medicines  BP Readings from Last 3 Encounters:  07/11/22 130/74  06/27/22 (!) 141/75  06/22/22 (!) 116/57    Losartan 25 mg daily  Metoprolol 25 mg daily  Sees nephrology   No pain from back as a rule   Sees urology this afternoon  Thinks  she has uti - frequent urination/ urgency / not burning but last night some bladder discomfort  Still has 2 small stones in L kidney  2 weeks off catheter    CMP     Component Value Date/Time   NA 138 07/04/2022 0818   NA 138 07/04/2022 0818   NA 137 01/09/2014 1613   K 4.2 07/04/2022 0818   K 4.2 07/04/2022 0818   K 4.3 01/09/2014 1613   CL 106 07/04/2022 0818   CL 106 07/04/2022 0818   CL 105 01/09/2014 1613   CO2 22 07/04/2022 0818   CO2 22 07/04/2022 0818   CO2 22 01/09/2014 1613   GLUCOSE 164 (H) 07/04/2022 0818   GLUCOSE 164 (H) 07/04/2022 0818   GLUCOSE 142 (H) 01/09/2014 1613   BUN 14 07/04/2022 0818   BUN 14 07/04/2022 0818   BUN 13 01/09/2014 1613   CREATININE 1.28 (H) 07/04/2022 0818   CREATININE 1.28 (H) 07/04/2022 0818   CREATININE 1.30 (H) 06/09/2022 1432   CALCIUM 9.3  07/04/2022 0818   CALCIUM 9.3 07/04/2022 0818   CALCIUM 9.4 01/09/2014 1613   PROT 6.9 07/04/2022 0818   PROT 8.1 01/09/2014 1613   ALBUMIN 3.8 07/04/2022 0818   ALBUMIN 3.7 01/09/2014 1613   AST 24 07/04/2022 0818   AST 43 (H) 01/09/2014 1613   ALT 15 07/04/2022 0818   ALT 55 01/09/2014 1613   ALKPHOS 92 07/04/2022 0818   ALKPHOS 100 01/09/2014 1613   BILITOT 0.6 07/04/2022 0818   BILITOT 0.4 01/09/2014 1613   GFR 43.18 (L) 07/04/2022 0818   GFR 43.18 (L) 07/04/2022 0818   GFRNONAA 38 (L) 05/24/2022 0529   GFRNONAA 55 (L) 01/09/2014 1613  Fairly stable  Next visit with nephrology in July   DM2 with nephropathy Lab Results  Component Value Date   HGBA1C 6.8 (H) 07/04/2022   Mounjaro 5 mg weekly Doing ok overall  Holding insulin  Holding gGFR 43.lipizide    Wearing CGM     Hyperlipidemia Lab Results  Component Value Date   CHOL 119 07/04/2022   CHOL 129 01/31/2022   CHOL 131 07/28/2021   Lab Results  Component Value Date   HDL 52.80 07/04/2022   HDL 57.20 01/31/2022   HDL 53.30 07/28/2021   Lab Results  Component Value Date   LDLCALC 35 01/31/2022   LDLCALC 40 07/28/2021   LDLCALC 15 12/13/2020   Lab Results  Component Value Date   TRIG 215.0 (H) 07/04/2022   TRIG 182.0 (H) 01/31/2022   TRIG 190.0 (H) 07/28/2021   Lab Results  Component Value Date   CHOLHDL 2 07/04/2022   CHOLHDL 2 01/31/2022   CHOLHDL 2 07/28/2021   Lab Results  Component Value Date   LDLDIRECT 50.0 07/04/2022   LDLDIRECT 62.0 11/06/2019   LDLDIRECT 64.0 05/08/2019   Atorvastatin 5 mg every other day    H/o low B12 Lab Results  Component Value Date   VITAMINB12 121 (L) 07/04/2022  Had a B12 shot on 6/13  Not much energy change yet    GERD nexium 20 mg every other day  Hearing is still a problem Still roaring sound R ear Went to audiology- has some hearing loss - but not enough to be concenred  Put on antihistamine nightly and flonase bid   Lab Results   Component Value Date   WBC 7.8 07/04/2022   HGB 12.4 07/04/2022   HCT 38.9 07/04/2022   MCV 94.8 07/04/2022   PLT 289.0  07/04/2022   Lab Results  Component Value Date   TSH 1.97 07/04/2022      Patient Active Problem List   Diagnosis Date Noted   Encounter for screening mammogram for breast cancer 07/11/2022   Estrogen deficiency 07/11/2022   Low serum vitamin B12 07/04/2022   Current use of proton pump inhibitor 07/03/2022   Low magnesium level 06/06/2022   Closed tricolumnar fracture of lumbar vertebra (HCC) 05/12/2022   Compression fracture of L1 lumbar vertebra (HCC) 05/11/2022   Morbid obesity (HCC) 05/11/2022   Fall at home, initial encounter 05/11/2022   Chronic kidney disease, stage 3a (HCC) 05/11/2022   Closed fracture of first lumbar vertebra (HCC) 05/11/2022   Closed fracture of twelfth thoracic vertebra (HCC) 05/11/2022   Spinal instability, lumbar 05/11/2022   Hearing loss, right 04/07/2022   Sinus congestion 04/07/2022   Abnormal SPEP 04/28/2021   Abnormal EKG 04/26/2021   CKD (chronic kidney disease) stage 3, GFR 30-59 ml/min (HCC) 05/31/2020   Aortic atherosclerosis (HCC) 03/01/2020   Elevated serum creatinine 05/14/2019   Dermatitis 07/01/2018   Left shoulder pain 11/30/2017   Palpitations 07/05/2017   Lichen planus 04/09/2017   Hyperlipidemia associated with type 2 diabetes mellitus (HCC) 04/10/2016   Red blood cell antibody positive 07/02/2013   Elevated C-reactive protein (CRP) 05/15/2012   Routine general medical examination at a health care facility 09/24/2011   Apnea 11/14/2010   Stress reaction, emotional 05/02/2010   Compulsive overeater 05/02/2010   Arthritis    H/O small bowel obstruction    KNEE PAIN 10/14/2009   DM (diabetes mellitus), type 2 with renal complications (HCC) 04/23/2009   ROSACEA 09/12/2007   Essential hypertension 05/15/2007   OSA (obstructive sleep apnea) 12/13/2006   Ulcerative colitis (HCC) 07/12/2006   Past  Medical History:  Diagnosis Date   Allergy 1970s   Sulfa drugs   Anemia    Arthritis    generalized   Blood transfusion without reported diagnosis 1979   Bowel obstruction (HCC)    repeated (? possible adhesions) neg EGD   Cataract ~2018   Not ready for surgery yet   Chronic kidney disease    Colitis    with colectomy   Diabetes mellitus without complication (HCC)    Dyspnea    covid   Gall stones 01/23/2006   Gastroenteritis 07/25/2014   hosp for IVF    GERD (gastroesophageal reflux disease)    History of kidney stones    HTN (hypertension)    Hyperglycemia    mild-monitors A1C   Obesity    Pneumonia    Rosacea    Sleep apnea    CPAP everynight   Past Surgical History:  Procedure Laterality Date   APPENDECTOMY  08-1991   Along with colon removal   CHOLECYSTECTOMY     COLECTOMY  1993   has ileostomy   COLON SURGERY  2021   removed rectum   COLONOSCOPY  08/1991   COLOSTOMY REVISION  12/06/2011   Procedure: COLOSTOMY REVISION;  Surgeon: Romie Levee, MD;  Location: [facility];  Service: General;  Laterality: Right;  OSTOMY revision   CYSTOSCOPY/URETEROSCOPY/HOLMIUM LASER/STENT PLACEMENT Left 04/11/2021   Procedure: CYSTOSCOPY/URETEROSCOPY/HOLMIUM LASER/STENT PLACEMENT;  Surgeon: Vanna Scotland, MD;  Location: [facility];  Service: Urology;  Laterality: Left;   EXAMINATION UNDER ANESTHESIA  12/06/2011   Procedure: EXAM UNDER ANESTHESIA;  Surgeon: Romie Levee, MD;  Location: [facility];  Service: General;  Laterality: N/A;  rectal exam under anesthesia, anal dilation,  pouchoscopy     SPINE SURGERY  05/12/2022   2 fractured vertebra   TOTAL ABDOMINAL HYSTERECTOMY  05/2006   for abscessed ovaries   Social History   Tobacco Use   Smoking status: Never   Smokeless tobacco: Never  Vaping Use   Vaping Use: Never used  Substance Use Topics   Alcohol use: No   Drug use: Never   Family History  Problem Relation Age of Onset    Hypertension Mother    Hyperlipidemia Mother    Cirrhosis Mother        NASH   Diabetes Mother    Liver cancer Father        resection secondary to mets   Cancer Father        colon   Hyperlipidemia Father    Hypertension Father    Allergies Father    Ulcerative colitis Father    Arthritis Father    Allergies Brother    Gastric cancer Brother    Cancer Brother    Breast cancer Paternal Grandmother    Breast cancer Cousin 5       paternal cousins   ADD / ADHD Son    Allergies  Allergen Reactions   Ciprofloxacin Rash    Erythema and pruritis around IV site, erythema and rash along vein   Other Rash   Jardiance [Empagliflozin]     Increase in Cr    Lisinopril     REACTION: felt bad/ stomach hurt/ weak and tired   Metformin And Related     GI   Sulfa Antibiotics Rash   Current Outpatient Medications on File Prior to Visit  Medication Sig Dispense Refill   ACCU-CHEK GUIDE test strip TO CHECK GLUCOSE DAILY AND AS NEEDED FOR TYPE 2 DIABETES 100 strip 1   acetaminophen (TYLENOL) 325 MG tablet Take 650 mg by mouth every 6 (six) hours as needed for moderate pain.     atorvastatin (LIPITOR) 10 MG tablet TAKE 1/2 PILL BY MOUTH EVERY OTHER DAY (Patient taking differently: Take 5 mg by mouth every other day.) 24 tablet 1   Blood Glucose Monitoring Suppl (ACCU-CHEK GUIDE) w/Device KIT To check glucose daily and as needed for type 2 diabetes 1 kit 0   Cholecalciferol (VITAMIN D3) 25 MCG (1000 UT) CAPS Take 1,000 Units by mouth daily.     Continuous Glucose Sensor (FREESTYLE LIBRE 3 SENSOR) MISC Use as directed for diabetes type 2 requiring insulin 6 each 3   esomeprazole (NEXIUM) 20 MG capsule Take 20 mg by mouth every other day.     fluticasone (FLONASE) 50 MCG/ACT nasal spray Place 2 sprays into both nostrils at bedtime.     Lancets (ACCU-CHEK MULTICLIX) lancets To check glucose daily and as needed for diabetes type 2 100 each 3   losartan (COZAAR) 25 MG tablet Take 25 mg by mouth  daily.     metoprolol tartrate (LOPRESSOR) 25 MG tablet TAKE 1 TABLET BY MOUTH EVERY DAY 90 tablet 0   Multiple Vitamin (MULTIVITAMIN) tablet Take 1 tablet by mouth daily.     NON FORMULARY SLEEPS WITH BI-PAP     simethicone (MYLICON) 80 MG chewable tablet Chew 1 tablet (80 mg total) by mouth 4 (four) times daily as needed for flatulence. 30 tablet 0   No current facility-administered medications on file prior to visit.    Review of Systems  Constitutional:  Positive for fatigue. Negative for activity change, appetite change, fever and unexpected weight change.  HENT:  Negative for congestion, ear pain, rhinorrhea, sinus pressure and sore throat.   Eyes:  Negative for pain, redness and visual disturbance.  Respiratory:  Negative for cough, shortness of breath and wheezing.   Cardiovascular:  Negative for chest pain and palpitations.  Gastrointestinal:  Negative for abdominal pain, blood in stool, constipation and diarrhea.  Endocrine: Negative for polydipsia and polyuria.  Genitourinary:  Negative for dysuria, frequency and urgency.  Musculoskeletal:  Positive for back pain. Negative for arthralgias and myalgias.  Skin:  Negative for pallor and rash.  Allergic/Immunologic: Negative for environmental allergies.  Neurological:  Negative for dizziness, syncope and headaches.  Hematological:  Negative for adenopathy. Does not bruise/bleed easily.  Psychiatric/Behavioral:  Positive for sleep disturbance. Negative for decreased concentration and dysphoric mood. The patient is not nervous/anxious.        Objective:   Physical Exam Constitutional:      General: She is not in acute distress.    Appearance: Normal appearance. She is well-developed. She is obese. She is not ill-appearing or diaphoretic.  HENT:     Head: Normocephalic and atraumatic.     Right Ear: Tympanic membrane, ear canal and external ear normal.     Left Ear: Tympanic membrane, ear canal and external ear normal.      Nose: Nose normal. No congestion.     Mouth/Throat:     Mouth: Mucous membranes are moist.     Pharynx: Oropharynx is clear. No posterior oropharyngeal erythema.  Eyes:     General: No scleral icterus.    Extraocular Movements: Extraocular movements intact.     Conjunctiva/sclera: Conjunctivae normal.     Pupils: Pupils are equal, round, and reactive to light.  Neck:     Thyroid: No thyromegaly.     Vascular: No carotid bruit or JVD.  Cardiovascular:     Rate and Rhythm: Normal rate and regular rhythm.     Pulses: Normal pulses.     Heart sounds: Normal heart sounds.     No gallop.  Pulmonary:     Effort: Pulmonary effort is normal. No respiratory distress.     Breath sounds: Normal breath sounds. No wheezing.     Comments: Good air exch Chest:     Chest wall: No tenderness.  Abdominal:     General: Bowel sounds are normal. There is no distension or abdominal bruit.     Palpations: Abdomen is soft. There is no mass.     Tenderness: There is no abdominal tenderness.     Hernia: No hernia is present.     Comments: Ostomy site looks healthy  Genitourinary:    Comments: Breast exam: No mass, nodules, thickening, tenderness, bulging, retraction, inflamation, nipple discharge or skin changes noted.  No axillary or clavicular LA.     Musculoskeletal:        General: No tenderness. Normal range of motion.     Cervical back: Normal range of motion and neck supple. No rigidity. No muscular tenderness.     Right lower leg: No edema.     Left lower leg: No edema.     Comments: No kyphosis   Limited rom spine  Lymphadenopathy:     Cervical: No cervical adenopathy.  Skin:    General: Skin is warm and dry.     Coloration: Skin is not pale.     Findings: No erythema or rash.     Comments: Scars noted from recent back surgery   Solar lentigines diffusely    Neurological:  Mental Status: She is alert. Mental status is at baseline.     Cranial Nerves: No cranial nerve deficit.      Motor: No abnormal muscle tone.     Coordination: Coordination normal.     Gait: Gait normal.     Deep Tendon Reflexes: Reflexes are normal and symmetric.  Psychiatric:        Mood and Affect: Mood normal.        Cognition and Memory: Cognition and memory normal.           Assessment & Plan:   Problem List Items Addressed This Visit       Cardiovascular and Mediastinum   Essential hypertension    bp in fair control at this time (after hospitalization) BP Readings from Last 1 Encounters:  07/11/22 130/74  No changes needed Most recent labs reviewed  Disc lifstyle change with low sodium diet and exercise  Plan to continue  Losartan 25 mg daily  Metoprolol 25 mg daily  Nephrology f/u planned      Relevant Orders   Hemoglobin A1c   Aortic atherosclerosis (HCC)    On statin Good bp control  No clinical changes or symptoms        Digestive   Ulcerative colitis (HCC)    S/p colectomy  No problems with ostomy lately        Endocrine   Hyperlipidemia associated with type 2 diabetes mellitus (HCC)    Disc goals for lipids and reasons to control them Rev last labs with pt Rev low sat fat diet in detail Atorvastatin 5 mg every othe day  Good LDL control at 35 Expect trig to improve more as blood glucose does       Relevant Medications   tirzepatide (MOUNJARO) 7.5 MG/0.5ML Pen   DM (diabetes mellitus), type 2 with renal complications (HCC)    Lab Results  Component Value Date   HGBA1C 6.8 (H) 07/04/2022  This went up a bit after spinal injury  Plan to increase mounjaro to 7.5 mg weekly and report back  Holding insulin and glipizide   Eye exam utd  Normal foot exam Taking statin  Sees nephrology  Follow up 3 mo with lab prior      Relevant Medications   tirzepatide (MOUNJARO) 7.5 MG/0.5ML Pen   Other Relevant Orders   Hemoglobin A1c   Basic metabolic panel     Genitourinary   CKD (chronic kidney disease) stage 3, GFR 30-59 ml/min (HCC)    Seeing  nephrology  Reviewed GFR 43.1  Follow up planned  Good water intake         Other   Routine general medical examination at a health care facility - Primary    Reviewed health habits including diet and exercise and skin cancer prevention Reviewed appropriate screening tests for age  Also reviewed health mt list, fam hx and immunization status , as well as social and family history   See HPI Labs reviewed and ordered Mammogram ordered  S/p colectomy /no colon cancer screening  Dexa ordered/noting spinal comp fracture  Discussed fall prevention and plan for exercise  Derm care utd  PHQ score of 2 for altered sleeping / ambien refilled       Morbid obesity (HCC)    Discussed how this problem influences overall health and the risks it imposes  Reviewed plan for weight loss with lower calorie diet (via better food choices and also portion control or program like weight watchers) and exercise  building up to or more than 30 minutes 5 days per week including some aerobic activity   Plan to go up on mounaro to 7.5 mg weekly         Relevant Medications   tirzepatide (MOUNJARO) 7.5 MG/0.5ML Pen   Low serum vitamin B12    New low B12 Lab Results  Component Value Date   VITAMINB12 121 (L) 07/04/2022  Will plan 4 weekly shots  Oral supplementation with 1000 mcg daily  Lab and follow up in 3 mo       Relevant Orders   Vitamin B12   Estrogen deficiency    Dexa ordered  Osteopenia and recent spinal comp fracture       Relevant Orders   DG Bone Density   Encounter for screening mammogram for breast cancer    Mammogram ordered No change in exam       Relevant Orders   MM 3D SCREENING MAMMOGRAM BILATERAL BREAST   Current use of proton pump inhibitor    Low B12  Will supplement   Takes ppi every other day

## 2022-07-11 NOTE — Assessment & Plan Note (Signed)
Reviewed health habits including diet and exercise and skin cancer prevention Reviewed appropriate screening tests for age  Also reviewed health mt list, fam hx and immunization status , as well as social and family history   See HPI Labs reviewed and ordered Mammogram ordered  S/p colectomy /no colon cancer screening  Dexa ordered/noting spinal comp fracture  Discussed fall prevention and plan for exercise  Derm care utd  PHQ score of 2 for altered sleeping / ambien refilled

## 2022-07-11 NOTE — Assessment & Plan Note (Signed)
Mammogram ordered No change in exam

## 2022-07-11 NOTE — Assessment & Plan Note (Signed)
Lab Results  Component Value Date   HGBA1C 6.8 (H) 07/04/2022   This went up a bit after spinal injury  Plan to increase mounjaro to 7.5 mg weekly and report back  Holding insulin and glipizide   Eye exam utd  Normal foot exam Taking statin  Sees nephrology  Follow up 3 mo with lab prior

## 2022-07-11 NOTE — Assessment & Plan Note (Signed)
New low B12 Lab Results  Component Value Date   VITAMINB12 121 (L) 07/04/2022   Will plan 4 weekly shots  Oral supplementation with 1000 mcg daily  Lab and follow up in 3 mo

## 2022-07-11 NOTE — Assessment & Plan Note (Signed)
On statin Good bp control  No clinical changes or symptoms

## 2022-07-11 NOTE — Progress Notes (Signed)
07/11/2022 2:40 PM   Jamie [name] [birth year] [id number]  CC: Chief Complaint  Patient presents with   Follow-up   HPI: Jamie [name] is a 67 y.o. female with recent history of urinary retention following T11-L3 posterior thoracic fusion with Dr. Marcell Barlow who had an equivocal voiding trial with Dr. Apolinar Junes 2 weeks ago and presents today for repeat PVR.  Today she reports she has had several days of frequency, urgency, feelings of incomplete bladder emptying, and shooting pain sensations starting in the urethra and extending into her bladder.  She feels this is consistent with past UTIs.  She denies fever, chills, nausea, or vomiting.  She has also been having some vulvovaginal irritation/pruritus.  In-office UA today positive for trace intact blood, 1+ protein, and 2+ leukocytes; urine microscopy with >30 WBCs/HPF and many bacteria. PVR 61mL.  PMH: Past Medical History:  Diagnosis Date   Allergy 1970s   Sulfa drugs   Anemia    Arthritis    generalized   Blood transfusion without reported diagnosis 1979   Bowel obstruction (HCC)    repeated (? possible adhesions) neg EGD   Cataract ~2018   Not ready for surgery yet   Chronic kidney disease    Colitis    with colectomy   Diabetes mellitus without complication (HCC)    Dyspnea    covid   Gall stones 01/23/2006   Gastroenteritis 07/25/2014   hosp for IVF    GERD (gastroesophageal reflux disease)    History of kidney stones    HTN (hypertension)    Hyperglycemia    mild-monitors A1C   Obesity    Pneumonia    Rosacea    Sleep apnea    CPAP everynight    Surgical History: Past Surgical History:  Procedure Laterality Date   APPENDECTOMY  08-1991   Along with colon removal   CHOLECYSTECTOMY     COLECTOMY  1993   has ileostomy   COLON SURGERY  2021   removed rectum   COLONOSCOPY  08/1991   COLOSTOMY REVISION  12/06/2011   Procedure: COLOSTOMY REVISION;  Surgeon: Romie Levee, MD;  Location: [facility];  Service: General;  Laterality: Right;  OSTOMY revision   CYSTOSCOPY/URETEROSCOPY/HOLMIUM LASER/STENT PLACEMENT Left 04/11/2021   Procedure: CYSTOSCOPY/URETEROSCOPY/HOLMIUM LASER/STENT PLACEMENT;  Surgeon: Vanna Scotland, MD;  Location: [facility];  Service: Urology;  Laterality: Left;   EXAMINATION UNDER ANESTHESIA  12/06/2011   Procedure: EXAM UNDER ANESTHESIA;  Surgeon: Romie Levee, MD;  Location: [facility];  Service: General;  Laterality: N/A;  rectal exam under anesthesia, anal dilation, pouchoscopy     SPINE SURGERY  05/12/2022   2 fractured vertebra   TOTAL ABDOMINAL HYSTERECTOMY  05/2006   for abscessed ovaries    Home Medications:  Allergies as of 07/11/2022       Reactions   Ciprofloxacin Rash   Erythema and pruritis around IV site, erythema and rash along vein   Other Rash   Jardiance [empagliflozin]    Increase in Cr    Lisinopril    REACTION: felt bad/ stomach hurt/ weak and tired   Metformin And Related    GI   Sulfa Antibiotics Rash        Medication List        Accurate as of July 11, 2022  2:40 PM. If you have any questions, ask your nurse or doctor.          STOP taking these medications  Mounjaro 5 MG/0.5ML Pen Generic drug: tirzepatide Replaced by: tirzepatide 7.5 MG/0.5ML Pen Stopped by: Roxy Manns, MD       TAKE these medications    Accu-Chek Guide test strip Generic drug: glucose blood TO CHECK GLUCOSE DAILY AND AS NEEDED FOR TYPE 2 DIABETES   Accu-Chek Guide w/Device Kit To check glucose daily and as needed for type 2 diabetes   accu-chek multiclix lancets To check glucose daily and as needed for diabetes type 2   acetaminophen 325 MG tablet Commonly known as: TYLENOL Take 650 mg by mouth every 6 (six) hours as needed for moderate pain.   atorvastatin 10 MG tablet Commonly known as: LIPITOR TAKE 1/2 PILL BY MOUTH EVERY OTHER DAY What changed: See the new instructions.   cephALEXin 500 MG  capsule Commonly known as: Keflex Take 1 capsule (500 mg total) by mouth 2 (two) times daily for 5 days. Started by: Carman Ching, PA-C   esomeprazole 20 MG capsule Commonly known as: NEXIUM Take 20 mg by mouth every other day.   fluconazole 150 MG tablet Commonly known as: DIFLUCAN Take 1 tablet (150 mg total) by mouth once for 1 dose. Started by: Carman Ching, PA-C   fluticasone 50 MCG/ACT nasal spray Commonly known as: FLONASE Place 2 sprays into both nostrils at bedtime.   FreeStyle Libre 3 Sensor Misc Use as directed for diabetes type 2 requiring insulin   losartan 25 MG tablet Commonly known as: COZAAR Take 25 mg by mouth daily.   metoprolol tartrate 25 MG tablet Commonly known as: LOPRESSOR TAKE 1 TABLET BY MOUTH EVERY DAY   multivitamin tablet Take 1 tablet by mouth daily.   NON FORMULARY SLEEPS WITH BI-PAP   simethicone 80 MG chewable tablet Commonly known as: MYLICON Chew 1 tablet (80 mg total) by mouth 4 (four) times daily as needed for flatulence.   tirzepatide 7.5 MG/0.5ML Pen Commonly known as: MOUNJARO Inject 7.5 mg into the skin once a week. Replaces: Mounjaro 5 MG/0.5ML Pen Started by: Roxy Manns, MD   Vitamin D3 25 MCG (1000 UT) Caps Take 1,000 Units by mouth daily.   zolpidem 10 MG tablet Commonly known as: AMBIEN Take 0.5 tablets (5 mg total) by mouth at bedtime as needed for sleep. Caution of sedation and falls        Allergies:  Allergies  Allergen Reactions   Ciprofloxacin Rash    Erythema and pruritis around IV site, erythema and rash along vein   Other Rash   Jardiance [Empagliflozin]     Increase in Cr    Lisinopril     REACTION: felt bad/ stomach hurt/ weak and tired   Metformin And Related     GI   Sulfa Antibiotics Rash    Family History: Family History  Problem Relation Age of Onset   Hypertension Mother    Hyperlipidemia Mother    Cirrhosis Mother        NASH   Diabetes Mother    Liver  cancer Father        resection secondary to mets   Cancer Father        colon   Hyperlipidemia Father    Hypertension Father    Allergies Father    Ulcerative colitis Father    Arthritis Father    Allergies Brother    Gastric cancer Brother    Cancer Brother    Breast cancer Paternal Grandmother    Breast cancer Cousin 35       paternal  cousins   ADD / ADHD Son     Social History:   reports that she has never smoked. She has never used smokeless tobacco. She reports that she does not drink alcohol and does not use drugs.  Physical Exam: BP 124/66   Pulse 97   Ht 5\' 5"  (1.651 m)   Wt 251 lb (113.9 kg)   BMI 41.77 kg/m   Constitutional:  Alert and oriented, no acute distress, nontoxic appearing HEENT: [place], AT Cardiovascular: No clubbing, cyanosis, or edema Respiratory: Normal respiratory effort, no increased work of breathing Skin: No rashes, bruises or suspicious lesions Neurologic: Grossly intact, no focal deficits, moving all 4 extremities Psychiatric: Normal mood and affect  Laboratory Data: Results for orders placed or performed in visit on 07/11/22  Microscopic Examination   Urine  Result Value Ref Range   WBC, UA >30 (A) 0 - 5 /hpf   RBC, Urine 0-2 0 - 2 /hpf   Epithelial Cells (non renal) 0-10 0 - 10 /hpf   Casts Present (A) None seen /lpf   Cast Type Granular casts (A) N/A   Bacteria, UA Many (A) None seen/Few  Urinalysis, Complete  Result Value Ref Range   Specific Gravity, UA 1.015 1.005 - 1.030   pH, UA 5.5 5.0 - 7.5   Color, UA Yellow Yellow   Appearance Ur Cloudy (A) Clear   Leukocytes,UA 2+ (A) Negative   Protein,UA 1+ (A) Negative/Trace   Glucose, UA Negative Negative   Ketones, UA Negative Negative   RBC, UA Trace (A) Negative   Bilirubin, UA Negative Negative   Urobilinogen, Ur 0.2 0.2 - 1.0 mg/dL   Nitrite, UA Negative Negative   Microscopic Examination See below:   Bladder Scan (Post Void Residual) in office  Result Value Ref Range    Scan Result 61    Assessment & Plan:   1. Urinary retention PVR WNL.  Resolved.  Okay to follow-up as needed. - Bladder Scan (Post Void Residual) in office  2. Acute cystitis without hematuria UA appears grossly infected today, will start empiric Keflex and send for culture for further evaluation. - Urinalysis, Complete - CULTURE, URINE COMPREHENSIVE - cephALEXin (KEFLEX) 500 MG capsule; Take 1 capsule (500 mg total) by mouth 2 (two) times daily for 5 days.  Dispense: 10 capsule; Refill: 0  3. Vulvovaginal pruritus Will prescribe empiric Diflucan x 1 dose for possible yeast infection. - fluconazole (DIFLUCAN) 150 MG tablet; Take 1 tablet (150 mg total) by mouth once for 1 dose.  Dispense: 1 tablet; Refill: 0   Return if symptoms worsen or fail to improve.  Carman Ching, PA-C  [facility] [place] [address] [place], [place] [address] [phone]

## 2022-07-11 NOTE — Assessment & Plan Note (Signed)
Disc goals for lipids and reasons to control them Rev last labs with pt Rev low sat fat diet in detail Atorvastatin 5 mg every othe day  Good LDL control at 34 Expect trig to improve more as blood glucose does  

## 2022-07-11 NOTE — Patient Instructions (Addendum)
Keep walking  When you are released to exercise without restriction  Add some strength training to your routine, this is important for bone and brain health and can reduce your risk of falls and help your body use insulin properly and regulate weight  Light weights, exercise bands , and internet videos are a good way to start  Yoga (chair or regular), machines , floor exercises or a gym with machines are also good options     For the next dose of mounjaro go up to 7.5 (put the 5 mg away for now)  If any side effects let us know  In 3-4 weeks update me with  how blood sugar is   Get some vitamin B12 over the counter 1000 mcg daily  Schedule a B12 shot weekly for 3 more weeks   Follow up in 3 months with labs prior for B12 and A1c     You have an order for:  []   2D Mammogram  [x]   3D Mammogram  [x]   Bone Density     Please call for appointment:   [x]   [facility]  [address] [place] [address]  [phone]  []   [facility])   [address]  [place], [place] [address]  [phone]  []   [facility]      [address] [place], [place]        [phone]         []   [facility]  [address] [place], [place]  [phone]  []  [facility]   [address]   [place], [place] [address]  [phone]  []  [facility]  [address] [place], [place] [address] [phone]    Make sure to wear two piece clothing  No lotions powders or deodorants the day of the appointment Make sure to bring picture ID and insurance card.  Bring list of medications you are currently taking including any supplements.   Schedule your screening mammogram through MyChart!   Select [place] imaging sites can now be scheduled  through MyChart.  Log into your MyChart account.  Go to 'Visit' (or 'Appointments' if  on mobile App) --> Schedule an  Appointment  Under 'Select a Reason for Visit' choose the Mammogram  Screening option.  Complete the pre-visit questions  and select the time and place that  best fits your schedule

## 2022-07-11 NOTE — Assessment & Plan Note (Signed)
Low B12  Will supplement   Takes ppi every other day

## 2022-07-11 NOTE — Assessment & Plan Note (Signed)
Dexa ordered  Osteopenia and recent spinal comp fracture

## 2022-07-11 NOTE — Assessment & Plan Note (Signed)
Seeing nephrology  Reviewed GFR 43.1  Follow up planned  Good water intake

## 2022-07-11 NOTE — Assessment & Plan Note (Addendum)
Discussed how this problem influences overall health and the risks it imposes  Reviewed plan for weight loss with lower calorie diet (via better food choices and also portion control or program like weight watchers) and exercise building up to or more than 30 minutes 5 days per week including some aerobic activity   Plan to go up on mounaro to 7.5 mg weekly

## 2022-07-11 NOTE — Assessment & Plan Note (Signed)
bp in fair control at this time (after hospitalization) BP Readings from Last 1 Encounters:  07/11/22 130/74   No changes needed Most recent labs reviewed  Disc lifstyle change with low sodium diet and exercise  Plan to continue  Losartan 25 mg daily  Metoprolol 25 mg daily  Nephrology f/u planned

## 2022-07-13 LAB — CULTURE, URINE COMPREHENSIVE

## 2022-07-14 LAB — CULTURE, URINE COMPREHENSIVE

## 2022-07-18 ENCOUNTER — Encounter: Payer: Self-pay | Admitting: Urology

## 2022-07-18 NOTE — Telephone Encounter (Signed)
Spoke to patient and scheduled an appointment for UTI symptoms.

## 2022-07-19 ENCOUNTER — Ambulatory Visit: Payer: Medicare PPO | Admitting: Physician Assistant

## 2022-07-19 ENCOUNTER — Encounter: Payer: Self-pay | Admitting: Physician Assistant

## 2022-07-19 VITALS — BP 146/71 | HR 89 | Ht 65.0 in | Wt 252.1 lb

## 2022-07-19 DIAGNOSIS — N3000 Acute cystitis without hematuria: Secondary | ICD-10-CM | POA: Diagnosis not present

## 2022-07-19 LAB — URINALYSIS, COMPLETE
Bilirubin, UA: NEGATIVE
Glucose, UA: NEGATIVE
Ketones, UA: NEGATIVE
Nitrite, UA: POSITIVE — AB
Specific Gravity, UA: 1.025 (ref 1.005–1.030)
Urobilinogen, Ur: 0.2 mg/dL (ref 0.2–1.0)
pH, UA: 6 (ref 5.0–7.5)

## 2022-07-19 LAB — MICROSCOPIC EXAMINATION: WBC, UA: >30 /hpf — AB (ref 0–5)

## 2022-07-19 LAB — BLADDER SCAN AMB NON-IMAGING

## 2022-07-19 MED ORDER — CEPHALEXIN 500 MG PO CAPS
500.0000 mg | ORAL_CAPSULE | Freq: Two times a day (BID) | ORAL | 0 refills | Status: AC
Start: 2022-07-19 — End: 2022-07-26

## 2022-07-19 MED ORDER — FLUCONAZOLE 150 MG PO TABS
150.0000 mg | ORAL_TABLET | Freq: Once | ORAL | 0 refills | Status: AC
Start: 2022-07-19 — End: 2022-07-19

## 2022-07-19 NOTE — Progress Notes (Signed)
07/19/2022 4:04 PM   Jamie [name] 1955-04-02 [id number]  CC: Chief Complaint  Patient presents with   Follow-up   Urinary Tract Infection   HPI: Jamie [name] is a 67 y.o. female with a recent history of urinary retention following T11-L3 posterior thoracic fusion with Dr. Myer Haff who subsequently passed a voiding trial with [place] this month but developed UTI thereafter who presents today for evaluation of recurrent versus persistent UTI.   Today she reports she completed Keflex 3 days ago and her symptoms significantly improved, but did not fully resolved.  The following day, she had recurrence of bladder pain, urgency, and frequency.  CTAP without contrast dated 05/19/2022 was notable for stable mild left hydronephrosis consistent with UPJ obstruction and 2 nonobstructing left lower pole renal stones.  In-office UA today positive for 2+ blood, 3+ protein, nitrites, and 2+ leukocytes; urine microscopy with >30 WBCs/HPF, 11-30 RBCs/HPF, and many bacteria. PVR .  PMH: Past Medical History:  Diagnosis Date   Allergy 1970s   Sulfa drugs   Anemia    Arthritis    generalized   Blood transfusion without reported diagnosis 1979   Bowel obstruction (HCC)    repeated (? possible adhesions) neg EGD   Cataract ~2018   Not ready for surgery yet   Chronic kidney disease    Colitis    with colectomy   Diabetes mellitus without complication (HCC)    Dyspnea    covid   Gall stones 01/23/2006   Gastroenteritis 07/25/2014   hosp for IVF    GERD (gastroesophageal reflux disease)    History of kidney stones    HTN (hypertension)    Hyperglycemia    mild-monitors A1C   Obesity    Pneumonia    Rosacea    Sleep apnea    CPAP everynight    Surgical History: Past Surgical History:  Procedure Laterality Date   APPENDECTOMY  08-1991   Along with colon removal   CHOLECYSTECTOMY     COLECTOMY  1993   has ileostomy   COLON SURGERY  2021   removed rectum   COLONOSCOPY   08/1991   COLOSTOMY REVISION  12/06/2011   Procedure: COLOSTOMY REVISION;  Surgeon: Romie Levee, MD;  Location: [facility];  Service: General;  Laterality: Right;  OSTOMY revision   CYSTOSCOPY/URETEROSCOPY/HOLMIUM LASER/STENT PLACEMENT Left 04/11/2021   Procedure: CYSTOSCOPY/URETEROSCOPY/HOLMIUM LASER/STENT PLACEMENT;  Surgeon: Vanna Scotland, MD;  Location: [facility];  Service: Urology;  Laterality: Left;   EXAMINATION UNDER ANESTHESIA  12/06/2011   Procedure: EXAM UNDER ANESTHESIA;  Surgeon: Romie Levee, MD;  Location: [facility];  Service: General;  Laterality: N/A;  rectal exam under anesthesia, anal dilation, pouchoscopy     SPINE SURGERY  05/12/2022   2 fractured vertebra   TOTAL ABDOMINAL HYSTERECTOMY  05/2006   for abscessed ovaries    Home Medications:  Allergies as of 07/19/2022       Reactions   Ciprofloxacin Rash   Erythema and pruritis around IV site, erythema and rash along vein   Other Rash   Jardiance [empagliflozin]    Increase in Cr    Lisinopril    REACTION: felt bad/ stomach hurt/ weak and tired   Metformin And Related    GI   Sulfa Antibiotics Rash        Medication List        Accurate as of July 19, 2022  4:04 PM. If you have any questions, ask your nurse or  doctor.          Accu-Chek Guide test strip Generic drug: glucose blood TO CHECK GLUCOSE DAILY AND AS NEEDED FOR TYPE 2 DIABETES   Accu-Chek Guide w/Device Kit To check glucose daily and as needed for type 2 diabetes   accu-chek multiclix lancets To check glucose daily and as needed for diabetes type 2   acetaminophen 325 MG tablet Commonly known as: TYLENOL Take 650 mg by mouth every 6 (six) hours as needed for moderate pain.   atorvastatin 10 MG tablet Commonly known as: LIPITOR TAKE 1/2 PILL BY MOUTH EVERY OTHER DAY What changed: See the new instructions.   esomeprazole 20 MG capsule Commonly known as: NEXIUM Take 20 mg by mouth every other  day.   fluticasone 50 MCG/ACT nasal spray Commonly known as: FLONASE Place 2 sprays into both nostrils at bedtime.   FreeStyle Libre 3 Sensor Misc Use as directed for diabetes type 2 requiring insulin   losartan 25 MG tablet Commonly known as: COZAAR Take 25 mg by mouth daily.   metoprolol tartrate 25 MG tablet Commonly known as: LOPRESSOR TAKE 1 TABLET BY MOUTH EVERY DAY   multivitamin tablet Take 1 tablet by mouth daily.   NON FORMULARY SLEEPS WITH BI-PAP   simethicone 80 MG chewable tablet Commonly known as: MYLICON Chew 1 tablet (80 mg total) by mouth 4 (four) times daily as needed for flatulence.   tirzepatide 7.5 MG/0.5ML Pen Commonly known as: MOUNJARO Inject 7.5 mg into the skin once a week.   Vitamin D3 25 MCG (1000 UT) Caps Take 1,000 Units by mouth daily.   zolpidem 10 MG tablet Commonly known as: AMBIEN Take 0.5 tablets (5 mg total) by mouth at bedtime as needed for sleep. Caution of sedation and falls        Allergies:  Allergies  Allergen Reactions   Ciprofloxacin Rash    Erythema and pruritis around IV site, erythema and rash along vein   Other Rash   Jardiance [Empagliflozin]     Increase in Cr    Lisinopril     REACTION: felt bad/ stomach hurt/ weak and tired   Metformin And Related     GI   Sulfa Antibiotics Rash    Family History: Family History  Problem Relation Age of Onset   Hypertension Mother    Hyperlipidemia Mother    Cirrhosis Mother        NASH   Diabetes Mother    Liver cancer Father        resection secondary to mets   Cancer Father        colon   Hyperlipidemia Father    Hypertension Father    Allergies Father    Ulcerative colitis Father    Arthritis Father    Allergies Brother    Gastric cancer Brother    Cancer Brother    Breast cancer Paternal Grandmother    Breast cancer Cousin 35       paternal cousins   ADD / ADHD Son     Social History:   reports that she has never smoked. She has never used  smokeless tobacco. She reports that she does not drink alcohol and does not use drugs.  Physical Exam: BP (!) 146/71 (BP Location: Left Wrist, Patient Position: Sitting, Cuff Size: Normal)   Pulse 89   Ht 5\' 5"  (1.651 m)   Wt 252 lb 1 oz (114.3 kg)   BMI 41.95 kg/m   Constitutional:  Alert and  oriented, no acute distress, nontoxic appearing HEENT: [place], AT Cardiovascular: No clubbing, cyanosis, or edema Respiratory: Normal respiratory effort, no increased work of breathing Skin: No rashes, bruises or suspicious lesions Neurologic: Grossly intact, no focal deficits, moving all 4 extremities Psychiatric: Normal mood and affect  Laboratory Data: Results for orders placed or performed in visit on 07/19/22  Microscopic Examination   Urine  Result Value Ref Range   WBC, UA >30 (A) 0 - 5 /hpf   RBC, Urine 11-30 (A) 0 - 2 /hpf   Epithelial Cells (non renal) 0-10 0 - 10 /hpf   Bacteria, UA Many (A) None seen/Few  Urinalysis, Complete  Result Value Ref Range   Specific Gravity, UA 1.025 1.005 - 1.030   pH, UA 6.0 5.0 - 7.5   Color, UA Yellow Yellow   Appearance Ur Cloudy (A) Clear   Leukocytes,UA 2+ (A) Negative   Protein,UA 3+ (A) Negative/Trace   Glucose, UA Negative Negative   Ketones, UA Negative Negative   RBC, UA 2+ (A) Negative   Bilirubin, UA Negative Negative   Urobilinogen, Ur 0.2 0.2 - 1.0 mg/dL   Nitrite, UA Positive (A) Negative   Microscopic Examination See below:   BLADDER SCAN AMB NON-IMAGING  Result Value Ref Range   Scan Result    Assessment & Plan:   1. Acute cystitis without hematuria UA today remains grossly infected appearing, will resume Keflex, this time for 7 days, and repeat a urine culture.  Also sending in 1 dose of Diflucan per patient request.  Will plan for lab visit for UA in 1 week.  If she has persistent microscopic hematuria at that time, will obtain CT stone study to rule out ureteral stone. - Urinalysis, Complete - CULTURE, URINE  COMPREHENSIVE - BLADDER SCAN AMB NON-IMAGING - cephALEXin (KEFLEX) 500 MG capsule; Take 1 capsule (500 mg total) by mouth 2 (two) times daily for 7 days.  Dispense: 14 capsule; Refill: 0 - fluconazole (DIFLUCAN) 150 MG tablet; Take 1 tablet (150 mg total) by mouth once for 1 dose.  Dispense: 1 tablet; Refill: 0   Return in about 1 week (around 07/26/2022) for Lab visit for UA.  Carman Ching, PA-C  [facility] [place] [address] [place], [place] [address] [phone]

## 2022-07-20 ENCOUNTER — Ambulatory Visit: Payer: Medicare PPO | Admitting: Family Medicine

## 2022-07-20 ENCOUNTER — Ambulatory Visit: Payer: Medicare PPO

## 2022-07-20 VITALS — BP 122/78 | HR 82 | Temp 97.7°F | Ht 65.0 in | Wt 252.1 lb

## 2022-07-20 DIAGNOSIS — I10 Essential (primary) hypertension: Secondary | ICD-10-CM

## 2022-07-20 DIAGNOSIS — N3001 Acute cystitis with hematuria: Secondary | ICD-10-CM | POA: Diagnosis not present

## 2022-07-20 DIAGNOSIS — F410 Panic disorder [episodic paroxysmal anxiety] without agoraphobia: Secondary | ICD-10-CM

## 2022-07-20 DIAGNOSIS — S32009K Unspecified fracture of unspecified lumbar vertebra, subsequent encounter for fracture with nonunion: Secondary | ICD-10-CM

## 2022-07-20 DIAGNOSIS — E538 Deficiency of other specified B group vitamins: Secondary | ICD-10-CM | POA: Diagnosis not present

## 2022-07-20 MED ORDER — FLUOXETINE HCL 10 MG PO CAPS
10.0000 mg | ORAL_CAPSULE | Freq: Every day | ORAL | 1 refills | Status: DC
Start: 2022-07-20 — End: 2022-08-11

## 2022-07-20 MED ORDER — CYANOCOBALAMIN 1000 MCG/ML IJ SOLN
1000.0000 ug | Freq: Once | INTRAMUSCULAR | Status: AC
Start: 2022-07-20 — End: 2022-07-20
  Administered 2022-07-20: 1000 ug via INTRAMUSCULAR

## 2022-07-20 NOTE — Progress Notes (Signed)
Subjective:    Patient ID: Jamie [name], female    DOB: 09-07-1955, 67 y.o.   MRN: [id number]  HPI  Wt Readings from Last 3 Encounters:  07/20/22 252 lb 2 oz (114.4 kg)  07/19/22 252 lb 1 oz (114.3 kg)  07/11/22 251 lb (113.9 kg)   41.96 kg/m  Vitals:   07/20/22 1059  BP: 122/78  Pulse: 82  Temp: 97.7 F (36.5 C)  SpO2: 98%   Pt presents for anxious mood  Also B12 shot   On June 4th had catheter removed at urology- was struggling and had uti (was treated) Then became very anxious that night  Up and about all night  Feels panicky like she needs to get outside  Makes her chest feel funny  Tries to distract her self with reading/ movie  Also crochet- hard to stay focused on  Riding in car helps  Feels the need to get to a different place Claustrophobia   Worse in afternoon and evening  Am is the best   Was out all day yesterday and felt ok  The busier she is the better   No etoh No food  Appetite is down in general    Back is feeling better  Much more mobile  Uses cane prn    Another uti yesterday at urology  On antibiotic again - keflex   A lot of stress lately- health problems   In the past not a generally anxious person but brain would get active at night  Has needed ambien for sleep in the past          07/11/2022   11:00 AM 05/30/2022   10:43 AM 04/07/2022   12:28 PM 02/06/2022    8:10 AM 05/31/2021   10:35 AM  Depression screen PHQ 2/9  Decreased Interest 0 0 0 0 0  Down, Depressed, Hopeless 1 0 0 0 0  PHQ - 2 Score 1 0 0 0 0  Altered sleeping 2 1 0 0   Tired, decreased energy 0 0 0 0   Change in appetite 1 0 0 0   Feeling bad or failure about yourself  0 0 0 0   Trouble concentrating 1 1 0 0   Moving slowly or fidgety/restless 0 0 0 0   Suicidal thoughts 0 0 0 0   PHQ-9 Score 5 2 0 0   Difficult doing work/chores Not difficult at all Not difficult at all Not difficult at all Not difficult at all       07/11/2022   11:01 AM  05/30/2022   10:43 AM 02/06/2022    8:10 AM  GAD 7 : Generalized Anxiety Score  Nervous, Anxious, on Edge 1 0 0  Control/stop worrying 0 0 0  Worry too much - different things 0 0 0  Trouble relaxing 0 0 0  Restless 0 0 0  Easily annoyed or irritable 0 0 0  Afraid - awful might happen 0 0 0  Total GAD 7 Score 1 0 0  Anxiety Difficulty Not difficult at all Not difficult at all Not difficult at all       B12 def Lab Results  Component Value Date   VITAMINB12 121 (L) 07/04/2022   Planned 4 weekly shots Also 1000 mcg oral every day over the counter   Then re check at 3 mo    Patient Active Problem List   Diagnosis Date Noted   Panic anxiety syndrome 07/20/2022   Encounter  for screening mammogram for breast cancer 07/11/2022   Estrogen deficiency 07/11/2022   Low serum vitamin B12 07/04/2022   Current use of proton pump inhibitor 07/03/2022   Low magnesium level 06/06/2022   Closed tricolumnar fracture of lumbar vertebra (HCC) 05/12/2022   Compression fracture of L1 lumbar vertebra (HCC) 05/11/2022   Morbid obesity (HCC) 05/11/2022   Fall at home, initial encounter 05/11/2022   Chronic kidney disease, stage 3a (HCC) 05/11/2022   Closed fracture of first lumbar vertebra (HCC) 05/11/2022   Closed fracture of twelfth thoracic vertebra (HCC) 05/11/2022   Spinal instability, lumbar 05/11/2022   Hearing loss, right 04/07/2022   Sinus congestion 04/07/2022   Abnormal SPEP 04/28/2021   Abnormal EKG 04/26/2021   CKD (chronic kidney disease) stage 3, GFR 30-59 ml/min (HCC) 05/31/2020   Aortic atherosclerosis (HCC) 03/01/2020   Elevated serum creatinine 05/14/2019   Dermatitis 07/01/2018   Left shoulder pain 11/30/2017   Palpitations 07/05/2017   Lichen planus 04/09/2017   Hyperlipidemia associated with type 2 diabetes mellitus (HCC) 04/10/2016   UTI (urinary tract infection) 10/02/2014   Red blood cell antibody positive 07/02/2013   Elevated C-reactive protein (CRP)  05/15/2012   Routine general medical examination at a health care facility 09/24/2011   Apnea 11/14/2010   Stress reaction, emotional 05/02/2010   Compulsive overeater 05/02/2010   Arthritis    H/O small bowel obstruction    KNEE PAIN 10/14/2009   DM (diabetes mellitus), type 2 with renal complications (HCC) 04/23/2009   ROSACEA 09/12/2007   Essential hypertension 05/15/2007   OSA (obstructive sleep apnea) 12/13/2006   Ulcerative colitis (HCC) 07/12/2006   Past Medical History:  Diagnosis Date   Allergy 1970s   Sulfa drugs   Anemia    Arthritis    generalized   Blood transfusion without reported diagnosis 1979   Bowel obstruction (HCC)    repeated (? possible adhesions) neg EGD   Cataract ~2018   Not ready for surgery yet   Chronic kidney disease    Colitis    with colectomy   Diabetes mellitus without complication (HCC)    Dyspnea    covid   Gall stones 01/23/2006   Gastroenteritis 07/25/2014   hosp for IVF    GERD (gastroesophageal reflux disease)    History of kidney stones    HTN (hypertension)    Hyperglycemia    mild-monitors A1C   Obesity    Pneumonia    Rosacea    Sleep apnea    CPAP everynight   Past Surgical History:  Procedure Laterality Date   APPENDECTOMY  08-1991   Along with colon removal   CHOLECYSTECTOMY     COLECTOMY  1993   has ileostomy   COLON SURGERY  2021   removed rectum   COLONOSCOPY  08/1991   COLOSTOMY REVISION  12/06/2011   Procedure: COLOSTOMY REVISION;  Surgeon: Romie Levee, MD;  Location: [facility];  Service: General;  Laterality: Right;  OSTOMY revision   CYSTOSCOPY/URETEROSCOPY/HOLMIUM LASER/STENT PLACEMENT Left 04/11/2021   Procedure: CYSTOSCOPY/URETEROSCOPY/HOLMIUM LASER/STENT PLACEMENT;  Surgeon: Vanna Scotland, MD;  Location: [facility];  Service: Urology;  Laterality: Left;   EXAMINATION UNDER ANESTHESIA  12/06/2011   Procedure: EXAM UNDER ANESTHESIA;  Surgeon: Romie Levee, MD;  Location: [facility];  Service: General;  Laterality: N/A;  rectal exam under anesthesia, anal dilation, pouchoscopy     SPINE SURGERY  05/12/2022   2 fractured vertebra   TOTAL ABDOMINAL HYSTERECTOMY  05/2006  for abscessed ovaries   Social History   Tobacco Use   Smoking status: Never   Smokeless tobacco: Never  Vaping Use   Vaping Use: Never used  Substance Use Topics   Alcohol use: No   Drug use: Never   Family History  Problem Relation Age of Onset   Hypertension Mother    Hyperlipidemia Mother    Cirrhosis Mother        NASH   Diabetes Mother    Liver cancer Father        resection secondary to mets   Cancer Father        colon   Hyperlipidemia Father    Hypertension Father    Allergies Father    Ulcerative colitis Father    Arthritis Father    Allergies Brother    Gastric cancer Brother    Cancer Brother    Breast cancer Paternal Grandmother    Breast cancer Cousin 5       paternal cousins   ADD / ADHD Son    Allergies  Allergen Reactions   Ciprofloxacin Rash    Erythema and pruritis around IV site, erythema and rash along vein   Other Rash   Jardiance [Empagliflozin]     Increase in Cr    Lisinopril     REACTION: felt bad/ stomach hurt/ weak and tired   Metformin And Related     GI   Sulfa Antibiotics Rash   Current Outpatient Medications on File Prior to Visit  Medication Sig Dispense Refill   ACCU-CHEK GUIDE test strip TO CHECK GLUCOSE DAILY AND AS NEEDED FOR TYPE 2 DIABETES 100 strip 1   acetaminophen (TYLENOL) 325 MG tablet Take 650 mg by mouth every 6 (six) hours as needed for moderate pain.     atorvastatin (LIPITOR) 10 MG tablet TAKE 1/2 PILL BY MOUTH EVERY OTHER DAY (Patient taking differently: Take 5 mg by mouth every other day.) 24 tablet 1   Blood Glucose Monitoring Suppl (ACCU-CHEK GUIDE) w/Device KIT To check glucose daily and as needed for type 2 diabetes 1 kit 0   cephALEXin (KEFLEX) 500 MG capsule Take 1 capsule (500 mg total) by mouth 2  (two) times daily for 7 days. 14 capsule 0   Cholecalciferol (VITAMIN D3) 25 MCG (1000 UT) CAPS Take 1,000 Units by mouth daily.     Continuous Glucose Sensor (FREESTYLE LIBRE 3 SENSOR) MISC Use as directed for diabetes type 2 requiring insulin 6 each 3   Cyanocobalamin (B-12) 1000 MCG TABS Take 1 tablet by mouth daily.     Cyanocobalamin 1000 MCG/ML KIT Inject 1 mL as directed once a week. X4 weeks     esomeprazole (NEXIUM) 20 MG capsule Take 20 mg by mouth every other day.     fluticasone (FLONASE) 50 MCG/ACT nasal spray Place 2 sprays into both nostrils at bedtime.     Lancets (ACCU-CHEK MULTICLIX) lancets To check glucose daily and as needed for diabetes type 2 100 each 3   losartan (COZAAR) 25 MG tablet Take 25 mg by mouth daily.     metoprolol tartrate (LOPRESSOR) 25 MG tablet TAKE 1 TABLET BY MOUTH EVERY DAY 90 tablet 0   Multiple Vitamin (MULTIVITAMIN) tablet Take 1 tablet by mouth daily.     NON FORMULARY SLEEPS WITH BI-PAP     simethicone (MYLICON) 80 MG chewable tablet Chew 1 tablet (80 mg total) by mouth 4 (four) times daily as needed for flatulence. 30 tablet 0   tirzepatide (  MOUNJARO) 7.5 MG/0.5ML Pen Inject 7.5 mg into the skin once a week. 2 mL 0   zolpidem (AMBIEN) 10 MG tablet Take 0.5 tablets (5 mg total) by mouth at bedtime as needed for sleep. Caution of sedation and falls 15 tablet 3   No current facility-administered medications on file prior to visit.    Review of Systems  Constitutional:  Positive for fatigue. Negative for activity change, appetite change, fever and unexpected weight change.  HENT:  Negative for congestion, ear pain, rhinorrhea, sinus pressure and sore throat.   Eyes:  Negative for pain, redness and visual disturbance.  Respiratory:  Negative for cough, shortness of breath and wheezing.   Cardiovascular:  Negative for chest pain and palpitations.  Gastrointestinal:  Negative for abdominal pain, blood in stool, constipation and diarrhea.  Endocrine:  Negative for polydipsia and polyuria.  Genitourinary:  Negative for dysuria, frequency and urgency.  Musculoskeletal:  Negative for arthralgias, back pain and myalgias.  Skin:  Negative for pallor and rash.  Allergic/Immunologic: Negative for environmental allergies.  Neurological:  Negative for dizziness, syncope and headaches.  Hematological:  Negative for adenopathy. Does not bruise/bleed easily.  Psychiatric/Behavioral:  Positive for decreased concentration and sleep disturbance. Negative for agitation, behavioral problems, confusion, dysphoric mood, hallucinations, self-injury and suicidal ideas. The patient is not nervous/anxious.        Objective:   Physical Exam Constitutional:      General: She is not in acute distress.    Appearance: Normal appearance. She is well-developed. She is obese. She is not ill-appearing or diaphoretic.  HENT:     Head: Normocephalic and atraumatic.  Eyes:     General: No scleral icterus.    Conjunctiva/sclera: Conjunctivae normal.     Pupils: Pupils are equal, round, and reactive to light.  Neck:     Thyroid: No thyromegaly.     Vascular: No carotid bruit or JVD.  Cardiovascular:     Rate and Rhythm: Normal rate and regular rhythm.     Heart sounds: Normal heart sounds.     No gallop.  Pulmonary:     Effort: Pulmonary effort is normal. No respiratory distress.     Breath sounds: Normal breath sounds. No wheezing or rales.  Abdominal:     General: There is no distension or abdominal bruit.     Palpations: Abdomen is soft.  Musculoskeletal:     Cervical back: Normal range of motion and neck supple.     Right lower leg: No edema.     Left lower leg: No edema.  Lymphadenopathy:     Cervical: No cervical adenopathy.  Skin:    General: Skin is warm and dry.     Coloration: Skin is not pale.     Findings: No rash.  Neurological:     Mental Status: She is alert.     Coordination: Coordination normal.     Deep Tendon Reflexes: Reflexes are  normal and symmetric. Reflexes normal.  Psychiatric:        Attention and Perception: Attention normal.        Mood and Affect: Mood is anxious. Affect is not flat or tearful.        Speech: Speech normal.        Behavior: Behavior normal.        Thought Content: Thought content normal.        Cognition and Memory: Cognition and memory normal.     Comments: Candidly discusses symptoms and stressors  Assessment & Plan:   Problem List Items Addressed This Visit       Cardiovascular and Mediastinum   Essential hypertension    bp in fair control at this time (after hospitalization) BP Readings from Last 1 Encounters:  07/20/22 122/78  No changes needed Most recent labs reviewed  Disc lifstyle change with low sodium diet and exercise  Plan to continue  Losartan 25 mg daily  Metoprolol 25 mg daily  Nephrology follow up utd         Musculoskeletal and Integument   Closed tricolumnar fracture of lumbar vertebra (HCC)    Making good progress Less pain  Using  cane when needed          Genitourinary   UTI (urinary tract infection)    Treatef for another uti (yesterday) by urology with keflex  This has been recurrent since episode of urinary retention and foley  Reviewed urology notes  Culture pending         Other   Panic anxiety syndrome - Primary    In setting of recent severe medical issues (improving now) pt has noticed a free floating feeling of panic with need to escape her environment and some degree of claustrophobia  Good insight Reviewed stressors/ coping techniques/symptoms/ support sources/ tx options and side effects in detail today  Discussed use of distraction techniques including reading and socializing and projects Discussed usefullness of a meditation practice and resources given for this  Medication options reviewed - I feel she may benefit from short trial of ssri  Prescription fluoxetine 10 mg daily  Discussed expectations of SSRI  medication including time to effectiveness and mechanism of action, also poss of side effects (early and late)- including mental fuzziness, weight or appetite change, nausea and poss of worse dep or anxiety (even suicidal thoughts)  Pt voiced understanding and will stop med and update if this occurs   Will follow up late summer as planned/ earlier if needed Encouraged good self care   27  Minutes were spent today both face to face and in the chart obtaining history, reviewing records and test results, , educating and discussing treatment options, and placing orders  Meds ordered this encounter  Medications   FLUoxetine (PROZAC) 10 MG capsule    Sig: Take 1 capsule (10 mg total) by mouth daily.    Dispense:  30 capsule    Refill:  1   cyanocobalamin (VITAMIN B12) injection 1,000 mcg            Relevant Medications   FLUoxetine (PROZAC) 10 MG capsule   Low serum vitamin B12    B12 shot today  Taking 1000 mcg daily   Labs planned at 3 mo mark

## 2022-07-20 NOTE — Patient Instructions (Addendum)
If you want to learn about meditation  Calm  Headspace  Apps for phone to check out   Start fluoxetine 10 mg once daily  If any intolerable side effects or if you feel worse -hold it and let us know   Keep doing things you enjoy  Stay active/ brain and body  Stay social   If you need to come back before September please come in earlier

## 2022-07-20 NOTE — Assessment & Plan Note (Signed)
Treatef for another uti (yesterday) by urology with keflex  This has been recurrent since episode of urinary retention and foley  Reviewed urology notes  Culture pending

## 2022-07-20 NOTE — Assessment & Plan Note (Signed)
bp in fair control at this time (after hospitalization) BP Readings from Last 1 Encounters:  07/20/22 122/78   No changes needed Most recent labs reviewed  Disc lifstyle change with low sodium diet and exercise  Plan to continue  Losartan 25 mg daily  Metoprolol 25 mg daily  Nephrology follow up utd

## 2022-07-20 NOTE — Assessment & Plan Note (Signed)
In setting of recent severe medical issues (improving now) pt has noticed a free floating feeling of panic with need to escape her environment and some degree of claustrophobia  Good insight Reviewed stressors/ coping techniques/symptoms/ support sources/ tx options and side effects in detail today  Discussed use of distraction techniques including reading and socializing and projects Discussed usefullness of a meditation practice and resources given for this  Medication options reviewed - I feel she may benefit from short trial of ssri  Prescription fluoxetine 10 mg daily  Discussed expectations of SSRI medication including time to effectiveness and mechanism of action, also poss of side effects (early and late)- including mental fuzziness, weight or appetite change, nausea and poss of worse dep or anxiety (even suicidal thoughts)  Pt voiced understanding and will stop med and update if this occurs   Will follow up late summer as planned/ earlier if needed Encouraged good self care   27  Minutes were spent today both face to face and in the chart obtaining history, reviewing records and test results, , educating and discussing treatment options, and placing orders  Meds ordered this encounter  Medications   FLUoxetine (PROZAC) 10 MG capsule    Sig: Take 1 capsule (10 mg total) by mouth daily.    Dispense:  30 capsule    Refill:  1   cyanocobalamin (VITAMIN B12) injection 1,000 mcg

## 2022-07-20 NOTE — Assessment & Plan Note (Signed)
B12 shot today  Taking 1000 mcg daily   Labs planned at 3 mo mark

## 2022-07-20 NOTE — Assessment & Plan Note (Signed)
Making good progress Less pain  Using  cane when needed

## 2022-07-23 LAB — CULTURE, URINE COMPREHENSIVE

## 2022-07-24 ENCOUNTER — Other Ambulatory Visit: Payer: Self-pay | Admitting: *Deleted

## 2022-07-24 DIAGNOSIS — R310 Gross hematuria: Secondary | ICD-10-CM

## 2022-07-25 ENCOUNTER — Ambulatory Visit (INDEPENDENT_AMBULATORY_CARE_PROVIDER_SITE_OTHER): Payer: Medicare PPO

## 2022-07-25 DIAGNOSIS — E538 Deficiency of other specified B group vitamins: Secondary | ICD-10-CM | POA: Diagnosis not present

## 2022-07-25 LAB — CULTURE, URINE COMPREHENSIVE

## 2022-07-25 MED ORDER — CYANOCOBALAMIN 1000 MCG/ML IJ SOLN
1000.0000 ug | Freq: Once | INTRAMUSCULAR | Status: AC
Start: 2022-07-25 — End: 2022-07-25
  Administered 2022-07-25: 1000 ug via INTRAMUSCULAR

## 2022-07-25 NOTE — Progress Notes (Signed)
Per orders of Dr. Roxy Manns, injection of vitamin b 12 given by Lewanda Rife in right deltoid. Patient tolerated injection well. Patient will make appointment for 1 week.

## 2022-07-26 ENCOUNTER — Other Ambulatory Visit: Payer: Self-pay | Admitting: Urology

## 2022-07-26 ENCOUNTER — Other Ambulatory Visit: Payer: Medicare PPO

## 2022-07-26 DIAGNOSIS — R310 Gross hematuria: Secondary | ICD-10-CM | POA: Diagnosis not present

## 2022-07-26 DIAGNOSIS — H903 Sensorineural hearing loss, bilateral: Secondary | ICD-10-CM | POA: Diagnosis not present

## 2022-07-26 DIAGNOSIS — H6983 Other specified disorders of Eustachian tube, bilateral: Secondary | ICD-10-CM | POA: Diagnosis not present

## 2022-07-26 DIAGNOSIS — N3000 Acute cystitis without hematuria: Secondary | ICD-10-CM

## 2022-07-26 LAB — MICROSCOPIC EXAMINATION

## 2022-07-26 LAB — URINALYSIS, COMPLETE
Bilirubin, UA: NEGATIVE
Glucose, UA: NEGATIVE
Ketones, UA: NEGATIVE
Nitrite, UA: NEGATIVE
Protein,UA: NEGATIVE
RBC, UA: NEGATIVE
Specific Gravity, UA: 1.01 (ref 1.005–1.030)
Urobilinogen, Ur: 0.2 mg/dL (ref 0.2–1.0)
pH, UA: 5.5 (ref 5.0–7.5)

## 2022-07-28 ENCOUNTER — Telehealth: Payer: Self-pay | Admitting: *Deleted

## 2022-07-28 MED ORDER — CEPHALEXIN 500 MG PO CAPS: 500.0000 mg | ORAL_CAPSULE | Freq: Two times a day (BID) | ORAL | 0 refills | Status: AC

## 2022-07-28 NOTE — Telephone Encounter (Signed)
Spoke with patient and advised results rx sent to pharmacy by e-script  

## 2022-07-28 NOTE — Telephone Encounter (Signed)
Pt calling triage asking for abx due to symptoms of frequency. Pt dropped off urine 07/26/22 and stated she's having full symptoms. I explained that we need to wait for the culture results. Pt was already on keflex for 7 days when she left the UA and now she's asking for more. Please advise

## 2022-07-31 ENCOUNTER — Other Ambulatory Visit: Payer: Self-pay

## 2022-07-31 DIAGNOSIS — M4325 Fusion of spine, thoracolumbar region: Secondary | ICD-10-CM

## 2022-08-01 ENCOUNTER — Other Ambulatory Visit: Payer: Self-pay | Admitting: Physician Assistant

## 2022-08-01 ENCOUNTER — Telehealth: Payer: Self-pay | Admitting: Family Medicine

## 2022-08-01 ENCOUNTER — Ambulatory Visit (INDEPENDENT_AMBULATORY_CARE_PROVIDER_SITE_OTHER): Payer: Medicare PPO | Admitting: Neurosurgery

## 2022-08-01 ENCOUNTER — Ambulatory Visit
Admission: RE | Admit: 2022-08-01 | Discharge: 2022-08-01 | Disposition: A | Discharge status: Home / Self Care | Payer: Medicare PPO | Source: Ambulatory Visit | Attending: Neurosurgery | Admitting: Neurosurgery

## 2022-08-01 ENCOUNTER — Encounter: Payer: Self-pay | Admitting: Neurosurgery

## 2022-08-01 ENCOUNTER — Ambulatory Visit
Admission: RE | Admit: 2022-08-01 | Discharge: 2022-08-01 | Disposition: A | Discharge status: Home / Self Care | Payer: Medicare PPO | Attending: Neurosurgery | Admitting: Neurosurgery

## 2022-08-01 VITALS — BP 110/68 | Ht 65.0 in | Wt 252.2 lb

## 2022-08-01 DIAGNOSIS — M4856XD Collapsed vertebra, not elsewhere classified, lumbar region, subsequent encounter for fracture with routine healing: Secondary | ICD-10-CM | POA: Diagnosis not present

## 2022-08-01 DIAGNOSIS — S32018D Other fracture of first lumbar vertebra, subsequent encounter for fracture with routine healing: Secondary | ICD-10-CM

## 2022-08-01 DIAGNOSIS — M4325 Fusion of spine, thoracolumbar region: Secondary | ICD-10-CM

## 2022-08-01 DIAGNOSIS — N200 Calculus of kidney: Secondary | ICD-10-CM

## 2022-08-01 DIAGNOSIS — S22088D Other fracture of T11-T12 vertebra, subsequent encounter for fracture with routine healing: Secondary | ICD-10-CM

## 2022-08-01 DIAGNOSIS — M532X6 Spinal instabilities, lumbar region: Secondary | ICD-10-CM

## 2022-08-01 DIAGNOSIS — W109XXD Fall (on) (from) unspecified stairs and steps, subsequent encounter: Secondary | ICD-10-CM

## 2022-08-01 DIAGNOSIS — I700 Atherosclerosis of aorta: Secondary | ICD-10-CM | POA: Diagnosis not present

## 2022-08-01 DIAGNOSIS — M47816 Spondylosis without myelopathy or radiculopathy, lumbar region: Secondary | ICD-10-CM | POA: Diagnosis not present

## 2022-08-01 DIAGNOSIS — S22089S Unspecified fracture of T11-T12 vertebra, sequela: Secondary | ICD-10-CM

## 2022-08-01 LAB — CULTURE, URINE COMPREHENSIVE

## 2022-08-01 NOTE — Telephone Encounter (Signed)
Patient called triage line, states she is still having urinary symptoms. She has finished her last ABX and is having urinary frequency and urgency. Appointment made.

## 2022-08-01 NOTE — Progress Notes (Signed)
   REFERRING PHYSICIAN:  Judy Pimple, Md [address] [place],  [place] [address]  DOS: 05/12/22 T11-L3 posterior instrumentation with ORIF of T12 and L1 fractures  HISTORY OF PRESENT ILLNESS: 08/01/22 Jamie [name] is a 67 year old presenting today for 60-month follow-up of thoracolumbar fusion.  Overall she is doing very well.  She denies any significant back pain.  She does have a catching sensation in her right hip from time to time but feels this is unrelated to her thoracolumbar surgery.   06/22/22  Jamie [name] is status post T11-L3 posterior instrumentation with ORIF T12 and L1 fractures.   She is doing well.  She still has a Foley catheter.  She will be evaluated in urology next week.    PHYSICAL EXAMINATION:  General: Patient is well developed, well nourished, calm, collected, and in no apparent distress.   NEUROLOGICAL:  General: In no acute distress.   Awake, alert, oriented to person, place, and time.  Pupils equal round and reactive to light.  Facial tone is symmetric.     Strength:          Side Iliopsoas Quads Hamstring PF DF EHL  R 5 5 5 5 5 5   L 5 5 5 5 5 5    Incisions are well-healed  ROS (Neurologic):  Negative except as noted above  IMAGING: 08/01/22 lumbar xrays Without evidence of hardware malfunction  ASSESSMENT/PLAN:  Jamie [name] is doing well s/p thoracolumbar fusion.  She has done well with minimal complaints.  Should she continue to have hip pain it may be worthwhile to get hip x-rays however she states it is infrequent at this time.  We will see her back in 6 months with lumbar x-rays prior.  She was encouraged to call the office in the interim with any questions or concerns.  I spent a total of 15 minutes in both face-to-face and non-face-to-face activities for this visit on the date of this encounter.   Manning Charity PA-C Department of neurosurgery

## 2022-08-01 NOTE — Progress Notes (Unsigned)
She has been added on to my schedule at the end of the day tomorrow for recurrent versus persistent UTI.  Given Proteus on recent culture, I would like her to get a stat CT stone study tomorrow before seeing me so we can rule out underlying stone nidus.  I have placed the order for CT today, can you please get this scheduled and notify the patient?

## 2022-08-02 ENCOUNTER — Ambulatory Visit: Payer: Medicare PPO | Admitting: Physician Assistant

## 2022-08-02 ENCOUNTER — Other Ambulatory Visit: Payer: Self-pay | Admitting: Family Medicine

## 2022-08-02 ENCOUNTER — Encounter: Payer: Self-pay | Admitting: Physician Assistant

## 2022-08-02 ENCOUNTER — Ambulatory Visit
Admission: RE | Admit: 2022-08-02 | Discharge: 2022-08-02 | Disposition: A | Discharge status: Home / Self Care | Payer: Medicare PPO | Source: Ambulatory Visit | Attending: Physician Assistant | Admitting: Physician Assistant

## 2022-08-02 VITALS — BP 148/77 | HR 74 | Wt 250.0 lb

## 2022-08-02 DIAGNOSIS — N200 Calculus of kidney: Secondary | ICD-10-CM | POA: Insufficient documentation

## 2022-08-02 DIAGNOSIS — K7460 Unspecified cirrhosis of liver: Secondary | ICD-10-CM | POA: Diagnosis not present

## 2022-08-02 DIAGNOSIS — N390 Urinary tract infection, site not specified: Secondary | ICD-10-CM

## 2022-08-02 DIAGNOSIS — R109 Unspecified abdominal pain: Secondary | ICD-10-CM | POA: Diagnosis not present

## 2022-08-02 DIAGNOSIS — N3289 Other specified disorders of bladder: Secondary | ICD-10-CM | POA: Diagnosis not present

## 2022-08-02 DIAGNOSIS — R350 Frequency of micturition: Secondary | ICD-10-CM | POA: Diagnosis not present

## 2022-08-02 LAB — URINALYSIS, COMPLETE
Bilirubin, UA: NEGATIVE
Ketones, UA: NEGATIVE
Nitrite, UA: POSITIVE — AB
Specific Gravity, UA: <1.005 — ABNORMAL LOW (ref 1.005–1.030)
Urobilinogen, Ur: 1 mg/dL (ref 0.2–1.0)
pH, UA: 5 (ref 5.0–7.5)

## 2022-08-02 LAB — MICROSCOPIC EXAMINATION: WBC, UA: >30 /hpf — AB (ref 0–5)

## 2022-08-02 LAB — BLADDER SCAN AMB NON-IMAGING

## 2022-08-02 MED ORDER — CEFTRIAXONE SODIUM 1 G IJ SOLR
1.0000 g | Freq: Once | INTRAMUSCULAR | Status: AC
Start: 2022-08-02 — End: 2022-08-02
  Administered 2022-08-02: 1 g via INTRAMUSCULAR

## 2022-08-02 MED ORDER — PHENAZOPYRIDINE HCL 200 MG PO TABS
200.0000 mg | ORAL_TABLET | Freq: Three times a day (TID) | ORAL | 0 refills | Status: AC | PRN
Start: 2022-08-02 — End: ?

## 2022-08-02 MED ORDER — CEFDINIR 300 MG PO CAPS
300.0000 mg | ORAL_CAPSULE | Freq: Two times a day (BID) | ORAL | 0 refills | Status: AC
Start: 2022-08-02 — End: 2022-08-11

## 2022-08-02 NOTE — Progress Notes (Unsigned)
08/02/2022 5:00 PM   Jamie [name] 07-26-1955 [id number]  CC: Chief Complaint  Patient presents with   Urinary Frequency   HPI: Jamie [name] is a 67 y.o. female with a recent history of urinary retention following thoracic spinal fusion who subsequently passed a voiding trial but has had recurrent versus persistent UTIs since who presents today for evaluation of recurrent versus persistent UTI.  I saw her in clinic most recently 14 days ago with recurrent bladder pain, urgency, and frequency.  UA appeared grossly infected and I treated her with Keflex 500 mg twice daily x 7 days.  Urine culture grew ampicillin, nitrofurantoin, and tetracycline resistant Klebsiella pneumoniae.  We repeated a UA 1 week later to prove resolution of microscopic hematuria, at which point it had resolved.  Her UA still had pyuria and bacteriuria, but this time grew only tiny amounts of Proteus and Enterobacter.  She had an extended Keflex an additional 3 days for total of 10 days of therapy.  Today she reports that her dysuria, urgency, and frequency recurred 1-1/2 days after completing Keflex.  She has had rather severe dysuria and has been very uncomfortable.  She has been taking Azo, which is relieving her symptoms.  CT stone study today notable only for circumferential urinary bladder wall thickening with perivesical stranding consistent with cystitis.  No evidence of acute stone episode or hydronephrosis.  She is s/p total proctocolectomy with ileostomy in place but denies fecaluria or pneumaturia.  She underwent cystoscopy with Dr. Apolinar Junes on 06/27/2022 with no significant findings.  In-office UA today positive for 1+ glucose, trace lysed blood, 1+ protein, nitrites, and 1+ leukocytes; urine microscopy with >30 WBCs/HPF, 3-10 RBCs/HPF, and many bacteria. PVR .  PMH: Past Medical History:  Diagnosis Date   Allergy 1970s   Sulfa drugs   Anemia    Arthritis    generalized   Blood transfusion  without reported diagnosis 1979   Bowel obstruction (HCC)    repeated (? possible adhesions) neg EGD   Cataract ~2018   Not ready for surgery yet   Chronic kidney disease    Colitis    with colectomy   Diabetes mellitus without complication (HCC)    Dyspnea    covid   Gall stones 01/23/2006   Gastroenteritis 07/25/2014   hosp for IVF    GERD (gastroesophageal reflux disease)    History of kidney stones    HTN (hypertension)    Hyperglycemia    mild-monitors A1C   Obesity    Pneumonia    Rosacea    Sleep apnea    CPAP everynight    Surgical History: Past Surgical History:  Procedure Laterality Date   APPENDECTOMY  08-1991   Along with colon removal   CHOLECYSTECTOMY     COLECTOMY  1993   has ileostomy   COLON SURGERY  2021   removed rectum   COLONOSCOPY  08/1991   COLOSTOMY REVISION  12/06/2011   Procedure: COLOSTOMY REVISION;  Surgeon: Romie Levee, MD;  Location: [facility];  Service: General;  Laterality: Right;  OSTOMY revision   CYSTOSCOPY/URETEROSCOPY/HOLMIUM LASER/STENT PLACEMENT Left 04/11/2021   Procedure: CYSTOSCOPY/URETEROSCOPY/HOLMIUM LASER/STENT PLACEMENT;  Surgeon: Vanna Scotland, MD;  Location: [facility];  Service: Urology;  Laterality: Left;   EXAMINATION UNDER ANESTHESIA  12/06/2011   Procedure: EXAM UNDER ANESTHESIA;  Surgeon: Romie Levee, MD;  Location: [facility];  Service: General;  Laterality: N/A;  rectal exam under anesthesia, anal dilation, pouchoscopy  SPINE SURGERY  05/12/2022   2 fractured vertebra   TOTAL ABDOMINAL HYSTERECTOMY  05/2006   for abscessed ovaries    Home Medications:  Allergies as of 08/02/2022       Reactions   Ciprofloxacin Rash   Erythema and pruritis around IV site, erythema and rash along vein   Other Rash   Jardiance [empagliflozin]    Increase in Cr    Lisinopril    REACTION: felt bad/ stomach hurt/ weak and tired   Metformin And Related    GI   Sulfa Antibiotics Rash         Medication List        Accurate as of August 02, 2022  5:00 PM. If you have any questions, ask your nurse or doctor.          Accu-Chek Guide test strip Generic drug: glucose blood TO CHECK GLUCOSE DAILY AND AS NEEDED FOR TYPE 2 DIABETES   Accu-Chek Guide w/Device Kit To check glucose daily and as needed for type 2 diabetes   accu-chek multiclix lancets To check glucose daily and as needed for diabetes type 2   acetaminophen 325 MG tablet Commonly known as: TYLENOL Take 650 mg by mouth every 6 (six) hours as needed for moderate pain.   atorvastatin 10 MG tablet Commonly known as: LIPITOR TAKE 1/2 PILL BY MOUTH EVERY OTHER DAY What changed: See the new instructions.   B-12 1000 MCG Tabs Take 1 tablet by mouth daily.   Cyanocobalamin 1000 MCG/ML Kit Inject 1 mL as directed once a week. X4 weeks   cefdinir 300 MG capsule Commonly known as: OMNICEF Take 1 capsule (300 mg total) by mouth 2 (two) times daily for 9 days. Started by: Carman Ching, PA-C   esomeprazole 20 MG capsule Commonly known as: NEXIUM Take 20 mg by mouth every other day.   FLUoxetine 10 MG capsule Commonly known as: PROZAC Take 1 capsule (10 mg total) by mouth daily.   fluticasone 50 MCG/ACT nasal spray Commonly known as: FLONASE Place 2 sprays into both nostrils at bedtime.   FreeStyle Libre 3 Sensor Misc Use as directed for diabetes type 2 requiring insulin   losartan 25 MG tablet Commonly known as: COZAAR Take 25 mg by mouth daily.   metoprolol tartrate 25 MG tablet Commonly known as: LOPRESSOR TAKE 1 TABLET BY MOUTH EVERY DAY   multivitamin tablet Take 1 tablet by mouth daily.   NON FORMULARY SLEEPS WITH BI-PAP   phenazopyridine 200 MG tablet Commonly known as: Pyridium Take 1 tablet (200 mg total) by mouth 3 (three) times daily as needed for pain. Started by: Carman Ching, PA-C   simethicone 80 MG chewable tablet Commonly known as: MYLICON Chew  1 tablet (80 mg total) by mouth 4 (four) times daily as needed for flatulence.   tirzepatide 7.5 MG/0.5ML Pen Commonly known as: MOUNJARO Inject 7.5 mg into the skin once a week.   Vitamin D3 25 MCG (1000 UT) Caps Take 1,000 Units by mouth daily.   zolpidem 10 MG tablet Commonly known as: AMBIEN Take 0.5 tablets (5 mg total) by mouth at bedtime as needed for sleep. Caution of sedation and falls        Allergies:  Allergies  Allergen Reactions   Ciprofloxacin Rash    Erythema and pruritis around IV site, erythema and rash along vein   Other Rash   Jardiance [Empagliflozin]     Increase in Cr    Lisinopril  REACTION: felt bad/ stomach hurt/ weak and tired   Metformin And Related     GI   Sulfa Antibiotics Rash    Family History: Family History  Problem Relation Age of Onset   Hypertension Mother    Hyperlipidemia Mother    Cirrhosis Mother        NASH   Diabetes Mother    Liver cancer Father        resection secondary to mets   Cancer Father        colon   Hyperlipidemia Father    Hypertension Father    Allergies Father    Ulcerative colitis Father    Arthritis Father    Allergies Brother    Gastric cancer Brother    Cancer Brother    Breast cancer Paternal Grandmother    Breast cancer Cousin 42       paternal cousins   ADD / ADHD Son     Social History:   reports that she has never smoked. She has never used smokeless tobacco. She reports that she does not drink alcohol and does not use drugs.  Physical Exam: BP (!) 148/77   Pulse 74   Wt 250 lb (113.4 kg)   BMI 41.60 kg/m   Constitutional:  Alert and oriented, no acute distress, nontoxic appearing HEENT: [place], AT Cardiovascular: No clubbing, cyanosis, or edema Respiratory: Normal respiratory effort, no increased work of breathing Skin: No rashes, bruises or suspicious lesions Neurologic: Grossly intact, no focal deficits, moving all 4 extremities Psychiatric: Normal mood and  affect  Laboratory Data: Results for orders placed or performed in visit on 08/02/22  Microscopic Examination   Urine  Result Value Ref Range   WBC, UA >30 (A) 0 - 5 /hpf   RBC, Urine 3-10 (A) 0 - 2 /hpf   Epithelial Cells (non renal) 0-10 0 - 10 /hpf   Bacteria, UA Many (A) None seen/Few  Urinalysis, Complete  Result Value Ref Range   Specific Gravity, UA <1.005 (L) 1.005 - 1.030   pH, UA 5.0 5.0 - 7.5   Color, UA Orange Yellow   Appearance Ur Hazy (A) Clear   Leukocytes,UA 1+ (A) Negative   Protein,UA 1+ (A) Negative/Trace   Glucose, UA 1+ (A) Negative   Ketones, UA Negative Negative   RBC, UA Trace (A) Negative   Bilirubin, UA Negative Negative   Urobilinogen, Ur 1.0 0.2 - 1.0 mg/dL   Nitrite, UA Positive (A) Negative   Microscopic Examination See below:   Bladder Scan (Post Void Residual) in office  Result Value Ref Range   Scan Result    Pertinent Imaging: Results for orders placed during the hospital encounter of 08/02/22  CT RENAL STONE STUDY  Narrative CLINICAL DATA:  Abdominal/flank pain, stone suspected  EXAM: CT ABDOMEN AND PELVIS WITHOUT CONTRAST  TECHNIQUE: Multidetector CT imaging of the abdomen and pelvis was performed following the standard protocol without IV contrast.  RADIATION DOSE REDUCTION: This exam was performed according to the departmental dose-optimization program which includes automated exposure control, adjustment of the mA and/or kV according to patient size and/or use of iterative reconstruction technique.  COMPARISON:  05/19/2022  FINDINGS: Lower chest: No acute abnormality.  Hepatobiliary: Cirrhotic liver morphology with surface nodularity. Subtle area of ill-defined hypoattenuation in the subcapsular aspect of the anterolateral right hepatic lobe measuring approximately 3.1 x 2.8 cm (series 2, image 21). Prior cholecystectomy.  Pancreas: Unremarkable. No pancreatic ductal dilatation or surrounding inflammatory  changes.  Spleen:  Normal in size without focal abnormality.  Adrenals/Urinary Tract: Unremarkable adrenal glands. Two adjacent nonobstructing stones within the lower pole of the left kidney, each measuring approximately 4 mm. No right-sided nephrolithiasis. No hydronephrosis. Ureters are unremarkable. Circumferential urinary bladder wall thickening with perivesicular fat stranding.  Stomach/Bowel: Stomach within normal limits. Status post total proctocolectomy with right lower quadrant ileostomy. No bowel wall thickening or inflammatory changes.  Vascular/Lymphatic: Aortic atherosclerosis. No enlarged abdominal or pelvic lymph nodes.  Reproductive: Status post hysterectomy. No adnexal masses.  Other: No ascites. No pneumoperitoneum. Stable calcifications along the inferior margin of the liver and within the lower right paracolic gutter.  Musculoskeletal: No new or acute bony findings. Healed L1 compression fracture. Prior T11 to L3 spinal fusion. Bilateral hip osteoarthritis.  IMPRESSION: 1. Circumferential urinary bladder wall thickening with perivesicular fat stranding suggestive of cystitis. Correlate with urinalysis. 2. Nonobstructing left renal stones. No hydronephrosis. 3. Cirrhotic liver morphology with subtle area of ill-defined hypoattenuation in the subcapsular aspect of the right hepatic lobe measuring approximately 3.1 x 2.8 cm. Recommend further evaluation with nonemergent MRI of the abdomen with and without contrast. 4. Status post total proctocolectomy with right lower quadrant ileostomy. 5. Aortic atherosclerosis (ICD10-I70.0).   Electronically Signed By: Duanne Guess D.O. On: 08/02/2022 10:24  I personally reviewed the images referenced above and note bladder wall thickening and perivesical stranding without evidence of hydronephrosis or obstructing stone.  Assessment & Plan:   1. Recurrent UTI Recurrent irritative voiding symptoms and grossly  infected appearing UA.  PVR only slightly elevated, no indication for urgent intervention at this time.  Will treat her aggressively with IM Rocephin transition to 9 days of cefdinir for total of 10 days of antibiotic therapy.  I am also sending in Pyridium for symptom control.  Will plan to put her on at least 3 months of suppressive antibiotics immediately after completion of the treatment course of antibiotics, though will defer prescribing these pending her urine culture results from today.  Despite prior abdominal pelvic surgery, I think she is at low risk for colovesical fistula in the absence of fecaluria and pneumaturia and with rather unremarkable cystoscopy last month. - Urinalysis, Complete - CULTURE, URINE COMPREHENSIVE - Bladder Scan (Post Void Residual) in office - cefTRIAXone (ROCEPHIN) injection 1 g - phenazopyridine (PYRIDIUM) 200 MG tablet; Take 1 tablet (200 mg total) by mouth 3 (three) times daily as needed for pain.  Dispense: 10 tablet; Refill: 0 - cefdinir (OMNICEF) 300 MG capsule; Take 1 capsule (300 mg total) by mouth 2 (two) times daily for 9 days.  Dispense: 18 capsule; Refill: 0  Return for Will call to start suppressive abx per cx results.  Carman Ching, PA-C  [facility] [place] [address], [address] [place], [place] [address] [phone]

## 2022-08-02 NOTE — Patient Instructions (Signed)
You received a shot of the antibiotic Rocephin (ceftriaxone) in clinic today. That antibiotic lasts 24 hours. Please start 9 days of Omnicef (cefdinir) tomorrow evening. I will call you to start you on suppressive antibiotics based on your urine culture results. Take Pyridium as needed for pain, do not take longer than 3 days continuously. Ok to start a daily cranberry supplement for UTI prevention.

## 2022-08-03 ENCOUNTER — Ambulatory Visit (INDEPENDENT_AMBULATORY_CARE_PROVIDER_SITE_OTHER): Payer: Medicare PPO

## 2022-08-03 DIAGNOSIS — E538 Deficiency of other specified B group vitamins: Secondary | ICD-10-CM | POA: Diagnosis not present

## 2022-08-03 MED ORDER — CYANOCOBALAMIN 1000 MCG/ML IJ SOLN
1000.0000 ug | Freq: Once | INTRAMUSCULAR | Status: AC
Start: 2022-08-03 — End: 2022-08-03
  Administered 2022-08-03: 1000 ug via INTRAMUSCULAR

## 2022-08-03 NOTE — Progress Notes (Signed)
.  buarocephin

## 2022-08-03 NOTE — Telephone Encounter (Signed)
Last filled on 07/11/22 #2 mL with 0 refills   3 month f/u scheduled on 10/12/22

## 2022-08-03 NOTE — Progress Notes (Addendum)
Patient was given Rocephin 2.35ml with 1% lidocaine, patient tolerated it well, was given on right upper quadrant , patient waited 15 minutes before leaving. No complications noted with patient.   Given by: [place] Pink RMA   F/U as scheduled

## 2022-08-03 NOTE — Progress Notes (Signed)
Per orders of Dr. Shawnie Dapper is out of offucer and Dr Tillman Abide who is in office injection of vitamin b 12 given by Lewanda Rife in left deltoid.Patient tolerated injection well. This last of 4 weekly b 12 injections.pt has FU nappt with Dr Milinda Antis already scheduled.Marland Kitchen

## 2022-08-07 ENCOUNTER — Telehealth: Payer: Self-pay

## 2022-08-07 LAB — CULTURE, URINE COMPREHENSIVE

## 2022-08-07 MED ORDER — TRIMETHOPRIM 100 MG PO TABS: 100.0000 mg | ORAL_TABLET | Freq: Every day | ORAL | 0 refills | Status: DC

## 2022-08-07 NOTE — Telephone Encounter (Signed)
Pt aware.   Med erxed.  F./u appt made for 10/22 at 9am.

## 2022-08-07 NOTE — Telephone Encounter (Signed)
-----   Message from [place] sent at 08/07/2022  4:41 PM EDT ----- Please start trimethoprim 100mg  daily x3 months the day after she completes cefdinir and schedule her for office visit follow up with sx recheck, UA in 3 months. She has an allergy to sulfa; I expect she should be able to tolerate trimethoprim. We discussed this in clinic. If she if concerned about trimethoprim, can do Keflex instead per patient preference.

## 2022-08-08 DIAGNOSIS — N1832 Chronic kidney disease, stage 3b: Secondary | ICD-10-CM | POA: Diagnosis not present

## 2022-08-08 DIAGNOSIS — I10 Essential (primary) hypertension: Secondary | ICD-10-CM | POA: Diagnosis not present

## 2022-08-08 DIAGNOSIS — E1122 Type 2 diabetes mellitus with diabetic chronic kidney disease: Secondary | ICD-10-CM | POA: Diagnosis not present

## 2022-08-11 ENCOUNTER — Other Ambulatory Visit: Payer: Self-pay | Admitting: Family Medicine

## 2022-08-12 ENCOUNTER — Emergency Department
Admission: EM | Admit: 2022-08-12 | Discharge: 2022-08-12 | Disposition: A | Discharge status: Home / Self Care | Payer: Medicare PPO | Attending: Emergency Medicine | Admitting: Emergency Medicine

## 2022-08-12 ENCOUNTER — Other Ambulatory Visit: Payer: Self-pay

## 2022-08-12 DIAGNOSIS — E871 Hypo-osmolality and hyponatremia: Secondary | ICD-10-CM | POA: Insufficient documentation

## 2022-08-12 DIAGNOSIS — I129 Hypertensive chronic kidney disease with stage 1 through stage 4 chronic kidney disease, or unspecified chronic kidney disease: Secondary | ICD-10-CM | POA: Insufficient documentation

## 2022-08-12 DIAGNOSIS — N189 Chronic kidney disease, unspecified: Secondary | ICD-10-CM | POA: Diagnosis not present

## 2022-08-12 DIAGNOSIS — R197 Diarrhea, unspecified: Secondary | ICD-10-CM | POA: Diagnosis not present

## 2022-08-12 LAB — CBC
HCT: 42.3 % (ref 36.0–46.0)
Hemoglobin: 13.9 g/dL (ref 12.0–15.0)
MCH: 29.9 pg (ref 26.0–34.0)
MCHC: 32.9 g/dL (ref 30.0–36.0)
MCV: 91 fL (ref 80.0–100.0)
Platelets: 329 10*3/uL (ref 150–400)
RBC: 4.65 MIL/uL (ref 3.87–5.11)
RDW: 14.2 % (ref 11.5–15.5)
WBC: 12.9 10*3/uL — ABNORMAL HIGH (ref 4.0–10.5)
nRBC: 0 % (ref 0.0–0.2)

## 2022-08-12 LAB — COMPREHENSIVE METABOLIC PANEL
ALT: 24 U/L (ref 0–44)
AST: 35 U/L (ref 15–41)
Albumin: 4.2 g/dL (ref 3.5–5.0)
Alkaline Phosphatase: 90 U/L (ref 38–126)
Anion gap: 12 (ref 5–15)
BUN: 17 mg/dL (ref 8–23)
CO2: 22 mmol/L (ref 22–32)
Calcium: 9.5 mg/dL (ref 8.9–10.3)
Chloride: 97 mmol/L — ABNORMAL LOW (ref 98–111)
Creatinine, Ser: 1.49 mg/dL — ABNORMAL HIGH (ref 0.44–1.00)
GFR, Estimated: 38 mL/min — ABNORMAL LOW (ref >60)
Glucose, Bld: 189 mg/dL — ABNORMAL HIGH (ref 70–99)
Potassium: 3.7 mmol/L (ref 3.5–5.1)
Sodium: 131 mmol/L — ABNORMAL LOW (ref 135–145)
Total Bilirubin: 1.4 mg/dL — ABNORMAL HIGH (ref 0.3–1.2)
Total Protein: 8 g/dL (ref 6.5–8.1)

## 2022-08-12 LAB — URINALYSIS, ROUTINE W REFLEX MICROSCOPIC
Bilirubin Urine: NEGATIVE
Glucose, UA: NEGATIVE mg/dL
Hgb urine dipstick: NEGATIVE
Ketones, ur: NEGATIVE mg/dL
Leukocytes,Ua: NEGATIVE
Nitrite: NEGATIVE
Protein, ur: NEGATIVE mg/dL
Specific Gravity, Urine: 1.011 (ref 1.005–1.030)
pH: 6 (ref 5.0–8.0)

## 2022-08-12 LAB — LIPASE, BLOOD: Lipase: 52 U/L — ABNORMAL HIGH (ref 11–51)

## 2022-08-12 MED ORDER — LACTATED RINGERS IV BOLUS
1000.0000 mL | Freq: Once | INTRAVENOUS | Status: AC
  Administered 2022-08-12: 1000 mL via INTRAVENOUS

## 2022-08-12 NOTE — Discharge Instructions (Signed)
Diarrhea may be due to antibiotics. Please discuss this with PCP or urology.  Return  to the ER for symptoms that change or worsen.

## 2022-08-12 NOTE — ED Provider Notes (Signed)
[facility] Provider Note    Event Date/Time   First MD Initiated Contact with Patient 08/12/22 1059     (approximate)   History   Abdominal Pain   HPI  KINJAL NEITZKE is a 67 y.o. female  with history of CKD, UTI, HTN, ostomy and as listed in EMR presents to the emergency department for evaluation for dehydration. She has had diarrhea since yesterday. She emptied her ostomy bag about 10 times. She has started Immodium. She was advised to come in to make sure she wasn't getting dehydrated. She denies nausea, vomiting, or abdominal pain. She has been on several rounds of antibiotics for UTI that has been resistant to treatment. She will take her last dose tomorrow before starting preventative treatment.      Physical Exam   Triage Vital Signs: ED Triage Vitals  Encounter Vitals Group     BP 08/12/22 0903 (!) 150/75     Systolic BP Percentile --      Diastolic BP Percentile --      Pulse Rate 08/12/22 0903 96     Resp 08/12/22 0903 20     Temp 08/12/22 0903 98 F (36.7 C)     Temp Source 08/12/22 0903 Oral     SpO2 08/12/22 0903 98 %     Weight 08/12/22 0908 245 lb (111.1 kg)     Height 08/12/22 0908 5\' 5"  (1.651 m)     Head Circumference --      Peak Flow --      Pain Score 08/12/22 0908 0     Pain Loc --      Pain Education --      Exclude from Growth Chart --     Most recent vital signs: Vitals:   08/12/22 0903  BP: (!) 150/75  Pulse: 96  Resp: 20  Temp: 98 F (36.7 C)  SpO2: 98%    General: Awake, no distress.  CV:  Good peripheral perfusion.  Resp:  Normal effort.  Abd:  No distention.  Other:  Watery brown diarrhea noted in ostomy bag.   ED Results / Procedures / Treatments   Labs (all labs ordered are listed, but only abnormal results are displayed) Labs Reviewed  LIPASE, BLOOD - Abnormal; Notable for the following components:      Result Value   Lipase 52 (*)    All other components within normal limits   COMPREHENSIVE METABOLIC PANEL - Abnormal; Notable for the following components:   Sodium 131 (*)    Chloride 97 (*)    Glucose, Bld 189 (*)    Creatinine, Ser 1.49 (*)    Total Bilirubin 1.4 (*)    GFR, Estimated 38 (*)    All other components within normal limits  CBC - Abnormal; Notable for the following components:   WBC 12.9 (*)    All other components within normal limits  URINALYSIS, ROUTINE W REFLEX MICROSCOPIC - Abnormal; Notable for the following components:   Color, Urine YELLOW (*)    APPearance HAZY (*)    All other components within normal limits     EKG  Not indicated.   RADIOLOGY  Image and radiology report reviewed and interpreted by me. Radiology report consistent with the same.  Not indicated.   PROCEDURES:  Critical Care performed: No  Procedures   MEDICATIONS ORDERED IN ED:  Medications  lactated ringers bolus 1,000 mL (0 mLs Intravenous Stopped 08/12/22 1426)     IMPRESSION /  MDM / ASSESSMENT AND PLAN / ED COURSE   I have reviewed the triage note.  Differential diagnosis includes, but is not limited to, diarrhea, Viral syndrome, side effect of antibiotic, dehydration.  Patient's presentation is most consistent with acute complicated illness / injury requiring diagnostic workup.  67 year old female presenting to the ER for evaluation due to diarrhea. See HPI.   Watery, brown contents in ostomy bag. No formed stool and no abdominal pain. Unlikely C-diff. Labs show a mild hyponatremia, BUN is just slightly above baseline and T bili is 1.4. Lipase is at baseline at 52. Urinalysis is without ketones or evidence of infection.  While here, patient states that the diarrhea has nearly resolved. She received 1 liter of NS. She is to continue Immodium as directed and will follow up with PCP or urology. If symptoms change or worsen, she is to return to the ER.      FINAL CLINICAL IMPRESSION(S) / ED DIAGNOSES   Final diagnoses:  Diarrhea,  unspecified type     Rx / DC Orders   ED Discharge Orders     None        Note:  This document was prepared using Dragon voice recognition software and may include unintentional dictation errors.   Chinita Pester, FNP 08/12/22 1609    Jene Every, MD 08/12/22 [phone]

## 2022-08-12 NOTE — ED Triage Notes (Signed)
Pt to er, pt states that she has a illiostomy, states that yesterday she started having some diarrhea, states that she has emptied her ostomy about 10 times since yesterday.  States that her md is concerned for dehydration.

## 2022-08-15 ENCOUNTER — Telehealth: Payer: Self-pay | Admitting: *Deleted

## 2022-08-15 NOTE — Telephone Encounter (Signed)
Transition Care Management Unsuccessful Follow-up Telephone Call  Date of discharge and from where:  [facility] 08/12/2022  Attempts:  1st Attempt  Reason for unsuccessful TCM follow-up call:  Left voice message

## 2022-08-16 ENCOUNTER — Telehealth: Payer: Self-pay | Admitting: *Deleted

## 2022-08-16 NOTE — Telephone Encounter (Signed)
Transition Care Management Unsuccessful Follow-up Telephone Call  Date of discharge and from where:  [facility] 08/12/2022  Attempts:  2nd Attempt  Reason for unsuccessful TCM follow-up call:  No answer/busy

## 2022-08-18 ENCOUNTER — Other Ambulatory Visit: Payer: Self-pay | Admitting: Family Medicine

## 2022-08-29 DIAGNOSIS — Z932 Ileostomy status: Secondary | ICD-10-CM | POA: Diagnosis not present

## 2022-08-29 DIAGNOSIS — K5190 Ulcerative colitis, unspecified, without complications: Secondary | ICD-10-CM | POA: Diagnosis not present

## 2022-08-29 DIAGNOSIS — E119 Type 2 diabetes mellitus without complications: Secondary | ICD-10-CM | POA: Diagnosis not present

## 2022-08-29 DIAGNOSIS — R932 Abnormal findings on diagnostic imaging of liver and biliary tract: Secondary | ICD-10-CM | POA: Diagnosis not present

## 2022-08-29 DIAGNOSIS — G4733 Obstructive sleep apnea (adult) (pediatric): Secondary | ICD-10-CM | POA: Diagnosis not present

## 2022-08-30 DIAGNOSIS — N390 Urinary tract infection, site not specified: Secondary | ICD-10-CM

## 2022-08-30 MED ORDER — CEPHALEXIN 250 MG PO CAPS
250.0000 mg | ORAL_CAPSULE | Freq: Every day | ORAL | 0 refills | Status: DC
Start: 2022-08-30 — End: 2022-11-14

## 2022-08-31 DIAGNOSIS — G4733 Obstructive sleep apnea (adult) (pediatric): Secondary | ICD-10-CM | POA: Diagnosis not present

## 2022-09-06 ENCOUNTER — Ambulatory Visit
Admission: RE | Admit: 2022-09-06 | Discharge: 2022-09-06 | Disposition: A | Discharge status: Home / Self Care | Payer: Medicare PPO | Source: Ambulatory Visit | Attending: Family Medicine | Admitting: Family Medicine

## 2022-09-06 DIAGNOSIS — Z1231 Encounter for screening mammogram for malignant neoplasm of breast: Secondary | ICD-10-CM | POA: Diagnosis not present

## 2022-09-06 DIAGNOSIS — Z780 Asymptomatic menopausal state: Secondary | ICD-10-CM | POA: Diagnosis not present

## 2022-09-06 DIAGNOSIS — E2839 Other primary ovarian failure: Secondary | ICD-10-CM | POA: Insufficient documentation

## 2022-09-07 ENCOUNTER — Encounter (INDEPENDENT_AMBULATORY_CARE_PROVIDER_SITE_OTHER): Payer: Self-pay

## 2022-10-05 ENCOUNTER — Other Ambulatory Visit (INDEPENDENT_AMBULATORY_CARE_PROVIDER_SITE_OTHER): Payer: Medicare PPO

## 2022-10-05 ENCOUNTER — Ambulatory Visit (INDEPENDENT_AMBULATORY_CARE_PROVIDER_SITE_OTHER): Payer: Medicare PPO

## 2022-10-05 VITALS — Ht 65.0 in | Wt 250.0 lb

## 2022-10-05 DIAGNOSIS — E1122 Type 2 diabetes mellitus with diabetic chronic kidney disease: Secondary | ICD-10-CM | POA: Diagnosis not present

## 2022-10-05 DIAGNOSIS — Z0000 Encounter for general adult medical examination without abnormal findings: Secondary | ICD-10-CM

## 2022-10-05 DIAGNOSIS — E538 Deficiency of other specified B group vitamins: Secondary | ICD-10-CM | POA: Diagnosis not present

## 2022-10-05 DIAGNOSIS — I10 Essential (primary) hypertension: Secondary | ICD-10-CM

## 2022-10-05 DIAGNOSIS — N184 Chronic kidney disease, stage 4 (severe): Secondary | ICD-10-CM

## 2022-10-05 LAB — BASIC METABOLIC PANEL
BUN: 13 mg/dL (ref 6–23)
CO2: 22 meq/L (ref 19–32)
Calcium: 9 mg/dL (ref 8.4–10.5)
Chloride: 107 meq/L (ref 96–112)
Creatinine, Ser: 1.2 mg/dL (ref 0.40–1.20)
GFR: 46.57 mL/min — ABNORMAL LOW (ref >60.00)
Glucose, Bld: 126 mg/dL — ABNORMAL HIGH (ref 70–99)
Potassium: 3.9 meq/L (ref 3.5–5.1)
Sodium: 140 meq/L (ref 135–145)

## 2022-10-05 LAB — VITAMIN B12: Vitamin B-12: 319 pg/mL (ref 211–911)

## 2022-10-05 LAB — HEMOGLOBIN A1C: Hgb A1c MFr Bld: 6.9 % — ABNORMAL HIGH (ref 4.6–6.5)

## 2022-10-05 NOTE — Progress Notes (Signed)
Subjective:   Jamie [name] is a 67 y.o. female who presents for Medicare Annual (Subsequent) preventive examination.  Visit Complete: Virtual  I connected with  Jamie [name] on 10/05/22 by a audio enabled telemedicine application and verified that I am speaking with the correct person using two identifiers.  Patient Location: Home  Provider Location: Home Office  I discussed the limitations of evaluation and management by telemedicine. The patient expressed understanding and agreed to proceed.  Patient Medicare AWV questionnaire was completed by the patient on 10/05/2022; I have confirmed that all information answered by patient is correct and no changes since this date.  Review of Systems    Vital Signs: Unable to obtain new vitals due to this being a telehealth visit.  Cardiac Risk Factors include: advanced age (>85men, >30 women);diabetes mellitus;dyslipidemia;hypertensionNutrition Risk Assessment:  Has the patient had any N/V/D within the last 2 months?  No  Does the patient have any non-healing wounds?  No  Has the patient had any unintentional weight loss or weight gain?  No   Diabetes:  Is the patient diabetic?  Yes  If diabetic, was a CBG obtained today?  No  Did the patient bring in their glucometer from home?  No  How often do you monitor your CBG's? Libre.   Financial Strains and Diabetes Management:  Are you having any financial strains with the device, your supplies or your medication? No .  Does the patient want to be seen by Chronic Care Management for management of their diabetes?  No  Would the patient like to be referred to a Nutritionist or for Diabetic Management?  No   Diabetic Exams:  Diabetic Eye Exam: Completed 12/2021 Diabetic Foot Exam: Overdue, Pt has been advised about the importance in completing this exam. Pt is scheduled for diabetic foot exam on next office visit .      Objective:    Today's Vitals   10/05/22 1433  Weight: 250 lb  (113.4 kg)  Height: 5\' 5"  (1.651 m)   Body mass index is 41.6 kg/m.     10/05/2022    2:37 PM 08/12/2022    9:10 AM 05/12/2022    6:57 AM 05/31/2021   10:33 AM 05/30/2021    7:31 AM 05/30/2021    5:09 AM 05/26/2021   10:47 AM  Advanced Directives  Does Patient Have a Medical Advance Directive? Yes Yes No Yes  Yes Yes  Type of Estate agent of [place];Living will Healthcare Power of [place];Living will  Healthcare Power of [place];Living will  Living will;Healthcare Power of Attorney Living will;Healthcare Power of Attorney  Does patient want to make changes to medical advance directive?     No - Guardian declined    Copy of Healthcare Power of Attorney in Chart? No - copy requested   No - copy requested     Would patient like information on creating a medical advance directive?   No - Patient declined        Current Medications (verified) Outpatient Encounter Medications as of 10/05/2022  Medication Sig   ACCU-CHEK GUIDE test strip TO CHECK GLUCOSE DAILY AND AS NEEDED FOR TYPE 2 DIABETES   acetaminophen (TYLENOL) 325 MG tablet Take 650 mg by mouth every 6 (six) hours as needed for moderate pain.   atorvastatin (LIPITOR) 10 MG tablet TAKE 1/2 PILL BY MOUTH EVERY OTHER DAY (Patient taking differently: Take 5 mg by mouth every other day.)   Blood Glucose Monitoring Suppl (ACCU-CHEK  GUIDE) w/Device KIT To check glucose daily and as needed for type 2 diabetes   cephALEXin (KEFLEX) 250 MG capsule Take 1 capsule (250 mg total) by mouth daily.   Cholecalciferol (VITAMIN D3) 25 MCG (1000 UT) CAPS Take 1,000 Units by mouth daily.   Continuous Glucose Sensor (FREESTYLE LIBRE 3 SENSOR) MISC Use as directed for diabetes type 2 requiring insulin   Cyanocobalamin (B-12) 1000 MCG TABS Take 1 tablet by mouth daily.   Cyanocobalamin 1000 MCG/ML KIT Inject 1 mL as directed once a week. X4 weeks   esomeprazole (NEXIUM) 20 MG capsule Take 20 mg by mouth every other day.   FLUoxetine  (PROZAC) 10 MG capsule TAKE 1 CAPSULE BY MOUTH EVERY DAY   fluticasone (FLONASE) 50 MCG/ACT nasal spray Place 2 sprays into both nostrils at bedtime.   Lancets (ACCU-CHEK MULTICLIX) lancets To check glucose daily and as needed for diabetes type 2   losartan (COZAAR) 25 MG tablet Take 25 mg by mouth daily.   metoprolol tartrate (LOPRESSOR) 25 MG tablet TAKE 1 TABLET BY MOUTH EVERY DAY   Multiple Vitamin (MULTIVITAMIN) tablet Take 1 tablet by mouth daily.   NON FORMULARY SLEEPS WITH BI-PAP   phenazopyridine (PYRIDIUM) 200 MG tablet Take 1 tablet (200 mg total) by mouth 3 (three) times daily as needed for pain.   simethicone (MYLICON) 80 MG chewable tablet Chew 1 tablet (80 mg total) by mouth 4 (four) times daily as needed for flatulence.   tirzepatide (MOUNJARO) 7.5 MG/0.5ML Pen INJECT 7.5 MG SUBCUTANEOUSLY WEEKLY   zolpidem (AMBIEN) 10 MG tablet Take 0.5 tablets (5 mg total) by mouth at bedtime as needed for sleep. Caution of sedation and falls   trimethoprim (TRIMPEX) 100 MG tablet Take 1 tablet (100 mg total) by mouth daily.   No facility-administered encounter medications on file as of 10/05/2022.    Allergies (verified) Ciprofloxacin, Other, Jardiance [empagliflozin], Lisinopril, Metformin and related, and Sulfa antibiotics   History: Past Medical History:  Diagnosis Date   Allergy 1970s   Sulfa drugs   Anemia    Arthritis    generalized   Blood transfusion without reported diagnosis 1979   Bowel obstruction (HCC)    repeated (? possible adhesions) neg EGD   Cataract ~2018   Not ready for surgery yet   Chronic kidney disease    Colitis    with colectomy   Diabetes mellitus without complication (HCC)    Dyspnea    covid   Gall stones 01/23/2006   Gastroenteritis 07/25/2014   hosp for IVF    GERD (gastroesophageal reflux disease)    History of kidney stones    HTN (hypertension)    Hyperglycemia    mild-monitors A1C   Obesity    Pneumonia    Rosacea    Sleep apnea     CPAP everynight   Past Surgical History:  Procedure Laterality Date   APPENDECTOMY  08-1991   Along with colon removal   CHOLECYSTECTOMY     COLECTOMY  1993   has ileostomy   COLON SURGERY  2021   removed rectum   COLONOSCOPY  08/1991   COLOSTOMY REVISION  12/06/2011   Procedure: COLOSTOMY REVISION;  Surgeon: Romie Levee, MD;  Location: [facility] [place];  Service: General;  Laterality: Right;  OSTOMY revision   CYSTOSCOPY/URETEROSCOPY/HOLMIUM LASER/STENT PLACEMENT Left 04/11/2021   Procedure: CYSTOSCOPY/URETEROSCOPY/HOLMIUM LASER/STENT PLACEMENT;  Surgeon: Vanna Scotland, MD;  Location: [facility];  Service: Urology;  Laterality: Left;   EXAMINATION UNDER ANESTHESIA  12/06/2011   Procedure: EXAM UNDER ANESTHESIA;  Surgeon: Romie Levee, MD;  Location: [facility];  Service: General;  Laterality: N/A;  rectal exam under anesthesia, anal dilation, pouchoscopy     SPINE SURGERY  05/12/2022   2 fractured vertebra   TOTAL ABDOMINAL HYSTERECTOMY  05/2006   for abscessed ovaries   Family History  Problem Relation Age of Onset   Hypertension Mother    Hyperlipidemia Mother    Cirrhosis Mother        NASH   Diabetes Mother    Liver cancer Father        resection secondary to mets   Cancer Father        colon   Hyperlipidemia Father    Hypertension Father    Allergies Father    Ulcerative colitis Father    Arthritis Father    Allergies Brother    Gastric cancer Brother    Cancer Brother    Breast cancer Paternal Grandmother    Breast cancer Cousin 69       paternal cousins   ADD / ADHD Son    Social History   Socioeconomic History   Marital status: Married    Spouse name: [name]   Number of children: 3   Years of education: Not on file   Highest education level: Bachelor's degree (e.g., BA, AB, BS)  Occupational History   Occupation: Guardian Ad Litem  Tobacco Use   Smoking status: Never   Smokeless tobacco: Never  Vaping Use   Vaping  status: Never Used  Substance and Sexual Activity   Alcohol use: No   Drug use: Never   Sexual activity: Not Currently    Birth control/protection: Surgical  Other Topics Concern   Not on file  Social History Narrative   Not on file   Social Determinants of Health   Financial Resource Strain: Low Risk  (10/05/2022)   Overall Financial Resource Strain (CARDIA)    Difficulty of Paying Living Expenses: Not hard at all  Food Insecurity: No Food Insecurity (10/05/2022)   Hunger Vital Sign    Worried About Running Out of Food in the Last Year: Never true    Ran Out of Food in the Last Year: Never true  Transportation Needs: No Transportation Needs (10/05/2022)   PRAPARE - Administrator, Civil Service (Medical): No    Lack of Transportation (Non-Medical): No  Physical Activity: Inactive (10/05/2022)   Exercise Vital Sign    Days of Exercise per Week: 0 days    Minutes of Exercise per Session: 0 min  Stress: No Stress Concern Present (10/05/2022)   [employer] of Occupational Health - Occupational Stress Questionnaire    Feeling of Stress : Not at all  Recent Concern: Stress - Stress Concern Present (07/20/2022)   [employer] of Occupational Health - Occupational Stress Questionnaire    Feeling of Stress : To some extent  Social Connections: Socially Integrated (10/05/2022)   Social Connection and Isolation Panel [NHANES]    Frequency of Communication with Friends and Family: More than three times a week    Frequency of Social Gatherings with Friends and Family: More than three times a week    Attends Religious Services: More than 4 times per year    Active Member of [employer] or Organizations: Yes    Attends Engineer, structural: More than 4 times per year    Marital Status: Married    Tobacco Counseling Counseling given: Not  Answered   Clinical Intake:  Pre-visit preparation completed: Yes  Pain : No/denies pain     Nutritional Risks:  None Diabetes: Yes CBG done?: No Did pt. bring in CBG monitor from home?: No  How often do you need to have someone help you when you read instructions, pamphlets, or other written materials from your doctor or pharmacy?: 1 - Never  Interpreter Needed?: No  Information entered by :: Renie Ora, LPN   Activities of Daily Living    10/05/2022    2:37 PM 10/05/2022   10:40 AM  In your present state of health, do you have any difficulty performing the following activities:  Hearing? 0 0  Vision? 0 0  Difficulty concentrating or making decisions? 0 0  Walking or climbing stairs? 0 0  Dressing or bathing? 0 0  Doing errands, shopping? 0 0  Preparing Food and eating ? N N  Using the Toilet? N N  In the past six months, have you accidently leaked urine? N N  Do you have problems with loss of bowel control? N N  Managing your Medications? N N  Managing your Finances? N N  Housekeeping or managing your Housekeeping? N N    Patient Care Team: Tower, Audrie Gallus, MD as PCP - Buren Kos, MD as Consulting Physician (Hematology and Oncology) Mady Haagensen, MD as Consulting Physician (Nephrology) Vanna Scotland, MD as Consulting Physician (Urology) Kathyrn Sheriff, [facility] (Inactive) as Pharmacist (Pharmacist)  Indicate any recent Medical Services you may have received from other than [facility] providers in the past year (date may be approximate).     Assessment:   This is a routine wellness examination for Enjolie.  Hearing/Vision screen Vision Screening - Comments:: Wears rx glasses - up to date with routine eye exams with  Dr.Porfillio   Goals Addressed             This Visit's Progress    Cut out extra servings         Depression Screen    10/05/2022    2:36 PM 07/11/2022   11:00 AM 05/30/2022   10:43 AM 04/07/2022   12:28 PM 02/06/2022    8:10 AM 05/31/2021   10:35 AM 05/31/2020    9:26 AM  PHQ 2/9 Scores  PHQ - 2 Score 0 1 0 0 0 0 0  PHQ- 9 Score  5 2 0 0  0     Fall Risk    10/05/2022    2:34 PM 10/05/2022   10:40 AM 07/11/2022   11:00 AM 05/30/2022   10:43 AM 04/07/2022   12:28 PM  Fall Risk   Falls in the past year? 1 1 1 1  0  Number falls in past yr: 1 0 0 0 0  Injury with Fall? 1 1 1 1  0  Risk for fall due to : History of fall(s);Impaired balance/gait;Orthopedic patient  History of fall(s) History of fall(s) No Fall Risks  Follow up Education provided;Falls prevention discussed;Falls evaluation completed  Falls evaluation completed Falls evaluation completed Falls evaluation completed    MEDICARE RISK AT HOME: Medicare Risk at Home Any stairs in or around the home?: Yes If so, are there any without handrails?: No Home free of loose throw rugs in walkways, pet beds, electrical cords, etc?: Yes Adequate lighting in your home to reduce risk of falls?: Yes Life alert?: No Use of a cane, walker or w/c?: No Grab bars in the bathroom?: Yes Shower chair or bench in  shower?: Yes Elevated toilet seat or a handicapped toilet?: Yes  TIMED UP AND GO:  Was the test performed?  No    Cognitive Function:        10/05/2022    2:37 PM 05/31/2021   10:37 AM  6CIT Screen  What Year? 0 points 0 points  What month? 0 points 0 points  What time? 0 points 0 points  Count back from 20 0 points 0 points  Months in reverse 0 points 0 points  Repeat phrase 0 points 0 points  Total Score 0 points 0 points    Immunizations Immunization History  Administered Date(s) Administered   Influenza Inj Mdck Quad Pf 10/02/2018, 12/13/2018   Influenza Split 10/13/2010, 10/02/2011, 10/23/2013   Influenza Whole 12/13/2006, 11/04/2009   Influenza, High Dose Seasonal PF 09/29/2019, 10/12/2020, 10/25/2021   Influenza,inj,Quad PF,6+ Mos 10/02/2012, 11/04/2014, 10/11/2017, 10/02/2018   Influenza-Unspecified 10/08/2015, 10/02/2016, 09/29/2019   Moderna Covid-19 Vaccine Bivalent Booster 27yrs & up 10/12/2020   PFIZER Comirnaty(Gray Top)Covid-19 Tri-Sucrose Vaccine  05/31/2020   PFIZER(Purple Top)SARS-COV-2 Vaccination 04/14/2019, 05/05/2019, 11/18/2019   PNEUMOCOCCAL CONJUGATE-20 12/08/2021   Pfizer Covid-19 Vaccine Bivalent Booster 61yrs & up 10/25/2021   Pneumococcal Polysaccharide-23 12/09/2013   Respiratory Syncytial Virus Vaccine,Recomb Aduvanted(Arexvy) 12/08/2021   Td 01/23/2002   Tdap 10/02/2012   Zoster Recombinant(Shingrix) 10/25/2019, 01/06/2020   Zoster, Live 01/08/2015    TDAP status: Due, Education has been provided regarding the importance of this vaccine. Advised may receive this vaccine at local pharmacy or Health Dept. Aware to provide a copy of the vaccination record if obtained from local pharmacy or Health Dept. Verbalized acceptance and understanding.  Flu Vaccine status: Up to date  Pneumococcal vaccine status: Up to date  Covid-19 vaccine status: Completed vaccines  Qualifies for Shingles Vaccine? Yes   Zostavax completed Yes   Shingrix Completed?: Yes  Screening Tests Health Maintenance  Topic Date Due   INFLUENZA VACCINE  08/24/2022   COVID-19 Vaccine (7 - 2023-24 season) 09/24/2022   DTaP/Tdap/Td (3 - Td or Tdap) 10/03/2022   Diabetic kidney evaluation - Urine ACR  02/07/2027 (Originally 12/13/2021)   OPHTHALMOLOGY EXAM  12/24/2022   HEMOGLOBIN A1C  01/03/2023   FOOT EXAM  02/07/2023   Diabetic kidney evaluation - eGFR measurement  08/12/2023   MAMMOGRAM  09/06/2023   Medicare Annual Wellness (AWV)  10/05/2023   Pneumonia Vaccine 40+ Years old  Completed   DEXA SCAN  Completed   Hepatitis C Screening  Completed   Zoster Vaccines- Shingrix  Completed   HPV VACCINES  Aged Out   Colonoscopy  Discontinued    Health Maintenance  Health Maintenance Due  Topic Date Due   INFLUENZA VACCINE  08/24/2022   COVID-19 Vaccine (7 - 2023-24 season) 09/24/2022   DTaP/Tdap/Td (3 - Td or Tdap) 10/03/2022    Colorectal cancer screening: Referral to GI placed declined by patient . Pt aware the office will call re:  appt.  Mammogram status: Completed 09/06/2022. Repeat every year  Bone Density status: Completed 09/06/2022. Results reflect: Bone density results: OSTEOPOROSIS. Repeat every 2 years.  Lung Cancer Screening: (Low Dose CT Chest recommended if Age 59-80 years, 20 pack-year currently smoking OR have quit w/in 15years.) does not qualify.   Lung Cancer Screening Referral: n/a  Additional Screening:  Hepatitis C Screening: does not qualify; Completed 01/05/2015  Vision Screening: Recommended annual ophthalmology exams for early detection of glaucoma and other disorders of the eye. Is the patient up to date with their annual eye  exam?  Yes  Who is the provider or what is the name of the office in which the patient attends annual eye exams? Dr.Portfillio If pt is not established with a provider, would they like to be referred to a provider to establish care? No .   Dental Screening: Recommended annual dental exams for proper oral hygiene   Community Resource Referral / Chronic Care Management: CRR required this visit?  No   CCM required this visit?  No     Plan:     I have personally reviewed and noted the following in the patient's chart:   Medical and social history Use of alcohol, tobacco or illicit drugs  Current medications and supplements including opioid prescriptions. Patient is not currently taking opioid prescriptions. Functional ability and status Nutritional status Physical activity Advanced directives List of other physicians Hospitalizations, surgeries, and ER visits in previous 12 months Vitals Screenings to include cognitive, depression, and falls Referrals and appointments  In addition, I have reviewed and discussed with patient certain preventive protocols, quality metrics, and best practice recommendations. A written personalized care plan for preventive services as well as general preventive health recommendations were provided to patient.     Lorrene Reid, LPN   1/32/4401   After Visit Summary: (MyChart) Due to this being a telephonic visit, the after visit summary with patients personalized plan was offered to patient via MyChart   Nurse Notes: Due Tdap Vaccine

## 2022-10-05 NOTE — Patient Instructions (Signed)
Jamie [name] , Thank you for taking time to come for your Medicare Wellness Visit. I appreciate your ongoing commitment to your health goals. Please review the following plan we discussed and let me know if I can assist you in the future.   Referrals/Orders/Follow-Ups/Clinician Recommendations: Aim for 30 minutes of exercise or brisk walking, 6-8 glasses of water, and 5 servings of fruits and vegetables each day.   This is a list of the screening recommended for you and due dates:  Health Maintenance  Topic Date Due   Flu Shot  08/24/2022   COVID-19 Vaccine (7 - 2023-24 season) 09/24/2022   DTaP/Tdap/Td vaccine (3 - Td or Tdap) 10/03/2022   Yearly kidney health urinalysis for diabetes  02/07/2027*   Eye exam for diabetics  12/24/2022   Hemoglobin A1C  01/03/2023   Complete foot exam   02/07/2023   Yearly kidney function blood test for diabetes  08/12/2023   Mammogram  09/06/2023   Medicare Annual Wellness Visit  10/05/2023   Pneumonia Vaccine  Completed   DEXA scan (bone density measurement)  Completed   Hepatitis C Screening  Completed   Zoster (Shingles) Vaccine  Completed   HPV Vaccine  Aged Out   Colon Cancer Screening  Discontinued  *Topic was postponed. The date shown is not the original due date.    Advanced directives: (Copy Requested) Please bring a copy of your health care power of attorney and living will to the office to be added to your chart at your convenience.  Next Medicare Annual Wellness Visit scheduled for next year: Yes  Insert Preventive Care attachment Insert FALL PREVENTION attachment if needed

## 2022-10-12 ENCOUNTER — Ambulatory Visit: Payer: Medicare PPO | Admitting: Family Medicine

## 2022-10-12 ENCOUNTER — Encounter: Payer: Self-pay | Admitting: Family Medicine

## 2022-10-12 VITALS — BP 129/70 | HR 72 | Temp 98.0°F | Ht 65.0 in | Wt 249.5 lb

## 2022-10-12 DIAGNOSIS — N184 Chronic kidney disease, stage 4 (severe): Secondary | ICD-10-CM

## 2022-10-12 DIAGNOSIS — R932 Abnormal findings on diagnostic imaging of liver and biliary tract: Secondary | ICD-10-CM

## 2022-10-12 DIAGNOSIS — Z7985 Long-term (current) use of injectable non-insulin antidiabetic drugs: Secondary | ICD-10-CM

## 2022-10-12 DIAGNOSIS — E1169 Type 2 diabetes mellitus with other specified complication: Secondary | ICD-10-CM

## 2022-10-12 DIAGNOSIS — F5089 Other specified eating disorder: Secondary | ICD-10-CM

## 2022-10-12 DIAGNOSIS — E1122 Type 2 diabetes mellitus with diabetic chronic kidney disease: Secondary | ICD-10-CM | POA: Diagnosis not present

## 2022-10-12 DIAGNOSIS — N1832 Chronic kidney disease, stage 3b: Secondary | ICD-10-CM

## 2022-10-12 DIAGNOSIS — E785 Hyperlipidemia, unspecified: Secondary | ICD-10-CM

## 2022-10-12 NOTE — Progress Notes (Signed)
Subjective:    Patient ID: Jamie [name], female    DOB: May 12, 1955, 67 y.o.   MRN: [id number]  HPI  Wt Readings from Last 3 Encounters:  10/12/22 249 lb 8 oz (113.2 kg)  10/05/22 250 lb (113.4 kg)  08/12/22 245 lb (111.1 kg)   41.52 kg/m    Vitals:   10/12/22 0959 10/12/22 1031  BP: (!) 148/62 129/70  Pulse: 72   Temp: 98 F (36.7 C)   SpO2: 97%    Feeling good in general   Pt presents for follow up of DM2 and HTN and lipids and chronic medical problems   HTN bp is stable today  No cp or palpitations or headaches or edema  No side effects to medicines  BP Readings from Last 3 Encounters:  10/12/22 129/70  08/12/22 (!) 150/75  08/02/22 (!) 148/77   Losartan 25 mg daily  Metoprolol 25 mg daily   Blood pressure is good at home   Sees nephrology   Lab Results  Component Value Date   NA 140 10/05/2022   K 3.9 10/05/2022   CO2 22 10/05/2022   GLUCOSE 126 (H) 10/05/2022   BUN 13 10/05/2022   CREATININE 1.20 10/05/2022   CALCIUM 9.0 10/05/2022   GFR 46.57 (L) 10/05/2022   GFRNONAA 38 (L) 08/12/2022   Drinking fluids     DM2 Lab Results  Component Value Date   HGBA1C 6.9 (H) 10/05/2022   This is up from 6.8  Had gone up after spinal injury  Mounjaro 7.5 mg weekly   (last week used 5 instead of 7.5 by mistake and glucose was higher than usual this week)    Some gas and belching-side effects   Was holding insulin and glipizide   Eating has not been the best  Snacking more -not healthy snacks  Salty /crunchy carbs Not sugary   Declines therapy for emotional eating   Is an emotional eater  Taking low dose of prozac for mood  Takes it at bedtime   Sleep is better  Not using her ambien  Using melatonin 3 mg  Has bipap   Has CGM  In normal range 61% of time  (with less mounjaro)  Highest mid day 140s/180s   On proph antibiotic for uti  Originally trimethoprim- that gave her GI upset  Now on kefex instead    Had urology visit /  scan for renal stones  Cirrhotic liver morphology noted   Report: IMPRESSION: 1. Circumferential urinary bladder wall thickening with perivesicular fat stranding suggestive of cystitis. Correlate with urinalysis. 2. Nonobstructing left renal stones. No hydronephrosis. 3. Cirrhotic liver morphology with subtle area of ill-defined hypoattenuation in the subcapsular aspect of the right hepatic lobe measuring approximately 3.1 x 2.8 cm. Recommend further evaluation with nonemergent MRI of the abdomen with and without contrast. 4. Status post total proctocolectomy with right lower quadrant ileostomy. 5. Aortic atherosclerosis (ICD10-I70.0).  Mother had NASH /cirrhosis  Oldest son has fatty liver also   Lab Results  Component Value Date   ALT 24 08/12/2022   AST 35 08/12/2022   ALKPHOS 90 08/12/2022   BILITOT 1.4 (H) 08/12/2022   No etoh  Takes ES tylenol one pill at bedtime   Hyperlipidemia Lab Results  Component Value Date   CHOL 119 07/04/2022   HDL 52.80 07/04/2022   LDLCALC 35 01/31/2022   LDLDIRECT 50.0 07/04/2022   TRIG 215.0 (H) 07/04/2022   CHOLHDL 2 07/04/2022   Atorvastatin 5 mg every  other day   B12 def Lab Results  Component Value Date   VITAMINB12 319 10/05/2022   Improved from 121  1000 mcg B12 daily  Had a shot in June   Patient Active Problem List   Diagnosis Date Noted   Abnormal finding on imaging of liver 10/12/2022   Long-term current use of injectable noninsulin antidiabetic medication 10/12/2022   Panic anxiety syndrome 07/20/2022   Encounter for screening mammogram for breast cancer 07/11/2022   Estrogen deficiency 07/11/2022   Low serum vitamin B12 07/04/2022   Current use of proton pump inhibitor 07/03/2022   Low magnesium level 06/06/2022   Closed tricolumnar fracture of lumbar vertebra (HCC) 05/12/2022   Compression fracture of L1 lumbar vertebra (HCC) 05/11/2022   Morbid obesity (HCC) 05/11/2022   Fall at home, initial encounter  05/11/2022   Chronic kidney disease, stage 3a (HCC) 05/11/2022   Closed fracture of first lumbar vertebra (HCC) 05/11/2022   Closed fracture of twelfth thoracic vertebra (HCC) 05/11/2022   Spinal instability, lumbar 05/11/2022   Hearing loss, right 04/07/2022   Sinus congestion 04/07/2022   Abnormal SPEP 04/28/2021   Abnormal EKG 04/26/2021   CKD (chronic kidney disease) stage 3, GFR 30-59 ml/min (HCC) 05/31/2020   Aortic atherosclerosis (HCC) 03/01/2020   Elevated serum creatinine 05/14/2019   Dermatitis 07/01/2018   Left shoulder pain 11/30/2017   Palpitations 07/05/2017   Lichen planus 04/09/2017   Hyperlipidemia associated with type 2 diabetes mellitus (HCC) 04/10/2016   UTI (urinary tract infection) 10/02/2014   Red blood cell antibody positive 07/02/2013   Elevated C-reactive protein (CRP) 05/15/2012   Routine general medical examination at a health care facility 09/24/2011   Apnea 11/14/2010   Stress reaction, emotional 05/02/2010   Compulsive overeater 05/02/2010   Arthritis    H/O small bowel obstruction    KNEE PAIN 10/14/2009   DM (diabetes mellitus), type 2 with renal complications (HCC) 04/23/2009   ROSACEA 09/12/2007   Essential hypertension 05/15/2007   OSA (obstructive sleep apnea) 12/13/2006   Ulcerative colitis (HCC) 07/12/2006   Past Medical History:  Diagnosis Date   Allergy 1970s   Sulfa drugs   Anemia    Arthritis    generalized   Blood transfusion without reported diagnosis 1979   Bowel obstruction (HCC)    repeated (? possible adhesions) neg EGD   Cataract ~2018   Not ready for surgery yet   Chronic kidney disease    Colitis    with colectomy   Diabetes mellitus without complication (HCC)    Dyspnea    covid   Gall stones 01/23/2006   Gastroenteritis 07/25/2014   hosp for IVF    GERD (gastroesophageal reflux disease)    History of kidney stones    HTN (hypertension)    Hyperglycemia    mild-monitors A1C   Obesity    Pneumonia     Rosacea    Sleep apnea    CPAP everynight   Past Surgical History:  Procedure Laterality Date   APPENDECTOMY  08-1991   Along with colon removal   CHOLECYSTECTOMY     COLECTOMY  1993   has ileostomy   COLON SURGERY  2021   removed rectum   COLONOSCOPY  08/1991   COLOSTOMY REVISION  12/06/2011   Procedure: COLOSTOMY REVISION;  Surgeon: Romie Levee, MD;  Location: [facility];  Service: General;  Laterality: Right;  OSTOMY revision   CYSTOSCOPY/URETEROSCOPY/HOLMIUM LASER/STENT PLACEMENT Left 04/11/2021   Procedure: CYSTOSCOPY/URETEROSCOPY/HOLMIUM LASER/STENT PLACEMENT;  Surgeon:  Vanna Scotland, MD;  Location: [facility];  Service: Urology;  Laterality: Left;   EXAMINATION UNDER ANESTHESIA  12/06/2011   Procedure: EXAM UNDER ANESTHESIA;  Surgeon: Romie Levee, MD;  Location: [facility];  Service: General;  Laterality: N/A;  rectal exam under anesthesia, anal dilation, pouchoscopy     SPINE SURGERY  05/12/2022   2 fractured vertebra   TOTAL ABDOMINAL HYSTERECTOMY  05/2006   for abscessed ovaries   Social History   Tobacco Use   Smoking status: Never   Smokeless tobacco: Never  Vaping Use   Vaping status: Never Used  Substance Use Topics   Alcohol use: No   Drug use: Never   Family History  Problem Relation Age of Onset   Hypertension Mother    Hyperlipidemia Mother    Cirrhosis Mother        NASH   Diabetes Mother    Liver cancer Father        resection secondary to mets   Cancer Father        colon   Hyperlipidemia Father    Hypertension Father    Allergies Father    Ulcerative colitis Father    Arthritis Father    Allergies Brother    Gastric cancer Brother    Cancer Brother    Breast cancer Paternal Grandmother    Breast cancer Cousin 34       paternal cousins   ADD / ADHD Son    Allergies  Allergen Reactions   Ciprofloxacin Rash    Erythema and pruritis around IV site, erythema and rash along vein   Other Rash    Jardiance [Empagliflozin]     Increase in Cr    Lisinopril     REACTION: felt bad/ stomach hurt/ weak and tired   Metformin And Related     GI   Sulfa Antibiotics Rash   Current Outpatient Medications on File Prior to Visit  Medication Sig Dispense Refill   ACCU-CHEK GUIDE test strip TO CHECK GLUCOSE DAILY AND AS NEEDED FOR TYPE 2 DIABETES 100 strip 1   acetaminophen (TYLENOL) 325 MG tablet Take 650 mg by mouth every 6 (six) hours as needed for moderate pain.     atorvastatin (LIPITOR) 10 MG tablet TAKE 1/2 PILL BY MOUTH EVERY OTHER DAY (Patient taking differently: Take 5 mg by mouth every other day.) 24 tablet 1   Blood Glucose Monitoring Suppl (ACCU-CHEK GUIDE) w/Device KIT To check glucose daily and as needed for type 2 diabetes 1 kit 0   cephALEXin (KEFLEX) 250 MG capsule Take 1 capsule (250 mg total) by mouth daily. 90 capsule 0   Cholecalciferol (VITAMIN D3) 25 MCG (1000 UT) CAPS Take 1,000 Units by mouth daily.     Continuous Glucose Sensor (FREESTYLE LIBRE 3 SENSOR) MISC Use as directed for diabetes type 2 requiring insulin 6 each 3   Cyanocobalamin (B-12) 1000 MCG TABS Take 1 tablet by mouth daily.     Cyanocobalamin 1000 MCG/ML KIT Inject 1 mL as directed once a week. X4 weeks     esomeprazole (NEXIUM) 20 MG capsule Take 20 mg by mouth every other day.     FLUoxetine (PROZAC) 10 MG capsule TAKE 1 CAPSULE BY MOUTH EVERY DAY 90 capsule 0   fluticasone (FLONASE) 50 MCG/ACT nasal spray Place 2 sprays into both nostrils at bedtime.     Lancets (ACCU-CHEK MULTICLIX) lancets To check glucose daily and as needed for diabetes type 2 100 each 3  losartan (COZAAR) 25 MG tablet Take 25 mg by mouth daily.     metoprolol tartrate (LOPRESSOR) 25 MG tablet TAKE 1 TABLET BY MOUTH EVERY DAY 90 tablet 0   Multiple Vitamin (MULTIVITAMIN) tablet Take 1 tablet by mouth daily.     NON FORMULARY SLEEPS WITH BI-PAP     phenazopyridine (PYRIDIUM) 200 MG tablet Take 1 tablet (200 mg total) by mouth 3  (three) times daily as needed for pain. 10 tablet 0   simethicone (MYLICON) 80 MG chewable tablet Chew 1 tablet (80 mg total) by mouth 4 (four) times daily as needed for flatulence. 30 tablet 0   tirzepatide (MOUNJARO) 7.5 MG/0.5ML Pen INJECT 7.5 MG SUBCUTANEOUSLY WEEKLY 2 mL 2   zolpidem (AMBIEN) 10 MG tablet Take 0.5 tablets (5 mg total) by mouth at bedtime as needed for sleep. Caution of sedation and falls 15 tablet 3   No current facility-administered medications on file prior to visit.    Review of Systems  Constitutional:  Negative for activity change, appetite change, fatigue, fever and unexpected weight change.  HENT:  Negative for congestion, ear pain, rhinorrhea, sinus pressure and sore throat.   Eyes:  Negative for pain, redness and visual disturbance.  Respiratory:  Negative for cough, shortness of breath and wheezing.   Cardiovascular:  Negative for chest pain and palpitations.  Gastrointestinal:  Negative for abdominal pain, blood in stool, constipation and diarrhea.  Endocrine: Negative for polydipsia and polyuria.  Genitourinary:  Negative for dysuria, frequency and urgency.  Musculoskeletal:  Negative for arthralgias, back pain and myalgias.  Skin:  Negative for pallor and rash.  Allergic/Immunologic: Negative for environmental allergies.  Neurological:  Negative for dizziness, syncope and headaches.  Hematological:  Negative for adenopathy. Does not bruise/bleed easily.  Psychiatric/Behavioral:  Negative for decreased concentration and dysphoric mood. The patient is not nervous/anxious.        Emotional eating        Objective:   Physical Exam Constitutional:      General: She is not in acute distress.    Appearance: Normal appearance. She is well-developed. She is obese. She is not ill-appearing or diaphoretic.  HENT:     Head: Normocephalic and atraumatic.  Eyes:     General: No scleral icterus.    Conjunctiva/sclera: Conjunctivae normal.     Pupils: Pupils  are equal, round, and reactive to light.  Neck:     Thyroid: No thyromegaly.     Vascular: No carotid bruit or JVD.  Cardiovascular:     Rate and Rhythm: Normal rate and regular rhythm.     Heart sounds: Normal heart sounds.     No gallop.  Pulmonary:     Effort: Pulmonary effort is normal. No respiratory distress.     Breath sounds: Normal breath sounds. No wheezing or rales.  Abdominal:     General: Abdomen is protuberant. Bowel sounds are normal. There is no distension or abdominal bruit.     Palpations: Abdomen is soft. There is no shifting dullness, fluid wave, hepatomegaly, splenomegaly, mass or pulsatile mass.     Tenderness: There is no abdominal tenderness. Negative signs include Murphy's sign.     Comments: Ostomy site appears healthy   Musculoskeletal:     Cervical back: Normal range of motion and neck supple.     Right lower leg: No edema.     Left lower leg: No edema.  Lymphadenopathy:     Cervical: No cervical adenopathy.  Skin:    General:  Skin is warm and dry.     Coloration: Skin is not jaundiced or pale.     Findings: No rash.  Neurological:     Mental Status: She is alert.     Coordination: Coordination normal.     Deep Tendon Reflexes: Reflexes are normal and symmetric. Reflexes normal.  Psychiatric:        Mood and Affect: Mood normal.           Assessment & Plan:   Problem List Items Addressed This Visit       Endocrine   DM (diabetes mellitus), type 2 with renal complications (HCC) - Primary    Lab Results  Component Value Date   HGBA1C 6.9 (H) 10/05/2022   Open to increase in moujaro to 10 mg weekly (she will try at home before deciding on new prescription) Encouraged low glycemic diet/ weight loss and exercise as tolerated Has CGM- reviewed   Will try 10 mounjaro next week/ and call for prescription if she tolerates it        Hyperlipidemia associated with type 2 diabetes mellitus (HCC)    Disc goals for lipids and reasons to control  them Rev last labs with pt Rev low sat fat diet in detail Atorvastatin 5 mg every othe day  Good LDL control at 35 Expect trig to improve more as blood glucose does         Genitourinary   CKD (chronic kidney disease) stage 3, GFR 30-59 ml/min (HCC)    Last GFR improved with GFR of 46.5  Continues nephrology care         Other   Abnormal finding on imaging of liver    CT noted cirrhotic liver morphology with ill def hypoattenuation in right lobe  Recommended MRI w/ and w/o contrast Would like to avoid contrast (renal)  Fam history of NASH/ cirrhosis Pt is obese  Will order [place] first   Encouraged weight loss-chance of fatty liver is high   Encouraged to avoid etoh  Avoid over use of acetaminophen       Relevant Orders   US Abdomen Limited   Compulsive overeater    Pt is taking mounjaro-helps a bit   Overall struggling with emotional eating Discussed strategies to tackle this Declines counseling referral for now       Long-term current use of injectable noninsulin antidiabetic medication    Lab Results  Component Value Date   HGBA1C 6.9 (H) 10/05/2022   Mounjaro 7.5 Willing to try a dose of 10 and see how tolerated   Strongly encouraged weight loss       Morbid obesity (HCC)    Discussed how this problem influences overall health and the risks it imposes  Reviewed plan for weight loss with lower calorie diet (via better food choices (lower glycemic and portion control) along with exercise building up to or more than 30 minutes 5 days per week including some aerobic activity and strength training    Possible fatty liver Encouraged weight loss  Open to trying increase dose of mounjaro  Also need to get to bottom of emotional eating Counseling referral offered

## 2022-10-12 NOTE — Assessment & Plan Note (Signed)
Pt is taking mounjaro-helps a bit   Overall struggling with emotional eating Discussed strategies to tackle this Declines counseling referral for now

## 2022-10-12 NOTE — Assessment & Plan Note (Signed)
CT noted cirrhotic liver morphology with ill def hypoattenuation in right lobe  Recommended MRI w/ and w/o contrast Would like to avoid contrast (renal)  Fam history of NASH/ cirrhosis Pt is obese  Will order [place] first   Encouraged weight loss-chance of fatty liver is high   Encouraged to avoid etoh  Avoid over use of acetaminophen

## 2022-10-12 NOTE — Assessment & Plan Note (Signed)
Lab Results  Component Value Date   HGBA1C 6.9 (H) 10/05/2022   Open to increase in moujaro to 10 mg weekly (she will try at home before deciding on new prescription) Encouraged low glycemic diet/ weight loss and exercise as tolerated Has CGM- reviewed   Will try 10 mounjaro next week/ and call for prescription if she tolerates it

## 2022-10-12 NOTE — Assessment & Plan Note (Signed)
Discussed how this problem influences overall health and the risks it imposes  Reviewed plan for weight loss with lower calorie diet (via better food choices (lower glycemic and portion control) along with exercise building up to or more than 30 minutes 5 days per week including some aerobic activity and strength training    Possible fatty liver Encouraged weight loss  Open to trying increase dose of mounjaro  Also need to get to bottom of emotional eating Counseling referral offered

## 2022-10-12 NOTE — Assessment & Plan Note (Signed)
Disc goals for lipids and reasons to control them Rev last labs with pt Rev low sat fat diet in detail Atorvastatin 5 mg every othe day  Good LDL control at 35 Expect trig to improve more as blood glucose does

## 2022-10-12 NOTE — Assessment & Plan Note (Signed)
Last GFR improved with GFR of 46.5  Continues nephrology care

## 2022-10-12 NOTE — Assessment & Plan Note (Signed)
Lab Results  Component Value Date   HGBA1C 6.9 (H) 10/05/2022   Mounjaro 7.5 Willing to try a dose of 10 and see how tolerated   Strongly encouraged weight loss

## 2022-10-12 NOTE — Patient Instructions (Addendum)
Go up on mounjaroto 10 mg weekly (2 of the 5)  Let us know how you tolerate it and we can send in the 10 mg next     Try and grab a project (puzzle, crochet) when you feel the need to eat emotionally If you want to talk to a counselor let me know  I will work on ultrasound (liver) order  Update [place] if you don't hear in 1-2 weeks  I would rather avoid contrast if possible    Take care of yoruself

## 2022-10-19 ENCOUNTER — Encounter: Payer: Self-pay | Admitting: Family Medicine

## 2022-10-19 ENCOUNTER — Ambulatory Visit
Admission: RE | Admit: 2022-10-19 | Discharge: 2022-10-19 | Disposition: A | Discharge status: Home / Self Care | Payer: Medicare PPO | Source: Ambulatory Visit | Attending: Family Medicine | Admitting: Family Medicine

## 2022-10-19 DIAGNOSIS — R932 Abnormal findings on diagnostic imaging of liver and biliary tract: Secondary | ICD-10-CM

## 2022-10-19 DIAGNOSIS — K7460 Unspecified cirrhosis of liver: Secondary | ICD-10-CM | POA: Diagnosis not present

## 2022-10-19 NOTE — Addendum Note (Signed)
Addended by: Roxy Manns A on: 10/19/2022 05:43 PM   Modules accepted: Orders

## 2022-10-23 ENCOUNTER — Encounter: Payer: Self-pay | Admitting: Family Medicine

## 2022-10-24 ENCOUNTER — Other Ambulatory Visit: Payer: Self-pay | Admitting: Family Medicine

## 2022-10-24 NOTE — Telephone Encounter (Signed)
Last filled on 08/03/22 # 2 mL with 2 refill  F/u on 01/11/23

## 2022-10-25 NOTE — Addendum Note (Signed)
Addended by: Roxy Manns A on: 10/25/2022 08:49 PM   Modules accepted: Orders

## 2022-10-25 NOTE — Telephone Encounter (Signed)
Aware- can you please let the referral team know that she wants to go to [facility] (in [place])? Thanks !

## 2022-10-31 DIAGNOSIS — Z932 Ileostomy status: Secondary | ICD-10-CM | POA: Diagnosis not present

## 2022-10-31 DIAGNOSIS — G4733 Obstructive sleep apnea (adult) (pediatric): Secondary | ICD-10-CM | POA: Diagnosis not present

## 2022-10-31 DIAGNOSIS — K5190 Ulcerative colitis, unspecified, without complications: Secondary | ICD-10-CM | POA: Diagnosis not present

## 2022-10-31 DIAGNOSIS — R932 Abnormal findings on diagnostic imaging of liver and biliary tract: Secondary | ICD-10-CM | POA: Diagnosis not present

## 2022-10-31 DIAGNOSIS — E119 Type 2 diabetes mellitus without complications: Secondary | ICD-10-CM | POA: Diagnosis not present

## 2022-11-12 ENCOUNTER — Other Ambulatory Visit: Payer: Self-pay | Admitting: Family Medicine

## 2022-11-14 ENCOUNTER — Ambulatory Visit: Payer: Medicare PPO | Admitting: Physician Assistant

## 2022-11-14 ENCOUNTER — Encounter: Payer: Self-pay | Admitting: Physician Assistant

## 2022-11-14 VITALS — Ht 65.0 in | Wt 250.0 lb

## 2022-11-14 DIAGNOSIS — R82998 Other abnormal findings in urine: Secondary | ICD-10-CM | POA: Diagnosis not present

## 2022-11-14 DIAGNOSIS — N390 Urinary tract infection, site not specified: Secondary | ICD-10-CM

## 2022-11-14 DIAGNOSIS — Z87440 Personal history of urinary (tract) infections: Secondary | ICD-10-CM

## 2022-11-14 LAB — URINALYSIS, COMPLETE
Bilirubin, UA: NEGATIVE
Glucose, UA: NEGATIVE
Ketones, UA: NEGATIVE
Nitrite, UA: NEGATIVE
RBC, UA: NEGATIVE
Specific Gravity, UA: 1.025 (ref 1.005–1.030)
Urobilinogen, Ur: 0.2 mg/dL (ref 0.2–1.0)
pH, UA: 5.5 (ref 5.0–7.5)

## 2022-11-14 LAB — MICROSCOPIC EXAMINATION: Epithelial Cells (non renal): >10 /[HPF] — AB (ref 0–10)

## 2022-11-14 LAB — BLADDER SCAN AMB NON-IMAGING: Scan Result: 11

## 2022-11-14 MED ORDER — CEPHALEXIN 250 MG PO CAPS: 250.0000 mg | ORAL_CAPSULE | Freq: Every day | ORAL | 0 refills | Status: DC

## 2022-11-14 NOTE — Progress Notes (Signed)
11/14/2022 12:41 PM   TIERRE BELLON [birth year] [id number]  CC: Chief Complaint  Patient presents with   Follow-up   Recurrent UTI   HPI: Jamie [name] is a 67 y.o. female with a recent history of urinary retention following thoracic spinal fusion who subsequently passed a voiding trial but developed recurrent versus persistent UTI who presents today for follow-up on suppressive Keflex x 3 months.   Today she reports no UTIs since her last visit.  She denies dysuria.  She is having some occasional urgency but admits she is pushing fluids.  She reports some sensations of incomplete voiding and straining to fully empty.  She has also noticed newly spraying stream.  In-office UA today positive for trace protein and 1+ leukocytes; urine microscopy with 11-30 WBCs/HPF, >10 epithelial cells/hpf, and moderate bacteria. PVR 11mL.  PMH: Past Medical History:  Diagnosis Date   Allergy 1970s   Sulfa drugs   Anemia    Arthritis    generalized   Blood transfusion without reported diagnosis 1979   Bowel obstruction (HCC)    repeated (? possible adhesions) neg EGD   Cataract ~2018   Not ready for surgery yet   Chronic kidney disease    Colitis    with colectomy   Diabetes mellitus without complication (HCC)    Dyspnea    covid   Gall stones 01/23/2006   Gastroenteritis 07/25/2014   hosp for IVF    GERD (gastroesophageal reflux disease)    History of kidney stones    HTN (hypertension)    Hyperglycemia    mild-monitors A1C   Obesity    Pneumonia    Rosacea    Sleep apnea    CPAP everynight    Surgical History: Past Surgical History:  Procedure Laterality Date   APPENDECTOMY  08-1991   Along with colon removal   CHOLECYSTECTOMY     COLECTOMY  1993   has ileostomy   COLON SURGERY  2021   removed rectum   COLONOSCOPY  08/1991   COLOSTOMY REVISION  12/06/2011   Procedure: COLOSTOMY REVISION;  Surgeon: Romie Levee, MD;  Location: [facility] [place];   Service: General;  Laterality: Right;  OSTOMY revision   CYSTOSCOPY/URETEROSCOPY/HOLMIUM LASER/STENT PLACEMENT Left 04/11/2021   Procedure: CYSTOSCOPY/URETEROSCOPY/HOLMIUM LASER/STENT PLACEMENT;  Surgeon: Vanna Scotland, MD;  Location: [facility];  Service: Urology;  Laterality: Left;   EXAMINATION UNDER ANESTHESIA  12/06/2011   Procedure: EXAM UNDER ANESTHESIA;  Surgeon: Romie Levee, MD;  Location: [facility];  Service: General;  Laterality: N/A;  rectal exam under anesthesia, anal dilation, pouchoscopy     SPINE SURGERY  05/12/2022   2 fractured vertebra   TOTAL ABDOMINAL HYSTERECTOMY  05/2006   for abscessed ovaries    Home Medications:  Allergies as of 11/14/2022       Reactions   Ciprofloxacin Rash   Erythema and pruritis around IV site, erythema and rash along vein   Other Rash   Jardiance [empagliflozin]    Increase in Cr    Lisinopril    REACTION: felt bad/ stomach hurt/ weak and tired   Metformin And Related    GI   Sulfa Antibiotics Rash        Medication List        Accurate as of November 14, 2022 12:41 PM. If you have any questions, ask your nurse or doctor.          Accu-Chek Guide test strip Generic drug: glucose blood TO  CHECK GLUCOSE DAILY AND AS NEEDED FOR TYPE 2 DIABETES   Accu-Chek Guide w/Device Kit To check glucose daily and as needed for type 2 diabetes   accu-chek multiclix lancets To check glucose daily and as needed for diabetes type 2   acetaminophen 325 MG tablet Commonly known as: TYLENOL Take 650 mg by mouth every 6 (six) hours as needed for moderate pain.   atorvastatin 10 MG tablet Commonly known as: LIPITOR TAKE 1/2 PILL BY MOUTH EVERY OTHER DAY What changed: See the new instructions.   B-12 1000 MCG Tabs Take 1 tablet by mouth daily.   Cyanocobalamin 1000 MCG/ML Kit Inject 1 mL as directed once a week. X4 weeks   cephALEXin 250 MG capsule Commonly known as: Keflex Take 1 capsule (250 mg total) by mouth  daily.   esomeprazole 20 MG capsule Commonly known as: NEXIUM Take 20 mg by mouth every other day.   FLUoxetine 10 MG capsule Commonly known as: PROZAC TAKE 1 CAPSULE BY MOUTH EVERY DAY   fluticasone 50 MCG/ACT nasal spray Commonly known as: FLONASE Place 2 sprays into both nostrils at bedtime.   FreeStyle Libre 3 Sensor Misc Use as directed for diabetes type 2 requiring insulin   losartan 25 MG tablet Commonly known as: COZAAR Take 25 mg by mouth daily.   metoprolol tartrate 25 MG tablet Commonly known as: LOPRESSOR TAKE 1 TABLET BY MOUTH EVERY DAY   Mounjaro 7.5 MG/0.5ML Pen Generic drug: tirzepatide INJECT 7.5 MG SUBCUTANEOUSLY WEEKLY   multivitamin tablet Take 1 tablet by mouth daily.   NON FORMULARY SLEEPS WITH BI-PAP   phenazopyridine 200 MG tablet Commonly known as: Pyridium Take 1 tablet (200 mg total) by mouth 3 (three) times daily as needed for pain.   simethicone 80 MG chewable tablet Commonly known as: MYLICON Chew 1 tablet (80 mg total) by mouth 4 (four) times daily as needed for flatulence.   Vitamin D3 25 MCG (1000 UT) Caps Take 1,000 Units by mouth daily.   zolpidem 10 MG tablet Commonly known as: AMBIEN Take 0.5 tablets (5 mg total) by mouth at bedtime as needed for sleep. Caution of sedation and falls        Allergies:  Allergies  Allergen Reactions   Ciprofloxacin Rash    Erythema and pruritis around IV site, erythema and rash along vein   Other Rash   Jardiance [Empagliflozin]     Increase in Cr    Lisinopril     REACTION: felt bad/ stomach hurt/ weak and tired   Metformin And Related     GI   Sulfa Antibiotics Rash    Family History: Family History  Problem Relation Age of Onset   Hypertension Mother    Hyperlipidemia Mother    Cirrhosis Mother        NASH   Diabetes Mother    Liver cancer Father        resection secondary to mets   Cancer Father        colon   Hyperlipidemia Father    Hypertension Father     Allergies Father    Ulcerative colitis Father    Arthritis Father    Allergies Brother    Gastric cancer Brother    Cancer Brother    Breast cancer Paternal Grandmother    Breast cancer Cousin 19       paternal cousins   ADD / ADHD Son     Social History:   reports that she has never smoked.  She has never used smokeless tobacco. She reports that she does not drink alcohol and does not use drugs.  Physical Exam: Ht 5\' 5"  (1.651 m)   Wt 250 lb (113.4 kg)   BMI 41.60 kg/m   Constitutional:  Alert and oriented, no acute distress, nontoxic appearing HEENT: Protection, AT Cardiovascular: No clubbing, cyanosis, or edema Respiratory: Normal respiratory effort, no increased work of breathing Skin: No rashes, bruises or suspicious lesions Neurologic: Grossly intact, no focal deficits, moving all 4 extremities Psychiatric: Normal mood and affect  Laboratory Data: Results for orders placed or performed in visit on 11/14/22  Microscopic Examination   Urine  Result Value Ref Range   WBC, UA 11-30 (A) 0 - 5 /hpf   RBC, Urine 0-2 0 - 2 /hpf   Epithelial Cells (non renal) >10 (A) 0 - 10 /hpf   Bacteria, UA Moderate (A) None seen/Few  Urinalysis, Complete  Result Value Ref Range   Specific Gravity, UA 1.025 1.005 - 1.030   pH, UA 5.5 5.0 - 7.5   Color, UA Yellow Yellow   Appearance Ur Clear Clear   Leukocytes,UA 1+ (A) Negative   Protein,UA Trace (A) Negative/Trace   Glucose, UA Negative Negative   Ketones, UA Negative Negative   RBC, UA Negative Negative   Bilirubin, UA Negative Negative   Urobilinogen, Ur 0.2 0.2 - 1.0 mg/dL   Nitrite, UA Negative Negative   Microscopic Examination See below:   BLADDER SCAN AMB NON-IMAGING  Result Value Ref Range   Scan Result 11 ml    Assessment & Plan:   1. Recurrent UTI She is not clinically infected today, and UA appears contaminated.  Will send for culture, but low suspicion for UTI at this time.  She is emptying appropriately despite some  obstructive voiding symptoms.  I offered her urodynamics, however since she is emptying appropriately I do not think this would change management.  We mutually decided to defer this.  Will continue suppressive antibiotics an additional 3 months and see her back in clinic for symptom check and cath UA.  She is in agreement with this plan. - Urinalysis, Complete - cephALEXin (KEFLEX) 250 MG capsule; Take 1 capsule (250 mg total) by mouth daily.  Dispense: 90 capsule; Refill: 0 - CULTURE, URINE COMPREHENSIVE - BLADDER SCAN AMB NON-IMAGING  Return in about 3 months (around 02/14/2023) for Sx recheck, CATH UA, measured residual.  Carman Ching, PA-C  [facility] [address] [place], [place] [address] [phone]

## 2022-11-16 ENCOUNTER — Other Ambulatory Visit: Payer: Self-pay | Admitting: Family Medicine

## 2022-11-17 LAB — CULTURE, URINE COMPREHENSIVE

## 2022-11-23 ENCOUNTER — Encounter: Payer: Self-pay | Admitting: Family Medicine

## 2022-11-24 NOTE — Telephone Encounter (Signed)
Immunization records updated FYI to [employer] regarding referral

## 2022-11-24 NOTE — Telephone Encounter (Signed)
Would someone please reach out to referrals to check on status? Thanks

## 2022-12-04 DIAGNOSIS — R932 Abnormal findings on diagnostic imaging of liver and biliary tract: Secondary | ICD-10-CM | POA: Diagnosis not present

## 2022-12-04 DIAGNOSIS — Z932 Ileostomy status: Secondary | ICD-10-CM | POA: Diagnosis not present

## 2022-12-04 DIAGNOSIS — K5190 Ulcerative colitis, unspecified, without complications: Secondary | ICD-10-CM | POA: Diagnosis not present

## 2022-12-04 DIAGNOSIS — G4733 Obstructive sleep apnea (adult) (pediatric): Secondary | ICD-10-CM | POA: Diagnosis not present

## 2022-12-04 DIAGNOSIS — E119 Type 2 diabetes mellitus without complications: Secondary | ICD-10-CM | POA: Diagnosis not present

## 2022-12-13 DIAGNOSIS — N1832 Chronic kidney disease, stage 3b: Secondary | ICD-10-CM | POA: Diagnosis not present

## 2022-12-13 DIAGNOSIS — E1122 Type 2 diabetes mellitus with diabetic chronic kidney disease: Secondary | ICD-10-CM | POA: Diagnosis not present

## 2022-12-15 ENCOUNTER — Ambulatory Visit: Payer: Medicare PPO | Admitting: Family Medicine

## 2022-12-15 ENCOUNTER — Encounter: Payer: Self-pay | Admitting: Family Medicine

## 2022-12-15 VITALS — BP 141/80 | HR 77 | Temp 97.8°F | Ht 65.0 in | Wt 266.0 lb

## 2022-12-15 DIAGNOSIS — E119 Type 2 diabetes mellitus without complications: Secondary | ICD-10-CM

## 2022-12-15 DIAGNOSIS — R600 Localized edema: Secondary | ICD-10-CM

## 2022-12-15 DIAGNOSIS — Z7985 Long-term (current) use of injectable non-insulin antidiabetic drugs: Secondary | ICD-10-CM

## 2022-12-15 DIAGNOSIS — N184 Chronic kidney disease, stage 4 (severe): Secondary | ICD-10-CM | POA: Diagnosis not present

## 2022-12-15 DIAGNOSIS — I10 Essential (primary) hypertension: Secondary | ICD-10-CM

## 2022-12-15 DIAGNOSIS — E1122 Type 2 diabetes mellitus with diabetic chronic kidney disease: Secondary | ICD-10-CM

## 2022-12-15 LAB — CBC
HCT: 39.3 % (ref 36.0–46.0)
Hemoglobin: 12.8 g/dL (ref 12.0–15.0)
MCHC: 32.5 g/dL (ref 30.0–36.0)
MCV: 94.7 fL (ref 78.0–100.0)
Platelets: 224 10*3/uL (ref 150.0–400.0)
RBC: 4.15 Mil/uL (ref 3.87–5.11)
RDW: 14.8 % (ref 11.5–15.5)
WBC: 7.4 10*3/uL (ref 4.0–10.5)

## 2022-12-15 LAB — HEPATIC FUNCTION PANEL
ALT: 17 U/L (ref 0–35)
AST: 31 U/L (ref 0–37)
Albumin: 3.8 g/dL (ref 3.5–5.2)
Alkaline Phosphatase: 87 U/L (ref 39–117)
Bilirubin, Direct: 0.2 mg/dL (ref 0.0–0.3)
Total Bilirubin: 0.6 mg/dL (ref 0.2–1.2)
Total Protein: 6.5 g/dL (ref 6.0–8.3)

## 2022-12-15 LAB — BRAIN NATRIURETIC PEPTIDE: Pro B Natriuretic peptide (BNP): 51 pg/mL (ref 0.0–100.0)

## 2022-12-15 MED ORDER — FUROSEMIDE 20 MG PO TABS: 20.0000 mg | ORAL_TABLET | Freq: Every day | ORAL | 0 refills | Status: DC

## 2022-12-15 NOTE — Patient Instructions (Addendum)
See your nephrologist on Monday  Ask them to check your potassium since you took lasix   If you want to try mounjaro at 10 mg one more time we can    Elevate feet  Watch salt intake  You can try wearing support socks to the knee   Labs today   Take lasix 20 mg daily for 3 days   Chart what you are eating  Follow up in about 2 weeks

## 2022-12-15 NOTE — Progress Notes (Unsigned)
Subjective:    Patient ID: Jamie [name], female    DOB: 03-21-1955, 67 y.o.   MRN: [id number]  HPI  Wt Readings from Last 3 Encounters:  12/15/22 266 lb (120.7 kg)  11/14/22 250 lb (113.4 kg)  10/12/22 249 lb 8 oz (113.2 kg)   44.26 kg/m  Vitals:   12/15/22 0757 12/15/22 0827  BP: (!) 154/78 (!) 141/80  Pulse: 77   Temp: 97.8 F (36.6 C)   SpO2: 98%     Pt presents with c/o swollen feet and legs   Going on at least a month  Consistently worse  Tries to elevate- sleeps in recliner and elevates feet with pillow   Gets up and they are worse  Better after walking   It is uncomfortable   Gained wright  ? Know why  She added glipizide back 1 tab in am  ? Glipizide 5 mg xl   Blood sugars suddenly went up - no reason at all   Mounjaro 7.5  Tried going on to 10 for a dose - ? Bloating   More shortness of breath on exertion  Had normal echo stress test in 2019     HTN bp is stable today  No cp or palpitations or headaches or edema  No side effects to medicines  BP Readings from Last 3 Encounters:  12/15/22 (!) 141/80  10/12/22 129/70  08/12/22 (!) 150/75   Losartan 25 mg daily  Metoprolol 25 mg daily   Lab Results  Component Value Date   NA 140 10/05/2022   K 3.9 10/05/2022   CO2 22 10/05/2022   GLUCOSE 126 (H) 10/05/2022   BUN 13 10/05/2022   CREATININE 1.20 10/05/2022   CALCIUM 9.0 10/05/2022   GFR 46.57 (L) 10/05/2022   GFRNONAA 38 (L) 08/12/2022   At nephrology  11/20 better at 48  PTH 31  Microalb ratio 5   Has increase her fluids  Watches out for sodium  Could not get cbc   Has appointment on Monday   Has hepatology visit jan 10  Lab Results  Component Value Date   LABPROT 14.5 05/11/2022       Lab Results  Component Value Date   ALT 17 12/15/2022   AST 31 12/15/2022   ALKPHOS 87 12/15/2022   BILITOT 0.6 12/15/2022     CT abd/pelv 4.2024 IMPRESSION: 1. No acute intra-abdominal pathology identified. No  definite radiographic explanation for the patient's reported symptoms. 2. Stable mild asymmetric left hydronephrosis, possibly related to the sequela of remote distension or underlying mild UPJ obstruction. 3. Minimal left nonobstructing nephrolithiasis. 4. Cirrhosis. 5. Surgical changes of a total proctocolectomy and right lower quadrant and ileostomy. No evidence of obstruction. 6. Acute to subacute fracture of the L1 vertebral body status post T11-L3 posterior thoracolumbar fusion with instrumentation. 7. Aortic atherosclerosis.  Aortic Atherosclerosis (ICD10-I70.0   UsIMPRESSION: Heterogeneous echotexture of hepatic parenchyma is noted with nodular hepatic contours consistent with hepatic cirrhosis. No definite focal sonographic hepatic abnormality is noted. Abnormality seen on prior CT scan is not visualized. MRI may be more sensitive for possible underlying neoplasm.   Status post cholecystectomy.  Was ref to [facility]   Patient Active Problem List   Diagnosis Date Noted   Diabetes mellitus treated with injections of non-insulin medication (HCC) 12/17/2022   Pedal edema 12/15/2022   Abnormal finding on imaging of liver 10/12/2022   Long-term current use of injectable noninsulin antidiabetic medication 10/12/2022   Panic anxiety syndrome  07/20/2022   Encounter for screening mammogram for breast cancer 07/11/2022   Estrogen deficiency 07/11/2022   Low serum vitamin B12 07/04/2022   Current use of proton pump inhibitor 07/03/2022   Low magnesium level 06/06/2022   Closed tricolumnar fracture of lumbar vertebra (HCC) 05/12/2022   Compression fracture of L1 lumbar vertebra (HCC) 05/11/2022   Morbid obesity (HCC) 05/11/2022   Fall at home, initial encounter 05/11/2022   Chronic kidney disease, stage 3a (HCC) 05/11/2022   Closed fracture of first lumbar vertebra (HCC) 05/11/2022   Closed fracture of twelfth thoracic vertebra (HCC) 05/11/2022   Spinal instability,  lumbar 05/11/2022   Hearing loss, right 04/07/2022   Sinus congestion 04/07/2022   Abnormal SPEP 04/28/2021   Abnormal EKG 04/26/2021   CKD (chronic kidney disease) stage 3, GFR 30-59 ml/min (HCC) 05/31/2020   Aortic atherosclerosis (HCC) 03/01/2020   Elevated serum creatinine 05/14/2019   Dermatitis 07/01/2018   Left shoulder pain 11/30/2017   Palpitations 07/05/2017   Lichen planus 04/09/2017   Hyperlipidemia associated with type 2 diabetes mellitus (HCC) 04/10/2016   UTI (urinary tract infection) 10/02/2014   Red blood cell antibody positive 07/02/2013   Elevated C-reactive protein (CRP) 05/15/2012   Routine general medical examination at a health care facility 09/24/2011   Apnea 11/14/2010   Stress reaction, emotional 05/02/2010   Compulsive overeater 05/02/2010   Arthritis    H/O small bowel obstruction    KNEE PAIN 10/14/2009   DM (diabetes mellitus), type 2 with renal complications (HCC) 04/23/2009   ROSACEA 09/12/2007   Essential hypertension 05/15/2007   OSA (obstructive sleep apnea) 12/13/2006   Ulcerative colitis (HCC) 07/12/2006   Past Medical History:  Diagnosis Date   Allergy 1970s   Sulfa drugs   Anemia    Arthritis    generalized   Blood transfusion without reported diagnosis 1979   Bowel obstruction (HCC)    repeated (? possible adhesions) neg EGD   Cataract ~2018   Not ready for surgery yet   Chronic kidney disease    Colitis    with colectomy   Diabetes mellitus without complication (HCC)    Dyspnea    covid   Gall stones 01/23/2006   Gastroenteritis 07/25/2014   hosp for IVF    GERD (gastroesophageal reflux disease)    History of kidney stones    HTN (hypertension)    Hyperglycemia    mild-monitors A1C   Obesity    Pneumonia    Rosacea    Sleep apnea    CPAP everynight   Past Surgical History:  Procedure Laterality Date   APPENDECTOMY  08-1991   Along with colon removal   CHOLECYSTECTOMY     COLECTOMY  1993   has ileostomy   COLON  SURGERY  2021   removed rectum   COLONOSCOPY  08/1991   COLOSTOMY REVISION  12/06/2011   Procedure: COLOSTOMY REVISION;  Surgeon: Romie Levee, MD;  Location: [facility];  Service: General;  Laterality: Right;  OSTOMY revision   CYSTOSCOPY/URETEROSCOPY/HOLMIUM LASER/STENT PLACEMENT Left 04/11/2021   Procedure: CYSTOSCOPY/URETEROSCOPY/HOLMIUM LASER/STENT PLACEMENT;  Surgeon: Vanna Scotland, MD;  Location: [facility];  Service: Urology;  Laterality: Left;   EXAMINATION UNDER ANESTHESIA  12/06/2011   Procedure: EXAM UNDER ANESTHESIA;  Surgeon: Romie Levee, MD;  Location: [facility];  Service: General;  Laterality: N/A;  rectal exam under anesthesia, anal dilation, pouchoscopy     SPINE SURGERY  05/12/2022   2 fractured vertebra   TOTAL ABDOMINAL HYSTERECTOMY  05/2006   for abscessed ovaries   Social History   Tobacco Use   Smoking status: Never   Smokeless tobacco: Never  Vaping Use   Vaping status: Never Used  Substance Use Topics   Alcohol use: No   Drug use: Never   Family History  Problem Relation Age of Onset   Hypertension Mother    Hyperlipidemia Mother    Cirrhosis Mother        NASH   Diabetes Mother    Liver cancer Father        resection secondary to mets   Cancer Father        colon   Hyperlipidemia Father    Hypertension Father    Allergies Father    Ulcerative colitis Father    Arthritis Father    Allergies Brother    Gastric cancer Brother    Cancer Brother    Breast cancer Paternal Grandmother    Breast cancer Cousin 64       paternal cousins   ADD / ADHD Son    Allergies  Allergen Reactions   Ciprofloxacin Rash    Erythema and pruritis around IV site, erythema and rash along vein   Other Rash   Jardiance [Empagliflozin]     Increase in Cr    Lisinopril     REACTION: felt bad/ stomach hurt/ weak and tired   Metformin And Related     GI   Sulfa Antibiotics Rash   Current Outpatient Medications on File Prior to  Visit  Medication Sig Dispense Refill   ACCU-CHEK GUIDE test strip TO CHECK GLUCOSE DAILY AND AS NEEDED FOR TYPE 2 DIABETES 100 strip 1   acetaminophen (TYLENOL) 325 MG tablet Take 650 mg by mouth every 6 (six) hours as needed for moderate pain.     atorvastatin (LIPITOR) 10 MG tablet TAKE 1/2 PILL BY MOUTH EVERY OTHER DAY 24 tablet 1   Blood Glucose Monitoring Suppl (ACCU-CHEK GUIDE) w/Device KIT To check glucose daily and as needed for type 2 diabetes 1 kit 0   cephALEXin (KEFLEX) 250 MG capsule Take 1 capsule (250 mg total) by mouth daily. 90 capsule 0   Cholecalciferol (VITAMIN D3) 25 MCG (1000 UT) CAPS Take 1,000 Units by mouth daily.     Continuous Glucose Sensor (FREESTYLE LIBRE 3 SENSOR) MISC Use as directed for diabetes type 2 requiring insulin 6 each 3   Cyanocobalamin (B-12) 1000 MCG TABS Take 1 tablet by mouth daily.     esomeprazole (NEXIUM) 20 MG capsule Take 20 mg by mouth every other day.     fluticasone (FLONASE) 50 MCG/ACT nasal spray Place 2 sprays into both nostrils at bedtime.     Lancets (ACCU-CHEK MULTICLIX) lancets To check glucose daily and as needed for diabetes type 2 100 each 3   losartan (COZAAR) 25 MG tablet Take 25 mg by mouth daily.     metoprolol tartrate (LOPRESSOR) 25 MG tablet TAKE 1 TABLET BY MOUTH EVERY DAY 90 tablet 0   Multiple Vitamin (MULTIVITAMIN) tablet Take 1 tablet by mouth daily.     NON FORMULARY SLEEPS WITH BI-PAP     phenazopyridine (PYRIDIUM) 200 MG tablet Take 1 tablet (200 mg total) by mouth 3 (three) times daily as needed for pain. 10 tablet 0   simethicone (MYLICON) 80 MG chewable tablet Chew 1 tablet (80 mg total) by mouth 4 (four) times daily as needed for flatulence. 30 tablet 0   tirzepatide (MOUNJARO) 7.5 MG/0.5ML Pen INJECT 7.5  MG SUBCUTANEOUSLY WEEKLY 6 mL 1   zolpidem (AMBIEN) 10 MG tablet Take 0.5 tablets (5 mg total) by mouth at bedtime as needed for sleep. Caution of sedation and falls 15 tablet 3   No current  facility-administered medications on file prior to visit.    Review of Systems  Constitutional:  Negative for activity change, appetite change, fatigue, fever and unexpected weight change.  HENT:  Negative for congestion, ear pain, rhinorrhea, sinus pressure and sore throat.   Eyes:  Negative for pain, redness and visual disturbance.  Respiratory:  Negative for cough, shortness of breath and wheezing.   Cardiovascular:  Positive for leg swelling. Negative for chest pain and palpitations.  Gastrointestinal:  Negative for abdominal pain, blood in stool, constipation and diarrhea.  Endocrine: Negative for polydipsia and polyuria.  Genitourinary:  Negative for dysuria, frequency and urgency.  Musculoskeletal:  Negative for arthralgias, back pain and myalgias.  Skin:  Negative for pallor and rash.  Allergic/Immunologic: Negative for environmental allergies.  Neurological:  Negative for dizziness, syncope and headaches.  Hematological:  Negative for adenopathy. Does not bruise/bleed easily.  Psychiatric/Behavioral:  Negative for decreased concentration and dysphoric mood. The patient is not nervous/anxious.        Objective:   Physical Exam Constitutional:      General: She is not in acute distress.    Appearance: Normal appearance. She is well-developed. She is obese. She is not ill-appearing or diaphoretic.  HENT:     Head: Normocephalic and atraumatic.  Eyes:     Conjunctiva/sclera: Conjunctivae normal.     Pupils: Pupils are equal, round, and reactive to light.  Neck:     Thyroid: No thyromegaly.     Vascular: No carotid bruit or JVD.  Cardiovascular:     Rate and Rhythm: Normal rate and regular rhythm.     Heart sounds: Normal heart sounds.     No gallop.  Pulmonary:     Effort: Pulmonary effort is normal. No respiratory distress.     Breath sounds: Normal breath sounds. No wheezing or rales.  Abdominal:     General: There is no distension or abdominal bruit.     Palpations:  Abdomen is soft.  Musculoskeletal:     Cervical back: Normal range of motion and neck supple.     Right lower leg: Edema present.     Left lower leg: Edema present.     Comments: One plus pedal edema bilaterally  Symmetric   No tenderness No palp cord  Neg homan's test    Lymphadenopathy:     Cervical: No cervical adenopathy.  Skin:    General: Skin is warm and dry.     Coloration: Skin is not pale.     Findings: No rash.  Neurological:     Mental Status: She is alert.     Coordination: Coordination normal.     Deep Tendon Reflexes: Reflexes are normal and symmetric. Reflexes normal.  Psychiatric:        Mood and Affect: Mood normal.           Assessment & Plan:   Problem List Items Addressed This Visit       Cardiovascular and Mediastinum   Essential hypertension    Blood pressure is up today in setting of pedal edema/discomfort and weight gain BP: (!) 141/80   Continues Losartan 25 mg daily  Metoprolol 25 mg daily   Has nephrology appointment Monday Will discuss there ? If would tolerate increase in losartan  Last GFR was 46.5 here       Relevant Medications   furosemide (LASIX) 20 MG tablet     Endocrine   DM (diabetes mellitus), type 2 with renal complications (HCC)    Pt has tolerated mounjaro well and wants to go up to 10 mg weekly Lab Results  Component Value Date   HGBA1C 6.9 (H) 10/05/2022   Weight is up recently after adding back glipizide xl 5 mg daily  Discussed need for low glycemic diet and exercise as tolerated   On arb and statin       Diabetes mellitus treated with injections of non-insulin medication (HCC)    Mounjaro 7.5 Plans to increase to 10 and let us know if tolerated         Other   Pedal edema - Primary    Likely multifactorial  Is uncomfortable Weight gain Possible venous insuff  Takes metoprolol  Has CKD Also some cirrhosis -pending hepatology visit   Lab today incl chem panel and BNP   Trial of lasix 20  mg for 3 d  Lab and nephrology visit Monday as planned  Encouraged fluid intae Sodium avoidance Leg elevation/possibly compression hose       Relevant Orders   Brain natriuretic peptide (Completed)   CBC (Completed)   Hepatic function panel (Completed)

## 2022-12-17 DIAGNOSIS — Z7985 Long-term (current) use of injectable non-insulin antidiabetic drugs: Secondary | ICD-10-CM | POA: Insufficient documentation

## 2022-12-17 NOTE — Assessment & Plan Note (Signed)
Mounjaro 7.5 Plans to increase to 10 and let us know if tolerated

## 2022-12-17 NOTE — Assessment & Plan Note (Signed)
Pt has tolerated mounjaro well and wants to go up to 10 mg weekly Lab Results  Component Value Date   HGBA1C 6.9 (H) 10/05/2022   Weight is up recently after adding back glipizide xl 5 mg daily  Discussed need for low glycemic diet and exercise as tolerated   On arb and statin

## 2022-12-17 NOTE — Assessment & Plan Note (Signed)
Blood pressure is up today in setting of pedal edema/discomfort and weight gain BP: (!) 141/80   Continues Losartan 25 mg daily  Metoprolol 25 mg daily   Has nephrology appointment Monday Will discuss there ? If would tolerate increase in losartan  Last GFR was 46.5 here

## 2022-12-17 NOTE — Assessment & Plan Note (Addendum)
Likely multifactorial  Is uncomfortable Weight gain Possible venous insuff  Takes metoprolol  Has CKD Also some cirrhosis -pending hepatology visit   Lab today incl chem panel and BNP   Trial of lasix 20 mg for 3 d  Lab and nephrology visit Monday as planned  Encouraged fluid intae Sodium avoidance Leg elevation/possibly compression hose

## 2022-12-18 DIAGNOSIS — R609 Edema, unspecified: Secondary | ICD-10-CM | POA: Diagnosis not present

## 2022-12-18 DIAGNOSIS — I10 Essential (primary) hypertension: Secondary | ICD-10-CM | POA: Diagnosis not present

## 2022-12-18 DIAGNOSIS — E1122 Type 2 diabetes mellitus with diabetic chronic kidney disease: Secondary | ICD-10-CM | POA: Diagnosis not present

## 2022-12-18 DIAGNOSIS — N183 Chronic kidney disease, stage 3 unspecified: Secondary | ICD-10-CM | POA: Diagnosis not present

## 2022-12-18 DIAGNOSIS — N1831 Chronic kidney disease, stage 3a: Secondary | ICD-10-CM | POA: Diagnosis not present

## 2022-12-18 DIAGNOSIS — R809 Proteinuria, unspecified: Secondary | ICD-10-CM | POA: Diagnosis not present

## 2022-12-18 IMAGING — MG MM DIGITAL SCREENING BILAT W/ TOMO AND CAD
6 of 10 series · 6 of 30 positions shown · non-contrast
Comparison: Previous exam(s).

CLINICAL DATA: Screening.

EXAM:
DIGITAL SCREENING BILATERAL MAMMOGRAM WITH TOMOSYNTHESIS AND CAD
TECHNIQUE: Bilateral screening digital craniocaudal and mediolateral oblique
mammograms were obtained. Bilateral screening digital breast
tomosynthesis was performed. The images were evaluated with
computer-aided detection.

[R CC synth-2D]
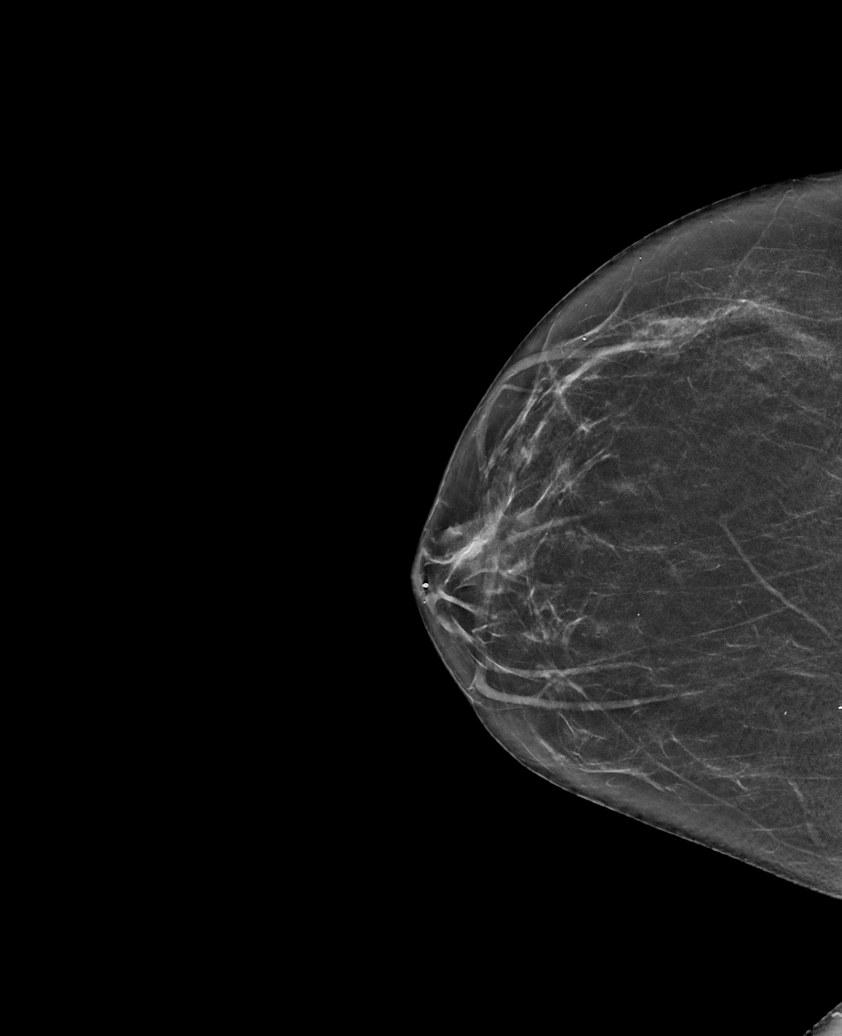

[L MLO synth-2D]
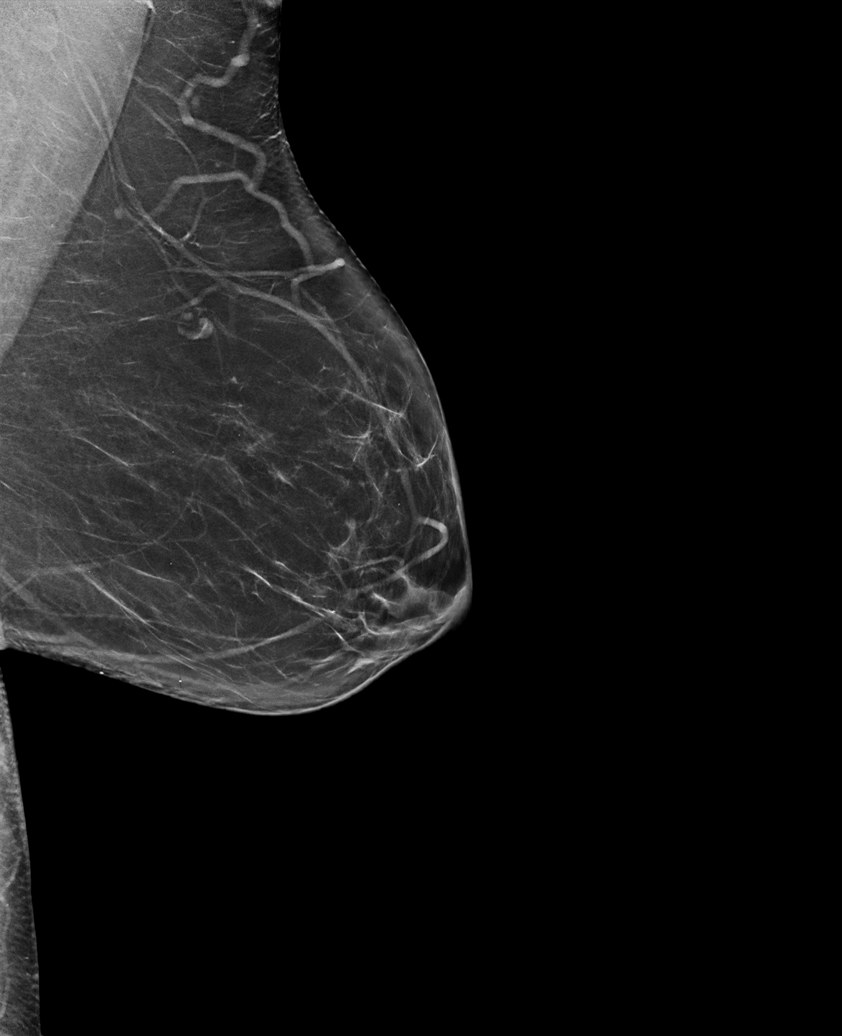

[R MLO synth-2D]
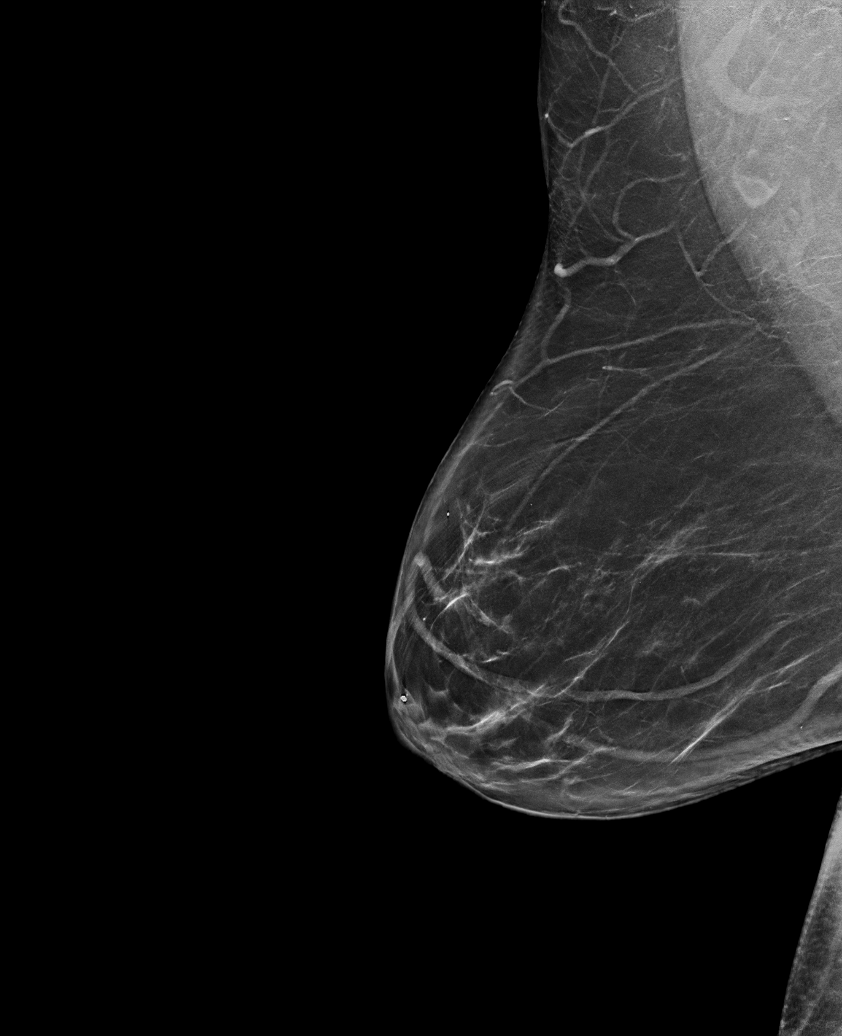

[L CC synth-2D (1 of 2)]
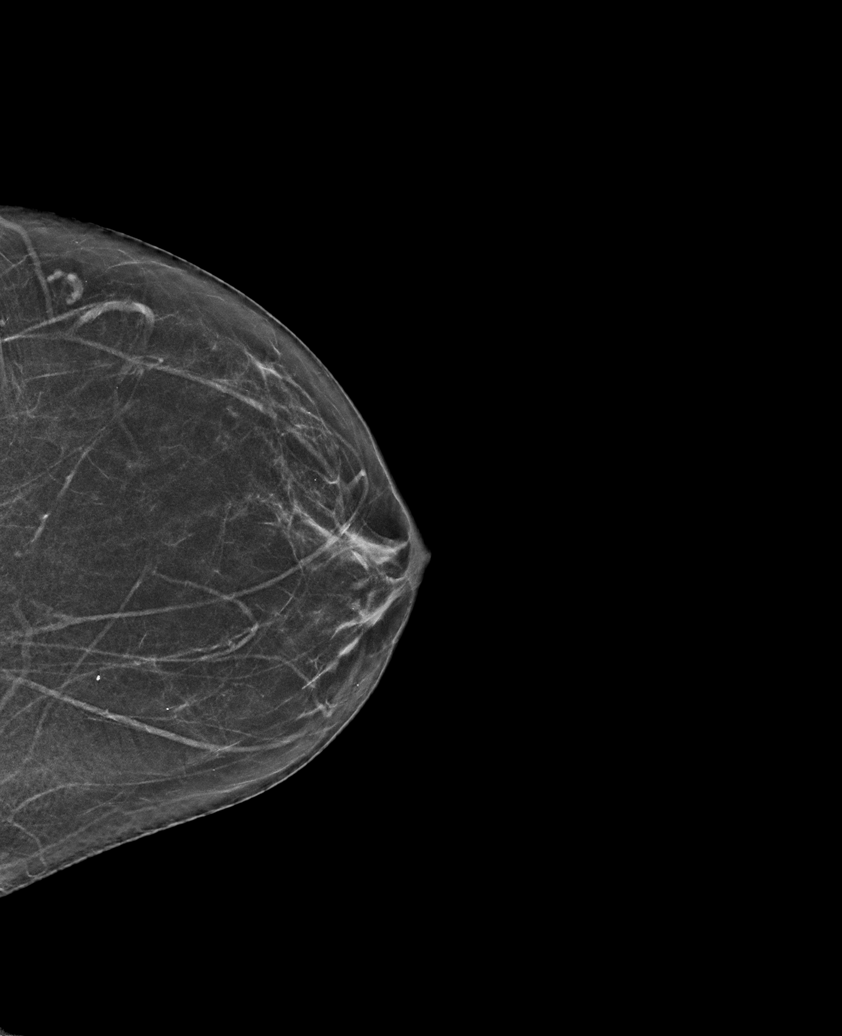

[L CC synth-2D (2 of 2)]
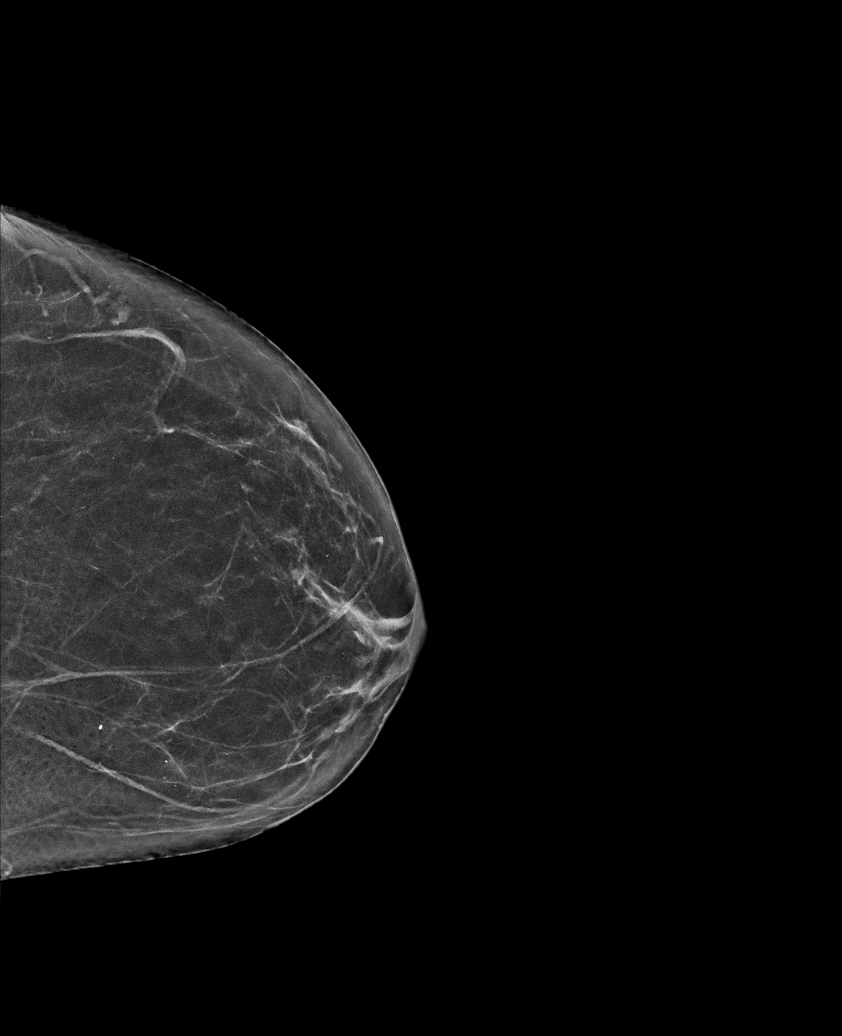

[R CC tomo · tomo slice 29/57.0]
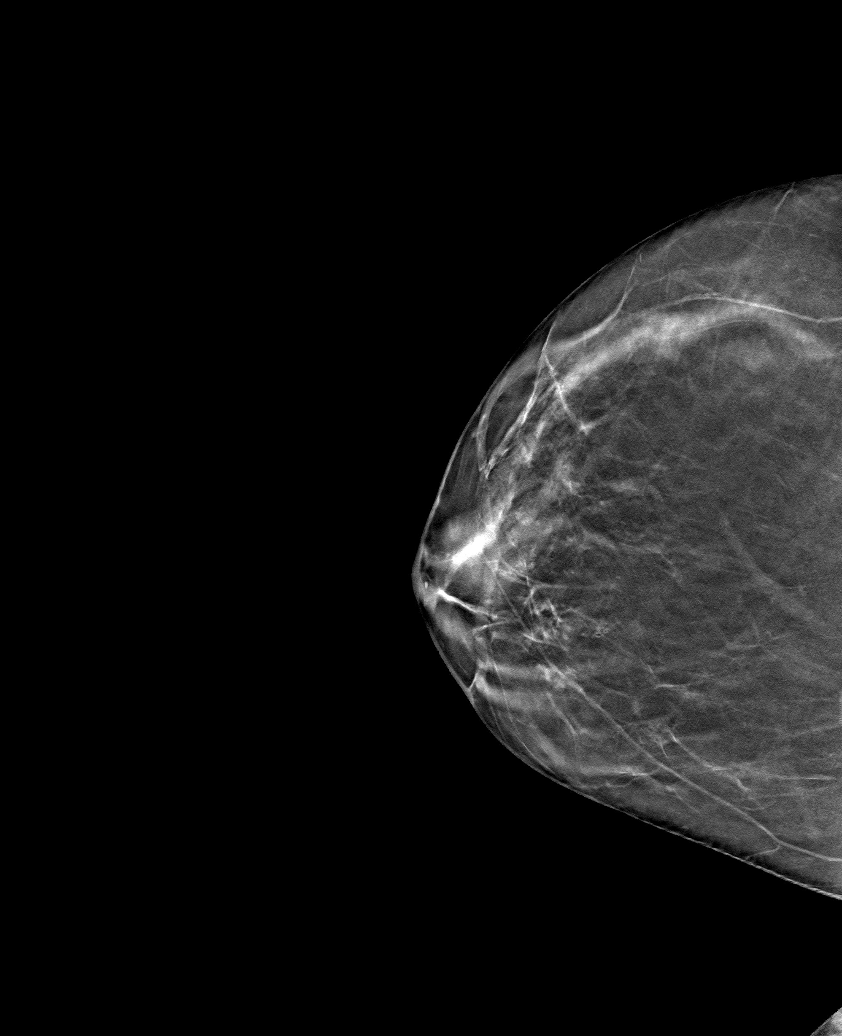

[6 of 30 positions shown; findings below may reference images not displayed]

ACR Breast Density Category b: There are scattered areas of
fibroglandular density.
FINDINGS: There are no findings suspicious for malignancy.
IMPRESSION: No mammographic evidence of malignancy. A result letter of this
screening mammogram will be mailed directly to the patient.

RECOMMENDATION:
Screening mammogram in one year. (Code:51-O-LD2)

BI-RADS CATEGORY  1: Negative.

## 2022-12-19 ENCOUNTER — Encounter: Payer: Self-pay | Admitting: Family Medicine

## 2022-12-25 NOTE — Progress Notes (Unsigned)
Subjective:    Patient ID: Jamie [name], female    DOB: 06-Dec-1955, 67 y.o.   MRN: [id number]  HPI  Wt Readings from Last 3 Encounters:  12/26/22 263 lb 6 oz (119.5 kg)  12/15/22 266 lb (120.7 kg)  11/14/22 250 lb (113.4 kg)   43.83 kg/m  Vitals:   12/26/22 1356  BP: 136/62  Pulse: 75  Temp: 97.6 F (36.4 C)  SpO2: 99%    Pt presents for follow up of chronic conditions including HTN , pedal edema , dm2   Pedal edema  Noted at last visit   Likely multifactorial  Is uncomfortable Weight gain Possible venous insuff  Takes metoprolol  Has CKD Also some cirrhosis -pending hepatology visit    Lab Results  Component Value Date   NA 140 10/05/2022   K 3.9 10/05/2022   CO2 22 10/05/2022   GLUCOSE 126 (H) 10/05/2022   BUN 13 10/05/2022   CREATININE 1.20 10/05/2022   CALCIUM 9.0 10/05/2022   GFR 46.57 (L) 10/05/2022   GFRNONAA 38 (L) 08/12/2022   Given lasix 20 mg daily for 3 days  Seen by nephrology  Recommended consideration of 2d echo since last one was in 2016 (EF was 65%) Echo stress was normal in 2019  Recommended continued diuresis and tried to increase dose to 40 daily  Pt takes 20 mg bid (prefers this and is able to tolerate at night)   Swelling is 90 percent better   Lab Results  Component Value Date   ALT 17 12/15/2022   AST 31 12/15/2022   ALKPHOS 87 12/15/2022   BILITOT 0.6 12/15/2022   Waiting on her echocardiogram - waiting for a date   Has been shortness of breath (no improvement with less swelling)   Just during exertion  Her HR goes up higher than expected with activity (in 120s)  No chest pain or pressure   Is out of shape Does not exercise due to "laziness"  Has tried to increase steps occasionally  Walking makes her back hurt     HTN bp is stable today  No cp or palpitations or headaches or edema  No side effects to medicines  BP Readings from Last 3 Encounters:  12/26/22 136/62  12/15/22 (!) 141/80  10/12/22  129/70    Losartan 25 mg daily  Metoprolol 25 mg daily   Pulse Readings from Last 3 Encounters:  12/26/22 75  12/15/22 77  10/12/22 72   She can hear heart beat in her head when active    DM2 Lab Results  Component Value Date   HGBA1C 7.3 (A) 12/26/2022   Weight went up after adding back glipizide xl 5 mg - thinks it did help glucose  We discussed increase mounjaro to 10 mg next time      Lab Results  Component Value Date   HGBA1C 7.3 (A) 12/26/2022    Pt is not surprised  Diet was bad with holiday but prior to that really working on eating right and portion control   Lab Results  Component Value Date   LABMICR See below: 11/14/2022   LABMICR See below: 08/02/2022   MICROALBUR 3.8 (H) 12/13/2020   MICROALBUR 10.5 (H) 05/08/2019       Eye exam is planned this Thursday  Considering cataract surgery also   Last labs on 11/25 K 4.1 Cr 1.34   Still taking B12 1000 mcg  Lab Results  Component Value Date   VITAMINB12 319 10/05/2022  Patient Active Problem List   Diagnosis Date Noted   Diabetes mellitus treated with injections of non-insulin medication (HCC) 12/17/2022   Pedal edema 12/15/2022   Abnormal finding on imaging of liver 10/12/2022   Long-term current use of injectable noninsulin antidiabetic medication 10/12/2022   Panic anxiety syndrome 07/20/2022   Encounter for screening mammogram for breast cancer 07/11/2022   Estrogen deficiency 07/11/2022   Low serum vitamin B12 07/04/2022   Current use of proton pump inhibitor 07/03/2022   Low magnesium level 06/06/2022   Closed tricolumnar fracture of lumbar vertebra (HCC) 05/12/2022   Compression fracture of L1 lumbar vertebra (HCC) 05/11/2022   Morbid obesity (HCC) 05/11/2022   Fall at home, initial encounter 05/11/2022   Chronic kidney disease, stage 3a (HCC) 05/11/2022   Closed fracture of first lumbar vertebra (HCC) 05/11/2022   Closed fracture of twelfth thoracic vertebra (HCC)  05/11/2022   Spinal instability, lumbar 05/11/2022   Hearing loss, right 04/07/2022   Sinus congestion 04/07/2022   Abnormal SPEP 04/28/2021   Abnormal EKG 04/26/2021   CKD (chronic kidney disease) stage 3, GFR 30-59 ml/min (HCC) 05/31/2020   Aortic atherosclerosis (HCC) 03/01/2020   Elevated serum creatinine 05/14/2019   Dermatitis 07/01/2018   Left shoulder pain 11/30/2017   Palpitations 07/05/2017   Lichen planus 04/09/2017   Hyperlipidemia associated with type 2 diabetes mellitus (HCC) 04/10/2016   UTI (urinary tract infection) 10/02/2014   Red blood cell antibody positive 07/02/2013   Elevated C-reactive protein (CRP) 05/15/2012   Routine general medical examination at a health care facility 09/24/2011   Apnea 11/14/2010   Stress reaction, emotional 05/02/2010   Compulsive overeater 05/02/2010   Arthritis    H/O small bowel obstruction    KNEE PAIN 10/14/2009   DM (diabetes mellitus), type 2 with renal complications (HCC) 04/23/2009   ROSACEA 09/12/2007   Essential hypertension 05/15/2007   OSA (obstructive sleep apnea) 12/13/2006   Ulcerative colitis (HCC) 07/12/2006   Past Medical History:  Diagnosis Date   Allergy 1970s   Sulfa drugs   Anemia    Arthritis    generalized   Blood transfusion without reported diagnosis 1979   Bowel obstruction (HCC)    repeated (? possible adhesions) neg EGD   Cataract ~2018   Not ready for surgery yet   Chronic kidney disease    Colitis    with colectomy   Diabetes mellitus without complication (HCC)    Dyspnea    covid   Gall stones 01/23/2006   Gastroenteritis 07/25/2014   hosp for IVF    GERD (gastroesophageal reflux disease)    History of kidney stones    HTN (hypertension)    Hyperglycemia    mild-monitors A1C   Obesity    Pneumonia    Rosacea    Sleep apnea    CPAP everynight   Past Surgical History:  Procedure Laterality Date   APPENDECTOMY  08-1991   Along with colon removal   CHOLECYSTECTOMY      COLECTOMY  1993   has ileostomy   COLON SURGERY  2021   removed rectum   COLONOSCOPY  08/1991   COLOSTOMY REVISION  12/06/2011   Procedure: COLOSTOMY REVISION;  Surgeon: Romie Levee, MD;  Location: [facility];  Service: General;  Laterality: Right;  OSTOMY revision   CYSTOSCOPY/URETEROSCOPY/HOLMIUM LASER/STENT PLACEMENT Left 04/11/2021   Procedure: CYSTOSCOPY/URETEROSCOPY/HOLMIUM LASER/STENT PLACEMENT;  Surgeon: Vanna Scotland, MD;  Location: [facility];  Service: Urology;  Laterality: Left;   EXAMINATION UNDER ANESTHESIA  12/06/2011   Procedure: EXAM UNDER ANESTHESIA;  Surgeon: Romie Levee, MD;  Location: [facility];  Service: General;  Laterality: N/A;  rectal exam under anesthesia, anal dilation, pouchoscopy     SPINE SURGERY  05/12/2022   2 fractured vertebra   TOTAL ABDOMINAL HYSTERECTOMY  05/2006   for abscessed ovaries   Social History   Tobacco Use   Smoking status: Never   Smokeless tobacco: Never  Vaping Use   Vaping status: Never Used  Substance Use Topics   Alcohol use: No   Drug use: Never   Family History  Problem Relation Age of Onset   Hypertension Mother    Hyperlipidemia Mother    Cirrhosis Mother        NASH   Diabetes Mother    Liver cancer Father        resection secondary to mets   Cancer Father        colon   Hyperlipidemia Father    Hypertension Father    Allergies Father    Ulcerative colitis Father    Arthritis Father    Allergies Brother    Gastric cancer Brother    Cancer Brother    Breast cancer Paternal Grandmother    Breast cancer Cousin 26       paternal cousins   ADD / ADHD Son    Allergies  Allergen Reactions   Ciprofloxacin Rash    Erythema and pruritis around IV site, erythema and rash along vein   Other Rash   Jardiance [Empagliflozin]     Increase in Cr    Lisinopril     REACTION: felt bad/ stomach hurt/ weak and tired   Metformin And Related     GI   Sulfa Antibiotics Rash    Current Outpatient Medications on File Prior to Visit  Medication Sig Dispense Refill   ACCU-CHEK GUIDE test strip TO CHECK GLUCOSE DAILY AND AS NEEDED FOR TYPE 2 DIABETES 100 strip 1   acetaminophen (TYLENOL) 325 MG tablet Take 650 mg by mouth every 6 (six) hours as needed for moderate pain.     atorvastatin (LIPITOR) 10 MG tablet TAKE 1/2 PILL BY MOUTH EVERY OTHER DAY 24 tablet 1   Blood Glucose Monitoring Suppl (ACCU-CHEK GUIDE) w/Device KIT To check glucose daily and as needed for type 2 diabetes 1 kit 0   cephALEXin (KEFLEX) 250 MG capsule Take 1 capsule (250 mg total) by mouth daily. 90 capsule 0   Cholecalciferol (VITAMIN D3) 25 MCG (1000 UT) CAPS Take 1,000 Units by mouth daily.     Cyanocobalamin (B-12) 1000 MCG TABS Take 1 tablet by mouth daily.     esomeprazole (NEXIUM) 20 MG capsule Take 20 mg by mouth every other day.     fluticasone (FLONASE) 50 MCG/ACT nasal spray Place 2 sprays into both nostrils at bedtime.     Lancets (ACCU-CHEK MULTICLIX) lancets To check glucose daily and as needed for diabetes type 2 100 each 3   losartan (COZAAR) 25 MG tablet Take 25 mg by mouth daily.     metoprolol tartrate (LOPRESSOR) 25 MG tablet TAKE 1 TABLET BY MOUTH EVERY DAY 90 tablet 0   Multiple Vitamin (MULTIVITAMIN) tablet Take 1 tablet by mouth daily.     NON FORMULARY SLEEPS WITH BI-PAP     phenazopyridine (PYRIDIUM) 200 MG tablet Take 1 tablet (200 mg total) by mouth 3 (three) times daily as needed for pain. 10 tablet 0   simethicone (MYLICON) 80 MG chewable  tablet Chew 1 tablet (80 mg total) by mouth 4 (four) times daily as needed for flatulence. 30 tablet 0   zolpidem (AMBIEN) 10 MG tablet Take 0.5 tablets (5 mg total) by mouth at bedtime as needed for sleep. Caution of sedation and falls 15 tablet 3   furosemide (LASIX) 20 MG tablet Take 1 tablet (20 mg total) by mouth daily for 3 days. (Patient taking differently: Take 20 mg by mouth 2 (two) times daily.) 3 tablet 0   No current  facility-administered medications on file prior to visit.    Review of Systems  Constitutional:  Positive for fatigue. Negative for activity change, appetite change, fever and unexpected weight change.       Poor exercise tolerance   HENT:  Negative for congestion, ear pain, rhinorrhea, sinus pressure and sore throat.   Eyes:  Negative for pain, redness and visual disturbance.  Respiratory:  Positive for shortness of breath. Negative for cough and wheezing.   Cardiovascular:  Negative for chest pain and palpitations.  Gastrointestinal:  Negative for abdominal pain, blood in stool, constipation and diarrhea.  Endocrine: Negative for polydipsia and polyuria.  Genitourinary:  Negative for dysuria, frequency and urgency.  Musculoskeletal:  Negative for arthralgias, back pain and myalgias.  Skin:  Negative for pallor and rash.  Allergic/Immunologic: Negative for environmental allergies.  Neurological:  Negative for dizziness, syncope and headaches.  Hematological:  Negative for adenopathy. Does not bruise/bleed easily.  Psychiatric/Behavioral:  Negative for decreased concentration and dysphoric mood. The patient is not nervous/anxious.        Objective:   Physical Exam Constitutional:      General: She is not in acute distress.    Appearance: Normal appearance. She is well-developed. She is obese. She is not ill-appearing or diaphoretic.  HENT:     Head: Normocephalic and atraumatic.     Mouth/Throat:     Mouth: Mucous membranes are moist.  Eyes:     Conjunctiva/sclera: Conjunctivae normal.     Pupils: Pupils are equal, round, and reactive to light.  Neck:     Thyroid: No thyromegaly.     Vascular: No carotid bruit or JVD.  Cardiovascular:     Rate and Rhythm: Normal rate and regular rhythm.     Heart sounds: Normal heart sounds.     No gallop.  Pulmonary:     Effort: Pulmonary effort is normal. No respiratory distress.     Breath sounds: Normal breath sounds. No wheezing or  rales.  Abdominal:     General: There is no distension or abdominal bruit.     Palpations: Abdomen is soft.  Musculoskeletal:     Cervical back: Normal range of motion and neck supple.     Right lower leg: No edema.     Left lower leg: No edema.  Lymphadenopathy:     Cervical: No cervical adenopathy.  Skin:    General: Skin is warm and dry.     Coloration: Skin is not pale.     Findings: No rash.  Neurological:     Mental Status: She is alert.     Coordination: Coordination normal.     Deep Tendon Reflexes: Reflexes are normal and symmetric. Reflexes normal.  Psychiatric:        Mood and Affect: Mood normal.           Assessment & Plan:   Problem List Items Addressed This Visit       Cardiovascular and Mediastinum   Essential hypertension  bp in fair control at this time  BP Readings from Last 1 Encounters:  12/26/22 136/62   No changes needed Most recent labs reviewed  Disc lifstyle change with low sodium diet and exercise  Losartan 25 mg daily  Metoprolol 25 mg daily  Lasix 20 mg bid   Lab today  Reviewed last nephrology note       Relevant Orders   Basic metabolic panel     Endocrine   DM (diabetes mellitus), type 2 with renal complications (HCC) - Primary    See a/p for DM with inj of non insulin med Seeing nephrology       Relevant Medications   tirzepatide (MOUNJARO) 10 MG/0.5ML Pen   Other Relevant Orders   POCT HgB A1C (Completed)   Diabetes mellitus treated with injections of non-insulin medication (HCC)    Lab Results  Component Value Date   HGBA1C 7.3 (A) 12/26/2022   Taking moujaro 7.5 mg weekly -will go up to 10 mg weekly and follow up in 3 mo  Glipizide xl 5 mg daily  No low readings  Working on diet  Sees nephrology for ckd /nephropathy  Eye exam planned thursday       Relevant Medications   tirzepatide [facility]) 10 MG/0.5ML Pen     Genitourinary   CKD (chronic kidney disease) stage 3, GFR 30-59 ml/min (HCC)     Reviewed last nephrology note Started on lasix for pedal edema  Cr was 1.43 with K of 4.1  Re check bmet today on lasix         Other   Pedal edema    Improved much with lasix 20 mg bid (pt prefers this to 40 mg daily) Under nephrology care BNP was reassuring Echocardiogram was ordered and pt is waiting on appointment  Has noted some shortness of breath on exertion (? If related to deconditioning)  Encouraged leg elevation and less processes food/sodium Bmp today      Relevant Orders   Basic metabolic panel   Morbid obesity (HCC)    Discussed how this problem influences overall health and the risks it imposes  Reviewed plan for weight loss with lower calorie diet (via better food choices (lower glycemic and portion control) along with exercise building up to or more than 30 minutes 5 days per week including some aerobic activity and strength training    Plan to increase mounjaro to 10 mg weekly      Relevant Medications   tirzepatide (MOUNJARO) 10 MG/0.5ML Pen

## 2022-12-26 ENCOUNTER — Ambulatory Visit (INDEPENDENT_AMBULATORY_CARE_PROVIDER_SITE_OTHER): Payer: Medicare PPO | Admitting: Family Medicine

## 2022-12-26 ENCOUNTER — Encounter: Payer: Self-pay | Admitting: Family Medicine

## 2022-12-26 VITALS — BP 136/62 | HR 75 | Temp 97.6°F | Ht 65.0 in | Wt 263.4 lb

## 2022-12-26 DIAGNOSIS — I10 Essential (primary) hypertension: Secondary | ICD-10-CM

## 2022-12-26 DIAGNOSIS — N1831 Chronic kidney disease, stage 3a: Secondary | ICD-10-CM

## 2022-12-26 DIAGNOSIS — E119 Type 2 diabetes mellitus without complications: Secondary | ICD-10-CM

## 2022-12-26 DIAGNOSIS — R600 Localized edema: Secondary | ICD-10-CM

## 2022-12-26 DIAGNOSIS — N184 Chronic kidney disease, stage 4 (severe): Secondary | ICD-10-CM

## 2022-12-26 DIAGNOSIS — Z7985 Long-term (current) use of injectable non-insulin antidiabetic drugs: Secondary | ICD-10-CM

## 2022-12-26 DIAGNOSIS — E1122 Type 2 diabetes mellitus with diabetic chronic kidney disease: Secondary | ICD-10-CM

## 2022-12-26 LAB — POCT GLYCOSYLATED HEMOGLOBIN (HGB A1C): Hemoglobin A1C: 7.3 % — AB (ref 4.0–5.6)

## 2022-12-26 MED ORDER — TIRZEPATIDE 10 MG/0.5ML ~~LOC~~ SOAJ: 10.0000 mg | SUBCUTANEOUS | 0 refills | Status: DC

## 2022-12-26 MED ORDER — FREESTYLE LIBRE 3 SENSOR MISC: 3 refills | Status: DC

## 2022-12-26 NOTE — Patient Instructions (Addendum)
Look at some chair yoga program  There are some on line also  Silver sneakers classes are wonderful as well   Labs today  Go up on mounjaro to 10 mg  In 3 weeks message me with how you are doing with that   Bmet today (potassium/renal function)   Get the echocardiogram as planned   Take care of yourself

## 2022-12-26 NOTE — Assessment & Plan Note (Signed)
Discussed how this problem influences overall health and the risks it imposes  Reviewed plan for weight loss with lower calorie diet (via better food choices (lower glycemic and portion control) along with exercise building up to or more than 30 minutes 5 days per week including some aerobic activity and strength training    Plan to increase mounjaro to 10 mg weekly

## 2022-12-26 NOTE — Assessment & Plan Note (Signed)
bp in fair control at this time  BP Readings from Last 1 Encounters:  12/26/22 136/62   No changes needed Most recent labs reviewed  Disc lifstyle change with low sodium diet and exercise  Losartan 25 mg daily  Metoprolol 25 mg daily  Lasix 20 mg bid   Lab today  Reviewed last nephrology note

## 2022-12-26 NOTE — Assessment & Plan Note (Signed)
Lab Results  Component Value Date   HGBA1C 7.3 (A) 12/26/2022   Taking moujaro 7.5 mg weekly -will go up to 10 mg weekly and follow up in 3 mo  Glipizide xl 5 mg daily  No low readings  Working on diet  Sees nephrology for ckd /nephropathy  Eye exam planned thursday

## 2022-12-26 NOTE — Assessment & Plan Note (Signed)
See a/p for DM with inj of non insulin med Seeing nephrology

## 2022-12-26 NOTE — Assessment & Plan Note (Signed)
Reviewed last nephrology note Started on lasix for pedal edema  Cr was 1.43 with K of 4.1  Re check bmet today on lasix

## 2022-12-26 NOTE — Assessment & Plan Note (Signed)
Improved much with lasix 20 mg bid (pt prefers this to 40 mg daily) Under nephrology care BNP was reassuring Echocardiogram was ordered and pt is waiting on appointment  Has noted some shortness of breath on exertion (? If related to deconditioning)  Encouraged leg elevation and less processes food/sodium Bmp today

## 2022-12-27 ENCOUNTER — Other Ambulatory Visit: Payer: Self-pay | Admitting: Nurse Practitioner

## 2022-12-27 DIAGNOSIS — N1831 Chronic kidney disease, stage 3a: Secondary | ICD-10-CM

## 2022-12-27 LAB — BASIC METABOLIC PANEL
BUN: 19 mg/dL (ref 6–23)
CO2: 25 meq/L (ref 19–32)
Calcium: 9.2 mg/dL (ref 8.4–10.5)
Chloride: 102 meq/L (ref 96–112)
Creatinine, Ser: 1.37 mg/dL — ABNORMAL HIGH (ref 0.40–1.20)
GFR: 39.66 mL/min — ABNORMAL LOW (ref >60.00)
Glucose, Bld: 227 mg/dL — ABNORMAL HIGH (ref 70–99)
Potassium: 3.8 meq/L (ref 3.5–5.1)
Sodium: 139 meq/L (ref 135–145)

## 2022-12-27 NOTE — Addendum Note (Signed)
Addended by: Roxy Manns A on: 12/27/2022 08:11 PM   Modules accepted: Orders

## 2022-12-28 DIAGNOSIS — H538 Other visual disturbances: Secondary | ICD-10-CM | POA: Diagnosis not present

## 2022-12-28 DIAGNOSIS — Z0100 Encounter for examination of eyes and vision without abnormal findings: Secondary | ICD-10-CM | POA: Diagnosis not present

## 2022-12-28 DIAGNOSIS — H2513 Age-related nuclear cataract, bilateral: Secondary | ICD-10-CM | POA: Diagnosis not present

## 2022-12-28 DIAGNOSIS — M3501 Sicca syndrome with keratoconjunctivitis: Secondary | ICD-10-CM | POA: Diagnosis not present

## 2022-12-28 DIAGNOSIS — E119 Type 2 diabetes mellitus without complications: Secondary | ICD-10-CM | POA: Diagnosis not present

## 2023-01-08 DIAGNOSIS — N1831 Chronic kidney disease, stage 3a: Secondary | ICD-10-CM | POA: Diagnosis not present

## 2023-01-08 DIAGNOSIS — R609 Edema, unspecified: Secondary | ICD-10-CM | POA: Diagnosis not present

## 2023-01-08 DIAGNOSIS — E1122 Type 2 diabetes mellitus with diabetic chronic kidney disease: Secondary | ICD-10-CM | POA: Diagnosis not present

## 2023-01-08 DIAGNOSIS — R809 Proteinuria, unspecified: Secondary | ICD-10-CM | POA: Diagnosis not present

## 2023-01-08 DIAGNOSIS — I10 Essential (primary) hypertension: Secondary | ICD-10-CM | POA: Diagnosis not present

## 2023-01-11 ENCOUNTER — Ambulatory Visit: Payer: Medicare PPO | Admitting: Family Medicine

## 2023-01-13 ENCOUNTER — Inpatient Hospital Stay
Admission: EM | Admit: 2023-01-13 | Discharge: 2023-01-16 | DRG: 683 | Disposition: A | Discharge status: Home / Self Care | Payer: Medicare PPO | Attending: Internal Medicine | Admitting: Internal Medicine

## 2023-01-13 ENCOUNTER — Encounter: Payer: Self-pay | Admitting: Pharmacy Technician

## 2023-01-13 DIAGNOSIS — N179 Acute kidney failure, unspecified: Principal | ICD-10-CM

## 2023-01-13 DIAGNOSIS — N183 Chronic kidney disease, stage 3 unspecified: Secondary | ICD-10-CM | POA: Diagnosis present

## 2023-01-13 DIAGNOSIS — Z792 Long term (current) use of antibiotics: Secondary | ICD-10-CM

## 2023-01-13 DIAGNOSIS — Z833 Family history of diabetes mellitus: Secondary | ICD-10-CM | POA: Diagnosis not present

## 2023-01-13 DIAGNOSIS — Z8261 Family history of arthritis: Secondary | ICD-10-CM

## 2023-01-13 DIAGNOSIS — K219 Gastro-esophageal reflux disease without esophagitis: Secondary | ICD-10-CM | POA: Diagnosis present

## 2023-01-13 DIAGNOSIS — A084 Viral intestinal infection, unspecified: Secondary | ICD-10-CM | POA: Diagnosis present

## 2023-01-13 DIAGNOSIS — I10 Essential (primary) hypertension: Secondary | ICD-10-CM | POA: Diagnosis present

## 2023-01-13 DIAGNOSIS — Z8249 Family history of ischemic heart disease and other diseases of the circulatory system: Secondary | ICD-10-CM

## 2023-01-13 DIAGNOSIS — E1122 Type 2 diabetes mellitus with diabetic chronic kidney disease: Secondary | ICD-10-CM | POA: Diagnosis not present

## 2023-01-13 DIAGNOSIS — Z9049 Acquired absence of other specified parts of digestive tract: Secondary | ICD-10-CM

## 2023-01-13 DIAGNOSIS — Z794 Long term (current) use of insulin: Secondary | ICD-10-CM | POA: Diagnosis not present

## 2023-01-13 DIAGNOSIS — N170 Acute kidney failure with tubular necrosis: Secondary | ICD-10-CM | POA: Diagnosis not present

## 2023-01-13 DIAGNOSIS — I129 Hypertensive chronic kidney disease with stage 1 through stage 4 chronic kidney disease, or unspecified chronic kidney disease: Secondary | ICD-10-CM | POA: Diagnosis present

## 2023-01-13 DIAGNOSIS — Z79899 Other long term (current) drug therapy: Secondary | ICD-10-CM | POA: Diagnosis not present

## 2023-01-13 DIAGNOSIS — E860 Dehydration: Secondary | ICD-10-CM | POA: Diagnosis not present

## 2023-01-13 DIAGNOSIS — E119 Type 2 diabetes mellitus without complications: Secondary | ICD-10-CM

## 2023-01-13 DIAGNOSIS — Z83438 Family history of other disorder of lipoprotein metabolism and other lipidemia: Secondary | ICD-10-CM | POA: Diagnosis not present

## 2023-01-13 DIAGNOSIS — Z888 Allergy status to other drugs, medicaments and biological substances status: Secondary | ICD-10-CM

## 2023-01-13 DIAGNOSIS — Z882 Allergy status to sulfonamides status: Secondary | ICD-10-CM

## 2023-01-13 DIAGNOSIS — Z7985 Long-term (current) use of injectable non-insulin antidiabetic drugs: Secondary | ICD-10-CM | POA: Diagnosis not present

## 2023-01-13 DIAGNOSIS — E876 Hypokalemia: Secondary | ICD-10-CM | POA: Diagnosis present

## 2023-01-13 DIAGNOSIS — R197 Diarrhea, unspecified: Secondary | ICD-10-CM | POA: Insufficient documentation

## 2023-01-13 DIAGNOSIS — I2510 Atherosclerotic heart disease of native coronary artery without angina pectoris: Secondary | ICD-10-CM | POA: Diagnosis present

## 2023-01-13 DIAGNOSIS — E872 Acidosis, unspecified: Secondary | ICD-10-CM | POA: Diagnosis present

## 2023-01-13 DIAGNOSIS — E871 Hypo-osmolality and hyponatremia: Secondary | ICD-10-CM | POA: Diagnosis not present

## 2023-01-13 DIAGNOSIS — K5190 Ulcerative colitis, unspecified, without complications: Secondary | ICD-10-CM | POA: Diagnosis present

## 2023-01-13 DIAGNOSIS — R198 Other specified symptoms and signs involving the digestive system and abdomen: Principal | ICD-10-CM

## 2023-01-13 DIAGNOSIS — E785 Hyperlipidemia, unspecified: Secondary | ICD-10-CM | POA: Diagnosis present

## 2023-01-13 LAB — URINALYSIS, ROUTINE W REFLEX MICROSCOPIC
Bilirubin Urine: NEGATIVE
Glucose, UA: NEGATIVE mg/dL
Ketones, ur: NEGATIVE mg/dL
Nitrite: NEGATIVE
Protein, ur: NEGATIVE mg/dL
Specific Gravity, Urine: 1.009 (ref 1.005–1.030)
pH: 5 (ref 5.0–8.0)

## 2023-01-13 LAB — CBC
HCT: 47.2 % — ABNORMAL HIGH (ref 36.0–46.0)
Hemoglobin: 15.4 g/dL — ABNORMAL HIGH (ref 12.0–15.0)
MCH: 30.9 pg (ref 26.0–34.0)
MCHC: 32.6 g/dL (ref 30.0–36.0)
MCV: 94.6 fL (ref 80.0–100.0)
Platelets: 244 10*3/uL (ref 150–400)
RBC: 4.99 MIL/uL (ref 3.87–5.11)
RDW: 14.1 % (ref 11.5–15.5)
WBC: 7.8 10*3/uL (ref 4.0–10.5)
nRBC: 0 % (ref 0.0–0.2)

## 2023-01-13 LAB — COMPREHENSIVE METABOLIC PANEL
ALT: 29 U/L (ref 0–44)
AST: 50 U/L — ABNORMAL HIGH (ref 15–41)
Albumin: 3.9 g/dL (ref 3.5–5.0)
Alkaline Phosphatase: 94 U/L (ref 38–126)
Anion gap: 15 (ref 5–15)
BUN: 24 mg/dL — ABNORMAL HIGH (ref 8–23)
CO2: 17 mmol/L — ABNORMAL LOW (ref 22–32)
Calcium: 9.5 mg/dL (ref 8.9–10.3)
Chloride: 97 mmol/L — ABNORMAL LOW (ref 98–111)
Creatinine, Ser: 1.99 mg/dL — ABNORMAL HIGH (ref 0.44–1.00)
GFR, Estimated: 27 mL/min — ABNORMAL LOW (ref >60)
Glucose, Bld: 269 mg/dL — ABNORMAL HIGH (ref 70–99)
Potassium: 4.3 mmol/L (ref 3.5–5.1)
Sodium: 129 mmol/L — ABNORMAL LOW (ref 135–145)
Total Bilirubin: 1.1 mg/dL (ref <1.2)
Total Protein: 8.2 g/dL — ABNORMAL HIGH (ref 6.5–8.1)

## 2023-01-13 LAB — TSH: TSH: 4.301 u[IU]/mL (ref 0.350–4.500)

## 2023-01-13 LAB — GASTROINTESTINAL PANEL BY PCR, STOOL (REPLACES STOOL CULTURE)

## 2023-01-13 LAB — C DIFFICILE QUICK SCREEN W PCR REFLEX
C Diff antigen: NEGATIVE
C Diff interpretation: NOT DETECTED
C Diff toxin: NEGATIVE

## 2023-01-13 LAB — CBG MONITORING, ED
Glucose-Capillary: 130 mg/dL — ABNORMAL HIGH (ref 70–99)
Glucose-Capillary: 179 mg/dL — ABNORMAL HIGH (ref 70–99)

## 2023-01-13 LAB — LIPASE, BLOOD: Lipase: 54 U/L — ABNORMAL HIGH (ref 11–51)

## 2023-01-13 MED ORDER — LACTATED RINGERS IV BOLUS
1000.0000 mL | Freq: Once | INTRAVENOUS | Status: AC
  Administered 2023-01-13: 1000 mL via INTRAVENOUS

## 2023-01-13 MED ORDER — ACETAMINOPHEN 325 MG PO TABS
650.0000 mg | ORAL_TABLET | Freq: Four times a day (QID) | ORAL | Status: DC | PRN
  Administered 2023-01-14 – 2023-01-15 (×3): 650 mg via ORAL
  Filled 2023-01-13 (×4): qty 2

## 2023-01-13 MED ORDER — RISAQUAD PO CAPS
2.0000 | ORAL_CAPSULE | Freq: Three times a day (TID) | ORAL | Status: DC
Start: 2023-01-13 — End: 2023-01-16
  Administered 2023-01-13 – 2023-01-16 (×9): 2 via ORAL
  Filled 2023-01-13 (×9): qty 2

## 2023-01-13 MED ORDER — TIRZEPATIDE 10 MG/0.5ML ~~LOC~~ SOAJ: 10.0000 mg | SUBCUTANEOUS | Status: DC

## 2023-01-13 MED ORDER — ATORVASTATIN CALCIUM 20 MG PO TABS
10.0000 mg | ORAL_TABLET | Freq: Every day | ORAL | Status: DC
  Administered 2023-01-14 – 2023-01-16 (×2): 10 mg via ORAL
  Filled 2023-01-13 (×3): qty 1

## 2023-01-13 MED ORDER — DIPHENOXYLATE-ATROPINE 2.5-0.025 MG PO TABS
1.0000 | ORAL_TABLET | Freq: Once | ORAL | Status: AC
  Administered 2023-01-13: 1 via ORAL
  Filled 2023-01-13: qty 1

## 2023-01-13 MED ORDER — SIMETHICONE 80 MG PO CHEW
80.0000 mg | CHEWABLE_TABLET | Freq: Four times a day (QID) | ORAL | Status: DC | PRN
  Administered 2023-01-14 – 2023-01-16 (×4): 80 mg via ORAL
  Filled 2023-01-13 (×7): qty 1

## 2023-01-13 MED ORDER — HYDRALAZINE HCL 50 MG PO TABS: 25.0000 mg | ORAL_TABLET | Freq: Four times a day (QID) | ORAL | Status: DC | PRN

## 2023-01-13 MED ORDER — FLUTICASONE PROPIONATE 50 MCG/ACT NA SUSP
2.0000 | Freq: Every day | NASAL | Status: DC
  Administered 2023-01-13 – 2023-01-15 (×3): 2 via NASAL
  Filled 2023-01-13: qty 16

## 2023-01-13 MED ORDER — ENOXAPARIN SODIUM 40 MG/0.4ML IJ SOSY
40.0000 mg | PREFILLED_SYRINGE | INTRAMUSCULAR | Status: DC
  Administered 2023-01-13 – 2023-01-15 (×3): 40 mg via SUBCUTANEOUS
  Filled 2023-01-13 (×3): qty 0.4

## 2023-01-13 MED ORDER — INSULIN ASPART 100 UNIT/ML IJ SOLN
0.0000 [IU] | Freq: Three times a day (TID) | INTRAMUSCULAR | Status: DC
  Administered 2023-01-13: 2 [IU] via SUBCUTANEOUS
  Administered 2023-01-14 (×2): 5 [IU] via SUBCUTANEOUS
  Administered 2023-01-14 – 2023-01-15 (×3): 3 [IU] via SUBCUTANEOUS
  Administered 2023-01-15: 5 [IU] via SUBCUTANEOUS
  Administered 2023-01-16: 2 [IU] via SUBCUTANEOUS
  Administered 2023-01-16: 5 [IU] via SUBCUTANEOUS
  Filled 2023-01-13 (×9): qty 1

## 2023-01-13 MED ORDER — SODIUM CHLORIDE 0.45 % IV SOLN
INTRAVENOUS | Status: AC
  Filled 2023-01-13 (×3): qty 75

## 2023-01-13 MED ORDER — ZOLPIDEM TARTRATE 5 MG PO TABS: 5.0000 mg | ORAL_TABLET | Freq: Every evening | ORAL | Status: DC | PRN

## 2023-01-13 MED ORDER — DIPHENOXYLATE-ATROPINE 2.5-0.025 MG PO TABS
1.0000 | ORAL_TABLET | Freq: Four times a day (QID) | ORAL | Status: DC | PRN
  Administered 2023-01-13 – 2023-01-14 (×3): 1 via ORAL
  Filled 2023-01-13 (×3): qty 1

## 2023-01-13 MED ORDER — INSULIN ASPART 100 UNIT/ML IJ SOLN: 0.0000 [IU] | Freq: Every day | INTRAMUSCULAR | Status: DC

## 2023-01-13 MED ORDER — PANTOPRAZOLE SODIUM 40 MG PO TBEC
40.0000 mg | DELAYED_RELEASE_TABLET | Freq: Every day | ORAL | Status: DC
  Administered 2023-01-14 – 2023-01-16 (×3): 40 mg via ORAL
  Filled 2023-01-13 (×3): qty 1

## 2023-01-13 MED ORDER — ONDANSETRON HCL 4 MG PO TABS: 4.0000 mg | ORAL_TABLET | Freq: Four times a day (QID) | ORAL | Status: DC | PRN

## 2023-01-13 MED ORDER — ONDANSETRON HCL 4 MG/2ML IJ SOLN: 4.0000 mg | Freq: Four times a day (QID) | INTRAMUSCULAR | Status: DC | PRN

## 2023-01-13 NOTE — H&P (Signed)
History and Physical    Jamie [name]:[id number] DOB: 1955-09-16 DOA: 01/13/2023  PCP: Judy Pimple, MD (Confirm with patient/family/NH records and if not entered, this has to be entered at [facility] point of entry) Patient coming from: Home  I have personally briefly reviewed patient's old medical records in [facility]  Chief Complaint: Diarrhea  HPI: Jamie [name] is a 67 y.o. female with medical history significant of ulcerative colitis status post total colectomy and ileostomy, HTN, HLD, IIDM, chronic recurrent UTI on suppressive antibiotic therapy, CKD stage II, presented with persistent diarrhea.  Symptoms started on Thursday night, patient started to pass watery diarrhea in the ileostomy bag, overnight she had to empty the ileostomy bag every 3-4 hours and then every 2-3 hours yesterday and continued to have high output diarrhea this morning, and decided to come to ED.  Denied any nauseous vomiting no abdominal pain no fever or chills.  She has been taking Imodium 3 times a day with no significant effect.  Patient does take Keflex once a day since June this year for UTI prophylaxis by urology.  But she denied any diarrhea before this week after she has been on the antibiotics.  Denied any other new medications.  Family member including her husband have no GI symptoms at all.  ED Course: Afebrile, borderline tachycardia blood pressure 130/60 nonhypoxic.  Blood work showed sodium 129, creatinine 1.9 compared to baseline 1.2-1.5 at her baseline, bicarb 17, K4.3 glucose 269.  WBC 7.8, hemoglobin 15.4.  Patient was given Lomotil p.o. x 1 in the ED  Review of Systems: As per HPI otherwise 14 point review of systems negative.    Past Medical History:  Diagnosis Date   Allergy 1970s   Sulfa drugs   Anemia    Arthritis    generalized   Blood transfusion without reported diagnosis 1979   Bowel obstruction (HCC)    repeated (? possible adhesions) neg EGD   Cataract ~2018   Not  ready for surgery yet   Chronic kidney disease    Colitis    with colectomy   Diabetes mellitus without complication (HCC)    Dyspnea    covid   Gall stones 01/23/2006   Gastroenteritis 07/25/2014   hosp for IVF    GERD (gastroesophageal reflux disease)    History of kidney stones    HTN (hypertension)    Hyperglycemia    mild-monitors A1C   Obesity    Pneumonia    Rosacea    Sleep apnea    CPAP everynight    Past Surgical History:  Procedure Laterality Date   APPENDECTOMY  08-1991   Along with colon removal   CHOLECYSTECTOMY     COLECTOMY  1993   has ileostomy   COLON SURGERY  2021   removed rectum   COLONOSCOPY  08/1991   COLOSTOMY REVISION  12/06/2011   Procedure: COLOSTOMY REVISION;  Surgeon: Romie Levee, MD;  Location: [facility];  Service: General;  Laterality: Right;  OSTOMY revision   CYSTOSCOPY/URETEROSCOPY/HOLMIUM LASER/STENT PLACEMENT Left 04/11/2021   Procedure: CYSTOSCOPY/URETEROSCOPY/HOLMIUM LASER/STENT PLACEMENT;  Surgeon: Vanna Scotland, MD;  Location: [facility];  Service: Urology;  Laterality: Left;   EXAMINATION UNDER ANESTHESIA  12/06/2011   Procedure: EXAM UNDER ANESTHESIA;  Surgeon: Romie Levee, MD;  Location: [facility];  Service: General;  Laterality: N/A;  rectal exam under anesthesia, anal dilation, pouchoscopy     SPINE SURGERY  05/12/2022   2 fractured vertebra  TOTAL ABDOMINAL HYSTERECTOMY  05/2006   for abscessed ovaries     reports that she has never smoked. She has never used smokeless tobacco. She reports that she does not drink alcohol and does not use drugs.  Allergies  Allergen Reactions   Ciprofloxacin Rash    Erythema and pruritis around IV site, erythema and rash along vein   Other Rash   Jardiance [Empagliflozin]     Increase in Cr    Lisinopril     REACTION: felt bad/ stomach hurt/ weak and tired   Metformin And Related     GI   Sulfa Antibiotics Rash    Family History  Problem  Relation Age of Onset   Hypertension Mother    Hyperlipidemia Mother    Cirrhosis Mother        NASH   Diabetes Mother    Liver cancer Father        resection secondary to mets   Cancer Father        colon   Hyperlipidemia Father    Hypertension Father    Allergies Father    Ulcerative colitis Father    Arthritis Father    Allergies Brother    Gastric cancer Brother    Cancer Brother    Breast cancer Paternal Grandmother    Breast cancer Cousin 84       paternal cousins   ADD / ADHD Son      Prior to Admission medications   Medication Sig Start Date End Date Taking? Authorizing Provider  ACCU-CHEK GUIDE test strip TO CHECK GLUCOSE DAILY AND AS NEEDED FOR TYPE 2 DIABETES 02/13/22   Tower, Audrie Gallus, MD  acetaminophen (TYLENOL) 325 MG tablet Take 650 mg by mouth every 6 (six) hours as needed for moderate pain.    [provider]  atorvastatin (LIPITOR) 10 MG tablet TAKE 1/2 PILL BY MOUTH EVERY OTHER DAY 11/16/22   Tower, Audrie Gallus, MD  Blood Glucose Monitoring Suppl (ACCU-CHEK GUIDE) w/Device KIT To check glucose daily and as needed for type 2 diabetes 06/26/19   Tower, Audrie Gallus, MD  cephALEXin (KEFLEX) 250 MG capsule Take 1 capsule (250 mg total) by mouth daily. 11/14/22   Vaillancourt, Lelon Mast, PA-C  Cholecalciferol (VITAMIN D3) 25 MCG (1000 UT) CAPS Take 1,000 Units by mouth daily.    [provider]  Continuous Glucose Sensor (FREESTYLE LIBRE 3 SENSOR) MISC Use as directed for diabetes type 2 requiring insulin 12/26/22   Tower, Audrie Gallus, MD  Cyanocobalamin (B-12) 1000 MCG TABS Take 1 tablet by mouth daily.    [provider]  esomeprazole (NEXIUM) 20 MG capsule Take 20 mg by mouth every other day.    [provider]  fluticasone (FLONASE) 50 MCG/ACT nasal spray Place 2 sprays into both nostrils at bedtime.    [provider]  furosemide (LASIX) 20 MG tablet Take 1 tablet (20 mg total) by mouth daily for 3 days. Patient taking differently:  Take 20 mg by mouth 2 (two) times daily. 12/15/22 12/18/22  Tower, Audrie Gallus, MD  Lancets (ACCU-CHEK MULTICLIX) lancets To check glucose daily and as needed for diabetes type 2 06/26/19   Tower, Audrie Gallus, MD  losartan (COZAAR) 25 MG tablet Take 25 mg by mouth daily. 02/15/21   [provider]  metoprolol tartrate (LOPRESSOR) 25 MG tablet TAKE 1 TABLET BY MOUTH EVERY DAY 11/13/22   Tower, Audrie Gallus, MD  Multiple Vitamin (MULTIVITAMIN) tablet Take 1 tablet by mouth daily.  [provider]  NON FORMULARY SLEEPS WITH BI-PAP    [provider]  phenazopyridine (PYRIDIUM) 200 MG tablet Take 1 tablet (200 mg total) by mouth 3 (three) times daily as needed for pain. 08/02/22   Vaillancourt, Lelon Mast, PA-C  simethicone (MYLICON) 80 MG chewable tablet Chew 1 tablet (80 mg total) by mouth 4 (four) times daily as needed for flatulence. 05/24/22   Pennie Banter, DO  tirzepatide [facility]) 10 MG/0.5ML Pen Inject 10 mg into the skin once a week. 12/26/22   Tower, Audrie Gallus, MD  zolpidem (AMBIEN) 10 MG tablet Take 0.5 tablets (5 mg total) by mouth at bedtime as needed for sleep. Caution of sedation and falls 07/11/22   Judy Pimple, MD    Physical Exam: Vitals:   01/13/23 1315  BP: 132/63  Pulse: 95  Resp: 18  Temp: 98.1 F (36.7 C)  SpO2: 95%    Constitutional: NAD, calm, comfortable Vitals:   01/13/23 1315  BP: 132/63  Pulse: 95  Resp: 18  Temp: 98.1 F (36.7 C)  SpO2: 95%   Eyes: PERRL, lids and conjunctivae normal ENMT: Mucous membranes are dry. Posterior pharynx clear of any exudate or lesions.Normal dentition.  Neck: normal, supple, no masses, no thyromegaly Respiratory: clear to auscultation bilaterally, no wheezing, no crackles. Normal respiratory effort. No accessory muscle use.  Cardiovascular: Regular rate and rhythm, no murmurs / rubs / gallops. No extremity edema. 2+ pedal pulses. No carotid bruits.  Abdomen: no tenderness, no masses palpated. No  hepatosplenomegaly. Bowel sounds positive.  Musculoskeletal: no clubbing / cyanosis. No joint deformity upper and lower extremities. Good ROM, no contractures. Normal muscle tone.  Skin: no rashes, lesions, ulcers. No induration Neurologic: CN 2-12 grossly intact. Sensation intact, DTR normal. Strength 5/5 in all 4.  Psychiatric: Normal judgment and insight. Alert and oriented x 3. Normal mood.     Labs on Admission: I have personally reviewed following labs and imaging studies  CBC: Recent Labs  Lab 01/13/23 1319  WBC 7.8  HGB 15.4*  HCT 47.2*  MCV 94.6  PLT 244   Basic Metabolic Panel: Recent Labs  Lab 01/13/23 1319  NA 129*  K 4.3  CL 97*  CO2 17*  GLUCOSE 269*  BUN 24*  CREATININE 1.99*  CALCIUM 9.5   GFR: CrCl cannot be calculated (Unknown ideal weight.). Liver Function Tests: Recent Labs  Lab 01/13/23 1319  AST 50*  ALT 29  ALKPHOS 94  BILITOT 1.1  PROT 8.2*  ALBUMIN 3.9   Recent Labs  Lab 01/13/23 1319  LIPASE 54*   No results for input(s): "AMMONIA" in the last 168 hours. Coagulation Profile: No results for input(s): "INR", "PROTIME" in the last 168 hours. Cardiac Enzymes: No results for input(s): "CKTOTAL", "CKMB", "CKMBINDEX", "TROPONINI" in the last 168 hours. BNP (last 3 results) Recent Labs    12/15/22 0831  PROBNP 51.0   HbA1C: No results for input(s): "HGBA1C" in the last 72 hours. CBG: No results for input(s): "GLUCAP" in the last 168 hours. Lipid Profile: No results for input(s): "CHOL", "HDL", "LDLCALC", "TRIG", "CHOLHDL", "LDLDIRECT" in the last 72 hours. Thyroid Function Tests: No results for input(s): "TSH", "T4TOTAL", "FREET4", "T3FREE", "THYROIDAB" in the last 72 hours. Anemia Panel: No results for input(s): "VITAMINB12", "FOLATE", "FERRITIN", "TIBC", "IRON", "RETICCTPCT" in the last 72 hours. Urine analysis:    Component Value Date/Time   COLORURINE YELLOW (A) 08/12/2022 0917   APPEARANCEUR Clear 11/14/2022 0904    LABSPEC 1.011 08/12/2022 1191  PHURINE 6.0 08/12/2022 0917   GLUCOSEU Negative 11/14/2022 0904   HGBUR NEGATIVE 08/12/2022 0917   HGBUR trace-lysed 09/08/2008 1009   BILIRUBINUR Negative 11/14/2022 0904   KETONESUR NEGATIVE 08/12/2022 0917   PROTEINUR Trace (A) 11/14/2022 0904   PROTEINUR NEGATIVE 08/12/2022 0917   UROBILINOGEN 0.2 01/12/2021 1459   UROBILINOGEN 0.2 09/08/2008 1009   NITRITE Negative 11/14/2022 0904   NITRITE NEGATIVE 08/12/2022 0917   LEUKOCYTESUR 1+ (A) 11/14/2022 0904   LEUKOCYTESUR NEGATIVE 08/12/2022 0917    Radiological Exams on Admission: No results found.  EKG: INone  Assessment/Plan Principal Problem:   AKI (acute kidney injury) (HCC) Active Problems:   Watery diarrhea  (please populate well all problems here in Problem List. (For example, if patient is on BP meds at home and you resume or decide to hold them, it is a problem that needs to be her. Same for CAD, COPD, HLD and so on)  AKI on CKD stage II with non-anion gap metabolic acidosis Severe dehydration -Prerenal -Secondary to GI loss from acute diarrhea -Continue IV hydration of bicarb drip and encourage patient to increase p.o. fluid intake.  Acute watery diarrhea -Rule out C. difficile as patient been on chronic suppressive antibiotic therapy -GI pathogen also pending -Overall, low suspicion for infectious etiology, no white count or other systemic infection signs such as fever.  Plan to continue as needed Lomotil  -Probiotics  Hyponatremia -Volume contracted secondary to GI loss -Correct volume status then reevaluate sodium level -TSH  HTN -Hold off BP meds -Start as needed hydralazine   DVT prophylaxis: Lovenox Code Status: Full code Family Communication: Husband at bedside Disposition Plan: Patient is sick with ongoing watery diarrhea and AKI, requiring IV hydration and further workup and etiology of diarrhea, expect more than 2 midnight hospital stay Consults called:  None Admission status: Telemetry admission   Emeline General MD [facility] Pager [phone]  01/13/2023, 4:47 PM

## 2023-01-13 NOTE — ED Provider Notes (Signed)
[facility] Provider Note    Event Date/Time   First MD Initiated Contact with Patient 01/13/23 1510     (approximate)   History   Chief Complaint Diarrhea   HPI  Jamie [name] is a 67 y.o. female with past medical history of hypertension, diabetes, CKD, and ulcerative colitis status post colectomy end ileostomy who presents to the ED complaining of diarrhea.  Patient reports that for the past 2 to 3 days she has been having significant watery output from her ileostomy.  She reports occasional crampy pain in her abdomen but denies any pain currently.  She has been feeling nauseous at times with decreased oral intake but denies any vomiting.  She denies any fevers or difficulty urinating.  She has been taking Imodium at home without relief, now reports increasing generalized weakness and malaise.     Physical Exam   Triage Vital Signs: ED Triage Vitals  Encounter Vitals Group     BP 01/13/23 1315 132/63     Systolic BP Percentile --      Diastolic BP Percentile --      Pulse Rate 01/13/23 1315 95     Resp 01/13/23 1315 18     Temp 01/13/23 1315 98.1 F (36.7 C)     Temp src --      SpO2 01/13/23 1315 95 %     Weight --      Height --      Head Circumference --      Peak Flow --      Pain Score 01/13/23 1312 0     Pain Loc --      Pain Education --      Exclude from Growth Chart --     Most recent vital signs: Vitals:   01/13/23 1315  BP: 132/63  Pulse: 95  Resp: 18  Temp: 98.1 F (36.7 C)  SpO2: 95%    Constitutional: Alert and oriented. Eyes: Conjunctivae are normal. Head: Atraumatic. Nose: No congestion/rhinnorhea. Mouth/Throat: Mucous membranes are moist.  Cardiovascular: Normal rate, regular rhythm. Grossly normal heart sounds.  2+ radial pulses bilaterally. Respiratory: Normal respiratory effort.  No retractions. Lungs CTAB. Gastrointestinal: Soft and nontender. No distention.  Significant watery output into ileostomy  bag. Musculoskeletal: No lower extremity tenderness nor edema.  Neurologic:  Normal speech and language. No gross focal neurologic deficits are appreciated.    ED Results / Procedures / Treatments   Labs (all labs ordered are listed, but only abnormal results are displayed) Labs Reviewed  LIPASE, BLOOD - Abnormal; Notable for the following components:      Result Value   Lipase 54 (*)    All other components within normal limits  COMPREHENSIVE METABOLIC PANEL - Abnormal; Notable for the following components:   Sodium 129 (*)    Chloride 97 (*)    CO2 17 (*)    Glucose, Bld 269 (*)    BUN 24 (*)    Creatinine, Ser 1.99 (*)    Total Protein 8.2 (*)    AST 50 (*)    GFR, Estimated 27 (*)    All other components within normal limits  CBC - Abnormal; Notable for the following components:   Hemoglobin 15.4 (*)    HCT 47.2 (*)    All other components within normal limits  GASTROINTESTINAL PANEL BY PCR, STOOL (REPLACES STOOL CULTURE)  C DIFFICILE QUICK SCREEN W PCR REFLEX    URINALYSIS, ROUTINE W REFLEX MICROSCOPIC  PROCEDURES:  Critical Care performed: No  Procedures   MEDICATIONS ORDERED IN ED: Medications  lactated ringers bolus 1,000 mL (has no administration in time range)  diphenoxylate-atropine (LOMOTIL) 2.5-0.025 MG per tablet 1 tablet (has no administration in time range)     IMPRESSION / MDM / ASSESSMENT AND PLAN / ED COURSE  I reviewed the triage vital signs and the nursing notes.                              67 y.o. female with past medical history of hypertension, diabetes, CKD, and ulcerative colitis status post colectomy end ileostomy who presents to the ED complaining of watery output from ileostomy with generalized weakness for the past 2 to 3 days.  Patient's presentation is most consistent with acute presentation with potential threat to life or bodily function.  Differential diagnosis includes, but is not limited to, gastroenteritis,  dehydration, electrolyte abnormality, AKI, UTI.  Patient nontoxic-appearing and in no acute distress, vital signs are unremarkable.  Abdominal exam is benign but she does appear clinically dehydrated with significant watery output into her ileostomy.  We will send output for stool studies, labs show AKI with associated acidosis, no acute electrolyte abnormality noted.  No significant anemia or leukocytosis noted and her abdominal exam is benign, do not feel CT imaging required at this time.  We will hydrate with IV fluids, treat with Lomotil.  Case discussed with hospitalist for admission.      FINAL CLINICAL IMPRESSION(S) / ED DIAGNOSES   Final diagnoses:  High output ileostomy (HCC)  AKI (acute kidney injury) (HCC)     Rx / DC Orders   ED Discharge Orders     None        Note:  This document was prepared using Dragon voice recognition software and may include unintentional dictation errors.   Chesley Noon, MD 01/13/23 [phone]

## 2023-01-13 NOTE — ED Triage Notes (Signed)
Pt here POV with reports of increased output from ostomy for the last several days. Pt has been taking imodium without relief. Endorses intermittent abdominal pain and emesis X1.

## 2023-01-14 DIAGNOSIS — N179 Acute kidney failure, unspecified: Secondary | ICD-10-CM | POA: Diagnosis not present

## 2023-01-14 LAB — BASIC METABOLIC PANEL
Anion gap: 13 (ref 5–15)
Anion gap: 16 — ABNORMAL HIGH (ref 5–15)
BUN: 28 mg/dL — ABNORMAL HIGH (ref 8–23)
BUN: 35 mg/dL — ABNORMAL HIGH (ref 8–23)
CO2: 22 mmol/L (ref 22–32)
CO2: 19 mmol/L — ABNORMAL LOW (ref 22–32)
Calcium: 9 mg/dL (ref 8.9–10.3)
Calcium: 8.9 mg/dL (ref 8.9–10.3)
Chloride: 93 mmol/L — ABNORMAL LOW (ref 98–111)
Chloride: 90 mmol/L — ABNORMAL LOW (ref 98–111)
Creatinine, Ser: 1.69 mg/dL — ABNORMAL HIGH (ref 0.44–1.00)
Creatinine, Ser: 2.35 mg/dL — ABNORMAL HIGH (ref 0.44–1.00)
GFR, Estimated: 33 mL/min — ABNORMAL LOW (ref >60)
GFR, Estimated: 22 mL/min — ABNORMAL LOW (ref >60)
Glucose, Bld: 172 mg/dL — ABNORMAL HIGH (ref 70–99)
Glucose, Bld: 221 mg/dL — ABNORMAL HIGH (ref 70–99)
Potassium: 3.4 mmol/L — ABNORMAL LOW (ref 3.5–5.1)
Potassium: 4 mmol/L (ref 3.5–5.1)
Sodium: 128 mmol/L — ABNORMAL LOW (ref 135–145)
Sodium: 125 mmol/L — ABNORMAL LOW (ref 135–145)

## 2023-01-14 LAB — CBG MONITORING, ED
Glucose-Capillary: 179 mg/dL — ABNORMAL HIGH (ref 70–99)
Glucose-Capillary: 247 mg/dL — ABNORMAL HIGH (ref 70–99)
Glucose-Capillary: 226 mg/dL — ABNORMAL HIGH (ref 70–99)

## 2023-01-14 LAB — MAGNESIUM
Magnesium: 1.9 mg/dL (ref 1.7–2.4)
Magnesium: 3.1 mg/dL — ABNORMAL HIGH (ref 1.7–2.4)

## 2023-01-14 LAB — GLUCOSE, CAPILLARY: Glucose-Capillary: 198 mg/dL — ABNORMAL HIGH (ref 70–99)

## 2023-01-14 MED ORDER — MORPHINE SULFATE (PF) 2 MG/ML IV SOLN
1.0000 mg | INTRAVENOUS | Status: DC | PRN
  Administered 2023-01-14: 1 mg via INTRAVENOUS
  Filled 2023-01-14: qty 1

## 2023-01-14 MED ORDER — POTASSIUM CHLORIDE CRYS ER 20 MEQ PO TBCR
40.0000 meq | EXTENDED_RELEASE_TABLET | Freq: Once | ORAL | Status: AC
  Administered 2023-01-14: 40 meq via ORAL
  Filled 2023-01-14: qty 2

## 2023-01-14 MED ORDER — SODIUM CHLORIDE 0.9 % IV SOLN: INTRAVENOUS | Status: AC

## 2023-01-14 MED ORDER — MAGNESIUM SULFATE 2 GM/50ML IV SOLN
2.0000 g | Freq: Once | INTRAVENOUS | Status: AC
  Administered 2023-01-14: 2 g via INTRAVENOUS
  Filled 2023-01-14: qty 50

## 2023-01-14 MED ORDER — MORPHINE SULFATE (PF) 2 MG/ML IV SOLN
1.0000 mg | Freq: Once | INTRAVENOUS | Status: AC
  Administered 2023-01-14: 1 mg via INTRAVENOUS
  Filled 2023-01-14: qty 1

## 2023-01-14 MED ORDER — DIPHENOXYLATE-ATROPINE 2.5-0.025 MG PO TABS
1.0000 | ORAL_TABLET | Freq: Four times a day (QID) | ORAL | Status: DC
Start: 2023-01-14 — End: 2023-01-15
  Administered 2023-01-14 – 2023-01-15 (×3): 1 via ORAL
  Filled 2023-01-14 (×3): qty 1

## 2023-01-14 NOTE — Progress Notes (Addendum)
Progress Note   Patient: Jamie [name] UEA:[id number] DOB: 06-10-1955 DOA: 01/13/2023     1 DOS: the patient was seen and examined on 01/14/2023   Brief hospital course:   Jamie [name] is a 67 y.o. female with medical history significant of ulcerative colitis status post total colectomy and ileostomy, HTN, HLD, IIDM, chronic recurrent UTI on suppressive antibiotic therapy, CKD stage II, presented with persistent diarrhea.   Symptoms started on Thursday night, patient started to pass watery diarrhea in the ileostomy bag, overnight she had to empty the ileostomy bag every 3-4 hours and then every 2-3 hours yesterday and continued to have high output diarrhea this morning, and decided to come to ED.  Denied any nauseous vomiting no abdominal pain no fever or chills.  She has been taking Imodium 3 times a day with no significant effect.  Patient does take Keflex once a day since June this year for UTI prophylaxis by urology.  But she denied any diarrhea before this week after she has been on the antibiotics.  Denied any other new medications.  Family member including her husband have no GI symptoms at all.   ED Course: Afebrile, borderline tachycardia blood pressure 130/60 nonhypoxic.  Blood work showed sodium 129, creatinine 1.9 compared to baseline 1.2-1.5 at her baseline, bicarb 17, K4.3 glucose 269.  WBC 7.8, hemoglobin 15.4.   Patient was given Lomotil p.o. x 1 in the ED   Review of Systems: As per HPI otherwise 14 point review of systems negative      Assessment and Plan:  AKI on CKD stage II with non-anion gap metabolic acidosis and Hyponatremia Severe dehydration -Prerenal -Secondary to GI loss from acute diarrhea -Baseline serum creatinine of 1.20 and on admission it was 1.9.  Down to 1.6 with hydration -Will discontinue bicarbonate infusion -Continue IV fluid hydration with normal saline -Expect improvement in sodium levels with hydration    Acute viral  gastroenteritis C. difficile toxin is negative Stool PCR yields astrovirus Continue supportive care as patient continues to have high output from her ileostomy with aggressive IV fluid hydration, Imodium, antiemetics     HTN Blood pressure stable Monitor closely    Hypokalemia Related to GI losses Supplement potassium Check magnesium levels     Subjective: Complains of feeling weak.  Continues to have high output from ileostomy.  Physical Exam: Vitals:   01/14/23 0722 01/14/23 0828 01/14/23 1138 01/14/23 1141  BP:  (!) 139/55  129/60  Pulse: 91 93  92  Resp: 18 20  17   Temp:  98.3 F (36.8 C) 98.1 F (36.7 C)   TempSrc:  Oral Oral   SpO2: 97% 97%  100%   Eyes: PERRL, lids and conjunctivae normal ENMT: Mucous membranes are moist. Posterior pharynx clear of any exudate or lesions.Normal dentition.  Neck: normal, supple, no masses, no thyromegaly Respiratory: clear to auscultation bilaterally, no wheezing, no crackles. Normal respiratory effort. No accessory muscle use.  Cardiovascular: Regular rate and rhythm, no murmurs / rubs / gallops. No extremity edema. 2+ pedal pulses. No carotid bruits.  Abdomen: no tenderness, no masses palpated. No hepatosplenomegaly. Bowel sounds positive.  Ileostomy in place Musculoskeletal: no clubbing / cyanosis. No joint deformity upper and lower extremities. Good ROM, no contractures. Normal muscle tone.  Skin: no rashes, lesions, ulcers. No induration Neurologic: CN 2-12 grossly intact. Sensation intact, DTR normal. Strength 5/5 in all 4.  Psychiatric: Normal judgment and insight. Alert and oriented x 3. Normal mood.  Data Reviewed: Serum creatinine is 1.69 down from 1.9.  Baseline 1.20 There are no new results to review at this time.  Family Communication: Plan of care discussed with patient in detail.  She verbalizes understanding and agrees with  Disposition: Status is: Inpatient Remains inpatient appropriate because:  Continues to require IV fluid hydration  Planned Discharge Destination: Home    Time spent: 35 minutes  Author: Lucile Shutters, MD 01/14/2023 2:31 PM  For on call review [email or web address].

## 2023-01-15 ENCOUNTER — Other Ambulatory Visit: Payer: Self-pay

## 2023-01-15 DIAGNOSIS — N179 Acute kidney failure, unspecified: Secondary | ICD-10-CM | POA: Diagnosis not present

## 2023-01-15 LAB — BASIC METABOLIC PANEL
Anion gap: 13 (ref 5–15)
BUN: 36 mg/dL — ABNORMAL HIGH (ref 8–23)
CO2: 23 mmol/L (ref 22–32)
Calcium: 8.5 mg/dL — ABNORMAL LOW (ref 8.9–10.3)
Chloride: 91 mmol/L — ABNORMAL LOW (ref 98–111)
Creatinine, Ser: 2.04 mg/dL — ABNORMAL HIGH (ref 0.44–1.00)
GFR, Estimated: 26 mL/min — ABNORMAL LOW (ref >60)
Glucose, Bld: 189 mg/dL — ABNORMAL HIGH (ref 70–99)
Potassium: 3.7 mmol/L (ref 3.5–5.1)
Sodium: 127 mmol/L — ABNORMAL LOW (ref 135–145)

## 2023-01-15 LAB — MAGNESIUM: Magnesium: 2.6 mg/dL — ABNORMAL HIGH (ref 1.7–2.4)

## 2023-01-15 LAB — GLUCOSE, CAPILLARY
Glucose-Capillary: 161 mg/dL — ABNORMAL HIGH (ref 70–99)
Glucose-Capillary: 221 mg/dL — ABNORMAL HIGH (ref 70–99)
Glucose-Capillary: 198 mg/dL — ABNORMAL HIGH (ref 70–99)
Glucose-Capillary: 192 mg/dL — ABNORMAL HIGH (ref 70–99)

## 2023-01-15 MED ORDER — SODIUM CHLORIDE 0.9 % IV SOLN: INTRAVENOUS | Status: AC

## 2023-01-15 MED ORDER — DIPHENOXYLATE-ATROPINE 2.5-0.025 MG PO TABS
1.0000 | ORAL_TABLET | Freq: Once | ORAL | Status: AC
  Administered 2023-01-15: 1 via ORAL
  Filled 2023-01-15: qty 1

## 2023-01-15 NOTE — Plan of Care (Signed)
  Problem: Education: Goal: Ability to describe self-care measures that may prevent or decrease complications (Diabetes Survival Skills Education) will improve Outcome: Progressing Goal: Individualized Educational Video(s) Outcome: Progressing   Problem: Coping: Goal: Ability to adjust to condition or change in health will improve Outcome: Progressing   Problem: Nutritional: Goal: Maintenance of adequate nutrition will improve Outcome: Progressing Goal: Progress toward achieving an optimal weight will improve Outcome: Progressing   Problem: Skin Integrity: Goal: Risk for impaired skin integrity will decrease Outcome: Progressing   Problem: Education: Goal: Knowledge of General Education information will improve Description: Including pain rating scale, medication(s)/side effects and non-pharmacologic comfort measures Outcome: Progressing   Problem: Nutrition: Goal: Adequate nutrition will be maintained Outcome: Progressing

## 2023-01-15 NOTE — Progress Notes (Signed)
Progress Note   Patient: Jamie [name] YQM:[id number] DOB: 1955/03/06 DOA: 01/13/2023     2 DOS: the patient was seen and examined on 01/15/2023   Brief hospital course:  Jamie [name] is a 67 y.o. female with medical history significant of ulcerative colitis status post total colectomy and ileostomy, HTN, HLD, IIDM, chronic recurrent UTI on suppressive antibiotic therapy, CKD stage III, presented with persistent diarrhea.   Symptoms started on Thursday night, patient started to pass watery diarrhea in the ileostomy bag, overnight she had to empty the ileostomy bag every 3-4 hours and then every 2-3 hours yesterday and continued to have high output diarrhea this morning, and decided to come to ED.  Denied any nauseous vomiting no abdominal pain no fever or chills.  She has been taking Imodium 3 times a day with no significant effect.  Patient does take Keflex once a day since June this year for UTI prophylaxis by urology.  But she denied any diarrhea before this week after she has been on the antibiotics.  Denied any other new medications.  Family member including her husband have no GI symptoms at all.   ED Course: Afebrile, borderline tachycardia blood pressure 130/60 nonhypoxic.  Blood work showed sodium 129, creatinine 1.9 compared to baseline 1.2-1.5 at her baseline, bicarb 17, K4.3 glucose 269.  WBC 7.8, hemoglobin 15.4.   Patient was given Lomotil p.o. x 1 in the ED   Review of Systems: As per HPI otherwise 14 point review of systems negative      Assessment and Plan:  AKI on CKD stage III with non-anion gap metabolic acidosis and Hyponatremia Severe dehydration -Prerenal -Secondary to GI loss from acute diarrhea -Baseline serum creatinine of 1.20 and on admission it was 1.9.   -Initial improvement in serum creatinine with IV fluid hydration to 1.6 and then he bumped up to 2.35 >> 2.04 -Patient was initially placed on bicarbonate infusion for metabolic acidosis which was  subsequently switched to normal saline following improvement in her bicarbonate levels  -Expect improvement in sodium levels with hydration      Acute viral gastroenteritis C. difficile toxin is negative Stool PCR yields astrovirus Continue supportive care as patient continues to have high output from her ileostomy with aggressive IV fluid hydration, Imodium, antiemetics       HTN Blood pressure stable Monitor closely     Hypokalemia Related to GI losses Potassium has been supplemented         Subjective: Feels better.  Decreased output from her ileostomy  Physical Exam: Vitals:   01/14/23 2013 01/15/23 0010 01/15/23 0445 01/15/23 1134  BP: (!) 131/39 (!) 133/48 (!) 129/52 (!) 120/54  Pulse: 91 86 82 91  Resp:    16  Temp: 97.7 F (36.5 C) 97.8 F (36.6 C) 97.6 F (36.4 C) (!) 97.4 F (36.3 C)  TempSrc:      SpO2: 97% 93% 94% 99%  Weight: 114.1 kg     Height: 5\' 5"  (1.651 m)      Eyes: PERRL, lids and conjunctivae normal ENMT: Mucous membranes are moist. Posterior pharynx clear of any exudate or lesions.Normal dentition.  Neck: normal, supple, no masses, no thyromegaly Respiratory: clear to auscultation bilaterally, no wheezing, no crackles. Normal respiratory effort. No accessory muscle use.  Cardiovascular: Regular rate and rhythm, no murmurs / rubs / gallops. No extremity edema. 2+ pedal pulses. No carotid bruits.  Abdomen: no tenderness, no masses palpated. No hepatosplenomegaly. Bowel sounds positive.  Ileostomy in  place Musculoskeletal: no clubbing / cyanosis. No joint deformity upper and lower extremities. Good ROM, no contractures. Normal muscle tone.  Skin: no rashes, lesions, ulcers. No induration Neurologic: CN 2-12 grossly intact. Sensation intact, DTR normal. Strength 5/5 in all 4.  Psychiatric: Normal judgment and insight. Alert and oriented x 3. Normal mood.      Data Reviewed: Labs reviewed, sodium 127, BUN 36, creatinine 2.04 There are no  new results to review at this time.  Family Communication: Plan of care discussed with patient and her husband at the bedside.  They verbalized understanding and agree with the plan  Disposition: Status is: Inpatient Remains inpatient appropriate because: Continues to require IV fluid hydration  Planned Discharge Destination: Home    Time spent: 35  minutes  Author: Lucile Shutters, MD 01/15/2023 12:09 PM  For on call review [email or web address].

## 2023-01-16 DIAGNOSIS — N179 Acute kidney failure, unspecified: Secondary | ICD-10-CM | POA: Diagnosis not present

## 2023-01-16 DIAGNOSIS — A084 Viral intestinal infection, unspecified: Secondary | ICD-10-CM | POA: Diagnosis present

## 2023-01-16 LAB — URINALYSIS, ROUTINE W REFLEX MICROSCOPIC
Bilirubin Urine: NEGATIVE
Glucose, UA: NEGATIVE mg/dL
Ketones, ur: NEGATIVE mg/dL
Nitrite: NEGATIVE
Protein, ur: NEGATIVE mg/dL
Specific Gravity, Urine: 1.004 — ABNORMAL LOW (ref 1.005–1.030)
pH: 6 (ref 5.0–8.0)

## 2023-01-16 LAB — BASIC METABOLIC PANEL
Anion gap: 8 (ref 5–15)
BUN: 30 mg/dL — ABNORMAL HIGH (ref 8–23)
CO2: 21 mmol/L — ABNORMAL LOW (ref 22–32)
Calcium: 8.1 mg/dL — ABNORMAL LOW (ref 8.9–10.3)
Chloride: 101 mmol/L (ref 98–111)
Creatinine, Ser: 1.47 mg/dL — ABNORMAL HIGH (ref 0.44–1.00)
GFR, Estimated: 39 mL/min — ABNORMAL LOW (ref >60)
Glucose, Bld: 135 mg/dL — ABNORMAL HIGH (ref 70–99)
Potassium: 3.9 mmol/L (ref 3.5–5.1)
Sodium: 130 mmol/L — ABNORMAL LOW (ref 135–145)

## 2023-01-16 LAB — GLUCOSE, CAPILLARY
Glucose-Capillary: 136 mg/dL — ABNORMAL HIGH (ref 70–99)
Glucose-Capillary: 223 mg/dL — ABNORMAL HIGH (ref 70–99)

## 2023-01-16 NOTE — Discharge Summary (Signed)
Physician Discharge Summary   Patient: Jamie [name] MRN: [id number] DOB: 08-14-1955  Admit date:     01/13/2023  Discharge date: 01/16/23  Discharge Physician: Shahida Schnackenberg   PCP: Judy Pimple, MD   Recommendations at discharge:   Good hand hygiene Hold furosemide and losartan and resume on 01/18/23  Discharge Diagnoses: Principal Problem:   AKI (acute kidney injury) (HCC) Active Problems:   Viral gastroenteritis   Essential hypertension   Ulcerative colitis (HCC)   Morbid obesity (HCC)   CKD stage 3 due to type 2 diabetes mellitus (HCC)  Resolved Problems:   * No resolved hospital problems. *  Hospital Course:  Jamie [name] is a 67 y.o. female with medical history significant of ulcerative colitis status post total colectomy and ileostomy, HTN, HLD, IIDM, chronic recurrent UTI on suppressive antibiotic therapy, CKD stage II, presented with persistent diarrhea.   Symptoms started on Thursday night, patient started to pass watery diarrhea in the ileostomy bag, overnight she had to empty the ileostomy bag every 3-4 hours and then every 2-3 hours yesterday and continued to have high output diarrhea this morning, and decided to come to ED.  Denied any nauseous vomiting no abdominal pain no fever or chills.  She has been taking Imodium 3 times a day with no significant effect.  Patient does take Keflex once a day since June this year for UTI prophylaxis by urology.  But she denied any diarrhea before this week after she has been on the antibiotics.  Denied any other new medications.  Family member including her husband have no GI symptoms at all.   ED Course: Afebrile, borderline tachycardia blood pressure 130/60 nonhypoxic.  Blood work showed sodium 129, creatinine 1.9 compared to baseline 1.2-1.5 at her baseline, bicarb 17, K4.3 glucose 269.  WBC 7.8, hemoglobin 15.4.   Patient was given Lomotil p.o. x 1 in the ED   Review of Systems: As per HPI otherwise 14 point review  of systems negative.    Assessment and Plan:   AKI on CKD stage III with non-anion gap metabolic acidosis and Hyponatremia Severe dehydration -Prerenal -Secondary to GI loss from acute diarrhea -Baseline serum creatinine of 1.20 and on admission it was 1.9.   -Initial improvement in serum creatinine with IV fluid hydration to 1.6 and then she bumped up to 2.35 >> 2.04 >> 1.47 -Patient was initially placed on bicarbonate infusion for metabolic acidosis which was subsequently switched to normal saline following improvement in her bicarbonate levels  -Renal function and sodium levels have improved with hydration and patient's output from her ileostomy has improved and is back to baseline -Patient advised to resume her Lasix and losartan in 48 hours       Acute viral gastroenteritis C. difficile toxin is negative Stool PCR yields astrovirus Supportive care  with  IV fluid hydration, Imodium, antiemetics       HTN Blood pressure is stable Monitor closely     Hypokalemia Related to GI losses Potassium has been supplemented            Consultants: None Procedures performed: None  Disposition: Home Diet recommendation:  Discharge Diet Orders (From admission, onward)     Start     Ordered   01/16/23 0000  Diet - low sodium heart healthy        01/16/23 1007   01/16/23 0000  Diet Carb Modified        01/16/23 1007  Cardiac and Carb modified diet DISCHARGE MEDICATION: Allergies as of 01/16/2023       Reactions   Ciprofloxacin Rash   Erythema and pruritis around IV site, erythema and rash along vein   Other Rash   Jardiance [empagliflozin]    Increase in Cr    Lisinopril    REACTION: felt bad/ stomach hurt/ weak and tired   Metformin And Related    GI   Sulfa Antibiotics Rash        Medication List     STOP taking these medications    cephALEXin 250 MG capsule Commonly known as: Keflex       TAKE these medications    Accu-Chek  Guide test strip Generic drug: glucose blood TO CHECK GLUCOSE DAILY AND AS NEEDED FOR TYPE 2 DIABETES   Accu-Chek Guide w/Device Kit To check glucose daily and as needed for type 2 diabetes   accu-chek multiclix lancets To check glucose daily and as needed for diabetes type 2   acetaminophen 325 MG tablet Commonly known as: TYLENOL Take 650 mg by mouth every 6 (six) hours as needed for moderate pain.   atorvastatin 10 MG tablet Commonly known as: LIPITOR TAKE 1/2 PILL BY MOUTH EVERY OTHER DAY   B-12 1000 MCG Tabs Take 1 tablet by mouth daily.   esomeprazole 20 MG capsule Commonly known as: NEXIUM Take 20 mg by mouth every other day.   fluticasone 50 MCG/ACT nasal spray Commonly known as: FLONASE Place 2 sprays into both nostrils at bedtime.   FreeStyle Libre 3 Sensor Misc Use as directed for diabetes type 2 requiring insulin   furosemide 20 MG tablet Commonly known as: LASIX Take 1 tablet (20 mg total) by mouth daily for 3 days. What changed: when to take this   losartan 25 MG tablet Commonly known as: COZAAR Take 25 mg by mouth daily.   metoprolol tartrate 25 MG tablet Commonly known as: LOPRESSOR TAKE 1 TABLET BY MOUTH EVERY DAY   multivitamin tablet Take 1 tablet by mouth daily.   NON FORMULARY SLEEPS WITH BI-PAP   phenazopyridine 200 MG tablet Commonly known as: Pyridium Take 1 tablet (200 mg total) by mouth 3 (three) times daily as needed for pain.   simethicone 80 MG chewable tablet Commonly known as: MYLICON Chew 1 tablet (80 mg total) by mouth 4 (four) times daily as needed for flatulence.   tirzepatide 10 MG/0.5ML Pen Commonly known as: MOUNJARO Inject 10 mg into the skin once a week.   Vitamin D3 25 MCG (1000 UT) Caps Take 1,000 Units by mouth daily.   zolpidem 10 MG tablet Commonly known as: AMBIEN Take 0.5 tablets (5 mg total) by mouth at bedtime as needed for sleep. Caution of sedation and falls        Follow-up Information      Tower, Audrie Gallus, MD Follow up.   Specialties: Family Medicine, Radiology Why: Patient to make own Hospital follow up Contact information: [address] [place] [address] [phone]                Discharge Exam: Ceasar Mons Weights   01/14/23 2013  Weight: 114.1 kg   Eyes: PERRL, lids and conjunctivae normal ENMT: Mucous membranes are moist. Posterior pharynx clear of any exudate or lesions.Normal dentition.  Neck: normal, supple, no masses, no thyromegaly Respiratory: clear to auscultation bilaterally, no wheezing, no crackles. Normal respiratory effort. No accessory muscle use.  Cardiovascular: Regular rate and rhythm, no murmurs /  rubs / gallops. No extremity edema. 2+ pedal pulses. No carotid bruits.  Abdomen: no tenderness, no masses palpated. No hepatosplenomegaly. Bowel sounds positive.  Ileostomy in place Musculoskeletal: no clubbing / cyanosis. No joint deformity upper and lower extremities. Good ROM, no contractures. Normal muscle tone.  Skin: no rashes, lesions, ulcers. No induration Neurologic: CN 2-12 grossly intact. Sensation intact, DTR normal. Strength 5/5 in all 4.  Psychiatric: Normal judgment and insight. Alert and oriented x 3. Normal mood.      Condition at discharge: stable  The results of significant diagnostics from this hospitalization (including imaging, microbiology, ancillary and laboratory) are listed below for reference.   Imaging Studies: No results found.  Microbiology: Results for orders placed or performed during the hospital encounter of 01/13/23  Gastrointestinal Panel by PCR , Stool     Status: Abnormal   Collection Time: 01/13/23  4:52 PM   Specimen: Stool  Result Value Ref Range Status   Campylobacter species NOT DETECTED NOT DETECTED Final   Plesimonas shigelloides NOT DETECTED NOT DETECTED Final   Salmonella species NOT DETECTED NOT DETECTED Final   Yersinia enterocolitica NOT DETECTED NOT DETECTED Final   Vibrio  species NOT DETECTED NOT DETECTED Final   Vibrio cholerae NOT DETECTED NOT DETECTED Final   Enteroaggregative E coli (EAEC) NOT DETECTED NOT DETECTED Final   Enteropathogenic E coli (EPEC) NOT DETECTED NOT DETECTED Final   Enterotoxigenic E coli (ETEC) NOT DETECTED NOT DETECTED Final   Shiga like toxin producing E coli (STEC) NOT DETECTED NOT DETECTED Final   Shigella/Enteroinvasive E coli (EIEC) NOT DETECTED NOT DETECTED Final   Cryptosporidium NOT DETECTED NOT DETECTED Final   Cyclospora cayetanensis NOT DETECTED NOT DETECTED Final   Entamoeba histolytica NOT DETECTED NOT DETECTED Final   Giardia lamblia NOT DETECTED NOT DETECTED Final   Adenovirus F40/41 NOT DETECTED NOT DETECTED Final   Astrovirus DETECTED (A) NOT DETECTED Final   Norovirus GI/GII NOT DETECTED NOT DETECTED Final   Rotavirus A NOT DETECTED NOT DETECTED Final   Sapovirus (I, II, IV, and V) NOT DETECTED NOT DETECTED Final    Comment: Performed at [facility], [address] [place], [place] [address]  C Difficile Quick Screen w PCR reflex     Status: None   Collection Time: 01/13/23  4:52 PM   Specimen: Stool  Result Value Ref Range Status   C Diff antigen NEGATIVE NEGATIVE Final   C Diff toxin NEGATIVE NEGATIVE Final   C Diff interpretation No C. difficile detected.  Final    Comment: Performed at [facility], [address] [place], [place] [address]    Labs: CBC: Recent Labs  Lab 01/13/23 1319  WBC 7.8  HGB 15.4*  HCT 47.2*  MCV 94.6  PLT 244   Basic Metabolic Panel: Recent Labs  Lab 01/13/23 1319 01/14/23 0537 01/14/23 0953 01/14/23 1627 01/15/23 0455 01/16/23 0658  NA 129* 128*  --  125* 127* 130*  K 4.3 3.4*  --  4.0 3.7 3.9  CL 97* 93*  --  90* 91* 101  CO2 17* 22  --  19* 23 21*  GLUCOSE 269* 172*  --  221* 189* 135*  BUN 24* 28*  --  35* 36* 30*  CREATININE 1.99* 1.69*  --  2.35* 2.04* 1.47*  CALCIUM 9.5 9.0  --  8.9 8.5* 8.1*  MG  --   --  1.9 3.1* 2.6*  --     Liver Function Tests: Recent Labs  Lab 01/13/23 1319  AST 50*  ALT 29  ALKPHOS 94  BILITOT 1.1  PROT 8.2*  ALBUMIN 3.9   CBG: Recent Labs  Lab 01/15/23 0821 01/15/23 1135 01/15/23 1709 01/15/23 2124 01/16/23 0741  GLUCAP 161* 221* 198* 192* 136*    Discharge time spent: greater than 30 minutes.  Signed: Lucile Shutters, MD [facility] 01/16/2023

## 2023-01-16 NOTE — Care Management Important Message (Signed)
Important Message  Patient Details  Name: MAKAYLE GRIESSER MRN: [id number] Date of Birth: 19-Sep-1955   Important Message Given:  Yes - Medicare IM     Sherilyn Banker 01/16/2023, 11:29 AM

## 2023-01-16 NOTE — TOC CM/SW Note (Signed)
Transition of Care [facility]) - Inpatient Brief Assessment   Patient Details  Name: Jamie [name] MRN: [id number] Date of Birth: 1955/10/04  [facility] CM/SW Contact:    Allena Katz, LCSW Phone Number: 01/16/2023, 11:00 AM   Clinical Narrative:    Transition of Care Asessment: Insurance and Status: Insurance coverage has been reviewed Patient has primary care physician: Yes Home environment has been reviewed: lives with cheryl Prior level of function:: independent Prior/Current Home Services: No current home services Social Drivers of Health Review: SDOH reviewed no interventions necessary Readmission risk has been reviewed: Yes Transition of care needs: no transition of care needs at this time

## 2023-01-29 ENCOUNTER — Ambulatory Visit
Admission: RE | Admit: 2023-01-29 | Discharge: 2023-01-29 | Disposition: A | Discharge status: Home / Self Care | Payer: Medicare PPO | Source: Ambulatory Visit | Attending: Nurse Practitioner | Admitting: Nurse Practitioner

## 2023-01-29 DIAGNOSIS — I129 Hypertensive chronic kidney disease with stage 1 through stage 4 chronic kidney disease, or unspecified chronic kidney disease: Secondary | ICD-10-CM | POA: Diagnosis not present

## 2023-01-29 DIAGNOSIS — N1831 Chronic kidney disease, stage 3a: Secondary | ICD-10-CM | POA: Diagnosis not present

## 2023-01-29 DIAGNOSIS — H2512 Age-related nuclear cataract, left eye: Secondary | ICD-10-CM | POA: Diagnosis not present

## 2023-01-29 DIAGNOSIS — G4730 Sleep apnea, unspecified: Secondary | ICD-10-CM | POA: Insufficient documentation

## 2023-01-29 DIAGNOSIS — E1122 Type 2 diabetes mellitus with diabetic chronic kidney disease: Secondary | ICD-10-CM | POA: Diagnosis not present

## 2023-01-29 DIAGNOSIS — E785 Hyperlipidemia, unspecified: Secondary | ICD-10-CM | POA: Diagnosis not present

## 2023-01-29 DIAGNOSIS — I340 Nonrheumatic mitral (valve) insufficiency: Secondary | ICD-10-CM | POA: Insufficient documentation

## 2023-01-29 LAB — ECHOCARDIOGRAM COMPLETE
AR max vel: 2.23 cm2
AV Area VTI: 2.19 cm2
AV Area mean vel: 2.29 cm2
AV Mean grad: 8 mm[Hg]
AV Peak grad: 14.4 mm[Hg]
Ao pk vel: 1.9 m/s
Area-P 1/2: 3.76 cm2
Calc EF: 56.5 %
MV VTI: 3.67 cm2
S' Lateral: 3.1 cm
Single Plane A2C EF: 61.4 %
Single Plane A4C EF: 52.6 %

## 2023-01-29 NOTE — Progress Notes (Signed)
*  PRELIMINARY RESULTS* Echocardiogram 2D Echocardiogram has been performed.  [name] 01/29/2023, 9:47 AM

## 2023-01-30 ENCOUNTER — Other Ambulatory Visit (INDEPENDENT_AMBULATORY_CARE_PROVIDER_SITE_OTHER): Payer: Medicare PPO

## 2023-01-30 DIAGNOSIS — R600 Localized edema: Secondary | ICD-10-CM

## 2023-01-30 DIAGNOSIS — I10 Essential (primary) hypertension: Secondary | ICD-10-CM | POA: Diagnosis not present

## 2023-01-30 LAB — BASIC METABOLIC PANEL
BUN: 15 mg/dL (ref 6–23)
CO2: 24 meq/L (ref 19–32)
Calcium: 9.3 mg/dL (ref 8.4–10.5)
Chloride: 108 meq/L (ref 96–112)
Creatinine, Ser: 1.23 mg/dL — ABNORMAL HIGH (ref 0.40–1.20)
GFR: 45.11 mL/min — ABNORMAL LOW (ref >60.00)
Glucose, Bld: 179 mg/dL — ABNORMAL HIGH (ref 70–99)
Potassium: 4.4 meq/L (ref 3.5–5.1)
Sodium: 140 meq/L (ref 135–145)

## 2023-01-31 ENCOUNTER — Other Ambulatory Visit: Payer: Self-pay

## 2023-01-31 ENCOUNTER — Encounter: Payer: Self-pay | Admitting: Ophthalmology

## 2023-01-31 DIAGNOSIS — S22089S Unspecified fracture of T11-T12 vertebra, sequela: Secondary | ICD-10-CM

## 2023-01-31 NOTE — Anesthesia Preprocedure Evaluation (Addendum)
 Anesthesia Evaluation  Patient identified by MRN, date of birth, ID band Patient awake    Reviewed: Allergy & Precautions, H&P , NPO status , Patient's Chart, lab work & pertinent test results, reviewed documented beta blocker date and time   Airway Mallampati: III  TM Distance: >3 FB Neck ROM: full    Dental  (+) Teeth Intact   Pulmonary shortness of breath and with exertion, sleep apnea and Continuous Positive Airway Pressure Ventilation , Patient abstained from smoking.   Pulmonary exam normal        Cardiovascular Exercise Tolerance: Poor hypertension, On Medications Normal cardiovascular exam Rhythm:regular Rate:Normal  01-29-23  1. Left ventricular ejection fraction, by estimation, is 60 to 65%. The  left ventricle has normal function. The left ventricle has no regional  wall motion abnormalities. There is mild left ventricular hypertrophy.  Left ventricular diastolic parameters  were normal.   2. Right ventricular systolic function is normal. The right ventricular  size is normal. There is normal pulmonary artery systolic pressure.   3. The mitral valve is normal in structure. Mild mitral valve  regurgitation. No evidence of mitral stenosis.   4. The aortic valve is normal in structure. Aortic valve regurgitation is  not visualized. No aortic stenosis is present.   5. The inferior vena cava is dilated in size with >50% respiratory  variability, suggesting right atrial pressure of 8 mmHg.     Neuro/Psych  PSYCHIATRIC DISORDERS      Compression fracture of L1 lumbar vertebra    GI/Hepatic Neg liver ROS, PUD,GERD  Medicated,,ulcerative colitis (s/p of colectomy and s/p ileostomy)   Endo/Other  diabetes, Well Controlled, Type 2, Oral Hypoglycemic Agents  Class 3 obesity  Renal/GU      Musculoskeletal   Abdominal   Peds  Hematology  (+) Blood dyscrasia, anemia   Anesthesia Other Findings Gall  stones  Colitis HTN (hypertension)  Obesity Bowel obstruction  Rosacea Hyperglycemia  Sleep apnea Arthritis  Gastroenteritis Dyspnea  Pneumonia Diabetes mellitus without complication Chronic kidney disease GERD (gastroesophageal reflux disease) History of kidney stones Anemia  Allergy Blood transfusion without reported diagnosis  Cataract Ileostomy in place  Cirrhosis of liver  Mild mitral regurg   Reproductive/Obstetrics negative OB ROS                             Anesthesia Physical Anesthesia Plan  ASA: 3  Anesthesia Plan: MAC   Post-op Pain Management:    Induction: Intravenous  PONV Risk Score and Plan: 2 and TIVA and Midazolam   Airway Management Planned: Natural Airway and Nasal Cannula  Additional Equipment:   Intra-op Plan:   Post-operative Plan:   Informed Consent: I have reviewed the patients History and Physical, chart, labs and discussed the procedure including the risks, benefits and alternatives for the proposed anesthesia with the patient or authorized representative who has indicated his/her understanding and acceptance.     Dental Advisory Given  Plan Discussed with: Anesthesiologist, CRNA and Surgeon  Anesthesia Plan Comments: (Patient consented for risks of anesthesia including but not limited to:  - adverse reactions to medications - damage to eyes, teeth, lips or other oral mucosa - nerve damage due to positioning  - sore throat or hoarseness - Damage to heart, brain, nerves, lungs, other parts of body or loss of life  Patient voiced understanding and assent.)        Anesthesia Quick Evaluation

## 2023-02-01 ENCOUNTER — Ambulatory Visit
Admission: RE | Admit: 2023-02-01 | Discharge: 2023-02-01 | Disposition: A | Discharge status: Home / Self Care | Payer: Medicare PPO | Attending: Neurosurgery | Admitting: Neurosurgery

## 2023-02-01 ENCOUNTER — Encounter: Payer: Self-pay | Admitting: Neurosurgery

## 2023-02-01 ENCOUNTER — Ambulatory Visit: Payer: Medicare PPO | Admitting: Neurosurgery

## 2023-02-01 ENCOUNTER — Ambulatory Visit
Admission: RE | Admit: 2023-02-01 | Discharge: 2023-02-01 | Disposition: A | Discharge status: Home / Self Care | Payer: Medicare PPO | Source: Ambulatory Visit | Attending: Neurosurgery | Admitting: Neurosurgery

## 2023-02-01 VITALS — BP 136/82 | Ht 65.0 in | Wt 255.0 lb

## 2023-02-01 DIAGNOSIS — W19XXXD Unspecified fall, subsequent encounter: Secondary | ICD-10-CM

## 2023-02-01 DIAGNOSIS — S22089A Unspecified fracture of T11-T12 vertebra, initial encounter for closed fracture: Secondary | ICD-10-CM | POA: Diagnosis not present

## 2023-02-01 DIAGNOSIS — S22088D Other fracture of T11-T12 vertebra, subsequent encounter for fracture with routine healing: Secondary | ICD-10-CM

## 2023-02-01 DIAGNOSIS — S22089S Unspecified fracture of T11-T12 vertebra, sequela: Secondary | ICD-10-CM | POA: Diagnosis not present

## 2023-02-01 DIAGNOSIS — I700 Atherosclerosis of aorta: Secondary | ICD-10-CM | POA: Diagnosis not present

## 2023-02-01 DIAGNOSIS — M4325 Fusion of spine, thoracolumbar region: Secondary | ICD-10-CM | POA: Diagnosis not present

## 2023-02-01 DIAGNOSIS — Z981 Arthrodesis status: Secondary | ICD-10-CM | POA: Diagnosis not present

## 2023-02-01 NOTE — Discharge Instructions (Signed)

## 2023-02-01 NOTE — Progress Notes (Signed)
   REFERRING PHYSICIAN:  Randeen Laine LABOR, Md [address] [place],  [place] [address]  DOS: 05/12/22 T11-L3 posterior instrumentation with ORIF of T12 and L1 fractures  HISTORY OF PRESENT ILLNESS:  02/01/2023 She is doing very well.  She has headache when she coughs.  She has some numbness in her left lateral thigh which is improving.  08/01/22 Jamie [name] is a 68 year old presenting today for 39-month follow-up of thoracolumbar fusion.  Overall she is doing very well.  She denies any significant back pain.  She does have a catching sensation in her right hip from time to time but feels this is unrelated to her thoracolumbar surgery.   06/22/22  Jamie [name] is status post T11-L3 posterior instrumentation with ORIF T12 and L1 fractures.   She is doing well.  She still has a Foley catheter.  She will be evaluated in urology next week.    PHYSICAL EXAMINATION:  General: Patient is well developed, well nourished, calm, collected, and in no apparent distress.   NEUROLOGICAL:  General: In no acute distress.   Awake, alert, oriented to person, place, and time.  Pupils equal round and reactive to light.  Facial tone is symmetric.     Strength:          Side Iliopsoas Quads Hamstring PF DF EHL  R 5 5 5 5 5 5   L 5 5 5 5 5 5    Incisions are well-healed  ROS (Neurologic):  Negative except as noted above  IMAGING: 08/01/22 lumbar xrays Without evidence of hardware malfunction  Lumbar x-rays February 01, 2023 showed no evidence of mall function  ASSESSMENT/PLAN:  Jamie [name] is doing well s/p thoracolumbar fusion.  She has done well with minimal complaints.  She is off limitations.  I have recommended that she be as active as she can tolerate.  We discussed the utility of physical therapy.  I do not think that would help with her mobility as her spine is relatively fused at this point both from her surgery and from diffuse idiopathic skeletal hyperostosis.    I will see her  back on an as-needed basis.  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

## 2023-02-06 ENCOUNTER — Other Ambulatory Visit: Payer: Self-pay

## 2023-02-06 ENCOUNTER — Ambulatory Visit
Admission: RE | Admit: 2023-02-06 | Discharge: 2023-02-06 | Disposition: A | Discharge status: Home / Self Care | Payer: Medicare PPO | Attending: Ophthalmology | Admitting: Ophthalmology

## 2023-02-06 ENCOUNTER — Ambulatory Visit: Payer: Medicare PPO | Admitting: Anesthesiology

## 2023-02-06 ENCOUNTER — Encounter
Admission: RE | Disposition: A | Discharge status: Home / Self Care | Payer: Self-pay | Source: Home / Self Care | Attending: Ophthalmology

## 2023-02-06 DIAGNOSIS — H2512 Age-related nuclear cataract, left eye: Secondary | ICD-10-CM | POA: Insufficient documentation

## 2023-02-06 DIAGNOSIS — E1122 Type 2 diabetes mellitus with diabetic chronic kidney disease: Secondary | ICD-10-CM | POA: Diagnosis not present

## 2023-02-06 DIAGNOSIS — Z932 Ileostomy status: Secondary | ICD-10-CM | POA: Diagnosis not present

## 2023-02-06 DIAGNOSIS — E66813 Obesity, class 3: Secondary | ICD-10-CM | POA: Insufficient documentation

## 2023-02-06 DIAGNOSIS — I129 Hypertensive chronic kidney disease with stage 1 through stage 4 chronic kidney disease, or unspecified chronic kidney disease: Secondary | ICD-10-CM | POA: Insufficient documentation

## 2023-02-06 DIAGNOSIS — N189 Chronic kidney disease, unspecified: Secondary | ICD-10-CM | POA: Diagnosis not present

## 2023-02-06 DIAGNOSIS — G4730 Sleep apnea, unspecified: Secondary | ICD-10-CM | POA: Insufficient documentation

## 2023-02-06 DIAGNOSIS — K219 Gastro-esophageal reflux disease without esophagitis: Secondary | ICD-10-CM | POA: Insufficient documentation

## 2023-02-06 DIAGNOSIS — H2511 Age-related nuclear cataract, right eye: Secondary | ICD-10-CM | POA: Diagnosis not present

## 2023-02-06 DIAGNOSIS — E1136 Type 2 diabetes mellitus with diabetic cataract: Secondary | ICD-10-CM | POA: Insufficient documentation

## 2023-02-06 DIAGNOSIS — Z7984 Long term (current) use of oral hypoglycemic drugs: Secondary | ICD-10-CM | POA: Diagnosis not present

## 2023-02-06 DIAGNOSIS — Z7985 Long-term (current) use of injectable non-insulin antidiabetic drugs: Secondary | ICD-10-CM | POA: Insufficient documentation

## 2023-02-06 DIAGNOSIS — Z6841 Body Mass Index (BMI) 40.0 and over, adult: Secondary | ICD-10-CM | POA: Diagnosis not present

## 2023-02-06 DIAGNOSIS — Z79899 Other long term (current) drug therapy: Secondary | ICD-10-CM | POA: Diagnosis not present

## 2023-02-06 DIAGNOSIS — N1831 Chronic kidney disease, stage 3a: Secondary | ICD-10-CM | POA: Diagnosis not present

## 2023-02-06 DIAGNOSIS — Z9049 Acquired absence of other specified parts of digestive tract: Secondary | ICD-10-CM | POA: Insufficient documentation

## 2023-02-06 HISTORY — DX: Unspecified cirrhosis of liver: K74.60

## 2023-02-06 HISTORY — DX: Ileostomy status: Z93.2

## 2023-02-06 HISTORY — DX: Nonrheumatic mitral (valve) insufficiency: I34.0

## 2023-02-06 HISTORY — PX: CATARACT EXTRACTION W/PHACO: SHX586

## 2023-02-06 LAB — GLUCOSE, CAPILLARY: Glucose-Capillary: 174 mg/dL — ABNORMAL HIGH (ref 70–99)

## 2023-02-06 SURGERY — PHACOEMULSIFICATION, CATARACT, WITH IOL INSERTION
Anesthesia: Monitor Anesthesia Care | Site: Eye | Laterality: Left

## 2023-02-06 MED ORDER — FENTANYL CITRATE (PF) 100 MCG/2ML IJ SOLN
INTRAMUSCULAR | Status: DC | PRN
  Administered 2023-02-06 (×2): 50 ug via INTRAVENOUS

## 2023-02-06 MED ORDER — TETRACAINE HCL 0.5 % OP SOLN
1.0000 [drp] | OPHTHALMIC | Status: DC | PRN
  Administered 2023-02-06 (×3): 1 [drp] via OPHTHALMIC

## 2023-02-06 MED ORDER — FENTANYL CITRATE (PF) 100 MCG/2ML IJ SOLN
INTRAMUSCULAR | Status: AC
  Filled 2023-02-06: qty 2

## 2023-02-06 MED ORDER — BRIMONIDINE TARTRATE-TIMOLOL 0.2-0.5 % OP SOLN
OPHTHALMIC | Status: DC | PRN
  Administered 2023-02-06: 1 [drp] via OPHTHALMIC

## 2023-02-06 MED ORDER — MIDAZOLAM HCL 2 MG/2ML IJ SOLN
INTRAMUSCULAR | Status: AC
  Filled 2023-02-06: qty 2

## 2023-02-06 MED ORDER — SIGHTPATH DOSE#1 BSS IO SOLN
INTRAOCULAR | Status: DC | PRN
  Administered 2023-02-06: 2 mL

## 2023-02-06 MED ORDER — CEFUROXIME OPHTHALMIC INJECTION 1 MG/0.1 ML
INJECTION | OPHTHALMIC | Status: DC | PRN
  Administered 2023-02-06: .1 mL via INTRACAMERAL

## 2023-02-06 MED ORDER — ARMC OPHTHALMIC DILATING DROPS
1.0000 | OPHTHALMIC | Status: DC | PRN
  Administered 2023-02-06 (×3): 1 via OPHTHALMIC

## 2023-02-06 MED ORDER — SIGHTPATH DOSE#1 BSS IO SOLN
INTRAOCULAR | Status: DC | PRN
  Administered 2023-02-06: 15 mL via INTRAOCULAR

## 2023-02-06 MED ORDER — SIGHTPATH DOSE#1 BSS IO SOLN
INTRAOCULAR | Status: DC | PRN
  Administered 2023-02-06: 65 mL via OPHTHALMIC

## 2023-02-06 MED ORDER — TETRACAINE HCL 0.5 % OP SOLN
OPHTHALMIC | Status: AC
  Filled 2023-02-06: qty 4

## 2023-02-06 MED ORDER — MIDAZOLAM HCL 2 MG/2ML IJ SOLN
INTRAMUSCULAR | Status: DC | PRN
  Administered 2023-02-06 (×2): 1 mg via INTRAVENOUS

## 2023-02-06 MED ORDER — ARMC OPHTHALMIC DILATING DROPS
OPHTHALMIC | Status: AC
  Filled 2023-02-06: qty 0.5

## 2023-02-06 MED ORDER — SIGHTPATH DOSE#1 NA CHONDROIT SULF-NA HYALURON 40-17 MG/ML IO SOLN
INTRAOCULAR | Status: DC | PRN
  Administered 2023-02-06: 1 mL via INTRAOCULAR

## 2023-02-06 SURGICAL SUPPLY — 17 items
ANGLE REVERSE CUT SHRT 25GA (CUTTER) ×1
CANNULA ANT/CHMB 27G (MISCELLANEOUS) IMPLANT
CANNULA ANT/CHMB 27GA (MISCELLANEOUS)
CATARACT SUITE SIGHTPATH (MISCELLANEOUS) ×1
CYSTOTOME ANGL RVRS SHRT 25G (CUTTER) ×1 IMPLANT
FEE CATARACT SUITE SIGHTPATH (MISCELLANEOUS) ×1 IMPLANT
GLOVE BIOGEL PI IND STRL 8 (GLOVE) ×1 IMPLANT
GLOVE SURG LX STRL 8.0 MICRO (GLOVE) ×1 IMPLANT
LENS CLRN VIVITY 3 8.0 ×1 IMPLANT
LENS IOL CLRN VT TRC 3 8.0 IMPLANT
NDL FILTER BLUNT 18X1 1/2 (NEEDLE) ×1 IMPLANT
NEEDLE FILTER BLUNT 18X1 1/2 (NEEDLE) ×1
PACK VIT ANT 23G (MISCELLANEOUS) IMPLANT
RING MALYGIN (MISCELLANEOUS) IMPLANT
SUT ETHILON 10-0 CS-B-6CS-B-6 (SUTURE)
SUTURE EHLN 10-0 CS-B-6CS-B-6 (SUTURE) IMPLANT
SYR 3ML LL SCALE MARK (SYRINGE) ×1 IMPLANT

## 2023-02-06 NOTE — Transfer of Care (Signed)
 Immediate Anesthesia Transfer of Care Note  Patient: Jamie [name]  Procedure(s) Performed: CATARACT EXTRACTION PHACO AND INTRAOCULAR LENS PLACEMENT (IOC) LEFT DIABETIC 5.90 00:35.9 (Left: Eye)  Patient Location: PACU  Anesthesia Type: MAC  Level of Consciousness: awake, alert  and patient cooperative  Airway and Oxygen Therapy: Patient Spontanous Breathing and Patient connected to supplemental oxygen  Post-op Assessment: Post-op Vital signs reviewed, Patient's Cardiovascular Status Stable, Respiratory Function Stable, Patent Airway and No signs of Nausea or vomiting  Post-op Vital Signs: Reviewed and stable  Complications: No notable events documented.

## 2023-02-06 NOTE — Anesthesia Postprocedure Evaluation (Signed)
 Anesthesia Post Note  Patient: Jamie [name]  Procedure(s) Performed: CATARACT EXTRACTION PHACO AND INTRAOCULAR LENS PLACEMENT (IOC) LEFT DIABETIC 5.90 00:35.9 (Left: Eye)  Patient location during evaluation: PACU Anesthesia Type: MAC Level of consciousness: awake and alert Pain management: pain level controlled Vital Signs Assessment: post-procedure vital signs reviewed and stable Respiratory status: spontaneous breathing, nonlabored ventilation, respiratory function stable and patient connected to nasal cannula oxygen Cardiovascular status: blood pressure returned to baseline and stable Postop Assessment: no apparent nausea or vomiting Anesthetic complications: no  No notable events documented.   Last Vitals:  Vitals:   02/06/23 0927 02/06/23 0932  BP: (!) 146/47 (!) 140/55  Pulse: 87 87  Resp: 20 (!) 25  Temp: (!) 36.1 C (!) 36.1 C  SpO2: 96% 95%    Last Pain:  Vitals:   02/06/23 0932  TempSrc:   PainSc: 0-No pain                 Debby Mines

## 2023-02-06 NOTE — Op Note (Signed)
 PREOPERATIVE DIAGNOSIS:  Nuclear sclerotic cataract of the left eye.   POSTOPERATIVE DIAGNOSIS:  Cataract   OPERATIVE PROCEDURE:  Procedure(s): CATARACT EXTRACTION PHACO AND INTRAOCULAR LENS PLACEMENT (IOC) LEFT DIABETIC 5.90 00:35.9   SURGEON:  Elsie Carmine, MD.   ANESTHESIA: 1.      Managed anesthesia care. 2.      Topical tetracaine  drops followed by 2% Xylocaine  jelly applied in the preoperative holding area.  Anesthesiologist: Leavy Ned, MD CRNA: Jahoo, Sonia, CRNA   COMPLICATIONS:  None.   TECHNIQUE:   Stop and chop    DESCRIPTION OF PROCEDURE:  The patient was examined and consented in the preoperative holding area where the aforementioned topical anesthesia was applied to the left eye.  The patient was brought back to the Operating Room where he was sat upright on the gurney and given a target to fixate upon while the eye was marked at the 3:00 and 9:00 position.  The patient was then reclined on the operating table.  The eye was prepped and draped in the usual sterile ophthalmic fashion and a lid speculum was placed. A paracentesis was created with the side port blade and the anterior chamber was filled with viscoelastic. A near clear corneal incision was performed with the steel keratome. A continuous curvilinear capsulorrhexis was performed with a cystotome followed by the capsulorrhexis forceps. Hydrodissection and hydrodelineation were carried out with BSS on a blunt cannula. The lens was removed in a stop and chop technique and the remaining cortical material was removed with the irrigation-aspiration handpiece. The eye was inflated with viscoelastic and the CNWET lens  was placed in the eye and rotated to within a few degrees of the predetermined orientation.  The remaining viscoelastic was removed from the eye.  The Sinskey hook was used to rotate the toric lens into its final resting place at 149 degrees.  0.1 mL of cefuroxime  concentration 10 mg/mL was placed in the  anterior chamber. The eye was inflated to a physiologic pressure and found to be watertight.  . The patient was given protective glasses to wear throughout the day and a shield with which to sleep tonight. The patient was also given drops with which to begin a drop regimen today and will follow-up with me in one day. Implant Name Type Inv. Item Serial No. Manufacturer Lot No. LRB No. Used Action  LENS CLRN VIVITY 3 8.0 - [id number]  LENS CLRN VIVITY 3 8.0 [id number] SIGHTPATH  Left 1 Implanted   Procedure(s): CATARACT EXTRACTION PHACO AND INTRAOCULAR LENS PLACEMENT (IOC) LEFT DIABETIC 5.90 00:35.9 (Left)  Electronically signed: Elsie Carmine 02/06/2023 9:26 AM

## 2023-02-06 NOTE — H&P (Signed)
 [facility]   Primary Care Physician:  Tower, Jamie LABOR, MD Ophthalmologist: Dr. Ollie  Pre-Procedure History & Physical: HPI:  Jamie [name] is [name] 68 y.o. female here for cataract surgery.   Past Medical History:  Diagnosis Date   Allergy 1970s   Sulfa  drugs   Anemia    Arthritis    generalized   Blood transfusion without reported diagnosis 1979   Bowel obstruction (HCC)    repeated (? possible adhesions) neg EGD   Cataract ~2018   Not ready for surgery yet   Chronic kidney disease    Cirrhosis of liver (HCC)    asymptomatic.  Visible on scans.   Colitis    with colectomy   Diabetes mellitus without complication (HCC)    Dyspnea    covid   Gall stones 01/23/2006   Gastroenteritis 07/25/2014   hosp for IVF    GERD (gastroesophageal reflux disease)    History of kidney stones    HTN (hypertension)    Hyperglycemia    mild-monitors A1C   Ileostomy in place [facility])    Mild mitral regurgitation by prior echocardiogram    Obesity    Pneumonia    Rosacea    Sleep apnea    CPAP everynight    Past Surgical History:  Procedure Laterality Date   APPENDECTOMY  08-1991   Along with colon removal   CHOLECYSTECTOMY     COLECTOMY  1993   has ileostomy   COLON SURGERY  2021   removed rectum   COLONOSCOPY  08/1991   COLOSTOMY REVISION  12/06/2011   Procedure: COLOSTOMY REVISION;  Surgeon: Jamie Ned, MD;  Location: [facility];  Service: General;  Laterality: Right;  OSTOMY revision   CYSTOSCOPY/URETEROSCOPY/HOLMIUM LASER/STENT PLACEMENT Left 04/11/2021   Procedure: CYSTOSCOPY/URETEROSCOPY/HOLMIUM LASER/STENT PLACEMENT;  Surgeon: Penne Knee, MD;  Location: [facility];  Service: Urology;  Laterality: Left;   EXAMINATION UNDER ANESTHESIA  12/06/2011   Procedure: EXAM UNDER ANESTHESIA;  Surgeon: Jamie Ned, MD;  Location: [facility];  Service: General;  Laterality: N/[name];  rectal exam under anesthesia, anal dilation, pouchoscopy      SPINE SURGERY  05/12/2022   2 fractured vertebra   TOTAL ABDOMINAL HYSTERECTOMY  05/2006   for abscessed ovaries    Prior to Admission medications   Medication Sig Start Date End Date Taking? Authorizing Provider  ACCU-CHEK GUIDE test strip TO CHECK GLUCOSE DAILY AND AS NEEDED FOR TYPE 2 DIABETES 02/13/22  Yes Tower, Jamie LABOR, MD  acetaminophen  (TYLENOL ) 325 MG tablet Take 650 mg by mouth every 6 (six) hours as needed for moderate pain.   Yes [provider]  atorvastatin  (LIPITOR) 10 MG tablet TAKE 1/2 PILL BY MOUTH EVERY OTHER DAY 11/16/22  Yes Tower, Jamie LABOR, MD  Blood Glucose Monitoring Suppl (ACCU-CHEK GUIDE) w/Device KIT To check glucose daily and as needed for type 2 diabetes 06/26/19  Yes Tower, Jamie LABOR, MD  Cholecalciferol  (VITAMIN D3) 25 MCG (1000 UT) CAPS Take 1,000 Units by mouth daily.   Yes [provider]  Continuous Glucose Sensor (FREESTYLE LIBRE 3 SENSOR) MISC Use as directed for diabetes type 2 requiring insulin  12/26/22  Yes Tower, Jamie LABOR, MD  Cyanocobalamin  (B-12) 1000 MCG TABS Take 1 tablet by mouth daily.   Yes [provider]  esomeprazole (NEXIUM) 20 MG capsule Take 20 mg by mouth every other day.   Yes [provider]  fluticasone  (FLONASE ) 50 MCG/ACT nasal spray Place 2 sprays into  both nostrils at bedtime.   Yes [provider]  glipiZIDE  (GLUCOTROL ) 5 MG tablet Take 5 mg by mouth daily before breakfast.   Yes [provider]  ketorolac  (ACULAR ) 0.5 % ophthalmic solution SMARTSIG:In Eye(s) 01/29/23  Yes [provider]  Lancets (ACCU-CHEK MULTICLIX) lancets To check glucose daily and as needed for diabetes type 2 06/26/19  Yes Tower, Jamie LABOR, MD  losartan  (COZAAR ) 25 MG tablet Take 25 mg by mouth daily. 02/15/21  Yes [provider]  Melatonin 1 MG CHEW Chew 1.5 mg by mouth at bedtime as needed.   Yes [provider]  metoprolol  tartrate (LOPRESSOR ) 25 MG tablet TAKE 1 TABLET BY MOUTH EVERY DAY  11/13/22  Yes Tower, Jamie LABOR, MD  Multiple Vitamin (MULTIVITAMIN) tablet Take 1 tablet by mouth daily.   Yes [provider]  NON FORMULARY SLEEPS WITH BI-PAP   Yes [provider]  prednisoLONE acetate (PRED FORTE) 1 % ophthalmic suspension SMARTSIG:In Eye(s) 01/29/23  Yes [provider]  simethicone  (MYLICON) 80 MG chewable tablet Chew 1 tablet (80 mg total) by mouth 4 (four) times daily as needed for flatulence. 05/24/22  Yes Jamie Sor A, DO  tirzepatide  (MOUNJARO ) 10 MG/0.5ML Pen Inject 10 mg into the skin once [name] week. Patient taking differently: Inject 15 mg into the skin once [name] week. 12/26/22  Yes Tower, Jamie LABOR, MD  trimethoprim -polymyxin b (POLYTRIM) ophthalmic solution SMARTSIG:In Eye(s) 01/29/23  Yes [provider]  phenazopyridine  (PYRIDIUM ) 200 MG tablet Take 1 tablet (200 mg total) by mouth 3 (three) times daily as needed for pain. 08/02/22   [name], Samantha, PA-C  zolpidem  (AMBIEN ) 10 MG tablet Take 0.5 tablets (5 mg total) by mouth at bedtime as needed for sleep. Caution of sedation and falls 07/11/22   Jamie Jamie LABOR, MD    Allergies as of 01/01/2023 - Review Complete 12/26/2022  Allergen Reaction Noted   Ciprofloxacin Rash 10/02/2012   Other Rash 07/13/2021   Jardiance  [empagliflozin ]  02/13/2020   Lisinopril  07/30/2009   Metformin  and related  05/14/2019   Sulfa  antibiotics Rash 11/08/2014    Family History  Problem Relation Age of Onset   Hypertension Mother    Hyperlipidemia Mother    Cirrhosis Mother        NASH   Diabetes Mother    Liver cancer Father        resection secondary to mets   Cancer Father        colon   Hyperlipidemia Father    Hypertension Father    Allergies Father    Ulcerative colitis Father    Arthritis Father    Allergies Brother    Gastric cancer Brother    Cancer Brother    Breast cancer Paternal Grandmother    Breast cancer Cousin 77       paternal cousins   ADD / ADHD Son     Social  History   Socioeconomic History   Marital status: Married    Spouse name: Jamie [name]   Number of children: 3   Years of education: Not on file   Highest education level: Bachelor's degree (e.g., BA, AB, BS)  Occupational History   Occupation: Guardian Ad Litem  Tobacco Use   Smoking status: Never   Smokeless tobacco: Never  Vaping Use   Vaping status: Never Used  Substance and Sexual Activity   Alcohol use: No   Drug use: Never   Sexual activity: Not Currently    Birth control/protection:  Surgical  Other Topics Concern   Not on file  Social History Narrative   Not on file   Social Drivers of Health   Financial Resource Strain: Low Risk  (12/24/2022)   Overall Financial Resource Strain (CARDIA)    Difficulty of Paying Living Expenses: Not hard at all  Food Insecurity: No Food Insecurity (01/14/2023)   Hunger Vital Sign    Worried About Running Out of Food in the Last Year: Never true    Ran Out of Food in the Last Year: Never true  Transportation Needs: No Transportation Needs (01/14/2023)   PRAPARE - Administrator, Civil Service (Medical): No    Lack of Transportation (Non-Medical): No  Physical Activity: Inactive (10/05/2022)   Exercise Vital Sign    Days of Exercise per Week: 0 days    Minutes of Exercise per Session: 0 min  Stress: No Stress Concern Present (12/24/2022)   [employer] of Occupational Health - Occupational Stress Questionnaire    Feeling of Stress : Only [name] little  Social Connections: Socially Integrated (12/24/2022)   Social Connection and Isolation Panel [NHANES]    Frequency of Communication with Friends and Family: More than three times [name] week    Frequency of Social Gatherings with Friends and Family: Three times [name] week    Attends Religious Services: More than 4 times per year    Active Member of Clubs or Organizations: Yes    Attends Banker Meetings: More than 4 times per year    Marital Status: Married  Careers Information Officer Violence: Not At Risk (01/14/2023)   Humiliation, Afraid, Rape, and Kick questionnaire    Fear of Current or Ex-Partner: No    Emotionally Abused: No    Physically Abused: No    Sexually Abused: No    Review of Systems: See HPI, otherwise negative ROS  Physical Exam: BP (!) 166/56   Pulse 88   Temp 97.9 F (36.6 C) (Temporal)   Resp 20   Ht 5' 5 (1.651 m)   Wt 117.9 kg   SpO2 96%   BMI 43.27 kg/m  General:   Alert, cooperative in NAD Head:  Normocephalic and atraumatic. Respiratory:  Normal work of breathing. Cardiovascular:  RRR  Impression/Plan: Jamie [name] is here for cataract surgery.  Risks, benefits, limitations, and alternatives regarding cataract surgery have been reviewed with the patient.  Questions have been answered.  All parties agreeable.   Elsie Carmine, MD  02/06/2023, 8:59 AM

## 2023-02-07 ENCOUNTER — Encounter: Payer: Self-pay | Admitting: Ophthalmology

## 2023-02-09 DIAGNOSIS — M25552 Pain in left hip: Secondary | ICD-10-CM | POA: Diagnosis not present

## 2023-02-09 DIAGNOSIS — M25512 Pain in left shoulder: Secondary | ICD-10-CM | POA: Diagnosis not present

## 2023-02-13 ENCOUNTER — Ambulatory Visit: Payer: Medicare PPO | Admitting: Physician Assistant

## 2023-02-13 VITALS — BP 154/79 | HR 97 | Ht 65.0 in | Wt 260.0 lb

## 2023-02-13 DIAGNOSIS — N390 Urinary tract infection, site not specified: Secondary | ICD-10-CM

## 2023-02-13 DIAGNOSIS — N3000 Acute cystitis without hematuria: Secondary | ICD-10-CM | POA: Diagnosis not present

## 2023-02-13 DIAGNOSIS — R3129 Other microscopic hematuria: Secondary | ICD-10-CM | POA: Diagnosis not present

## 2023-02-13 DIAGNOSIS — Z87440 Personal history of urinary (tract) infections: Secondary | ICD-10-CM | POA: Diagnosis not present

## 2023-02-13 LAB — URINALYSIS, COMPLETE
Bilirubin, UA: NEGATIVE
Glucose, UA: NEGATIVE
Ketones, UA: NEGATIVE
Leukocytes,UA: NEGATIVE
Nitrite, UA: NEGATIVE
Specific Gravity, UA: >1.03 — ABNORMAL HIGH (ref 1.005–1.030)
Urobilinogen, Ur: 0.2 mg/dL (ref 0.2–1.0)
pH, UA: 5.5 (ref 5.0–7.5)

## 2023-02-13 LAB — MICROSCOPIC EXAMINATION

## 2023-02-13 NOTE — Progress Notes (Signed)
02/13/2023 10:44 AM   Jamie [name] Jun 23, 1955 [id number]  CC: Chief Complaint  Patient presents with   Recurrent UTI   HPI: Jamie [name] is a 68 y.o. female with PMH urinary retention following thoracic spinal fusion who subsequently passed a voiding trial but developed recurrent versus persistent UTI who presents today for follow-up on suppressive Keflex.   Today she reports she was admitted over Christmas with viral gastroenteritis and Keflex was stopped.  She has been doing well from the urinary standpoint since stopping it and is currently asymptomatic of UTI.  She denies flank pain, nausea, or vomiting.  She had two 4 mm nonobstructing left lower pole stones on CT scan dated 08/02/2022.  In-office catheterized UA today positive for trace intact blood and 1+ protein; urine microscopy with 11-30 RBCs/HPF, hyaline and granular casts, and calcium oxalate crystals.  Measured residual 80 mL.  PMH: Past Medical History:  Diagnosis Date   Allergy 1970s   Sulfa drugs   Anemia    Arthritis    generalized   Blood transfusion without reported diagnosis 1979   Bowel obstruction (HCC)    repeated (? possible adhesions) neg EGD   Cataract ~2018   Not ready for surgery yet   Chronic kidney disease    Cirrhosis of liver (HCC)    asymptomatic.  Visible on scans.   Colitis    with colectomy   Diabetes mellitus without complication (HCC)    Dyspnea    covid   Gall stones 01/23/2006   Gastroenteritis 07/25/2014   hosp for IVF    GERD (gastroesophageal reflux disease)    History of kidney stones    HTN (hypertension)    Hyperglycemia    mild-monitors A1C   Ileostomy in place [facility])    Mild mitral regurgitation by prior echocardiogram    Obesity    Pneumonia    Rosacea    Sleep apnea    CPAP everynight    Surgical History: Past Surgical History:  Procedure Laterality Date   APPENDECTOMY  08-1991   Along with colon removal   CATARACT EXTRACTION W/PHACO Left 02/06/2023    Procedure: CATARACT EXTRACTION PHACO AND INTRAOCULAR LENS PLACEMENT (IOC) LEFT DIABETIC 5.90 00:35.9;  Surgeon: Galen Manila, MD;  Location: [facility];  Service: Ophthalmology;  Laterality: Left;   CHOLECYSTECTOMY     COLECTOMY  1993   has ileostomy   COLON SURGERY  2021   removed rectum   COLONOSCOPY  08/1991   COLOSTOMY REVISION  12/06/2011   Procedure: COLOSTOMY REVISION;  Surgeon: Romie Levee, MD;  [facility]: [facility];  Service: General;  Laterality: Right;  OSTOMY revision   CYSTOSCOPY/URETEROSCOPY/HOLMIUM LASER/STENT PLACEMENT Left 04/11/2021   Procedure: CYSTOSCOPY/URETEROSCOPY/HOLMIUM LASER/STENT PLACEMENT;  Surgeon: Vanna Scotland, MD;  Location: [facility];  Service: Urology;  Laterality: Left;   EXAMINATION UNDER ANESTHESIA  12/06/2011   Procedure: EXAM UNDER ANESTHESIA;  Surgeon: Romie Levee, MD;  Location: [facility];  Service: General;  Laterality: N/A;  rectal exam under anesthesia, anal dilation, pouchoscopy     SPINE SURGERY  05/12/2022   2 fractured vertebra   TOTAL ABDOMINAL HYSTERECTOMY  05/2006   for abscessed ovaries    Home Medications:  Allergies as of 02/13/2023       Reactions   Ciprofloxacin Rash   Erythema and pruritis around IV site, erythema and rash along vein   Other Rash   Jardiance [empagliflozin]    Increase in Cr    Lisinopril  REACTION: felt bad/ stomach hurt/ weak and tired   Metformin And Related    GI   Sulfa Antibiotics Rash        Medication List        Accurate as of February 13, 2023 10:44 AM. If you have any questions, ask your nurse or doctor.          Accu-Chek Guide test strip Generic drug: glucose blood TO CHECK GLUCOSE DAILY AND AS NEEDED FOR TYPE 2 DIABETES   Accu-Chek Guide w/Device Kit To check glucose daily and as needed for type 2 diabetes   accu-chek multiclix lancets To check glucose daily and as needed for diabetes type 2   acetaminophen 325 MG  tablet Commonly known as: TYLENOL Take 650 mg by mouth every 6 (six) hours as needed for moderate pain.   atorvastatin 10 MG tablet Commonly known as: LIPITOR TAKE 1/2 PILL BY MOUTH EVERY OTHER DAY   B-12 1000 MCG Tabs Take 1 tablet by mouth daily.   esomeprazole 20 MG capsule Commonly known as: NEXIUM Take 20 mg by mouth every other day.   fluticasone 50 MCG/ACT nasal spray Commonly known as: FLONASE Place 2 sprays into both nostrils at bedtime.   FreeStyle Libre 3 Sensor Misc Use as directed for diabetes type 2 requiring insulin   glipiZIDE 5 MG tablet Commonly known as: GLUCOTROL Take 5 mg by mouth daily before breakfast.   ketorolac 0.5 % ophthalmic solution Commonly known as: ACULAR SMARTSIG:In Eye(s)   losartan 25 MG tablet Commonly known as: COZAAR Take 25 mg by mouth daily.   Melatonin 1 MG Chew Chew 1.5 mg by mouth at bedtime as needed.   metoprolol tartrate 25 MG tablet Commonly known as: LOPRESSOR TAKE 1 TABLET BY MOUTH EVERY DAY   multivitamin tablet Take 1 tablet by mouth daily.   NON FORMULARY SLEEPS WITH BI-PAP   phenazopyridine 200 MG tablet Commonly known as: Pyridium Take 1 tablet (200 mg total) by mouth 3 (three) times daily as needed for pain.   prednisoLONE acetate 1 % ophthalmic suspension Commonly known as: PRED FORTE SMARTSIG:In Eye(s)   simethicone 80 MG chewable tablet Commonly known as: MYLICON Chew 1 tablet (80 mg total) by mouth 4 (four) times daily as needed for flatulence.   tirzepatide 10 MG/0.5ML Pen Commonly known as: MOUNJARO Inject 10 mg into the skin once a week. What changed: how much to take   trimethoprim-polymyxin b ophthalmic solution Commonly known as: POLYTRIM SMARTSIG:In Eye(s)   Vitamin D3 25 MCG (1000 UT) Caps Take 1,000 Units by mouth daily.   zolpidem 10 MG tablet Commonly known as: AMBIEN Take 0.5 tablets (5 mg total) by mouth at bedtime as needed for sleep. Caution of sedation and falls         Allergies:  Allergies  Allergen Reactions   Ciprofloxacin Rash    Erythema and pruritis around IV site, erythema and rash along vein   Other Rash   Jardiance [Empagliflozin]     Increase in Cr    Lisinopril     REACTION: felt bad/ stomach hurt/ weak and tired   Metformin And Related     GI   Sulfa Antibiotics Rash    Family History: Family History  Problem Relation Age of Onset   Hypertension Mother    Hyperlipidemia Mother    Cirrhosis Mother        NASH   Diabetes Mother    Liver cancer Father  resection secondary to mets   Cancer Father        colon   Hyperlipidemia Father    Hypertension Father    Allergies Father    Ulcerative colitis Father    Arthritis Father    Allergies Brother    Gastric cancer Brother    Cancer Brother    Breast cancer Paternal Grandmother    Breast cancer Cousin 70       paternal cousins   ADD / ADHD Son     Social History:   reports that she has never smoked. She has never used smokeless tobacco. She reports that she does not drink alcohol and does not use drugs.  Physical Exam: BP (!) 154/79   Pulse 97   Ht 5\' 5"  (1.651 m)   Wt 260 lb (117.9 kg)   BMI 43.27 kg/m   Constitutional:  Alert and oriented, no acute distress, nontoxic appearing HEENT: [place], AT Cardiovascular: No clubbing, cyanosis, or edema Respiratory: Normal respiratory effort, no increased work of breathing Skin: No rashes, bruises or suspicious lesions Neurologic: Grossly intact, no focal deficits, moving all 4 extremities Psychiatric: Normal mood and affect  Laboratory Data: Results for orders placed or performed in visit on 02/13/23  Microscopic Examination   Collection Time: 02/13/23  9:31 AM   Urine  Result Value Ref Range   WBC, UA 0-5 0 - 5 /hpf   RBC, Urine 11-30 (A) 0 - 2 /hpf   Epithelial Cells (non renal) 0-10 0 - 10 /hpf   Casts Present (A) None seen /lpf   Cast Type Hyaline casts N/A   Crystals Present (A) N/A   Crystal Type  Calcium Oxalate N/A   Mucus, UA Present (A) Not Estab.   Bacteria, UA Few None seen/Few  Urinalysis, Complete   Collection Time: 02/13/23  9:31 AM  Result Value Ref Range   Specific Gravity, UA >1.030 (H) 1.005 - 1.030   pH, UA 5.5 5.0 - 7.5   Color, UA Amber (A) Yellow   Appearance Ur Clear Clear   Leukocytes,UA Negative Negative   Protein,UA 1+ (A) Negative/Trace   Glucose, UA Negative Negative   Ketones, UA Negative Negative   RBC, UA Trace (A) Negative   Bilirubin, UA Negative Negative   Urobilinogen, Ur 0.2 0.2 - 1.0 mg/dL   Nitrite, UA Negative Negative   Microscopic Examination See below:    Assessment & Plan:   1. Recurrent UTI (Primary) Doing well off suppressive Keflex, will stay off it for now.  She continues to empty appropriately. - Urinalysis, Complete - CULTURE, URINE COMPREHENSIVE  2. Microscopic hematuria She has new microscopic hematuria and calcium oxalate crystals on cath UA today.  No symptoms to indicate acute stone episode.  We discussed that her microscopic hematuria may be due to catheterized urine sample today.  Will plan to repeat a UA with a voided sample in about 3 weeks, if clear at that time we will pursue no further workup.  If she has persistent micro heme or develops flank pain, nausea, or vomiting, will evaluate further for possible stone episode.  Return in about 3 weeks (around 03/06/2023) for Lab visit for UA.  Carman Ching, PA-C  [facility] [place] [address] [place], [place] [address] [phone]

## 2023-02-14 DIAGNOSIS — N390 Urinary tract infection, site not specified: Secondary | ICD-10-CM

## 2023-02-14 MED ORDER — FOSFOMYCIN TROMETHAMINE 3 G PO PACK: 3.0000 g | PACK | Freq: Once | ORAL | 0 refills | Status: AC

## 2023-02-15 ENCOUNTER — Other Ambulatory Visit: Payer: Self-pay | Admitting: Family Medicine

## 2023-02-16 LAB — CULTURE, URINE COMPREHENSIVE

## 2023-02-16 NOTE — Discharge Instructions (Signed)

## 2023-02-19 DIAGNOSIS — E119 Type 2 diabetes mellitus without complications: Secondary | ICD-10-CM | POA: Diagnosis not present

## 2023-02-19 DIAGNOSIS — R932 Abnormal findings on diagnostic imaging of liver and biliary tract: Secondary | ICD-10-CM | POA: Diagnosis not present

## 2023-02-19 DIAGNOSIS — G4733 Obstructive sleep apnea (adult) (pediatric): Secondary | ICD-10-CM | POA: Diagnosis not present

## 2023-02-19 DIAGNOSIS — M25512 Pain in left shoulder: Secondary | ICD-10-CM | POA: Diagnosis not present

## 2023-02-19 DIAGNOSIS — Z932 Ileostomy status: Secondary | ICD-10-CM | POA: Diagnosis not present

## 2023-02-19 DIAGNOSIS — K5190 Ulcerative colitis, unspecified, without complications: Secondary | ICD-10-CM | POA: Diagnosis not present

## 2023-02-20 ENCOUNTER — Encounter
Admission: RE | Disposition: A | Discharge status: Home / Self Care | Payer: Self-pay | Source: Home / Self Care | Attending: Ophthalmology

## 2023-02-20 ENCOUNTER — Ambulatory Visit: Payer: Medicare PPO | Admitting: Anesthesiology

## 2023-02-20 ENCOUNTER — Encounter: Payer: Self-pay | Admitting: Ophthalmology

## 2023-02-20 ENCOUNTER — Other Ambulatory Visit: Payer: Self-pay

## 2023-02-20 ENCOUNTER — Ambulatory Visit
Admission: RE | Admit: 2023-02-20 | Discharge: 2023-02-20 | Disposition: A | Discharge status: Home / Self Care | Payer: Medicare PPO | Attending: Ophthalmology | Admitting: Ophthalmology

## 2023-02-20 DIAGNOSIS — Z6841 Body Mass Index (BMI) 40.0 and over, adult: Secondary | ICD-10-CM | POA: Insufficient documentation

## 2023-02-20 DIAGNOSIS — Z7984 Long term (current) use of oral hypoglycemic drugs: Secondary | ICD-10-CM | POA: Diagnosis not present

## 2023-02-20 DIAGNOSIS — E1122 Type 2 diabetes mellitus with diabetic chronic kidney disease: Secondary | ICD-10-CM | POA: Diagnosis not present

## 2023-02-20 DIAGNOSIS — H2511 Age-related nuclear cataract, right eye: Secondary | ICD-10-CM | POA: Insufficient documentation

## 2023-02-20 DIAGNOSIS — N1831 Chronic kidney disease, stage 3a: Secondary | ICD-10-CM | POA: Diagnosis not present

## 2023-02-20 DIAGNOSIS — G4733 Obstructive sleep apnea (adult) (pediatric): Secondary | ICD-10-CM | POA: Diagnosis not present

## 2023-02-20 DIAGNOSIS — E669 Obesity, unspecified: Secondary | ICD-10-CM | POA: Diagnosis not present

## 2023-02-20 DIAGNOSIS — E1136 Type 2 diabetes mellitus with diabetic cataract: Secondary | ICD-10-CM | POA: Diagnosis not present

## 2023-02-20 DIAGNOSIS — Z932 Ileostomy status: Secondary | ICD-10-CM | POA: Diagnosis not present

## 2023-02-20 DIAGNOSIS — K219 Gastro-esophageal reflux disease without esophagitis: Secondary | ICD-10-CM | POA: Diagnosis not present

## 2023-02-20 DIAGNOSIS — I129 Hypertensive chronic kidney disease with stage 1 through stage 4 chronic kidney disease, or unspecified chronic kidney disease: Secondary | ICD-10-CM | POA: Insufficient documentation

## 2023-02-20 DIAGNOSIS — Z7985 Long-term (current) use of injectable non-insulin antidiabetic drugs: Secondary | ICD-10-CM | POA: Diagnosis not present

## 2023-02-20 DIAGNOSIS — N189 Chronic kidney disease, unspecified: Secondary | ICD-10-CM | POA: Diagnosis not present

## 2023-02-20 HISTORY — DX: Obstructive sleep apnea (adult) (pediatric): G47.33

## 2023-02-20 HISTORY — PX: CATARACT EXTRACTION W/PHACO: SHX586

## 2023-02-20 LAB — GLUCOSE, CAPILLARY: Glucose-Capillary: 124 mg/dL — ABNORMAL HIGH (ref 70–99)

## 2023-02-20 SURGERY — PHACOEMULSIFICATION, CATARACT, WITH IOL INSERTION
Anesthesia: Monitor Anesthesia Care | Site: Eye | Laterality: Right

## 2023-02-20 MED ORDER — SIGHTPATH DOSE#1 BSS IO SOLN
INTRAOCULAR | Status: DC | PRN
  Administered 2023-02-20: 2 mL

## 2023-02-20 MED ORDER — SIGHTPATH DOSE#1 BSS IO SOLN
INTRAOCULAR | Status: DC | PRN
  Administered 2023-02-20: 80 mL via OPHTHALMIC

## 2023-02-20 MED ORDER — CEFUROXIME OPHTHALMIC INJECTION 1 MG/0.1 ML
INJECTION | OPHTHALMIC | Status: DC | PRN
  Administered 2023-02-20: .1 mL via INTRACAMERAL

## 2023-02-20 MED ORDER — ARMC OPHTHALMIC DILATING DROPS
1.0000 | OPHTHALMIC | Status: DC | PRN
Start: 2023-02-20 — End: 2023-02-20
  Administered 2023-02-20 (×3): 1 via OPHTHALMIC

## 2023-02-20 MED ORDER — MIDAZOLAM HCL 2 MG/2ML IJ SOLN
INTRAMUSCULAR | Status: DC | PRN
  Administered 2023-02-20: 2 mg via INTRAVENOUS

## 2023-02-20 MED ORDER — SIGHTPATH DOSE#1 BSS IO SOLN
INTRAOCULAR | Status: DC | PRN
  Administered 2023-02-20: 15 mL via INTRAOCULAR

## 2023-02-20 MED ORDER — MIDAZOLAM HCL 2 MG/2ML IJ SOLN
INTRAMUSCULAR | Status: AC
Start: 2023-02-20 — End: ?
  Filled 2023-02-20: qty 2

## 2023-02-20 MED ORDER — ARMC OPHTHALMIC DILATING DROPS
OPHTHALMIC | Status: AC
  Filled 2023-02-20: qty 0.5

## 2023-02-20 MED ORDER — SIGHTPATH DOSE#1 NA CHONDROIT SULF-NA HYALURON 40-17 MG/ML IO SOLN
INTRAOCULAR | Status: DC | PRN
  Administered 2023-02-20: 1 mL via INTRAOCULAR

## 2023-02-20 MED ORDER — FENTANYL CITRATE (PF) 100 MCG/2ML IJ SOLN
INTRAMUSCULAR | Status: DC | PRN
  Administered 2023-02-20: 50 ug via INTRAVENOUS

## 2023-02-20 MED ORDER — FENTANYL CITRATE (PF) 100 MCG/2ML IJ SOLN
INTRAMUSCULAR | Status: AC
  Filled 2023-02-20: qty 2

## 2023-02-20 MED ORDER — BRIMONIDINE TARTRATE-TIMOLOL 0.2-0.5 % OP SOLN
OPHTHALMIC | Status: DC | PRN
  Administered 2023-02-20: 1 [drp] via OPHTHALMIC

## 2023-02-20 MED ORDER — TETRACAINE HCL 0.5 % OP SOLN
OPHTHALMIC | Status: AC
  Filled 2023-02-20: qty 4

## 2023-02-20 MED ORDER — TETRACAINE HCL 0.5 % OP SOLN
1.0000 [drp] | OPHTHALMIC | Status: DC | PRN
  Administered 2023-02-20 (×3): 1 [drp] via OPHTHALMIC

## 2023-02-20 SURGICAL SUPPLY — 14 items
CANNULA ANT/CHMB 27G (MISCELLANEOUS) IMPLANT
CANNULA ANT/CHMB 27GA (MISCELLANEOUS)
CATARACT SUITE SIGHTPATH (MISCELLANEOUS) ×1
CYSTOTOME ANG REV CUT SHRT 25G (CUTTER) ×1
CYSTOTOME ANGL RVRS SHRT 25G (CUTTER) ×1 IMPLANT
FEE CATARACT SUITE SIGHTPATH (MISCELLANEOUS) ×1 IMPLANT
GLOVE BIOGEL PI IND STRL 8 (GLOVE) ×1 IMPLANT
GLOVE SURG LX STRL 8.0 MICRO (GLOVE) ×1 IMPLANT
KNIFE OPTIMUM SIDEPORT 15DEG (MISCELLANEOUS) IMPLANT
LENS CLRN VIVITY 3 8.5 ×1 IMPLANT
LENS IOL CLRN VT TRC 3 8.5 IMPLANT
NDL FILTER BLUNT 18X1 1/2 (NEEDLE) ×1 IMPLANT
NEEDLE FILTER BLUNT 18X1 1/2 (NEEDLE) ×1
SYR 3ML LL SCALE MARK (SYRINGE) ×1 IMPLANT

## 2023-02-20 NOTE — Anesthesia Postprocedure Evaluation (Signed)
Anesthesia Post Note  Patient: Jamie [name]  Procedure(s) Performed: CATARACT EXTRACTION PHACO AND INTRAOCULAR LENS PLACEMENT (IOC) RIGHT DIABETIC 5.28 00:47.0 (Right: Eye)  Patient location during evaluation: PACU Anesthesia Type: MAC Level of consciousness: awake and alert Pain management: pain level controlled Vital Signs Assessment: post-procedure vital signs reviewed and stable Respiratory status: spontaneous breathing, nonlabored ventilation, respiratory function stable and patient connected to nasal cannula oxygen Cardiovascular status: stable and blood pressure returned to baseline Postop Assessment: no apparent nausea or vomiting Anesthetic complications: no   No notable events documented.   Last Vitals:  Vitals:   02/20/23 1043 02/20/23 1045  BP: (!) 146/98 (!) 129/50  Pulse: 78 78  Resp: 13 13  Temp: 36.8 C   SpO2: 99% 98%    Last Pain:  Vitals:   02/20/23 1045  TempSrc:   PainSc: 0-No pain                 Menucha Dicesare C Kamden Stanislaw

## 2023-02-20 NOTE — Op Note (Signed)
PREOPERATIVE DIAGNOSIS:  Nuclear sclerotic cataract of the right eye.   POSTOPERATIVE DIAGNOSIS:  Nuclear sclerotic cataract of the right eye.   OPERATIVE PROCEDURE: Procedure(s): CATARACT EXTRACTION PHACO AND INTRAOCULAR LENS PLACEMENT (IOC) RIGHT DIABETIC 5.28 00:47.0   SURGEON:  Galen Manila, MD.   ANESTHESIA: 1.      Managed anesthesia care. 2.     0.65ml of Shugarcaine was instilled following the paracentesis  Anesthesiologist: Marisue Humble, MD CRNA: Andee Poles, CRNA; Rodney Booze, CRNA  COMPLICATIONS:  None.   TECHNIQUE:   Stop and chop    DESCRIPTION OF PROCEDURE:  The patient was examined and consented in the preoperative holding area where the aforementioned topical anesthesia was applied to the right eye.  The patient was brought back to the Operating Room where he was sat upright on the gurney and given a target to fixate upon while the eye was marked at the 3:00 and 9:00 position.  The patient was then reclined on the operating table.  The eye was prepped and draped in the usual sterile ophthalmic fashion and a lid speculum was placed. A paracentesis was created with the side port blade and the anterior chamber was filled with viscoelastic. A near clear corneal incision was performed with the steel keratome. A continuous curvilinear capsulorrhexis was performed with a cystotome followed by the capsulorrhexis forceps. Hydrodissection and hydrodelineation were carried out with BSS on a blunt cannula. The lens was removed in a stop and chop technique and the remaining cortical material was removed with the irrigation-aspiration handpiece. The eye was inflated with viscoelastic and th CNWET3  lens  was placed in the eye and rotated to within a few degrees of the predetermined orientation.  The remaining viscoelastic was removed from the eye.  The Sinskey hook was used to rotate the toric lens into its final resting place at 076 degrees.  The eye was inflated to a physiologic  pressure and found to be watertight. 0.33ml of Cefuroxime was placed in the anterior chamber.  The eye was dressed with Combigan. The patient was given protective glasses to wear throughout the day and a shield with which to sleep tonight. The patient was also given drops with which to begin a drop regimen today and will follow-up with me in one day. Implant Name Type Inv. Item Serial No. Manufacturer Lot No. LRB No. Used Action  Clareon Vivity Toric IOL 8.5D Intraocular Lens  [id number] [employer]  Right 1 Implanted   Procedure(s): CATARACT EXTRACTION PHACO AND INTRAOCULAR LENS PLACEMENT (IOC) RIGHT DIABETIC 5.28 00:47.0 (Right)  Electronically signed: Galen Manila 02/20/2023 10:41 AM

## 2023-02-20 NOTE — H&P (Signed)
[facility]   Primary Care Physician:  Tower, Audrie Gallus, MD Ophthalmologist: Dr. Druscilla Brownie  Pre-Procedure History & Physical: HPI:  Jamie [name] is a 68 y.o. female here for cataract surgery.   Past Medical History:  Diagnosis Date   Allergy 1970s   Sulfa drugs   Anemia    Arthritis    generalized   Blood transfusion without reported diagnosis 1979   Bowel obstruction (HCC)    repeated (? possible adhesions) neg EGD   Cataract ~2018   Not ready for surgery yet   Chronic kidney disease    Cirrhosis of liver (HCC)    asymptomatic.  Visible on scans.   Colitis    with colectomy   Diabetes mellitus without complication (HCC)    Dyspnea    covid   Gall stones 01/23/2006   Gastroenteritis 07/25/2014   hosp for IVF    GERD (gastroesophageal reflux disease)    History of kidney stones    HTN (hypertension)    Hyperglycemia    mild-monitors A1C   Ileostomy in place [facility])    Mild mitral regurgitation by prior echocardiogram    Obesity    OSA on CPAP    Pneumonia    Rosacea    Sleep apnea    CPAP everynight    Past Surgical History:  Procedure Laterality Date   APPENDECTOMY  08-1991   Along with colon removal   CATARACT EXTRACTION W/PHACO Left 02/06/2023   Procedure: CATARACT EXTRACTION PHACO AND INTRAOCULAR LENS PLACEMENT (IOC) LEFT DIABETIC 5.90 00:35.9;  Surgeon: Galen Manila, MD;  Location: [facility];  Service: Ophthalmology;  Laterality: Left;   CHOLECYSTECTOMY     COLECTOMY  1993   has ileostomy   COLON SURGERY  2021   removed rectum   COLONOSCOPY  08/1991   COLOSTOMY REVISION  12/06/2011   Procedure: COLOSTOMY REVISION;  Surgeon: Romie Levee, MD;  Location: [facility];  Service: General;  Laterality: Right;  OSTOMY revision   CYSTOSCOPY/URETEROSCOPY/HOLMIUM LASER/STENT PLACEMENT Left 04/11/2021   Procedure: CYSTOSCOPY/URETEROSCOPY/HOLMIUM LASER/STENT PLACEMENT;  Surgeon: Vanna Scotland, MD;  Location: [facility];  Service:  Urology;  Laterality: Left;   EXAMINATION UNDER ANESTHESIA  12/06/2011   Procedure: EXAM UNDER ANESTHESIA;  Surgeon: Romie Levee, MD;  Location[facility];  Service: General;  Laterality: N/A;  rectal exam under anesthesia, anal dilation, pouchoscopy     SPINE SURGERY  05/12/2022   2 fractured vertebra   TOTAL ABDOMINAL HYSTERECTOMY  05/2006   for abscessed ovaries    Prior to Admission medications   Medication Sig Start Date End Date Taking? Authorizing Provider  acetaminophen (TYLENOL) 325 MG tablet Take 650 mg by mouth every 6 (six) hours as needed for moderate pain.   Yes [provider]  atorvastatin (LIPITOR) 10 MG tablet TAKE 1/2 PILL BY MOUTH EVERY OTHER DAY 11/16/22  Yes Tower, Audrie Gallus, MD  Cholecalciferol (VITAMIN D3) 25 MCG (1000 UT) CAPS Take 1,000 Units by mouth daily.   Yes [provider]  Cyanocobalamin (B-12) 1000 MCG TABS Take 1 tablet by mouth daily.   Yes [provider]  esomeprazole (NEXIUM) 20 MG capsule Take 20 mg by mouth every other day.   Yes [provider]  fluticasone (FLONASE) 50 MCG/ACT nasal spray Place 2 sprays into both nostrils at bedtime.   Yes [provider]  glipiZIDE (GLUCOTROL) 5 MG tablet Take 5 mg by mouth daily before breakfast.   Yes [provider]  ketorolac (ACULAR) 0.5 % ophthalmic solution SMARTSIG:In Eye(s) 01/29/23  Yes [provider]  losartan (COZAAR) 25 MG tablet Take 25 mg by mouth daily. 02/15/21  Yes [provider]  Melatonin 1 MG CHEW Chew 1.5 mg by mouth at bedtime as needed.   Yes [provider]  metoprolol tartrate (LOPRESSOR) 25 MG tablet TAKE 1 TABLET BY MOUTH EVERY DAY 02/15/23  Yes Tower, Audrie Gallus, MD  Multiple Vitamin (MULTIVITAMIN) tablet Take 1 tablet by mouth daily.   Yes [provider]  NON FORMULARY SLEEPS WITH BI-PAP   Yes [provider]  phenazopyridine (PYRIDIUM) 200 MG tablet Take 1 tablet (200 mg  total) by mouth 3 (three) times daily as needed for pain. 08/02/22  Yes Vaillancourt, Lelon Mast, PA-C  prednisoLONE acetate (PRED FORTE) 1 % ophthalmic suspension SMARTSIG:In Eye(s) 01/29/23  Yes [provider]  simethicone (MYLICON) 80 MG chewable tablet Chew 1 tablet (80 mg total) by mouth 4 (four) times daily as needed for flatulence. 05/24/22  Yes Esaw Grandchild A, DO  tirzepatide [facility]) 10 MG/0.5ML Pen Inject 10 mg into the skin once a week. Patient taking differently: Inject 15 mg into the skin once a week. 12/26/22  Yes Tower, Audrie Gallus, MD  trimethoprim-polymyxin b (POLYTRIM) ophthalmic solution SMARTSIG:In Eye(s) 01/29/23  Yes [provider]  zolpidem (AMBIEN) 10 MG tablet Take 0.5 tablets (5 mg total) by mouth at bedtime as needed for sleep. Caution of sedation and falls 07/11/22  Yes Tower, Audrie Gallus, MD  ACCU-CHEK GUIDE test strip TO CHECK GLUCOSE DAILY AND AS NEEDED FOR TYPE 2 DIABETES 02/13/22   Tower, Audrie Gallus, MD  Blood Glucose Monitoring Suppl (ACCU-CHEK GUIDE) w/Device KIT To check glucose daily and as needed for type 2 diabetes 06/26/19   Tower, Audrie Gallus, MD  Continuous Glucose Sensor (FREESTYLE LIBRE 3 SENSOR) MISC Use as directed for diabetes type 2 requiring insulin 12/26/22   Tower, Audrie Gallus, MD  Lancets (ACCU-CHEK MULTICLIX) lancets To check glucose daily and as needed for diabetes type 2 06/26/19   Tower, Audrie Gallus, MD    Allergies as of 01/01/2023 - Review Complete 12/26/2022  Allergen Reaction Noted   Ciprofloxacin Rash 10/02/2012   Other Rash 07/13/2021   Jardiance [empagliflozin]  02/13/2020   Lisinopril  07/30/2009   Metformin and related  05/14/2019   Sulfa antibiotics Rash 11/08/2014    Family History  Problem Relation Age of Onset   Hypertension Mother    Hyperlipidemia Mother    Cirrhosis Mother        NASH   Diabetes Mother    Liver cancer Father        resection secondary to mets   Cancer Father        colon   Hyperlipidemia Father     Hypertension Father    Allergies Father    Ulcerative colitis Father    Arthritis Father    Allergies Brother    Gastric cancer Brother    Cancer Brother    Breast cancer Paternal Grandmother    Breast cancer Cousin 73       paternal cousins   ADD / ADHD Son     Social History   Socioeconomic History   Marital status: Married    Spouse name: [name]   Number of children: 3   Years of education: Not on file   Highest education level: Bachelor's degree (e.g., BA, AB, BS)  Occupational History   Occupation: Guardian Ad Litem  Tobacco Use  Smoking status: Never   Smokeless tobacco: Never  Vaping Use   Vaping status: Never Used  Substance and Sexual Activity   Alcohol use: No   Drug use: Never   Sexual activity: Not Currently    Birth control/protection: Surgical  Other Topics Concern   Not on file  Social History Narrative   Not on file   Social Drivers of Health   Financial Resource Strain: Low Risk  (12/24/2022)   Overall Financial Resource Strain (CARDIA)    Difficulty of Paying Living Expenses: Not hard at all  Food Insecurity: No Food Insecurity (01/14/2023)   Hunger Vital Sign    Worried About Running Out of Food in the Last Year: Never true    Ran Out of Food in the Last Year: Never true  Transportation Needs: No Transportation Needs (01/14/2023)   PRAPARE - Administrator, Civil Service (Medical): No    Lack of Transportation (Non-Medical): No  Physical Activity: Inactive (10/05/2022)   Exercise Vital Sign    Days of Exercise per Week: 0 days    Minutes of Exercise per Session: 0 min  Stress: No Stress Concern Present (12/24/2022)   [employer] of Occupational Health - Occupational Stress Questionnaire    Feeling of Stress : Only a little  Social Connections: Socially Integrated (12/24/2022)   Social Connection and Isolation Panel [NHANES]    Frequency of Communication with Friends and Family: More than three times a week    Frequency of  Social Gatherings with Friends and Family: Three times a week    Attends Religious Services: More than 4 times per year    Active Member of Clubs or Organizations: Yes    Attends Banker Meetings: More than 4 times per year    Marital Status: Married  Catering manager Violence: Not At Risk (01/14/2023)   Humiliation, Afraid, Rape, and Kick questionnaire    Fear of Current or Ex-Partner: No    Emotionally Abused: No    Physically Abused: No    Sexually Abused: No    Review of Systems: See HPI, otherwise negative ROS  Physical Exam: BP (!) 121/52   Pulse 80   Temp 98.4 F (36.9 C) (Temporal)   Resp 16   Ht 5\' 5"  (1.651 m)   Wt 117 kg   SpO2 100%   BMI 42.93 kg/m  General:   Alert, cooperative in NAD Head:  Normocephalic and atraumatic. Respiratory:  Normal work of breathing. Cardiovascular:  RRR  Impression/Plan: Jamie [name] is here for cataract surgery.  Risks, benefits, limitations, and alternatives regarding cataract surgery have been reviewed with the patient.  Questions have been answered.  All parties agreeable.   Galen Manila, MD  02/20/2023, 10:08 AM

## 2023-02-20 NOTE — Transfer of Care (Signed)
Immediate Anesthesia Transfer of Care Note  Patient: Jamie [name]  Procedure(s) Performed: CATARACT EXTRACTION PHACO AND INTRAOCULAR LENS PLACEMENT (IOC) RIGHT DIABETIC 5.28 00:47.0 (Right: Eye)  Patient Location: PACU  Anesthesia Type: MAC  Level of Consciousness: awake, alert  and patient cooperative  Airway and Oxygen Therapy: Patient Spontanous Breathing and Patient connected to supplemental oxygen  Post-op Assessment: Post-op Vital signs reviewed, Patient's Cardiovascular Status Stable, Respiratory Function Stable, Patent Airway and No signs of Nausea or vomiting  Post-op Vital Signs: Reviewed and stable  Complications: No notable events documented.

## 2023-02-20 NOTE — Anesthesia Preprocedure Evaluation (Addendum)
Anesthesia Evaluation    Airway Mallampati: III  TM Distance: >3 FB Neck ROM: Full    Dental no notable dental hx.    Pulmonary shortness of breath, sleep apnea and Continuous Positive Airway Pressure Ventilation , pneumonia   Pulmonary exam normal breath sounds clear to auscultation       Cardiovascular hypertension, Normal cardiovascular exam Rhythm:Regular Rate:Normal  01-29-23 1. Left ventricular ejection fraction, by estimation, is 60 to 65%. The  left ventricle has normal function. The left ventricle has no regional  wall motion abnormalities. There is mild left ventricular hypertrophy.  Left ventricular diastolic parameters  were normal.   2. Right ventricular systolic function is normal. The right ventricular  size is normal. There is normal pulmonary artery systolic pressure.   3. The mitral valve is normal in structure. Mild mitral valve  regurgitation. No evidence of mitral stenosis.   4. The aortic valve is normal in structure. Aortic valve regurgitation is  not visualized. No aortic stenosis is present.   5. The inferior vena cava is dilated in size with >50% respiratory  variability, suggesting right atrial pressure of 8 mmHg.     Neuro/Psych  PSYCHIATRIC DISORDERS Anxiety     Fx L4    GI/Hepatic PUD,GERD  ,,ulcerative colitis (s/p of colectomy and s/p ileostomy)   Endo/Other  diabetes    Renal/GU Renal disease     Musculoskeletal  (+) Arthritis ,    Abdominal   Peds  Hematology  (+) Blood dyscrasia, anemia   Anesthesia Other Findings Previous cataract surgery 02-06-23 had 1 mg versed IV and 50 mcg fentanyl IV  Gall stones  Colitis HTN (hypertension)  Obesity Bowel obstruction (HCC) Rosacea Hyperglycemia  Sleep apnea Arthritis  Gastroenteritis Dyspnea  Pneumonia Diabetes mellitus without complication (HCC) Chronic kidney disease GERD (gastroesophageal reflux disease) History of kidney  stones Anemia  Allergy Blood transfusion without reported diagnosis  Cataract Ileostomy in place [facility])  Cirrhosis of liver (HCC) Mild mitral regurgitation by prior echocardiogram  OSA CPAP Fx L4   Reproductive/Obstetrics                              Anesthesia Physical Anesthesia Plan  ASA: 3  Anesthesia Plan: MAC   Post-op Pain Management:    Induction: Intravenous  PONV Risk Score and Plan:   Airway Management Planned: Natural Airway and Nasal Cannula  Additional Equipment:   Intra-op Plan:   Post-operative Plan:   Informed Consent: I have reviewed the patients History and Physical, chart, labs and discussed the procedure including the risks, benefits and alternatives for the proposed anesthesia with the patient or authorized representative who has indicated his/her understanding and acceptance.     Dental Advisory Given  Plan Discussed with: Anesthesiologist, CRNA and Surgeon  Anesthesia Plan Comments: (Patient consented for risks of anesthesia including but not limited to:  - adverse reactions to medications - damage to eyes, teeth, lips or other oral mucosa - nerve damage due to positioning  - sore throat or hoarseness - Damage to heart, brain, nerves, lungs, other parts of body or loss of life  Patient voiced understanding and assent.)         Anesthesia Quick Evaluation

## 2023-02-23 ENCOUNTER — Encounter: Payer: Self-pay | Admitting: Ophthalmology

## 2023-03-05 DIAGNOSIS — K760 Fatty (change of) liver, not elsewhere classified: Secondary | ICD-10-CM | POA: Diagnosis not present

## 2023-03-05 DIAGNOSIS — Z1159 Encounter for screening for other viral diseases: Secondary | ICD-10-CM | POA: Diagnosis not present

## 2023-03-05 DIAGNOSIS — Z7985 Long-term (current) use of injectable non-insulin antidiabetic drugs: Secondary | ICD-10-CM | POA: Diagnosis not present

## 2023-03-05 DIAGNOSIS — R932 Abnormal findings on diagnostic imaging of liver and biliary tract: Secondary | ICD-10-CM | POA: Diagnosis not present

## 2023-03-07 ENCOUNTER — Encounter: Payer: Self-pay | Admitting: Internal Medicine

## 2023-03-07 ENCOUNTER — Ambulatory Visit: Payer: Medicare PPO | Admitting: Internal Medicine

## 2023-03-07 VITALS — BP 124/80 | HR 89 | Temp 97.6°F | Ht 65.0 in | Wt 261.0 lb

## 2023-03-07 DIAGNOSIS — G4733 Obstructive sleep apnea (adult) (pediatric): Secondary | ICD-10-CM

## 2023-03-07 DIAGNOSIS — M25512 Pain in left shoulder: Secondary | ICD-10-CM | POA: Diagnosis not present

## 2023-03-07 NOTE — Progress Notes (Signed)
[facility] Pulmonary Medicine Consultation     Synopsis Establish care for sleep apnea several years ago  **CPAP download data 11/28/2017-12/28/2018>> raw data personally reviewed, she greater than 4 hours is 19/30 days (63%).  Average usage on days used for 33 minutes.  Pressure is 19/10.  Residual AHI is 5.9 leaks are minimal. **Review of 30 days of download data as of 12/21/16; usage is 30/30 days.  Average usage on days used is 5 hours 51 minutes.  Current settings are her curve 10 V auto; BiPAP 19/10 residual AHI is 3.2.  -home sleep study from the December 20th 2012:AHI 37.  CPAP download January 2023 87 (compliance for days 57 compliance for greater than 4 hours AHI reduced to 2.5 BiPAP IPAP 19 EPAP 10   CC Follow-up assessment for OSA   HPI:   On BiPAP full facemask  Previous AHI was 37 current AHI  Patient had facial trauma Unable to wear mask over the last 3 to 4 weeks Prior download showed excellent compliance  No exacerbation at this time No evidence of heart failure at this time No evidence or signs of infection at this time No respiratory distress No fevers, chills, nausea, vomiting, diarrhea No evidence of lower extremity edema No evidence hemoptysis   Patient does have ileostomy that she takes care of at night  BP 124/80 (BP Location: Right Wrist, Patient Position: Sitting, Cuff Size: Normal)   Pulse 89   Temp 97.6 F (36.4 C) (Temporal)   Ht 5\' 5"  (1.651 m)   Wt 261 lb (118.4 kg)   SpO2 99%   BMI 43.43 kg/m       Review of Systems: Gen:  Denies  fever, sweats, chills weight loss  HEENT: Denies blurred vision, double vision, ear pain, eye pain, hearing loss, nose bleeds, sore throat Cardiac:  No dizziness, chest pain or heaviness, chest tightness,edema, No JVD Resp:   No cough, -sputum production, -shortness of breath,-wheezing, -hemoptysis,  Other:  All other systems negative   Physical Examination:   General Appearance: No distress   left facial bruising and swelling, tenderness to touch EYES PERRLA, EOM intact.   NECK Supple, No JVD Pulmonary: normal breath sounds, No wheezing.  CardiovascularNormal S1,S2.  No m/r/g.   Abdomen: Benign, Soft, non-tender. Neurology UE/LE 5/5 strength, no focal deficits Ext pulses intact, cap refill intact ALL OTHER ROS ARE NEGATIVE      Assessment and Plan:  68 year old pleasant white female seen today for assessment and diagnosis of SEVERE OSA  Assessment of Sleep apnea Discussed in detail with patient OSA is well-controlled with BiPAP Continue current prescription BiPAP IPAP 19 EPAP 10 BENEFITS FROM THERAPY Unable to wear mask at this time due to facial trauma Patient will take Tylenol as needed and placed the mask    Patient Instructions Continue to use CPAP every night, minimum of 4-6 hours a night.  Change equipment every 30 days or as directed by DME.  Wash your tubing with warm soap and water daily, hang to dry. Wash humidifier portion weekly. Use bottled, distilled water and change daily   Be aware of reduced alertness and do not drive or operate heavy machinery if experiencing this or drowsiness.  Exercise encouraged, as tolerated. Encouraged proper weight management.  Important to get eight or more hours of sleep  Limiting the use of the computer and television before bedtime.  Decrease naps during the day, so night time sleep will become enhanced.  Limit caffeine, and sleep deprivation.  HTN, stroke, uncontrolled diabetes and heart failure are potential risk factors.  Risk of untreated sleep apnea including cardiac arrhthymias, stroke, DM, pulm HTN.   Obesity -recommend significant weight loss -recommend changing diet  Deconditioned state -Recommend increased daily activity and exercise   MEDICATION ADJUSTMENTS/LABS AND TESTS ORDERED: Continue CPAP as prescribed Avoid Allergens and Irritants Avoid secondhand smoke Avoid SICK contacts Recommend   Masking  when appropriate Recommend Keep up-to-date with vaccinations   CURRENT MEDICATIONS REVIEWED AT LENGTH WITH PATIENT TODAY   Patient  satisfied with Plan of action and management. All questions answered   Follow up  6 months   I spent a total of 43  minutes reviewing chart data, face-to-face evaluation with the patient, counseling and coordination of care as detailed above.      [name], M.D.  [facility]  Medical Director [facility] Medical Director [facility] Cardio-Pulmonary Department

## 2023-03-07 NOTE — Patient Instructions (Addendum)
   Continue CPAP as prescribed  Avoid Allergens and Irritants Avoid secondhand smoke Avoid SICK contacts Recommend  Masking  when appropriate Recommend Keep up-to-date with vaccinations   Be aware of reduced alertness and do not drive or operate heavy machinery if experiencing this or drowsiness.  Exercise encouraged, as tolerated. Encouraged proper weight management.  Important to get eight or more hours of sleep  Limiting the use of the computer and television before bedtime.  Decrease naps during the day, so night time sleep will become enhanced.  Limit caffeine, and sleep deprivation.

## 2023-03-16 ENCOUNTER — Other Ambulatory Visit: Payer: Medicare PPO

## 2023-03-16 ENCOUNTER — Other Ambulatory Visit: Payer: Self-pay | Admitting: *Deleted

## 2023-03-16 DIAGNOSIS — N390 Urinary tract infection, site not specified: Secondary | ICD-10-CM

## 2023-03-16 DIAGNOSIS — M75102 Unspecified rotator cuff tear or rupture of left shoulder, not specified as traumatic: Secondary | ICD-10-CM | POA: Diagnosis not present

## 2023-03-16 LAB — URINALYSIS, COMPLETE
Bilirubin, UA: NEGATIVE
Glucose, UA: NEGATIVE
Nitrite, UA: NEGATIVE
RBC, UA: NEGATIVE
Specific Gravity, UA: >1.03 — ABNORMAL HIGH (ref 1.005–1.030)
Urobilinogen, Ur: 0.2 mg/dL (ref 0.2–1.0)
pH, UA: 5.5 (ref 5.0–7.5)

## 2023-03-16 LAB — MICROSCOPIC EXAMINATION

## 2023-03-22 ENCOUNTER — Encounter: Payer: Self-pay | Admitting: Family Medicine

## 2023-03-22 MED ORDER — TIRZEPATIDE 15 MG/0.5ML ~~LOC~~ SOAJ: 15.0000 mg | SUBCUTANEOUS | 0 refills | Status: DC

## 2023-03-23 DIAGNOSIS — M75102 Unspecified rotator cuff tear or rupture of left shoulder, not specified as traumatic: Secondary | ICD-10-CM | POA: Diagnosis not present

## 2023-03-29 DIAGNOSIS — M25512 Pain in left shoulder: Secondary | ICD-10-CM | POA: Diagnosis not present

## 2023-04-12 DIAGNOSIS — M25512 Pain in left shoulder: Secondary | ICD-10-CM | POA: Diagnosis not present

## 2023-04-13 ENCOUNTER — Telehealth: Admitting: Family Medicine

## 2023-04-13 DIAGNOSIS — N3000 Acute cystitis without hematuria: Secondary | ICD-10-CM

## 2023-04-13 MED ORDER — NITROFURANTOIN MONOHYD MACRO 100 MG PO CAPS: 100.0000 mg | ORAL_CAPSULE | Freq: Two times a day (BID) | ORAL | 0 refills | Status: AC

## 2023-04-13 NOTE — Progress Notes (Signed)
 E-Visit for Urinary Problems  We are sorry that you are not feeling well.  Here is how we plan to help!  Based on what you shared with me it looks like you most likely have a simple urinary tract infection.  A UTI (Urinary Tract Infection) is a bacterial infection of the bladder.  Most cases of urinary tract infections are simple to treat but a key part of your care is to encourage you to drink plenty of fluids and watch your symptoms carefully.  I have prescribed MacroBid 100 mg twice a day for 5 days.  Your symptoms should gradually improve. Call us if the burning in your urine worsens, you develop worsening fever, back pain or pelvic pain or if your symptoms do not resolve after completing the antibiotic.  Urinary tract infections can be prevented by drinking plenty of water to keep your body hydrated.  Also be sure when you wipe, wipe from front to back and don't hold it in!  If possible, empty your bladder every 4 hours.  HOME CARE Drink plenty of fluids Compete the full course of the antibiotics even if the symptoms resolve Remember, when you need to go.go. Holding in your urine can increase the likelihood of getting a UTI! GET HELP RIGHT AWAY IF: You cannot urinate You get a high fever Worsening back pain occurs You see blood in your urine You feel sick to your stomach or throw up You feel like you are going to pass out  MAKE SURE YOU  Understand these instructions. Will watch your condition. Will get help right away if you are not doing well or get worse.   Thank you for choosing an e-visit.  Your e-visit answers were reviewed by a board certified advanced clinical practitioner to complete your personal care plan. Depending upon the condition, your plan could have included both over the counter or prescription medications.  Please review your pharmacy choice. Make sure the pharmacy is open so you can pick up prescription now. If there is a problem, you may contact your  provider through [employer] and have the prescription routed to another pharmacy.  Your safety is important to [place]. If you have drug allergies check your prescription carefully.   For the next 24 hours you can use MyChart to ask questions about today's visit, request a non-urgent call back, or ask for a work or school excuse. You will get an email in the next two days asking about your experience. I hope that your e-visit has been valuable and will speed your recovery.    have provided 5 minutes of non face to face time during this encounter for chart review and documentation.

## 2023-04-16 ENCOUNTER — Encounter: Payer: Self-pay | Admitting: Family Medicine

## 2023-04-16 ENCOUNTER — Ambulatory Visit: Admitting: Family Medicine

## 2023-04-16 VITALS — BP 118/70 | HR 69 | Temp 97.6°F | Ht 65.0 in | Wt 262.1 lb

## 2023-04-16 DIAGNOSIS — N183 Chronic kidney disease, stage 3 unspecified: Secondary | ICD-10-CM

## 2023-04-16 DIAGNOSIS — Z7985 Long-term (current) use of injectable non-insulin antidiabetic drugs: Secondary | ICD-10-CM

## 2023-04-16 DIAGNOSIS — E1122 Type 2 diabetes mellitus with diabetic chronic kidney disease: Secondary | ICD-10-CM

## 2023-04-16 DIAGNOSIS — S0993XS Unspecified injury of face, sequela: Secondary | ICD-10-CM | POA: Insufficient documentation

## 2023-04-16 DIAGNOSIS — R932 Abnormal findings on diagnostic imaging of liver and biliary tract: Secondary | ICD-10-CM | POA: Diagnosis not present

## 2023-04-16 DIAGNOSIS — E119 Type 2 diabetes mellitus without complications: Secondary | ICD-10-CM

## 2023-04-16 LAB — POCT GLYCOSYLATED HEMOGLOBIN (HGB A1C): Hemoglobin A1C: 7.2 % — AB (ref 4.0–5.6)

## 2023-04-16 NOTE — Assessment & Plan Note (Signed)
 Under care of nephrology  Most recent GFR 45.11 K 4.4

## 2023-04-16 NOTE — Assessment & Plan Note (Signed)
 Pt has had hepatology visit  Saw nutritionist and working on diet / less carbs and fat and more protein  Has found this helpful  Encouraged to work on muscle building exercise if able

## 2023-04-16 NOTE — Assessment & Plan Note (Addendum)
 Pt fell onto left side of face in late jan (after cataract surgery when she could not see well on stairs)  Still some swelling and tenderness over cheek bone , TMJ and upper mandible No congestion or swallowing problems  Will get maxillofacial CT Advised cool compress prn

## 2023-04-16 NOTE — Patient Instructions (Addendum)
 I put the referral in for maxillofacial CT  Please let us know if you don't hear in 1-2 weeks   Use cool compress if needed  If symptoms worsen let me know   A1c today  Stay off the glipizide xl for now  Do your best to eat high protein and low fat and carb  Try to get most of your carbohydrates from produce (with the exception of white potatoes) and whole grains Eat less bread/pasta/rice/snack foods/cereals/sweets and other items from the middle of the grocery store (processed carbs)  Do strength building exercise to build muscle when you can  Add some strength training to your routine, this is important for bone and brain health and can reduce your risk of falls and help your body use insulin properly and regulate weight  Light weights, exercise bands , and internet videos are a good way to start  Yoga (chair or regular), machines , floor exercises or a gym with machines are also good options

## 2023-04-16 NOTE — Assessment & Plan Note (Signed)
 Lab Results  Component Value Date   HGBA1C 7.2 (A) 04/16/2023   HGBA1C 7.3 (A) 12/26/2022   HGBA1C 6.9 (H) 10/05/2022   Stable  On moujaro -starting 15 mg dose  Holding glipizide 5 mg in light of some low readings  Had a steroid shot  Per pt has watched diet some for past 4-5 weeks and this A1c represented past 3 months  Encouraged low glycemic diet and exercise  Plan follow up in 3 months  Will continue to hold glipizide

## 2023-04-16 NOTE — Progress Notes (Signed)
 Subjective:    Patient ID: Jamie [name], female    DOB: 1955-03-30, 68 y.o.   MRN: [id number]  HPI  Wt Readings from Last 3 Encounters:  04/16/23 262 lb 2 oz (118.9 kg)  03/07/23 261 lb (118.4 kg)  02/20/23 258 lb (117 kg)   43.62 kg/m  Vitals:   04/16/23 1421  BP: 118/70  Pulse: 69  Temp: 97.6 F (36.4 C)  SpO2: 97%   Pt presents for facial injury from a fall  Also follow up DM2 and CKD On macrobid for uti from last week/ doing better now    Had cataract surgery at end of January  Depth perception was affected  Sheran Lawless on left side (down stairs at church and landed on carpet) - whole body hurt on that side/was sore   Ended up with rotator cuff on left - is in PT  Hesitant to do surgery  Hip was fine   Face continues to bother her (no imaging but had exam from her eye doctor) The eye was not affected  Bruising went from under eye/ cheek bone and into her neck (had pic on cell phone) Still feels swelling /numbness and soreness on that side of her face  No congestion  Discomfort chewing was only 2 days - better now /did see dentist  Did not cause headaches   Did put ice on face initialy  Has not needed to take anything      DM2 Lab Results  Component Value Date   HGBA1C 7.2 (A) 04/16/2023   HGBA1C 7.3 (A) 12/26/2022   HGBA1C 6.9 (H) 10/05/2022   Glipizide 5 mg daily -stopped/ forgot to put in pill box /unsure if she needs it   Mounaro 15 mg weekly - tolerates it fine  Thinks it does help her appetite   Eye exam -recent   Sees renal for kidney care  Saw hepatology and also nutritionist  Is eating more protein now and feels better  Working on that  Doing premier protein shakes   Glucose was up for 1.5 weeks after steroid shot for shoulder  Sugar has been improved for 4-5 weeks out of 3 months   Levels -avg around 150  Ups and downs  Did have some low glucose readings    Lab Results  Component Value Date   NA 140 01/30/2023   K  4.4 01/30/2023   CO2 24 01/30/2023   GLUCOSE 179 (H) 01/30/2023   BUN 15 01/30/2023   CREATININE 1.23 (H) 01/30/2023   CALCIUM 9.3 01/30/2023   GFR 45.11 (L) 01/30/2023   GFRNONAA 39 (L) 01/16/2023    Patient Active Problem List   Diagnosis Date Noted   Facial trauma, sequela 04/16/2023   Diabetes mellitus treated with injections of non-insulin medication (HCC) 12/17/2022   Pedal edema 12/15/2022   Abnormal finding on imaging of liver 10/12/2022   Long-term current use of injectable noninsulin antidiabetic medication 10/12/2022   Panic anxiety syndrome 07/20/2022   Encounter for screening mammogram for breast cancer 07/11/2022   Estrogen deficiency 07/11/2022   Low serum vitamin B12 07/04/2022   Current use of proton pump inhibitor 07/03/2022   Low magnesium level 06/06/2022   Closed tricolumnar fracture of lumbar vertebra (HCC) 05/12/2022   Compression fracture of L1 lumbar vertebra (HCC) 05/11/2022   Morbid obesity (HCC) 05/11/2022   Fall at home, initial encounter 05/11/2022   Chronic kidney disease, stage 3a (HCC) 05/11/2022   Closed fracture of first lumbar vertebra (  HCC) 05/11/2022   Closed fracture of twelfth thoracic vertebra (HCC) 05/11/2022   Spinal instability, lumbar 05/11/2022   Hearing loss, right 04/07/2022   Abnormal SPEP 04/28/2021   Abnormal EKG 04/26/2021   CKD stage 3 due to type 2 diabetes mellitus (HCC) 05/31/2020   Aortic atherosclerosis (HCC) 03/01/2020   Elevated serum creatinine 05/14/2019   Dermatitis 07/01/2018   Left shoulder pain 11/30/2017   Palpitations 07/05/2017   Lichen planus 04/09/2017   Hyperlipidemia associated with type 2 diabetes mellitus (HCC) 04/10/2016   Red blood cell antibody positive 07/02/2013   Elevated C-reactive protein (CRP) 05/15/2012   Routine general medical examination at a health care facility 09/24/2011   Apnea 11/14/2010   Stress reaction, emotional 05/02/2010   Compulsive overeater 05/02/2010   Arthritis     H/O small bowel obstruction    KNEE PAIN 10/14/2009   DM (diabetes mellitus), type 2 with renal complications (HCC) 04/23/2009   ROSACEA 09/12/2007   Essential hypertension 05/15/2007   OSA (obstructive sleep apnea) 12/13/2006   Ulcerative colitis (HCC) 07/12/2006   Past Medical History:  Diagnosis Date   Allergy 1970s   Sulfa drugs   Anemia    Arthritis    generalized   Blood transfusion without reported diagnosis 1979   Bowel obstruction (HCC)    repeated (? possible adhesions) neg EGD   Cataract ~2018   Not ready for surgery yet   Chronic kidney disease    Cirrhosis of liver (HCC)    asymptomatic.  Visible on scans.   Colitis    with colectomy   Diabetes mellitus without complication (HCC)    Dyspnea    covid   Gall stones 01/23/2006   Gastroenteritis 07/25/2014   hosp for IVF    GERD (gastroesophageal reflux disease)    History of kidney stones    HTN (hypertension)    Hyperglycemia    mild-monitors A1C   Ileostomy in place [facility])    Mild mitral regurgitation by prior echocardiogram    Obesity    OSA on CPAP    Pneumonia    Rosacea    Sleep apnea    CPAP everynight   Past Surgical History:  Procedure Laterality Date   APPENDECTOMY  08-1991   Along with colon removal   CATARACT EXTRACTION W/PHACO Left 02/06/2023   Procedure: CATARACT EXTRACTION PHACO AND INTRAOCULAR LENS PLACEMENT (IOC) LEFT DIABETIC 5.90 00:35.9;  Surgeon: Galen Manila, MD;  Location: [facility];  Service: Ophthalmology;  Laterality: Left;   CATARACT EXTRACTION W/PHACO Right 02/20/2023   Procedure: CATARACT EXTRACTION PHACO AND INTRAOCULAR LENS PLACEMENT (IOC) RIGHT DIABETIC 5.28 00:47.0;  Surgeon: Galen Manila, MD;  Location: [facility];  Service: Ophthalmology;  Laterality: Right;   CHOLECYSTECTOMY     COLECTOMY  1993   has ileostomy   COLON SURGERY  2021   removed rectum   COLONOSCOPY  08/1991   COLOSTOMY REVISION  12/06/2011   Procedure: COLOSTOMY REVISION;   Surgeon: Romie Levee, MD;  Location: [facility];  Service: General;  Laterality: Right;  OSTOMY revision   CYSTOSCOPY/URETEROSCOPY/HOLMIUM LASER/STENT PLACEMENT Left 04/11/2021   Procedure: CYSTOSCOPY/URETEROSCOPY/HOLMIUM LASER/STENT PLACEMENT;  Surgeon: Vanna Scotland, MD;  Location: [facility];  Service: Urology;  Laterality: Left;   EXAMINATION UNDER ANESTHESIA  12/06/2011   Procedure: EXAM UNDER ANESTHESIA;  Surgeon: Romie Levee, MD;  Location: [facility];  Service: General;  Laterality: N/A;  rectal exam under anesthesia, anal dilation, pouchoscopy     SPINE SURGERY  05/12/2022   2 fractured vertebra   TOTAL ABDOMINAL HYSTERECTOMY  05/2006   for abscessed ovaries   Social History   Tobacco Use   Smoking status: Never   Smokeless tobacco: Never  Vaping Use   Vaping status: Never Used  Substance Use Topics   Alcohol use: No   Drug use: Never   Family History  Problem Relation Age of Onset   Hypertension Mother    Hyperlipidemia Mother    Cirrhosis Mother        NASH   Diabetes Mother    Liver cancer Father        resection secondary to mets   Cancer Father        colon   Hyperlipidemia Father    Hypertension Father    Allergies Father    Ulcerative colitis Father    Arthritis Father    Allergies Brother    Gastric cancer Brother    Cancer Brother    Breast cancer Paternal Grandmother    Breast cancer Cousin 36       paternal cousins   ADD / ADHD Son    Allergies  Allergen Reactions   Ciprofloxacin Rash    Erythema and pruritis around IV site, erythema and rash along vein   Other Rash   Jardiance [Empagliflozin]     Increase in Cr    Lisinopril     REACTION: felt bad/ stomach hurt/ weak and tired   Metformin And Related     GI   Sulfa Antibiotics Rash   Current Outpatient Medications on File Prior to Visit  Medication Sig Dispense Refill   ACCU-CHEK GUIDE test strip TO CHECK GLUCOSE DAILY AND AS NEEDED FOR TYPE 2  DIABETES 100 strip 1   acetaminophen (TYLENOL) 325 MG tablet Take 650 mg by mouth every 6 (six) hours as needed for moderate pain.     atorvastatin (LIPITOR) 10 MG tablet TAKE 1/2 PILL BY MOUTH EVERY OTHER DAY 24 tablet 1   Blood Glucose Monitoring Suppl (ACCU-CHEK GUIDE) w/Device KIT To check glucose daily and as needed for type 2 diabetes 1 kit 0   Cholecalciferol (VITAMIN D3) 25 MCG (1000 UT) CAPS Take 1,000 Units by mouth daily.     Continuous Glucose Sensor (FREESTYLE LIBRE 3 SENSOR) MISC Use as directed for diabetes type 2 requiring insulin 6 each 3   Cyanocobalamin (B-12) 1000 MCG TABS Take 1 tablet by mouth daily.     esomeprazole (NEXIUM) 20 MG capsule Take 20 mg by mouth every other day.     fluticasone (FLONASE) 50 MCG/ACT nasal spray Place 2 sprays into both nostrils at bedtime.     Lancets (ACCU-CHEK MULTICLIX) lancets To check glucose daily and as needed for diabetes type 2 100 each 3   losartan (COZAAR) 25 MG tablet Take 25 mg by mouth daily.     Melatonin 1 MG CHEW Chew 1.5 mg by mouth at bedtime as needed.     metoprolol tartrate (LOPRESSOR) 25 MG tablet TAKE 1 TABLET BY MOUTH EVERY DAY 90 tablet 0   Multiple Vitamin (MULTIVITAMIN) tablet Take 1 tablet by mouth daily.     nitrofurantoin, macrocrystal-monohydrate, (MACROBID) 100 MG capsule Take 1 capsule (100 mg total) by mouth 2 (two) times daily for 7 days. 14 capsule 0   NON FORMULARY SLEEPS WITH BI-PAP     phenazopyridine (PYRIDIUM) 200 MG tablet Take 1 tablet (200 mg total) by mouth 3 (three) times daily as needed for pain. 10 tablet 0  simethicone (MYLICON) 80 MG chewable tablet Chew 1 tablet (80 mg total) by mouth 4 (four) times daily as needed for flatulence. 30 tablet 0   tirzepatide (MOUNJARO) 15 MG/0.5ML Pen Inject 15 mg into the skin once a week. 2 mL 0   zolpidem (AMBIEN) 10 MG tablet Take 0.5 tablets (5 mg total) by mouth at bedtime as needed for sleep. Caution of sedation and falls 15 tablet 3   No current  facility-administered medications on file prior to visit.    Review of Systems  Constitutional:  Positive for fatigue. Negative for activity change, appetite change, fever and unexpected weight change.  HENT:  Negative for congestion, ear pain, rhinorrhea, sinus pressure and sore throat.        Left sided facial tenderness/ swelling   Eyes:  Negative for pain, redness and visual disturbance.  Respiratory:  Negative for cough, shortness of breath and wheezing.   Cardiovascular:  Negative for chest pain and palpitations.  Gastrointestinal:  Negative for abdominal pain, blood in stool, constipation and diarrhea.  Endocrine: Negative for polydipsia and polyuria.  Genitourinary:  Negative for dysuria, frequency and urgency.  Musculoskeletal:  Negative for arthralgias, back pain and myalgias.  Skin:  Negative for pallor and rash.  Allergic/Immunologic: Negative for environmental allergies.  Neurological:  Negative for dizziness, syncope and headaches.  Hematological:  Negative for adenopathy. Does not bruise/bleed easily.  Psychiatric/Behavioral:  Negative for decreased concentration and dysphoric mood. The patient is not nervous/anxious.        Objective:   Physical Exam Constitutional:      General: She is not in acute distress.    Appearance: Normal appearance. She is well-developed. She is obese. She is not ill-appearing or diaphoretic.  HENT:     Head: Normocephalic.     Comments: Some mild swelling on / lateral to left cheekbone Tender over left TMJ without crepitus or popping  Mildly tender over proximal left mandible      Right Ear: Tympanic membrane and ear canal normal.     Left Ear: Tympanic membrane and ear canal normal.     Nose: Nose normal. No congestion.     Mouth/Throat:     Mouth: Mucous membranes are moist.     Pharynx: Oropharynx is clear.     Comments: No dental abn Eyes:     General:        Right eye: No discharge.        Left eye: No discharge.      Conjunctiva/sclera: Conjunctivae normal.     Pupils: Pupils are equal, round, and reactive to light.  Neck:     Thyroid: No thyromegaly.     Vascular: No carotid bruit or JVD.  Cardiovascular:     Rate and Rhythm: Normal rate and regular rhythm.     Heart sounds: Normal heart sounds.     No gallop.  Pulmonary:     Effort: Pulmonary effort is normal. No respiratory distress.     Breath sounds: Normal breath sounds. No wheezing or rales.  Abdominal:     General: There is no distension or abdominal bruit.     Palpations: Abdomen is soft.  Musculoskeletal:     Cervical back: Normal range of motion and neck supple.     Right lower leg: No edema.     Left lower leg: No edema.  Lymphadenopathy:     Cervical: No cervical adenopathy.  Skin:    General: Skin is warm and dry.  Coloration: Skin is not pale.     Findings: No rash.  Neurological:     Mental Status: She is alert.     Cranial Nerves: No cranial nerve deficit.     Motor: No weakness.     Coordination: Coordination normal.     Deep Tendon Reflexes: Reflexes are normal and symmetric. Reflexes normal.  Psychiatric:        Mood and Affect: Mood normal.           Assessment & Plan:   Problem List Items Addressed This Visit       Endocrine   Diabetes mellitus treated with injections of non-insulin medication (HCC)   Lab Results  Component Value Date   HGBA1C 7.2 (A) 04/16/2023   HGBA1C 7.3 (A) 12/26/2022   HGBA1C 6.9 (H) 10/05/2022   Stable  On moujaro -starting 15 mg dose  Holding glipizide 5 mg in light of some low readings  Had a steroid shot  Per pt has watched diet some for past 4-5 weeks and this A1c represented past 3 months  Encouraged low glycemic diet and exercise  Plan follow up in 3 months  Will continue to hold glipizide       Relevant Orders   POCT glycosylated hemoglobin (Hb A1C) (Completed)   CKD stage 3 due to type 2 diabetes mellitus (HCC)   Under care of nephrology  Most recent GFR  45.11 K 4.4         Other   Facial trauma, sequela - Primary   Pt fell onto left side of face in late jan (after cataract surgery when she could not see well on stairs)  Still some swelling and tenderness over cheek bone , TMJ and upper mandible No congestion or swallowing problems  Will get maxillofacial CT Advised cool compress prn       Relevant Orders   CT Maxillofacial WO CM   Abnormal finding on imaging of liver   Pt has had hepatology visit  Saw nutritionist and working on diet / less carbs and fat and more protein  Has found this helpful  Encouraged to work on muscle building exercise if able

## 2023-04-17 ENCOUNTER — Telehealth: Payer: Self-pay | Admitting: *Deleted

## 2023-04-17 NOTE — Telephone Encounter (Signed)
 Pt is aware of lab results but per Dr. Milinda Antis:  Please schedule annual exam (with fasting lab prior if possible) in 3 months

## 2023-04-18 DIAGNOSIS — M25512 Pain in left shoulder: Secondary | ICD-10-CM | POA: Diagnosis not present

## 2023-04-18 NOTE — Telephone Encounter (Signed)
 Called  pt and schedule a appt for cpe / lab

## 2023-04-19 ENCOUNTER — Ambulatory Visit
Admission: RE | Admit: 2023-04-19 | Discharge: 2023-04-19 | Disposition: A | Discharge status: Home / Self Care | Source: Ambulatory Visit | Attending: Family Medicine | Admitting: Family Medicine

## 2023-04-19 ENCOUNTER — Encounter: Payer: Self-pay | Admitting: Family Medicine

## 2023-04-19 DIAGNOSIS — R519 Headache, unspecified: Secondary | ICD-10-CM | POA: Diagnosis not present

## 2023-04-19 DIAGNOSIS — R220 Localized swelling, mass and lump, head: Secondary | ICD-10-CM | POA: Diagnosis not present

## 2023-04-19 DIAGNOSIS — S0993XS Unspecified injury of face, sequela: Secondary | ICD-10-CM

## 2023-04-20 ENCOUNTER — Other Ambulatory Visit: Payer: Self-pay | Admitting: Family Medicine

## 2023-04-23 ENCOUNTER — Other Ambulatory Visit: Payer: Self-pay | Admitting: Family Medicine

## 2023-04-23 MED ORDER — TIRZEPATIDE 15 MG/0.5ML ~~LOC~~ SOAJ: 15.0000 mg | SUBCUTANEOUS | 3 refills | Status: DC

## 2023-04-23 NOTE — Telephone Encounter (Signed)
 Last refill 03/22/23 2mL w/ 0 refills   LOV 04/16/23  NOV 07/19/23

## 2023-04-25 DIAGNOSIS — M25512 Pain in left shoulder: Secondary | ICD-10-CM | POA: Diagnosis not present

## 2023-05-02 ENCOUNTER — Ambulatory Visit: Payer: Self-pay | Admitting: Urology

## 2023-05-02 ENCOUNTER — Ambulatory Visit
Admission: RE | Admit: 2023-05-02 | Discharge: 2023-05-02 | Disposition: A | Discharge status: Home / Self Care | Source: Ambulatory Visit | Attending: Urology | Admitting: Urology

## 2023-05-02 VITALS — BP 128/72 | HR 91

## 2023-05-02 DIAGNOSIS — N201 Calculus of ureter: Secondary | ICD-10-CM | POA: Insufficient documentation

## 2023-05-02 DIAGNOSIS — N390 Urinary tract infection, site not specified: Secondary | ICD-10-CM | POA: Diagnosis not present

## 2023-05-02 DIAGNOSIS — N200 Calculus of kidney: Secondary | ICD-10-CM

## 2023-05-02 DIAGNOSIS — N2889 Other specified disorders of kidney and ureter: Secondary | ICD-10-CM | POA: Diagnosis not present

## 2023-05-02 MED ORDER — ESTRADIOL 0.1 MG/GM VA CREA: TOPICAL_CREAM | VAGINAL | 12 refills | Status: AC

## 2023-05-02 NOTE — Patient Instructions (Signed)
For UTI prevention take cranberry tablets, probiotic, D-Mannose daily.  

## 2023-05-02 NOTE — Progress Notes (Signed)
 Jamie [name],acting as a scribe for Jamie Gimenez, MD.,have documented all relevant documentation on the behalf of Jamie Gimenez, MD,as directed by  Jamie Gimenez, MD while in the presence of Jamie Gimenez, MD.  05/02/23 3:53 PM   Jamie [name] 1955/03/31 [id number]  Referring provider: Clemens Curt, MD [address] [place],  [place] [address]  Chief Complaint  Patient presents with   Follow-up    HPI: 68 year-old female with a history of nephrolithiasis, returns today for follow-up with a KUB.   She has a stone on her right side in the lower pole measuring about twelve millimeters, as seen on a KUB, which I am personally reviewing. There are no obvious stones on the left side, and this is unchanged from her last CT scan in July 2024.   She has been experiencing recurrent UTIs and was previously on suppressive Keflex. She underwent a cystoscopy in 2024. She reports having more than three UTIs a year and has not been using estrogen cream, which was never mentioned to her before.   She denies a personal history of breast cancer. She does not have a rectum and is careful with her ostomy care.   She has been seeing Jamie [name] for her UTIs and will continue to do so. She expresses frustration with the timing of her UTIs, often occurring on weekends, and has resorted to virtual appointments when necessary.  PMH: Past Medical History:  Diagnosis Date   Allergy 1970s   Sulfa drugs   Anemia    Arthritis    generalized   Blood transfusion without reported diagnosis 1979   Bowel obstruction (HCC)    repeated (? possible adhesions) neg EGD   Cataract ~2018   Not ready for surgery yet   Chronic kidney disease    Cirrhosis of liver (HCC)    asymptomatic.  Visible on scans.   Colitis    with colectomy   Diabetes mellitus without complication (HCC)    Dyspnea    covid   Gall stones 01/23/2006   Gastroenteritis 07/25/2014   hosp for IVF    GERD (gastroesophageal  reflux disease)    History of kidney stones    HTN (hypertension)    Hyperglycemia    mild-monitors A1C   Ileostomy in place [facility])    Mild mitral regurgitation by prior echocardiogram    Obesity    OSA on CPAP    Pneumonia    Rosacea    Sleep apnea    CPAP everynight    Surgical History: Past Surgical History:  Procedure Laterality Date   APPENDECTOMY  08-1991   Along with colon removal   CATARACT EXTRACTION W/PHACO Left 02/06/2023   Procedure: CATARACT EXTRACTION PHACO AND INTRAOCULAR LENS PLACEMENT (IOC) LEFT DIABETIC 5.90 00:35.9;  Surgeon: Jamie Crews, MD;  Location: [facility];  Service: Ophthalmology;  Laterality: Left;   CATARACT EXTRACTION W/PHACO Right 02/20/2023   Procedure: CATARACT EXTRACTION PHACO AND INTRAOCULAR LENS PLACEMENT (IOC) RIGHT DIABETIC 5.28 00:47.0;  Surgeon: Jamie Crews, MD;  Location: [facility];  Service: Ophthalmology;  Laterality: Right;   CHOLECYSTECTOMY     COLECTOMY  1993   has ileostomy   COLON SURGERY  2021   removed rectum   COLONOSCOPY  08/1991   COLOSTOMY REVISION  12/06/2011   Procedure: COLOSTOMY REVISION;  Surgeon: Jamie Nixon, MD;  Location: [facility];  Service: General;  Laterality: Right;  OSTOMY revision   CYSTOSCOPY/URETEROSCOPY/HOLMIUM LASER/STENT PLACEMENT Left 04/11/2021  Procedure: CYSTOSCOPY/URETEROSCOPY/HOLMIUM LASER/STENT PLACEMENT;  Surgeon: Jamie Gimenez, MD;  Location: [facility];  Service: Urology;  Laterality: Left;   EXAMINATION UNDER ANESTHESIA  12/06/2011   Procedure: EXAM UNDER ANESTHESIA;  Surgeon: Jamie Nixon, MD;  Location: [facility];  Service: General;  Laterality: N/A;  rectal exam under anesthesia, anal dilation, pouchoscopy     SPINE SURGERY  05/12/2022   2 fractured vertebra   TOTAL ABDOMINAL HYSTERECTOMY  05/2006   for abscessed ovaries    Home Medications:  Allergies as of 05/02/2023       Reactions   Ciprofloxacin Rash   Erythema and  pruritis around IV site, erythema and rash along vein   Other Rash   Jardiance [empagliflozin]    Increase in Cr    Lisinopril    REACTION: felt bad/ stomach hurt/ weak and tired   Metformin And Related    GI   Sulfa Antibiotics Rash        Medication List        Accurate as of May 02, 2023  3:53 PM. If you have any questions, ask your nurse or doctor.          Accu-Chek Guide test strip Generic drug: glucose blood TO CHECK GLUCOSE DAILY AND AS NEEDED FOR TYPE 2 DIABETES   Accu-Chek Guide w/Device Kit To check glucose daily and as needed for type 2 diabetes   accu-chek multiclix lancets To check glucose daily and as needed for diabetes type 2   acetaminophen 325 MG tablet Commonly known as: TYLENOL Take 650 mg by mouth every 6 (six) hours as needed for moderate pain.   atorvastatin 10 MG tablet Commonly known as: LIPITOR TAKE 1/2 PILL BY MOUTH EVERY OTHER DAY   B-12 1000 MCG Tabs Take 1 tablet by mouth daily.   esomeprazole 20 MG capsule Commonly known as: NEXIUM Take 20 mg by mouth every other day.   estradiol 0.1 MG/GM vaginal cream Commonly known as: ESTRACE Estrogen Cream Instruction Discard applicator Apply pea sized amount to tip of finger to urethra before bed. Wash hands well after application. Use Monday, Wednesday and Friday   fluticasone 50 MCG/ACT nasal spray Commonly known as: FLONASE Place 2 sprays into both nostrils at bedtime.   FreeStyle Libre 3 Sensor Misc Use as directed for diabetes type 2 requiring insulin   losartan 25 MG tablet Commonly known as: COZAAR Take 25 mg by mouth daily.   Melatonin 1 MG Chew Chew 1.5 mg by mouth at bedtime as needed.   metoprolol tartrate 25 MG tablet Commonly known as: LOPRESSOR TAKE 1 TABLET BY MOUTH EVERY DAY   multivitamin tablet Take 1 tablet by mouth daily.   NON FORMULARY SLEEPS WITH BI-PAP   phenazopyridine 200 MG tablet Commonly known as: Pyridium Take 1 tablet (200 mg total) by  mouth 3 (three) times daily as needed for pain.   simethicone 80 MG chewable tablet Commonly known as: MYLICON Chew 1 tablet (80 mg total) by mouth 4 (four) times daily as needed for flatulence.   tirzepatide 15 MG/0.5ML Pen Commonly known as: MOUNJARO Inject 15 mg into the skin once a week.   Vitamin D3 25 MCG (1000 UT) Caps Take 1,000 Units by mouth daily.   zolpidem 10 MG tablet Commonly known as: AMBIEN Take 0.5 tablets (5 mg total) by mouth at bedtime as needed for sleep. Caution of sedation and falls        Allergies:  Allergies  Allergen Reactions   Ciprofloxacin  Rash    Erythema and pruritis around IV site, erythema and rash along vein   Other Rash   Jardiance [Empagliflozin]     Increase in Cr    Lisinopril     REACTION: felt bad/ stomach hurt/ weak and tired   Metformin And Related     GI   Sulfa Antibiotics Rash    Family History: Family History  Problem Relation Age of Onset   Hypertension Mother    Hyperlipidemia Mother    Cirrhosis Mother        NASH   Diabetes Mother    Liver cancer Father        resection secondary to mets   Cancer Father        colon   Hyperlipidemia Father    Hypertension Father    Allergies Father    Ulcerative colitis Father    Arthritis Father    Allergies Brother    Gastric cancer Brother    Cancer Brother    Breast cancer Paternal Grandmother    Breast cancer Cousin 22       paternal cousins   ADD / ADHD Son     Social History:  reports that she has never smoked. She has never used smokeless tobacco. She reports that she does not drink alcohol and does not use drugs.   Physical Exam: BP 128/72   Pulse 91   Constitutional:  Alert and oriented, No acute distress. HEENT: [place] AT, moist mucus membranes.  Trachea midline, no masses. Neurologic: Grossly intact, no focal deficits, moving all 4 extremities. Psychiatric: Normal mood and affect.   Pertinent Imaging:  KUB was personally reviewed today. Radiologic  interpretation is pending.  EXAM: CT ABDOMEN AND PELVIS WITHOUT CONTRAST   TECHNIQUE: Multidetector CT imaging of the abdomen and pelvis was performed following the standard protocol without IV contrast.   RADIATION DOSE REDUCTION: This exam was performed according to the departmental dose-optimization program which includes automated exposure control, adjustment of the mA and/or kV according to patient size and/or use of iterative reconstruction technique.   COMPARISON:  05/19/2022   FINDINGS: Lower chest: No acute abnormality.   Hepatobiliary: Cirrhotic liver morphology with surface nodularity. Subtle area of ill-defined hypoattenuation in the subcapsular aspect of the anterolateral right hepatic lobe measuring approximately 3.1 x 2.8 cm (series 2, image 21). Prior cholecystectomy.   Pancreas: Unremarkable. No pancreatic ductal dilatation or surrounding inflammatory changes.   Spleen: Normal in size without focal abnormality.   Adrenals/Urinary Tract: Unremarkable adrenal glands. Two adjacent nonobstructing stones within the lower pole of the left kidney, each measuring approximately 4 mm. No right-sided nephrolithiasis. No hydronephrosis. Ureters are unremarkable. Circumferential urinary bladder wall thickening with perivesicular fat stranding.   Stomach/Bowel: Stomach within normal limits. Status post total proctocolectomy with right lower quadrant ileostomy. No bowel wall thickening or inflammatory changes.   Vascular/Lymphatic: Aortic atherosclerosis. No enlarged abdominal or pelvic lymph nodes.   Reproductive: Status post hysterectomy. No adnexal masses.   Other: No ascites. No pneumoperitoneum. Stable calcifications along the inferior margin of the liver and within the lower right paracolic gutter.   Musculoskeletal: No new or acute bony findings. Healed L1 compression fracture. Prior T11 to L3 spinal fusion. Bilateral hip osteoarthritis.   IMPRESSION: 1.  Circumferential urinary bladder wall thickening with perivesicular fat stranding suggestive of cystitis. Correlate with urinalysis. 2. Nonobstructing left renal stones. No hydronephrosis. 3. Cirrhotic liver morphology with subtle area of ill-defined hypoattenuation in the subcapsular aspect of the right hepatic  lobe measuring approximately 3.1 x 2.8 cm. Recommend further evaluation with nonemergent MRI of the abdomen with and without contrast. 4. Status post total proctocolectomy with right lower quadrant ileostomy. 5. Aortic atherosclerosis (ICD10-I70.0).     Electronically Signed   By: Leverne Reading D.O.   On: 08/02/2022 10:24  This was personally reviewed and I agree with the radiologic interpretation.  Assessment & Plan:    1. Nephrolithiasis - Her KUB shows a 12 mm stone on the right side, unchanged from the previous CT scan in July 2024. No new stones are observed.  - The recommendation is to maintain adequate hydration.  - No further imaging is required for another two years unless symptoms develop.  2. Recurrent UTI - She has a history of recurrent UTIs and was previously on suppressive Keflex.  - Recommend starting topical estrogen cream, applied three times a week, to prevent UTIs. She is advised to use a pea-sized amount on the urethra. Additional recommendations include cranberry tablets, probiotics, and D-mannose supplements, which can be purchased individually or in combination.  - She is advised to follow up with Jamie [name] next year for an annual check-up and to contact the provider if breakthrough UTIs occur.  - Recurrent UTI Prevention Strategies  Stay well hydrated. Get a moderate amount of exercise. Eat a diet rich in fruit and vegetables. Start a bowel regimen to manage your constipation. Your goal is to have consistent, formed bowel movements that are easy for you to pass. You may use either of the over-the-counter supplements Benefiber or Miralax to help with  this. I recommend that you try Benefiber first and move on to Miralax if this is not helping you enough. You may adjust the recommended dose of Miralax (one capful daily) to achieve this goal. Start taking an over-the-counter cranberry supplement for urinary tract health. Take this once or twice daily on an empty stomach, e.g. right before bed. Start taking an over-the-counter d-mannose supplement. Take this daily per packaging instructions. Start taking an over-the-counter probiotic containing the bacterial species called Lactobacillus. Take this daily. Start vaginal estrogen cream. Apply a pea-sized amount around the opening of the urethra every day for 2 weeks, then three times weekly forever.   Return in about 1 year (around 05/01/2024) for PA visit for follow up.  I have reviewed the above documentation for accuracy and completeness, and I agree with the above.   Jamie Gimenez, MD    [facility] [address], [address] [place], [place] [address] [phone]

## 2023-05-03 ENCOUNTER — Other Ambulatory Visit (INDEPENDENT_AMBULATORY_CARE_PROVIDER_SITE_OTHER): Admitting: *Deleted

## 2023-05-03 ENCOUNTER — Other Ambulatory Visit: Payer: Self-pay | Admitting: *Deleted

## 2023-05-03 ENCOUNTER — Other Ambulatory Visit

## 2023-05-03 DIAGNOSIS — N390 Urinary tract infection, site not specified: Secondary | ICD-10-CM

## 2023-05-03 LAB — URINALYSIS, COMPLETE
Bilirubin, UA: NEGATIVE
Glucose, UA: NEGATIVE
Ketones, UA: NEGATIVE
Nitrite, UA: POSITIVE — AB
Specific Gravity, UA: >1.03 — ABNORMAL HIGH (ref 1.005–1.030)
Urobilinogen, Ur: 0.2 mg/dL (ref 0.2–1.0)
pH, UA: 6 (ref 5.0–7.5)

## 2023-05-03 LAB — MICROSCOPIC EXAMINATION
RBC, Urine: >30 /HPF — AB (ref 0–2)
WBC, UA: >30 /HPF — AB (ref 0–5)

## 2023-05-03 MED ORDER — CEPHALEXIN 500 MG PO CAPS: 500.0000 mg | ORAL_CAPSULE | Freq: Four times a day (QID) | ORAL | 0 refills | Status: AC

## 2023-05-03 NOTE — Progress Notes (Signed)
 Patient advised.

## 2023-05-03 NOTE — Progress Notes (Signed)
 Mrs. Severin called the office today with dysuria, UTI symptoms.  UA grossly positive.  Will treat with Keflex.  UCx pending.  Vanna Scotland, MD

## 2023-05-03 NOTE — Addendum Note (Signed)
 Addended by: Vanna Scotland on: 05/03/2023 01:58 PM   Modules accepted: Orders, Level of Service

## 2023-05-04 DIAGNOSIS — M75122 Complete rotator cuff tear or rupture of left shoulder, not specified as traumatic: Secondary | ICD-10-CM | POA: Diagnosis not present

## 2023-05-04 DIAGNOSIS — M25512 Pain in left shoulder: Secondary | ICD-10-CM | POA: Diagnosis not present

## 2023-05-06 LAB — CULTURE, URINE COMPREHENSIVE

## 2023-05-09 DIAGNOSIS — M25512 Pain in left shoulder: Secondary | ICD-10-CM | POA: Diagnosis not present

## 2023-05-14 ENCOUNTER — Other Ambulatory Visit: Payer: Self-pay | Admitting: Family Medicine

## 2023-05-14 DIAGNOSIS — R609 Edema, unspecified: Secondary | ICD-10-CM | POA: Diagnosis not present

## 2023-05-14 DIAGNOSIS — R809 Proteinuria, unspecified: Secondary | ICD-10-CM | POA: Diagnosis not present

## 2023-05-14 DIAGNOSIS — I10 Essential (primary) hypertension: Secondary | ICD-10-CM | POA: Diagnosis not present

## 2023-05-14 DIAGNOSIS — E1122 Type 2 diabetes mellitus with diabetic chronic kidney disease: Secondary | ICD-10-CM | POA: Diagnosis not present

## 2023-05-14 DIAGNOSIS — N1831 Chronic kidney disease, stage 3a: Secondary | ICD-10-CM | POA: Diagnosis not present

## 2023-05-15 DIAGNOSIS — R208 Other disturbances of skin sensation: Secondary | ICD-10-CM | POA: Diagnosis not present

## 2023-05-15 DIAGNOSIS — K5190 Ulcerative colitis, unspecified, without complications: Secondary | ICD-10-CM | POA: Diagnosis not present

## 2023-05-15 DIAGNOSIS — D2272 Melanocytic nevi of left lower limb, including hip: Secondary | ICD-10-CM | POA: Diagnosis not present

## 2023-05-15 DIAGNOSIS — D045 Carcinoma in situ of skin of trunk: Secondary | ICD-10-CM | POA: Diagnosis not present

## 2023-05-15 DIAGNOSIS — L538 Other specified erythematous conditions: Secondary | ICD-10-CM | POA: Diagnosis not present

## 2023-05-15 DIAGNOSIS — L820 Inflamed seborrheic keratosis: Secondary | ICD-10-CM | POA: Diagnosis not present

## 2023-05-15 DIAGNOSIS — D2262 Melanocytic nevi of left upper limb, including shoulder: Secondary | ICD-10-CM | POA: Diagnosis not present

## 2023-05-15 DIAGNOSIS — R932 Abnormal findings on diagnostic imaging of liver and biliary tract: Secondary | ICD-10-CM | POA: Diagnosis not present

## 2023-05-15 DIAGNOSIS — G4733 Obstructive sleep apnea (adult) (pediatric): Secondary | ICD-10-CM | POA: Diagnosis not present

## 2023-05-15 DIAGNOSIS — Z932 Ileostomy status: Secondary | ICD-10-CM | POA: Diagnosis not present

## 2023-05-15 DIAGNOSIS — D225 Melanocytic nevi of trunk: Secondary | ICD-10-CM | POA: Diagnosis not present

## 2023-05-15 DIAGNOSIS — D2261 Melanocytic nevi of right upper limb, including shoulder: Secondary | ICD-10-CM | POA: Diagnosis not present

## 2023-05-15 DIAGNOSIS — E119 Type 2 diabetes mellitus without complications: Secondary | ICD-10-CM | POA: Diagnosis not present

## 2023-05-15 DIAGNOSIS — L821 Other seborrheic keratosis: Secondary | ICD-10-CM | POA: Diagnosis not present

## 2023-05-15 DIAGNOSIS — D2271 Melanocytic nevi of right lower limb, including hip: Secondary | ICD-10-CM | POA: Diagnosis not present

## 2023-05-21 DIAGNOSIS — K760 Fatty (change of) liver, not elsewhere classified: Secondary | ICD-10-CM | POA: Diagnosis not present

## 2023-05-21 DIAGNOSIS — R932 Abnormal findings on diagnostic imaging of liver and biliary tract: Secondary | ICD-10-CM | POA: Diagnosis not present

## 2023-05-28 ENCOUNTER — Telehealth: Admitting: Family Medicine

## 2023-05-28 ENCOUNTER — Ambulatory Visit
Admission: EM | Admit: 2023-05-28 | Discharge: 2023-05-28 | Disposition: A | Discharge status: Home / Self Care | Attending: Emergency Medicine | Admitting: Emergency Medicine

## 2023-05-28 DIAGNOSIS — R350 Frequency of micturition: Secondary | ICD-10-CM | POA: Insufficient documentation

## 2023-05-28 DIAGNOSIS — N390 Urinary tract infection, site not specified: Secondary | ICD-10-CM

## 2023-05-28 DIAGNOSIS — N3000 Acute cystitis without hematuria: Secondary | ICD-10-CM | POA: Diagnosis not present

## 2023-05-28 LAB — POCT URINALYSIS DIP (MANUAL ENTRY)
Glucose, UA: NEGATIVE mg/dL
Nitrite, UA: POSITIVE — AB
Protein Ur, POC: >=300 mg/dL — AB
Spec Grav, UA: >=1.03 — AB
Urobilinogen, UA: 1 U/dL
pH, UA: 6

## 2023-05-28 MED ORDER — NITROFURANTOIN MONOHYD MACRO 100 MG PO CAPS: 100.0000 mg | ORAL_CAPSULE | Freq: Two times a day (BID) | ORAL | 0 refills | Status: DC

## 2023-05-28 NOTE — Discharge Instructions (Addendum)
 Your urinalysis shows Jamie [name] blood cells and nitrates which are indicative of infection, your urine will be sent to the lab to determine exactly which bacteria is present, if any changes need to be made to your medications you will be notified  Begin use of Macrobid  twice daily for 5 days, E. coli seen on last urine culture available   You may use over-the-counter Azo to help minimize your symptoms until antibiotic removes bacteria, this medication will turn your urine orange  Increase your fluid intake through use of water  As always practice good hygiene, wiping front to back and avoidance of scented vaginal products to prevent further irritation  If symptoms continue to persist after use of medication or recur please follow-up with urgent care or your primary doctor as needed

## 2023-05-28 NOTE — Progress Notes (Signed)
 Jamie [name],   You have a more complicated history with UTI's we need to collect samples on you to make sure you get the best medication need. Please contact your urologist and or PCP for this.   You will not be charged for this visit.

## 2023-05-28 NOTE — ED Triage Notes (Signed)
 Patient presents to UC for urinary freq, urgency, and bladder spasms since today. Took tylenol  for the discomfort.   Denies fever, chills, or hematuria.

## 2023-05-28 NOTE — ED Provider Notes (Signed)
 Jamie [name]    CSN: [id number] Arrival date & time: 05/28/23  1142      History   Chief Complaint Chief Complaint  Patient presents with   Urinary Frequency    HPI Jamie [name] is a 68 y.o. female.   Patient presents for evaluation of urinary frequency and dysuria beginning this morning.  Has not attempted treatment.  Taking daily cranberry pills, estradiol  cream and probiotics as a preventative treatment directed by urology.  Denies abdominal, flank pain, fever, vaginal symptoms, hematuria.  Past Medical History:  Diagnosis Date   Allergy 1970s   Sulfa  drugs   Anemia    Arthritis    generalized   Blood transfusion without reported diagnosis 1979   Bowel obstruction (HCC)    repeated (? possible adhesions) neg EGD   Cataract ~2018   Not ready for surgery yet   Chronic kidney disease    Cirrhosis of liver (HCC)    asymptomatic.  Visible on scans.   Colitis    with colectomy   Diabetes mellitus without complication (HCC)    Dyspnea    covid   Gall stones 01/23/2006   Gastroenteritis 07/25/2014   hosp for IVF    GERD (gastroesophageal reflux disease)    History of kidney stones    HTN (hypertension)    Hyperglycemia    mild-monitors A1C   Ileostomy in place [facility])    Mild mitral regurgitation by prior echocardiogram    Obesity    OSA on CPAP    Pneumonia    Rosacea    Sleep apnea    CPAP everynight    Patient Active Problem List   Diagnosis Date Noted   Facial trauma, sequela 04/16/2023   Diabetes mellitus treated with injections of non-insulin  medication (HCC) 12/17/2022   Pedal edema 12/15/2022   Abnormal finding on imaging of liver 10/12/2022   Long-term current use of injectable noninsulin antidiabetic medication 10/12/2022   Panic anxiety syndrome 07/20/2022   Encounter for screening mammogram for breast cancer 07/11/2022   Estrogen deficiency 07/11/2022   Low serum vitamin B12 07/04/2022   Current use of proton pump inhibitor  07/03/2022   Low magnesium  level 06/06/2022   Closed tricolumnar fracture of lumbar vertebra (HCC) 05/12/2022   Compression fracture of L1 lumbar vertebra (HCC) 05/11/2022   Morbid obesity (HCC) 05/11/2022   Fall at home, initial encounter 05/11/2022   Chronic kidney disease, stage 3a (HCC) 05/11/2022   Closed fracture of first lumbar vertebra (HCC) 05/11/2022   Closed fracture of twelfth thoracic vertebra (HCC) 05/11/2022   Spinal instability, lumbar 05/11/2022   Hearing loss, right 04/07/2022   Abnormal SPEP 04/28/2021   Abnormal EKG 04/26/2021   CKD stage 3 due to type 2 diabetes mellitus (HCC) 05/31/2020   Aortic atherosclerosis (HCC) 03/01/2020   Elevated serum creatinine 05/14/2019   Dermatitis 07/01/2018   Left shoulder pain 11/30/2017   Palpitations 07/05/2017   Lichen planus 04/09/2017   Hyperlipidemia associated with type 2 diabetes mellitus (HCC) 04/10/2016   Red blood cell antibody positive 07/02/2013   Elevated C-reactive protein (CRP) 05/15/2012   Routine general medical examination at a health care facility 09/24/2011   Apnea 11/14/2010   Stress reaction, emotional 05/02/2010   Compulsive overeater 05/02/2010   Arthritis    H/O small bowel obstruction    KNEE PAIN 10/14/2009   DM (diabetes mellitus), type 2 with renal complications (HCC) 04/23/2009   ROSACEA 09/12/2007   Essential hypertension 05/15/2007   OSA (obstructive  sleep apnea) 12/13/2006   Ulcerative colitis (HCC) 07/12/2006    Past Surgical History:  Procedure Laterality Date   APPENDECTOMY  08-1991   Along with colon removal   CATARACT EXTRACTION W/PHACO Left 02/06/2023   Procedure: CATARACT EXTRACTION PHACO AND INTRAOCULAR LENS PLACEMENT (IOC) LEFT DIABETIC 5.90 00:35.9;  Surgeon: Clair Crews, MD;  Location: [facility];  Service: Ophthalmology;  Laterality: Left;   CATARACT EXTRACTION W/PHACO Right 02/20/2023   Procedure: CATARACT EXTRACTION PHACO AND INTRAOCULAR LENS PLACEMENT (IOC)  RIGHT DIABETIC 5.28 00:47.0;  Surgeon: Clair Crews, MD;  Location: [facility];  Service: Ophthalmology;  Laterality: Right;   CHOLECYSTECTOMY     COLECTOMY  1993   has ileostomy   COLON SURGERY  2021   removed rectum   COLONOSCOPY  08/1991   COLOSTOMY REVISION  12/06/2011   Procedure: COLOSTOMY REVISION;  Surgeon: Joyce Nixon, MD;  Location: [facility];  Service: General;  Laterality: Right;  OSTOMY revision   CYSTOSCOPY/URETEROSCOPY/HOLMIUM LASER/STENT PLACEMENT Left 04/11/2021   Procedure: CYSTOSCOPY/URETEROSCOPY/HOLMIUM LASER/STENT PLACEMENT;  Surgeon: Dustin Gimenez, MD;  Location: [facility];  Service: Urology;  Laterality: Left;   EXAMINATION UNDER ANESTHESIA  12/06/2011   Procedure: EXAM UNDER ANESTHESIA;  Surgeon: Joyce Nixon, MD;  Location: [facility];  Service: General;  Laterality: N/A;  rectal exam under anesthesia, anal dilation, pouchoscopy     SPINE SURGERY  05/12/2022   2 fractured vertebra   TOTAL ABDOMINAL HYSTERECTOMY  05/2006   for abscessed ovaries    OB History   No obstetric history on file.      Home Medications    Prior to Admission medications   Medication Sig Start Date End Date Taking? Authorizing Provider  nitrofurantoin , macrocrystal-monohydrate, (MACROBID ) 100 MG capsule Take 1 capsule (100 mg total) by mouth 2 (two) times daily. 05/28/23  Yes Jahira Swiss, Maybelle Spatz, NP  ACCU-CHEK GUIDE test strip TO CHECK GLUCOSE DAILY AND AS NEEDED FOR TYPE 2 DIABETES 02/13/22   Tower, Manley Seeds, MD  acetaminophen  (TYLENOL ) 325 MG tablet Take 650 mg by mouth every 6 (six) hours as needed for moderate pain.    [provider]  atorvastatin  (LIPITOR) 10 MG tablet TAKE 1/2 PILL BY MOUTH EVERY OTHER DAY 11/16/22   Tower, Manley Seeds, MD  Blood Glucose Monitoring Suppl (ACCU-CHEK GUIDE) w/Device KIT To check glucose daily and as needed for type 2 diabetes 06/26/19   Tower, Manley Seeds, MD  Cholecalciferol  (VITAMIN D3) 25 MCG (1000  UT) CAPS Take 1,000 Units by mouth daily.    [provider]  Continuous Glucose Sensor (FREESTYLE LIBRE 3 SENSOR) MISC Use as directed for diabetes type 2 requiring insulin  12/26/22   Tower, Manley Seeds, MD  Cyanocobalamin  (B-12) 1000 MCG TABS Take 1 tablet by mouth daily.    [provider]  esomeprazole (NEXIUM) 20 MG capsule Take 20 mg by mouth every other day.    [provider]  estradiol  (ESTRACE ) 0.1 MG/GM vaginal cream Estrogen Cream Instruction Discard applicator Apply pea sized amount to tip of finger to urethra before bed. Wash hands well after application. Use Monday, Wednesday and Friday 05/02/23   Dustin Gimenez, MD  fluticasone  (FLONASE ) 50 MCG/ACT nasal spray Place 2 sprays into both nostrils at bedtime.    [provider]  Lancets (ACCU-CHEK MULTICLIX) lancets To check glucose daily and as needed for diabetes type 2 06/26/19   Tower, Manley Seeds, MD  losartan  (COZAAR ) 25 MG tablet Take 25 mg by mouth daily. 02/15/21  [provider]  Melatonin 1 MG CHEW Chew 1.5 mg by mouth at bedtime as needed.    [provider]  metoprolol  tartrate (LOPRESSOR ) 25 MG tablet TAKE 1 TABLET BY MOUTH EVERY DAY 05/15/23   Tower, Manley Seeds, MD  Multiple Vitamin (MULTIVITAMIN) tablet Take 1 tablet by mouth daily.    [provider]  NON FORMULARY SLEEPS WITH BI-PAP    [provider]  phenazopyridine  (PYRIDIUM ) 200 MG tablet Take 1 tablet (200 mg total) by mouth 3 (three) times daily as needed for pain. 08/02/22   Vaillancourt, Samantha, PA-C  simethicone  (MYLICON) 80 MG chewable tablet Chew 1 tablet (80 mg total) by mouth 4 (four) times daily as needed for flatulence. 05/24/22   Montey Apa, DO  tirzepatide  (MOUNJARO ) 15 MG/0.5ML Pen Inject 15 mg into the skin once a week. 04/23/23   Tower, Manley Seeds, MD  zolpidem  (AMBIEN ) 10 MG tablet Take 0.5 tablets (5 mg total) by mouth at bedtime as needed for sleep. Caution of sedation and falls 07/11/22    Tower, Manley Seeds, MD    Family History Family History  Problem Relation Age of Onset   Hypertension Mother    Hyperlipidemia Mother    Cirrhosis Mother        NASH   Diabetes Mother    Liver cancer Father        resection secondary to mets   Cancer Father        colon   Hyperlipidemia Father    Hypertension Father    Allergies Father    Ulcerative colitis Father    Arthritis Father    Allergies Brother    Gastric cancer Brother    Cancer Brother    Breast cancer Paternal Grandmother    Breast cancer Cousin 31       paternal cousins   ADD / ADHD Son     Social History Social History   Tobacco Use   Smoking status: Never   Smokeless tobacco: Never  Vaping Use   Vaping status: Never Used  Substance Use Topics   Alcohol use: No   Drug use: Never     Allergies   Ciprofloxacin, Other, Jardiance  [empagliflozin ], Lisinopril, Metformin  and related, and Sulfa  antibiotics   Review of Systems Review of Systems   Physical Exam Triage Vital Signs ED Triage Vitals  Encounter Vitals Group     BP 05/28/23 1155 (!) 168/71     Systolic BP Percentile --      Diastolic BP Percentile --      Pulse Rate 05/28/23 1155 92     Resp 05/28/23 1155 16     Temp 05/28/23 1155 (!) 96.6 F (35.9 C)     Temp Source 05/28/23 1155 Temporal     SpO2 --      Weight --      Height --      Head Circumference --      Peak Flow --      Pain Score 05/28/23 1154 3     Pain Loc --      Pain Education --      Exclude from Growth Chart --    No data found.  Updated Vital Signs BP (!) 168/71 (BP Location: Left Arm)   Pulse 92   Temp (!) 96.6 F (35.9 C) (Temporal)   Resp 16   Visual Acuity Right Eye Distance:   Left Eye Distance:   Bilateral Distance:    Right Eye Near:  Left Eye Near:    Bilateral Near:     Physical Exam Constitutional:      Appearance: Normal appearance.  Eyes:     Extraocular Movements: Extraocular movements intact.  Pulmonary:     Effort: Pulmonary  effort is normal.  Abdominal:     Tenderness: There is no abdominal tenderness. There is no right CVA tenderness, left CVA tenderness or guarding.  Neurological:     Mental Status: She is alert and oriented to person, place, and time.      UC Treatments / Results  Labs (all labs ordered are listed, but only abnormal results are displayed) Labs Reviewed  POCT URINALYSIS DIP (MANUAL ENTRY) - Abnormal; Notable for the following components:      Result Value   Clarity, UA cloudy (*)    Bilirubin, UA small (*)    Ketones, POC UA trace (5) (*)    Spec Grav, UA >=1.030 (*)    Blood, UA large (*)    Protein Ur, POC >=300 (*)    Nitrite, UA Positive (*)    Leukocytes, UA Large (3+) (*)    All other components within normal limits  URINE CULTURE    EKG   Radiology No results found.  Procedures Procedures (including critical care time)  Medications Ordered in UC Medications - No data to display  Initial Impression / Assessment and Plan / UC Course  I have reviewed the triage vital signs and the nursing notes.  Pertinent labs & imaging results that were available during my care of the patient were reviewed by me and considered in my medical decision making (see chart for details).  Acute cystitis without hematuria, urinary frequency  Urinalysis showing leukocytes and nitrates, sent for culture, last culture available shows E. coli, used Keflex  in early April, prescribed Macrobid  and recommended supportive care and advised follow-up with urgent care or urology for further management Final Clinical Impressions(s) / UC Diagnoses   Final diagnoses:  Urinary frequency  Acute cystitis without hematuria     Discharge Instructions      Your urinalysis shows Granvil Djordjevic blood cells and nitrates which are indicative of infection, your urine will be sent to the lab to determine exactly which bacteria is present, if any changes need to be made to your medications you will be  notified  Begin use of Macrobid  twice daily for 5 days, E. coli seen on last urine culture available   You may use over-the-counter Azo to help minimize your symptoms until antibiotic removes bacteria, this medication will turn your urine orange  Increase your fluid intake through use of water  As always practice good hygiene, wiping front to back and avoidance of scented vaginal products to prevent further irritation  If symptoms continue to persist after use of medication or recur please follow-up with urgent care or your primary doctor as needed    ED Prescriptions     Medication Sig Dispense Auth. Provider   nitrofurantoin , macrocrystal-monohydrate, (MACROBID ) 100 MG capsule Take 1 capsule (100 mg total) by mouth 2 (two) times daily. 10 capsule Armeda Plumb R, NP      PDMP not reviewed this encounter.   Reena Canning, NP 05/28/23 1215

## 2023-05-29 ENCOUNTER — Ambulatory Visit: Admitting: Physician Assistant

## 2023-05-29 DIAGNOSIS — R208 Other disturbances of skin sensation: Secondary | ICD-10-CM | POA: Diagnosis not present

## 2023-05-29 DIAGNOSIS — L820 Inflamed seborrheic keratosis: Secondary | ICD-10-CM | POA: Diagnosis not present

## 2023-05-29 DIAGNOSIS — L538 Other specified erythematous conditions: Secondary | ICD-10-CM | POA: Diagnosis not present

## 2023-05-29 DIAGNOSIS — D045 Carcinoma in situ of skin of trunk: Secondary | ICD-10-CM | POA: Diagnosis not present

## 2023-05-30 LAB — URINE CULTURE: Culture: >=100000 — AB

## 2023-06-04 ENCOUNTER — Ambulatory Visit
Admission: EM | Admit: 2023-06-04 | Discharge: 2023-06-04 | Disposition: A | Discharge status: Home / Self Care | Attending: Emergency Medicine | Admitting: Emergency Medicine

## 2023-06-04 DIAGNOSIS — N390 Urinary tract infection, site not specified: Secondary | ICD-10-CM | POA: Diagnosis not present

## 2023-06-04 DIAGNOSIS — R319 Hematuria, unspecified: Secondary | ICD-10-CM | POA: Diagnosis not present

## 2023-06-04 LAB — POCT URINALYSIS DIP (MANUAL ENTRY)
Bilirubin, UA: NEGATIVE
Glucose, UA: NEGATIVE mg/dL
Nitrite, UA: POSITIVE — AB
Protein Ur, POC: >=300 mg/dL — AB
Spec Grav, UA: 1.025 (ref 1.010–1.025)
Urobilinogen, UA: 0.2 U/dL
pH, UA: 6 (ref 5.0–8.0)

## 2023-06-04 MED ORDER — CEPHALEXIN 500 MG PO CAPS: 500.0000 mg | ORAL_CAPSULE | Freq: Three times a day (TID) | ORAL | 0 refills | Status: AC

## 2023-06-04 NOTE — ED Triage Notes (Signed)
 Patient to Urgent Care with complaints of bladder spasms/ urinary urgency.   Symptoms started this morning. Finished 5-day course of Macrobid  on Friday.   Has urology appointment in June.

## 2023-06-04 NOTE — Discharge Instructions (Signed)
Take the antibiotic as directed.  The urine culture is pending.  We will call you if it shows the need to change or discontinue your antibiotic.    Follow up with your primary care provider or urologist.    

## 2023-06-04 NOTE — ED Provider Notes (Signed)
 [name]    CSN: [id number] Arrival date & time: 06/04/23  1103      History   Chief Complaint Chief Complaint  Patient presents with   Urinary Frequency    HPI Jamie [name] is a 68 y.o. female.  Patient presents with dysuria and urinary urgency since this morning.  She just completed Macrobid  3 days ago.  She states her urinary symptoms had improved until this morning.  No fever, abdominal pain, flank pain, hematuria.  Patient was seen at this urgent care on 05/28/2023; diagnosed with urinary frequency and acute cystitis; treated with Macrobid ; urine culture positive for E. coli and sensitive to the antibiotic.  Patient reports history of recurrent UTIs and is followed by urology, as well as nephrology.  Her medical history also includes diabetes and CKD 3.  The history is provided by the patient and medical records.    Past Medical History:  Diagnosis Date   Allergy 1970s   Sulfa  drugs   Anemia    Arthritis    generalized   Blood transfusion without reported diagnosis 1979   Bowel obstruction (HCC)    repeated (? possible adhesions) neg EGD   Cataract ~2018   Not ready for surgery yet   Chronic kidney disease    Cirrhosis of liver (HCC)    asymptomatic.  Visible on scans.   Colitis    with colectomy   Diabetes mellitus without complication (HCC)    Dyspnea    covid   Gall stones 01/23/2006   Gastroenteritis 07/25/2014   hosp for IVF    GERD (gastroesophageal reflux disease)    History of kidney stones    HTN (hypertension)    Hyperglycemia    mild-monitors A1C   Ileostomy in place [facility])    Mild mitral regurgitation by prior echocardiogram    Obesity    OSA on CPAP    Pneumonia    Rosacea    Sleep apnea    CPAP everynight    Patient Active Problem List   Diagnosis Date Noted   Facial trauma, sequela 04/16/2023   Diabetes mellitus treated with injections of non-insulin  medication (HCC) 12/17/2022   Pedal edema 12/15/2022   Abnormal  finding on imaging of liver 10/12/2022   Long-term current use of injectable noninsulin antidiabetic medication 10/12/2022   Panic anxiety syndrome 07/20/2022   Encounter for screening mammogram for breast cancer 07/11/2022   Estrogen deficiency 07/11/2022   Low serum vitamin B12 07/04/2022   Current use of proton pump inhibitor 07/03/2022   Low magnesium  level 06/06/2022   Closed tricolumnar fracture of lumbar vertebra (HCC) 05/12/2022   Compression fracture of L1 lumbar vertebra (HCC) 05/11/2022   Morbid obesity (HCC) 05/11/2022   Fall at home, initial encounter 05/11/2022   Chronic kidney disease, stage 3a (HCC) 05/11/2022   Closed fracture of first lumbar vertebra (HCC) 05/11/2022   Closed fracture of twelfth thoracic vertebra (HCC) 05/11/2022   Spinal instability, lumbar 05/11/2022   Hearing loss, right 04/07/2022   Abnormal SPEP 04/28/2021   Abnormal EKG 04/26/2021   CKD stage 3 due to type 2 diabetes mellitus (HCC) 05/31/2020   Aortic atherosclerosis (HCC) 03/01/2020   Elevated serum creatinine 05/14/2019   Dermatitis 07/01/2018   Left shoulder pain 11/30/2017   Palpitations 07/05/2017   Lichen planus 04/09/2017   Hyperlipidemia associated with type 2 diabetes mellitus (HCC) 04/10/2016   Red blood cell antibody positive 07/02/2013   Elevated C-reactive protein (CRP) 05/15/2012   Routine general  medical examination at a health care facility 09/24/2011   Apnea 11/14/2010   Stress reaction, emotional 05/02/2010   Compulsive overeater 05/02/2010   Arthritis    H/O small bowel obstruction    KNEE PAIN 10/14/2009   DM (diabetes mellitus), type 2 with renal complications (HCC) 04/23/2009   ROSACEA 09/12/2007   Essential hypertension 05/15/2007   OSA (obstructive sleep apnea) 12/13/2006   Ulcerative colitis (HCC) 07/12/2006    Past Surgical History:  Procedure Laterality Date   APPENDECTOMY  08-1991   Along with colon removal   CATARACT EXTRACTION W/PHACO Left 02/06/2023    Procedure: CATARACT EXTRACTION PHACO AND INTRAOCULAR LENS PLACEMENT (IOC) LEFT DIABETIC 5.90 00:35.9;  Surgeon: Clair Crews, MD;  Location: [facility];  Service: Ophthalmology;  Laterality: Left;   CATARACT EXTRACTION W/PHACO Right 02/20/2023   Procedure: CATARACT EXTRACTION PHACO AND INTRAOCULAR LENS PLACEMENT (IOC) RIGHT DIABETIC 5.28 00:47.0;  Surgeon: Clair Crews, MD;  Location: [facility];  Service: Ophthalmology;  Laterality: Right;   CHOLECYSTECTOMY     COLECTOMY  1993   has ileostomy   COLON SURGERY  2021   removed rectum   COLONOSCOPY  08/1991   COLOSTOMY REVISION  12/06/2011   Procedure: COLOSTOMY REVISION;  Surgeon: Joyce Nixon, MD;  Location: [facility];  Service: General;  Laterality: Right;  OSTOMY revision   CYSTOSCOPY/URETEROSCOPY/HOLMIUM LASER/STENT PLACEMENT Left 04/11/2021   Procedure: CYSTOSCOPY/URETEROSCOPY/HOLMIUM LASER/STENT PLACEMENT;  Surgeon: Dustin Gimenez, MD;  Location: [facility];  Service: Urology;  Laterality: Left;   EXAMINATION UNDER ANESTHESIA  12/06/2011   Procedure: EXAM UNDER ANESTHESIA;  Surgeon: Joyce Nixon, MD;  Location: [facility];  Service: General;  Laterality: N/A;  rectal exam under anesthesia, anal dilation, pouchoscopy     SPINE SURGERY  05/12/2022   2 fractured vertebra   SQUAMOUS CELL CARCINOMA EXCISION     TOTAL ABDOMINAL HYSTERECTOMY  05/2006   for abscessed ovaries    OB History   No obstetric history on file.      Home Medications    Prior to Admission medications   Medication Sig Start Date End Date Taking? Authorizing Provider  cephALEXin  (KEFLEX ) 500 MG capsule Take 1 capsule (500 mg total) by mouth 3 (three) times daily for 5 days. 06/04/23 06/09/23 Yes Wellington Half, NP  ACCU-CHEK GUIDE test strip TO CHECK GLUCOSE DAILY AND AS NEEDED FOR TYPE 2 DIABETES 02/13/22   Tower, Manley Seeds, MD  acetaminophen  (TYLENOL ) 325 MG tablet Take 650 mg by mouth every 6 (six)  hours as needed for moderate pain.    [provider]  atorvastatin  (LIPITOR) 10 MG tablet TAKE 1/2 PILL BY MOUTH EVERY OTHER DAY 11/16/22   Tower, Manley Seeds, MD  Blood Glucose Monitoring Suppl (ACCU-CHEK GUIDE) w/Device KIT To check glucose daily and as needed for type 2 diabetes 06/26/19   Tower, Manley Seeds, MD  Cholecalciferol  (VITAMIN D3) 25 MCG (1000 UT) CAPS Take 1,000 Units by mouth daily.    [provider]  Continuous Glucose Sensor (FREESTYLE LIBRE 3 SENSOR) MISC Use as directed for diabetes type 2 requiring insulin  12/26/22   Tower, Manley Seeds, MD  Cyanocobalamin  (B-12) 1000 MCG TABS Take 1 tablet by mouth daily.    [provider]  esomeprazole (NEXIUM) 20 MG capsule Take 20 mg by mouth every other day.    [provider]  estradiol  (ESTRACE ) 0.1 MG/GM vaginal cream Estrogen Cream Instruction Discard applicator Apply pea sized amount to tip of finger to urethra before  bed. Wash hands well after application. Use Monday, Wednesday and Friday 05/02/23   Dustin Gimenez, MD  fluticasone  (FLONASE ) 50 MCG/ACT nasal spray Place 2 sprays into both nostrils at bedtime.    [provider]  Lancets (ACCU-CHEK MULTICLIX) lancets To check glucose daily and as needed for diabetes type 2 06/26/19   Tower, Manley Seeds, MD  losartan  (COZAAR ) 25 MG tablet Take 25 mg by mouth daily. 02/15/21   [provider]  Melatonin 1 MG CHEW Chew 1.5 mg by mouth at bedtime as needed.    [provider]  metoprolol  tartrate (LOPRESSOR ) 25 MG tablet TAKE 1 TABLET BY MOUTH EVERY DAY 05/15/23   Tower, Manley Seeds, MD  Multiple Vitamin (MULTIVITAMIN) tablet Take 1 tablet by mouth daily.    [provider]  nitrofurantoin , macrocrystal-monohydrate, (MACROBID ) 100 MG capsule Take 1 capsule (100 mg total) by mouth 2 (two) times daily. Patient not taking: Reported on 06/04/2023 05/28/23   Reena Canning, NP  NON FORMULARY SLEEPS WITH BI-PAP    [provider]   phenazopyridine  (PYRIDIUM ) 200 MG tablet Take 1 tablet (200 mg total) by mouth 3 (three) times daily as needed for pain. 08/02/22   Vaillancourt, Samantha, PA-C  simethicone  (MYLICON) 80 MG chewable tablet Chew 1 tablet (80 mg total) by mouth 4 (four) times daily as needed for flatulence. 05/24/22   Darus Engels A, DO  tirzepatide  (MOUNJARO ) 15 MG/0.5ML Pen Inject 15 mg into the skin once a week. 04/23/23   Tower, Manley Seeds, MD  zolpidem  (AMBIEN ) 10 MG tablet Take 0.5 tablets (5 mg total) by mouth at bedtime as needed for sleep. Caution of sedation and falls 07/11/22   Tower, Manley Seeds, MD    Family History Family History  Problem Relation Age of Onset   Hypertension Mother    Hyperlipidemia Mother    Cirrhosis Mother        NASH   Diabetes Mother    Liver cancer Father        resection secondary to mets   Cancer Father        colon   Hyperlipidemia Father    Hypertension Father    Allergies Father    Ulcerative colitis Father    Arthritis Father    Allergies Brother    Gastric cancer Brother    Cancer Brother    Breast cancer Paternal Grandmother    Breast cancer Cousin 49       paternal cousins   ADD / ADHD Son     Social History Social History   Tobacco Use   Smoking status: Never   Smokeless tobacco: Never  Vaping Use   Vaping status: Never Used  Substance Use Topics   Alcohol use: No   Drug use: Never     Allergies   Ciprofloxacin, Other, Jardiance  [empagliflozin ], Lisinopril, Metformin  and related, and Sulfa  antibiotics   Review of Systems Review of Systems  Constitutional:  Negative for chills and fever.  Gastrointestinal:  Negative for abdominal pain.  Genitourinary:  Positive for dysuria and urgency. Negative for flank pain and hematuria.     Physical Exam Triage Vital Signs ED Triage Vitals  Encounter Vitals Group     BP 06/04/23 1147 (!) 144/75     Systolic BP Percentile --      Diastolic BP Percentile --      Pulse Rate 06/04/23 1147 74      Resp 06/04/23 1147 17     Temp 06/04/23 1147 98  F (36.7 C)     Temp src --      SpO2 06/04/23 1147 97 %     Weight --      Height --      Head Circumference --      Peak Flow --      Pain Score 06/04/23 1143 2     Pain Loc --      Pain Education --      Exclude from Growth Chart --    No data found.  Updated Vital Signs BP (!) 144/75   Pulse 74   Temp 98 F (36.7 C)   Resp 17   SpO2 97%   Visual Acuity Right Eye Distance:   Left Eye Distance:   Bilateral Distance:    Right Eye Near:   Left Eye Near:    Bilateral Near:     Physical Exam Constitutional:      General: She is not in acute distress. HENT:     Mouth/Throat:     Mouth: Mucous membranes are moist.  Cardiovascular:     Rate and Rhythm: Normal rate and regular rhythm.  Pulmonary:     Effort: Pulmonary effort is normal. No respiratory distress.  Abdominal:     General: Bowel sounds are normal.     Palpations: Abdomen is soft.     Tenderness: There is no abdominal tenderness. There is no right CVA tenderness, left CVA tenderness, guarding or rebound.  Neurological:     Mental Status: She is alert.      UC Treatments / Results  Labs (all labs ordered are listed, but only abnormal results are displayed) Labs Reviewed  POCT URINALYSIS DIP (MANUAL ENTRY) - Abnormal; Notable for the following components:      Result Value   Clarity, UA cloudy (*)    Ketones, POC UA trace (5) (*)    Blood, UA large (*)    Protein Ur, POC >=300 (*)    Nitrite, UA Positive (*)    Leukocytes, UA Large (3+) (*)    All other components within normal limits  URINE CULTURE    EKG   Radiology No results found.  Procedures Procedures (including critical care time)  Medications Ordered in UC Medications - No data to display  Initial Impression / Assessment and Plan / UC Course  I have reviewed the triage vital signs and the nursing notes.  Pertinent labs & imaging results that were available during my care of  the patient were reviewed by me and considered in my medical decision making (see chart for details).    UTI with hematuria.  Treating today with cephalexin .  Urine culture pending.  Instructed patient to follow-up with her PCP or urologist as her symptoms are recurrent.  Education provided on UTI.  ED precautions given.  Patient agrees to plan of care.  Final Clinical Impressions(s) / UC Diagnoses   Final diagnoses:  Urinary tract infection with hematuria, site unspecified     Discharge Instructions      Take the antibiotic as directed.  The urine culture is pending.  We will call you if it shows the need to change or discontinue your antibiotic.    Follow up with your primary care provider or urologist.      ED Prescriptions     Medication Sig Dispense Auth. Provider   cephALEXin  (KEFLEX ) 500 MG capsule Take 1 capsule (500 mg total) by mouth 3 (three) times daily for 5 days. 15 capsule Arabella Knife,  Adonis Hoot, NP      PDMP not reviewed this encounter.   Wellington Half, NP 06/04/23 1213

## 2023-06-06 ENCOUNTER — Ambulatory Visit (HOSPITAL_COMMUNITY): Payer: Self-pay

## 2023-06-06 LAB — URINE CULTURE: Culture: >=100000 — AB

## 2023-06-11 ENCOUNTER — Ambulatory Visit: Admitting: Physician Assistant

## 2023-06-11 VITALS — BP 198/65 | HR 99 | Ht 65.0 in | Wt 262.0 lb

## 2023-06-11 DIAGNOSIS — N200 Calculus of kidney: Secondary | ICD-10-CM

## 2023-06-11 DIAGNOSIS — N390 Urinary tract infection, site not specified: Secondary | ICD-10-CM

## 2023-06-11 LAB — URINALYSIS, COMPLETE
Bilirubin, UA: NEGATIVE
Glucose, UA: NEGATIVE
Ketones, UA: NEGATIVE
Nitrite, UA: NEGATIVE
RBC, UA: NEGATIVE
Specific Gravity, UA: 1.03 (ref 1.005–1.030)
Urobilinogen, Ur: 0.2 mg/dL (ref 0.2–1.0)
pH, UA: 6 (ref 5.0–7.5)

## 2023-06-11 LAB — MICROSCOPIC EXAMINATION
Epithelial Cells (non renal): >10 /HPF — AB (ref 0–10)
WBC, UA: >30 /HPF — AB (ref 0–5)

## 2023-06-11 LAB — BLADDER SCAN AMB NON-IMAGING

## 2023-06-11 MED ORDER — METHENAMINE HIPPURATE 1 G PO TABS: 1.0000 g | ORAL_TABLET | Freq: Two times a day (BID) | ORAL | 5 refills | Status: DC

## 2023-06-14 LAB — CULTURE, URINE COMPREHENSIVE

## 2023-06-15 NOTE — Progress Notes (Signed)
 06/11/2023 1:07 PM   Jamie [name] Aug 22, 1955 [id number]  CC: Chief Complaint  Patient presents with   Recurrent UTI   HPI: Jamie [name] is a 68 y.o. female with PMH recurrent UTI, nephrolithiasis, and urinary retention following thoracic spinal fusion, now resolved, who presents today for evaluation of recurrent versus persistent UTI.  Today she reports she was recently treated for recurrent versus persistent UTI and completed a course of Macrobid , then Keflex .  She completed antibiotics most recently 2 days ago.  She still having bothersome urinary urgency.  She denies fever, chills, nausea, vomiting, flank pain, gross hematuria, fecaluria, and pneumaturia.  Urine cultures dated 05/28/2023 and 06/04/2023 grew pansensitive E. coli.  Notably, she used a female urinal device to collect a urine specimen today.  She had washed it prior to use.  In-office UA today positive for trace protein and trace leukocytes; urine microscopy with >30 WBCs/HPF, >10 epithelial cells/HPF, and moderate bacteria. PVR .  PMH: Past Medical History:  Diagnosis Date   Allergy 1970s   Sulfa  drugs   Anemia    Arthritis    generalized   Blood transfusion without reported diagnosis 1979   Bowel obstruction (HCC)    repeated (? possible adhesions) neg EGD   Cataract ~2018   Not ready for surgery yet   Chronic kidney disease    Cirrhosis of liver (HCC)    asymptomatic.  Visible on scans.   Colitis    with colectomy   Diabetes mellitus without complication (HCC)    Dyspnea    covid   Gall stones 01/23/2006   Gastroenteritis 07/25/2014   hosp for IVF    GERD (gastroesophageal reflux disease)    History of kidney stones    HTN (hypertension)    Hyperglycemia    mild-monitors A1C   Ileostomy in place [facility])    Mild mitral regurgitation by prior echocardiogram    Obesity    OSA on CPAP    Pneumonia    Rosacea    Sleep apnea    CPAP everynight    Surgical History: Past Surgical  History:  Procedure Laterality Date   APPENDECTOMY  08-1991   Along with colon removal   CATARACT EXTRACTION W/PHACO Left 02/06/2023   Procedure: CATARACT EXTRACTION PHACO AND INTRAOCULAR LENS PLACEMENT (IOC) LEFT DIABETIC 5.90 00:35.9;  Surgeon: Clair Crews, MD;  Location: [facility];  Service: Ophthalmology;  Laterality: Left;   CATARACT EXTRACTION W/PHACO Right 02/20/2023   Procedure: CATARACT EXTRACTION PHACO AND INTRAOCULAR LENS PLACEMENT (IOC) RIGHT DIABETIC 5.28 00:47.0;  Surgeon: Clair Crews, MD;  Location: [facility];  Service: Ophthalmology;  Laterality: Right;   CHOLECYSTECTOMY     COLECTOMY  1993   has ileostomy   COLON SURGERY  2021   removed rectum   COLONOSCOPY  08/1991   COLOSTOMY REVISION  12/06/2011   Procedure: COLOSTOMY REVISION;  Surgeon: Joyce Nixon, MD;  Location: [facility];  Service: General;  Laterality: Right;  OSTOMY revision   CYSTOSCOPY/URETEROSCOPY/HOLMIUM LASER/STENT PLACEMENT Left 04/11/2021   Procedure: CYSTOSCOPY/URETEROSCOPY/HOLMIUM LASER/STENT PLACEMENT;  Surgeon: Dustin Gimenez, MD;  Location: [facility];  Service: Urology;  Laterality: Left;   EXAMINATION UNDER ANESTHESIA  12/06/2011   Procedure: EXAM UNDER ANESTHESIA;  Surgeon: Joyce Nixon, MD;  Location: [facility];  Service: General;  Laterality: N/A;  rectal exam under anesthesia, anal dilation, pouchoscopy     SPINE SURGERY  05/12/2022   2 fractured vertebra   SQUAMOUS CELL CARCINOMA EXCISION  TOTAL ABDOMINAL HYSTERECTOMY  05/2006   for abscessed ovaries    Home Medications:  Allergies as of 06/11/2023       Reactions   Ciprofloxacin Rash   Erythema and pruritis around IV site, erythema and rash along vein   Other Rash   Jardiance  [empagliflozin ]    Increase in Cr    Lisinopril    REACTION: felt bad/ stomach hurt/ weak and tired   Metformin  And Related    GI   Sulfa  Antibiotics Rash        Medication List         Accurate as of Jun 11, 2023 11:59 PM. If you have any questions, ask your nurse or doctor.          STOP taking these medications    nitrofurantoin  (macrocrystal-monohydrate) 100 MG capsule Commonly known as: MACROBID        TAKE these medications    Accu-Chek Guide test strip Generic drug: glucose blood TO CHECK GLUCOSE DAILY AND AS NEEDED FOR TYPE 2 DIABETES   Accu-Chek Guide w/Device Kit To check glucose daily and as needed for type 2 diabetes   accu-chek multiclix lancets To check glucose daily and as needed for diabetes type 2   acetaminophen  325 MG tablet Commonly known as: TYLENOL  Take 650 mg by mouth every 6 (six) hours as needed for moderate pain.   atorvastatin  10 MG tablet Commonly known as: LIPITOR TAKE 1/2 PILL BY MOUTH EVERY OTHER DAY   B-12 1000 MCG Tabs Take 1 tablet by mouth daily.   esomeprazole 20 MG capsule Commonly known as: NEXIUM Take 20 mg by mouth every other day.   estradiol  0.1 MG/GM vaginal cream Commonly known as: ESTRACE  Estrogen Cream Instruction Discard applicator Apply pea sized amount to tip of finger to urethra before bed. Wash hands well after application. Use Monday, Wednesday and Friday   fluticasone  50 MCG/ACT nasal spray Commonly known as: FLONASE  Place 2 sprays into both nostrils at bedtime.   FreeStyle Libre 3 Sensor Misc Use as directed for diabetes type 2 requiring insulin    losartan  25 MG tablet Commonly known as: COZAAR  Take 25 mg by mouth daily.   Melatonin 1 MG Chew Chew 1.5 mg by mouth at bedtime as needed.   methenamine  1 g tablet Commonly known as: Hiprex  Take 1 tablet (1 g total) by mouth 2 (two) times daily with a meal.   metoprolol  tartrate 25 MG tablet Commonly known as: LOPRESSOR  TAKE 1 TABLET BY MOUTH EVERY DAY   multivitamin tablet Take 1 tablet by mouth daily.   NON FORMULARY SLEEPS WITH BI-PAP   phenazopyridine  200 MG tablet Commonly known as: Pyridium  Take 1 tablet (200 mg total)  by mouth 3 (three) times daily as needed for pain.   simethicone  80 MG chewable tablet Commonly known as: MYLICON Chew 1 tablet (80 mg total) by mouth 4 (four) times daily as needed for flatulence.   tirzepatide  15 MG/0.5ML Pen Commonly known as: MOUNJARO  Inject 15 mg into the skin once a week.   Vitamin D3 25 MCG (1000 UT) Caps Take 1,000 Units by mouth daily.   zolpidem  10 MG tablet Commonly known as: AMBIEN  Take 0.5 tablets (5 mg total) by mouth at bedtime as needed for sleep. Caution of sedation and falls        Allergies:  Allergies  Allergen Reactions   Ciprofloxacin Rash    Erythema and pruritis around IV site, erythema and rash along vein   Other Rash  Jardiance  [Empagliflozin ]     Increase in Cr    Lisinopril     REACTION: felt bad/ stomach hurt/ weak and tired   Metformin  And Related     GI   Sulfa  Antibiotics Rash    Family History: Family History  Problem Relation Age of Onset   Hypertension Mother    Hyperlipidemia Mother    Cirrhosis Mother        NASH   Diabetes Mother    Liver cancer Father        resection secondary to mets   Cancer Father        colon   Hyperlipidemia Father    Hypertension Father    Allergies Father    Ulcerative colitis Father    Arthritis Father    Allergies Brother    Gastric cancer Brother    Cancer Brother    Breast cancer Paternal Grandmother    Breast cancer Cousin 44       paternal cousins   ADD / ADHD Son     Social History:   reports that she has never smoked. She has never used smokeless tobacco. She reports that she does not drink alcohol and does not use drugs.  Physical Exam: BP (!) 198/65   Pulse 99   Ht 5\' 5"  (1.651 m)   Wt 262 lb (118.8 kg)   BMI 43.60 kg/m   Constitutional:  Alert and oriented, no acute distress, nontoxic appearing HEENT: [place], AT Cardiovascular: No clubbing, cyanosis, or edema Respiratory: Normal respiratory effort, no increased work of breathing Skin: No rashes, bruises  or suspicious lesions Neurologic: Grossly intact, no focal deficits, moving all 4 extremities Psychiatric: Normal mood and affect  Laboratory Data: Results for orders placed or performed in visit on 06/11/23  Microscopic Examination   Collection Time: 06/11/23  1:13 PM   Urine  Result Value Ref Range   WBC, UA >30 (A) 0 - 5 /hpf   RBC, Urine 0-2 0 - 2 /hpf   Epithelial Cells (non renal) >10 (A) 0 - 10 /hpf   Casts Present (A) None seen /lpf   Cast Type Hyaline casts N/A   Bacteria, UA Moderate (A) None seen/Few  Urinalysis, Complete   Collection Time: 06/11/23  1:13 PM  Result Value Ref Range   Specific Gravity, UA 1.030 1.005 - 1.030   pH, UA 6.0 5.0 - 7.5   Color, UA Yellow Yellow   Appearance Ur Slightly cloudy Clear   Leukocytes,UA Trace (A) Negative   Protein,UA Trace Negative/Trace   Glucose, UA Negative Negative   Ketones, UA Negative Negative   RBC, UA Negative Negative   Bilirubin, UA Negative Negative   Urobilinogen, Ur 0.2 0.2 - 1.0 mg/dL   Nitrite, UA Negative Negative   Microscopic Examination See below:   BLADDER SCAN AMB NON-IMAGING   Collection Time: 06/11/23  1:21 PM  Result Value Ref Range   Scan Result    Assessment & Plan:   1. Recurrent UTI (Primary) UA today appears contaminated, and was not obtained with sterile technique.  Will repeat culture and contact her with her results.  For UTI prevention, we discussed methenamine  to reduce the risk for antibiotic resistance.  She would like to try this.  May consider cath UAs in the future. - Urinalysis, Complete - BLADDER SCAN AMB NON-IMAGING - CULTURE, URINE COMPREHENSIVE - methenamine  (HIPREX ) 1 g tablet; Take 1 tablet (1 g total) by mouth 2 (two) times daily with a meal.  Dispense: 60 tablet; Refill: 5  Return in about 6 months (around 12/12/2023) for Follow up.  Kathreen Pare, PA-C  [facility] [place] [facility] [place] [address] [place], [place] [address] [phone]

## 2023-06-17 ENCOUNTER — Ambulatory Visit
Admission: EM | Admit: 2023-06-17 | Discharge: 2023-06-17 | Disposition: A | Discharge status: Home / Self Care | Attending: Emergency Medicine | Admitting: Emergency Medicine

## 2023-06-17 ENCOUNTER — Encounter: Payer: Self-pay | Admitting: *Deleted

## 2023-06-17 ENCOUNTER — Other Ambulatory Visit: Payer: Self-pay

## 2023-06-17 DIAGNOSIS — R350 Frequency of micturition: Secondary | ICD-10-CM | POA: Insufficient documentation

## 2023-06-17 DIAGNOSIS — N3000 Acute cystitis without hematuria: Secondary | ICD-10-CM | POA: Insufficient documentation

## 2023-06-17 LAB — POCT URINALYSIS DIP (MANUAL ENTRY)
Glucose, UA: 250 mg/dL — AB
Nitrite, UA: POSITIVE — AB
Protein Ur, POC: >=300 mg/dL — AB
Spec Grav, UA: 1.02
Urobilinogen, UA: 2 U/dL — AB
pH, UA: 5

## 2023-06-17 MED ORDER — CEPHALEXIN 500 MG PO CAPS
500.0000 mg | ORAL_CAPSULE | Freq: Two times a day (BID) | ORAL | 0 refills | Status: AC
Start: 2023-06-17 — End: 2023-06-27

## 2023-06-17 NOTE — ED Provider Notes (Signed)
 Jamie [name]    CSN: [id number] Arrival date & time: 06/17/23  1127      History   Chief Complaint Chief Complaint  Patient presents with   Dysuria   Urinary Frequency   Cystitis    HPI Jamie [name] is a 68 y.o. female.   Presents for evaluation of urinary frequency, dysuria beginning 1 day ago.  History of reoccurring infections, followed by urology, taking daily Hiprex .  Recent use of antibiotics, while symptoms that improved does not believe fully resolved.  Denies hematuria, abdominal or flank pain, fever or vaginal symptoms.  Has attempted Azo.  Past Medical History:  Diagnosis Date   Allergy 1970s   Sulfa  drugs   Anemia    Arthritis    generalized   Blood transfusion without reported diagnosis 1979   Bowel obstruction (HCC)    repeated (? possible adhesions) neg EGD   Cataract ~2018   Not ready for surgery yet   Chronic kidney disease    Cirrhosis of liver (HCC)    asymptomatic.  Visible on scans.   Colitis    with colectomy   Diabetes mellitus without complication (HCC)    Dyspnea    covid   Gall stones 01/23/2006   Gastroenteritis 07/25/2014   hosp for IVF    GERD (gastroesophageal reflux disease)    History of kidney stones    HTN (hypertension)    Hyperglycemia    mild-monitors A1C   Ileostomy in place [facility])    Mild mitral regurgitation by prior echocardiogram    Obesity    OSA on CPAP    Pneumonia    Rosacea    Sleep apnea    CPAP everynight    Patient Active Problem List   Diagnosis Date Noted   Facial trauma, sequela 04/16/2023   Diabetes mellitus treated with injections of non-insulin  medication (HCC) 12/17/2022   Pedal edema 12/15/2022   Abnormal finding on imaging of liver 10/12/2022   Long-term current use of injectable noninsulin antidiabetic medication 10/12/2022   Panic anxiety syndrome 07/20/2022   Encounter for screening mammogram for breast cancer 07/11/2022   Estrogen deficiency 07/11/2022   Low serum vitamin  B12 07/04/2022   Current use of proton pump inhibitor 07/03/2022   Low magnesium  level 06/06/2022   Closed tricolumnar fracture of lumbar vertebra (HCC) 05/12/2022   Compression fracture of L1 lumbar vertebra (HCC) 05/11/2022   Morbid obesity (HCC) 05/11/2022   Fall at home, initial encounter 05/11/2022   Chronic kidney disease, stage 3a (HCC) 05/11/2022   Closed fracture of first lumbar vertebra (HCC) 05/11/2022   Closed fracture of twelfth thoracic vertebra (HCC) 05/11/2022   Spinal instability, lumbar 05/11/2022   Hearing loss, right 04/07/2022   Abnormal SPEP 04/28/2021   Abnormal EKG 04/26/2021   CKD stage 3 due to type 2 diabetes mellitus (HCC) 05/31/2020   Aortic atherosclerosis (HCC) 03/01/2020   Elevated serum creatinine 05/14/2019   Dermatitis 07/01/2018   Left shoulder pain 11/30/2017   Palpitations 07/05/2017   Lichen planus 04/09/2017   Hyperlipidemia associated with type 2 diabetes mellitus (HCC) 04/10/2016   Red blood cell antibody positive 07/02/2013   Elevated C-reactive protein (CRP) 05/15/2012   Routine general medical examination at a health care facility 09/24/2011   Apnea 11/14/2010   Stress reaction, emotional 05/02/2010   Compulsive overeater 05/02/2010   Arthritis    H/O small bowel obstruction    KNEE PAIN 10/14/2009   DM (diabetes mellitus), type 2 with renal complications (  HCC) 04/23/2009   ROSACEA 09/12/2007   Essential hypertension 05/15/2007   OSA (obstructive sleep apnea) 12/13/2006   Ulcerative colitis (HCC) 07/12/2006    Past Surgical History:  Procedure Laterality Date   APPENDECTOMY  08-1991   Along with colon removal   CATARACT EXTRACTION W/PHACO Left 02/06/2023   Procedure: CATARACT EXTRACTION PHACO AND INTRAOCULAR LENS PLACEMENT (IOC) LEFT DIABETIC 5.90 00:35.9;  Surgeon: Clair Crews, MD;  Location: [facility];  Service: Ophthalmology;  Laterality: Left;   CATARACT EXTRACTION W/PHACO Right 02/20/2023   Procedure:  CATARACT EXTRACTION PHACO AND INTRAOCULAR LENS PLACEMENT (IOC) RIGHT DIABETIC 5.28 00:47.0;  Surgeon: Clair Crews, MD;  Location: [facility];  Service: Ophthalmology;  Laterality: Right;   CHOLECYSTECTOMY     COLECTOMY  1993   has ileostomy   COLON SURGERY  2021   removed rectum   COLONOSCOPY  08/1991   COLOSTOMY REVISION  12/06/2011   Procedure: COLOSTOMY REVISION;  Surgeon: Joyce Nixon, MD;  Location: [facility];  Service: General;  Laterality: Right;  OSTOMY revision   CYSTOSCOPY/URETEROSCOPY/HOLMIUM LASER/STENT PLACEMENT Left 04/11/2021   Procedure: CYSTOSCOPY/URETEROSCOPY/HOLMIUM LASER/STENT PLACEMENT;  Surgeon: Dustin Gimenez, MD;  Location: [facility];  Service: Urology;  Laterality: Left;   EXAMINATION UNDER ANESTHESIA  12/06/2011   Procedure: EXAM UNDER ANESTHESIA;  Surgeon: Joyce Nixon, MD;  Location: [facility];  Service: General;  Laterality: N/A;  rectal exam under anesthesia, anal dilation, pouchoscopy     SPINE SURGERY  05/12/2022   2 fractured vertebra   SQUAMOUS CELL CARCINOMA EXCISION     TOTAL ABDOMINAL HYSTERECTOMY  05/2006   for abscessed ovaries    OB History   No obstetric history on file.      Home Medications    Prior to Admission medications   Medication Sig Start Date End Date Taking? Authorizing Provider  ACCU-CHEK GUIDE test strip TO CHECK GLUCOSE DAILY AND AS NEEDED FOR TYPE 2 DIABETES 02/13/22  Yes Tower, Manley Seeds, MD  acetaminophen  (TYLENOL ) 325 MG tablet Take 650 mg by mouth every 6 (six) hours as needed for moderate pain.   Yes [provider]  atorvastatin  (LIPITOR) 10 MG tablet TAKE 1/2 PILL BY MOUTH EVERY OTHER DAY 11/16/22  Yes Tower, Manley Seeds, MD  Blood Glucose Monitoring Suppl (ACCU-CHEK GUIDE) w/Device KIT To check glucose daily and as needed for type 2 diabetes 06/26/19  Yes Tower, Manley Seeds, MD  cephALEXin  (KEFLEX ) 500 MG capsule Take 1 capsule (500 mg total) by mouth 2 (two) times daily  for 10 days. 06/17/23 06/27/23 Yes Anni Hocevar R, NP  Cholecalciferol  (VITAMIN D3) 25 MCG (1000 UT) CAPS Take 1,000 Units by mouth daily.   Yes [provider]  Continuous Glucose Sensor (FREESTYLE LIBRE 3 SENSOR) MISC Use as directed for diabetes type 2 requiring insulin  12/26/22  Yes Tower, Manley Seeds, MD  Cyanocobalamin  (B-12) 1000 MCG TABS Take 1 tablet by mouth daily.   Yes [provider]  esomeprazole (NEXIUM) 20 MG capsule Take 20 mg by mouth every other day.   Yes [provider]  estradiol  (ESTRACE ) 0.1 MG/GM vaginal cream Estrogen Cream Instruction Discard applicator Apply pea sized amount to tip of finger to urethra before bed. Wash hands well after application. Use Monday, Wednesday and Friday 05/02/23  Yes Dustin Gimenez, MD  fluticasone  (FLONASE ) 50 MCG/ACT nasal spray Place 2 sprays into both nostrils at bedtime.   Yes [provider]  Lancets (ACCU-CHEK MULTICLIX) lancets To check glucose daily and as  needed for diabetes type 2 06/26/19  Yes Tower, Manley Seeds, MD  losartan  (COZAAR ) 25 MG tablet Take 25 mg by mouth daily. 02/15/21  Yes [provider]  Melatonin 1 MG CHEW Chew 1.5 mg by mouth at bedtime as needed.   Yes [provider]  methenamine  (HIPREX ) 1 g tablet Take 1 tablet (1 g total) by mouth 2 (two) times daily with a meal. 06/11/23  Yes Vaillancourt, Samantha, PA-C  metoprolol  tartrate (LOPRESSOR ) 25 MG tablet TAKE 1 TABLET BY MOUTH EVERY DAY 05/15/23  Yes Tower, Manley Seeds, MD  Multiple Vitamin (MULTIVITAMIN) tablet Take 1 tablet by mouth daily.   Yes [provider]  NON FORMULARY SLEEPS WITH BI-PAP   Yes [provider]  phenazopyridine  (PYRIDIUM ) 200 MG tablet Take 1 tablet (200 mg total) by mouth 3 (three) times daily as needed for pain. 08/02/22  Yes Vaillancourt, Samantha, PA-C  simethicone  (MYLICON) 80 MG chewable tablet Chew 1 tablet (80 mg total) by mouth 4 (four) times daily as needed for flatulence. 05/24/22   Yes Darus Engels A, DO  tirzepatide  (MOUNJARO ) 15 MG/0.5ML Pen Inject 15 mg into the skin once a week. 04/23/23  Yes Tower, Manley Seeds, MD  zolpidem  (AMBIEN ) 10 MG tablet Take 0.5 tablets (5 mg total) by mouth at bedtime as needed for sleep. Caution of sedation and falls 07/11/22  Yes Tower, Manley Seeds, MD    Family History Family History  Problem Relation Age of Onset   Hypertension Mother    Hyperlipidemia Mother    Cirrhosis Mother        NASH   Diabetes Mother    Liver cancer Father        resection secondary to mets   Cancer Father        colon   Hyperlipidemia Father    Hypertension Father    Allergies Father    Ulcerative colitis Father    Arthritis Father    Allergies Brother    Gastric cancer Brother    Cancer Brother    Breast cancer Paternal Grandmother    Breast cancer Cousin 67       paternal cousins   ADD / ADHD Son     Social History Social History   Tobacco Use   Smoking status: Never   Smokeless tobacco: Never  Vaping Use   Vaping status: Never Used  Substance Use Topics   Alcohol use: No   Drug use: Never     Allergies   Ciprofloxacin, Other, Jardiance  [empagliflozin ], Lisinopril, Metformin  and related, and Sulfa  antibiotics   Review of Systems Review of Systems  Genitourinary:  Positive for dysuria and frequency.     Physical Exam Triage Vital Signs ED Triage Vitals  Encounter Vitals Group     BP 06/17/23 1146 139/75     Systolic BP Percentile --      Diastolic BP Percentile --      Pulse Rate 06/17/23 1146 88     Resp 06/17/23 1146 20     Temp 06/17/23 1146 98.8 F (37.1 C)     Temp src --      SpO2 06/17/23 1146 98 %     Weight --      Height --      Head Circumference --      Peak Flow --      Pain Score 06/17/23 1142 0     Pain Loc --      Pain Education --  Exclude from Growth Chart --    No data found.  Updated Vital Signs BP 139/75   Pulse 88   Temp 98.8 F (37.1 C)   Resp 20   SpO2 98%   Visual  Acuity Right Eye Distance:   Left Eye Distance:   Bilateral Distance:    Right Eye Near:   Left Eye Near:    Bilateral Near:     Physical Exam Constitutional:      Appearance: Normal appearance.  Eyes:     Extraocular Movements: Extraocular movements intact.  Pulmonary:     Effort: Pulmonary effort is normal.  Abdominal:     Tenderness: There is no right CVA tenderness, left CVA tenderness or guarding.  Neurological:     Mental Status: She is alert and oriented to person, place, and time. Mental status is at baseline.      UC Treatments / Results  Labs (all labs ordered are listed, but only abnormal results are displayed) Labs Reviewed  POCT URINALYSIS DIP (MANUAL ENTRY) - Abnormal; Notable for the following components:      Result Value   Color, UA red (*)    Clarity, UA cloudy (*)    Glucose, UA =250 (*)    Bilirubin, UA small (*)    Ketones, POC UA small (15) (*)    Blood, UA moderate (*)    Protein Ur, POC >=300 (*)    Urobilinogen, UA 2.0 (*)    Nitrite, UA Positive (*)    Leukocytes, UA Large (3+) (*)    All other components within normal limits  URINE CULTURE    EKG   Radiology No results found.  Procedures Procedures (including critical care time)  Medications Ordered in UC Medications - No data to display  Initial Impression / Assessment and Plan / UC Course  I have reviewed the triage vital signs and the nursing notes.  Pertinent labs & imaging results that were available during my care of the patient were reviewed by me and considered in my medical decision making (see chart for details).  Urinary  frequency, acute cystitis without hematuria  Urinalysis showing leukocytes and nitrates, sent for culture, initiating cephalexin  extending course to 10 days, recommended continued use of daily medication, recommended supportive care Final Clinical Impressions(s) / UC Diagnoses   Final diagnoses:  Urinary frequency   Discharge Instructions       Your urinalysis shows Madisin Hasan blood cells and nitrates which are indicative of infection, your urine will be sent to the lab to determine exactly which bacteria is present, if any changes need to be made to your medications you will be notified  Begin use of keflex  twice daily for 10 days  You may use over-the-counter Azo to help minimize your symptoms until antibiotic removes bacteria, this medication will turn your urine orange  Increase your fluid intake through use of water  As always practice good hygiene, wiping front to back and avoidance of scented vaginal products to prevent further irritation  If symptoms continue to persist after use of medication or recur please follow-up with urgent care or your primary doctor as needed   ED Prescriptions     Medication Sig Dispense Auth. Provider   cephALEXin  (KEFLEX ) 500 MG capsule Take 1 capsule (500 mg total) by mouth 2 (two) times daily for 10 days. 20 capsule Akiba Melfi R, NP      PDMP not reviewed this encounter.   Reena Canning, [place] 06/17/23 [phone]

## 2023-06-17 NOTE — ED Triage Notes (Signed)
 PT reports she has dysuria ,bladder spasm and frequency. Pt saw the URO last week and is taking Hiprex  1 GM  2 times a day. PT did not take med yesterday.

## 2023-06-17 NOTE — Discharge Instructions (Addendum)
 Your urinalysis shows Jamie [name] blood cells and nitrates which are indicative of infection, your urine will be sent to the lab to determine exactly which bacteria is present, if any changes need to be made to your medications you will be notified  Begin use of keflex  twice daily for 10 days  You may use over-the-counter Azo to help minimize your symptoms until antibiotic removes bacteria, this medication will turn your urine orange  Increase your fluid intake through use of water  As always practice good hygiene, wiping front to back and avoidance of scented vaginal products to prevent further irritation  If symptoms continue to persist after use of medication or recur please follow-up with urgent care or your primary doctor as needed

## 2023-06-19 ENCOUNTER — Ambulatory Visit: Payer: Self-pay

## 2023-06-19 ENCOUNTER — Ambulatory Visit: Payer: Self-pay | Admitting: Physician Assistant

## 2023-06-19 LAB — URINE CULTURE: Culture: >=100000 — AB

## 2023-06-21 DIAGNOSIS — K760 Fatty (change of) liver, not elsewhere classified: Secondary | ICD-10-CM | POA: Diagnosis not present

## 2023-06-21 DIAGNOSIS — K7460 Unspecified cirrhosis of liver: Secondary | ICD-10-CM | POA: Diagnosis not present

## 2023-07-02 IMAGING — CT CT ABD-PELV W/O CM
2 of 4 series · 15 of 46 positions shown, 17 images · non-contrast
Comparison: CT abdomen pelvis 09/11/2008.

CLINICAL DATA: Abdominal pain, acute, nonlocalized Abd pain, N/V



[Series 2: routine abd/pel wo · axial · 0.82mm/px · z∈[-505,-85]mm · 12 of 92 slices shown, 14 images]
[im 4/92  soft-tissue]
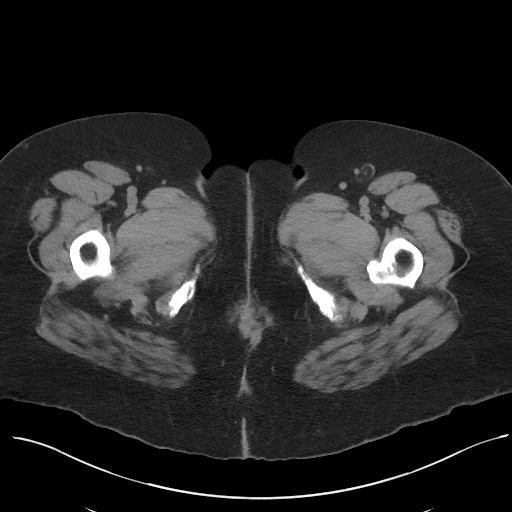
[im 4/92  bone]
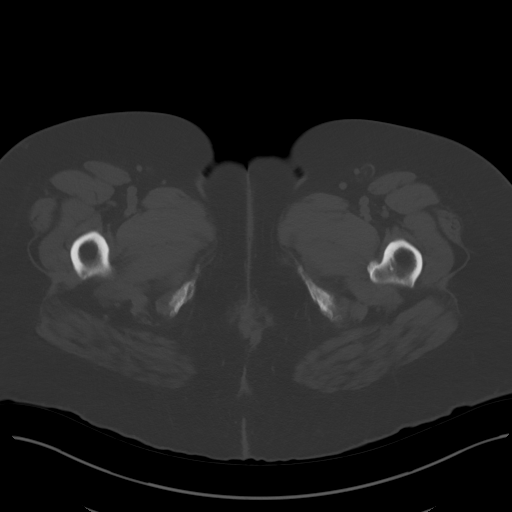
[im 12/92  soft-tissue]
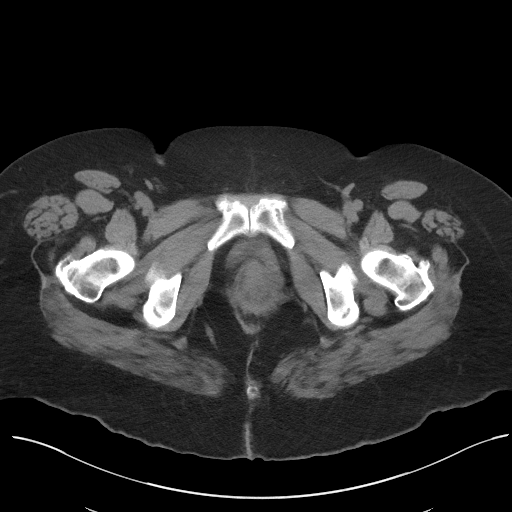
[im 20/92  soft-tissue]
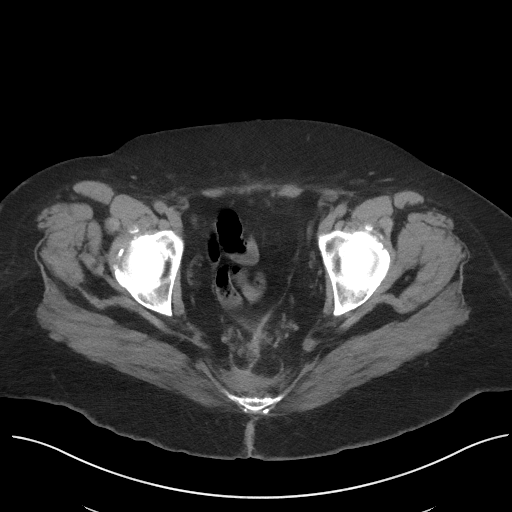
[im 28/92  soft-tissue]
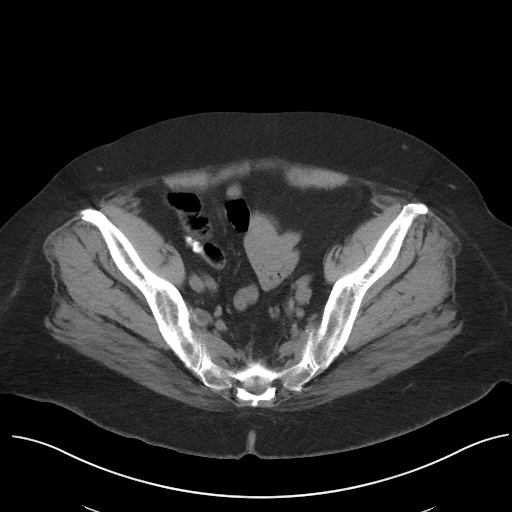
[im 36/92  soft-tissue]
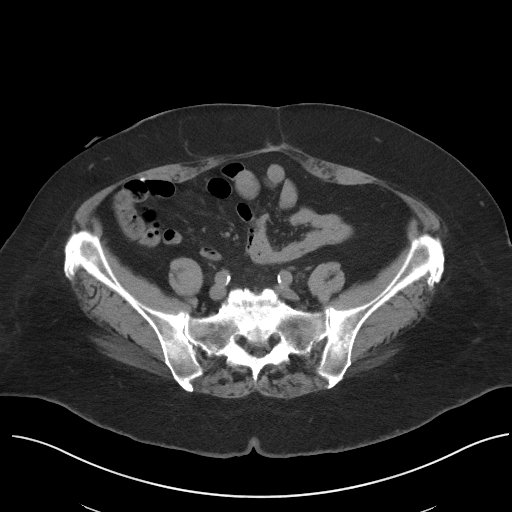
[im 44/92  soft-tissue]
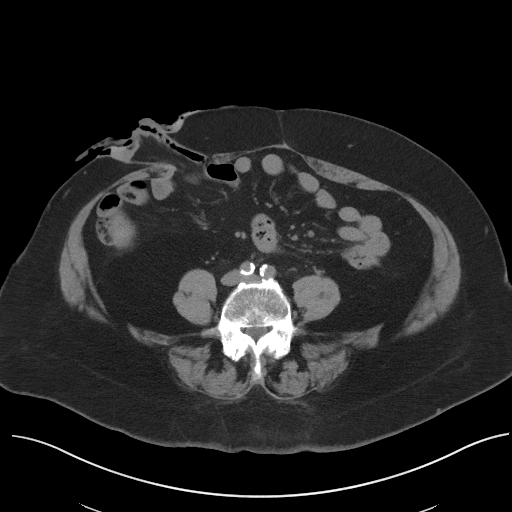
[im 48/92  soft-tissue]
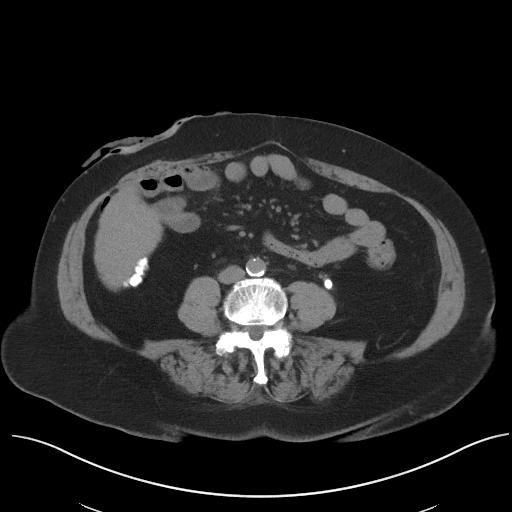
[im 56/92  soft-tissue]
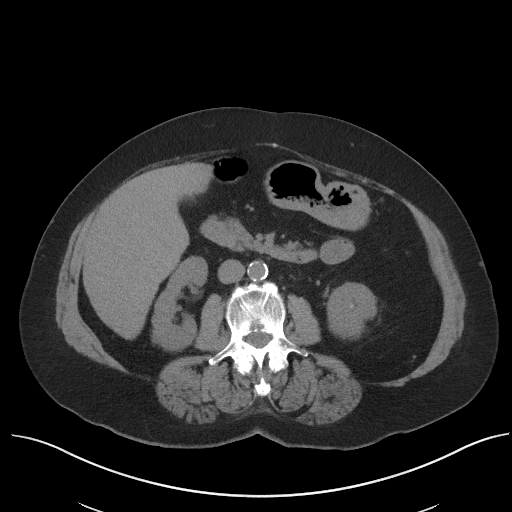
[im 64/92  soft-tissue]
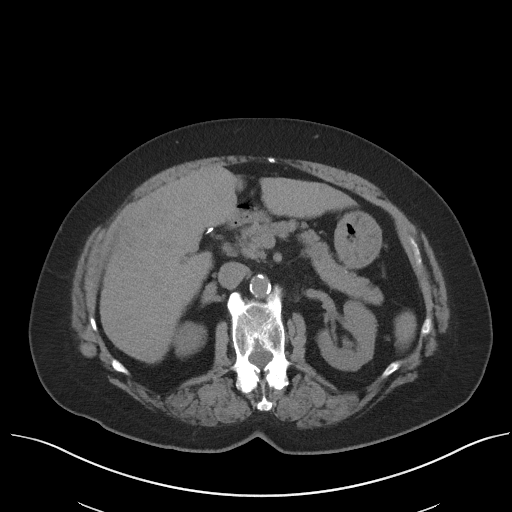
[im 64/92  bone]
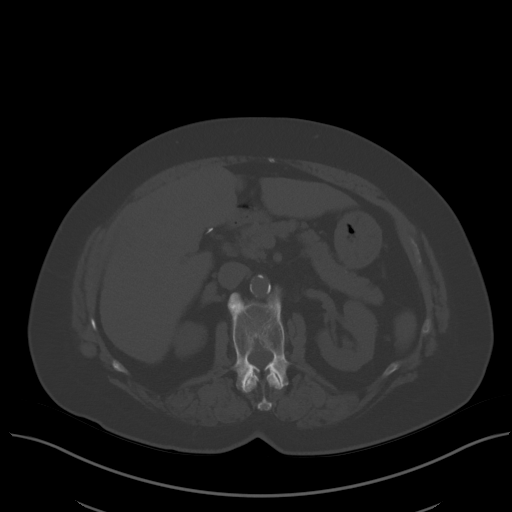
[im 72/92  soft-tissue]
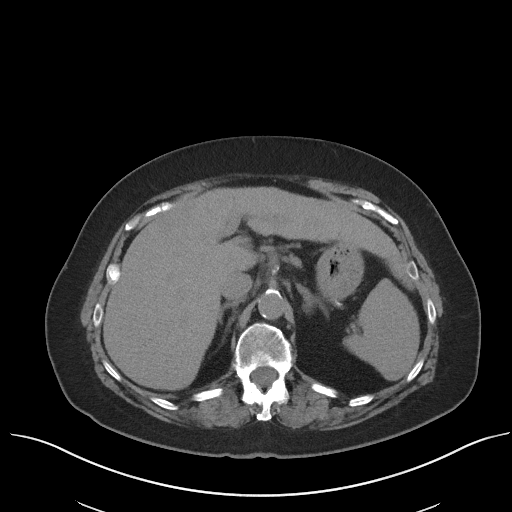
[im 80/92  soft-tissue]
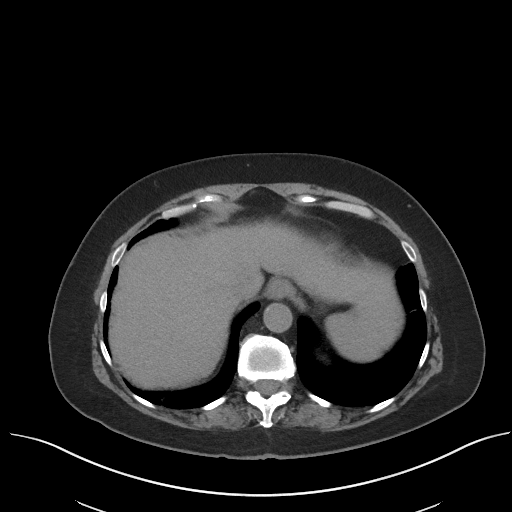
[im 88/92  soft-tissue]
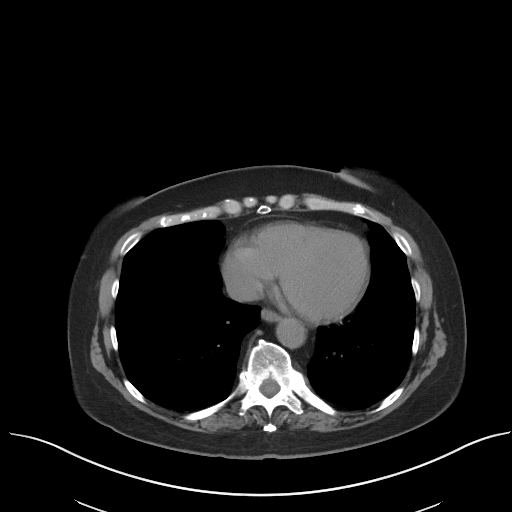

[Series 5: coronal st · coronal · 0.80mm/px · 3 of 96 slices shown]
[im 32/96  soft-tissue]
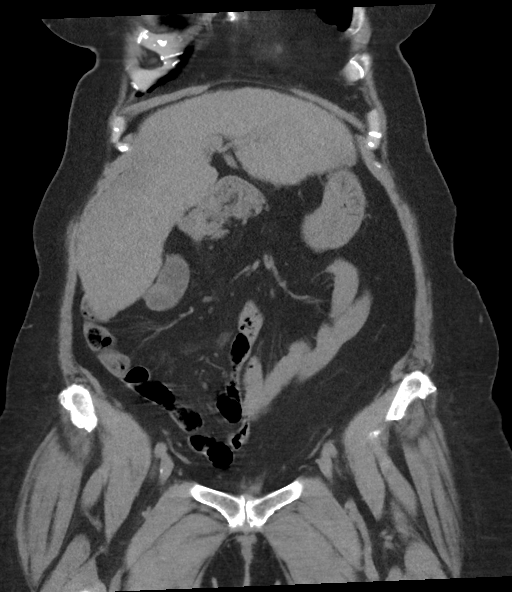
[im 43/96  soft-tissue]
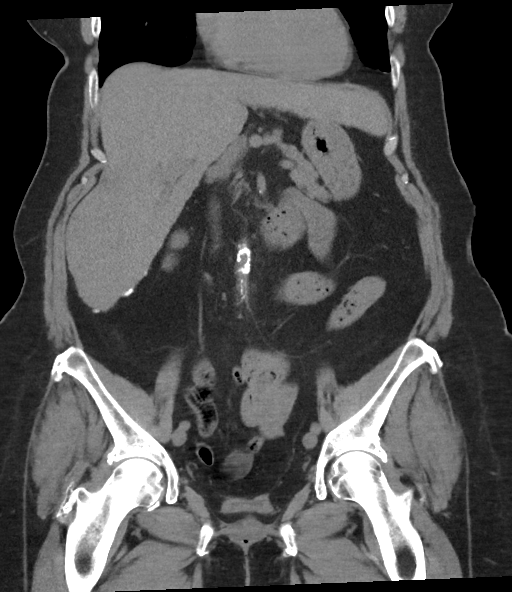
[im 53/96  soft-tissue]
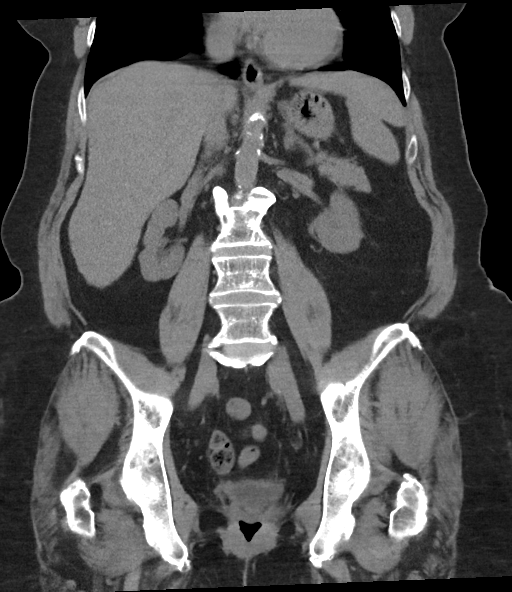

[15 of 46 positions shown; findings below may reference images not displayed]

FINDINGS: Lower chest: Bibasilar linear scarring/atelectasis. No acute
abnormality.

Hepatobiliary: Mildly nodular contours. There are is infiltrative,
ill-defined hypodensity within the right and left hepatic lobes
(series 2, images 24-29). Prior cholecystectomy.

Pancreas: Unremarkable. No pancreatic ductal dilatation or
surrounding inflammatory changes.

Spleen: Normal in size without focal abnormality.

Adrenals/Urinary Tract: Adrenal glands are unremarkable. No
hydronephrosis. There is an 8 mm stone within the left proximal
ureter. There are additional more faint calcifications in the lower
pole the left kidney which appear nonobstructive. There is no
hydroureteronephrosis. The bladder is minimally distended.

Stomach/Bowel: The stomach is within normal limits. Prior colectomy
with right lower quadrant ileostomy. There is no evidence of bowel
obstruction.

Vascular/Lymphatic: Aortoiliac atherosclerotic calcifications. No
AAA. No lymphadenopathy.

Reproductive: Prior hysterectomy.

Other: No bowel containing hernia. No free air. No abdominopelvic
ascites. There are multiple intraperitoneal calcifications notable
along the inferior edge of the right hepatic lobe (series 2, image
40 2-44), and in the right lower quadrant (series 2, images 56-64).
These are favored represent dropped gallstones. No evidence of
associated inflammation or abscess.

Musculoskeletal: No acute osseous abnormality. No suspicious lytic
or blastic lesions. Multilevel degenerative changes of the spine.
Bilateral hip osteoarthritis.
IMPRESSION: No acute abdominopelvic abnormality.

Prior colectomy with right lower quadrant ileostomy. No bowel
containing parastomal hernia. No evidence of bowel obstruction.

Ill-defined hypoattenuation within the right and left hepatic lobes
suggestive of fatty infiltration. This can be confirmed with
outpatient, nonemergent MRI.

Nonobstructive 8 mm stone in the left proximal ureter. No
hydroureteronephrosis. Probable additional nonobstructive left lower
pole renal stones. A stone of this size in the ureter would be
expected to be obstructive, thus given no hydroureteronephrosis this
could reflect a poorly functioning left kidney. Correlate with
metabolic panel.

Prior cholecystectomy. Multiple intraperitoneal calcifications along
the inferior edge of the right hepatic lobe and right lower
quadrant, favored to represent dropped gallstones.

## 2023-07-08 ENCOUNTER — Telehealth: Payer: Self-pay | Admitting: Family Medicine

## 2023-07-08 DIAGNOSIS — I10 Essential (primary) hypertension: Secondary | ICD-10-CM

## 2023-07-08 DIAGNOSIS — Z79899 Other long term (current) drug therapy: Secondary | ICD-10-CM

## 2023-07-08 DIAGNOSIS — Z7985 Type 2 diabetes mellitus without complications: Secondary | ICD-10-CM

## 2023-07-08 DIAGNOSIS — E1169 Type 2 diabetes mellitus with other specified complication: Secondary | ICD-10-CM

## 2023-07-08 DIAGNOSIS — N183 Chronic kidney disease, stage 3 unspecified: Secondary | ICD-10-CM

## 2023-07-08 DIAGNOSIS — E538 Deficiency of other specified B group vitamins: Secondary | ICD-10-CM

## 2023-07-08 NOTE — Telephone Encounter (Signed)
-----   Message from Gerry Krone sent at 07/02/2023 10:08 AM EDT ----- Regarding: Lab orders for [facility], 6.19.25 Patient is scheduled for CPX labs, please order future labs, Thanks , Anselmo Kings

## 2023-07-12 ENCOUNTER — Ambulatory Visit: Payer: Self-pay | Admitting: Family Medicine

## 2023-07-12 ENCOUNTER — Other Ambulatory Visit

## 2023-07-12 DIAGNOSIS — E538 Deficiency of other specified B group vitamins: Secondary | ICD-10-CM

## 2023-07-12 DIAGNOSIS — E1169 Type 2 diabetes mellitus with other specified complication: Secondary | ICD-10-CM | POA: Diagnosis not present

## 2023-07-12 DIAGNOSIS — E785 Hyperlipidemia, unspecified: Secondary | ICD-10-CM

## 2023-07-12 DIAGNOSIS — E119 Type 2 diabetes mellitus without complications: Secondary | ICD-10-CM

## 2023-07-12 DIAGNOSIS — I10 Essential (primary) hypertension: Secondary | ICD-10-CM

## 2023-07-12 DIAGNOSIS — Z7985 Long-term (current) use of injectable non-insulin antidiabetic drugs: Secondary | ICD-10-CM

## 2023-07-12 LAB — CBC WITH DIFFERENTIAL/PLATELET
Basophils Absolute: 0 10*3/uL (ref 0.0–0.1)
Basophils Relative: 0.6 % (ref 0.0–3.0)
Eosinophils Absolute: 0.2 10*3/uL (ref 0.0–0.7)
Eosinophils Relative: 2.3 % (ref 0.0–5.0)
HCT: 40.7 % (ref 36.0–46.0)
Hemoglobin: 13.6 g/dL (ref 12.0–15.0)
Lymphocytes Relative: 31.1 % (ref 12.0–46.0)
Lymphs Abs: 2.2 10*3/uL (ref 0.7–4.0)
MCHC: 33.3 g/dL (ref 30.0–36.0)
MCV: 94.2 fl (ref 78.0–100.0)
Monocytes Absolute: 0.6 10*3/uL (ref 0.1–1.0)
Monocytes Relative: 9.3 % (ref 3.0–12.0)
Neutro Abs: 4 10*3/uL (ref 1.4–7.7)
Neutrophils Relative %: 56.7 % (ref 43.0–77.0)
Platelets: 205 10*3/uL (ref 150.0–400.0)
RBC: 4.32 Mil/uL (ref 3.87–5.11)
RDW: 14.2 % (ref 11.5–15.5)
WBC: 7 10*3/uL (ref 4.0–10.5)

## 2023-07-12 LAB — LIPID PANEL
Cholesterol: 129 mg/dL (ref 0–200)
HDL: 53.1 mg/dL (ref >39.00)
LDL Cholesterol: 33 mg/dL (ref 0–99)
NonHDL: 75.85
Total CHOL/HDL Ratio: 2
Triglycerides: 215 mg/dL — ABNORMAL HIGH (ref 0.0–149.0)
VLDL: 43 mg/dL — ABNORMAL HIGH (ref 0.0–40.0)

## 2023-07-12 LAB — COMPREHENSIVE METABOLIC PANEL WITH GFR
ALT: 23 U/L (ref 0–35)
AST: 28 U/L (ref 0–37)
Albumin: 3.9 g/dL (ref 3.5–5.2)
Alkaline Phosphatase: 86 U/L (ref 39–117)
BUN: 19 mg/dL (ref 6–23)
CO2: 21 meq/L (ref 19–32)
Calcium: 9.5 mg/dL (ref 8.4–10.5)
Chloride: 106 meq/L (ref 96–112)
Creatinine, Ser: 1.33 mg/dL — ABNORMAL HIGH (ref 0.40–1.20)
GFR: 40.94 mL/min — ABNORMAL LOW (ref >60.00)
Glucose, Bld: 190 mg/dL — ABNORMAL HIGH (ref 70–99)
Potassium: 4.3 meq/L (ref 3.5–5.1)
Sodium: 136 meq/L (ref 135–145)
Total Bilirubin: 0.9 mg/dL (ref 0.2–1.2)
Total Protein: 6.4 g/dL (ref 6.0–8.3)

## 2023-07-12 LAB — VITAMIN B12: Vitamin B-12: 487 pg/mL (ref 211–911)

## 2023-07-12 LAB — TSH: TSH: 3.49 u[IU]/mL (ref 0.35–5.50)

## 2023-07-12 LAB — HEMOGLOBIN A1C: Hgb A1c MFr Bld: 7.4 % — ABNORMAL HIGH (ref 4.6–6.5)

## 2023-07-17 DIAGNOSIS — R0602 Shortness of breath: Secondary | ICD-10-CM | POA: Diagnosis not present

## 2023-07-17 DIAGNOSIS — Z713 Dietary counseling and surveillance: Secondary | ICD-10-CM | POA: Diagnosis not present

## 2023-07-17 DIAGNOSIS — K7460 Unspecified cirrhosis of liver: Secondary | ICD-10-CM | POA: Diagnosis not present

## 2023-07-17 DIAGNOSIS — K760 Fatty (change of) liver, not elsewhere classified: Secondary | ICD-10-CM | POA: Diagnosis not present

## 2023-07-19 ENCOUNTER — Ambulatory Visit: Admitting: Family Medicine

## 2023-07-19 ENCOUNTER — Encounter: Payer: Self-pay | Admitting: Family Medicine

## 2023-07-19 VITALS — BP 118/68 | HR 76 | Temp 97.8°F | Ht 64.0 in | Wt 262.5 lb

## 2023-07-19 DIAGNOSIS — Z1231 Encounter for screening mammogram for malignant neoplasm of breast: Secondary | ICD-10-CM

## 2023-07-19 DIAGNOSIS — F5089 Other specified eating disorder: Secondary | ICD-10-CM

## 2023-07-19 DIAGNOSIS — L719 Rosacea, unspecified: Secondary | ICD-10-CM

## 2023-07-19 DIAGNOSIS — Z0000 Encounter for general adult medical examination without abnormal findings: Secondary | ICD-10-CM | POA: Diagnosis not present

## 2023-07-19 DIAGNOSIS — K5190 Ulcerative colitis, unspecified, without complications: Secondary | ICD-10-CM | POA: Diagnosis not present

## 2023-07-19 DIAGNOSIS — Z79899 Other long term (current) drug therapy: Secondary | ICD-10-CM | POA: Diagnosis not present

## 2023-07-19 DIAGNOSIS — N183 Chronic kidney disease, stage 3 unspecified: Secondary | ICD-10-CM

## 2023-07-19 DIAGNOSIS — I10 Essential (primary) hypertension: Secondary | ICD-10-CM | POA: Diagnosis not present

## 2023-07-19 DIAGNOSIS — E1122 Type 2 diabetes mellitus with diabetic chronic kidney disease: Secondary | ICD-10-CM | POA: Diagnosis not present

## 2023-07-19 DIAGNOSIS — E1169 Type 2 diabetes mellitus with other specified complication: Secondary | ICD-10-CM | POA: Diagnosis not present

## 2023-07-19 DIAGNOSIS — Z932 Ileostomy status: Secondary | ICD-10-CM | POA: Diagnosis not present

## 2023-07-19 DIAGNOSIS — E119 Type 2 diabetes mellitus without complications: Secondary | ICD-10-CM | POA: Diagnosis not present

## 2023-07-19 DIAGNOSIS — R932 Abnormal findings on diagnostic imaging of liver and biliary tract: Secondary | ICD-10-CM | POA: Diagnosis not present

## 2023-07-19 DIAGNOSIS — G4733 Obstructive sleep apnea (adult) (pediatric): Secondary | ICD-10-CM | POA: Diagnosis not present

## 2023-07-19 DIAGNOSIS — E785 Hyperlipidemia, unspecified: Secondary | ICD-10-CM

## 2023-07-19 DIAGNOSIS — E538 Deficiency of other specified B group vitamins: Secondary | ICD-10-CM

## 2023-07-19 DIAGNOSIS — K51019 Ulcerative (chronic) pancolitis with unspecified complications: Secondary | ICD-10-CM

## 2023-07-19 DIAGNOSIS — R0609 Other forms of dyspnea: Secondary | ICD-10-CM | POA: Insufficient documentation

## 2023-07-19 DIAGNOSIS — Z7985 Long-term (current) use of injectable non-insulin antidiabetic drugs: Secondary | ICD-10-CM

## 2023-07-19 MED ORDER — METRONIDAZOLE 1 % EX GEL: Freq: Every day | CUTANEOUS | 2 refills | Status: AC

## 2023-07-19 NOTE — Patient Instructions (Addendum)
 Stay active/ keep exercising  Add some strength training to your routine, this is important for bone and brain health and can reduce your risk of falls and help your body use insulin  properly and regulate weight  Light weights, exercise bands , and internet videos are a good way to start  Yoga (chair or regular), machines , floor exercises or a gym with machines are also good options   If you cannot add some strength training then we will have to hold the mounjaro    [facility] is the name of the gym in [place] to check out   See the pulmonary doctor about your shortness of breath   Follow up in 3 months   I sent in metro gel for rosacea     You have an order for:  [x]   3D Mammogram  []   Bone Density     Please call for appointment:   [x]   [facility]  [address] [place] [address]  [phone]  []   [facility])   [address]  [place], [place] [address]  [phone]  []   [facility]      [address] [place], [place]        [phone]         []   [facility]  [address] [place], [place]  [phone]  []  [facility]   [address]   [place], [place] [address]  [phone]  []  [facility]  [address] [place], [place] [address] [phone]    Make sure to wear two piece clothing  No lotions powders or deodorants the day of the appointment Make sure to bring picture ID and insurance card.  Bring list of medications you are currently taking including any supplements.   Schedule your screening mammogram through MyChart!   Select [place] imaging sites can now be scheduled through MyChart.  Log into your MyChart account.  Go to 'Visit' (or 'Appointments' if  on mobile App) --> Schedule  an  Appointment  Under 'Select a Reason for Visit' choose the Mammogram  Screening option.  Complete the pre-visit questions  and select the time and place that  best fits your schedule

## 2023-07-19 NOTE — Assessment & Plan Note (Signed)
 GFR of 40.9- improved from last check here Continues nephrology follow up and blood pressure control  Dm is not optimally controlled

## 2023-07-19 NOTE — Assessment & Plan Note (Signed)
 Discussed how this problem influences overall health and the risks it imposes  Reviewed plan for weight loss with lower calorie diet (via better food choices (lower glycemic and portion control) along with exercise building up to or more than 30 minutes 5 days per week including some aerobic activity and strength training   Working with dietician Taking moujaro   Encouraged strength building exercise if able

## 2023-07-19 NOTE — Progress Notes (Signed)
 Subjective:    Patient ID: Jamie [name], female    DOB: [birth year], 68 y.o.   MRN: [id number]  HPI  Here for health maintenance exam and to review chronic medical problems   Wt Readings from Last 3 Encounters:  07/19/23 262 lb 8 oz (119.1 kg)  06/11/23 262 lb (118.8 kg)  04/16/23 262 lb 2 oz (118.9 kg)   45.06 kg/m  Vitals:   07/19/23 0958  BP: 118/68  Pulse: 76  Temp: 97.8 F (36.6 C)  SpO2: 98%    Immunization History  Administered Date(s) Administered   Influenza Inj Mdck Quad Pf 10/02/2018, 12/13/2018   Influenza Split 10/13/2010, 10/02/2011, 10/23/2013   Influenza Whole 12/13/2006, 11/04/2009   Influenza, High Dose Seasonal PF 09/29/2019, 10/12/2020, 10/25/2021, 10/10/2022   Influenza,inj,Quad PF,6+ Mos 10/02/2012, 11/04/2014, 10/11/2017, 10/02/2018   Influenza-Unspecified 10/08/2015, 10/02/2016, 09/29/2019   Moderna Covid-19 Vaccine Bivalent Booster 64yrs & up 10/12/2020   PFIZER Comirnaty(Gray Top)Covid-19 Tri-Sucrose Vaccine 05/31/2020   PFIZER(Purple Top)SARS-COV-2 Vaccination 04/14/2019, 05/05/2019, 11/18/2019   PNEUMOCOCCAL CONJUGATE-20 12/08/2021   Pfizer Covid-19 Vaccine Bivalent Booster 70yrs & up 10/25/2021   Pfizer(Comirnaty)Fall Seasonal Vaccine 12 years and older 10/10/2022   Pneumococcal Polysaccharide-23 12/09/2013   Respiratory Syncytial Virus Vaccine,Recomb Aduvanted(Arexvy) 12/08/2021   Td 01/23/2002   Tdap 10/02/2012, 11/23/2022   Unspecified SARS-COV-2 Vaccination 10/10/2022   Zoster Recombinant(Shingrix) 10/25/2019, 01/06/2020   Zoster, Live 01/08/2015    Health Maintenance Due  Topic Date Due   Hepatitis B Vaccines (1 of 3 - Risk 3-dose series) Never done   OPHTHALMOLOGY EXAM  12/24/2022     Mammogram 08/2022  Self breast exam- no lumps   Gyn health No problems Uses estrace  cream - gets frequent utis  Now on hiprex  and cranberry to prevent    Colon cancer screening -pt had colectomy for UC in past  Antibiotics recently  caused increased yeast problems  Some trouble keeping bag on  Uses powder   Bone health  Dexa  08/2022 -normal bmd  Falls- fell right after cataract surgery (could not see)- landed on face and tore rotator cuff but no fractures  Injured face but no fracture  Fractures-none  Remote history of spinal compression fracture  Supplements -vitamin D  3 Last vitamin D  Lab Results  Component Value Date   VD25OH 45.62 07/04/2022    Exercise  Walking  Uses a glider also  Has bands    More shortness of breath with exertion lately  For quite some time but worse recently  At least 2021 (after abd surg at [facility])  No wheezing or chest sympotoms   Has been to cardiology  Echo 01/2023   Sees pulmonologist for her cpap -appointment in August    Derm care Had [facility] on chest-removed   Mood    04/16/2023    2:31 PM 12/15/2022    8:05 AM 10/12/2022   10:01 AM 10/05/2022    2:36 PM 07/11/2022   11:00 AM  Depression screen PHQ 2/9  Decreased Interest 0 0 0 0 0  Down, Depressed, Hopeless 0 0 0 0 1  PHQ - 2 Score 0 0 0 0 1  Altered sleeping 0 0 0  2  Tired, decreased energy 0 0 0  0  Change in appetite 0 0 0  1  Feeling bad or failure about yourself  0 0 0  0  Trouble concentrating 0 0 0  1  Moving slowly or fidgety/restless 0 0 0  0  Suicidal thoughts 0 0  0  0  PHQ-9 Score 0 0 0  5  Difficult doing work/chores Not difficult at all Not difficult at all Not difficult at all  Not difficult at all      04/16/2023    2:31 PM 12/15/2022    8:05 AM 10/12/2022   10:02 AM 07/11/2022   11:01 AM  GAD 7 : Generalized Anxiety Score  Nervous, Anxious, on Edge 0 0 0 1  Control/stop worrying 0 0 0 0  Worry too much - different things 0 0 0 0  Trouble relaxing 0 0 0 0  Restless 0 0 0 0  Easily annoyed or irritable 0 0 0 0  Afraid - awful might happen 0 0 0 0  Total GAD 7 Score 0 0 0 1  Anxiety Difficulty Not difficult at all Not difficult at all Not difficult at all Not difficult at all    Ambien  for sleep     HTN bp is stable today  No cp or palpitations or headaches or edema  No side effects to medicines  BP Readings from Last 3 Encounters:  07/19/23 118/68  06/17/23 139/75  06/11/23 (!) 198/65     Lab Results  Component Value Date   NA 136 07/12/2023   K 4.3 07/12/2023   CO2 21 07/12/2023   GLUCOSE 190 (H) 07/12/2023   BUN 19 07/12/2023   CREATININE 1.33 (H) 07/12/2023   CALCIUM  9.5 07/12/2023   GFR 40.94 (L) 07/12/2023   GFRNONAA 39 (L) 01/16/2023   Losartan  25 mg daily  Metoprolol  25 mg daily  Lasix  20 mg bid   Sees nephrology regularlyu   DM2 with nephropathy  Diabetes Home sugar results - CGM - glucose is better since Mediterranean diet    DM diet  Now working with dietician from [employer]  Weight was down 9 lb there Now Mediterranean diet     (only a month)   Had several infections    Exercise -see above   Moujaro 15 mg weekly  Stopped glipizide  this year due to some low readings     Lab Results  Component Value Date   HGBA1C 7.4 (H) 07/12/2023   HGBA1C 7.2 (A) 04/16/2023   HGBA1C 7.3 (A) 12/26/2022   Lab Results  Component Value Date   LABMICR See below: 06/11/2023   LABMICR See below: 05/03/2023   MICROALBUR 3.8 (H) 12/13/2020   MICROALBUR 10.5 (H) 05/08/2019    Renal protection arb Last eye exam 12/2022    Hyperlipidemia Lab Results  Component Value Date   CHOL 129 07/12/2023   CHOL 119 07/04/2022   CHOL 129 01/31/2022   Lab Results  Component Value Date   HDL 53.10 07/12/2023   HDL 52.80 07/04/2022   HDL 57.20 01/31/2022   Lab Results  Component Value Date   LDLCALC 33 07/12/2023   LDLCALC 35 01/31/2022   LDLCALC 40 07/28/2021   Lab Results  Component Value Date   TRIG 215.0 (H) 07/12/2023   TRIG 215.0 (H) 07/04/2022   TRIG 182.0 (H) 01/31/2022   Lab Results  Component Value Date   CHOLHDL 2 07/12/2023   CHOLHDL 2 07/04/2022   CHOLHDL 2 01/31/2022   Lab Results  Component Value Date    LDLDIRECT 50.0 07/04/2022   LDLDIRECT 62.0 11/06/2019   LDLDIRECT 64.0 05/08/2019   Atorvastatin  5 mg every other day Cut out beef Good protein   Takes ppi Lab Results  Component Value Date   VITAMINB12 487 07/12/2023   Has  CT of liver upcoming   Wants to get latest covid shot   Struggling with her rosacea     Patient Active Problem List   Diagnosis Date Noted   DOE (dyspnea on exertion) 07/19/2023   Facial trauma, sequela 04/16/2023   Diabetes mellitus treated with injections of non-insulin  medication (HCC) 12/17/2022   Pedal edema 12/15/2022   Abnormal finding on imaging of liver 10/12/2022   Long-term current use of injectable noninsulin antidiabetic medication 10/12/2022   Panic anxiety syndrome 07/20/2022   Encounter for screening mammogram for breast cancer 07/11/2022   Estrogen deficiency 07/11/2022   Low serum vitamin B12 07/04/2022   Current use of proton pump inhibitor 07/03/2022   Low magnesium  level 06/06/2022   Closed tricolumnar fracture of lumbar vertebra (HCC) 05/12/2022   Compression fracture of L1 lumbar vertebra (HCC) 05/11/2022   Morbid obesity (HCC) 05/11/2022   Fall at home, initial encounter 05/11/2022   Chronic kidney disease, stage 3a (HCC) 05/11/2022   Closed fracture of first lumbar vertebra (HCC) 05/11/2022   Closed fracture of twelfth thoracic vertebra (HCC) 05/11/2022   Spinal instability, lumbar 05/11/2022   Hearing loss, right 04/07/2022   Abnormal SPEP 04/28/2021   Abnormal EKG 04/26/2021   CKD stage 3 due to type 2 diabetes mellitus (HCC) 05/31/2020   Aortic atherosclerosis (HCC) 03/01/2020   Dermatitis 07/01/2018   Left shoulder pain 11/30/2017   Palpitations 07/05/2017   Lichen planus 04/09/2017   Hyperlipidemia associated with type 2 diabetes mellitus (HCC) 04/10/2016   Red blood cell antibody positive 07/02/2013   Elevated C-reactive protein (CRP) 05/15/2012   Routine general medical examination at a health care facility  09/24/2011   Apnea 11/14/2010   Stress reaction, emotional 05/02/2010   Compulsive overeater 05/02/2010   Arthritis    H/O small bowel obstruction    KNEE PAIN 10/14/2009   DM (diabetes mellitus), type 2 with renal complications (HCC) 04/23/2009   Rosacea 09/12/2007   Essential hypertension 05/15/2007   OSA (obstructive sleep apnea) 12/13/2006   Ulcerative colitis (HCC) 07/12/2006   Past Medical History:  Diagnosis Date   Allergy 1970s   Sulfa  drugs   Anemia    Arthritis    generalized   Blood transfusion without reported diagnosis 1979   Bowel obstruction (HCC)    repeated (? possible adhesions) neg EGD   Cataract ~2018   Not ready for surgery yet   Chronic kidney disease 05-2020   Cirrhosis of liver (HCC)    asymptomatic.  Visible on scans.   Colitis    with colectomy   Diabetes mellitus without complication (HCC) ~2011   Dyspnea    covid   Gall stones 01/23/2006   Gastroenteritis 07/25/2014   hosp for IVF    GERD (gastroesophageal reflux disease)    History of kidney stones    HTN (hypertension)    Hyperglycemia    mild-monitors A1C   Ileostomy in place [facility])    Mild mitral regurgitation by prior echocardiogram    Obesity    OSA on CPAP    Pneumonia    Rosacea    Sleep apnea    CPAP everynight   Past Surgical History:  Procedure Laterality Date   APPENDECTOMY  08-1991   Along with colon removal   CATARACT EXTRACTION W/PHACO Left 02/06/2023   Procedure: CATARACT EXTRACTION PHACO AND INTRAOCULAR LENS PLACEMENT (IOC) LEFT DIABETIC 5.90 00:35.9;  Surgeon: Jaye Fallow, MD;  Location: [facility];  Service: Ophthalmology;  Laterality: Left;   CATARACT  EXTRACTION W/PHACO Right 02/20/2023   Procedure: CATARACT EXTRACTION PHACO AND INTRAOCULAR LENS PLACEMENT (IOC) RIGHT DIABETIC 5.28 00:47.0;  Surgeon: Jaye Fallow, MD;  Location[facility];  Service: Ophthalmology;  Laterality: Right;   CHOLECYSTECTOMY  2014   [facility]   COLECTOMY  1993    has ileostomy   COLON SURGERY  2021   removed rectum   COLONOSCOPY  08/1991   COLOSTOMY REVISION  12/06/2011   Procedure: COLOSTOMY REVISION;  Surgeon: Bernarda Ned, MD;  Location[facility];  Service: General;  Laterality: Right;  OSTOMY revision   CYSTOSCOPY/URETEROSCOPY/HOLMIUM LASER/STENT PLACEMENT Left 04/11/2021   Procedure: CYSTOSCOPY/URETEROSCOPY/HOLMIUM LASER/STENT PLACEMENT;  Surgeon: Penne Knee, MD;  Location: [facility];  Service: Urology;  Laterality: Left;   EXAMINATION UNDER ANESTHESIA  12/06/2011   Procedure: EXAM UNDER ANESTHESIA;  Surgeon: Bernarda Ned, MD;  Location: [facility];  Service: General;  Laterality: N/A;  rectal exam under anesthesia, anal dilation, pouchoscopy     SPINE SURGERY  05/12/2022   2 fractured vertebra   SQUAMOUS CELL CARCINOMA EXCISION     TOTAL ABDOMINAL HYSTERECTOMY  05/2006   for abscessed ovaries   Social History   Tobacco Use   Smoking status: Never   Smokeless tobacco: Never  Vaping Use   Vaping status: Never Used  Substance Use Topics   Alcohol use: No   Drug use: Never   Family History  Problem Relation Age of Onset   Hypertension Mother    Hyperlipidemia Mother    Cirrhosis Mother        NASH   Diabetes Mother    Liver cancer Father        resection secondary to mets   Cancer Father        colon   Hyperlipidemia Father    Hypertension Father    Allergies Father    Ulcerative colitis Father    Arthritis Father    Allergies Brother    Gastric cancer Brother    Cancer Brother    Breast cancer Paternal Grandmother    Breast cancer Cousin 43       paternal cousins   ADD / ADHD Son    Allergies  Allergen Reactions   Ciprofloxacin Rash    Erythema and pruritis around IV site, erythema and rash along vein   Other Rash   Jardiance  [Empagliflozin ]     Increase in Cr    Lisinopril     REACTION: felt bad/ stomach hurt/ weak and tired   Metformin  And Related     GI   Sulfa   Antibiotics Rash   Current Outpatient Medications on File Prior to Visit  Medication Sig Dispense Refill   ACCU-CHEK GUIDE test strip TO CHECK GLUCOSE DAILY AND AS NEEDED FOR TYPE 2 DIABETES 100 strip 1   acetaminophen  (TYLENOL ) 325 MG tablet Take 650 mg by mouth every 6 (six) hours as needed for moderate pain.     atorvastatin  (LIPITOR) 10 MG tablet TAKE 1/2 PILL BY MOUTH EVERY OTHER DAY 24 tablet 1   Blood Glucose Monitoring Suppl (ACCU-CHEK GUIDE) w/Device KIT To check glucose daily and as needed for type 2 diabetes 1 kit 0   Cholecalciferol  (VITAMIN D3) 25 MCG (1000 UT) CAPS Take 1,000 Units by mouth daily.     Continuous Glucose Sensor (FREESTYLE LIBRE 3 SENSOR) MISC Use as directed for diabetes type 2 requiring insulin  6 each 3   Cyanocobalamin  (B-12) 1000 MCG TABS Take 1 tablet by mouth daily.  esomeprazole (NEXIUM) 20 MG capsule Take 20 mg by mouth every other day.     estradiol  (ESTRACE ) 0.1 MG/GM vaginal cream Estrogen Cream Instruction Discard applicator Apply pea sized amount to tip of finger to urethra before bed. Wash hands well after application. Use Monday, Wednesday and Friday 42.5 g 12   fluticasone  (FLONASE ) 50 MCG/ACT nasal spray Place 2 sprays into both nostrils at bedtime.     Lancets (ACCU-CHEK MULTICLIX) lancets To check glucose daily and as needed for diabetes type 2 100 each 3   losartan  (COZAAR ) 25 MG tablet Take 25 mg by mouth daily.     Melatonin 1 MG CHEW Chew 1.5 mg by mouth at bedtime as needed.     methenamine  (HIPREX ) 1 g tablet Take 1 tablet (1 g total) by mouth 2 (two) times daily with a meal. 60 tablet 5   metoprolol  tartrate (LOPRESSOR ) 25 MG tablet TAKE 1 TABLET BY MOUTH EVERY DAY 90 tablet 1   Multiple Vitamin (MULTIVITAMIN) tablet Take 1 tablet by mouth daily.     NON FORMULARY SLEEPS WITH BI-PAP     phenazopyridine  (PYRIDIUM ) 200 MG tablet Take 1 tablet (200 mg total) by mouth 3 (three) times daily as needed for pain. 10 tablet 0   simethicone   (MYLICON) 80 MG chewable tablet Chew 1 tablet (80 mg total) by mouth 4 (four) times daily as needed for flatulence. 30 tablet 0   tirzepatide  (MOUNJARO ) 15 MG/0.5ML Pen Inject 15 mg into the skin once a week. 2 mL 3   zolpidem  (AMBIEN ) 10 MG tablet Take 0.5 tablets (5 mg total) by mouth at bedtime as needed for sleep. Caution of sedation and falls 15 tablet 3   No current facility-administered medications on file prior to visit.    Review of Systems  Constitutional:  Positive for fatigue. Negative for activity change, appetite change, fever and unexpected weight change.  HENT:  Negative for congestion, ear pain, rhinorrhea, sinus pressure and sore throat.   Eyes:  Negative for pain, redness and visual disturbance.  Respiratory:  Positive for shortness of breath. Negative for cough, choking, chest tightness, wheezing and stridor.   Cardiovascular:  Negative for chest pain and palpitations.  Gastrointestinal:  Negative for abdominal pain, blood in stool, constipation and diarrhea.       Has had issues with yeast around ostomy  Endocrine: Negative for polydipsia and polyuria.  Genitourinary:  Negative for dysuria, frequency and urgency.  Musculoskeletal:  Negative for arthralgias, back pain and myalgias.  Skin:  Negative for pallor and rash.       Rosacea worse lately  Allergic/Immunologic: Negative for environmental allergies.  Neurological:  Negative for dizziness, syncope and headaches.  Hematological:  Negative for adenopathy. Does not bruise/bleed easily.  Psychiatric/Behavioral:  Negative for decreased concentration and dysphoric mood. The patient is not nervous/anxious.        Objective:   Physical Exam Constitutional:      General: She is not in acute distress.    Appearance: Normal appearance. She is well-developed. She is obese. She is not ill-appearing or diaphoretic.  HENT:     Head: Normocephalic and atraumatic.     Right Ear: Tympanic membrane, ear canal and external ear  normal.     Left Ear: Tympanic membrane, ear canal and external ear normal.     Nose: Nose normal. No congestion.     Mouth/Throat:     Mouth: Mucous membranes are moist.     Pharynx: Oropharynx is clear. No  posterior oropharyngeal erythema.   Eyes:     General: No scleral icterus.    Extraocular Movements: Extraocular movements intact.     Conjunctiva/sclera: Conjunctivae normal.     Pupils: Pupils are equal, round, and reactive to light.   Neck:     Thyroid : No thyromegaly.     Vascular: No carotid bruit or JVD.   Cardiovascular:     Rate and Rhythm: Normal rate and regular rhythm.     Pulses: Normal pulses.     Heart sounds: Normal heart sounds.     No gallop.  Pulmonary:     Effort: Pulmonary effort is normal. No respiratory distress.     Breath sounds: Normal breath sounds. No wheezing.     Comments: Good air exch Chest:     Chest wall: No tenderness.  Abdominal:     General: Bowel sounds are normal. There is no distension or abdominal bruit.     Palpations: Abdomen is soft. There is no mass.     Tenderness: There is no abdominal tenderness.     Hernia: No hernia is present.     Comments: Ostomy in place  No acute skin change today   Genitourinary:    Comments: Breast exam: No mass, nodules, thickening, tenderness, bulging, retraction, inflamation, nipple discharge or skin changes noted.  No axillary or clavicular LA.      Musculoskeletal:        General: No tenderness. Normal range of motion.     Cervical back: Normal range of motion and neck supple. No rigidity. No muscular tenderness.     Right lower leg: No edema.     Left lower leg: No edema.     Comments: No kyphosis   Lymphadenopathy:     Cervical: No cervical adenopathy.   Skin:    General: Skin is warm and dry.     Coloration: Skin is not pale.     Findings: No erythema or rash.     Comments: Some areas of intertrigo under breasts   Fair  Solar lentigines diffusely    Neurological:     Mental  Status: She is alert. Mental status is at baseline.     Cranial Nerves: No cranial nerve deficit.     Motor: No abnormal muscle tone.     Coordination: Coordination normal.     Gait: Gait normal.     Deep Tendon Reflexes: Reflexes are normal and symmetric. Reflexes normal.   Psychiatric:        Mood and Affect: Mood normal.        Cognition and Memory: Cognition and memory normal.           Assessment & Plan:   Problem List Items Addressed This Visit       Cardiovascular and Mediastinum   Essential hypertension   bp in fair control at this time  BP Readings from Last 1 Encounters:  07/19/23 118/68   No changes needed Most recent labs reviewed  Disc lifstyle change with low sodium diet and exercise  Losartan  25 mg daily  Metoprolol  25 mg daily  Lasix  20 mg bid   Lab today  Continues nephrology care Working on weight loss         Respiratory   OSA (obstructive sleep apnea)   Uses bipap  Sees pulmonary  Will mention DOE at next visit         Digestive   Ulcerative colitis (HCC)   Colectomy with ostomy  Some yeast  issues with site after last antibiotic   Need follow up with ostomy care if not improving        Endocrine   Hyperlipidemia associated with type 2 diabetes mellitus (HCC)   Disc goals for lipids and reasons to control them Rev last labs with pt Rev low sat fat diet in detail Continue atorvastatin  5 mg every other day       Diabetes mellitus treated with injections of non-insulin  medication (HCC)   Lab Results  Component Value Date   HGBA1C 7.4 (H) 07/12/2023   HGBA1C 7.2 (A) 04/16/2023   HGBA1C 7.3 (A) 12/26/2022   Moujaro 15 mg weekly  Has started Mediterranean diet in past month/working with dietician and per pt loset 9 lb  Hope next A1c will be improved  No longer on glipizide  due to low readings   Microalb done by nephrology   Taking arb and statin       CKD stage 3 due to type 2 diabetes mellitus (HCC)   GFR of 40.9-  improved from last check here Continues nephrology follow up and blood pressure control  Dm is not optimally controlled         Other   Routine general medical examination at a health care facility - Primary   Reviewed health habits including diet and exercise and skin cancer prevention Reviewed appropriate screening tests for age  Also reviewed health mt list, fam hx and immunization status , as well as social and family history   See HPI Labs reviewed and ordered Health Maintenance  Topic Date Due   Hepatitis B Vaccine (1 of 3 - Risk 3-dose series) Never done   Eye exam for diabetics  12/24/2022   COVID-19 Vaccine (8 - Pfizer risk 2024-25 season) 12/31/2023*   Yearly kidney health urinalysis for diabetes  02/07/2027*   Flu Shot  08/24/2023   Mammogram  09/06/2023   Medicare Annual Wellness Visit  10/05/2023   Hemoglobin A1C  01/11/2024   Complete foot exam   04/15/2024   Yearly kidney function blood test for diabetes  07/11/2024   DTaP/Tdap/Td vaccine (4 - Td or Tdap) 11/22/2032   Pneumococcal Vaccine for age over 41  Completed   DEXA scan (bone density measurement)  Completed   Hepatitis C Screening  Completed   Zoster (Shingles) Vaccine  Completed   HPV Vaccine  Aged Out   Meningitis B Vaccine  Aged Out   Colon Cancer Screening  Discontinued  *Topic was postponed. The date shown is not the original due date.    Mammogram ordered  Discussed fall prevention, supplements and exercise for bone density  PHQ 0 Working on weight loss with Mediterranean diet       Rosacea   Refilled metrol gel 1% which helped in past Pt does avoid heat and sun      Morbid obesity (HCC)   Discussed how this problem influences overall health and the risks it imposes  Reviewed plan for weight loss with lower calorie diet (via better food choices (lower glycemic and portion control) along with exercise building up to or more than 30 minutes 5 days per week including some aerobic activity and  strength training   Working with dietician Taking moujaro   Encouraged strength building exercise if able       Low serum vitamin B12   Lab Results  Component Value Date   VITAMINB12 487 07/12/2023   Continues supplementation       Encounter for screening mammogram  for breast cancer   Mammogram ordered  Due in August       Relevant Orders   MM 3D SCREENING MAMMOGRAM BILATERAL BREAST   DOE (dyspnea on exertion)   Moreso recently  Utd cardiology care Normal echo  No change in exam   Plans to mention to her pulmonologist   This could be related to obesity and deconditioning   Call back and Er precautions noted in detail today        Current use of proton pump inhibitor   Lab Results  Component Value Date   VITAMINB12 487 07/12/2023   Continue to watch vitamin levels        Compulsive overeater   Taking moujaro for help with dm and appetite

## 2023-07-19 NOTE — Assessment & Plan Note (Signed)
Mammogram ordered.  Due in August.

## 2023-07-19 NOTE — Assessment & Plan Note (Signed)
 Reviewed health habits including diet and exercise and skin cancer prevention Reviewed appropriate screening tests for age  Also reviewed health mt list, fam hx and immunization status , as well as social and family history   See HPI Labs reviewed and ordered Health Maintenance  Topic Date Due   Hepatitis B Vaccine (1 of 3 - Risk 3-dose series) Never done   Eye exam for diabetics  12/24/2022   COVID-19 Vaccine (8 - Pfizer risk 2024-25 season) 12/31/2023*   Yearly kidney health urinalysis for diabetes  02/07/2027*   Flu Shot  08/24/2023   Mammogram  09/06/2023   Medicare Annual Wellness Visit  10/05/2023   Hemoglobin A1C  01/11/2024   Complete foot exam   04/15/2024   Yearly kidney function blood test for diabetes  07/11/2024   DTaP/Tdap/Td vaccine (4 - Td or Tdap) 11/22/2032   Pneumococcal Vaccine for age over 39  Completed   DEXA scan (bone density measurement)  Completed   Hepatitis C Screening  Completed   Zoster (Shingles) Vaccine  Completed   HPV Vaccine  Aged Out   Meningitis B Vaccine  Aged Out   Colon Cancer Screening  Discontinued  *Topic was postponed. The date shown is not the original due date.    Mammogram ordered  Discussed fall prevention, supplements and exercise for bone density  PHQ 0 Working on weight loss with Mediterranean diet

## 2023-07-19 NOTE — Assessment & Plan Note (Signed)
 Refilled metrol gel 1% which helped in past Pt does avoid heat and sun

## 2023-07-19 NOTE — Assessment & Plan Note (Signed)
 Disc goals for lipids and reasons to control them Rev last labs with pt Rev low sat fat diet in detail Continue atorvastatin  5 mg every other day

## 2023-07-19 NOTE — Assessment & Plan Note (Signed)
 Lab Results  Component Value Date   HGBA1C 7.4 (H) 07/12/2023   HGBA1C 7.2 (A) 04/16/2023   HGBA1C 7.3 (A) 12/26/2022   Moujaro 15 mg weekly  Has started Mediterranean diet in past month/working with dietician and per pt loset 9 lb  Hope next A1c will be improved  No longer on glipizide  due to low readings   Microalb done by nephrology   Taking arb and statin

## 2023-07-19 NOTE — Assessment & Plan Note (Signed)
 Uses bipap  Sees pulmonary  Will mention DOE at next visit

## 2023-07-19 NOTE — Assessment & Plan Note (Signed)
 bp in fair control at this time  BP Readings from Last 1 Encounters:  07/19/23 118/68   No changes needed Most recent labs reviewed  Disc lifstyle change with low sodium diet and exercise  Losartan  25 mg daily  Metoprolol  25 mg daily  Lasix  20 mg bid   Lab today  Continues nephrology care Working on weight loss

## 2023-07-19 NOTE — Assessment & Plan Note (Signed)
 Lab Results  Component Value Date   VITAMINB12 487 07/12/2023   Continue to watch vitamin levels

## 2023-07-19 NOTE — Assessment & Plan Note (Signed)
 Taking moujaro for help with dm and appetite

## 2023-07-19 NOTE — Assessment & Plan Note (Signed)
 Colectomy with ostomy  Some yeast issues with site after last antibiotic   Need follow up with ostomy care if not improving

## 2023-07-19 NOTE — Assessment & Plan Note (Signed)
 Moreso recently  Utd cardiology care Normal echo  No change in exam   Plans to mention to her pulmonologist   This could be related to obesity and deconditioning   Call back and Er precautions noted in detail today

## 2023-07-19 NOTE — Assessment & Plan Note (Signed)
 Lab Results  Component Value Date   VITAMINB12 487 07/12/2023   Continues supplementation

## 2023-08-16 ENCOUNTER — Other Ambulatory Visit: Payer: Self-pay | Admitting: Family Medicine

## 2023-08-21 ENCOUNTER — Encounter: Payer: Self-pay | Admitting: Family Medicine

## 2023-08-21 MED ORDER — FREESTYLE LIBRE 3 PLUS SENSOR MISC: 3 refills | Status: AC

## 2023-08-30 DIAGNOSIS — K7460 Unspecified cirrhosis of liver: Secondary | ICD-10-CM | POA: Diagnosis not present

## 2023-08-30 DIAGNOSIS — K7400 Hepatic fibrosis, unspecified: Secondary | ICD-10-CM | POA: Diagnosis not present

## 2023-08-30 DIAGNOSIS — K760 Fatty (change of) liver, not elsewhere classified: Secondary | ICD-10-CM | POA: Diagnosis not present

## 2023-08-30 DIAGNOSIS — G4733 Obstructive sleep apnea (adult) (pediatric): Secondary | ICD-10-CM | POA: Diagnosis not present

## 2023-08-30 DIAGNOSIS — K7689 Other specified diseases of liver: Secondary | ICD-10-CM | POA: Diagnosis not present

## 2023-09-01 ENCOUNTER — Other Ambulatory Visit: Payer: Self-pay | Admitting: Family Medicine

## 2023-09-06 ENCOUNTER — Ambulatory Visit: Admitting: Internal Medicine

## 2023-09-07 ENCOUNTER — Ambulatory Visit
Admission: RE | Admit: 2023-09-07 | Discharge: 2023-09-07 | Disposition: A | Discharge status: Home / Self Care | Source: Ambulatory Visit | Attending: Family Medicine | Admitting: Family Medicine

## 2023-09-07 DIAGNOSIS — Z1231 Encounter for screening mammogram for malignant neoplasm of breast: Secondary | ICD-10-CM | POA: Diagnosis not present

## 2023-09-11 ENCOUNTER — Ambulatory Visit: Payer: Self-pay | Admitting: Family Medicine

## 2023-09-12 DIAGNOSIS — I10 Essential (primary) hypertension: Secondary | ICD-10-CM | POA: Diagnosis not present

## 2023-09-12 DIAGNOSIS — E1122 Type 2 diabetes mellitus with diabetic chronic kidney disease: Secondary | ICD-10-CM | POA: Diagnosis not present

## 2023-09-12 DIAGNOSIS — R609 Edema, unspecified: Secondary | ICD-10-CM | POA: Diagnosis not present

## 2023-09-12 DIAGNOSIS — N1831 Chronic kidney disease, stage 3a: Secondary | ICD-10-CM | POA: Diagnosis not present

## 2023-09-13 DIAGNOSIS — Z961 Presence of intraocular lens: Secondary | ICD-10-CM | POA: Diagnosis not present

## 2023-09-13 DIAGNOSIS — E119 Type 2 diabetes mellitus without complications: Secondary | ICD-10-CM | POA: Diagnosis not present

## 2023-09-13 DIAGNOSIS — H43813 Vitreous degeneration, bilateral: Secondary | ICD-10-CM | POA: Diagnosis not present

## 2023-09-13 LAB — HM DIABETES EYE EXAM

## 2023-09-17 DIAGNOSIS — R609 Edema, unspecified: Secondary | ICD-10-CM | POA: Diagnosis not present

## 2023-09-17 DIAGNOSIS — N1831 Chronic kidney disease, stage 3a: Secondary | ICD-10-CM | POA: Diagnosis not present

## 2023-09-17 DIAGNOSIS — E1122 Type 2 diabetes mellitus with diabetic chronic kidney disease: Secondary | ICD-10-CM | POA: Diagnosis not present

## 2023-09-17 DIAGNOSIS — I10 Essential (primary) hypertension: Secondary | ICD-10-CM | POA: Diagnosis not present

## 2023-10-05 ENCOUNTER — Ambulatory Visit: Admitting: Internal Medicine

## 2023-10-05 ENCOUNTER — Encounter: Payer: Self-pay | Admitting: Internal Medicine

## 2023-10-05 VITALS — BP 110/80 | HR 85 | Ht 66.0 in | Wt 267.8 lb

## 2023-10-05 DIAGNOSIS — G4733 Obstructive sleep apnea (adult) (pediatric): Secondary | ICD-10-CM

## 2023-10-05 DIAGNOSIS — Z6841 Body Mass Index (BMI) 40.0 and over, adult: Secondary | ICD-10-CM

## 2023-10-05 DIAGNOSIS — E669 Obesity, unspecified: Secondary | ICD-10-CM | POA: Diagnosis not present

## 2023-10-05 NOTE — Patient Instructions (Addendum)
 Try F30i fullface mask AirFit medium size  Risks and benefits explained to patient in detail regarding compliance with BiPAP  Avoid Allergens and Irritants Avoid secondhand smoke Avoid SICK contacts Recommend  Masking  when appropriate Recommend Keep up-to-date with vaccinations

## 2023-10-05 NOTE — Progress Notes (Signed)
 [facility]  Pulmonary Medicine Consultation     Synopsis Establish care for sleep apnea several years ago  **CPAP download data 11/28/2017-12/28/2018>> raw data personally reviewed, she greater than 4 hours is 19/30 days (63%).  Average usage on days used for 33 minutes.  Pressure is 19/10.  Residual AHI is 5.9 leaks are minimal. **Review of 30 days of download data as of 12/21/16; usage is 30/30 days.  Average usage on days used is 5 hours 51 minutes.  Current settings are her curve 10 V auto; BiPAP 19/10 residual AHI is 3.2.  -home sleep study from the December 20th 2012:AHI 37.  CPAP download January 2023 87 (compliance for days 57 compliance for greater than 4 hours AHI reduced to 2.5 BiPAP IPAP 19 EPAP 10   CC Follow-up assessment for OSA   HPI:   download reviewed in detail Currently on BiPAP IPAP 19 EPAP 10 Compliance data reviewed in detail Has been prescribed fullface mask Previous AHI was 37  Last office visit patient had facial trauma and unable to wear her BiPAP Unable to wear BiPAP due to facial sensitivity Patient changed to a new mask with memory foam that seems to be working better F20 fullface mask  Patient is asking for the F 30I AirFit masks to try  No exacerbation at this time No evidence of heart failure at this time No evidence or signs of infection at this time No respiratory distress No fevers, chills, nausea, vomiting, diarrhea No evidence of lower extremity edema No evidence hemoptysis  I have explained the importance of compliance as she has severe sleep apnea with AHI of 37  Patient does have ileostomy that she takes care of at night Patient with a new diagnosis of liver cirrhosis being evaluated at [facility] will be enrolled in the trial  BP 110/80   Pulse 85   Ht 5' 6 (1.676 m)   Wt 267 lb 12.8 oz (121.5 kg)   SpO2 98%   BMI 43.22 kg/m     Physical Examination:   General Appearance: No distress  EYES PERRLA, EOM intact.   NECK  Supple, No JVD Pulmonary: normal breath sounds, No wheezing.  CardiovascularNormal S1,S2.  No m/r/g.   Abdomen: Benign, Soft, non-tender. Neurology UE/LE 5/5 strength, no focal deficits Ext pulses intact, cap refill intact ALL OTHER ROS ARE NEGATIVE     Assessment and Plan:  68 year old pleasant white female seen today for assessment and diagnosis of SEVERE OSA   Assessment of OSA Try F30i fullface mask AirFit medium size Risks and benefits explained to patient in detail regarding compliance with BiPAP  No evidence of acute heart failure at this time No respiratory distress No fevers, chills, nausea, vomiting, diarrhea No evidence hemoptysis  Patient Instructions Continue to use CPAP every night, minimum of 4-6 hours a night.  Change equipment every 30 days or as directed by DME.  Wash your tubing with warm soap and water daily, hang to dry. Wash humidifier portion weekly. Use bottled, distilled water and change daily   Be aware of reduced alertness and do not drive or operate heavy machinery if experiencing this or drowsiness.  Exercise encouraged, as tolerated. Encouraged proper weight management.  Important to get eight or more hours of sleep  Limiting the use of the computer and television before bedtime.  Decrease naps during the day, so night time sleep will become enhanced.  Limit caffeine, and sleep deprivation.  HTN, stroke, uncontrolled diabetes and heart failure are potential risk factors.  Risk of untreated sleep apnea including cardiac arrhthymias, stroke, DM, pulm HTN.    Obesity -recommend significant weight loss -recommend changing diet  Deconditioned state -Recommend increased daily activity and exercise     Follow up 3 months   I spent a total of 41 minutes dedicated to the care of this patient on the date of this encounter to include pre-visit review of records, face-to-face time with the patient discussing conditions above, post visit ordering  of testing, clinical documentation with the electronic health record, making appropriate referrals as documented, and communicating necessary information to the patient's healthcare team.    The Patient requires high complexity decision making for assessment and support, frequent evaluation and titration of therapies, application of advanced monitoring technologies and extensive interpretation of multiple databases.  Patient satisfied with Plan of action and management. All questions answered    Nickolas Alm Cellar, M.D.  [facility]  Medical Director [facility] Medical Director [facility] Cardio-Pulmonary Department

## 2023-10-06 IMAGING — CT CT ABD-PELV W/ CM
2 of 5 series · 15 of 46 positions shown, 17 images · IV contrast (APPLIED)
Comparison: February 23, 2021.

CLINICAL DATA: Status post colectomy with ileostomy.  Diarrhea.

EXAM:
CT ABDOMEN AND PELVIS WITH CONTRAST
TECHNIQUE: Multidetector CT imaging of the abdomen and pelvis was performed
using the standard protocol following bolus administration of
intravenous contrast.

[Series 2: abdomen 5.0 · axial · 0.91mm/px · z∈[+878,+1294]mm · 12 of 99 slices shown, 14 images]
[im 8/99  soft-tissue]
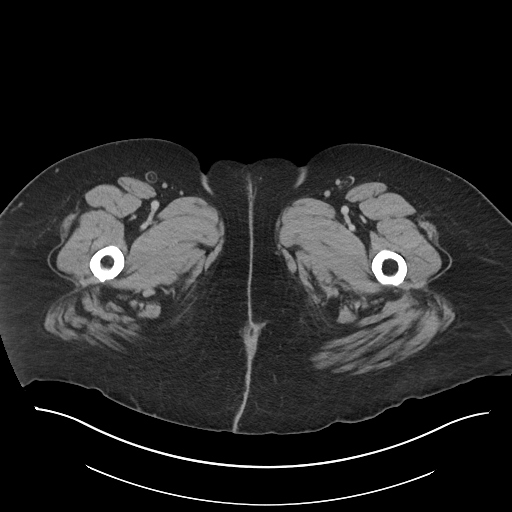
[im 8/99  bone]
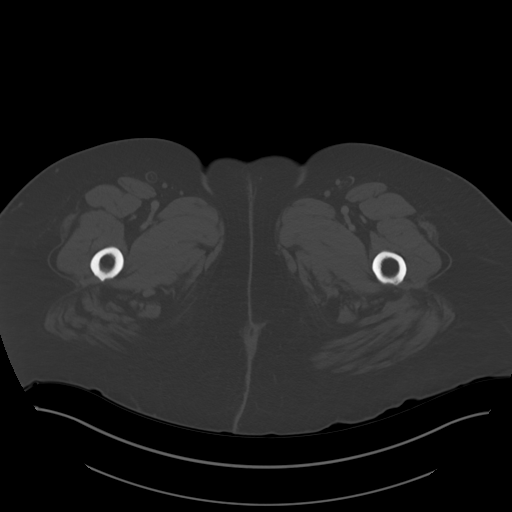
[im 16/99  soft-tissue]
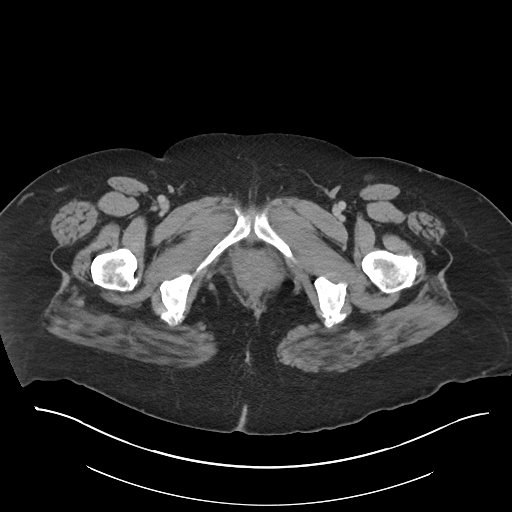
[im 23/99  soft-tissue]
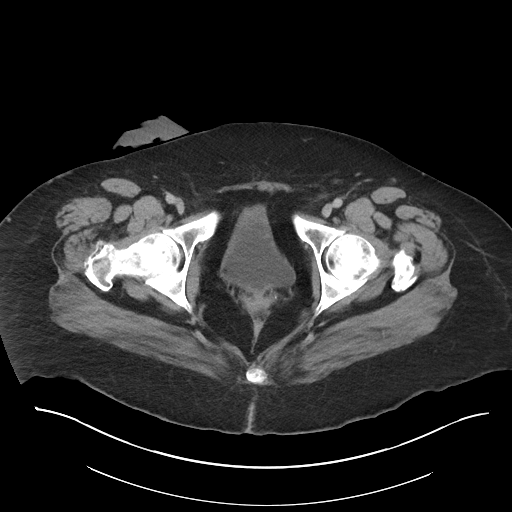
[im 31/99  soft-tissue]
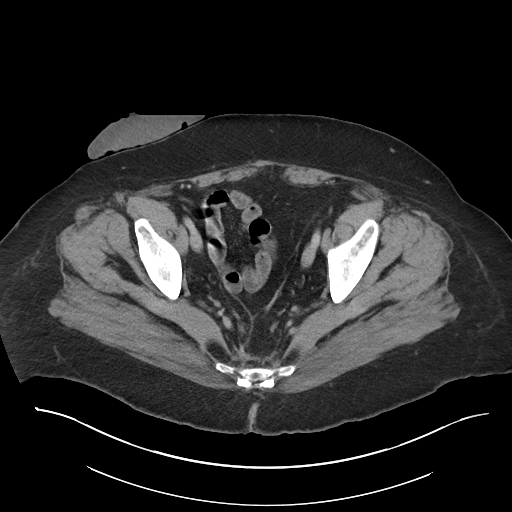
[im 38/99  soft-tissue]
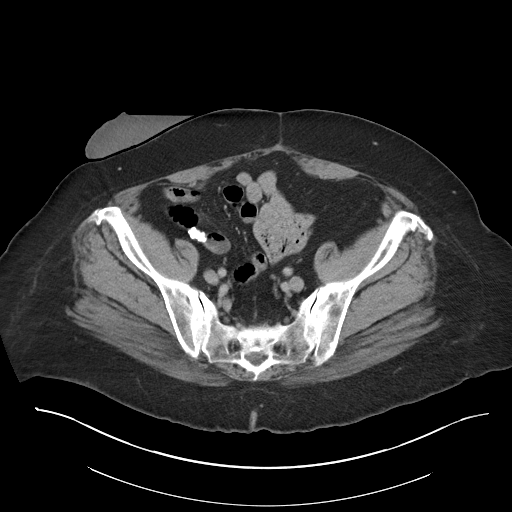
[im 46/99  soft-tissue]
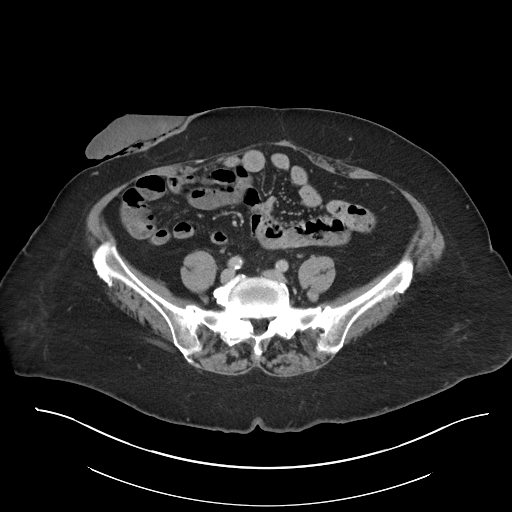
[im 53/99  soft-tissue]
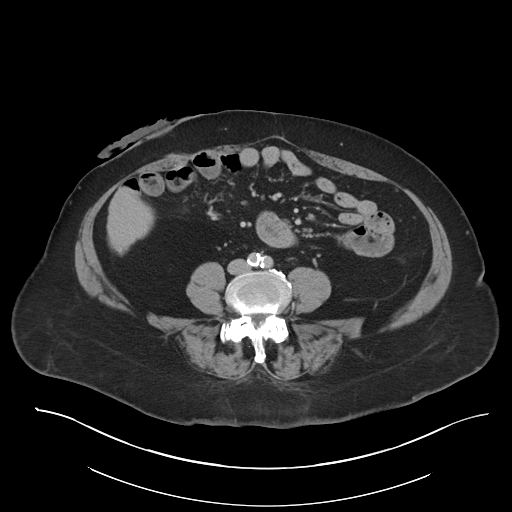
[im 61/99  soft-tissue]
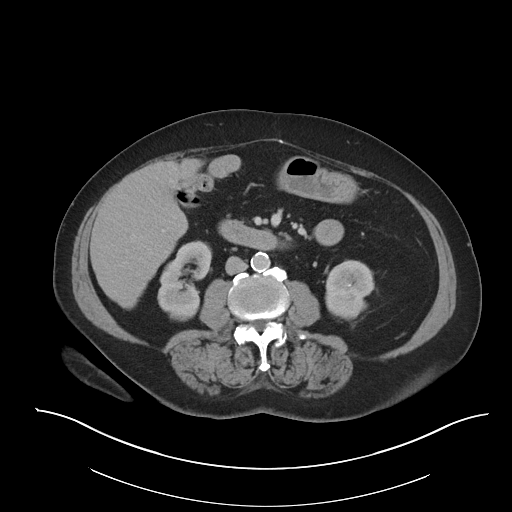
[im 68/99  soft-tissue]
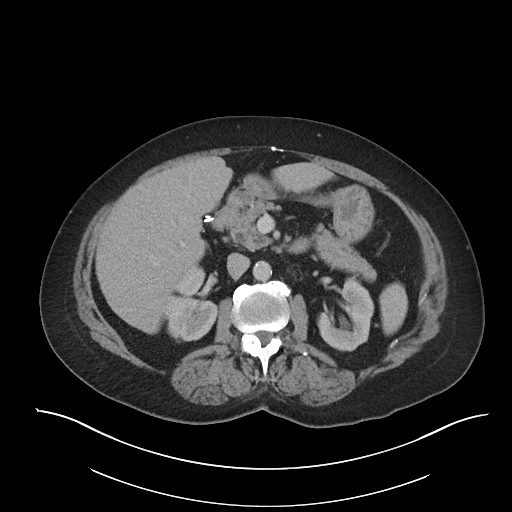
[im 68/99  bone]
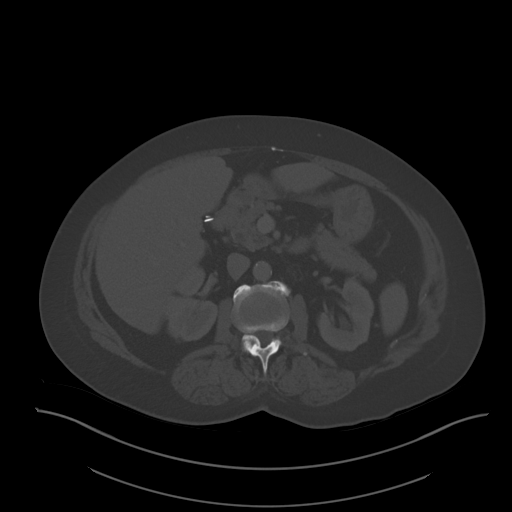
[im 76/99  soft-tissue]
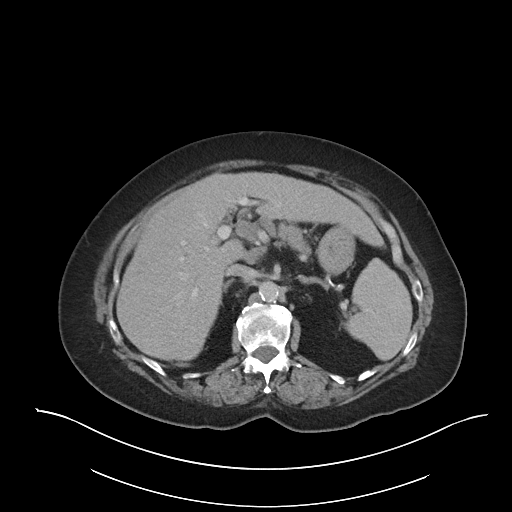
[im 83/99  soft-tissue]
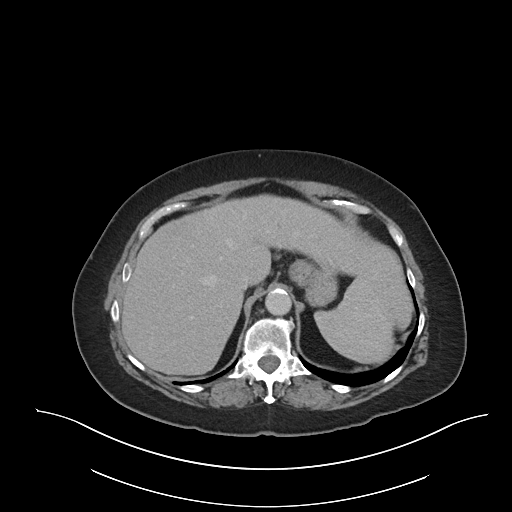
[im 91/99  soft-tissue]
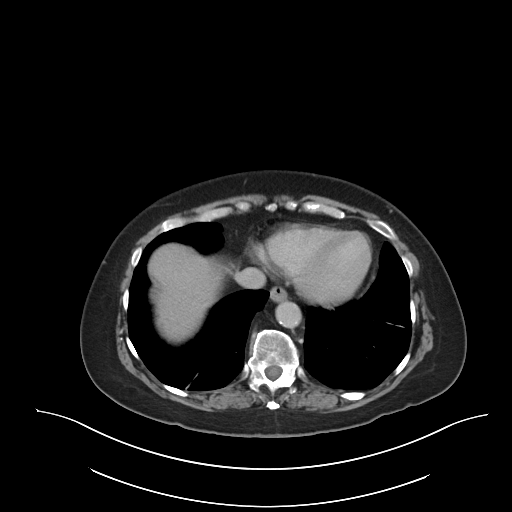

[Series 5: abdomen 3.0 mpr cor · coronal · 0.93mm/px · 3 of 105 slices shown]
[im 35/105  soft-tissue]
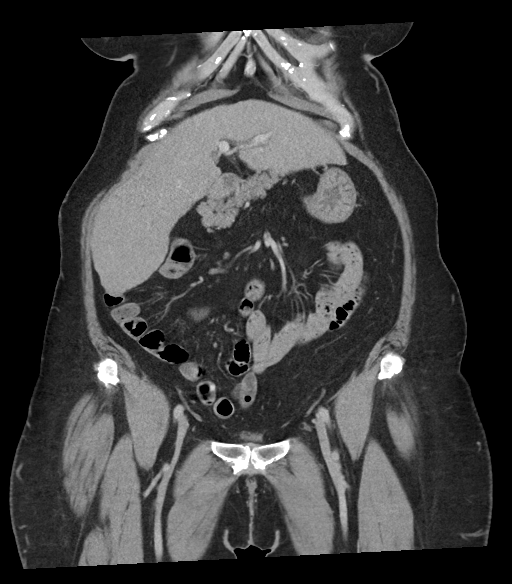
[im 47/105  soft-tissue]
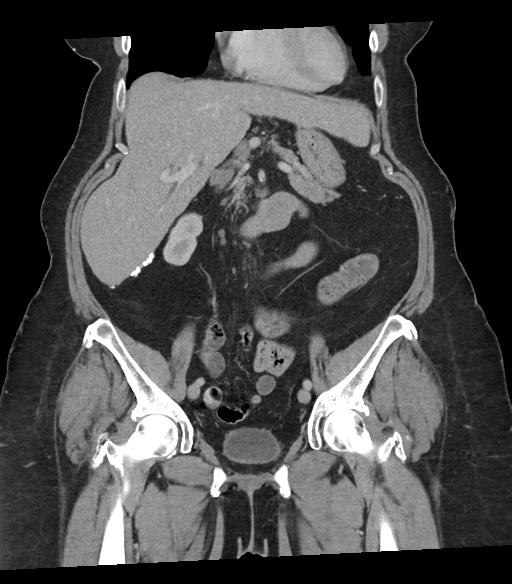
[im 58/105  soft-tissue]
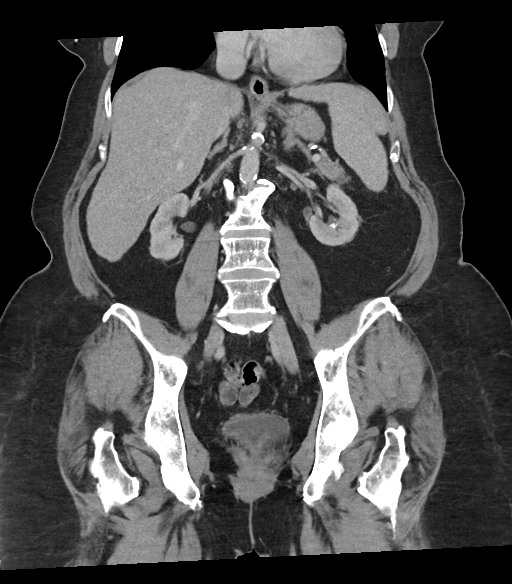

[15 of 46 positions shown; findings below may reference images not displayed]

RADIATION DOSE REDUCTION: This exam was performed according to the
departmental dose-optimization program which includes automated
exposure control, adjustment of the mA and/or kV according to
patient size and/or use of iterative reconstruction technique.

CONTRAST:  80mL OMNIPAQUE IOHEXOL 300 MG/ML  SOLN
FINDINGS: Lower chest: No acute abnormality.

Hepatobiliary: Status post cholecystectomy. No biliary dilatation is
noted. Nodular hepatic contours are noted consistent with hepatic
cirrhosis.

Pancreas: Unremarkable. No pancreatic ductal dilatation or
surrounding inflammatory changes.

Spleen: Normal in size without focal abnormality.

Adrenals/Urinary Tract: Adrenal glands appear normal. Nonobstructive
left nephrolithiasis is noted. No hydronephrosis or renal
obstruction is noted. Urinary bladder is unremarkable.

Stomach/Bowel: The stomach is unremarkable. Status post colectomy
with ileostomy in the right lower quadrant. No abnormal bowel
dilatation is noted.

Vascular/Lymphatic: Aortic atherosclerosis. No enlarged abdominal or
pelvic lymph nodes.

Reproductive: Status post hysterectomy. No adnexal masses.

Other: No definite hernia or ascites is noted. There are again noted
stable calcifications along the medial and inferior aspect of the
right hepatic lobe which may represent dropped gallstones.

Musculoskeletal: No acute or significant osseous findings.
IMPRESSION: Nodular hepatic contours are noted suggesting hepatic cirrhosis.

Status post colectomy with ileostomy in right lower quadrant. No
abnormal bowel dilatation is noted.

Nonobstructive left nephrolithiasis.

Aortic Atherosclerosis ([id number]-KPA.A).

## 2023-10-09 ENCOUNTER — Encounter

## 2023-10-13 LAB — LAB REPORT - SCANNED
A1c: 7.5
EGFR: 52

## 2023-10-16 LAB — LAB REPORT - SCANNED
A1c: 7.5
EGFR: 52
HM Hepatitis Screen: NEGATIVE

## 2023-10-18 NOTE — Progress Notes (Signed)
 Jamie [name]                                          MRN: [id number]   10/18/2023   The [facility] Quality Team Specialist reviewed this patient medical record for the purposes of chart review for care gap closure. The following were reviewed: chart review for care gap closure-kidney health evaluation for diabetes:eGFR  and uACR. No uACR found.     [facility] Quality Team

## 2023-10-22 ENCOUNTER — Telehealth: Payer: Self-pay

## 2023-10-22 DIAGNOSIS — G4733 Obstructive sleep apnea (adult) (pediatric): Secondary | ICD-10-CM

## 2023-10-22 NOTE — Telephone Encounter (Signed)
 Copied from CRM [id number]. Topic: Clinical - Order For Equipment >> Oct 22, 2023  4:16 PM Isabell A wrote: Reason for CRM: Patient is requesting an order to [employer] stating the new mask F30i Medium is a medical necessity so she can receive replacements.    Callback number: [phone]

## 2023-10-25 ENCOUNTER — Encounter: Payer: Self-pay | Admitting: Family Medicine

## 2023-10-30 DIAGNOSIS — G4733 Obstructive sleep apnea (adult) (pediatric): Secondary | ICD-10-CM | POA: Diagnosis not present

## 2023-10-30 DIAGNOSIS — E119 Type 2 diabetes mellitus without complications: Secondary | ICD-10-CM | POA: Diagnosis not present

## 2023-10-30 DIAGNOSIS — K5190 Ulcerative colitis, unspecified, without complications: Secondary | ICD-10-CM | POA: Diagnosis not present

## 2023-10-30 DIAGNOSIS — R932 Abnormal findings on diagnostic imaging of liver and biliary tract: Secondary | ICD-10-CM | POA: Diagnosis not present

## 2023-10-30 DIAGNOSIS — Z932 Ileostomy status: Secondary | ICD-10-CM | POA: Diagnosis not present

## 2023-10-31 ENCOUNTER — Ambulatory Visit: Admitting: Family Medicine

## 2023-10-31 ENCOUNTER — Encounter: Payer: Self-pay | Admitting: Family Medicine

## 2023-10-31 VITALS — BP 126/74 | HR 79 | Temp 97.8°F | Ht 64.0 in | Wt 265.2 lb

## 2023-10-31 DIAGNOSIS — E1169 Type 2 diabetes mellitus with other specified complication: Secondary | ICD-10-CM

## 2023-10-31 DIAGNOSIS — K7581 Nonalcoholic steatohepatitis (NASH): Secondary | ICD-10-CM | POA: Diagnosis not present

## 2023-10-31 DIAGNOSIS — E1122 Type 2 diabetes mellitus with diabetic chronic kidney disease: Secondary | ICD-10-CM

## 2023-10-31 DIAGNOSIS — Z7985 Long-term (current) use of injectable non-insulin antidiabetic drugs: Secondary | ICD-10-CM

## 2023-10-31 DIAGNOSIS — E119 Type 2 diabetes mellitus without complications: Secondary | ICD-10-CM | POA: Diagnosis not present

## 2023-10-31 DIAGNOSIS — K7469 Other cirrhosis of liver: Secondary | ICD-10-CM | POA: Insufficient documentation

## 2023-10-31 DIAGNOSIS — N1832 Chronic kidney disease, stage 3b: Secondary | ICD-10-CM

## 2023-10-31 DIAGNOSIS — N183 Chronic kidney disease, stage 3 unspecified: Secondary | ICD-10-CM | POA: Diagnosis not present

## 2023-10-31 DIAGNOSIS — E785 Hyperlipidemia, unspecified: Secondary | ICD-10-CM

## 2023-10-31 DIAGNOSIS — I10 Essential (primary) hypertension: Secondary | ICD-10-CM | POA: Diagnosis not present

## 2023-10-31 LAB — POCT GLYCOSYLATED HEMOGLOBIN (HGB A1C): Hemoglobin A1C: 7.2 % — AB (ref 4.0–5.6)

## 2023-10-31 NOTE — Assessment & Plan Note (Signed)
 Discussed how this problem influences overall health and the risks it imposes  Reviewed plan for weight loss with lower calorie diet (via better food choices (lower glycemic and portion control) along with exercise building up to or more than 30 minutes 5 days per week including some aerobic activity and strength training   On mounjaro   Continues to work on diet/exercise  With nutritionist at [facility]  Has cirrhosis from fatty liver

## 2023-10-31 NOTE — Patient Instructions (Addendum)
 Keep up the exercise !  Work up to 30 or more minutes five days per week - gradually  The recumbent bike at the gym is a good choice   At home Add some strength training to your routine, this is important for bone and brain health and can reduce your risk of falls and help your body use insulin  properly and regulate weight  Light weights, exercise bands , and internet videos are a good way to start  Yoga (chair or regular), machines , floor exercises or a gym with machines are also good options    Try to cut back sweets as much as you  Follow up in 3 months

## 2023-10-31 NOTE — Assessment & Plan Note (Signed)
 bp in fair control at this time  BP Readings from Last 1 Encounters:  10/31/23 126/74   No changes needed Most recent labs reviewed  Disc lifstyle change with low sodium diet and exercise  Losartan  25 mg daily  Metoprolol  25 mg daily  Lasix  20 mg bid    Continues nephrology care for ckd Working on weight loss

## 2023-10-31 NOTE — Progress Notes (Signed)
 Subjective:    Patient ID: Jamie [name], female    DOB: 1955/12/17, 68 y.o.   MRN: [id number]  HPI  Wt Readings from Last 3 Encounters:  10/31/23 265 lb 4 oz (120.3 kg)  10/05/23 267 lb 12.8 oz (121.5 kg)  07/19/23 262 lb 8 oz (119.1 kg)   45.53 kg/m  Vitals:   10/31/23 0959  BP: 126/74  Pulse: 79  Temp: 97.8 F (36.6 C)  SpO2: 98%     Pt presents for follow up of chronic medical problems HTN DM2 Hyperlipidemia   HTN bp is stable today  No cp or palpitations or headaches or edema  No side effects to medicines  BP Readings from Last 3 Encounters:  10/31/23 126/74  10/05/23 110/80  07/19/23 118/68    Losartan  25 mg daily  Metoprolol  25 mg daily  Lasix  20 mg bid    CKD 3  Nephrology appointment was in aug  Lab Results  Component Value Date   NA 136 07/12/2023   K 4.3 07/12/2023   CO2 21 07/12/2023   GLUCOSE 190 (H) 07/12/2023   BUN 19 07/12/2023   CREATININE 1.33 (H) 07/12/2023   CALCIUM  9.5 07/12/2023   GFR 40.94 (L) 07/12/2023   GFRNONAA 39 (L) 01/16/2023   DM2 Diabetes Home sugar results   DM diet -did the Mediterranean diet  Tricky because she does not eat some of those veggies  In general eating well  If pasta -uses whole wheat  If bread- 1 pc instead of 2  Sweet potato / carrots for veggies   Avoids sweets the best she can  About 2-3 times per week   Portions are pretty small   Exercise -twice weekly at [employer] - both cardio and strength and then senior yoga  Does not have much stamina   Breathing is the problem-has addressed with pulmonologist  Echo was ok in January 2025     Mounaro 15 mg weekly  No metformin  due to ckd  Glipizide  caused low glucose readings   7.2 today   Lab Results  Component Value Date   HGBA1C 7.2 (A) 10/31/2023   HGBA1C 7.4 (H) 07/12/2023   HGBA1C 7.2 (A) 04/16/2023   Lab Results  Component Value Date   LABMICR See below: 06/11/2023   LABMICR See below: 05/03/2023    MICROALBUR 1.0 07/13/2009  Proteinuria-sees nephrology  Jardiance  tried- gave her UTIs    Renal protection-arb  Last eye exam  - 08/2023   Hyperlipidemia Lab Results  Component Value Date   CHOL 129 07/12/2023   HDL 53.10 07/12/2023   LDLCALC 33 07/12/2023   LDLDIRECT 50.0 07/04/2022   TRIG 215.0 (H) 07/12/2023   CHOLHDL 2 07/12/2023  Ground [place] instead of beef  Some chicken  Atorvastatin  5 mg every other day   History of cirrhosis due to fatty liver Compensated cirrhosis  Lab Results  Component Value Date   ALT 23 07/12/2023   AST 28 07/12/2023   ALKPHOS 86 07/12/2023   BILITOT 0.9 07/12/2023    In a screening process at [facility] for a double blinded drug trial  Will have a liver bx Had labs and fibro scan  This will require an eating plan in the future   Efruxifermin for NASH    Patient Active Problem List   Diagnosis Date Noted   Liver cirrhosis secondary to NASH (HCC) 10/31/2023   DOE (dyspnea on exertion) 07/19/2023   Facial trauma, sequela 04/16/2023   Diabetes  mellitus treated with injections of non-insulin  medication (HCC) 12/17/2022   Pedal edema 12/15/2022   Abnormal finding on imaging of liver 10/12/2022   Long-term current use of injectable noninsulin antidiabetic medication 10/12/2022   Panic anxiety syndrome 07/20/2022   Encounter for screening mammogram for breast cancer 07/11/2022   Estrogen deficiency 07/11/2022   Low serum vitamin B12 07/04/2022   Current use of proton pump inhibitor 07/03/2022   Low magnesium  level 06/06/2022   Closed tricolumnar fracture of lumbar vertebra (HCC) 05/12/2022   Compression fracture of L1 lumbar vertebra (HCC) 05/11/2022   Morbid obesity (HCC) 05/11/2022   Fall at home, initial encounter 05/11/2022   Chronic kidney disease, stage 3a (HCC) 05/11/2022   Closed fracture of first lumbar vertebra (HCC) 05/11/2022   Closed fracture of twelfth thoracic vertebra (HCC) 05/11/2022   Spinal instability, lumbar  05/11/2022   Hearing loss, right 04/07/2022   Abnormal SPEP 04/28/2021   Abnormal EKG 04/26/2021   CKD stage 3 due to type 2 diabetes mellitus (HCC) 05/31/2020   Aortic atherosclerosis 03/01/2020   Dermatitis 07/01/2018   Left shoulder pain 11/30/2017   Palpitations 07/05/2017   Lichen planus 04/09/2017   Hyperlipidemia associated with type 2 diabetes mellitus (HCC) 04/10/2016   Red blood cell antibody positive 07/02/2013   Elevated C-reactive protein (CRP) 05/15/2012   Routine general medical examination at a health care facility 09/24/2011   Apnea 11/14/2010   Stress reaction, emotional 05/02/2010   Compulsive overeater 05/02/2010   Arthritis    H/O small bowel obstruction    KNEE PAIN 10/14/2009   DM (diabetes mellitus), type 2 with renal complications (HCC) 04/23/2009   Rosacea 09/12/2007   Essential hypertension 05/15/2007   OSA (obstructive sleep apnea) 12/13/2006   Ulcerative colitis (HCC) 07/12/2006   Past Medical History:  Diagnosis Date   Allergy 1970s   Sulfa  drugs   Anemia    Arthritis    generalized   Blood transfusion without reported diagnosis 1979   Bowel obstruction (HCC)    repeated (? possible adhesions) neg EGD   Cataract ~2018   Not ready for surgery yet   Chronic kidney disease 05-2020   Cirrhosis of liver (HCC)    asymptomatic.  Visible on scans.   Colitis    with colectomy   Diabetes mellitus without complication (HCC) ~2011   Dyspnea    covid   Gall stones 01/23/2006   Gastroenteritis 07/25/2014   hosp for IVF    GERD (gastroesophageal reflux disease)    History of kidney stones    HTN (hypertension)    Hyperglycemia    mild-monitors A1C   Ileostomy in place [facility])    Mild mitral regurgitation by prior echocardiogram    Obesity    OSA on CPAP    Pneumonia    Rosacea    Sleep apnea    CPAP everynight   Past Surgical History:  Procedure Laterality Date   APPENDECTOMY  08-1991   Along with colon removal   CATARACT EXTRACTION  W/PHACO Left 02/06/2023   Procedure: CATARACT EXTRACTION PHACO AND INTRAOCULAR LENS PLACEMENT (IOC) LEFT DIABETIC 5.90 00:35.9;  Surgeon: Jaye Fallow, MD;  Location: [facility];  Service: Ophthalmology;  Laterality: Left;   CATARACT EXTRACTION W/PHACO Right 02/20/2023   Procedure: CATARACT EXTRACTION PHACO AND INTRAOCULAR LENS PLACEMENT (IOC) RIGHT DIABETIC 5.28 00:47.0;  Surgeon: Jaye Fallow, MD;  Location: [facility];  Service: Ophthalmology;  Laterality: Right;   CHOLECYSTECTOMY  2014   [facility]   COLECTOMY  1993   has ileostomy   COLON SURGERY  2021   removed rectum   COLONOSCOPY  08/1991   COLOSTOMY REVISION  12/06/2011   Procedure: COLOSTOMY REVISION;  Surgeon: Bernarda Ned, MD;  Location: [facility];  Service: General;  Laterality: Right;  OSTOMY revision   CYSTOSCOPY/URETEROSCOPY/HOLMIUM LASER/STENT PLACEMENT Left 04/11/2021   Procedure: CYSTOSCOPY/URETEROSCOPY/HOLMIUM LASER/STENT PLACEMENT;  Surgeon: Penne Knee, MD;  Location: [facility];  Service: Urology;  Laterality: Left;   EXAMINATION UNDER ANESTHESIA  12/06/2011   Procedure: EXAM UNDER ANESTHESIA;  Surgeon: Bernarda Ned, MD;  Location: [facility];  Service: General;  Laterality: N/A;  rectal exam under anesthesia, anal dilation, pouchoscopy     SPINE SURGERY  05/12/2022   2 fractured vertebra   SQUAMOUS CELL CARCINOMA EXCISION     TOTAL ABDOMINAL HYSTERECTOMY  05/2006   for abscessed ovaries   Social History   Tobacco Use   Smoking status: Never   Smokeless tobacco: Never  Vaping Use   Vaping status: Never Used  Substance Use Topics   Alcohol use: No   Drug use: Never   Family History  Problem Relation Age of Onset   Hypertension Mother    Hyperlipidemia Mother    Cirrhosis Mother        NASH   Diabetes Mother    Liver cancer Father        resection secondary to mets   Cancer Father        colon   Hyperlipidemia Father    Hypertension Father     Allergies Father    Ulcerative colitis Father    Arthritis Father    Allergies Brother    Gastric cancer Brother    Cancer Brother    Breast cancer Paternal Grandmother    Breast cancer Cousin 74       paternal cousins   ADD / ADHD Son    Allergies  Allergen Reactions   Ciprofloxacin Rash    Erythema and pruritis around IV site, erythema and rash along vein   Other Rash   Jardiance  [Empagliflozin ]     Increase in Cr    Lisinopril     REACTION: felt bad/ stomach hurt/ weak and tired   Metformin  And Related     GI   Sulfa  Antibiotics Rash   Current Outpatient Medications on File Prior to Visit  Medication Sig Dispense Refill   ACCU-CHEK GUIDE test strip TO CHECK GLUCOSE DAILY AND AS NEEDED FOR TYPE 2 DIABETES 100 strip 1   acetaminophen  (TYLENOL ) 325 MG tablet Take 650 mg by mouth every 6 (six) hours as needed for moderate pain.     atorvastatin  (LIPITOR) 10 MG tablet TAKE 1/2 PILL BY MOUTH EVERY OTHER DAY 24 tablet 1   Blood Glucose Monitoring Suppl (ACCU-CHEK GUIDE) w/Device KIT To check glucose daily and as needed for type 2 diabetes 1 kit 0   Cholecalciferol  (VITAMIN D3) 25 MCG (1000 UT) CAPS Take 1,000 Units by mouth daily.     Continuous Glucose Sensor (FREESTYLE LIBRE 3 PLUS SENSOR) MISC Use to check blood sugar continuously. Change sensor every 15 days. 6 each 3   Cyanocobalamin  (B-12) 1000 MCG TABS Take 1 tablet by mouth daily.     esomeprazole (NEXIUM) 20 MG capsule Take 20 mg by mouth every other day.     estradiol  (ESTRACE ) 0.1 MG/GM vaginal cream Estrogen Cream Instruction Discard applicator Apply pea sized amount to tip of finger to urethra before bed. Wash hands well  after application. Use Monday, Wednesday and Friday 42.5 g 12   fluticasone  (FLONASE ) 50 MCG/ACT nasal spray Place 2 sprays into both nostrils at bedtime.     Lancets (ACCU-CHEK MULTICLIX) lancets To check glucose daily and as needed for diabetes type 2 100 each 3   LORazepam  (ATIVAN ) 1 MG tablet Take  1 mg by mouth at bedtime.     losartan  (COZAAR ) 25 MG tablet Take 25 mg by mouth daily.     Melatonin 1 MG CHEW Chew 1.5 mg by mouth at bedtime as needed.     methenamine  (HIPREX ) 1 g tablet Take 1 tablet (1 g total) by mouth 2 (two) times daily with a meal. 60 tablet 5   metoprolol  tartrate (LOPRESSOR ) 25 MG tablet TAKE 1 TABLET BY MOUTH EVERY DAY 90 tablet 1   metroNIDAZOLE  (METROGEL ) 1 % gel Apply topically daily. 45 g 2   Multiple Vitamin (MULTIVITAMIN) tablet Take 1 tablet by mouth daily.     NON FORMULARY SLEEPS WITH BI-PAP     phenazopyridine  (PYRIDIUM ) 200 MG tablet Take 1 tablet (200 mg total) by mouth 3 (three) times daily as needed for pain. 10 tablet 0   simethicone  (MYLICON) 80 MG chewable tablet Chew 1 tablet (80 mg total) by mouth 4 (four) times daily as needed for flatulence. 30 tablet 0   tirzepatide  (MOUNJARO ) 15 MG/0.5ML Pen INJECT 15 MG INTO THE SKIN ONCE A WEEK. 2 mL 3   zolpidem  (AMBIEN ) 10 MG tablet Take 0.5 tablets (5 mg total) by mouth at bedtime as needed for sleep. Caution of sedation and falls 15 tablet 3   No current facility-administered medications on file prior to visit.    Review of Systems  Constitutional:  Negative for activity change, appetite change, fatigue, fever and unexpected weight change.  HENT:  Negative for congestion, ear pain, rhinorrhea, sinus pressure and sore throat.   Eyes:  Negative for pain, redness and visual disturbance.  Respiratory:  Negative for cough, shortness of breath and wheezing.   Cardiovascular:  Negative for chest pain and palpitations.  Gastrointestinal:  Negative for abdominal distention, abdominal pain, blood in stool, constipation and diarrhea.  Endocrine: Negative for polydipsia and polyuria.  Genitourinary:  Negative for dysuria, frequency and urgency.  Musculoskeletal:  Negative for arthralgias, back pain and myalgias.  Skin:  Negative for pallor and rash.  Allergic/Immunologic: Negative for environmental allergies.   Neurological:  Negative for dizziness, syncope and headaches.  Hematological:  Negative for adenopathy. Does not bruise/bleed easily.  Psychiatric/Behavioral:  Negative for decreased concentration and dysphoric mood. The patient is not nervous/anxious.        Objective:   Physical Exam Constitutional:      General: She is not in acute distress.    Appearance: Normal appearance. She is well-developed. She is obese. She is not ill-appearing or diaphoretic.  HENT:     Head: Normocephalic and atraumatic.  Eyes:     Conjunctiva/sclera: Conjunctivae normal.     Pupils: Pupils are equal, round, and reactive to light.  Neck:     Thyroid : No thyromegaly.     Vascular: No carotid bruit or JVD.  Cardiovascular:     Rate and Rhythm: Normal rate and regular rhythm.     Heart sounds: Normal heart sounds.     No gallop.  Pulmonary:     Effort: Pulmonary effort is normal. No respiratory distress.     Breath sounds: Normal breath sounds. No wheezing or rales.  Abdominal:  General: There is no distension or abdominal bruit.     Palpations: Abdomen is soft.  Musculoskeletal:     Cervical back: Normal range of motion and neck supple.     Right lower leg: No edema.     Left lower leg: No edema.  Lymphadenopathy:     Cervical: No cervical adenopathy.  Skin:    General: Skin is warm and dry.     Coloration: Skin is not pale.     Findings: No rash.  Neurological:     Mental Status: She is alert.     Coordination: Coordination normal.     Deep Tendon Reflexes: Reflexes are normal and symmetric. Reflexes normal.  Psychiatric:        Mood and Affect: Mood normal.           Assessment & Plan:   Problem List Items Addressed This Visit       Cardiovascular and Mediastinum   Essential hypertension   bp in fair control at this time  BP Readings from Last 1 Encounters:  10/31/23 126/74   No changes needed Most recent labs reviewed  Disc lifstyle change with low sodium diet and  exercise  Losartan  25 mg daily  Metoprolol  25 mg daily  Lasix  20 mg bid    Continues nephrology care for ckd Working on weight loss         Digestive   Liver cirrhosis secondary to NASH (HCC)   Seen at [facility] In process of getting into drug trial at [facility]  Working on health habits and weight loss   Had fibro scan          Endocrine   Hyperlipidemia associated with type 2 diabetes mellitus (HCC)   Disc goals for lipids and reasons to control them Rev last labs with pt Rev low sat fat diet in detail Continue atorvastatin  5 mg every other day       DM (diabetes mellitus), type 2 with renal complications (HCC)   Lab Results  Component Value Date   HGBA1C 7.2 (A) 10/31/2023   HGBA1C 7.4 (H) 07/12/2023   HGBA1C 7.2 (A) 04/16/2023   Seeing nephrology      Diabetes mellitus treated with injections of non-insulin  medication (HCC) - Primary   Lab Results  Component Value Date   HGBA1C 7.2 (A) 10/31/2023   HGBA1C 7.4 (H) 07/12/2023   HGBA1C 7.2 (A) 04/16/2023   Moujaro 15 mg weekly  Working with nutritionist at [employer] had too many veggies she could not eat) Encouraged strongly to keep cutting back sweets  Encouraged to gradually increase exercise to 5 d per week as tolerated    Microalb done by nephrology   Taking arb and statin  Follow up 3 mo       Relevant Orders   POCT HgB A1C (Completed)   CKD stage 3 due to type 2 diabetes mellitus (HCC)   GFR 40.9 Continues nephrology care         Other   Morbid obesity (HCC)   Discussed how this problem influences overall health and the risks it imposes  Reviewed plan for weight loss with lower calorie diet (via better food choices (lower glycemic and portion control) along with exercise building up to or more than 30 minutes 5 days per week including some aerobic activity and strength training   On mounjaro   Continues to work on diet/exercise  With nutritionist at [facility]  Has cirrhosis from fatty  liver

## 2023-10-31 NOTE — Assessment & Plan Note (Signed)
 Disc goals for lipids and reasons to control them Rev last labs with pt Rev low sat fat diet in detail Continue atorvastatin  5 mg every other day

## 2023-10-31 NOTE — Assessment & Plan Note (Signed)
 Lab Results  Component Value Date   HGBA1C 7.2 (A) 10/31/2023   HGBA1C 7.4 (H) 07/12/2023   HGBA1C 7.2 (A) 04/16/2023   Moujaro 15 mg weekly  Working with nutritionist at [employer] had too many veggies she could not eat) Encouraged strongly to keep cutting back sweets  Encouraged to gradually increase exercise to 5 d per week as tolerated    Microalb done by nephrology   Taking arb and statin  Follow up 3 mo

## 2023-10-31 NOTE — Assessment & Plan Note (Signed)
 Lab Results  Component Value Date   HGBA1C 7.2 (A) 10/31/2023   HGBA1C 7.4 (H) 07/12/2023   HGBA1C 7.2 (A) 04/16/2023   Seeing nephrology

## 2023-10-31 NOTE — Assessment & Plan Note (Signed)
 GFR 40.9 Continues nephrology care

## 2023-10-31 NOTE — Assessment & Plan Note (Addendum)
 Seen at [facility] In process of getting into drug trial at [facility]  Working on health habits and weight loss   Had fibro scan

## 2023-11-02 DIAGNOSIS — S29012A Strain of muscle and tendon of back wall of thorax, initial encounter: Secondary | ICD-10-CM | POA: Diagnosis not present

## 2023-11-07 ENCOUNTER — Other Ambulatory Visit: Payer: Self-pay | Admitting: Family Medicine

## 2023-11-12 DIAGNOSIS — E119 Type 2 diabetes mellitus without complications: Secondary | ICD-10-CM | POA: Diagnosis not present

## 2023-11-12 DIAGNOSIS — Z932 Ileostomy status: Secondary | ICD-10-CM | POA: Diagnosis not present

## 2023-11-12 DIAGNOSIS — R932 Abnormal findings on diagnostic imaging of liver and biliary tract: Secondary | ICD-10-CM | POA: Diagnosis not present

## 2023-11-12 DIAGNOSIS — K5190 Ulcerative colitis, unspecified, without complications: Secondary | ICD-10-CM | POA: Diagnosis not present

## 2023-11-29 ENCOUNTER — Ambulatory Visit (INDEPENDENT_AMBULATORY_CARE_PROVIDER_SITE_OTHER)

## 2023-11-29 DIAGNOSIS — Z0000 Encounter for general adult medical examination without abnormal findings: Secondary | ICD-10-CM

## 2023-11-29 NOTE — Progress Notes (Signed)
 I connected with  Jamie [name] on 11/29/23 by a audio enabled telemedicine application and verified that I am speaking with the correct person using two identifiers.  Patient Location: Home  Provider Location: Home Office  I discussed the limitations of evaluation and management by telemedicine. The patient expressed understanding and agreed to proceed.   Subjective:   Jamie [name] is a 68 y.o. female who presents for a Medicare Annual Wellness Visit.  Allergies (verified) Ciprofloxacin, Other, Jardiance  [empagliflozin ], Lisinopril, Metformin  and related, and Sulfa  antibiotics   History: Past Medical History:  Diagnosis Date   Allergy 1970s   Sulfa  drugs   Anemia    Arthritis    generalized   Blood transfusion without reported diagnosis 1979   Bowel obstruction (HCC)    repeated (? possible adhesions) neg EGD   Cataract ~2018   Not ready for surgery yet   Chronic kidney disease 05-2020   Cirrhosis of liver (HCC)    asymptomatic.  Visible on scans.   Colitis    with colectomy   Diabetes mellitus without complication (HCC) ~2011   Dyspnea    covid   Gall stones 01/23/2006   Gastroenteritis 07/25/2014   hosp for IVF    GERD (gastroesophageal reflux disease)    History of kidney stones    HTN (hypertension)    Hyperglycemia    mild-monitors A1C   Ileostomy in place [facility])    Mild mitral regurgitation by prior echocardiogram    Obesity    OSA on CPAP    Pneumonia    Rosacea    Sleep apnea    CPAP everynight   Past Surgical History:  Procedure Laterality Date   APPENDECTOMY  08-1991   Along with colon removal   CATARACT EXTRACTION W/PHACO Left 02/06/2023   Procedure: CATARACT EXTRACTION PHACO AND INTRAOCULAR LENS PLACEMENT (IOC) LEFT DIABETIC 5.90 00:35.9;  Surgeon: Jaye Fallow, MD;  Location: [facility];  Service: Ophthalmology;  Laterality: Left;   CATARACT EXTRACTION W/PHACO Right 02/20/2023   Procedure: CATARACT EXTRACTION PHACO AND  INTRAOCULAR LENS PLACEMENT (IOC) RIGHT DIABETIC 5.28 00:47.0;  Surgeon: Jaye Fallow, MD;  Location: [facility];  Service: Ophthalmology;  Laterality: Right;   CHOLECYSTECTOMY  2014   [facility]   COLECTOMY  1993   has ileostomy   COLON SURGERY  2021   removed rectum   COLONOSCOPY  08/1991   COLOSTOMY REVISION  12/06/2011   Procedure: COLOSTOMY REVISION;  Surgeon: Bernarda Ned, MD;  Location: [facility];  Service: General;  Laterality: Right;  OSTOMY revision   CYSTOSCOPY/URETEROSCOPY/HOLMIUM LASER/STENT PLACEMENT Left 04/11/2021   Procedure: CYSTOSCOPY/URETEROSCOPY/HOLMIUM LASER/STENT PLACEMENT;  Surgeon: Penne Knee, MD;  Location: [facility];  Service: Urology;  Laterality: Left;   EXAMINATION UNDER ANESTHESIA  12/06/2011   Procedure: EXAM UNDER ANESTHESIA;  Surgeon: Bernarda Ned, MD;  [facility];  Service: General;  Laterality: N/A;  rectal exam under anesthesia, anal dilation, pouchoscopy     SPINE SURGERY  05/12/2022   2 fractured vertebra   SQUAMOUS CELL CARCINOMA EXCISION     TOTAL ABDOMINAL HYSTERECTOMY  05/2006   for abscessed ovaries   Family History  Problem Relation Age of Onset   Hypertension Mother    Hyperlipidemia Mother    Cirrhosis Mother        NASH   Diabetes Mother    Liver cancer Father        resection secondary to mets   Cancer Father  colon   Hyperlipidemia Father    Hypertension Father    Allergies Father    Ulcerative colitis Father    Arthritis Father    Allergies Brother    Gastric cancer Brother    Cancer Brother    Breast cancer Paternal Grandmother    Breast cancer Cousin 86       paternal cousins   ADD / ADHD Son    Social History   Occupational History   Occupation: Guardian Ad Litem  Tobacco Use   Smoking status: Never   Smokeless tobacco: Never  Vaping Use   Vaping status: Never Used  Substance and Sexual Activity   Alcohol use: No   Drug use: Never   Sexual  activity: Not Currently    Birth control/protection: Surgical   Tobacco Counseling Counseling given: Not Answered  SDOH Screenings   Food Insecurity: No Food Insecurity (11/29/2023)  Housing: Low Risk  (11/29/2023)  Transportation Needs: No Transportation Needs (11/29/2023)  Utilities: Not At Risk (11/29/2023)  Alcohol Screen: Low Risk  (11/29/2023)  Depression (PHQ2-9): Low Risk  (11/29/2023)  Financial Resource Strain: Low Risk  (11/29/2023)  Physical Activity: Sufficiently Active (11/29/2023)  Social Connections: Socially Integrated (11/29/2023)  Stress: No Stress Concern Present (11/29/2023)  Tobacco Use: Low Risk  (11/29/2023)  Health Literacy: Adequate Health Literacy (11/29/2023)   Depression Screen    11/29/2023    2:57 PM 10/31/2023   10:09 AM 04/16/2023    2:31 PM 12/15/2022    8:05 AM 10/12/2022   10:01 AM 10/05/2022    2:36 PM 07/11/2022   11:00 AM  PHQ 2/9 Scores  PHQ - 2 Score 0 0 0 0 0 0 1  PHQ- 9 Score 0 0  0  0  0   5      Data saved with a previous flowsheet row definition     Goals Addressed             This Visit's Progress    Patient Stated       11/29/2023, wants to be more active and watch diet       Visit info / Clinical Intake: Medicare Wellness Visit Type:: Subsequent Annual Wellness Visit Medicare Wellness Visit Mode:: Telephone If telephone:: video declined If telephone or video:: unable to obtan vitals due to lack of equipment Interpreter Needed?: No Pre-visit prep was completed: yes AWV questionnaire completed by patient prior to visit?: yes Date:: 11/29/23 Living arrangements:: lives with spouse/significant other Patient's Overall Health Status Rating: good Typical amount of pain: some Does pain affect daily life?: (!) yes (some) Are you currently prescribed opioids?: no  Dietary Habits and Nutritional Risks How many meals a day?: 2 Eats fruit and vegetables daily?: yes Most meals are obtained by: preparing own meals Diabetic:: (!)  yes Any non-healing wounds?: no How often do you check your BS?: continuous glucose monitor Would you like to be referred to a Nutritionist or for Diabetic Management? : no  Functional Status Activities of Daily Living (to include ambulation/medication): Independent Ambulation: Independent Medication Administration: Independent Home Management: Independent Manage your own finances?: yes Primary transportation is: driving Concerns about vision?: no *vision screening is required for WTM* Concerns about hearing?: no  Fall Screening Falls in the past year?: 1 (fell down steps at church) Number of falls in past year: 0 Was there an injury with Fall?: 1 Fall Risk Category Calculator: 2 Patient Fall Risk Level: Moderate Fall Risk  Fall Risk Patient at Risk for Falls Due  to: Medication side effect Fall risk Follow up: Falls prevention discussed; Falls evaluation completed  Home and Transportation Safety: All rugs have non-skid backing?: yes All stairs or steps have railings?: yes Grab bars in the bathtub or shower?: yes Have non-skid surface in bathtub or shower?: yes Good home lighting?: yes Regular seat belt use?: yes Hospital stays in the last year:: (!) yes How many hospital stays:: 1 Reason: dehydrated  Cognitive Assessment Difficulty concentrating, remembering, or making decisions? : no Will 6CIT or Mini Cog be Completed: yes What year is it?: 0 points What month is it?: 0 points Give patient an address phrase to remember (5 components): [address] About what time is it?: 0 points Count backwards from 20 to 1: 0 points Say the months of the year in reverse: 0 points Repeat the address phrase from earlier: 0 points 6 CIT Score: 0 points  Advance Directives (For Healthcare) Does Patient Have a Medical Advance Directive?: Yes Does patient want to make changes to medical advance directive?: No - Patient declined Type of Advance Directive: Healthcare Power of  [place]; Living will Copy of Healthcare Power of Attorney in Chart?: No - copy requested Copy of Living Will in Chart?: No - copy requested Would patient like information on creating a medical advance directive?: No - Patient declined  Reviewed/Updated  Reviewed/Updated: All        Objective:    Today's Vitals   There is no height or weight on file to calculate BMI.  Current Medications (verified) Outpatient Encounter Medications as of 11/29/2023  Medication Sig   ACCU-CHEK GUIDE test strip TO CHECK GLUCOSE DAILY AND AS NEEDED FOR TYPE 2 DIABETES   acetaminophen  (TYLENOL ) 325 MG tablet Take 650 mg by mouth every 6 (six) hours as needed for moderate pain.   atorvastatin  (LIPITOR) 10 MG tablet TAKE 1/2 PILL BY MOUTH EVERY OTHER DAY   Blood Glucose Monitoring Suppl (ACCU-CHEK GUIDE) w/Device KIT To check glucose daily and as needed for type 2 diabetes   Cholecalciferol  (VITAMIN D3) 25 MCG (1000 UT) CAPS Take 1,000 Units by mouth daily.   Continuous Glucose Sensor (FREESTYLE LIBRE 3 PLUS SENSOR) MISC Use to check blood sugar continuously. Change sensor every 15 days.   Cyanocobalamin  (B-12) 1000 MCG TABS Take 1 tablet by mouth daily.   esomeprazole (NEXIUM) 20 MG capsule Take 20 mg by mouth every other day.   estradiol  (ESTRACE ) 0.1 MG/GM vaginal cream Estrogen Cream Instruction Discard applicator Apply pea sized amount to tip of finger to urethra before bed. Wash hands well after application. Use Monday, Wednesday and Friday   fluticasone  (FLONASE ) 50 MCG/ACT nasal spray Place 2 sprays into both nostrils at bedtime.   Lancets (ACCU-CHEK MULTICLIX) lancets To check glucose daily and as needed for diabetes type 2   LORazepam  (ATIVAN ) 1 MG tablet Take 1 mg by mouth at bedtime.   losartan  (COZAAR ) 25 MG tablet Take 25 mg by mouth daily.   Melatonin 1 MG CHEW Chew 1.5 mg by mouth at bedtime as needed.   methenamine  (HIPREX ) 1 g tablet Take 1 tablet (1 g total) by mouth 2 (two) times daily  with a meal.   metoprolol  tartrate (LOPRESSOR ) 25 MG tablet TAKE 1 TABLET BY MOUTH EVERY DAY   metroNIDAZOLE  (METROGEL ) 1 % gel Apply topically daily.   Multiple Vitamin (MULTIVITAMIN) tablet Take 1 tablet by mouth daily.   NON FORMULARY SLEEPS WITH BI-PAP   phenazopyridine  (PYRIDIUM ) 200 MG tablet Take 1 tablet (  200 mg total) by mouth 3 (three) times daily as needed for pain.   simethicone  (MYLICON) 80 MG chewable tablet Chew 1 tablet (80 mg total) by mouth 4 (four) times daily as needed for flatulence.   tirzepatide  (MOUNJARO ) 15 MG/0.5ML Pen INJECT 15 MG INTO THE SKIN ONCE A WEEK.   zolpidem  (AMBIEN ) 10 MG tablet Take 0.5 tablets (5 mg total) by mouth at bedtime as needed for sleep. Caution of sedation and falls   No facility-administered encounter medications on file as of 11/29/2023.   Hearing/Vision screen Hearing Screening - Comments:: Denies hearing issues Vision Screening - Comments:: Regular eye exams, [facility] Immunizations and Health Maintenance Health Maintenance  Topic Date Due   Diabetic kidney evaluation - Urine ACR  02/07/2027 (Originally 07/14/2010)   FOOT EXAM  04/15/2024   COVID-19 Vaccine (9 - Pfizer risk 2025-26 season) 04/24/2024   HEMOGLOBIN A1C  04/30/2024   Diabetic kidney evaluation - eGFR measurement  07/11/2024   Mammogram  09/06/2024   OPHTHALMOLOGY EXAM  09/12/2024   Medicare Annual Wellness (AWV)  11/28/2024   DTaP/Tdap/Td (4 - Td or Tdap) 11/22/2032   Pneumococcal Vaccine: 50+ Years  Completed   Influenza Vaccine  Completed   DEXA SCAN  Completed   Hepatitis C Screening  Completed   Zoster Vaccines- Shingrix  Completed   Meningococcal B Vaccine  Aged Out   Colonoscopy  Discontinued        Assessment/Plan:  This is a routine wellness examination for Jamie [name].  Patient Care Team: Tower, Laine LABOR, MD as PCP - General Babara Call, MD as Consulting Physician (Hematology and Oncology) Marcelino Gales, MD as Consulting Physician  (Nephrology) Penne Knee, MD (Inactive) as Consulting Physician (Urology) Fate Morna SAILOR, [facility] (Inactive) as Pharmacist (Pharmacist) Isaiah Scrivener, MD as Consulting Physician (Pulmonary Disease)  I have personally reviewed and noted the following in the patient's chart:   Medical and social history Use of alcohol, tobacco or illicit drugs  Current medications and supplements including opioid prescriptions. Functional ability and status Nutritional status Physical activity Advanced directives List of other physicians Hospitalizations, surgeries, and ER visits in previous 12 months Vitals Screenings to include cognitive, depression, and falls Referrals and appointments  No orders of the defined types were placed in this encounter.  In addition, I have reviewed and discussed with patient certain preventive protocols, quality metrics, and best practice recommendations. A written personalized care plan for preventive services as well as general preventive health recommendations were provided to patient.   Ardella FORBES Dawn, LPN   [id number]   Return in 1 year (on 11/28/2024).  After Visit Summary: (MyChart) Due to this being a telephonic visit, the after visit summary with patients personalized plan was offered to patient via MyChart   Nurse Notes: none

## 2023-11-29 NOTE — Patient Instructions (Signed)
 Jamie [name],  Thank you for taking the time for your Medicare Wellness Visit. I appreciate your continued commitment to your health goals. Please review the care plan we discussed, and feel free to reach out if I can assist you further.  Please note that Annual Wellness Visits do not include a physical exam. Some assessments may be limited, especially if the visit was conducted virtually. If needed, we may recommend an in-person follow-up with your provider.  Ongoing Care Seeing your primary care provider every 3 to 6 months helps us  monitor your health and provide consistent, personalized care.   Referrals If a referral was made during today's visit and you haven't received any updates within two weeks, please contact the referred provider directly to check on the status.  Recommended Screenings:  Health Maintenance  Topic Date Due   Medicare Annual Wellness Visit  10/05/2023   Yearly kidney health urinalysis for diabetes  02/07/2027*   Complete foot exam   04/15/2024   COVID-19 Vaccine (9 - Pfizer risk 2025-26 season) 04/24/2024   Hemoglobin A1C  04/30/2024   Yearly kidney function blood test for diabetes  07/11/2024   Breast Cancer Screening  09/06/2024   Eye exam for diabetics  09/12/2024   DTaP/Tdap/Td vaccine (4 - Td or Tdap) 11/22/2032   Pneumococcal Vaccine for age over 85  Completed   Flu Shot  Completed   DEXA scan (bone density measurement)  Completed   Hepatitis C Screening  Completed   Zoster (Shingles) Vaccine  Completed   Meningitis B Vaccine  Aged Out   Colon Cancer Screening  Discontinued  *Topic was postponed. The date shown is not the original due date.       11/29/2023    3:01 PM  Advanced Directives  Does Patient Have a Medical Advance Directive? Yes  Type of Estate Agent of [place];Living will  Does patient want to make changes to medical advance directive? No - Patient declined  Copy of Healthcare Power of Attorney in Chart? No -  copy requested    Vision: Annual vision screenings are recommended for early detection of glaucoma, cataracts, and diabetic retinopathy. These exams can also reveal signs of chronic conditions such as diabetes and high blood pressure.  Dental: Annual dental screenings help detect early signs of oral cancer, gum disease, and other conditions linked to overall health, including heart disease and diabetes.  Please see the attached documents for additional preventive care recommendations.

## 2023-12-02 ENCOUNTER — Other Ambulatory Visit: Payer: Self-pay | Admitting: Family Medicine

## 2023-12-03 NOTE — Telephone Encounter (Signed)
 Last filled on 08/17/23 #2 mL/ 3 refills  3 month f/u scheduled on 02/01/24

## 2023-12-03 NOTE — Progress Notes (Signed)
 Persons participating in visit on 11/29/2023: patient and Ardella Dawn LPN

## 2023-12-10 ENCOUNTER — Ambulatory Visit: Admitting: Physician Assistant

## 2023-12-10 ENCOUNTER — Encounter: Payer: Self-pay | Admitting: Physician Assistant

## 2023-12-10 VITALS — BP 157/74 | HR 86 | Ht 65.0 in | Wt 262.0 lb

## 2023-12-10 DIAGNOSIS — N200 Calculus of kidney: Secondary | ICD-10-CM | POA: Diagnosis not present

## 2023-12-10 DIAGNOSIS — N390 Urinary tract infection, site not specified: Secondary | ICD-10-CM | POA: Diagnosis not present

## 2023-12-10 MED ORDER — METHENAMINE HIPPURATE 1 G PO TABS: 1.0000 g | ORAL_TABLET | Freq: Two times a day (BID) | ORAL | 3 refills | Status: AC

## 2023-12-10 NOTE — Progress Notes (Signed)
 12/10/2023 1:36 PM   Jamie [name] 22-Sep-1955 [id number]  CC: Chief Complaint  Patient presents with   Follow-up   HPI: Jamie [name] is a 68 y.o. female with PMH recurrent UTI on Hiprex , nephrolithiasis, and urinary retention following thoracic spinal fusion, resolved, who presents today for routine follow-up.   Today she reports she has been UTI free on a regimen of topical vaginal estrogen cream 3 times weekly, daily d-mannose/cranberry supplements, and Hiprex  twice daily.  She is very pleased with this regimen and wishes to continue it.  Notably, she was recently diagnosed with cirrhosis and is undergoing workup with [facility] for possible clinical trial enrollment.  She notes that an abdominal ultrasound last week redemonstrated a nonobstructing left renal stone.  She is asymptomatic of this.  PMH: Past Medical History:  Diagnosis Date   Allergy 1970s   Sulfa  drugs   Anemia    Arthritis    generalized   Blood transfusion without reported diagnosis 1979   Bowel obstruction (HCC)    repeated (? possible adhesions) neg EGD   Cataract ~2018   Not ready for surgery yet   Chronic kidney disease 05-2020   Cirrhosis of liver (HCC)    asymptomatic.  Visible on scans.   Colitis    with colectomy   Diabetes mellitus without complication (HCC) ~2011   Dyspnea    covid   Gall stones 01/23/2006   Gastroenteritis 07/25/2014   hosp for IVF    GERD (gastroesophageal reflux disease)    History of kidney stones    HTN (hypertension)    Hyperglycemia    mild-monitors A1C   Ileostomy in place [facility]    Mild mitral regurgitation by prior echocardiogram    Obesity    OSA on CPAP    Pneumonia    Rosacea    Sleep apnea    CPAP everynight    Surgical History: Past Surgical History:  Procedure Laterality Date   APPENDECTOMY  08-1991   Along with colon removal   CATARACT EXTRACTION W/PHACO Left 02/06/2023   Procedure: CATARACT EXTRACTION PHACO AND INTRAOCULAR LENS PLACEMENT  (IOC) LEFT DIABETIC 5.90 00:35.9;  Surgeon: Jaye Fallow, MD;  Location: [facility];  Service: Ophthalmology;  Laterality: Left;   CATARACT EXTRACTION W/PHACO Right 02/20/2023   Procedure: CATARACT EXTRACTION PHACO AND INTRAOCULAR LENS PLACEMENT (IOC) RIGHT DIABETIC 5.28 00:47.0;  Surgeon: Jaye Fallow, MD;  Location: [facility];  Service: Ophthalmology;  Laterality: Right;   CHOLECYSTECTOMY  2014   [facility]   COLECTOMY  1993   has ileostomy   COLON SURGERY  2021   removed rectum   COLONOSCOPY  08/1991   COLOSTOMY REVISION  12/06/2011   Procedure: COLOSTOMY REVISION;  Surgeon: Bernarda Ned, MD;  Location: [facility];  Service: General;  Laterality: Right;  OSTOMY revision   CYSTOSCOPY/URETEROSCOPY/HOLMIUM LASER/STENT PLACEMENT Left 04/11/2021   Procedure: CYSTOSCOPY/URETEROSCOPY/HOLMIUM LASER/STENT PLACEMENT;  Surgeon: Penne Knee, MD;  Location: [facility];  Service: Urology;  Laterality: Left;   EXAMINATION UNDER ANESTHESIA  12/06/2011   Procedure: EXAM UNDER ANESTHESIA;  Surgeon: Bernarda Ned, MD;  Location[facility];  Service: General;  Laterality: N/A;  rectal exam under anesthesia, anal dilation, pouchoscopy     SPINE SURGERY  05/12/2022   2 fractured vertebra   SQUAMOUS CELL CARCINOMA EXCISION     TOTAL ABDOMINAL HYSTERECTOMY  05/2006   for abscessed ovaries    Home Medications:  Allergies as of 12/10/2023  Reactions   Ciprofloxacin Rash   Erythema and pruritis around IV site, erythema and rash along vein   Other Rash   Jardiance  [empagliflozin ]    Increase in Cr    Lisinopril    REACTION: felt bad/ stomach hurt/ weak and tired   Metformin  And Related    GI   Sulfa  Antibiotics Rash        Medication List        Accurate as of December 10, 2023  1:36 PM. If you have any questions, ask your nurse or doctor.          Accu-Chek Guide test strip Generic drug: glucose blood TO CHECK GLUCOSE DAILY  AND AS NEEDED FOR TYPE 2 DIABETES   Accu-Chek Guide w/Device Kit To check glucose daily and as needed for type 2 diabetes   accu-chek multiclix lancets To check glucose daily and as needed for diabetes type 2   acetaminophen  325 MG tablet Commonly known as: TYLENOL  Take 650 mg by mouth every 6 (six) hours as needed for moderate pain.   atorvastatin  10 MG tablet Commonly known as: LIPITOR TAKE 1/2 PILL BY MOUTH EVERY OTHER DAY   B-12 1000 MCG Tabs Take 1 tablet by mouth daily.   esomeprazole 20 MG capsule Commonly known as: NEXIUM Take 20 mg by mouth every other day.   estradiol  0.1 MG/GM vaginal cream Commonly known as: ESTRACE  Estrogen Cream Instruction Discard applicator Apply pea sized amount to tip of finger to urethra before bed. Wash hands well after application. Use Monday, Wednesday and Friday   fluticasone  50 MCG/ACT nasal spray Commonly known as: FLONASE  Place 2 sprays into both nostrils at bedtime.   FreeStyle Libre 3 Plus Sensor Misc Use to check blood sugar continuously. Change sensor every 15 days.   LORazepam  1 MG tablet Commonly known as: ATIVAN  Take 1 mg by mouth at bedtime.   losartan  25 MG tablet Commonly known as: COZAAR  Take 25 mg by mouth daily.   Melatonin 1 MG Chew Chew 1.5 mg by mouth at bedtime as needed.   methenamine  1 g tablet Commonly known as: Hiprex  Take 1 tablet (1 g total) by mouth 2 (two) times daily with a meal.   metoprolol  tartrate 25 MG tablet Commonly known as: LOPRESSOR  TAKE 1 TABLET BY MOUTH EVERY DAY   metroNIDAZOLE  1 % gel Commonly known as: METROGEL  Apply topically daily.   Mounjaro  15 MG/0.5ML Pen Generic drug: tirzepatide  INJECT 15 MG INTO THE SKIN ONCE A WEEK.   multivitamin tablet Take 1 tablet by mouth daily.   NON FORMULARY SLEEPS WITH BI-PAP   phenazopyridine  200 MG tablet Commonly known as: Pyridium  Take 1 tablet (200 mg total) by mouth 3 (three) times daily as needed for pain.   simethicone  80  MG chewable tablet Commonly known as: MYLICON Chew 1 tablet (80 mg total) by mouth 4 (four) times daily as needed for flatulence.   Vitamin D3 25 MCG (1000 UT) Caps Take 1,000 Units by mouth daily.   zolpidem  10 MG tablet Commonly known as: AMBIEN  Take 0.5 tablets (5 mg total) by mouth at bedtime as needed for sleep. Caution of sedation and falls        Allergies:  Allergies  Allergen Reactions   Ciprofloxacin Rash    Erythema and pruritis around IV site, erythema and rash along vein   Other Rash   Jardiance  [Empagliflozin ]     Increase in Cr    Lisinopril     REACTION: felt bad/  stomach hurt/ weak and tired   Metformin  And Related     GI   Sulfa  Antibiotics Rash    Family History: Family History  Problem Relation Age of Onset   Hypertension Mother    Hyperlipidemia Mother    Cirrhosis Mother        NASH   Diabetes Mother    Liver cancer Father        resection secondary to mets   Cancer Father        colon   Hyperlipidemia Father    Hypertension Father    Allergies Father    Ulcerative colitis Father    Arthritis Father    Allergies Brother    Gastric cancer Brother    Cancer Brother    Breast cancer Paternal Grandmother    Breast cancer Cousin 16       paternal cousins   ADD / ADHD Son     Social History:   reports that she has never smoked. She has never used smokeless tobacco. She reports that she does not drink alcohol and does not use drugs.  Physical Exam: BP (!) 157/74   Pulse 86   Ht 5' 5 (1.651 m)   Wt 262 lb (118.8 kg)   BMI 43.60 kg/m   Constitutional:  Alert and oriented, no acute distress, nontoxic appearing HEENT: [place], AT Cardiovascular: No clubbing, cyanosis, or edema Respiratory: Normal respiratory effort, no increased work of breathing Skin: No rashes, bruises or suspicious lesions Neurologic: Grossly intact, no focal deficits, moving all 4 extremities Psychiatric: Normal mood and affect  Assessment & Plan:   1. Recurrent  UTI (Primary) Well-controlled on cranberry/d-mannose supplements, topical vaginal estrogen cream, and Hiprex .  Will continue this regimen. - methenamine  (HIPREX ) 1 g tablet; Take 1 tablet (1 g total) by mouth 2 (two) times daily with a meal.  Dispense: 180 tablet; Refill: 3  2. Nephrolithiasis Nonobstructing left renal stone on recent abdominal ultrasound.  Will continue to monitor.  Return in about 1 year (around 12/09/2024) for Annual follow up.  Lucie Hones, PA-C  [facility] [place] [address], [address] [place], [place] [address] [phone]

## 2023-12-23 DIAGNOSIS — K760 Fatty (change of) liver, not elsewhere classified: Secondary | ICD-10-CM | POA: Diagnosis not present

## 2023-12-23 DIAGNOSIS — K7689 Other specified diseases of liver: Secondary | ICD-10-CM | POA: Diagnosis not present

## 2023-12-24 DIAGNOSIS — R609 Edema, unspecified: Secondary | ICD-10-CM | POA: Diagnosis not present

## 2023-12-24 DIAGNOSIS — N1831 Chronic kidney disease, stage 3a: Secondary | ICD-10-CM | POA: Diagnosis not present

## 2023-12-24 DIAGNOSIS — I10 Essential (primary) hypertension: Secondary | ICD-10-CM | POA: Diagnosis not present

## 2023-12-24 DIAGNOSIS — E1122 Type 2 diabetes mellitus with diabetic chronic kidney disease: Secondary | ICD-10-CM | POA: Diagnosis not present

## 2024-01-03 ENCOUNTER — Ambulatory Visit: Admitting: Internal Medicine

## 2024-01-29 ENCOUNTER — Ambulatory Visit: Admitting: Internal Medicine

## 2024-01-29 ENCOUNTER — Other Ambulatory Visit: Payer: Self-pay

## 2024-01-29 ENCOUNTER — Encounter: Payer: Self-pay | Admitting: Internal Medicine

## 2024-01-29 VITALS — BP 136/84 | HR 93 | Temp 98.0°F | Resp 16 | Ht 65.0 in | Wt 269.4 lb

## 2024-01-29 DIAGNOSIS — N6001 Solitary cyst of right breast: Secondary | ICD-10-CM

## 2024-01-29 MED ORDER — DOXYCYCLINE HYCLATE 100 MG PO TABS: 100.0000 mg | ORAL_TABLET | Freq: Two times a day (BID) | ORAL | 0 refills | Status: AC

## 2024-01-29 NOTE — Progress Notes (Signed)
 "  Acute Office Visit  Subjective:     Patient ID: Jamie [name], female    DOB: [birth year], 69 y.o.   MRN: [id number]  Chief Complaint  Patient presents with   Cyst    Right breast, painful, red and  swollen for 2 weeks    HPI Patient is in today for right breast cyst.   Discussed the use of AI scribe software for clinical note transcription with the patient, who gave verbal consent to proceed.  History of Present Illness Jamie [name] is a 69 year old female with recurrent staph infections who presents with a large, painful breast cyst.  She has had a large, very painful red cyst on her breast for 2 weeks. It has a palpable knot, is extremely tender to touch, has not drained despite warm compresses, Tylenol , and Neosporin, and the overlying skin has peeled. She has no fever.  She has recurrent staph infections since adolescence, usually in friction areas, and has had a similar cyst on the same breast before.  She has cirrhosis of the liver and recently had a liver biopsy that caused bruising on her right side. She is scheduled for an embolization procedure.  She is currently taking an antibiotic for urinary tract infection (name uncertain, starts with M). She has no known allergy to doxycycline .    Review of Systems  Constitutional:  Negative for chills and fever.  Skin:  Positive for rash. Negative for itching.        Objective:    BP 136/84 (Cuff Size: Large)   Pulse 93   Temp 98 F (36.7 C) (Oral)   Resp 16   Ht 5' 5 (1.651 m)   Wt 269 lb 6.4 oz (122.2 kg)   SpO2 99%   BMI 44.83 kg/m  BP Readings from Last 3 Encounters:  01/29/24 136/84  12/10/23 (!) 157/74  10/31/23 126/74   Wt Readings from Last 3 Encounters:  01/29/24 269 lb 6.4 oz (122.2 kg)  12/10/23 262 lb (118.8 kg)  10/31/23 265 lb 4 oz (120.3 kg)      Physical Exam Exam conducted with a chaperone present.  Constitutional:      Appearance: Normal appearance.  HENT:     Head:  Normocephalic and atraumatic.  Eyes:     Conjunctiva/sclera: Conjunctivae normal.  Cardiovascular:     Rate and Rhythm: Normal rate and regular rhythm.  Pulmonary:     Effort: Pulmonary effort is normal.     Breath sounds: Normal breath sounds.  Chest:  Breasts:    Right: Swelling, skin change and tenderness present.     Left: Normal.     Comments: Large nodular cyst on right breast at the 7 o'clock position to the nipple with large base of surround erythema  Skin:    General: Skin is warm and dry.  Neurological:     General: No focal deficit present.     Mental Status: She is alert. Mental status is at baseline.  Psychiatric:        Mood and Affect: Mood normal.        Behavior: Behavior normal.     Media Information     No results found for any visits on 01/29/24.      Assessment & Plan:   Assessment & Plan Inflammatory disorder of breast Large, red, sore cyst with surrounding erythema and inflammation. Differential includes inflammatory breast cancer, but presentation not strongly suggestive. Concern for underlying malignancy due to persistent symptoms  vs. abscess.  - Ordered ultrasound and right diagnostic mammogram. - Prescribed doxycycline  BID for two weeks. - Referred to general surgeon for evaluation and potential drainage. - Advised follow-up with primary care to assess antibiotic response.  - US  LIMITED ULTRASOUND INCLUDING AXILLA RIGHT BREAST; Future - doxycycline  (VIBRA -TABS) 100 MG tablet; Take 1 tablet (100 mg total) by mouth 2 (two) times daily for 10 days.  Dispense: 20 tablet; Refill: 0 - Ambulatory referral to General Surgery - MM 3D DIAGNOSTIC MAMMOGRAM UNILATERAL RIGHT BREAST; Future   Return if symptoms worsen or fail to improve, for keep follow up with PCP on Friday.  Sharyle Fischer, DO   "

## 2024-01-30 ENCOUNTER — Other Ambulatory Visit: Payer: Self-pay | Admitting: Internal Medicine

## 2024-01-30 DIAGNOSIS — N6001 Solitary cyst of right breast: Secondary | ICD-10-CM

## 2024-02-01 ENCOUNTER — Encounter: Payer: Self-pay | Admitting: Family Medicine

## 2024-02-01 ENCOUNTER — Ambulatory Visit: Admitting: Family Medicine

## 2024-02-01 VITALS — BP 128/68 | HR 80 | Temp 97.8°F | Ht 65.0 in | Wt 266.0 lb

## 2024-02-01 DIAGNOSIS — E1122 Type 2 diabetes mellitus with diabetic chronic kidney disease: Secondary | ICD-10-CM | POA: Diagnosis not present

## 2024-02-01 DIAGNOSIS — N1832 Chronic kidney disease, stage 3b: Secondary | ICD-10-CM | POA: Diagnosis not present

## 2024-02-01 DIAGNOSIS — E1169 Type 2 diabetes mellitus with other specified complication: Secondary | ICD-10-CM

## 2024-02-01 DIAGNOSIS — E119 Type 2 diabetes mellitus without complications: Secondary | ICD-10-CM

## 2024-02-01 DIAGNOSIS — K51019 Ulcerative (chronic) pancolitis with unspecified complications: Secondary | ICD-10-CM

## 2024-02-01 DIAGNOSIS — Z7985 Long-term (current) use of injectable non-insulin antidiabetic drugs: Secondary | ICD-10-CM

## 2024-02-01 DIAGNOSIS — Z932 Ileostomy status: Secondary | ICD-10-CM

## 2024-02-01 DIAGNOSIS — N183 Chronic kidney disease, stage 3 unspecified: Secondary | ICD-10-CM

## 2024-02-01 DIAGNOSIS — I10 Essential (primary) hypertension: Secondary | ICD-10-CM

## 2024-02-01 DIAGNOSIS — E785 Hyperlipidemia, unspecified: Secondary | ICD-10-CM | POA: Diagnosis not present

## 2024-02-01 DIAGNOSIS — R198 Other specified symptoms and signs involving the digestive system and abdomen: Secondary | ICD-10-CM | POA: Diagnosis not present

## 2024-02-01 DIAGNOSIS — K7460 Unspecified cirrhosis of liver: Secondary | ICD-10-CM

## 2024-02-01 LAB — POCT GLYCOSYLATED HEMOGLOBIN (HGB A1C): Hemoglobin A1C: 7.4 % — AB (ref 4.0–5.6)

## 2024-02-01 MED ORDER — GLIPIZIDE ER 5 MG PO TB24: 5.0000 mg | ORAL_TABLET | Freq: Every day | ORAL | 3 refills | Status: AC

## 2024-02-01 NOTE — Progress Notes (Unsigned)
 "  Subjective:    Patient ID: Jamie [name], female    DOB: 29-Nov-1955, 69 y.o.   MRN: [id number]  HPI  Wt Readings from Last 3 Encounters:  02/01/24 266 lb (120.7 kg)  01/29/24 269 lb 6.4 oz (122.2 kg)  12/10/23 262 lb (118.8 kg)   44.26 kg/m  Vitals:   02/01/24 0924  BP: 128/68  Pulse: 80  Temp: 97.8 F (36.6 C)  SpO2: 99%   Pt presents for follow up of chronic medical conditions DM2 HTN  Also cyst on right breast    Saw Dr Bernardo at [facility] on 1/6 Per that note Inflammatory disorder of breast Large, red, sore cyst with surrounding erythema and inflammation. Differential includes inflammatory breast cancer, but presentation not strongly suggestive. Concern for underlying malignancy due to persistent symptoms vs. abscess.  - Ordered ultrasound and right diagnostic mammogram. - Prescribed doxycycline  BID for two weeks. - Referred to general surgeon for evaluation and potential drainage. - Advised follow-up with primary care to assess antibiotic response  Has imaging appts for the 19th   Now her cyst finally opened 1 am/now draining  Thinks the antibiotic is helping  Peeled after using neosporin  Less painful/more itchy  Red area is getting smaller    HTN bp is stable today  No cp or palpitations or headaches or edema  No side effects to medicines  BP Readings from Last 3 Encounters:  02/01/24 128/68  01/29/24 136/84  12/10/23 (!) 157/74    Losartan  25 mg daily  Metoprolol  25 mg daily  Lasix  20 mg bid   CKD Under nephrology care  Lab Results  Component Value Date   NA 136 07/12/2023   K 4.3 07/12/2023   CO2 21 07/12/2023   GLUCOSE 190 (H) 07/12/2023   BUN 19 07/12/2023   CREATININE 1.33 (H) 07/12/2023   CALCIUM  9.5 07/12/2023   GFR 40.94 (L) 07/12/2023   EGFR 52.0 10/16/2023   GFRNONAA 39 (L) 01/16/2023    DM2 Diabetes Home sugar results - CGM (has not had one on in over a week)  Prior to that , some ups and downs  No low glucose  readings   Glucose is lower in am 120s-130  Afternoon/evening - after eating - can be up to 200  Usually back to 130s-140s at bedtime   Appetite is suppressed / but is an emotional eater (including boredom) Has had a lifetime of trying to overcome Has seen counselor in past    DM diet  Not been great over holidays -snacking a lot more  Due to food being available   Exercise  [employer] twice weekly/ senior circuit and chair yoga    Mounjaro  15 mg weekly  Was working with nutritionist - had 2 visits and had to cancel 3rd  Discussed more protein and Mediterranean diet / more produce   Took glipizide  in past  Is intol of metformin   Did not tolerate jardiance - caused utis   Today 7.4 Lab Results  Component Value Date   HGBA1C 7.4 (A) 02/01/2024   HGBA1C 7.2 (A) 10/31/2023   HGBA1C 7.4 (H) 07/12/2023   Lab Results  Component Value Date   LABMICR See below: 06/11/2023   LABMICR See below: 05/03/2023   MICROALBUR 1.0 07/13/2009    Lipids Lab Results  Component Value Date   CHOL 129 07/12/2023   HDL 53.10 07/12/2023   LDLCALC 33 07/12/2023   LDLDIRECT 50.0 07/04/2022   TRIG 215.0 (H) 07/12/2023   CHOLHDL 2  07/12/2023        Patient Active Problem List   Diagnosis Date Noted   High output ileostomy (HCC) 02/03/2024   Liver cirrhosis secondary to NASH (HCC) 10/31/2023   DOE (dyspnea on exertion) 07/19/2023   Facial trauma, sequela 04/16/2023   Diabetes mellitus treated with injections of non-insulin  medication (HCC) 12/17/2022   Pedal edema 12/15/2022   Abnormal finding on imaging of liver 10/12/2022   Long-term current use of injectable noninsulin antidiabetic medication 10/12/2022   Panic anxiety syndrome 07/20/2022   Encounter for screening mammogram for breast cancer 07/11/2022   Estrogen deficiency 07/11/2022   Low serum vitamin B12 07/04/2022   Current use of proton pump inhibitor 07/03/2022   Low magnesium  level 06/06/2022   Closed tricolumnar fracture  of lumbar vertebra (HCC) 05/12/2022   Compression fracture of L1 lumbar vertebra (HCC) 05/11/2022   Morbid obesity (HCC) 05/11/2022   Fall at home, initial encounter 05/11/2022   Chronic kidney disease, stage 3a (HCC) 05/11/2022   Closed fracture of first lumbar vertebra (HCC) 05/11/2022   Closed fracture of twelfth thoracic vertebra (HCC) 05/11/2022   Spinal instability, lumbar 05/11/2022   Hearing loss, right 04/07/2022   Abnormal SPEP 04/28/2021   Abnormal EKG 04/26/2021   CKD stage 3 due to type 2 diabetes mellitus (HCC) 05/31/2020   Aortic atherosclerosis 03/01/2020   Dermatitis 07/01/2018   Left shoulder pain 11/30/2017   Palpitations 07/05/2017   Lichen planus 04/09/2017   Hyperlipidemia associated with type 2 diabetes mellitus (HCC) 04/10/2016   Red blood cell antibody positive 07/02/2013   Elevated C-reactive protein (CRP) 05/15/2012   Routine general medical examination at a health care facility 09/24/2011   Apnea 11/14/2010   Stress reaction, emotional 05/02/2010   Compulsive overeater 05/02/2010   Arthritis    H/O small bowel obstruction    KNEE PAIN 10/14/2009   Rosacea 09/12/2007   Essential hypertension 05/15/2007   OSA (obstructive sleep apnea) 12/13/2006   Ulcerative colitis (HCC) 07/12/2006   Past Medical History:  Diagnosis Date   Allergy 1970s   Sulfa  drugs   Anemia    Arthritis    generalized   Blood transfusion without reported diagnosis 1979   Bowel obstruction (HCC)    repeated (? possible adhesions) neg EGD   Cataract ~2018   Not ready for surgery yet   Chronic kidney disease 05-2020   Cirrhosis of liver (HCC)    asymptomatic.  Visible on scans.   Colitis    with colectomy   Diabetes mellitus without complication (HCC) ~2011   Dyspnea    covid   Gall stones 01/23/2006   Gastroenteritis 07/25/2014   hosp for IVF    GERD (gastroesophageal reflux disease)    History of kidney stones    HTN (hypertension)    Hyperglycemia    mild-monitors  A1C   Ileostomy in place [facility])    Mild mitral regurgitation by prior echocardiogram    Obesity    OSA on CPAP    Pneumonia    Rosacea    Sleep apnea    CPAP everynight   Past Surgical History:  Procedure Laterality Date   APPENDECTOMY  08-1991   Along with colon removal   CATARACT EXTRACTION W/PHACO Left 02/06/2023   Procedure: CATARACT EXTRACTION PHACO AND INTRAOCULAR LENS PLACEMENT (IOC) LEFT DIABETIC 5.90 00:35.9;  Surgeon: Jaye Fallow, MD;  Location: [facility];  Service: Ophthalmology;  Laterality: Left;   CATARACT EXTRACTION W/PHACO Right 02/20/2023   Procedure: CATARACT EXTRACTION PHACO  AND INTRAOCULAR LENS PLACEMENT (IOC) RIGHT DIABETIC 5.28 00:47.0;  Surgeon: Jaye Fallow, MD;  Location: [facility];  Service: Ophthalmology;  Laterality: Right;   CHOLECYSTECTOMY  2014   [facility]   COLECTOMY  1993   has ileostomy   COLON SURGERY  2021   removed rectum   COLONOSCOPY  08/1991   COLOSTOMY REVISION  12/06/2011   Procedure: COLOSTOMY REVISION;  Surgeon: Bernarda Ned, MD;  Location: [facility];  Service: General;  Laterality: Right;  OSTOMY revision   CYSTOSCOPY/URETEROSCOPY/HOLMIUM LASER/STENT PLACEMENT Left 04/11/2021   Procedure: CYSTOSCOPY/URETEROSCOPY/HOLMIUM LASER/STENT PLACEMENT;  Surgeon: Penne Knee, MD;  Location: [facility];  Service: Urology;  Laterality: Left;   EXAMINATION UNDER ANESTHESIA  12/06/2011   Procedure: EXAM UNDER ANESTHESIA;  Surgeon: Bernarda Ned, MD;  Location: [facility];  Service: General;  Laterality: N/A;  rectal exam under anesthesia, anal dilation, pouchoscopy     SPINE SURGERY  05/12/2022   2 fractured vertebra   SQUAMOUS CELL CARCINOMA EXCISION     TOTAL ABDOMINAL HYSTERECTOMY  05/2006   for abscessed ovaries   Social History[1] Family History  Problem Relation Age of Onset   Hypertension Mother    Hyperlipidemia Mother    Cirrhosis Mother        NASH   Diabetes Mother     Liver cancer Father        resection secondary to mets   Cancer Father        colon   Hyperlipidemia Father    Hypertension Father    Allergies Father    Ulcerative colitis Father    Arthritis Father    Allergies Brother    Gastric cancer Brother    Cancer Brother    Breast cancer Paternal Grandmother    Breast cancer Cousin 81       paternal cousins   ADD / ADHD Son    Allergies[2] Medications Ordered Prior to Encounter[3]  Review of Systems  Constitutional:  Negative for activity change, appetite change, fatigue, fever and unexpected weight change.  HENT:  Negative for congestion, ear pain, rhinorrhea, sinus pressure and sore throat.   Eyes:  Negative for pain, redness and visual disturbance.  Respiratory:  Negative for cough, shortness of breath and wheezing.   Cardiovascular:  Negative for chest pain and palpitations.  Gastrointestinal:  Negative for abdominal pain, blood in stool, constipation and diarrhea.  Endocrine: Negative for polydipsia and polyuria.  Genitourinary:  Negative for dysuria, frequency and urgency.  Musculoskeletal:  Negative for arthralgias, back pain and myalgias.  Skin:  Negative for pallor and rash.  Allergic/Immunologic: Negative for environmental allergies.  Neurological:  Negative for dizziness, syncope and headaches.  Hematological:  Negative for adenopathy. Does not bruise/bleed easily.  Psychiatric/Behavioral:  Negative for decreased concentration and dysphoric mood. The patient is not nervous/anxious.        Is an emotional eater        Objective:   Physical Exam Constitutional:      General: She is not in acute distress.    Appearance: Normal appearance. She is well-developed. She is obese. She is not ill-appearing or diaphoretic.  HENT:     Head: Normocephalic and atraumatic.  Eyes:     Conjunctiva/sclera: Conjunctivae normal.     Pupils: Pupils are equal, round, and reactive to light.  Neck:     Thyroid : No thyromegaly.      Vascular: No carotid bruit or JVD.  Cardiovascular:     Rate and Rhythm:  Normal rate and regular rhythm.     Heart sounds: Normal heart sounds.     No gallop.  Pulmonary:     Effort: Pulmonary effort is normal. No respiratory distress.     Breath sounds: Normal breath sounds. No wheezing or rales.  Abdominal:     General: There is no distension or abdominal bruit.     Palpations: Abdomen is soft.  Musculoskeletal:     Cervical back: Normal range of motion and neck supple.     Right lower leg: No edema.     Left lower leg: No edema.  Lymphadenopathy:     Cervical: No cervical adenopathy.  Skin:    General: Skin is warm and dry.     Coloration: Skin is not pale.     Findings: No rash.  Neurological:     Mental Status: She is alert.     Coordination: Coordination normal.     Deep Tendon Reflexes: Reflexes are normal and symmetric. Reflexes normal.  Psychiatric:        Mood and Affect: Mood normal.           Assessment & Plan:   Problem List Items Addressed This Visit       Cardiovascular and Mediastinum   Essential hypertension   bp in fair control at this time  BP Readings from Last 1 Encounters:  02/01/24 128/68   No changes needed Most recent labs reviewed  Disc lifstyle change with low sodium diet and exercise  Losartan  25 mg daily  Metoprolol  25 mg daily  Lasix  20 mg bid    Continues nephrology care for ckd Working on weight loss         Digestive   Ulcerative colitis (HCC)   Colectomy with ostomy  No clinical changes        High output ileostomy (HCC)   Pt works to keep up with fluid intake /esp in light of ckd         Endocrine   Hyperlipidemia associated with type 2 diabetes mellitus (HCC)   Disc goals for lipids and reasons to control them Rev last labs with pt Rev low sat fat diet in detail Atorvastatin  5 mg every other day last LDL 50      Relevant Medications   glipiZIDE  (GLUCOTROL  XL) 5 MG 24 hr tablet   RESOLVED: DM (diabetes  mellitus), type 2 with renal complications (HCC)   Relevant Medications   glipiZIDE  (GLUCOTROL  XL) 5 MG 24 hr tablet   Other Relevant Orders   POCT HgB A1C (Completed)   Diabetes mellitus treated with injections of non-insulin  medication (HCC) - Primary   Lab Results  Component Value Date   HGBA1C 7.4 (A) 02/01/2024   HGBA1C 7.2 (A) 10/31/2023   HGBA1C 7.4 (H) 07/12/2023   Lab Results  Component Value Date   LABMICR See below: 06/11/2023   LABMICR See below: 05/03/2023   MICROALBUR 1.0 07/13/2009   Plan follow up 3 mo or earlier if needed   With CKD-under nephrology care  Taking mounjaro  15 mg weekly  (has not helped weight loss as we hoped, there is some emotional eating) Commended on exercise   Will return go glipizide  xl 5 mg daily watching closely for low glucose readings (has CGM)       Relevant Medications   glipiZIDE  (GLUCOTROL  XL) 5 MG 24 hr tablet   CKD stage 3 due to type 2 diabetes mellitus (HCC)   GFR 39 last month - this is  down from 40.9  Continues nephrology care  Working on fluid intake and control of dm and HTN      Relevant Medications   glipiZIDE  (GLUCOTROL  XL) 5 MG 24 hr tablet     Other   Morbid obesity (HCC)   Discussed how this problem influences overall health and the risks it imposes  Reviewed plan for weight loss with lower calorie diet (via better food choices (lower glycemic and portion control) along with exercise building up to or more than 30 minutes 5 days per week including some aerobic activity and strength training   With co morbidities of HTN, DM2, hyperlipidemia and ckd as well as liver cirrhosis   Continues mounjaro  15 mg weekly-has not helped as much as hoped  Struggling with emotional eating /discussed counseling   [facility]       Relevant Medications   glipiZIDE  (GLUCOTROL  XL) 5 MG 24 hr tablet   Other Visit Diagnoses       Cirrhosis of liver without ascites, unspecified hepatic cirrhosis  type (HCC)             [1]  Social History Tobacco Use   Smoking status: Never   Smokeless tobacco: Never  Vaping Use   Vaping status: Never Used  Substance Use Topics   Alcohol use: No   Drug use: Never  [2]  Allergies Allergen Reactions   Ciprofloxacin Rash    Erythema and pruritis around IV site, erythema and rash along vein   Other Rash   Jardiance  [Empagliflozin ]     Increase in Cr    Lisinopril     REACTION: felt bad/ stomach hurt/ weak and tired   Metformin  And Related     GI   Sulfa  Antibiotics Rash  [3]  Current Outpatient Medications on File Prior to Visit  Medication Sig Dispense Refill   ACCU-CHEK GUIDE test strip TO CHECK GLUCOSE DAILY AND AS NEEDED FOR TYPE 2 DIABETES 100 strip 1   acetaminophen  (TYLENOL ) 325 MG tablet Take 650 mg by mouth every 6 (six) hours as needed for moderate pain.     atorvastatin  (LIPITOR) 10 MG tablet TAKE 1/2 PILL BY MOUTH EVERY OTHER DAY 24 tablet 1   Blood Glucose Monitoring Suppl (ACCU-CHEK GUIDE) w/Device KIT To check glucose daily and as needed for type 2 diabetes 1 kit 0   Cholecalciferol  (VITAMIN D3) 25 MCG (1000 UT) CAPS Take 1,000 Units by mouth daily.     Continuous Glucose Sensor (FREESTYLE LIBRE 3 PLUS SENSOR) MISC Use to check blood sugar continuously. Change sensor every 15 days. 6 each 3   Cyanocobalamin  (B-12) 1000 MCG TABS Take 1 tablet by mouth daily.     doxycycline  (VIBRA -TABS) 100 MG tablet Take 1 tablet (100 mg total) by mouth 2 (two) times daily for 10 days. 20 tablet 0   esomeprazole (NEXIUM) 20 MG capsule Take 20 mg by mouth every other day.     estradiol  (ESTRACE ) 0.1 MG/GM vaginal cream Estrogen Cream Instruction Discard applicator Apply pea sized amount to tip of finger to urethra before bed. Wash hands well after application. Use Monday, Wednesday and Friday 42.5 g 12   fluticasone  (FLONASE ) 50 MCG/ACT nasal spray Place 2 sprays into both nostrils at bedtime.     Lancets (ACCU-CHEK MULTICLIX) lancets To  check glucose daily and as needed for diabetes type 2 100 each 3   LORazepam  (ATIVAN ) 1 MG tablet Take 1 mg by mouth at bedtime.  losartan  (COZAAR ) 25 MG tablet Take 25 mg by mouth daily.     Melatonin 1 MG CHEW Chew 1.5 mg by mouth at bedtime as needed.     methenamine  (HIPREX ) 1 g tablet Take 1 tablet (1 g total) by mouth 2 (two) times daily with a meal. 180 tablet 3   metoprolol  tartrate (LOPRESSOR ) 25 MG tablet TAKE 1 TABLET BY MOUTH EVERY DAY 90 tablet 1   metroNIDAZOLE  (METROGEL ) 1 % gel Apply topically daily. 45 g 2   Multiple Vitamin (MULTIVITAMIN) tablet Take 1 tablet by mouth daily.     NON FORMULARY SLEEPS WITH BI-PAP     phenazopyridine  (PYRIDIUM ) 200 MG tablet Take 1 tablet (200 mg total) by mouth 3 (three) times daily as needed for pain. 10 tablet 0   simethicone  (MYLICON) 80 MG chewable tablet Chew 1 tablet (80 mg total) by mouth 4 (four) times daily as needed for flatulence. 30 tablet 0   tirzepatide  (MOUNJARO ) 15 MG/0.5ML Pen INJECT 15 MG INTO THE SKIN ONCE A WEEK. 2 mL 3   zolpidem  (AMBIEN ) 10 MG tablet Take 0.5 tablets (5 mg total) by mouth at bedtime as needed for sleep. Caution of sedation and falls 15 tablet 3   No current facility-administered medications on file prior to visit.   "

## 2024-02-01 NOTE — Patient Instructions (Addendum)
 Keep cyst clean with soap and water  You can continue warm compress Cover loosely with gauze  This should continue to drain and improve   Finish your doxycycline    Touch base next week with how you are    Continue mounjaro    Consider working up to exercise 5 days per week , 30 or more minutes    Keep working on diet   Add glipizide  xl 5 mg daily in the am  If low glucose hold it and let me know   Follow up 3 months for diabetes

## 2024-02-03 DIAGNOSIS — R198 Other specified symptoms and signs involving the digestive system and abdomen: Secondary | ICD-10-CM | POA: Insufficient documentation

## 2024-02-03 NOTE — Assessment & Plan Note (Signed)
 bp in fair control at this time  BP Readings from Last 1 Encounters:  02/01/24 128/68   No changes needed Most recent labs reviewed  Disc lifstyle change with low sodium diet and exercise  Losartan  25 mg daily  Metoprolol  25 mg daily  Lasix  20 mg bid    Continues nephrology care for ckd Working on weight loss

## 2024-02-03 NOTE — Assessment & Plan Note (Signed)
 Discussed how this problem influences overall health and the risks it imposes  Reviewed plan for weight loss with lower calorie diet (via better food choices (lower glycemic and portion control) along with exercise building up to or more than 30 minutes 5 days per week including some aerobic activity and strength training   With co morbidities of HTN, DM2, hyperlipidemia and ckd as well as liver cirrhosis   Continues mounjaro  15 mg weekly-has not helped as much as hoped  Struggling with emotional eating /discussed counseling   [facility]

## 2024-02-03 NOTE — Assessment & Plan Note (Deleted)
 See a/p for DM with injections of non insulin    . Lab Results  Component Value Date   HGBA1C 7.4 (A) 02/01/2024   HGBA1C 7.2 (A) 10/31/2023   HGBA1C 7.4 (H) 07/12/2023   Adding glipizide  Continue mounjaro   Working on lifestyle change

## 2024-02-03 NOTE — Assessment & Plan Note (Signed)
 Lab Results  Component Value Date   HGBA1C 7.4 (A) 02/01/2024   HGBA1C 7.2 (A) 10/31/2023   HGBA1C 7.4 (H) 07/12/2023   Lab Results  Component Value Date   LABMICR See below: 06/11/2023   LABMICR See below: 05/03/2023   MICROALBUR 1.0 07/13/2009   Plan follow up 3 mo or earlier if needed   With CKD-under nephrology care  Taking mounjaro  15 mg weekly  (has not helped weight loss as we hoped, there is some emotional eating) Commended on exercise   Will return go glipizide  xl 5 mg daily watching closely for low glucose readings (has CGM)

## 2024-02-03 NOTE — Assessment & Plan Note (Signed)
 Disc goals for lipids and reasons to control them Rev last labs with pt Rev low sat fat diet in detail Atorvastatin  5 mg every other day last LDL 50

## 2024-02-03 NOTE — Assessment & Plan Note (Signed)
 GFR 39 last month - this is down from 40.9  Continues nephrology care  Working on fluid intake and control of dm and HTN

## 2024-02-03 NOTE — Assessment & Plan Note (Signed)
 Colectomy with ostomy  No clinical changes

## 2024-02-03 NOTE — Assessment & Plan Note (Signed)
 Pt works to keep up with fluid intake /esp in light of ckd

## 2024-02-06 ENCOUNTER — Encounter: Payer: Self-pay | Admitting: Family Medicine

## 2024-02-11 ENCOUNTER — Other Ambulatory Visit

## 2024-02-11 ENCOUNTER — Encounter

## 2024-02-11 MED ORDER — FLUCONAZOLE 150 MG PO TABS: 150.0000 mg | ORAL_TABLET | Freq: Once | ORAL | 0 refills | Status: AC

## 2024-02-15 ENCOUNTER — Ambulatory Visit: Admitting: Surgery

## 2024-02-20 ENCOUNTER — Encounter: Payer: Self-pay | Admitting: Internal Medicine

## 2024-02-20 ENCOUNTER — Ambulatory Visit: Admitting: Internal Medicine

## 2024-02-20 VITALS — BP 90/60 | HR 83 | Temp 98.0°F | Ht 66.0 in | Wt 258.6 lb

## 2024-02-20 DIAGNOSIS — Z6841 Body Mass Index (BMI) 40.0 and over, adult: Secondary | ICD-10-CM

## 2024-02-20 DIAGNOSIS — E669 Obesity, unspecified: Secondary | ICD-10-CM | POA: Diagnosis not present

## 2024-02-20 DIAGNOSIS — G4733 Obstructive sleep apnea (adult) (pediatric): Secondary | ICD-10-CM | POA: Diagnosis not present

## 2024-02-20 NOTE — Patient Instructions (Addendum)
 Need to be using your CPAP every night, minimum of 4-6 hours a night.  Change equipment as directed. Wash your tubing with warm soap and water daily, hang to dry. Wash humidifier portion weekly. Use bottled, distilled water and change daily Be aware of reduced alertness and do not drive or operate heavy machinery if experiencing this or drowsiness.  Exercise encouraged, as tolerated. Healthy weight management discussed.  Avoid or decrease alcohol consumption and medications that make you more sleepy, if possible. Notify if persistent daytime sleepiness occurs even with consistent use of PAP therapy.   Change CPAP supplies... Every month Mask cushions and/or nasal pillows CPAP machine filters Every 3 months Mask frame (not including the headgear) CPAP tubing Every 6 months Mask headgear Chin strap (if applicable) Humidifier water tub   Excellent Job A+ GOLD STAR!!  Continue CPAP as prescribed  Be aware of reduced alertness and do not drive or operate heavy machinery if experiencing this or drowsiness.  Exercise encouraged, as tolerated. Encouraged proper weight management.  Important to get eight or more hours of sleep  Limiting the use of the computer and television before bedtime.  Decrease naps during the day, so night time sleep will become enhanced.  Limit caffeine, and sleep deprivation.    Avoid Allergens and Irritants Avoid secondhand smoke Avoid SICK contacts Recommend  Masking  when appropriate Recommend Keep up-to-date with vaccinations

## 2024-02-20 NOTE — Progress Notes (Signed)
 " [facility] Consultation     Synopsis Establish care for sleep apnea several years ago Current settings are her curve 10 V auto; BiPAP 19/10   Home sleep study from the December 20th 2012:AHI 37.  CC Follow-up assessment for OSA   HPI:   Discussed sleep data and reviewed with patient.  Encouraged proper weight management.  Discussed sleep hygiene Patient uses and benefits from therapy Using CPAP nightly and with naps Settings are comfortable and is sleeping well. Currently on BiPAP IPAP 19 EPAP 10 Compliance data reviewed in detail Previous AHI was 37  AHI reduced to 1  Last office visit patient had facial trauma and unable to wear her BiPAP Unable to wear BiPAP due to facial sensitivity Patient now wearing F 30I AirFit mask medium size  This has significantly improved her quality of sleep Decrease leaks increase compliance increased usage and hours Patient is at 100% compliant Patient feels much better with the new mask  I have explained the importance of compliance as she has severe sleep apnea with AHI of 37  Patient does have ileostomy that she takes care of at night Patient with a new diagnosis of liver cirrhosis being evaluated at [facility] will be enrolled in the trial  BP 90/60   Pulse 83   Temp 98 F (36.7 C)   Ht 5' 6 (1.676 m)   Wt 258 lb 9.6 oz (117.3 kg)   SpO2 99%   BMI 41.74 kg/m   Alert awake no respiratory distress No wheezing no rhonchi S1-S2 no murmurs No peripheral edema No focal deficits     Assessment and Plan:  69 year old pleasant white female seen today for assessment and diagnosis of SEVERE OSA AHI 37   Assessment of OSA Previous AHI 37 Continue CPAP as prescribed  Excellent compliance report Reviewed compliance report in detail with patient Patient definitely benefits the use of CPAP therapy as prescribed Using CPAP nightly and with naps Pressure setting is comfortable and is sleeping well. Currently on  BiPAP IPAP 19 EPAP 10 AirFit F30i medium mask AHI reduced to 1  No evidence of acute heart failure at this time No respiratory distress No fevers, chills, nausea, vomiting, diarrhea No evidence hemoptysis  Patient Instructions Continue to use CPAP every night, minimum of 4-6 hours a night.  Change equipment every 30 days or as directed by DME.  Wash your tubing with warm soap and water daily, hang to dry.  Wash humidifier portion weekly. Use bottled, distilled water and change daily  Risk of untreated sleep apnea including cardiac arrhthymias, stroke, DM, pulm HTN.   Obesity -recommend significant weight loss -recommend changing diet  Deconditioned state -Recommend increased daily activity and exercise   MEDICATION ADJUSTMENTS/LABS AND TESTS ORDERED: Excellent compliance report continue CPAP as prescribed Continue with the AirFit F30 medium mask Recommend weight loss   CURRENT MEDICATIONS REVIEWED AT LENGTH WITH PATIENT TODAY   Patient  satisfied with Plan of action and management. All questions answered   Follow up 6 months   I spent a total of 42 minutes dedicated to the care of this patient on the date of this encounter to include pre-visit review of records, face-to-face time with the patient discussing conditions above, post visit ordering of testing, clinical documentation with the electronic health record, making appropriate referrals as documented, and communicating necessary information to the patient's healthcare team.    The Patient requires high complexity decision making for assessment and support, frequent evaluation and titration  of therapies, application of advanced monitoring technologies and extensive interpretation of multiple databases.  Patient satisfied with Plan of action and management. All questions answered    Nickolas Alm Cellar, M.D.  [facility]  Medical Director [facility]        "

## 2024-02-23 ENCOUNTER — Other Ambulatory Visit: Payer: Self-pay | Admitting: Family Medicine

## 2024-04-29 ENCOUNTER — Ambulatory Visit: Admitting: Physician Assistant

## 2024-05-01 ENCOUNTER — Ambulatory Visit: Admitting: Family Medicine

## 2024-12-09 ENCOUNTER — Ambulatory Visit: Admitting: Physician Assistant
